# Patient Record
Sex: Female | Born: 1940 | Race: White | Hispanic: No | Smoking: Never smoker
Health system: Southern US, Community
[De-identification: ages and names within clinical notes are randomized; demographics above are authoritative.]

## PROBLEM LIST (undated history)

## (undated) DIAGNOSIS — M1990 Unspecified osteoarthritis, unspecified site: Secondary | ICD-10-CM

## (undated) DIAGNOSIS — Z8709 Personal history of other diseases of the respiratory system: Secondary | ICD-10-CM

## (undated) DIAGNOSIS — I509 Heart failure, unspecified: Secondary | ICD-10-CM

## (undated) DIAGNOSIS — K219 Gastro-esophageal reflux disease without esophagitis: Secondary | ICD-10-CM

## (undated) DIAGNOSIS — Z860100 Personal history of colon polyps, unspecified: Secondary | ICD-10-CM

## (undated) DIAGNOSIS — F419 Anxiety disorder, unspecified: Secondary | ICD-10-CM

## (undated) DIAGNOSIS — G5600 Carpal tunnel syndrome, unspecified upper limb: Secondary | ICD-10-CM

## (undated) DIAGNOSIS — R600 Localized edema: Secondary | ICD-10-CM

## (undated) DIAGNOSIS — M2550 Pain in unspecified joint: Secondary | ICD-10-CM

## (undated) DIAGNOSIS — IMO0001 Reserved for inherently not codable concepts without codable children: Secondary | ICD-10-CM

## (undated) DIAGNOSIS — J45909 Unspecified asthma, uncomplicated: Secondary | ICD-10-CM

## (undated) DIAGNOSIS — R609 Edema, unspecified: Secondary | ICD-10-CM

## (undated) DIAGNOSIS — E041 Nontoxic single thyroid nodule: Secondary | ICD-10-CM

## (undated) DIAGNOSIS — I272 Pulmonary hypertension, unspecified: Secondary | ICD-10-CM

## (undated) DIAGNOSIS — J679 Hypersensitivity pneumonitis due to unspecified organic dust: Secondary | ICD-10-CM

## (undated) HISTORY — PX: EYE SURGERY: SHX253

## (undated) HISTORY — DX: Unspecified asthma, uncomplicated: J45.909

## (undated) HISTORY — PX: ESOPHAGOGASTRODUODENOSCOPY: SHX1529

## (undated) HISTORY — PX: UMBILICAL HERNIA REPAIR: SHX196

## (undated) HISTORY — PX: OTHER SURGICAL HISTORY: SHX169

## (undated) HISTORY — PX: COLONOSCOPY: SHX174

## (undated) HISTORY — PX: KNEE ARTHROSCOPY W/ MENISCAL REPAIR: SHX1877

## (undated) HISTORY — DX: Nontoxic single thyroid nodule: E04.1

## (undated) HISTORY — DX: Carpal tunnel syndrome, unspecified upper limb: G56.00

## (undated) HISTORY — PX: BREAST CYST EXCISION: SHX579

## (undated) HISTORY — DX: Gastro-esophageal reflux disease without esophagitis: K21.9

## (undated) HISTORY — DX: Pain in unspecified joint: M25.50

---

## 1997-11-06 ENCOUNTER — Other Ambulatory Visit: Admission: RE | Admit: 1997-11-06 | Discharge: 1997-11-06 | Payer: Self-pay | Admitting: Obstetrics and Gynecology

## 1998-11-30 ENCOUNTER — Ambulatory Visit (HOSPITAL_BASED_OUTPATIENT_CLINIC_OR_DEPARTMENT_OTHER): Admission: RE | Admit: 1998-11-30 | Discharge: 1998-11-30 | Payer: Self-pay | Admitting: General Surgery

## 1999-08-23 ENCOUNTER — Emergency Department (HOSPITAL_COMMUNITY): Admission: EM | Admit: 1999-08-23 | Discharge: 1999-08-24 | Payer: Self-pay | Admitting: Emergency Medicine

## 1999-10-15 ENCOUNTER — Ambulatory Visit (HOSPITAL_COMMUNITY): Admission: RE | Admit: 1999-10-15 | Discharge: 1999-10-15 | Payer: Self-pay | Admitting: *Deleted

## 2004-04-21 ENCOUNTER — Ambulatory Visit: Payer: Self-pay | Admitting: *Deleted

## 2004-05-20 ENCOUNTER — Ambulatory Visit: Payer: Self-pay | Admitting: *Deleted

## 2007-02-06 ENCOUNTER — Ambulatory Visit: Payer: Self-pay | Admitting: Internal Medicine

## 2007-02-06 LAB — CONVERTED CEMR LAB
Basophils Absolute: 0.1 10*3/uL (ref 0.0–0.1)
Basophils Relative: 0.8 % (ref 0.0–1.0)
Eosinophils Absolute: 0.4 10*3/uL (ref 0.0–0.6)
Eosinophils Relative: 5.1 % — ABNORMAL HIGH (ref 0.0–5.0)
HCT: 39.8 % (ref 36.0–46.0)
Hemoglobin: 13.7 g/dL (ref 12.0–15.0)
Lymphocytes Relative: 35.8 % (ref 12.0–46.0)
MCHC: 34.6 g/dL (ref 30.0–36.0)
MCV: 88.9 fL (ref 78.0–100.0)
Monocytes Absolute: 0.8 10*3/uL — ABNORMAL HIGH (ref 0.2–0.7)
Monocytes Relative: 10.8 % (ref 3.0–11.0)
Neutro Abs: 3.5 10*3/uL (ref 1.4–7.7)
Neutrophils Relative %: 47.5 % (ref 43.0–77.0)
Platelets: 296 10*3/uL (ref 150–400)
Pro B Natriuretic peptide (BNP): 22 pg/mL (ref 0.0–100.0)
RBC: 4.47 M/uL (ref 3.87–5.11)
RDW: 13.1 % (ref 11.5–14.6)
Sed Rate: 14 mm/hr (ref 0–25)
WBC: 7.5 10*3/uL (ref 4.5–10.5)

## 2007-03-19 ENCOUNTER — Ambulatory Visit: Payer: Self-pay | Admitting: Internal Medicine

## 2007-03-19 LAB — CONVERTED CEMR LAB
BUN: 9 mg/dL (ref 6–23)
CO2: 32 meq/L (ref 19–32)
Calcium: 9.1 mg/dL (ref 8.4–10.5)
Chloride: 106 meq/L (ref 96–112)
Creatinine, Ser: 0.7 mg/dL (ref 0.4–1.2)
GFR calc Af Amer: 107 mL/min
GFR calc non Af Amer: 89 mL/min
Glucose, Bld: 94 mg/dL (ref 70–99)
Potassium: 4.1 meq/L (ref 3.5–5.1)
Sodium: 145 meq/L (ref 135–145)

## 2007-03-21 ENCOUNTER — Ambulatory Visit: Payer: Self-pay | Admitting: Cardiovascular Disease

## 2007-04-03 DIAGNOSIS — K219 Gastro-esophageal reflux disease without esophagitis: Secondary | ICD-10-CM | POA: Insufficient documentation

## 2007-04-03 DIAGNOSIS — E785 Hyperlipidemia, unspecified: Secondary | ICD-10-CM | POA: Insufficient documentation

## 2007-04-05 ENCOUNTER — Ambulatory Visit (HOSPITAL_COMMUNITY): Admission: RE | Admit: 2007-04-05 | Discharge: 2007-04-05 | Payer: Self-pay | Admitting: Internal Medicine

## 2007-04-19 ENCOUNTER — Ambulatory Visit: Payer: Self-pay | Admitting: Internal Medicine

## 2007-04-19 LAB — CONVERTED CEMR LAB: TSH: 1.25 microintl units/mL (ref 0.35–5.50)

## 2007-05-01 ENCOUNTER — Telehealth (INDEPENDENT_AMBULATORY_CARE_PROVIDER_SITE_OTHER): Payer: Self-pay | Admitting: *Deleted

## 2007-05-08 ENCOUNTER — Encounter: Admission: RE | Admit: 2007-05-08 | Discharge: 2007-05-08 | Payer: Self-pay | Admitting: Internal Medicine

## 2007-05-11 ENCOUNTER — Encounter: Admission: RE | Admit: 2007-05-11 | Discharge: 2007-05-11 | Payer: Self-pay | Admitting: Internal Medicine

## 2007-05-11 ENCOUNTER — Other Ambulatory Visit: Admission: RE | Admit: 2007-05-11 | Discharge: 2007-05-11 | Payer: Self-pay | Admitting: Interventional Radiology

## 2007-05-11 ENCOUNTER — Encounter (INDEPENDENT_AMBULATORY_CARE_PROVIDER_SITE_OTHER): Payer: Self-pay | Admitting: Interventional Radiology

## 2007-05-18 ENCOUNTER — Telehealth (INDEPENDENT_AMBULATORY_CARE_PROVIDER_SITE_OTHER): Payer: Self-pay | Admitting: *Deleted

## 2007-07-09 ENCOUNTER — Ambulatory Visit: Payer: Self-pay | Admitting: Internal Medicine

## 2007-07-09 ENCOUNTER — Encounter: Payer: Self-pay | Admitting: Internal Medicine

## 2007-07-09 DIAGNOSIS — M545 Low back pain, unspecified: Secondary | ICD-10-CM | POA: Insufficient documentation

## 2007-07-09 DIAGNOSIS — R0602 Shortness of breath: Secondary | ICD-10-CM | POA: Insufficient documentation

## 2007-07-09 DIAGNOSIS — J8410 Pulmonary fibrosis, unspecified: Secondary | ICD-10-CM | POA: Insufficient documentation

## 2007-07-09 LAB — PULMONARY FUNCTION TEST

## 2008-03-04 ENCOUNTER — Encounter: Admission: RE | Admit: 2008-03-04 | Discharge: 2008-03-27 | Payer: Self-pay | Admitting: Family Medicine

## 2008-03-31 ENCOUNTER — Encounter: Admission: RE | Admit: 2008-03-31 | Discharge: 2008-06-02 | Payer: Self-pay | Admitting: Family Medicine

## 2010-07-04 LAB — CONVERTED CEMR LAB
Bilirubin Urine: NEGATIVE
Hemoglobin, Urine: NEGATIVE
Ketones, ur: NEGATIVE mg/dL
Leukocytes, UA: NEGATIVE
Nitrite: NEGATIVE
Specific Gravity, Urine: <=1.005 (ref 1.000–1.03)
Total Protein, Urine: NEGATIVE mg/dL
Urine Glucose: NEGATIVE mg/dL
Urobilinogen, UA: 0.2 (ref 0.0–1.0)
pH: 6.5 (ref 5.0–8.0)

## 2010-07-08 NOTE — Assessment & Plan Note (Signed)
Summary: f/u/tfw   Visit Type:  Extended fu PCP:  Gus Height  Chief Complaint:  Pt here for rov with pft's today.  Overall breathing is well.[name]  History of Present Illness: 70 year old white female never smoker with possible pulmonary fibrosis associated symptomatically with "spells of dyspnea" that have happened for several years and this time after it occurred, did not return completely back to baseline when I saw her on February 06, 2007, and recommended an empiric trial of PPI therapy. I thought a unifying diagnosis might be intermittent reflux causing exacerbations of both airway upper and lower airways symptoms and strongly recommend she maintain PPI therapy as well as diet.  She comes in today chewing gum and admitting that she has not been consistent about using her PPI as recommended.however, compared to how she was doing prior to her visit in September she states she doing quite a bit better.  She denies any limiting dyspnea but did note that she felt just a little bit better after receiving albuterol in the PFT lab today, although she was nonspecific and terms of what way she felt better.  She denies any limiting dyspnea with the activities that she does daily, which does not include intense exertion or aerobics.Pt denies any significant sore throat, nasal congestion or excess secretions, fever, chills, sweats, unintended wt loss, pleuritic or exertional cp, orthopnea pnd or leg swelling.   Pt also denies any obvious fluctuation in symptoms with weather or environmental change or other alleviating or aggravating factors.     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       Family History:    tuberculosis in her father with multiple subsequent skin test by the patient negative    Alzheimer's and her mother chronic lung disease and several siblings who were smokers  Social History:    Patient never smoked.    Risk Factors:  Tobacco use:  never   Review of Systems  The patient denies anorexia, fever, weight loss, vision loss, decreased hearing, hoarseness, chest pain, syncope, dyspnea on exhertion, peripheral edema, prolonged cough, hemoptysis, abdominal pain, melena, hematochezia, severe indigestion/heartburn, hematuria, incontinence, muscle weakness, suspicious skin lesions, transient blindness, difficulty walking, depression, unusual weight change, abnormal bleeding, and enlarged lymph nodes.     Vital Signs:  Patient Profile:   70 Years Old Female Height:     67 inches Weight:      246 pounds O2 Sat:      97 % O2 treatment:    Room Air Temp:     98.4 degrees F oral Pulse rate:   80 / minute BP sitting:   142 / 80  (left arm)  Vitals Entered By: Tilden Dome (July 09, 2007 11:20 AM)                 Physical Exam  ambulatory  obese white female who cleared her throat frequently during the interview and exam and she demonstrated classic voice fatigue during the interview while chewing mint based gum. Afeb with normal vital signs HEENT: nl dentition, turbinates, and orophanx. Nl external ear canals without cough reflex Neck without JVD/Nodes/TM Lungs clear to A and P bilaterally without cough on insp or exp maneuvers RRR no s3 or murmur or increase in P2 Abd soft and benign with nl excursion in the supine position. 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  The fact she has a variable component of reversible airflow traction combined with definite response while on PPI therapy daily, combined with non-adherence to diet, all spells out a story that is very suggestive of intermittent chronic reflux, a very common condition and cause both upper and lower airway dysfunction as well as pulmonary fibrosis.  To prove this hypothesis correct I've asked her to be perfectly consistent about using Nexium every day 30 to 60 minutes before breakfast and observing carefully the diet that are reviewed with her.  This includes avoidance of all medications and mental products. I would like her to do this for a full 3 months before returning here.  She continues to have intermittent spells while on this therapy, I would not hesitate to start her on either Qvar or Symbicort, based on her positive reversible airflow traction demonstrated then affected these two inhalers are the least irritating to the upper airway. Orders: Est. Patient Level IV (19012)   Problem # 2:  LOW BACK PAIN SYNDROME (ICD-724.2) urinalysis was negative.  The patient mentioned this complaint as she was leaving and states that she feels she pulled a muscle left low back but wanted to be sure she did not have a urinary tract infection or stone.  I will pass this information along to Dr. Harrington Challenger Orders: TLB-Udip ONLY (81003-UDIP)   Other Orders: Est. Patient Level IV (22411)   Patient Instructions: 1)  Please schedule a follow-up appointment in 3 months with a chest xray to follow up lung scarring    ]  Appended Document: f/u/tfw Copy will be faxed to Dr. Harrington Challenger

## 2010-07-08 NOTE — Progress Notes (Signed)
Summary: QUESTIONS BEFORE UPCOMING BIOPSY AND LAB RESULTS  Phone Note Call from Patient   Caller: Patient Call For: WERT Summary of Call: PT IS HAVING BIOPSY AT [place] (NODULE ON THYROID - DR. WERT SET THIS UP FOR HER). SHE HAS QUESTIONS REGARDING THIS PROCEDURE AND ALSO WANTS TO KNOW HER RECENT LAB RESULTS. PH# [phone]. PATIENT'S CHART HAS BEEN REQUESTED Initial call taken by: Cooper Render,  May 01, 2007 11:10 AM  Follow-up for Phone Call        Spoke with pt wanted to know address and location of scheduled appt for FNA of her thyroid nodule. Advised scheduled at [facility].  Pt wanting to know who would be doing procedure advised either radiologist or PA gave pt # to [place] and advised her to call and could find out for sure.  Advised FastTSH done on 04/19/07 WNL. Follow-up by: Freddrick March,  May 01, 2007 3:46 PM

## 2010-07-08 NOTE — Miscellaneous (Signed)
Summary: Orders Update-PFTS  Clinical Lists Changes  Problems: Added new problem of DYSPNEA (ICD-786.05) Orders: Added new Service order of Carbon Monoxide diffusing w/capacity [phone]) - Signed Added new Service order of Lung Volumes (47654) - Signed Added new Service order of Spirometry (Pre & Post) [phone]) - Signed

## 2010-07-08 NOTE — Progress Notes (Signed)
Summary: biopsy results - pt wants call back  Phone Note Call from Patient Call back at Home Phone [phone]   Caller: Patient Call For: wert Reason for Call: Talk to Nurse, Lab or Test Results Summary of Call: pt looking for biopsy results.  Would like a call back today either way chart in wert area.  .................................................................Marland KitchenMarland KitchenZigmund Gottron  May 18, 2007 9:35 AM  Follow-up for Phone Call        Dr. Melvyn Novas, please call pt with bronch results.  She is anxious.  She is aware that you are out of the office until 03-21-07. Follow-up by: Tilden Dome,  May 18, 2007 11:57 AM  Additional Follow-up for Phone Call Additional follow up Details #1::        thyroid bx benign.  Se  Send copy of path report to Dr Loletha Grayer Melinda Crutch and set pt up for fu ov @ 3 mo with cxr and pfts dx 515 Additional Follow-up by: Tanda Rockers MD,  May 18, 2007 2:24 PM    Additional Follow-up for Phone Call Additional follow up Details #2::    Pt scheduled for pft and ov 07/09/07. Pt aware. Path report sent. Follow-up by: June Leap,  May 18, 2007 3:37 PM

## 2010-10-19 NOTE — Assessment & Plan Note (Signed)
[facility]                             PULMONARY OFFICE NOTE   [name], [name]                    MRN:          [id number]  DATE:02/06/2007                            DOB:          11-10-1940    REASON FOR CONSULTATION:  Dyspnea.   HISTORY:  A 70 year old white female with paroxysms of dyspnea dating  back several years, typically occurring during spells several times  per year, associated with chest tightness and dry cough.  She said she  had a typical episode a month ago and is about 75% improved but not  completely.  She continues to have somewhat of a dry daytime cough and  dyspnea with anything more than slow ADLs.  She was tried on inhalers,  which didn't work, and there is concern because her father had  tuberculosis that she is developing the same problems that he had.   The patient denies any fevers, chills, sweats, purulent sputum,  orthopnea, PND, overt sinus or reflux symptoms.  Past medical history is  significant, however, for reflux, status post EGD with stretching by Dr.  Michail Sermon within the last month, for which she was given Nexium but  didn't need to keep taking it.  She also has had knee surgery.   ALLERGIES:  SULFA.   MEDICATIONS:  None regularly.   SOCIAL HISTORY:  She has never smoked except as a teenager.   FAMILY HISTORY:  Significant for the fact that her father had  tuberculosis and several of his sisters were chronically ill with  respiratory disease, but they were smokers.  Other than her father, no  one in her direct family has had respiratory diseases.  She was skin-  tested then and every time checks negative and has never been treated  for tuberculosis.   REVIEW OF SYSTEMS:  Taken in detail on the worksheet and negative except  as outlined above.   PHYSICAL EXAMINATION:  This is a slightly anxious but pleasant  ambulatory white female in no acute distress.  She is afebrile with normal vital signs.  HEENT:  Oropharynx clear.  Dentition intact.  NECK:  Supple without cervical adenopathy or tenderness.  Trachea is  midline.  No thyromegaly.  Lung fields reveal slight coarsening of expiratory breath sounds but no  true wheeze.  There is no definite crackle on inspiration.  There is a regular rhythm without murmur, gallop, or rub or increase in  P2.  ABDOMEN:  Soft, benign, without any palpable organomegaly or mass.  EXTREMITIES:  Warm without calf tenderness, cyanosis, clubbing or edema.   Chest x-ray is reviewed from August 23 and she has a mild increase in  markings versus February 2007 chest x-ray, which is normal.   IMPRESSION:  1. This patient appears to have very mild fibrotic-looking changes on      chest x-ray but it does not really correlate with her history of      intermittent spells that occur with spells except for the fact      she is not 100% back to her baseline presently.  One unifying  diagnosis might be gastroesophageal reflux disease because she also      has enough esophagitis to warrant Nexium, albeit not long-term, and      because reflux and fibrosis are frequently related statistically if      not by cause and effect.   I did recommend a sedimentation rate, BNP and CBC with differential  today for baseline purposes and also a set of PFTs with a chest x-ray  here at 6 weeks.  In the meantime I would recommend she take Nexium not  p.r.n. but daily 30 minutes before breakfast and observe a GERD diet  closely.   The differential diagnosis, unfortunately, does include UIP.  Patients  with UIP frequently exacerbate several times per year during viral  syndromes, which is what the patient previously attributed her previous  flare-ups to, but this was the first time she did not return completely  to baseline symptomatically and therefore I think it is unlikely this is  UIP or any other form of progressive pulmonary fibrosis.     [name]. Melvyn Novas, MD, [facility]   Electronically Signed    MBW/MedQ  DD: 02/06/2007  DT: 02/07/2007  Job #: [id number]   cc:   C. Melinda Crutch, M.D.

## 2010-10-19 NOTE — Assessment & Plan Note (Signed)
[facility]                             PULMONARY OFFICE NOTE   [name], [name]                    MRN:          [id number]  DATE:03/19/2007                            DOB:          [birth year]    HISTORY:  This 70 year old white female with possible pulmonary fibrosis  associated symptomatically with spells of dyspnea that have happened  for several years and this time after it occurred, did not return  completely back to baseline when I saw her on February 06, 2007, and  recommended an empiric trial of PPI therapy.  She states she has been  consistent about doing this and also following diet, but only a little  bit better.  In addition, today she tells me she has been having aches  and stiffness in her joints for one year, but this is asymmetric,  sometimes in her shoulders, sometimes in her wrists and almost always in  her knees.  She denies any classic gel phenomenon or symmetric  stiffness in the mornings, fevers, chills, sweats, sputum production,  orthopnea, PND, leg swelling or change in bowel or bladder habits.   PHYSICAL EXAMINATION:  GENERAL:  She is a somewhat anxious, pleasant,  ambulatory obese white female, in no acute distress weighing 247 pounds  now, versus 244 pounds on February 06, 2007, identifying obesity as a  potential risk factor for reflux.  VITAL SIGNS:  Normal.  HEENT:  Unremarkable.  Pharynx clear.  LUNGS:  Fields reveal a cough on deep inspiratory maneuvers but no  definite crackles.  HEART:  A regular rhythm without murmur, gallop or rub.  ABDOMEN:  Soft, benign.  EXTREMITIES:  Warm without calf tenderness, cyanosis, clubbing or edema.   LABORATORY DATA:  From February 06, 2007, indicates a sedimentation rate  of 14.  Beta natriuretic peptide 22.  PFTs today are normal except a  reduction in diffusion capacity at 60%, which corrects to 120.  The most  dramatic finding is a reduction in expiratory reserve  volume, consistent  with the effects of obesity.   IMPRESSION/PLAN:  1. Obesity clearly is the most identifiable problem that both      contributes to cough and dyspnea, but of course does not occur in      spells.  What I am concerned about is the possibility that she      has underlying pulmonary fibrosis that indeed can occur with      spells of dyspnea that do not return to baseline and are      reflected in a reduced diffusion capacity, and recent chest x-ray      suggesting increased interstitial markings.  I therefore recommend      proceeding with a CT scan of the chest and sinuses, (looking for      other possible causes of cough, other than interstitial lung      disease), as soon as we can schedule it.  In the meantime, spend      extra time explaining to her the differential diagnosis and trying      to sort out whether or not collagen  vascular disease is present      (based on the sed rate being so low and the asymmetry of her      complaints without symmetrical typical joint stiffness, I think      collagen vascular disease is unlikely, but has not been excluded      here).  2. She does apparently have documented reflux.  She previously was      told only to take the PPI for one month.  Based on the fact that      she has a chronic lung problem that may be either caused by or      exacerbated by reflux, I am going to recommend that she maintain      the      PPI indefinitely until we have a firm baseline of serial PFTs and      we have excluded the possibility of significant fibrosing illness      here.     [name]. Joy Novas, MD, [facility]  Electronically Signed    MBW/MedQ  DD: 03/19/2007  DT: 03/20/2007  Job #: [id number]   cc:   [name], M.D.

## 2010-10-19 NOTE — Assessment & Plan Note (Signed)
[facility]                             PULMONARY OFFICE NOTE   MASIE, BERMINGHAM                    MRN:          [id number]  DATE:04/19/2007                            DOB:          [birth year]    HISTORY OF PRESENT ILLNESS:  This is a 70 year old pleasant female  patient of Dr. Gustavus Bryant with a known history of possible pulmonary  fibrosis.  Patient presents for an acute office visit.  Patient  complains of a five day history of productive cough with thick yellow-  green sputum, nasal congestion, sinus pain and pressure.  Patient denies  any hemoptysis, orthopnea, PND, or leg swelling.   PAST MEDICAL HISTORY:  Reviewed.   CURRENT MEDICATIONS:  Reviewed.   PHYSICAL EXAMINATION:  Patient is a pleasant female patient in no acute  distress.  She is afebrile with stable vital signs.  HEENT:  Nasal mucosa is erythematous.  Maxillary sinus tenderness to  percussion.  TM's are normal.  Posterior pharynx is clear.  NECK:  Supple without cervical adenopathy.  No JVD.  LUNGS:  The lung sounds have diffuse scattered rhonchi.  CARDIAC:  Regular rate.  ABDOMEN:  Soft and obese.  EXTREMITIES:  Warm without any edema.   IMPRESSION/PLAN:  Acute rhinosinusitis and bronchitis.  Patient is to  begin Omnicef x10 days.  Mucinex DM twice daily.  Nasal hygiene regimen  with saline Afrin nasal spray x5 days.  An instructional sheet given.  Patient is to return back with Dr. Melvyn Novas as scheduled or sooner as  needed.      Rexene Edison, NP  Electronically Signed      Christena Deem. Melvyn Novas, MD, [facility]  Electronically Signed   TP/MedQ  DD: 04/19/2007  DT: 04/20/2007  Job #: [id number]

## 2010-12-16 ENCOUNTER — Encounter: Payer: Self-pay | Admitting: Cardiology

## 2010-12-17 ENCOUNTER — Ambulatory Visit (INDEPENDENT_AMBULATORY_CARE_PROVIDER_SITE_OTHER): Payer: Medicare Other | Admitting: Cardiology

## 2010-12-17 ENCOUNTER — Encounter: Payer: Self-pay | Admitting: Cardiology

## 2010-12-17 DIAGNOSIS — E669 Obesity, unspecified: Secondary | ICD-10-CM

## 2010-12-17 DIAGNOSIS — R0602 Shortness of breath: Secondary | ICD-10-CM

## 2010-12-17 DIAGNOSIS — I10 Essential (primary) hypertension: Secondary | ICD-10-CM | POA: Insufficient documentation

## 2010-12-17 NOTE — Progress Notes (Signed)
HPI The patient presents for evaluation of dyspnea. She is followed by her primary provider and was noted to have increasing shortness of breath with some chest congestion recently. She didn't respond initially completely to a course of antibiotics but did feel somewhat better after steroids. She has had a second course of antibiotics and thinks she is improved though she still has some upper chest congestion. She will get dyspneic with some activities but she is dominantly limited by bilateral knee pain. She does do household chores but can't exercise because of knee pain. She is not describing chest pressure, neck or arm discomfort. She is not describing palpitations, presyncope or syncope. She has no PND or orthopnea. I did review previous records and in 2005 she had a negative stress perfusion study with [place]. Recent EKG was unremarkable.  Allergies  Allergen Reactions  . Adhesive (Tape)   . Sulfonamide Derivatives     Current Outpatient Prescriptions  Medication Sig Dispense Refill  . ALBUTEROL SULFATE IN Inhale into the lungs.        Marland Kitchen esomeprazole (NEXIUM) 40 MG capsule Take 40 mg by mouth daily before breakfast.        . furosemide (LASIX) 20 MG tablet Take 20 mg by mouth as needed.        . nitroGLYCERIN (NITROSTAT) 0.4 MG SL tablet Place 0.4 mg under the tongue every 5 (five) minutes as needed.        . promethazine-codeine (PHENERGAN WITH CODEINE) 6.25-10 MG/5ML syrup Take 5 mLs by mouth every 4 (four) hours as needed.          Past Medical History  Diagnosis Date  . GERD (gastroesophageal reflux disease)   . Multiple allergies   . Chest pain   . Edema     legs  . Depressive disorder   . Carpal tunnel syndrome   . Allergic rhinitis   . Diaphragmatic hernia without mention of obstruction or gangrene   . Joint pain   . Thyroid nodule   . Restrictive lung disease     Past Surgical History  Procedure Date  . Umbilical hernia repair   . Breast cyst excision   . Knee  arthroscopy w/ meniscal repair     left    Family History  Problem Relation Age of Onset  . Tuberculosis Father     History   Social History  . Marital Status: Divorced    Spouse Name: N/A    Number of Children: 2  . Years of Education: N/A   Occupational History  . Retired    Social History Main Topics  . Smoking status: Never Smoker   . Smokeless tobacco: Not on file  . Alcohol Use: Not on file  . Drug Use: Not on file  . Sexually Active: Not on file   Other Topics Concern  . Not on file   Social History Narrative  . No narrative on file    ROS:  Positive for reflux, allergies.  Otherwise as stated in the HPI and negative for all other systems.  PHYSICAL EXAM BP 158/86  Pulse 84  Ht _0  (1.702 m)  Wt 238 lb 6.4 oz (108.138 kg)  BMI 37.34 kg/m2 GENERAL:  Well appearing HEENT:  Pupils equal round and reactive, fundi not visualized, oral mucosa unremarkable NECK:  No jugular venous distention, waveform within normal limits, carotid upstroke brisk and symmetric, no bruits, no thyromegaly LYMPHATICS:  No cervical, inguinal adenopathy LUNGS:  Clear to auscultation bilaterally BACK:  No CVA tenderness CHEST:  Unremarkable HEART:  PMI not displaced or sustained,S1 and S2 within normal limits, no S3, no S4, no clicks, no rubs, no murmurs ABD:  Flat, positive bowel sounds normal in frequency in pitch, no bruits, no rebound, no guarding, no midline pulsatile mass, no hepatomegaly, no splenomegaly, obese EXT:  2 plus pulses throughout, no edema, no cyanosis no clubbing SKIN:  No rashes no nodules NEURO:  Cranial nerves II through XII grossly intact, motor grossly intact throughout PSYCH:  Cognitively intact, oriented to person place and time  EKG:  Sinus rhythm,  rate 84, axis within normal limits, intervals within normal limits, no acute ST-T wave changes.   ASSESSMENT AND PLAN

## 2010-12-17 NOTE — Assessment & Plan Note (Signed)
The patient understands the need to lose weight with diet and exercise. We have discussed specific strategies for this.

## 2010-12-17 NOTE — Assessment & Plan Note (Signed)
The blood pressure is at target. No change in medications is indicated. We will continue with therapeutic lifestyle changes (TLC).  

## 2010-12-17 NOTE — Patient Instructions (Signed)
Your physician has requested that you have an exercise tolerance test in 6 weeks. For further information please visit [email or web address]. Please also follow instruction sheet, as given.  Please continue all medications as listed.

## 2010-12-17 NOTE — Assessment & Plan Note (Signed)
I don't think there is a high pretest probability of this representing an anginal equivalent. It is probably related to deconditioning and some chronic pulmonary fibrosis that has been noted in the past. I do think treadmill testing is reasonable.  I will bring the patient back for a POET (Plain Old Exercise Test). This will allow me to screen for obstructive coronary disease, risk stratify and very importantly provide a prescription for exercise.

## 2011-02-04 ENCOUNTER — Ambulatory Visit (INDEPENDENT_AMBULATORY_CARE_PROVIDER_SITE_OTHER): Payer: Medicare Other | Admitting: Cardiology

## 2011-02-04 ENCOUNTER — Encounter: Payer: BLUE CROSS/BLUE SHIELD | Admitting: Cardiology

## 2011-02-04 DIAGNOSIS — R0602 Shortness of breath: Secondary | ICD-10-CM

## 2011-02-04 DIAGNOSIS — I10 Essential (primary) hypertension: Secondary | ICD-10-CM

## 2011-02-04 NOTE — Progress Notes (Signed)
Exercise Treadmill Test  Pre-Exercise Testing Evaluation Rhythm: normal sinus  Rate: 76   PR:  .15 QRS:  .09  QT:  .39 QTc: .44     Test  Exercise Tolerance Test Ordering MD: Marijo File, MD  Interpreting MD:  Marijo File, MD  Unique Test No: 1  Treadmill:  1  Indication for ETT: exertional dyspnea  Contraindication to ETT: No   Stress Modality: exercise - treadmill  Cardiac Imaging Performed: non   Protocol: modified Bruce - maximal  Max BP:  163/55  Max MPHR (bpm):  149 85% MPR (bpm):  122  MPHR obtained (bpm):  150 % MPHR obtained:  101  Reached 85% MPHR (min:sec):  1:15 Total Exercise Time (min-sec):  3:00  Workload in METS:  4.4 Borg Scale: 17  Reason ETT Terminated:  desired heart rate attained    ST Segment Analysis At Rest: normal ST segments - no evidence of significant ST depression With Exercise: no evidence of significant ST depression  Other Information Arrhythmia:  No Angina during ETT:  absent (0) Quality of ETT:  diagnostic  ETT Interpretation:  normal - no evidence of ischemia by ST analysis  Comments: The patient had an very poor exercise tolerance.  There was no chest pain.  There was an excessive level of dyspnea.  There were no arrhythmias, a normal heart rate response and normal BP response.  There were no ischemic ST T wave changes.  Recommendations: Negative adequate ETT.  No further testing is indicated.  Based on the above I gave the patient a prescription for exercise.  I suspect her limitations are pulmonary.  I might suggest follow up with a pulmonologist.

## 2011-07-28 DIAGNOSIS — J45909 Unspecified asthma, uncomplicated: Secondary | ICD-10-CM | POA: Diagnosis not present

## 2011-07-28 DIAGNOSIS — R05 Cough: Secondary | ICD-10-CM | POA: Diagnosis not present

## 2011-07-28 DIAGNOSIS — F430 Acute stress reaction: Secondary | ICD-10-CM | POA: Diagnosis not present

## 2011-07-28 DIAGNOSIS — R059 Cough, unspecified: Secondary | ICD-10-CM | POA: Diagnosis not present

## 2011-08-19 DIAGNOSIS — R05 Cough: Secondary | ICD-10-CM | POA: Diagnosis not present

## 2011-08-19 DIAGNOSIS — R059 Cough, unspecified: Secondary | ICD-10-CM | POA: Diagnosis not present

## 2011-08-19 DIAGNOSIS — Z111 Encounter for screening for respiratory tuberculosis: Secondary | ICD-10-CM | POA: Diagnosis not present

## 2011-09-13 DIAGNOSIS — J329 Chronic sinusitis, unspecified: Secondary | ICD-10-CM | POA: Diagnosis not present

## 2011-09-13 DIAGNOSIS — R059 Cough, unspecified: Secondary | ICD-10-CM | POA: Diagnosis not present

## 2011-09-13 DIAGNOSIS — R05 Cough: Secondary | ICD-10-CM | POA: Diagnosis not present

## 2011-11-14 DIAGNOSIS — J320 Chronic maxillary sinusitis: Secondary | ICD-10-CM | POA: Diagnosis not present

## 2011-11-14 DIAGNOSIS — R059 Cough, unspecified: Secondary | ICD-10-CM | POA: Diagnosis not present

## 2011-11-14 DIAGNOSIS — R05 Cough: Secondary | ICD-10-CM | POA: Diagnosis not present

## 2012-02-09 DIAGNOSIS — R05 Cough: Secondary | ICD-10-CM | POA: Diagnosis not present

## 2012-02-09 DIAGNOSIS — R059 Cough, unspecified: Secondary | ICD-10-CM | POA: Diagnosis not present

## 2012-02-28 DIAGNOSIS — R05 Cough: Secondary | ICD-10-CM | POA: Diagnosis not present

## 2012-02-28 DIAGNOSIS — E041 Nontoxic single thyroid nodule: Secondary | ICD-10-CM | POA: Diagnosis not present

## 2012-02-28 DIAGNOSIS — Z23 Encounter for immunization: Secondary | ICD-10-CM | POA: Diagnosis not present

## 2012-02-28 DIAGNOSIS — D510 Vitamin B12 deficiency anemia due to intrinsic factor deficiency: Secondary | ICD-10-CM | POA: Diagnosis not present

## 2012-02-28 DIAGNOSIS — Z13220 Encounter for screening for lipoid disorders: Secondary | ICD-10-CM | POA: Diagnosis not present

## 2012-02-28 DIAGNOSIS — R059 Cough, unspecified: Secondary | ICD-10-CM | POA: Diagnosis not present

## 2012-02-28 DIAGNOSIS — R5383 Other fatigue: Secondary | ICD-10-CM | POA: Diagnosis not present

## 2012-02-28 DIAGNOSIS — R5381 Other malaise: Secondary | ICD-10-CM | POA: Diagnosis not present

## 2012-02-28 DIAGNOSIS — K219 Gastro-esophageal reflux disease without esophagitis: Secondary | ICD-10-CM | POA: Diagnosis not present

## 2012-02-28 DIAGNOSIS — J984 Other disorders of lung: Secondary | ICD-10-CM | POA: Diagnosis not present

## 2012-02-28 DIAGNOSIS — Z136 Encounter for screening for cardiovascular disorders: Secondary | ICD-10-CM | POA: Diagnosis not present

## 2012-02-29 ENCOUNTER — Other Ambulatory Visit: Payer: Self-pay | Admitting: Family Medicine

## 2012-02-29 DIAGNOSIS — E041 Nontoxic single thyroid nodule: Secondary | ICD-10-CM

## 2012-03-20 ENCOUNTER — Ambulatory Visit
Admission: RE | Admit: 2012-03-20 | Discharge: 2012-03-20 | Disposition: A | Discharge status: Home / Self Care | Payer: Medicare Other | Source: Ambulatory Visit | Attending: Family Medicine | Admitting: Family Medicine

## 2012-03-20 DIAGNOSIS — E041 Nontoxic single thyroid nodule: Secondary | ICD-10-CM

## 2012-04-26 DIAGNOSIS — M999 Biomechanical lesion, unspecified: Secondary | ICD-10-CM | POA: Diagnosis not present

## 2012-04-26 DIAGNOSIS — S339XXA Sprain of unspecified parts of lumbar spine and pelvis, initial encounter: Secondary | ICD-10-CM | POA: Diagnosis not present

## 2012-04-30 DIAGNOSIS — M999 Biomechanical lesion, unspecified: Secondary | ICD-10-CM | POA: Diagnosis not present

## 2012-04-30 DIAGNOSIS — S339XXA Sprain of unspecified parts of lumbar spine and pelvis, initial encounter: Secondary | ICD-10-CM | POA: Diagnosis not present

## 2012-05-02 DIAGNOSIS — S339XXA Sprain of unspecified parts of lumbar spine and pelvis, initial encounter: Secondary | ICD-10-CM | POA: Diagnosis not present

## 2012-05-02 DIAGNOSIS — M999 Biomechanical lesion, unspecified: Secondary | ICD-10-CM | POA: Diagnosis not present

## 2012-05-11 DIAGNOSIS — E559 Vitamin D deficiency, unspecified: Secondary | ICD-10-CM | POA: Diagnosis not present

## 2012-05-11 DIAGNOSIS — R059 Cough, unspecified: Secondary | ICD-10-CM | POA: Diagnosis not present

## 2012-05-11 DIAGNOSIS — R05 Cough: Secondary | ICD-10-CM | POA: Diagnosis not present

## 2012-05-11 DIAGNOSIS — J45909 Unspecified asthma, uncomplicated: Secondary | ICD-10-CM | POA: Diagnosis not present

## 2012-05-21 DIAGNOSIS — R05 Cough: Secondary | ICD-10-CM | POA: Diagnosis not present

## 2012-05-21 DIAGNOSIS — M542 Cervicalgia: Secondary | ICD-10-CM | POA: Diagnosis not present

## 2012-05-21 DIAGNOSIS — R059 Cough, unspecified: Secondary | ICD-10-CM | POA: Diagnosis not present

## 2012-05-21 DIAGNOSIS — J984 Other disorders of lung: Secondary | ICD-10-CM | POA: Diagnosis not present

## 2012-07-12 DIAGNOSIS — M25569 Pain in unspecified knee: Secondary | ICD-10-CM | POA: Diagnosis not present

## 2012-07-12 DIAGNOSIS — J309 Allergic rhinitis, unspecified: Secondary | ICD-10-CM | POA: Diagnosis not present

## 2012-08-02 DIAGNOSIS — J329 Chronic sinusitis, unspecified: Secondary | ICD-10-CM | POA: Diagnosis not present

## 2012-08-02 DIAGNOSIS — J309 Allergic rhinitis, unspecified: Secondary | ICD-10-CM | POA: Diagnosis not present

## 2012-08-02 DIAGNOSIS — R51 Headache: Secondary | ICD-10-CM | POA: Diagnosis not present

## 2012-09-07 DIAGNOSIS — L538 Other specified erythematous conditions: Secondary | ICD-10-CM | POA: Diagnosis not present

## 2012-09-07 DIAGNOSIS — L84 Corns and callosities: Secondary | ICD-10-CM | POA: Diagnosis not present

## 2012-09-07 DIAGNOSIS — L820 Inflamed seborrheic keratosis: Secondary | ICD-10-CM | POA: Diagnosis not present

## 2012-09-07 DIAGNOSIS — D2330 Other benign neoplasm of skin of unspecified part of face: Secondary | ICD-10-CM | POA: Diagnosis not present

## 2012-09-07 DIAGNOSIS — L570 Actinic keratosis: Secondary | ICD-10-CM | POA: Diagnosis not present

## 2012-09-07 DIAGNOSIS — L819 Disorder of pigmentation, unspecified: Secondary | ICD-10-CM | POA: Diagnosis not present

## 2012-09-07 DIAGNOSIS — L821 Other seborrheic keratosis: Secondary | ICD-10-CM | POA: Diagnosis not present

## 2012-10-04 DIAGNOSIS — R609 Edema, unspecified: Secondary | ICD-10-CM | POA: Diagnosis not present

## 2012-10-04 DIAGNOSIS — R040 Epistaxis: Secondary | ICD-10-CM | POA: Diagnosis not present

## 2012-11-02 DIAGNOSIS — M1710 Unilateral primary osteoarthritis, unspecified knee: Secondary | ICD-10-CM | POA: Diagnosis not present

## 2012-11-02 DIAGNOSIS — IMO0002 Reserved for concepts with insufficient information to code with codable children: Secondary | ICD-10-CM | POA: Diagnosis not present

## 2012-11-05 DIAGNOSIS — I10 Essential (primary) hypertension: Secondary | ICD-10-CM | POA: Diagnosis not present

## 2012-11-05 DIAGNOSIS — J069 Acute upper respiratory infection, unspecified: Secondary | ICD-10-CM | POA: Diagnosis not present

## 2012-11-13 DIAGNOSIS — R350 Frequency of micturition: Secondary | ICD-10-CM | POA: Diagnosis not present

## 2012-12-06 DIAGNOSIS — L820 Inflamed seborrheic keratosis: Secondary | ICD-10-CM | POA: Diagnosis not present

## 2012-12-06 DIAGNOSIS — I789 Disease of capillaries, unspecified: Secondary | ICD-10-CM | POA: Diagnosis not present

## 2012-12-06 DIAGNOSIS — L819 Disorder of pigmentation, unspecified: Secondary | ICD-10-CM | POA: Diagnosis not present

## 2012-12-06 DIAGNOSIS — L821 Other seborrheic keratosis: Secondary | ICD-10-CM | POA: Diagnosis not present

## 2012-12-06 DIAGNOSIS — L570 Actinic keratosis: Secondary | ICD-10-CM | POA: Diagnosis not present

## 2013-01-02 DIAGNOSIS — R040 Epistaxis: Secondary | ICD-10-CM | POA: Diagnosis not present

## 2013-01-03 DIAGNOSIS — IMO0002 Reserved for concepts with insufficient information to code with codable children: Secondary | ICD-10-CM | POA: Diagnosis not present

## 2013-01-03 DIAGNOSIS — M1710 Unilateral primary osteoarthritis, unspecified knee: Secondary | ICD-10-CM | POA: Diagnosis not present

## 2013-01-08 DIAGNOSIS — R040 Epistaxis: Secondary | ICD-10-CM | POA: Diagnosis not present

## 2013-01-14 DIAGNOSIS — M1710 Unilateral primary osteoarthritis, unspecified knee: Secondary | ICD-10-CM | POA: Diagnosis not present

## 2013-01-14 DIAGNOSIS — IMO0002 Reserved for concepts with insufficient information to code with codable children: Secondary | ICD-10-CM | POA: Diagnosis not present

## 2013-01-21 DIAGNOSIS — E7800 Pure hypercholesterolemia, unspecified: Secondary | ICD-10-CM | POA: Diagnosis not present

## 2013-01-21 DIAGNOSIS — F430 Acute stress reaction: Secondary | ICD-10-CM | POA: Diagnosis not present

## 2013-01-21 DIAGNOSIS — M25569 Pain in unspecified knee: Secondary | ICD-10-CM | POA: Diagnosis not present

## 2013-01-21 DIAGNOSIS — IMO0002 Reserved for concepts with insufficient information to code with codable children: Secondary | ICD-10-CM | POA: Diagnosis not present

## 2013-01-21 DIAGNOSIS — M1710 Unilateral primary osteoarthritis, unspecified knee: Secondary | ICD-10-CM | POA: Diagnosis not present

## 2013-01-30 DIAGNOSIS — IMO0002 Reserved for concepts with insufficient information to code with codable children: Secondary | ICD-10-CM | POA: Diagnosis not present

## 2013-01-30 DIAGNOSIS — M1710 Unilateral primary osteoarthritis, unspecified knee: Secondary | ICD-10-CM | POA: Diagnosis not present

## 2013-02-06 DIAGNOSIS — IMO0002 Reserved for concepts with insufficient information to code with codable children: Secondary | ICD-10-CM | POA: Diagnosis not present

## 2013-02-06 DIAGNOSIS — M1710 Unilateral primary osteoarthritis, unspecified knee: Secondary | ICD-10-CM | POA: Diagnosis not present

## 2013-02-11 DIAGNOSIS — IMO0002 Reserved for concepts with insufficient information to code with codable children: Secondary | ICD-10-CM | POA: Diagnosis not present

## 2013-02-11 DIAGNOSIS — M1710 Unilateral primary osteoarthritis, unspecified knee: Secondary | ICD-10-CM | POA: Diagnosis not present

## 2013-04-24 DIAGNOSIS — E559 Vitamin D deficiency, unspecified: Secondary | ICD-10-CM | POA: Diagnosis not present

## 2013-04-24 DIAGNOSIS — Z23 Encounter for immunization: Secondary | ICD-10-CM | POA: Diagnosis not present

## 2013-04-24 DIAGNOSIS — J329 Chronic sinusitis, unspecified: Secondary | ICD-10-CM | POA: Diagnosis not present

## 2013-04-24 DIAGNOSIS — Z131 Encounter for screening for diabetes mellitus: Secondary | ICD-10-CM | POA: Diagnosis not present

## 2013-04-24 DIAGNOSIS — E041 Nontoxic single thyroid nodule: Secondary | ICD-10-CM | POA: Diagnosis not present

## 2013-04-24 DIAGNOSIS — E7800 Pure hypercholesterolemia, unspecified: Secondary | ICD-10-CM | POA: Diagnosis not present

## 2013-05-14 IMAGING — US US SOFT TISSUE HEAD/NECK
1 series · 14 of 25 positions shown · non-contrast
Comparison: 04/05/2007

CLINICAL DATA: Follow-up thyroid nodule.

THYROID ULTRASOUND
TECHNIQUE: Ultrasound examination of the thyroid gland and adjacent
soft tissues was performed.

[Series 1: us soft tissue head/neck · 0.07mm/px · 14 of 53 slices shown]
[im 1/53]
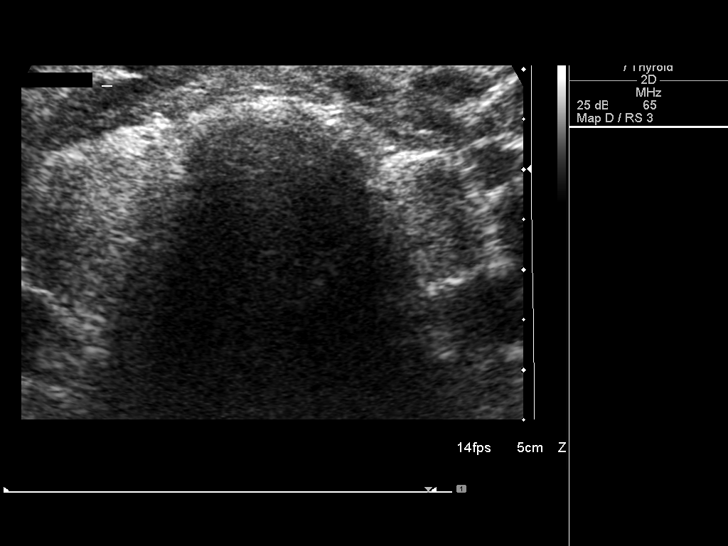
[im 5/53]
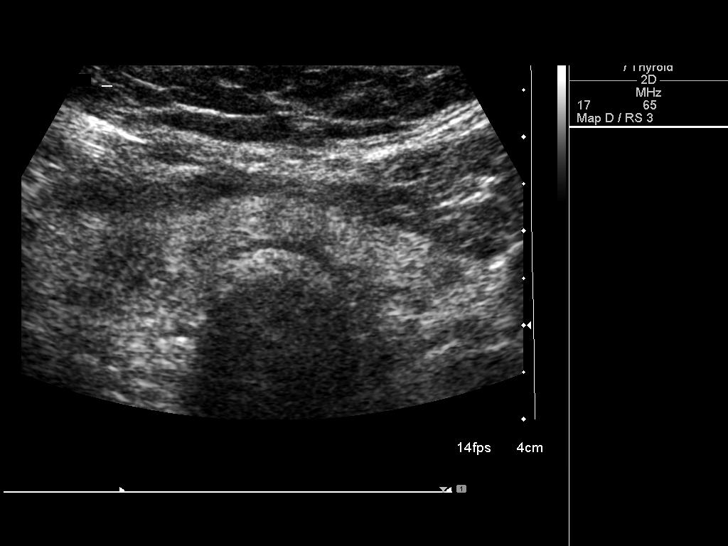
[im 9/53]
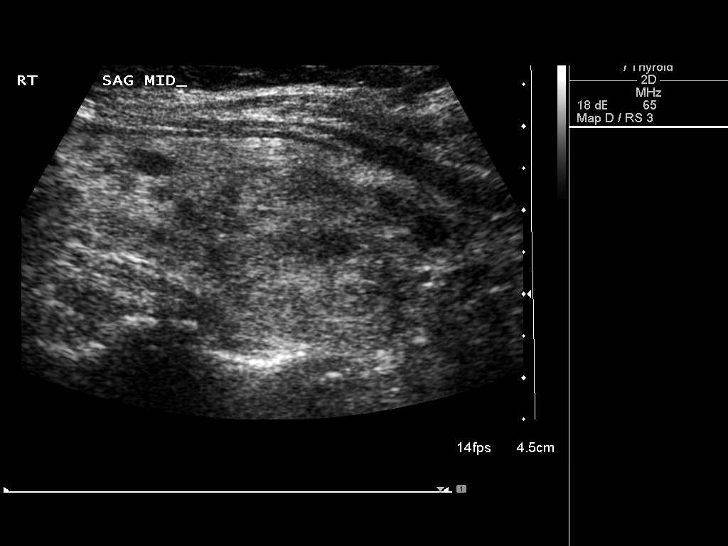
[im 14/53]
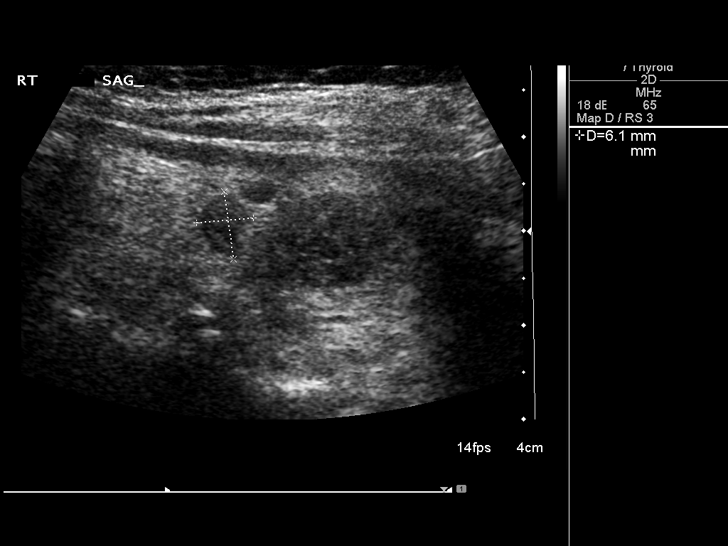
[im 18/53]
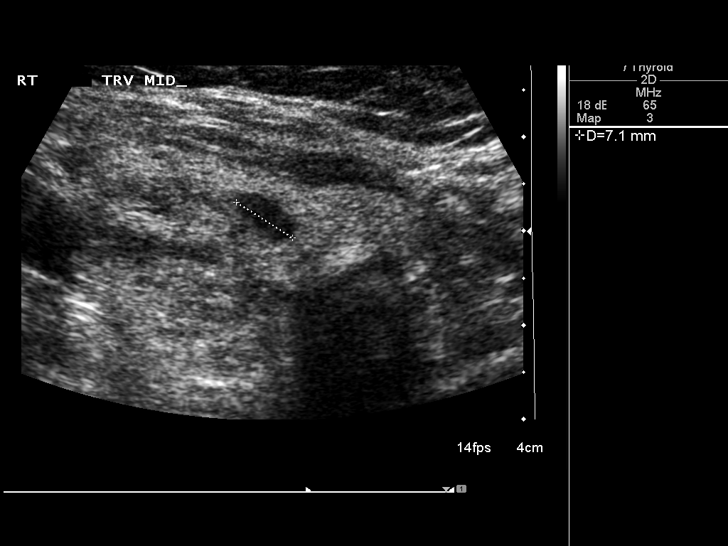
[im 20/53]
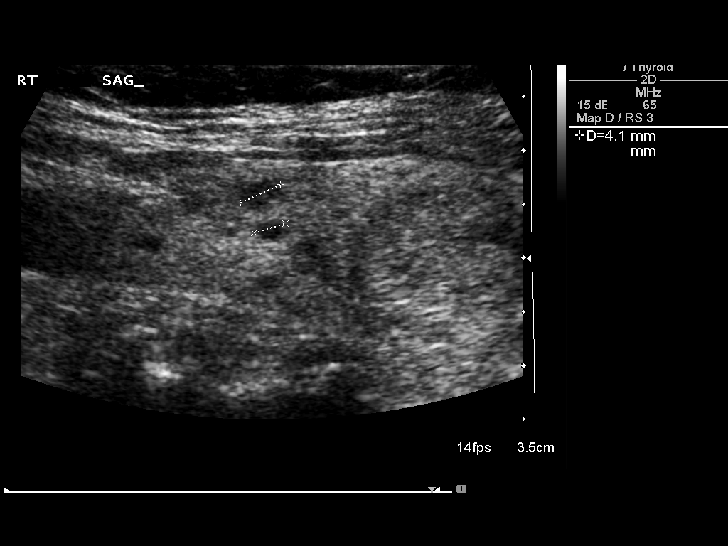
[im 24/53]
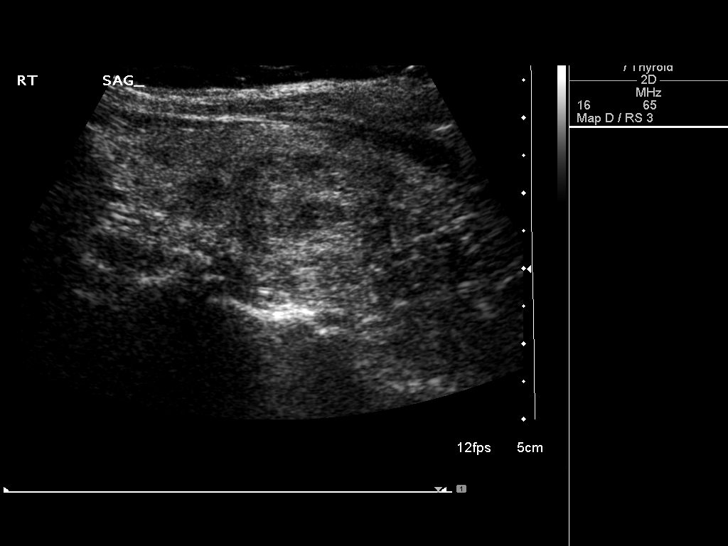
[im 29/53]
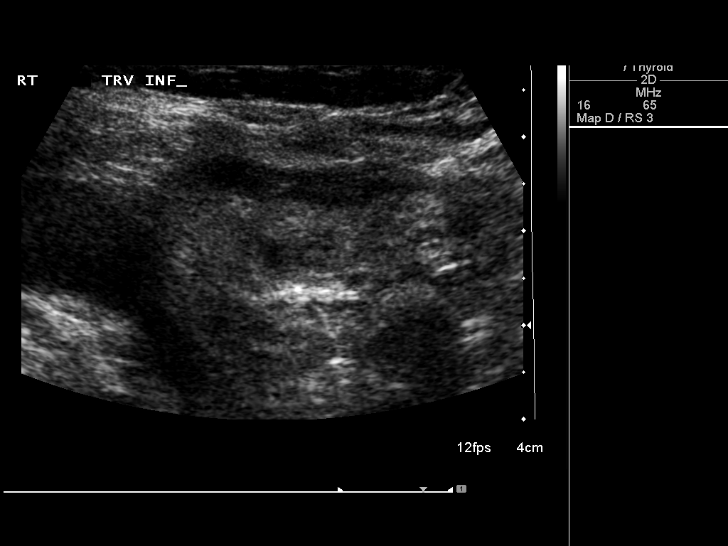
[im 33/53]
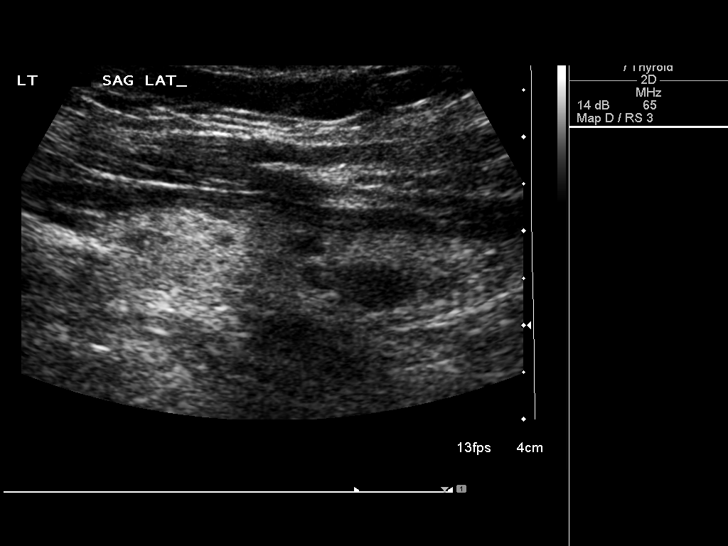
[im 35/53]
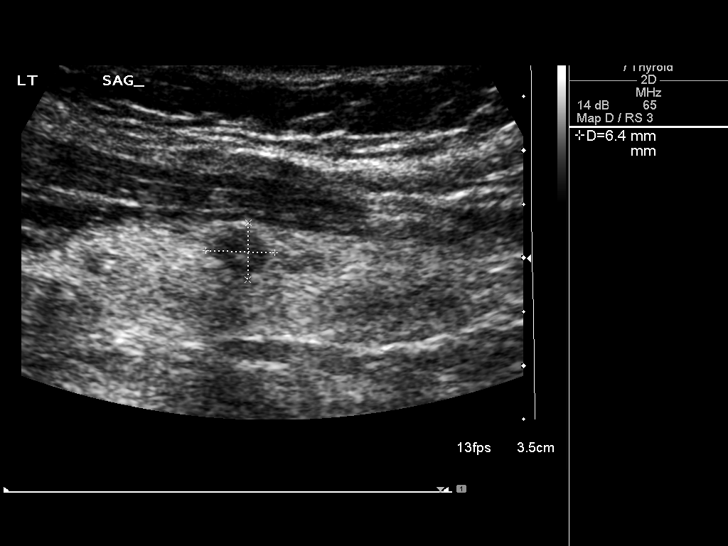
[im 40/53]
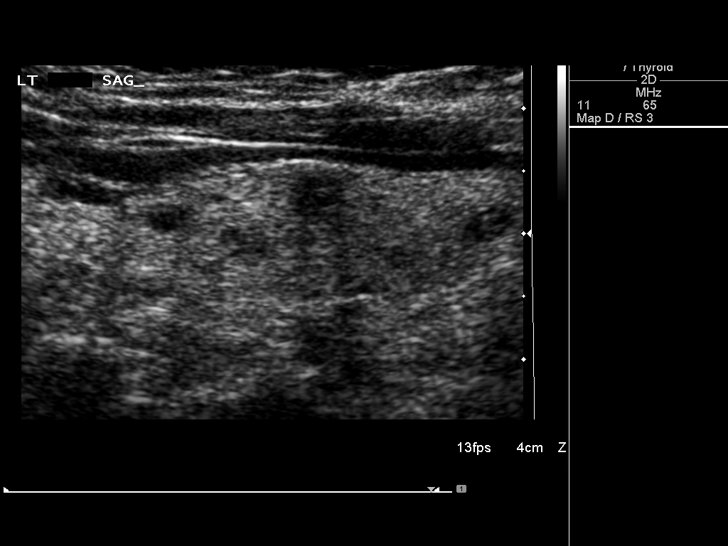
[im 44/53]
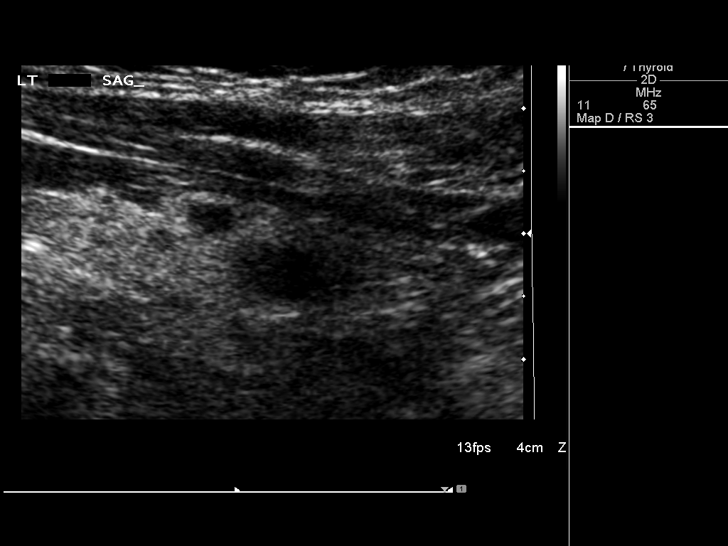
[im 48/53]
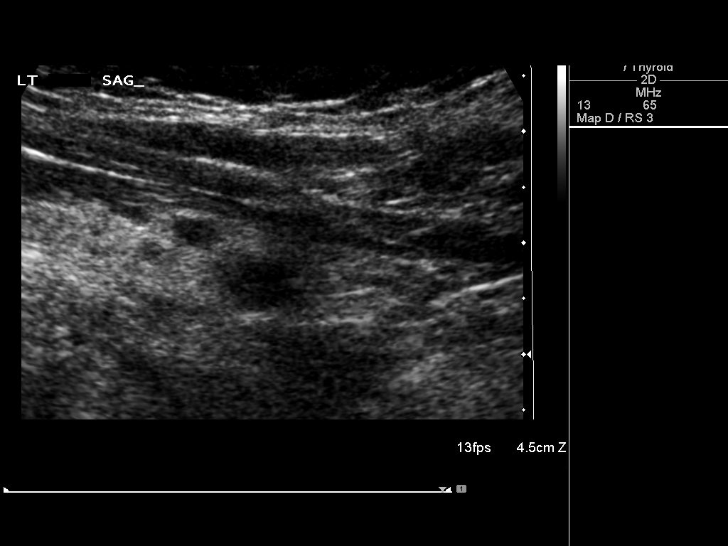
[im 53/53]
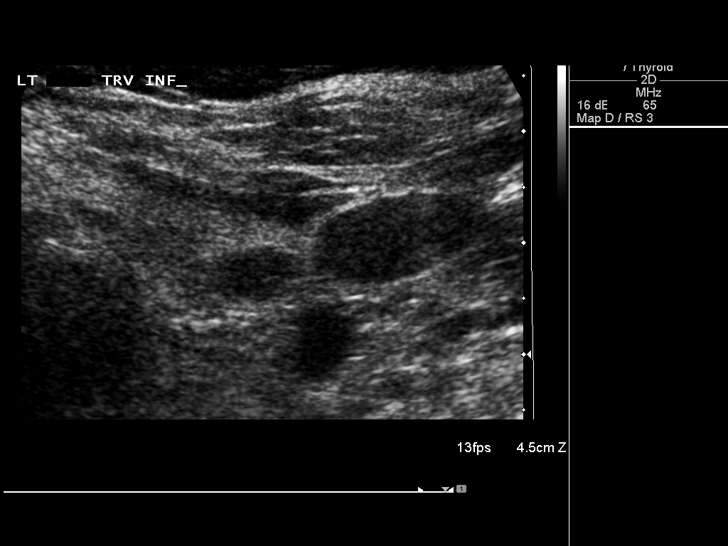

[14 of 25 positions shown; findings below may reference images not displayed]

FINDINGS: Right thyroid lobe:  5.3 x 2.4 x 2.5 cm.
Left thyroid lobe:  4.9 x 1.2 x 1.8 cm.
Isthmus:  4 mm

Focal nodules:  Several scattered thyroid nodules.  The largest is
in the right mid pole measuring up to 2.5 cm, stable since prior
study.  Multiple subcentimeteric thyroid nodules scattered
throughout the thyroid gland.

Lymphadenopathy:  None visualized.
IMPRESSION: Stable right thyroid nodule measuring up to 2.5 cm in greatest
diameter.

## 2013-05-16 DIAGNOSIS — R05 Cough: Secondary | ICD-10-CM | POA: Diagnosis not present

## 2013-05-16 DIAGNOSIS — R059 Cough, unspecified: Secondary | ICD-10-CM | POA: Diagnosis not present

## 2013-06-13 DIAGNOSIS — L821 Other seborrheic keratosis: Secondary | ICD-10-CM | POA: Diagnosis not present

## 2013-07-19 DIAGNOSIS — J3489 Other specified disorders of nose and nasal sinuses: Secondary | ICD-10-CM | POA: Diagnosis not present

## 2013-07-19 DIAGNOSIS — IMO0002 Reserved for concepts with insufficient information to code with codable children: Secondary | ICD-10-CM | POA: Diagnosis not present

## 2013-07-19 DIAGNOSIS — M1710 Unilateral primary osteoarthritis, unspecified knee: Secondary | ICD-10-CM | POA: Diagnosis not present

## 2013-07-26 DIAGNOSIS — IMO0002 Reserved for concepts with insufficient information to code with codable children: Secondary | ICD-10-CM | POA: Diagnosis not present

## 2013-07-26 DIAGNOSIS — M1710 Unilateral primary osteoarthritis, unspecified knee: Secondary | ICD-10-CM | POA: Diagnosis not present

## 2013-08-05 ENCOUNTER — Encounter (HOSPITAL_COMMUNITY): Payer: Self-pay | Admitting: Pharmacy Technician

## 2013-08-07 ENCOUNTER — Other Ambulatory Visit (HOSPITAL_COMMUNITY): Payer: Self-pay | Admitting: Orthopedic Surgery

## 2013-08-07 ENCOUNTER — Encounter (HOSPITAL_COMMUNITY): Payer: Self-pay

## 2013-08-07 ENCOUNTER — Encounter (HOSPITAL_COMMUNITY)
Admission: RE | Admit: 2013-08-07 | Discharge: 2013-08-07 | Disposition: A | Discharge status: Home / Self Care | Payer: Medicare Other | Source: Ambulatory Visit | Attending: Orthopedic Surgery | Admitting: Orthopedic Surgery

## 2013-08-07 ENCOUNTER — Other Ambulatory Visit (HOSPITAL_COMMUNITY): Payer: Medicare Other

## 2013-08-07 ENCOUNTER — Ambulatory Visit (HOSPITAL_COMMUNITY)
Admission: RE | Admit: 2013-08-07 | Discharge: 2013-08-07 | Disposition: A | Discharge status: Home / Self Care | Payer: Medicare Other | Source: Ambulatory Visit | Attending: Orthopedic Surgery | Admitting: Orthopedic Surgery

## 2013-08-07 DIAGNOSIS — Z01812 Encounter for preprocedural laboratory examination: Secondary | ICD-10-CM | POA: Diagnosis not present

## 2013-08-07 DIAGNOSIS — Z01810 Encounter for preprocedural cardiovascular examination: Secondary | ICD-10-CM | POA: Insufficient documentation

## 2013-08-07 DIAGNOSIS — Z01818 Encounter for other preprocedural examination: Secondary | ICD-10-CM | POA: Insufficient documentation

## 2013-08-07 HISTORY — DX: Unspecified osteoarthritis, unspecified site: M19.90

## 2013-08-07 HISTORY — DX: Anxiety disorder, unspecified: F41.9

## 2013-08-07 LAB — URINALYSIS, ROUTINE W REFLEX MICROSCOPIC
Bilirubin Urine: NEGATIVE
Glucose, UA: NEGATIVE mg/dL
Hgb urine dipstick: NEGATIVE
Ketones, ur: NEGATIVE mg/dL
Nitrite: NEGATIVE
Protein, ur: NEGATIVE mg/dL
Specific Gravity, Urine: 1.012 (ref 1.005–1.030)
Urobilinogen, UA: 0.2 mg/dL (ref 0.0–1.0)
pH: 7.5 (ref 5.0–8.0)

## 2013-08-07 LAB — URINE MICROSCOPIC-ADD ON

## 2013-08-07 LAB — CBC
HCT: 42.6 % (ref 36.0–46.0)
Hemoglobin: 14.2 g/dL (ref 12.0–15.0)
MCH: 29.3 pg (ref 26.0–34.0)
MCHC: 33.3 g/dL (ref 30.0–36.0)
MCV: 87.8 fL (ref 78.0–100.0)
Platelets: 300 10*3/uL (ref 150–400)
RBC: 4.85 MIL/uL (ref 3.87–5.11)
RDW: 14.6 % (ref 11.5–15.5)
WBC: 10 10*3/uL (ref 4.0–10.5)

## 2013-08-07 LAB — BASIC METABOLIC PANEL
BUN: 11 mg/dL (ref 6–23)
CO2: 28 mEq/L (ref 19–32)
Calcium: 9.7 mg/dL (ref 8.4–10.5)
Chloride: 100 mEq/L (ref 96–112)
Creatinine, Ser: 0.58 mg/dL (ref 0.50–1.10)
GFR calc Af Amer: >90 mL/min (ref >90)
GFR calc non Af Amer: 89 mL/min — ABNORMAL LOW (ref >90)
Glucose, Bld: 94 mg/dL (ref 70–99)
Potassium: 4.4 mEq/L (ref 3.7–5.3)
Sodium: 139 mEq/L (ref 137–147)

## 2013-08-07 LAB — SURGICAL PCR SCREEN
MRSA, PCR: NEGATIVE
Staphylococcus aureus: NEGATIVE

## 2013-08-07 LAB — APTT: aPTT: 28 seconds (ref 24–37)

## 2013-08-07 LAB — PROTIME-INR
INR: 0.94 (ref 0.00–1.49)
Prothrombin Time: 12.4 seconds (ref 11.6–15.2)

## 2013-08-07 NOTE — Progress Notes (Signed)
Abnormal CXR faxed to Dr. Alvan Dame

## 2013-08-07 NOTE — H&P (Signed)
TOTAL KNEE ADMISSION H&P  Patient is being admitted for left total knee arthroplasty.  Subjective:  Chief Complaint:      Left knee OA / pain.  HPI: [name], 73 y.o. female, has a history of pain and functional disability in the left knee due to arthritis and has failed non-surgical conservative treatments for greater than 12 weeks to includecorticosteriod injections, viscosupplementation injections and activity modification.  Onset of symptoms was gradual, starting >10 years ago with gradually worsening course since that time. The patient noted prior procedures on the knee to include  arthroscopy on the left knee(s).  Patient currently rates pain in the left knee(s) at 10 out of 10 with activity. Patient has night pain, worsening of pain with activity and weight bearing, pain that interferes with activities of daily living, pain with passive range of motion, crepitus and joint swelling.  Patient has evidence of periarticular osteophytes and joint space narrowing by imaging studies. There is no active infection.  Risks, benefits and expectations were discussed with the patient.  Risks including but not limited to the risk of anesthesia, blood clots, nerve damage, blood vessel damage, failure of the prosthesis, infection and up to and including death.  Patient understand the risks, benefits and expectations and wishes to proceed with surgery.   D/C Plans:   Home with HHPT  Post-op Meds:     No Rx given   Tranexamic Acid:   To be given  Decadron:    Not to be given - DM  FYI:    ASA post-op  Norco post-op   Patient Active Problem List   Diagnosis Date Noted  . Obesity 12/17/2010  . HTN (hypertension) 12/17/2010  . PULMONARY FIBROSIS ILD POST INFLAMMATORY CHRONIC 07/09/2007  . LOW BACK PAIN SYNDROME 07/09/2007  . DYSPNEA 07/09/2007  . HYPERLIPIDEMIA 04/03/2007  . GERD 04/03/2007   Past Medical History  Diagnosis Date  . GERD (gastroesophageal reflux disease)   . Chest pain    . Edema     legs  . Allergic rhinitis   . Joint pain   . Thyroid nodule   . Restrictive lung disease   . Numbness     FINGERS RT HAND  . Arthritis   . Anxiety     Past Surgical History  Procedure Laterality Date  . Umbilical hernia repair    . Breast cyst excision    . Knee arthroscopy w/ meniscal repair      left  . Carpell tunnell surg      No prescriptions prior to admission   Allergies  Allergen Reactions  . Adhesive [Tape]     Rash  . Sulfonamide Derivatives     Unknown    History  Substance Use Topics  . Smoking status: Never Smoker   . Smokeless tobacco: Not on file  . Alcohol Use: No    Family History  Problem Relation Age of Onset  . Tuberculosis Father      Review of Systems  Constitutional: Negative.   HENT: Negative.   Eyes: Negative.   Respiratory: Positive for cough.   Cardiovascular: Negative.   Gastrointestinal: Positive for heartburn.  Genitourinary: Negative.   Musculoskeletal: Positive for back pain and joint pain.  Skin: Negative.   Neurological: Negative.   Endo/Heme/Allergies: Positive for environmental allergies.  Psychiatric/Behavioral: Negative.     Objective:  Physical Exam  Constitutional: She is oriented to person, place, and time. She appears well-developed and well-nourished.  HENT:  Head: Normocephalic and atraumatic.  Mouth/Throat: Oropharynx is clear and moist.  Eyes: Pupils are equal, round, and reactive to light.  Neck: Neck supple. No JVD present. No tracheal deviation present. No thyromegaly present.  Cardiovascular: Normal rate, regular rhythm, normal heart sounds and intact distal pulses.   Respiratory: Effort normal and breath sounds normal. No stridor. No respiratory distress. She has no wheezes.  GI: Soft. There is no tenderness. There is no guarding.  Musculoskeletal:       Left knee: She exhibits decreased range of motion, swelling and bony tenderness. She exhibits no ecchymosis, no deformity, no  laceration and no erythema. Tenderness found.  Lymphadenopathy:    She has no cervical adenopathy.  Neurological: She is alert and oriented to person, place, and time.  Skin: Skin is warm and dry.  Psychiatric: She has a normal mood and affect.     Labs:  Estimated body mass index is 37.33 kg/(m^2) as calculated from the following:   Height as of 12/17/10: _0  (1.702 m).   Weight as of 12/17/10: 108.138 kg (238 lb 6.4 oz).   Imaging Review Plain radiographs demonstrate severe degenerative joint disease of the left knee(s). The overall alignment is neutral. The bone quality appears to be good for age and reported activity level.  Assessment/Plan:  End stage arthritis, left knee   The patient history, physical examination, clinical judgment of the provider and imaging studies are consistent with end stage degenerative joint disease of the left knee(s) and total knee arthroplasty is deemed medically necessary. The treatment options including medical management, injection therapy arthroscopy and arthroplasty were discussed at length. The risks and benefits of total knee arthroplasty were presented and reviewed. The risks due to aseptic loosening, infection, stiffness, patella tracking problems, thromboembolic complications and other imponderables were discussed. The patient acknowledged the explanation, agreed to proceed with the plan and consent was signed. Patient is being admitted for inpatient treatment for surgery, pain control, PT, OT, prophylactic antibiotics, VTE prophylaxis, progressive ambulation and ADL's and discharge planning. The patient is planning to be discharged home with home health services.     [place] Pugh Jarren Para   PAC  08/07/2013, 4:24 PM

## 2013-08-07 NOTE — Patient Instructions (Addendum)
[name]  08/07/2013                           YOUR PROCEDURE IS SCHEDULED ON: 08/13/13               PLEASE REPORT TO SHORT STAY CENTER AT : 10:30 AM               CALL THIS NUMBER IF ANY PROBLEMS THE DAY OF SURGERY :               [phone]                                REMEMBER:   Do not eat food or drink liquids AFTER MIDNIGHT   May have clear liquids UNTIL 6 HOURS BEFORE SURGERY (7:30 AM)               Take these medicines the morning of surgery with A SIP OF WATER: NONE   Do not wear jewelry, make-up   Do not wear lotions, powders, or perfumes.   Do not shave legs or underarms 12 hrs. before surgery (men may shave face)  Do not bring valuables to the hospital.  Contacts, dentures or bridgework may not be worn into surgery.  Leave suitcase in the car. After surgery it may be brought to your room.  For patients admitted to the hospital more than one night, checkout time is            11:00 AM                                                       The day of discharge.   Patients discharged the day of surgery will not be allowed to drive home.            If going home same day of surgery, must have someone stay with you              FIRST 24 hrs at home and arrange for some one to drive you              home from hospital.    Special Instructions             Please read over the following fact sheets that you were given:               1. [place]               2. INCENTIVE SPIROMETER               3. MRSA INFORMATION SHEET                                                X_____________________________________________________________________        Failure to follow these instructions may result in cancellation of your surgery

## 2013-08-13 ENCOUNTER — Encounter (HOSPITAL_COMMUNITY): Payer: Medicare Other | Admitting: Certified Registered"

## 2013-08-13 ENCOUNTER — Inpatient Hospital Stay (HOSPITAL_COMMUNITY): Payer: Medicare Other | Admitting: Certified Registered"

## 2013-08-13 ENCOUNTER — Encounter (HOSPITAL_COMMUNITY)
Admission: RE | Disposition: A | Discharge status: Home Health | Payer: Self-pay | Source: Ambulatory Visit | Attending: Orthopedic Surgery

## 2013-08-13 ENCOUNTER — Inpatient Hospital Stay (HOSPITAL_COMMUNITY)
Admission: RE | Admit: 2013-08-13 | Discharge: 2013-08-14 | DRG: 470 | Disposition: A | Discharge status: Home Health | Payer: Medicare Other | Source: Ambulatory Visit | Attending: Orthopedic Surgery | Admitting: Orthopedic Surgery

## 2013-08-13 ENCOUNTER — Encounter (HOSPITAL_COMMUNITY): Payer: Self-pay | Admitting: *Deleted

## 2013-08-13 DIAGNOSIS — E785 Hyperlipidemia, unspecified: Secondary | ICD-10-CM | POA: Diagnosis present

## 2013-08-13 DIAGNOSIS — Z6836 Body mass index (BMI) 36.0-36.9, adult: Secondary | ICD-10-CM | POA: Diagnosis not present

## 2013-08-13 DIAGNOSIS — IMO0002 Reserved for concepts with insufficient information to code with codable children: Secondary | ICD-10-CM | POA: Diagnosis not present

## 2013-08-13 DIAGNOSIS — K219 Gastro-esophageal reflux disease without esophagitis: Secondary | ICD-10-CM | POA: Diagnosis present

## 2013-08-13 DIAGNOSIS — I10 Essential (primary) hypertension: Secondary | ICD-10-CM | POA: Diagnosis present

## 2013-08-13 DIAGNOSIS — E669 Obesity, unspecified: Secondary | ICD-10-CM | POA: Diagnosis present

## 2013-08-13 DIAGNOSIS — M1710 Unilateral primary osteoarthritis, unspecified knee: Principal | ICD-10-CM | POA: Diagnosis present

## 2013-08-13 DIAGNOSIS — F411 Generalized anxiety disorder: Secondary | ICD-10-CM | POA: Diagnosis present

## 2013-08-13 DIAGNOSIS — J8410 Pulmonary fibrosis, unspecified: Secondary | ICD-10-CM | POA: Diagnosis not present

## 2013-08-13 DIAGNOSIS — Z96659 Presence of unspecified artificial knee joint: Secondary | ICD-10-CM

## 2013-08-13 HISTORY — PX: TOTAL KNEE ARTHROPLASTY: SHX125

## 2013-08-13 LAB — TYPE AND SCREEN
ABO/RH(D): A POS
Antibody Screen: NEGATIVE

## 2013-08-13 LAB — ABO/RH: ABO/RH(D): A POS

## 2013-08-13 SURGERY — ARTHROPLASTY, KNEE, TOTAL
Anesthesia: Spinal | Site: Knee | Laterality: Left

## 2013-08-13 MED ORDER — BUPIVACAINE-EPINEPHRINE (PF) 0.25% -1:200000 IJ SOLN
INTRAMUSCULAR | Status: DC | PRN
  Administered 2013-08-13: 25 mL

## 2013-08-13 MED ORDER — HYDROCODONE-ACETAMINOPHEN 7.5-325 MG PO TABS
1.0000 | ORAL_TABLET | ORAL | Status: DC
  Administered 2013-08-13: 1 via ORAL
  Administered 2013-08-13: 2 via ORAL
  Administered 2013-08-14 (×2): 1 via ORAL
  Filled 2013-08-13 (×3): qty 1
  Filled 2013-08-13: qty 2

## 2013-08-13 MED ORDER — BUPIVACAINE-EPINEPHRINE PF 0.25-1:200000 % IJ SOLN
INTRAMUSCULAR | Status: AC
  Filled 2013-08-13: qty 30

## 2013-08-13 MED ORDER — ALBUTEROL SULFATE (2.5 MG/3ML) 0.083% IN NEBU: 2.5000 mg | INHALATION_SOLUTION | Freq: Four times a day (QID) | RESPIRATORY_TRACT | Status: DC | PRN

## 2013-08-13 MED ORDER — KETOROLAC TROMETHAMINE 30 MG/ML IJ SOLN
INTRAMUSCULAR | Status: DC | PRN
  Administered 2013-08-13: 30 mg

## 2013-08-13 MED ORDER — KETOROLAC TROMETHAMINE 30 MG/ML IJ SOLN
INTRAMUSCULAR | Status: AC
  Filled 2013-08-13: qty 1

## 2013-08-13 MED ORDER — SODIUM CHLORIDE 0.9 % IJ SOLN
INTRAMUSCULAR | Status: AC
  Filled 2013-08-13: qty 20

## 2013-08-13 MED ORDER — SODIUM CHLORIDE 0.9 % IR SOLN
Status: DC | PRN
  Administered 2013-08-13: 1000 mL

## 2013-08-13 MED ORDER — BISACODYL 10 MG RE SUPP
10.0000 mg | Freq: Every day | RECTAL | Status: DC | PRN
Start: 2013-08-13 — End: 2013-08-15

## 2013-08-13 MED ORDER — BUPIVACAINE LIPOSOME 1.3 % IJ SUSP
20.0000 mL | Freq: Once | INTRAMUSCULAR | Status: AC
  Administered 2013-08-13: 20 mL
  Filled 2013-08-13: qty 20

## 2013-08-13 MED ORDER — CHLORHEXIDINE GLUCONATE 4 % EX LIQD: 60.0000 mL | Freq: Once | CUTANEOUS | Status: DC

## 2013-08-13 MED ORDER — HYDROMORPHONE HCL PF 1 MG/ML IJ SOLN
0.5000 mg | INTRAMUSCULAR | Status: DC | PRN
  Administered 2013-08-14: 0.5 mg via INTRAVENOUS
  Filled 2013-08-13: qty 1

## 2013-08-13 MED ORDER — ONDANSETRON HCL 4 MG/2ML IJ SOLN: 4.0000 mg | Freq: Four times a day (QID) | INTRAMUSCULAR | Status: DC | PRN

## 2013-08-13 MED ORDER — PHENOL 1.4 % MT LIQD
1.0000 | OROMUCOSAL | Status: DC | PRN
  Filled 2013-08-13: qty 177

## 2013-08-13 MED ORDER — DEXAMETHASONE SODIUM PHOSPHATE 10 MG/ML IJ SOLN
10.0000 mg | Freq: Once | INTRAMUSCULAR | Status: DC
  Filled 2013-08-13: qty 1

## 2013-08-13 MED ORDER — PROMETHAZINE HCL 25 MG/ML IJ SOLN: 6.2500 mg | INTRAMUSCULAR | Status: DC | PRN

## 2013-08-13 MED ORDER — CEFAZOLIN SODIUM-DEXTROSE 2-3 GM-% IV SOLR
INTRAVENOUS | Status: AC
  Filled 2013-08-13: qty 50

## 2013-08-13 MED ORDER — PROPOFOL 10 MG/ML IV BOLUS
INTRAVENOUS | Status: AC
  Filled 2013-08-13: qty 20

## 2013-08-13 MED ORDER — SODIUM CHLORIDE 0.9 % IV SOLN
INTRAVENOUS | Status: DC
  Administered 2013-08-13 – 2013-08-14 (×2): via INTRAVENOUS
  Filled 2013-08-13 (×6): qty 1000

## 2013-08-13 MED ORDER — FERROUS SULFATE 325 (65 FE) MG PO TABS
325.0000 mg | ORAL_TABLET | Freq: Three times a day (TID) | ORAL | Status: DC
Start: 2013-08-13 — End: 2013-08-15
  Administered 2013-08-13 – 2013-08-14 (×2): 325 mg via ORAL
  Filled 2013-08-13 (×5): qty 1

## 2013-08-13 MED ORDER — MENTHOL 3 MG MT LOZG
1.0000 | LOZENGE | OROMUCOSAL | Status: DC | PRN
  Filled 2013-08-13: qty 9

## 2013-08-13 MED ORDER — DOCUSATE SODIUM 100 MG PO CAPS
100.0000 mg | ORAL_CAPSULE | Freq: Two times a day (BID) | ORAL | Status: DC
  Administered 2013-08-13 – 2013-08-14 (×3): 100 mg via ORAL

## 2013-08-13 MED ORDER — METOCLOPRAMIDE HCL 10 MG PO TABS
5.0000 mg | ORAL_TABLET | Freq: Three times a day (TID) | ORAL | Status: DC | PRN
  Administered 2013-08-13: 10 mg via ORAL
  Filled 2013-08-13: qty 1

## 2013-08-13 MED ORDER — CEFAZOLIN SODIUM-DEXTROSE 2-3 GM-% IV SOLR
2.0000 g | Freq: Four times a day (QID) | INTRAVENOUS | Status: AC
  Administered 2013-08-13 – 2013-08-14 (×2): 2 g via INTRAVENOUS
  Filled 2013-08-13 (×2): qty 50

## 2013-08-13 MED ORDER — PROPOFOL 10 MG/ML IV BOLUS
INTRAVENOUS | Status: DC | PRN
  Administered 2013-08-13: 10 mg via INTRAVENOUS

## 2013-08-13 MED ORDER — ASPIRIN EC 325 MG PO TBEC
325.0000 mg | DELAYED_RELEASE_TABLET | Freq: Two times a day (BID) | ORAL | Status: DC
Start: 2013-08-14 — End: 2013-08-15
  Administered 2013-08-14 (×2): 325 mg via ORAL
  Filled 2013-08-13 (×3): qty 1

## 2013-08-13 MED ORDER — SODIUM CHLORIDE 0.9 % IJ SOLN
INTRAMUSCULAR | Status: DC | PRN
  Administered 2013-08-13: 14 mL

## 2013-08-13 MED ORDER — MIDAZOLAM HCL 5 MG/5ML IJ SOLN
INTRAMUSCULAR | Status: DC | PRN
  Administered 2013-08-13: 2 mg via INTRAVENOUS

## 2013-08-13 MED ORDER — BUPIVACAINE IN DEXTROSE 0.75-8.25 % IT SOLN
INTRATHECAL | Status: DC | PRN
  Administered 2013-08-13: 2 mL via INTRATHECAL

## 2013-08-13 MED ORDER — MIDAZOLAM HCL 2 MG/2ML IJ SOLN
INTRAMUSCULAR | Status: AC
  Filled 2013-08-13: qty 2

## 2013-08-13 MED ORDER — PROPOFOL INFUSION 10 MG/ML OPTIME
INTRAVENOUS | Status: DC | PRN
  Administered 2013-08-13: 100 ug/kg/min via INTRAVENOUS

## 2013-08-13 MED ORDER — 0.9 % SODIUM CHLORIDE (POUR BTL) OPTIME
TOPICAL | Status: DC | PRN
  Administered 2013-08-13: 1000 mL

## 2013-08-13 MED ORDER — METHOCARBAMOL 100 MG/ML IJ SOLN
500.0000 mg | Freq: Four times a day (QID) | INTRAVENOUS | Status: DC | PRN
  Administered 2013-08-13: 500 mg via INTRAVENOUS
  Filled 2013-08-13 (×2): qty 5

## 2013-08-13 MED ORDER — LORAZEPAM 0.5 MG PO TABS
0.2500 mg | ORAL_TABLET | Freq: Three times a day (TID) | ORAL | Status: DC | PRN
  Administered 2013-08-13: 0.25 mg via ORAL
  Filled 2013-08-13: qty 1

## 2013-08-13 MED ORDER — DIPHENHYDRAMINE HCL 25 MG PO CAPS
25.0000 mg | ORAL_CAPSULE | Freq: Four times a day (QID) | ORAL | Status: DC | PRN
Start: 2013-08-13 — End: 2013-08-15

## 2013-08-13 MED ORDER — POLYETHYLENE GLYCOL 3350 17 G PO PACK
17.0000 g | PACK | Freq: Two times a day (BID) | ORAL | Status: DC
  Administered 2013-08-13 – 2013-08-14 (×3): 17 g via ORAL

## 2013-08-13 MED ORDER — LACTATED RINGERS IV SOLN
Freq: Once | INTRAVENOUS | Status: AC
  Administered 2013-08-13: 1000 mL via INTRAVENOUS

## 2013-08-13 MED ORDER — DEXAMETHASONE SODIUM PHOSPHATE 10 MG/ML IJ SOLN
INTRAMUSCULAR | Status: DC | PRN
  Administered 2013-08-13: 10 mg via INTRAVENOUS

## 2013-08-13 MED ORDER — METHOCARBAMOL 500 MG PO TABS
500.0000 mg | ORAL_TABLET | Freq: Four times a day (QID) | ORAL | Status: DC | PRN
  Administered 2013-08-14 (×3): 500 mg via ORAL
  Filled 2013-08-13 (×3): qty 1

## 2013-08-13 MED ORDER — FLEET ENEMA 7-19 GM/118ML RE ENEM: 1.0000 | ENEMA | Freq: Once | RECTAL | Status: AC | PRN

## 2013-08-13 MED ORDER — HYDROMORPHONE HCL PF 1 MG/ML IJ SOLN: 0.2500 mg | INTRAMUSCULAR | Status: DC | PRN

## 2013-08-13 MED ORDER — LACTATED RINGERS IV SOLN
INTRAVENOUS | Status: DC | PRN
  Administered 2013-08-13: 13:00:00 via INTRAVENOUS

## 2013-08-13 MED ORDER — FENTANYL CITRATE 0.05 MG/ML IJ SOLN
INTRAMUSCULAR | Status: AC
  Filled 2013-08-13: qty 2

## 2013-08-13 MED ORDER — ONDANSETRON HCL 4 MG/2ML IJ SOLN
INTRAMUSCULAR | Status: DC | PRN
  Administered 2013-08-13: 4 mg via INTRAVENOUS

## 2013-08-13 MED ORDER — CEFAZOLIN SODIUM-DEXTROSE 2-3 GM-% IV SOLR
2.0000 g | INTRAVENOUS | Status: AC
  Administered 2013-08-13: 2 g via INTRAVENOUS

## 2013-08-13 MED ORDER — ONDANSETRON HCL 4 MG PO TABS: 4.0000 mg | ORAL_TABLET | Freq: Four times a day (QID) | ORAL | Status: DC | PRN

## 2013-08-13 MED ORDER — ALUM & MAG HYDROXIDE-SIMETH 200-200-20 MG/5ML PO SUSP
30.0000 mL | ORAL | Status: DC | PRN
  Administered 2013-08-13 – 2013-08-14 (×2): 30 mL via ORAL
  Filled 2013-08-13 (×2): qty 30

## 2013-08-13 MED ORDER — SODIUM CHLORIDE 0.9 % IV SOLN
1000.0000 mg | Freq: Once | INTRAVENOUS | Status: AC
  Administered 2013-08-13: 1000 mg via INTRAVENOUS
  Filled 2013-08-13: qty 10

## 2013-08-13 MED ORDER — FUROSEMIDE 20 MG PO TABS
20.0000 mg | ORAL_TABLET | ORAL | Status: DC | PRN
  Filled 2013-08-13: qty 1

## 2013-08-13 MED ORDER — NITROGLYCERIN 0.4 MG SL SUBL: 0.4000 mg | SUBLINGUAL_TABLET | SUBLINGUAL | Status: DC | PRN

## 2013-08-13 MED ORDER — METOCLOPRAMIDE HCL 5 MG/ML IJ SOLN: 5.0000 mg | Freq: Three times a day (TID) | INTRAMUSCULAR | Status: DC | PRN

## 2013-08-13 MED ORDER — FENTANYL CITRATE 0.05 MG/ML IJ SOLN
INTRAMUSCULAR | Status: DC | PRN
  Administered 2013-08-13 (×2): 50 ug via INTRAVENOUS

## 2013-08-13 SURGICAL SUPPLY — 60 items
ADH SKN CLS APL DERMABOND .7 (GAUZE/BANDAGES/DRESSINGS) ×1
BAG SPEC THK2 15X12 ZIP CLS (MISCELLANEOUS) ×1
BAG ZIPLOCK 12X15 (MISCELLANEOUS) ×2 IMPLANT
BANDAGE ELASTIC 6 VELCRO ST LF (GAUZE/BANDAGES/DRESSINGS) ×2 IMPLANT
BANDAGE ESMARK 6X9 LF (GAUZE/BANDAGES/DRESSINGS) ×1 IMPLANT
BLADE SAW SGTL 13.0X1.19X90.0M (BLADE) ×2 IMPLANT
BNDG CMPR 9X6 STRL LF SNTH (GAUZE/BANDAGES/DRESSINGS) ×1
BNDG ESMARK 6X9 LF (GAUZE/BANDAGES/DRESSINGS) ×2
BOWL SMART MIX CTS (DISPOSABLE) ×2 IMPLANT
CAPT RP KNEE ×1 IMPLANT
CEMENT HV SMART SET (Cement) ×2 IMPLANT
CUFF TOURN SGL QUICK 34 (TOURNIQUET CUFF) ×2
CUFF TRNQT CYL 34X4X40X1 (TOURNIQUET CUFF) ×1 IMPLANT
DERMABOND ADVANCED (GAUZE/BANDAGES/DRESSINGS) ×1
DERMABOND ADVANCED .7 DNX12 (GAUZE/BANDAGES/DRESSINGS) ×1 IMPLANT
DRAPE EXTREMITY T 121X128X90 (DRAPE) ×2 IMPLANT
DRAPE POUCH INSTRU U-SHP 10X18 (DRAPES) ×2 IMPLANT
DRAPE U-SHAPE 47X51 STRL (DRAPES) ×2 IMPLANT
DRSG AQUACEL AG ADV 3.5X10 (GAUZE/BANDAGES/DRESSINGS) ×2 IMPLANT
DRSG TEGADERM 4X4.75 (GAUZE/BANDAGES/DRESSINGS) IMPLANT
DURAPREP 26ML APPLICATOR (WOUND CARE) ×4 IMPLANT
ELECT REM PT RETURN 9FT ADLT (ELECTROSURGICAL) ×2
ELECTRODE REM PT RTRN 9FT ADLT (ELECTROSURGICAL) ×1 IMPLANT
EVACUATOR 1/8 PVC DRAIN (DRAIN) IMPLANT
FACESHIELD LNG OPTICON STERILE (SAFETY) ×10 IMPLANT
GAUZE SPONGE 2X2 8PLY STRL LF (GAUZE/BANDAGES/DRESSINGS) IMPLANT
GLOVE BIOGEL PI IND STRL 7.5 (GLOVE) ×1 IMPLANT
GLOVE BIOGEL PI IND STRL 8 (GLOVE) ×1 IMPLANT
GLOVE BIOGEL PI INDICATOR 7.5 (GLOVE) ×1
GLOVE BIOGEL PI INDICATOR 8 (GLOVE) ×1
GLOVE ECLIPSE 8.0 STRL XLNG CF (GLOVE) ×2 IMPLANT
GLOVE ORTHO TXT STRL SZ7.5 (GLOVE) ×4 IMPLANT
GOWN BRE IMP PREV XXLGXLNG (GOWN DISPOSABLE) ×1 IMPLANT
GOWN SPEC L3 XXLG W/TWL (GOWN DISPOSABLE) ×2 IMPLANT
GOWN STRL REUS W/ TWL XL LVL3 (GOWN DISPOSABLE) IMPLANT
GOWN STRL REUS W/TWL LRG LVL3 (GOWN DISPOSABLE) ×2 IMPLANT
GOWN STRL REUS W/TWL XL LVL3 (GOWN DISPOSABLE) ×2
HANDPIECE INTERPULSE COAX TIP (DISPOSABLE) ×2
KIT BASIN OR (CUSTOM PROCEDURE TRAY) ×2 IMPLANT
MANIFOLD NEPTUNE II (INSTRUMENTS) ×2 IMPLANT
NDL SAFETY ECLIPSE 18X1.5 (NEEDLE) ×1 IMPLANT
NEEDLE HYPO 18GX1.5 SHARP (NEEDLE) ×2
NS IRRIG 1000ML POUR BTL (IV SOLUTION) ×2 IMPLANT
PACK TOTAL JOINT (CUSTOM PROCEDURE TRAY) ×2 IMPLANT
POSITIONER SURGICAL ARM (MISCELLANEOUS) ×2 IMPLANT
SET HNDPC FAN SPRY TIP SCT (DISPOSABLE) ×1 IMPLANT
SET PAD KNEE POSITIONER (MISCELLANEOUS) ×2 IMPLANT
SPONGE GAUZE 2X2 STER 10/PKG (GAUZE/BANDAGES/DRESSINGS)
SUCTION FRAZIER 12FR DISP (SUCTIONS) ×2 IMPLANT
SUT MNCRL AB 4-0 PS2 18 (SUTURE) ×2 IMPLANT
SUT VIC AB 1 CT1 36 (SUTURE) ×2 IMPLANT
SUT VIC AB 2-0 CT1 27 (SUTURE) ×6
SUT VIC AB 2-0 CT1 TAPERPNT 27 (SUTURE) ×3 IMPLANT
SUT VLOC 180 0 24IN GS25 (SUTURE) ×2 IMPLANT
SYR 50ML LL SCALE MARK (SYRINGE) ×2 IMPLANT
TOWEL OR 17X26 10 PK STRL BLUE (TOWEL DISPOSABLE) ×2 IMPLANT
TOWEL OR NON WOVEN STRL DISP B (DISPOSABLE) ×1 IMPLANT
TRAY FOLEY CATH 14FRSI W/METER (CATHETERS) ×2 IMPLANT
WATER STERILE IRR 1500ML POUR (IV SOLUTION) ×2 IMPLANT
WRAP KNEE MAXI GEL POST OP (GAUZE/BANDAGES/DRESSINGS) ×2 IMPLANT

## 2013-08-13 NOTE — Progress Notes (Addendum)
Pt c/o feeling "Antsy", would like "something to take the edge off". States she had taken over the counter antianxiety agents in the past. Unsure of the name. Will call MD for prn order. States used "meprobamate" in the past.

## 2013-08-13 NOTE — Interval H&P Note (Signed)
History and Physical Interval Note:  08/13/2013 11:51 AM  [name]  has presented today for surgery, with the diagnosis of LEFT KNEE OSTEOARTHRITIS  The various methods of treatment have been discussed with the patient and family. After consideration of risks, benefits and other options for treatment, the patient has consented to  Procedure(s): LEFT TOTAL KNEE ARTHROPLASTY (Left) as a surgical intervention .  The patient's history has been reviewed, patient examined, no change in status, stable for surgery.  I have reviewed the patient's chart and labs.  Questions were answered to the patient's satisfaction.     Mauri Pole

## 2013-08-13 NOTE — Anesthesia Procedure Notes (Signed)
Spinal  Patient location during procedure: OR Start time: 08/13/2013 1:02 PM End time: 08/13/2013 1:08 PM Staffing CRNA/Resident: EARGLE, BETH E Performed by: resident/CRNA  Preanesthetic Checklist Completed: patient identified, site marked, surgical consent, pre-op evaluation, timeout performed, IV checked, risks and benefits discussed and monitors and equipment checked Spinal Block Patient position: sitting Prep: Betadine Patient monitoring: heart rate, continuous pulse ox and blood pressure Approach: midline Location: L3-4 Injection technique: single-shot Needle Needle type: Sprotte  Needle length: 10 cm Assessment Sensory level: T8 Additional Notes Expiration of kit checked, time out, Sitting, sterile prep and drape. Clear CSF pre/post injection. Pt. tol well. Return to supine.   

## 2013-08-13 NOTE — Preoperative (Signed)
Beta Blockers   Reason not to administer Beta Blockers:Not Applicable 

## 2013-08-13 NOTE — Transfer of Care (Signed)
Immediate Anesthesia Transfer of Care Note  Patient: [name]  Procedure(s) Performed: Procedure(s) (LRB): LEFT TOTAL KNEE ARTHROPLASTY (Left)  Patient Location: PACU  Anesthesia Type: Spinal  Level of Consciousness: sedated, patient cooperative and responds to stimulation  Airway & Oxygen Therapy: Patient Spontanous Breathing and Patient connected to face mask oxgen  Post-op Assessment: Report given to PACU RN and Post -op Vital signs reviewed and stable  Post vital signs: Reviewed and stable  Complications: No apparent anesthesia complications

## 2013-08-13 NOTE — Op Note (Signed)
NAME:  [name]                      MEDICAL RECORD NO.:  [id number]                             FACILITY:  [facility]      PHYSICIAN:  Pietro Cassis. Alvan Dame, M.D.  DATE OF BIRTH:  Aug 03, 1940      DATE OF PROCEDURE:  08/13/2013                                     OPERATIVE REPORT         PREOPERATIVE DIAGNOSIS:  Left knee osteoarthritis.      POSTOPERATIVE DIAGNOSIS:  Left knee osteoarthritis.      FINDINGS:  The patient was noted to have complete loss of cartilage and   bone-on-bone arthritis with associated osteophytes in the medial and patellofemoral compartments of   the knee with a significant synovitis and associated effusion.      PROCEDURE:  Left total knee replacement.      COMPONENTS USED:  DePuy rotating platform posterior stabilized knee   system, a size 3 femur, 3 tibia, 12.5 mm insert, and 38 patellar   button.      SURGEON:  Pietro Cassis. Alvan Dame, M.D.      ASSISTANT:  Danae Orleans, PA-C.      ANESTHESIA:  Spinal.      SPECIMENS:  None.      COMPLICATION:  None.      DRAINS:  One Hemovac.  EBL: <100cc      TOURNIQUET TIME:   Total Tourniquet Time Documented: Thigh (Left) - 35 minutes Total: Thigh (Left) - 35 minutes.      The patient was stable to the recovery room.      INDICATION FOR PROCEDURE:  [name] is a 73 y.o. female patient of   mine.  The patient had been seen, evaluated, and treated conservatively in the   office with medication, activity modification, and injections.  The patient had   radiographic changes of bone-on-bone arthritis with endplate sclerosis and osteophytes noted.      The patient failed conservative measures including medication, injections, and activity modification, and at this point was ready for more definitive measures.   Based on the radiographic changes and failed conservative measures, the patient   decided to proceed with total knee replacement.  Risks of infection,   DVT, component failure, need for  revision surgery, postop course, and   expectations were all   discussed and reviewed.  Consent was obtained for benefit of pain   relief.      PROCEDURE IN DETAIL:  The patient was brought to the operative theater.   Once adequate anesthesia, preoperative antibiotics, 2 gm of Ancef administered, the patient was positioned supine with the left thigh tourniquet placed.  The  left lower extremity was prepped and draped in sterile fashion.  A time-   out was performed identifying the patient, planned procedure, and   extremity.      The left lower extremity was placed in the [facility] leg holder.  The leg was   exsanguinated, tourniquet elevated to 250 mmHg.  A midline incision was   made followed by median parapatellar arthrotomy.  Following initial   exposure, attention was first directed to  the patella.  Precut   measurement was noted to be 23 mm.  I resected down to 14 mm and used a   38 patellar button to restore patellar height as well as cover the cut   surface.      The lug holes were drilled and a metal shim was placed to protect the   patella from retractors and saw blades.      At this point, attention was now directed to the femur.  The femoral   canal was opened with a drill, irrigated to try to prevent fat emboli.  An   intramedullary rod was passed at 5 degrees valgus, 10 mm of bone was   resected off the distal femur.  Following this resection, the tibia was   subluxated anteriorly.  Using the extramedullary guide, 10 mm of bone was resected off   the proximal lateral tibia.  We confirmed the gap would be   stable medially and laterally with a 10 mm insert as well as confirmed   the cut was perpendicular in the coronal plane, checking with an alignment rod.      Once this was done, I sized the femur to be a size 3 in the anterior-   posterior dimension, chose a standard component based on medial and   lateral dimension.  The size 3 rotation block was then pinned in    position anterior referenced using the C-clamp to set rotation.  The   anterior, posterior, and  chamfer cuts were made without difficulty nor   notching making certain that I was along the anterior cortex to help   with flexion gap stability.      The final box cut was made off the lateral aspect of distal femur.      At this point, the tibia was sized to be a size 3, the size 3 tray was   then pinned in position through the medial third of the tubercle,   drilled, and keel punched.  Trial reduction was now carried with a 3 femur,  3 tibia, a 12.5 mm PS insert, and the 38 patella botton.  The knee was brought to   extension, full extension with good flexion stability with the patella   tracking through the trochlea without application of pressure.  Given   all these findings, the trial components removed.  Final components were   opened and cement was mixed.  The knee was irrigated with normal saline   solution and pulse lavage.  The synovial lining was   then injected with 20cc of Exparel, 30cc 0.25% Marcaine with epinephrine and 1 cc of Toradol,   total of 61 cc.      The knee was irrigated.  Final implants were then cemented onto clean and   dried cut surfaces of bone with the knee brought to extension with a 12.5 mm trial insert.      Once the cement had fully cured, the excess cement was removed   throughout the knee.  I confirmed I was satisfied with the range of   motion and stability, and the final 12.5 mm PS insert was chosen.  It was   placed into the knee.      The tourniquet had been let down at 35 minutes.  No significant   hemostasis required.  The   extensor mechanism was then reapproximated using #1 Vicryl with the knee   in flexion.  The   remaining wound was closed with  2-0 Vicryl and running 4-0 Monocryl.   The knee was cleaned, dried, dressed sterilely using Dermabond and   Aquacel dressing.  The patient was then   brought to recovery room in stable condition,  tolerating the procedure   well.   Please note that Physician Assistant, Danae Orleans, was present for the entirety of the case, and was utilized for pre-operative positioning, peri-operative retractor management, general facilitation of the procedure.  He was also utilized for primary wound closure at the end of the case.              Pietro Cassis Alvan Dame, M.D.    08/13/2013 2:27 PM

## 2013-08-13 NOTE — Progress Notes (Signed)
Utilization review completed.

## 2013-08-13 NOTE — Anesthesia Postprocedure Evaluation (Signed)
  Anesthesia Post-op Note  Patient: [name]  Procedure(s) Performed: Procedure(s) (LRB): LEFT TOTAL KNEE ARTHROPLASTY (Left)  Patient Location: PACU  Anesthesia Type: Spinal  Level of Consciousness: awake and alert   Airway and Oxygen Therapy: Patient Spontanous Breathing  Post-op Pain: mild  Post-op Assessment: Post-op Vital signs reviewed, Patient's Cardiovascular Status Stable, Respiratory Function Stable, Patent Airway and No signs of Nausea or vomiting  Last Vitals:  Filed Vitals:   08/13/13 1824  BP: 142/72  Pulse: 84  Temp: 36.8 C  Resp: 16    Post-op Vital Signs: stable   Complications: No apparent anesthesia complications

## 2013-08-13 NOTE — Anesthesia Preprocedure Evaluation (Signed)
Anesthesia Evaluation  Patient identified by MRN, date of birth, ID band Patient awake    Reviewed: Allergy & Precautions, H&P , NPO status , Patient's Chart, lab work & pertinent test results  Airway Mallampati: II TM Distance: >3 FB Neck ROM: Full    Dental no notable dental hx.    Pulmonary shortness of breath,  Pulmonary fibrosis. breath sounds clear to auscultation  Pulmonary exam normal       Cardiovascular hypertension, Pt. on medications Rhythm:Regular Rate:Normal     Neuro/Psych Anxiety Chronic back pain. negative neurological ROS     GI/Hepatic Neg liver ROS, GERD-  ,  Endo/Other  negative endocrine ROS  Renal/GU negative Renal ROS  negative genitourinary   Musculoskeletal negative musculoskeletal ROS (+)   Abdominal (+) + obese,   Peds negative pediatric ROS (+)  Hematology negative hematology ROS (+)   Anesthesia Other Findings   Reproductive/Obstetrics negative OB ROS                           Anesthesia Physical Anesthesia Plan  ASA: III  Anesthesia Plan: Spinal   Post-op Pain Management:    Induction: Intravenous  Airway Management Planned:   Additional Equipment:   Intra-op Plan:   Post-operative Plan:   Informed Consent: I have reviewed the patients History and Physical, chart, labs and discussed the procedure including the risks, benefits and alternatives for the proposed anesthesia with the patient or authorized representative who has indicated his/her understanding and acceptance.   Dental advisory given  Plan Discussed with: CRNA  Anesthesia Plan Comments: (Discussed r/b general versus spinal. Recommended spinal in light of symptomatic pulmonary fibrosis. Patient consents to spinal. Discussed risks/benefits of spinal including headache, backache, failure, bleeding, infection, and nerve damage. Patient consents to spinal. Questions answered. Coagulation  studies and platelet count acceptable.)        Anesthesia Quick Evaluation

## 2013-08-14 ENCOUNTER — Encounter (HOSPITAL_COMMUNITY): Payer: Self-pay | Admitting: Orthopedic Surgery

## 2013-08-14 DIAGNOSIS — E669 Obesity, unspecified: Secondary | ICD-10-CM | POA: Diagnosis present

## 2013-08-14 LAB — CBC
HCT: 38.5 % (ref 36.0–46.0)
Hemoglobin: 12.4 g/dL (ref 12.0–15.0)
MCH: 28.8 pg (ref 26.0–34.0)
MCHC: 32.2 g/dL (ref 30.0–36.0)
MCV: 89.3 fL (ref 78.0–100.0)
Platelets: 288 10*3/uL (ref 150–400)
RBC: 4.31 MIL/uL (ref 3.87–5.11)
RDW: 14.5 % (ref 11.5–15.5)
WBC: 16.5 10*3/uL — ABNORMAL HIGH (ref 4.0–10.5)

## 2013-08-14 LAB — BASIC METABOLIC PANEL
BUN: 8 mg/dL (ref 6–23)
CO2: 24 mEq/L (ref 19–32)
Calcium: 8.6 mg/dL (ref 8.4–10.5)
Chloride: 102 mEq/L (ref 96–112)
Creatinine, Ser: 0.53 mg/dL (ref 0.50–1.10)
GFR calc Af Amer: >90 mL/min (ref >90)
GFR calc non Af Amer: >90 mL/min (ref >90)
Glucose, Bld: 133 mg/dL — ABNORMAL HIGH (ref 70–99)
Potassium: 4.3 mEq/L (ref 3.7–5.3)
Sodium: 138 mEq/L (ref 137–147)

## 2013-08-14 MED ORDER — ASPIRIN 325 MG PO TBEC: 325.0000 mg | DELAYED_RELEASE_TABLET | Freq: Two times a day (BID) | ORAL | Status: AC

## 2013-08-14 MED ORDER — HYDROCODONE-ACETAMINOPHEN 5-325 MG PO TABS: 1.0000 | ORAL_TABLET | ORAL | Status: DC | PRN

## 2013-08-14 MED ORDER — HYDROCODONE-ACETAMINOPHEN 5-325 MG PO TABS
1.0000 | ORAL_TABLET | ORAL | Status: DC | PRN
  Administered 2013-08-14 (×2): 1 via ORAL
  Administered 2013-08-14: 2 via ORAL
  Administered 2013-08-14: 1 via ORAL
  Filled 2013-08-14: qty 1
  Filled 2013-08-14: qty 2
  Filled 2013-08-14 (×2): qty 1

## 2013-08-14 MED ORDER — TIZANIDINE HCL 4 MG PO CAPS: 4.0000 mg | ORAL_CAPSULE | Freq: Three times a day (TID) | ORAL | Status: DC | PRN

## 2013-08-14 MED ORDER — FERROUS SULFATE 325 (65 FE) MG PO TABS: 325.0000 mg | ORAL_TABLET | Freq: Three times a day (TID) | ORAL | Status: DC

## 2013-08-14 MED ORDER — POLYETHYLENE GLYCOL 3350 17 G PO PACK: 17.0000 g | PACK | Freq: Two times a day (BID) | ORAL | Status: DC

## 2013-08-14 MED ORDER — DSS 100 MG PO CAPS: 100.0000 mg | ORAL_CAPSULE | Freq: Two times a day (BID) | ORAL | Status: DC

## 2013-08-14 NOTE — Evaluation (Signed)
Occupational Therapy Evaluation Patient Details Name: [name] MRN: [id number] DOB: Jul 30, 1940 Today's Date: 08/14/2013 Time: 3474-2595 OT Time Calculation (min): 19 min  OT Assessment / Plan / Recommendation History of present illness Pt is s/p L TKA   Clinical Impression   Pt transferred to 3in1 with min assist and mod cues for safety with RW use and hand placement/LE management. Granddaughter will be assisting at d/c and was present for session. Will benefit from continued OT services to maximize ADL independence for d/c home.    OT Assessment  Patient needs continued OT Services    Follow Up Recommendations  No OT follow up;Supervision/Assistance - 24 hour    Barriers to Discharge      Equipment Recommendations  3 in 1 bedside comode    Recommendations for Other Services    Frequency  Min 2X/week    Precautions / Restrictions Precautions Precautions: Knee Restrictions Weight Bearing Restrictions: No Other Position/Activity Restrictions: WBAT   Pertinent Vitals/Pain 2/10 L knee; reposition, ice    ADL  Eating/Feeding: Simulated;Independent Where Assessed - Eating/Feeding: Chair Grooming: Performed;Wash/dry hands;Minimal assistance Where Assessed - Grooming: Unsupported standing Upper Body Bathing: Simulated;Chest;Right arm;Left arm;Abdomen;Set up Where Assessed - Upper Body Bathing: Unsupported sitting Lower Body Bathing: Simulated;Moderate assistance Where Assessed - Lower Body Bathing: Supported sit to stand Upper Body Dressing: Simulated;Set up Where Assessed - Upper Body Dressing: Unsupported sitting Lower Body Dressing: Simulated;Moderate assistance Where Assessed - Lower Body Dressing: Supported sit to Lobbyist: Performed;Minimal Manufacturing systems engineer: Raised toilet seat with arms (or 3-in-1 over toilet) Toileting - Clothing Manipulation and Hygiene: Performed;Minimal assistance Where Assessed - Microbiologist and Hygiene: Sit to stand from 3-in-1 or toilet Equipment Used: Rolling walker ADL Comments: Pt moves a little quickly at times and needs cues for safety with walker use. Her granddaughter will be staying with her at d/c and will be helping with ADL. She declines need for all AE (has a Secondary school teacher) as she states her granddaughter can help. Educated pt on 3in1 as a shower seat.     OT Diagnosis: Generalized weakness  OT Problem List: Decreased strength;Decreased knowledge of use of DME or AE OT Treatment Interventions: Self-care/ADL training;Patient/family education;Therapeutic activities;DME and/or AE instruction   OT Goals(Current goals can be found in the care plan section) Acute Rehab OT Goals Patient Stated Goal: wants to regain independence. OT Goal Formulation: With patient Time For Goal Achievement: 08/21/13 Potential to Achieve Goals: Good  Visit Information  Last OT Received On: 08/14/13 Assistance Needed: +1 History of Present Illness: Pt is s/p L TKA       Prior [place] expects to be discharged to:: Private residence Living Arrangements: Alone Available Help at Discharge: Family (granddaugther will be helping for a few weeks) Type of Home: [place]: None Prior Function Level of Independence: Independent Communication Communication: No difficulties         Vision/Perception     Cognition  Cognition Arousal/Alertness: Awake/alert Behavior During Therapy: WFL for tasks assessed/performed Overall Cognitive Status: Within Functional Limits for tasks assessed    Extremity/Trunk Assessment Upper Extremity Assessment Upper Extremity Assessment: Overall WFL for tasks assessed     Mobility Transfers Overall transfer level: Needs assistance Equipment used: Rolling walker (2 wheeled) Transfers: Sit to/from Stand Sit to Stand: [place] transfer comment: verbal cues for hand placement and LE  management.     Exercise     Balance  End of Session OT - End of Session Equipment Utilized During Treatment: Rolling walker Activity Tolerance: Patient tolerated treatment well Patient left: in chair;with call bell/phone within reach;with family/visitor present  [place], [place] [phone] 08/14/2013, 10:49 AM

## 2013-08-14 NOTE — Progress Notes (Signed)
Physical Therapy Treatment Patient Details Name: [name] MRN: [id number] DOB: Feb 16, 1941 Today's Date: 08/14/2013 Time: 1510-1536 PT Time Calculation (min): 26 min  PT Assessment / Plan / Recommendation  History of Present Illness Pt is s/p L TKA   PT Comments   Pt granddaughter present and participating including assisting pt in bathroom and with stairs.  Follow Up Recommendations  Home health PT     Does the patient have the potential to tolerate intense rehabilitation     Barriers to Discharge        Equipment Recommendations  Rolling walker with 5" wheels    Recommendations for Other Services OT consult  Frequency 7X/week   Progress towards PT Goals Progress towards PT goals: Progressing toward goals  Plan Current plan remains appropriate    Precautions / Restrictions Precautions Precautions: Knee Restrictions Weight Bearing Restrictions: No Other Position/Activity Restrictions: WBAT   Pertinent Vitals/Pain 4/10; premed, ice packs provided    Mobility  Bed Mobility Overal bed mobility: Needs Assistance Bed Mobility: Supine to Sit;Sit to Supine Supine to sit: Supervision Sit to supine: Min guard General bed mobility comments: cues for sequence and use or R LE to self assist Transfers Overall transfer level: Needs assistance Equipment used: Rolling walker (2 wheeled) Transfers: Sit to/from Stand Sit to Stand: Supervision General transfer comment: verbal cues for hand placement and LE management. Ambulation/Gait Ambulation/Gait assistance: Min guard;Supervision Ambulation Distance (Feet): 100 Feet (and 25) Assistive device: Rolling walker (2 wheeled) Gait Pattern/deviations: Step-to pattern;Decreased step length - right;Decreased step length - left;Shuffle;Trunk flexed Gait velocity: decr General Gait Details: cues for sequence, posture, position from RW Stairs: Yes Stairs assistance: Min assist Stair Management: No rails;Step to  pattern;Backwards;With walker Number of Stairs: 1 (three times - third time with grandaughter) General stair comments: cues for sequence and foot/RW placement    Exercises     PT Diagnosis:    PT Problem List:   PT Treatment Interventions:     PT Goals (current goals can now be found in the care plan section) Acute Rehab PT Goals Patient Stated Goal: wants to regain independence. PT Goal Formulation: With patient Time For Goal Achievement: 08/20/13 Potential to Achieve Goals: Good  Visit Information  Last PT Received On: 08/14/13 Assistance Needed: +1 History of Present Illness: Pt is s/p L TKA    Subjective Data  Subjective: It hurts more now than it has but I had my pain medicine Patient Stated Goal: wants to regain independence.   Cognition  Cognition Arousal/Alertness: Awake/alert Behavior During Therapy: WFL for tasks assessed/performed Overall Cognitive Status: Within Functional Limits for tasks assessed    Balance     End of Session PT - End of Session Equipment Utilized During Treatment: Gait belt Activity Tolerance: Patient tolerated treatment well Patient left: in bed;with call bell/phone within reach;with family/visitor present Nurse Communication: Mobility status   GP     [name] 08/14/2013, 4:54 PM

## 2013-08-14 NOTE — Progress Notes (Signed)
   Subjective: 1 Day Post-Op Procedure(s) (LRB): LEFT TOTAL KNEE ARTHROPLASTY (Left)   Patient reports pain as mild, pain controlled. No events throughout the night. Ready to be discharged home if she does well with PT and pain controlled.   Objective:   VITALS:   Filed Vitals:   08/14/13 0517  BP: 125/69  Pulse: 79  Temp: 98 F (36.7 C)  Resp: 18    Neurovascular intact Dorsiflexion/Plantar flexion intact Incision: dressing C/D/I No cellulitis present Compartment soft  LABS  Recent Labs  08/14/13 0440  HGB 12.4  HCT 38.5  WBC 16.5*  PLT 288     Recent Labs  08/14/13 0440  NA 138  K 4.3  BUN 8  CREATININE 0.53  GLUCOSE 133*     Assessment/Plan: 1 Day Post-Op Procedure(s) (LRB): LEFT TOTAL KNEE ARTHROPLASTY (Left) Foley cath d/c'ed Advance diet Up with therapy D/C IV fluids Discharge home with home health [facility]. Follow up with OLIN,Tacey Dimaggio D in 2 weeks.  Contact information:  [facility] [address], [place] [phone]    Obese (BMI 30-39.9) Estimated body mass index is 36.48 kg/(m^2) as calculated from the following:   Height as of this encounter: _0  (1.702 m).   Weight as of this encounter: 105.688 kg (233 lb). Patient also counseled that weight may inhibit the healing process Patient counseled that losing weight will help with future health issues      West Pugh. Pearly Bartosik   PAC  08/14/2013, 9:02 AM

## 2013-08-14 NOTE — Progress Notes (Signed)
[facility]  [facility] is providing the following services: RW and Commode  If patient discharges after hours, please call [phone].   Linward Headland 08/14/2013, 10:25 AM

## 2013-08-14 NOTE — Evaluation (Signed)
Physical Therapy Evaluation Patient Details Name: [name] MRN: [id number] DOB: [birth year] Today's Date: 08/14/2013 Time: 6301-6010 PT Time Calculation (min): 35 min  PT Assessment / Plan / Recommendation History of Present Illness  Pt is s/p L TKA  Clinical Impression  Pt s/p L TKR presents with decreased L LE strength/ROM and post op pain limiting functional mobility.  Pt should progress well to d/c home with family assist and HHPT follow up    PT Assessment  Patient needs continued PT services    Follow Up Recommendations  Home health PT    Does the patient have the potential to tolerate intense rehabilitation      Barriers to Discharge        Equipment Recommendations  Rolling walker with 5" wheels    Recommendations for Other Services OT consult   Frequency 7X/week    Precautions / Restrictions Precautions Precautions: Knee Restrictions Weight Bearing Restrictions: No Other Position/Activity Restrictions: WBAT   Pertinent Vitals/Pain 4/10; premed, cold packs provided      Mobility  Bed Mobility Overal bed mobility: Needs Assistance Bed Mobility: Supine to Sit Supine to sit: Min assist General bed mobility comments: cues for sequence and use or R LE to self assist Transfers Overall transfer level: Needs assistance Equipment used: Rolling walker (2 wheeled) Transfers: Sit to/from Stand Sit to Stand: Min assist General transfer comment: verbal cues for hand placement and LE management. Ambulation/Gait Ambulation/Gait assistance: Min assist Ambulation Distance (Feet): 70 Feet Assistive device: Rolling walker (2 wheeled) Gait Pattern/deviations: Step-to pattern;Shuffle;Decreased step length - right;Decreased step length - left;Antalgic;Trunk flexed General Gait Details: cues for sequence, posture, position from RW    Exercises Total Joint Exercises Ankle Circles/Pumps: AROM;Both;10 reps;Supine Quad Sets: AROM;Both;10 reps;Supine Heel Slides:  AAROM;Left;10 reps;Supine Hip ABduction/ADduction: AAROM;Left;10 reps;Supine   PT Diagnosis: Difficulty walking  PT Problem List: Decreased strength;Decreased range of motion;Decreased activity tolerance;Decreased mobility;Decreased knowledge of use of DME;Obesity;Pain PT Treatment Interventions: DME instruction;Gait training;Stair training;Functional mobility training;Therapeutic activities;Therapeutic exercise;Patient/family education     PT Goals(Current goals can be found in the care plan section) Acute Rehab PT Goals Patient Stated Goal: wants to regain independence. PT Goal Formulation: With patient Time For Goal Achievement: 08/20/13 Potential to Achieve Goals: Good  Visit Information  Last PT Received On: 08/14/13 Assistance Needed: +1 History of Present Illness: Pt is s/p L TKA       Prior Functioning  Home Living Family/patient expects to be discharged to:: Private residence Living Arrangements: Alone Available Help at Discharge: Family (granddaugther will be helping for a few weeks) Type of Home: House Home Access: Stairs to enter [employer] of Steps: 1 Entrance Stairs-Rails: None Home Layout: One level Home Equipment: None Additional Comments: Grandaughter will be assisting Prior Function Level of Independence: Independent Communication Communication: No difficulties Dominant Hand: Right    Cognition  Cognition Arousal/Alertness: Awake/alert Behavior During Therapy: WFL for tasks assessed/performed Overall Cognitive Status: Within Functional Limits for tasks assessed    Extremity/Trunk Assessment Upper Extremity Assessment Upper Extremity Assessment: Overall WFL for tasks assessed Lower Extremity Assessment Lower Extremity Assessment: LLE deficits/detail LLE Deficits / Details: 3/5 quads with AAROM at knee -10 - 60   Balance    End of Session PT - End of Session Equipment Utilized During Treatment: Gait belt Activity Tolerance: Patient  tolerated treatment well Patient left: in chair;with call bell/phone within reach;with family/visitor present Nurse Communication: Mobility status  GP     Kiron Osmun 08/14/2013, 12:48 PM

## 2013-08-14 NOTE — Progress Notes (Signed)
   CARE MANAGEMENT NOTE 08/14/2013  Patient:  [name], [name]   Account Number:  [id number]  Date Initiated:  08/14/2013  Documentation initiated by:  [facility]  Subjective/Objective Assessment:   LEFT TOTAL KNEE ARTHROPLASTY     Action/Plan:   [place]   Anticipated DC Date:  08/15/2013   Anticipated DC Plan:  [place]  CM consult      [facility] Choice  HOME HEALTH   Choice offered to / List presented to:  C-1 Patient   DME arranged  [place]  3-N-1      DME agency  [place] arranged  HH-2 PT      [place]   Status of service:  Completed, signed off Medicare Important Message given?   (If response is "NO", the following Medicare IM given date fields will be blank) Date Medicare IM given:   Date Additional Medicare IM given:    Discharge Disposition:  [place]  Per UR Regulation:    If discussed at Long Length of Stay Meetings, dates discussed:    Comments:  08/14/2013 1120 NCM spoke to pt and offered choice for [facility]. Pt agreeable to [facility] for [facility]. Pt is requesting RW and 3n1 for home. Pt will have son, grand-daughter and daughter at home to help. Jonnie Finner RN CCM Case Mgmt phone [phone]

## 2013-08-14 NOTE — Progress Notes (Signed)
Discharge teaching and handouts given to patient and patient's grand-daughter.  Patient discharged to home.

## 2013-08-15 DIAGNOSIS — Z471 Aftercare following joint replacement surgery: Secondary | ICD-10-CM | POA: Diagnosis not present

## 2013-08-15 DIAGNOSIS — E669 Obesity, unspecified: Secondary | ICD-10-CM | POA: Diagnosis not present

## 2013-08-15 DIAGNOSIS — IMO0001 Reserved for inherently not codable concepts without codable children: Secondary | ICD-10-CM | POA: Diagnosis not present

## 2013-08-15 DIAGNOSIS — Z96659 Presence of unspecified artificial knee joint: Secondary | ICD-10-CM | POA: Diagnosis not present

## 2013-08-15 NOTE — Discharge Summary (Signed)
Physician Discharge Summary  Patient ID: [name] MRN: [id number] DOB/AGE: [birth year] 73 y.o.  Admit date: 08/13/2013 Discharge date: 08/14/2013   Procedures:  Procedure(s) (LRB): LEFT TOTAL KNEE ARTHROPLASTY (Left)  Attending Physician:  Dr. Paralee Cancel   Admission Diagnoses:   Left knee OA / pain  Discharge Diagnoses:  Principal Problem:   S/P left TKA Active Problems:   Obese  Past Medical History  Diagnosis Date  . GERD (gastroesophageal reflux disease)   . Chest pain   . Edema     legs  . Allergic rhinitis   . Joint pain   . Thyroid nodule   . Restrictive lung disease   . Numbness     FINGERS RT HAND  . Arthritis   . Anxiety     HPI: [name], 73 y.o. female, has a history of pain and functional disability in the left knee due to arthritis and has failed non-surgical conservative treatments for greater than 12 weeks to includecorticosteriod injections, viscosupplementation injections and activity modification. Onset of symptoms was gradual, starting >10 years ago with gradually worsening course since that time. The patient noted prior procedures on the knee to include arthroscopy on the left knee(s). Patient currently rates pain in the left knee(s) at 10 out of 10 with activity. Patient has night pain, worsening of pain with activity and weight bearing, pain that interferes with activities of daily living, pain with passive range of motion, crepitus and joint swelling. Patient has evidence of periarticular osteophytes and joint space narrowing by imaging studies. There is no active infection. Risks, benefits and expectations were discussed with the patient. Risks including but not limited to the risk of anesthesia, blood clots, nerve damage, blood vessel damage, failure of the prosthesis, infection and up to and including death. Patient understand the risks, benefits and expectations and wishes to proceed with surgery.  PCP:  Melinda Crutch, MD    Discharged Condition: good  Hospital Course:  Patient underwent the above stated procedure on 08/13/2013. Patient tolerated the procedure well and brought to the recovery room in good condition and subsequently to the floor.  POD #1 BP: 125/69 ; Pulse: 79 ; Temp: 98 F (36.7 C) ; Resp: 18  Patient reports pain as mild, pain controlled. No events throughout the night. Ready to be discharged home. Neurovascular intact, dorsiflexion/plantar flexion intact, incision: dressing C/D/I, no cellulitis present and compartment soft.   LABS  Basename    HGB  12.4  HCT  38.5    Discharge Exam: General appearance: alert, cooperative and no distress Extremities: Homans sign is negative, no sign of DVT, no edema, redness or tenderness in the calves or thighs and no ulcers, gangrene or trophic changes  Disposition:     Home-Health Care Svc with follow up in 2 weeks   Follow-up Information   Follow up with Joy Pole, MD. Schedule an appointment as soon as possible for a visit in 2 weeks.   Specialty:  Orthopedic Surgery   Contact information:   [address] [place] [address] [phone]       Follow up with [facility]. [facility])    Contact information:   [address] [place] [address] [phone]       Discharge Orders   Future Orders Complete By Expires   Call MD / Call 911  As directed    Comments:     If you experience chest pain or shortness  of breath, CALL 911 and be transported to the hospital emergency room.  If you develope a fever above 101 F, pus (white drainage) or increased drainage or redness at the wound, or calf pain, call your surgeon's office.   Change dressing  As directed    Comments:     Maintain surgical dressing for 10-14 days, or until follow up in the clinic.   Constipation Prevention  As directed    Comments:     Drink plenty of fluids.  Prune juice may be helpful.  You may use a  stool softener, such as Colace (over the counter) 100 mg twice a day.  Use MiraLax (over the counter) for constipation as needed.   Diet - low sodium heart healthy  As directed    Discharge instructions  As directed    Comments:     Maintain surgical dressing for 10-14 days, or until follow up in the clinic. Follow up in 2 weeks at [name]. Call with any questions or concerns.   Increase activity slowly as tolerated  As directed    TED hose  As directed    Comments:     Use stockings (TED hose) for 2 weeks on both leg(s).  You may remove them at night for sleeping.   Weight bearing as tolerated  As directed    Questions:     Laterality:     Extremity:          Medication List    STOP taking these medications       acetaminophen 500 MG tablet  Commonly known as:  TYLENOL     ALKA-SELTZER PLS ALLERGY & CGH PO     ibuprofen 200 MG tablet  Commonly known as:  ADVIL,MOTRIN      TAKE these medications       albuterol 108 (90 BASE) MCG/ACT inhaler  Commonly known as:  PROVENTIL HFA;VENTOLIN HFA  Inhale 1 puff into the lungs every 6 (six) hours as needed for wheezing or shortness of breath.     aspirin 325 MG EC tablet  Take 1 tablet (325 mg total) by mouth 2 (two) times daily.     DSS 100 MG Caps  Take 100 mg by mouth 2 (two) times daily.     ferrous sulfate 325 (65 FE) MG tablet  Take 1 tablet (325 mg total) by mouth 3 (three) times daily after meals.     furosemide 20 MG tablet  Commonly known as:  LASIX  Take 20 mg by mouth as needed for fluid.     HYDROcodone-acetaminophen 5-325 MG per tablet  Commonly known as:  NORCO/VICODIN  Take 1-2 tablets by mouth every 4 (four) hours as needed for moderate pain.     nitroGLYCERIN 0.4 MG SL tablet  Commonly known as:  NITROSTAT  Place 0.4 mg under the tongue every 5 (five) minutes as needed for chest pain.     polyethylene glycol packet  Commonly known as:  MIRALAX / GLYCOLAX  Take 17 g by mouth 2 (two)  times daily.     tiZANidine 4 MG capsule  Commonly known as:  ZANAFLEX  Take 1 capsule (4 mg total) by mouth 3 (three) times daily as needed for muscle spasms.         Signed: West Pugh. Olis Viverette   PAC  08/15/2013, 8:45 AM

## 2013-08-16 DIAGNOSIS — IMO0001 Reserved for inherently not codable concepts without codable children: Secondary | ICD-10-CM | POA: Diagnosis not present

## 2013-08-16 DIAGNOSIS — Z96659 Presence of unspecified artificial knee joint: Secondary | ICD-10-CM | POA: Diagnosis not present

## 2013-08-16 DIAGNOSIS — Z471 Aftercare following joint replacement surgery: Secondary | ICD-10-CM | POA: Diagnosis not present

## 2013-08-16 DIAGNOSIS — E669 Obesity, unspecified: Secondary | ICD-10-CM | POA: Diagnosis not present

## 2013-08-19 DIAGNOSIS — Z471 Aftercare following joint replacement surgery: Secondary | ICD-10-CM | POA: Diagnosis not present

## 2013-08-19 DIAGNOSIS — Z96659 Presence of unspecified artificial knee joint: Secondary | ICD-10-CM | POA: Diagnosis not present

## 2013-08-19 DIAGNOSIS — E669 Obesity, unspecified: Secondary | ICD-10-CM | POA: Diagnosis not present

## 2013-08-19 DIAGNOSIS — IMO0001 Reserved for inherently not codable concepts without codable children: Secondary | ICD-10-CM | POA: Diagnosis not present

## 2013-08-21 DIAGNOSIS — Z471 Aftercare following joint replacement surgery: Secondary | ICD-10-CM | POA: Diagnosis not present

## 2013-08-21 DIAGNOSIS — E669 Obesity, unspecified: Secondary | ICD-10-CM | POA: Diagnosis not present

## 2013-08-21 DIAGNOSIS — IMO0001 Reserved for inherently not codable concepts without codable children: Secondary | ICD-10-CM | POA: Diagnosis not present

## 2013-08-21 DIAGNOSIS — Z96659 Presence of unspecified artificial knee joint: Secondary | ICD-10-CM | POA: Diagnosis not present

## 2013-08-23 DIAGNOSIS — E669 Obesity, unspecified: Secondary | ICD-10-CM | POA: Diagnosis not present

## 2013-08-23 DIAGNOSIS — Z96659 Presence of unspecified artificial knee joint: Secondary | ICD-10-CM | POA: Diagnosis not present

## 2013-08-23 DIAGNOSIS — Z471 Aftercare following joint replacement surgery: Secondary | ICD-10-CM | POA: Diagnosis not present

## 2013-08-23 DIAGNOSIS — IMO0001 Reserved for inherently not codable concepts without codable children: Secondary | ICD-10-CM | POA: Diagnosis not present

## 2013-08-26 DIAGNOSIS — IMO0001 Reserved for inherently not codable concepts without codable children: Secondary | ICD-10-CM | POA: Diagnosis not present

## 2013-08-26 DIAGNOSIS — E669 Obesity, unspecified: Secondary | ICD-10-CM | POA: Diagnosis not present

## 2013-08-26 DIAGNOSIS — Z471 Aftercare following joint replacement surgery: Secondary | ICD-10-CM | POA: Diagnosis not present

## 2013-08-26 DIAGNOSIS — Z96659 Presence of unspecified artificial knee joint: Secondary | ICD-10-CM | POA: Diagnosis not present

## 2013-08-28 DIAGNOSIS — Z96659 Presence of unspecified artificial knee joint: Secondary | ICD-10-CM | POA: Diagnosis not present

## 2013-08-28 DIAGNOSIS — E669 Obesity, unspecified: Secondary | ICD-10-CM | POA: Diagnosis not present

## 2013-08-28 DIAGNOSIS — Z471 Aftercare following joint replacement surgery: Secondary | ICD-10-CM | POA: Diagnosis not present

## 2013-08-28 DIAGNOSIS — IMO0001 Reserved for inherently not codable concepts without codable children: Secondary | ICD-10-CM | POA: Diagnosis not present

## 2013-08-29 DIAGNOSIS — L03119 Cellulitis of unspecified part of limb: Secondary | ICD-10-CM | POA: Diagnosis not present

## 2013-08-29 DIAGNOSIS — L02419 Cutaneous abscess of limb, unspecified: Secondary | ICD-10-CM | POA: Diagnosis not present

## 2013-08-29 DIAGNOSIS — M25569 Pain in unspecified knee: Secondary | ICD-10-CM | POA: Diagnosis not present

## 2013-08-30 DIAGNOSIS — Z96659 Presence of unspecified artificial knee joint: Secondary | ICD-10-CM | POA: Diagnosis not present

## 2013-08-30 DIAGNOSIS — IMO0001 Reserved for inherently not codable concepts without codable children: Secondary | ICD-10-CM | POA: Diagnosis not present

## 2013-08-30 DIAGNOSIS — E669 Obesity, unspecified: Secondary | ICD-10-CM | POA: Diagnosis not present

## 2013-08-30 DIAGNOSIS — Z471 Aftercare following joint replacement surgery: Secondary | ICD-10-CM | POA: Diagnosis not present

## 2013-09-03 ENCOUNTER — Ambulatory Visit: Payer: Medicare Other | Attending: Physician Assistant

## 2013-09-03 DIAGNOSIS — M6281 Muscle weakness (generalized): Secondary | ICD-10-CM | POA: Insufficient documentation

## 2013-09-03 DIAGNOSIS — M25669 Stiffness of unspecified knee, not elsewhere classified: Secondary | ICD-10-CM | POA: Insufficient documentation

## 2013-09-03 DIAGNOSIS — R262 Difficulty in walking, not elsewhere classified: Secondary | ICD-10-CM | POA: Diagnosis not present

## 2013-09-03 DIAGNOSIS — R17 Unspecified jaundice: Secondary | ICD-10-CM | POA: Insufficient documentation

## 2013-09-03 DIAGNOSIS — M25569 Pain in unspecified knee: Secondary | ICD-10-CM | POA: Diagnosis not present

## 2013-09-03 DIAGNOSIS — IMO0001 Reserved for inherently not codable concepts without codable children: Secondary | ICD-10-CM | POA: Insufficient documentation

## 2013-09-04 ENCOUNTER — Ambulatory Visit: Payer: Medicare Other | Attending: Physician Assistant | Admitting: Physical Therapy

## 2013-09-04 DIAGNOSIS — R262 Difficulty in walking, not elsewhere classified: Secondary | ICD-10-CM | POA: Insufficient documentation

## 2013-09-04 DIAGNOSIS — M6281 Muscle weakness (generalized): Secondary | ICD-10-CM | POA: Insufficient documentation

## 2013-09-04 DIAGNOSIS — M25669 Stiffness of unspecified knee, not elsewhere classified: Secondary | ICD-10-CM | POA: Insufficient documentation

## 2013-09-04 DIAGNOSIS — R17 Unspecified jaundice: Secondary | ICD-10-CM | POA: Insufficient documentation

## 2013-09-04 DIAGNOSIS — M25569 Pain in unspecified knee: Secondary | ICD-10-CM | POA: Insufficient documentation

## 2013-09-04 DIAGNOSIS — IMO0001 Reserved for inherently not codable concepts without codable children: Secondary | ICD-10-CM | POA: Insufficient documentation

## 2013-09-09 ENCOUNTER — Ambulatory Visit: Payer: Medicare Other | Admitting: Physical Therapy

## 2013-09-09 DIAGNOSIS — R17 Unspecified jaundice: Secondary | ICD-10-CM | POA: Diagnosis not present

## 2013-09-09 DIAGNOSIS — M6281 Muscle weakness (generalized): Secondary | ICD-10-CM | POA: Diagnosis not present

## 2013-09-09 DIAGNOSIS — R262 Difficulty in walking, not elsewhere classified: Secondary | ICD-10-CM | POA: Diagnosis not present

## 2013-09-09 DIAGNOSIS — M25569 Pain in unspecified knee: Secondary | ICD-10-CM | POA: Diagnosis not present

## 2013-09-09 DIAGNOSIS — M25669 Stiffness of unspecified knee, not elsewhere classified: Secondary | ICD-10-CM | POA: Diagnosis not present

## 2013-09-09 DIAGNOSIS — IMO0001 Reserved for inherently not codable concepts without codable children: Secondary | ICD-10-CM | POA: Diagnosis not present

## 2013-09-11 ENCOUNTER — Ambulatory Visit: Payer: Medicare Other | Admitting: Physical Therapy

## 2013-09-13 ENCOUNTER — Ambulatory Visit: Payer: Medicare Other | Admitting: Physical Therapy

## 2013-09-16 ENCOUNTER — Ambulatory Visit: Payer: Medicare Other | Admitting: Physical Therapy

## 2013-09-18 ENCOUNTER — Ambulatory Visit: Payer: Medicare Other | Admitting: Physical Therapy

## 2013-09-20 ENCOUNTER — Ambulatory Visit: Payer: Medicare Other | Admitting: Physical Therapy

## 2013-09-23 ENCOUNTER — Ambulatory Visit: Payer: Medicare Other | Admitting: Physical Therapy

## 2013-09-25 ENCOUNTER — Ambulatory Visit: Payer: Medicare Other

## 2013-09-27 ENCOUNTER — Ambulatory Visit: Payer: Medicare Other | Admitting: Physical Therapy

## 2013-09-30 ENCOUNTER — Ambulatory Visit: Payer: Medicare Other | Admitting: Physical Therapy

## 2013-10-02 ENCOUNTER — Ambulatory Visit: Payer: Medicare Other | Admitting: Physical Therapy

## 2013-10-02 DIAGNOSIS — Z96659 Presence of unspecified artificial knee joint: Secondary | ICD-10-CM | POA: Diagnosis not present

## 2013-10-04 ENCOUNTER — Ambulatory Visit: Payer: Medicare Other | Attending: Physician Assistant | Admitting: Physical Therapy

## 2013-10-04 DIAGNOSIS — IMO0001 Reserved for inherently not codable concepts without codable children: Secondary | ICD-10-CM | POA: Diagnosis not present

## 2013-10-04 DIAGNOSIS — M25569 Pain in unspecified knee: Secondary | ICD-10-CM | POA: Diagnosis not present

## 2013-10-04 DIAGNOSIS — R262 Difficulty in walking, not elsewhere classified: Secondary | ICD-10-CM | POA: Insufficient documentation

## 2013-10-04 DIAGNOSIS — M25669 Stiffness of unspecified knee, not elsewhere classified: Secondary | ICD-10-CM | POA: Diagnosis not present

## 2013-10-04 DIAGNOSIS — M6281 Muscle weakness (generalized): Secondary | ICD-10-CM | POA: Diagnosis not present

## 2013-10-04 DIAGNOSIS — R17 Unspecified jaundice: Secondary | ICD-10-CM | POA: Insufficient documentation

## 2013-10-07 ENCOUNTER — Ambulatory Visit: Payer: Medicare Other | Admitting: Physical Therapy

## 2013-10-09 ENCOUNTER — Ambulatory Visit: Payer: Medicare Other | Admitting: Physical Therapy

## 2013-10-11 DIAGNOSIS — M1710 Unilateral primary osteoarthritis, unspecified knee: Secondary | ICD-10-CM | POA: Diagnosis not present

## 2013-10-11 DIAGNOSIS — Z96659 Presence of unspecified artificial knee joint: Secondary | ICD-10-CM | POA: Diagnosis not present

## 2013-10-11 DIAGNOSIS — M25569 Pain in unspecified knee: Secondary | ICD-10-CM | POA: Diagnosis not present

## 2013-10-15 ENCOUNTER — Ambulatory Visit: Payer: Medicare Other | Admitting: Physical Therapy

## 2013-10-16 ENCOUNTER — Ambulatory Visit: Payer: Medicare Other | Admitting: Physical Therapy

## 2013-10-17 DIAGNOSIS — Z4789 Encounter for other orthopedic aftercare: Secondary | ICD-10-CM | POA: Diagnosis not present

## 2013-10-17 DIAGNOSIS — M1710 Unilateral primary osteoarthritis, unspecified knee: Secondary | ICD-10-CM | POA: Diagnosis not present

## 2013-10-18 ENCOUNTER — Encounter: Payer: Medicare Other | Admitting: Physical Therapy

## 2013-10-21 ENCOUNTER — Ambulatory Visit: Payer: Medicare Other | Admitting: Physical Therapy

## 2013-10-23 ENCOUNTER — Ambulatory Visit: Payer: Medicare Other

## 2013-10-24 DIAGNOSIS — M25569 Pain in unspecified knee: Secondary | ICD-10-CM | POA: Diagnosis not present

## 2013-10-24 DIAGNOSIS — M1710 Unilateral primary osteoarthritis, unspecified knee: Secondary | ICD-10-CM | POA: Diagnosis not present

## 2013-10-25 ENCOUNTER — Encounter: Payer: Medicare Other | Admitting: Physical Therapy

## 2013-11-04 ENCOUNTER — Ambulatory Visit: Payer: Medicare Other | Attending: Physician Assistant | Admitting: Physical Therapy

## 2013-11-04 DIAGNOSIS — M6281 Muscle weakness (generalized): Secondary | ICD-10-CM | POA: Diagnosis not present

## 2013-11-04 DIAGNOSIS — IMO0001 Reserved for inherently not codable concepts without codable children: Secondary | ICD-10-CM | POA: Insufficient documentation

## 2013-11-04 DIAGNOSIS — R262 Difficulty in walking, not elsewhere classified: Secondary | ICD-10-CM | POA: Diagnosis not present

## 2013-11-04 DIAGNOSIS — M25669 Stiffness of unspecified knee, not elsewhere classified: Secondary | ICD-10-CM | POA: Insufficient documentation

## 2013-11-04 DIAGNOSIS — M25569 Pain in unspecified knee: Secondary | ICD-10-CM | POA: Insufficient documentation

## 2013-11-04 DIAGNOSIS — R17 Unspecified jaundice: Secondary | ICD-10-CM | POA: Diagnosis not present

## 2013-11-06 ENCOUNTER — Ambulatory Visit: Payer: Medicare Other

## 2013-11-11 ENCOUNTER — Ambulatory Visit: Payer: Medicare Other | Admitting: Physical Therapy

## 2013-11-13 ENCOUNTER — Ambulatory Visit: Payer: Medicare Other | Admitting: Physical Therapy

## 2013-11-18 ENCOUNTER — Ambulatory Visit: Payer: Medicare Other | Admitting: Physical Therapy

## 2013-11-20 ENCOUNTER — Ambulatory Visit: Payer: Medicare Other

## 2013-11-25 ENCOUNTER — Ambulatory Visit: Payer: Medicare Other

## 2013-11-27 ENCOUNTER — Ambulatory Visit: Payer: Medicare Other

## 2013-12-02 ENCOUNTER — Ambulatory Visit: Payer: Medicare Other | Admitting: Physical Therapy

## 2013-12-04 ENCOUNTER — Ambulatory Visit: Payer: Medicare Other | Attending: Physician Assistant

## 2013-12-04 DIAGNOSIS — IMO0001 Reserved for inherently not codable concepts without codable children: Secondary | ICD-10-CM | POA: Diagnosis not present

## 2013-12-04 DIAGNOSIS — M25569 Pain in unspecified knee: Secondary | ICD-10-CM | POA: Insufficient documentation

## 2013-12-04 DIAGNOSIS — M25669 Stiffness of unspecified knee, not elsewhere classified: Secondary | ICD-10-CM | POA: Diagnosis not present

## 2013-12-04 DIAGNOSIS — R17 Unspecified jaundice: Secondary | ICD-10-CM | POA: Insufficient documentation

## 2013-12-04 DIAGNOSIS — R262 Difficulty in walking, not elsewhere classified: Secondary | ICD-10-CM | POA: Insufficient documentation

## 2013-12-04 DIAGNOSIS — Z96659 Presence of unspecified artificial knee joint: Secondary | ICD-10-CM | POA: Diagnosis not present

## 2013-12-04 DIAGNOSIS — M6281 Muscle weakness (generalized): Secondary | ICD-10-CM | POA: Insufficient documentation

## 2014-01-08 DIAGNOSIS — L819 Disorder of pigmentation, unspecified: Secondary | ICD-10-CM | POA: Diagnosis not present

## 2014-01-08 DIAGNOSIS — D1801 Hemangioma of skin and subcutaneous tissue: Secondary | ICD-10-CM | POA: Diagnosis not present

## 2014-01-08 DIAGNOSIS — L820 Inflamed seborrheic keratosis: Secondary | ICD-10-CM | POA: Diagnosis not present

## 2014-01-08 DIAGNOSIS — D485 Neoplasm of uncertain behavior of skin: Secondary | ICD-10-CM | POA: Diagnosis not present

## 2014-01-08 DIAGNOSIS — L821 Other seborrheic keratosis: Secondary | ICD-10-CM | POA: Diagnosis not present

## 2014-01-13 DIAGNOSIS — J069 Acute upper respiratory infection, unspecified: Secondary | ICD-10-CM | POA: Diagnosis not present

## 2014-02-01 DIAGNOSIS — R05 Cough: Secondary | ICD-10-CM | POA: Diagnosis not present

## 2014-02-01 DIAGNOSIS — R059 Cough, unspecified: Secondary | ICD-10-CM | POA: Diagnosis not present

## 2014-04-14 DIAGNOSIS — E041 Nontoxic single thyroid nodule: Secondary | ICD-10-CM | POA: Diagnosis not present

## 2014-04-14 DIAGNOSIS — K635 Polyp of colon: Secondary | ICD-10-CM | POA: Diagnosis not present

## 2014-04-14 DIAGNOSIS — J329 Chronic sinusitis, unspecified: Secondary | ICD-10-CM | POA: Diagnosis not present

## 2014-04-14 DIAGNOSIS — E559 Vitamin D deficiency, unspecified: Secondary | ICD-10-CM | POA: Diagnosis not present

## 2014-04-14 DIAGNOSIS — J309 Allergic rhinitis, unspecified: Secondary | ICD-10-CM | POA: Diagnosis not present

## 2014-04-14 DIAGNOSIS — Z23 Encounter for immunization: Secondary | ICD-10-CM | POA: Diagnosis not present

## 2014-04-14 DIAGNOSIS — R21 Rash and other nonspecific skin eruption: Secondary | ICD-10-CM | POA: Diagnosis not present

## 2014-04-14 DIAGNOSIS — E780 Pure hypercholesterolemia: Secondary | ICD-10-CM | POA: Diagnosis not present

## 2014-04-14 DIAGNOSIS — Z131 Encounter for screening for diabetes mellitus: Secondary | ICD-10-CM | POA: Diagnosis not present

## 2014-04-16 ENCOUNTER — Telehealth: Payer: Self-pay | Admitting: Internal Medicine

## 2014-05-07 ENCOUNTER — Encounter: Payer: Self-pay | Admitting: Internal Medicine

## 2014-05-07 DIAGNOSIS — J45909 Unspecified asthma, uncomplicated: Secondary | ICD-10-CM | POA: Diagnosis not present

## 2014-05-16 DIAGNOSIS — J45909 Unspecified asthma, uncomplicated: Secondary | ICD-10-CM | POA: Diagnosis not present

## 2014-05-16 DIAGNOSIS — R062 Wheezing: Secondary | ICD-10-CM | POA: Diagnosis not present

## 2014-05-24 DIAGNOSIS — R05 Cough: Secondary | ICD-10-CM | POA: Diagnosis not present

## 2014-06-16 NOTE — Telephone Encounter (Signed)
Pt scheduled then cancelled

## 2014-06-17 ENCOUNTER — Encounter: Payer: Self-pay | Admitting: Internal Medicine

## 2014-06-17 ENCOUNTER — Encounter: Payer: Self-pay | Admitting: Pulmonary Disease

## 2014-06-17 ENCOUNTER — Ambulatory Visit (INDEPENDENT_AMBULATORY_CARE_PROVIDER_SITE_OTHER): Payer: Medicare Other | Admitting: Pulmonary Disease

## 2014-06-17 VITALS — BP 126/72 | HR 87 | Temp 97.0°F | Ht 67.0 in | Wt 236.0 lb

## 2014-06-17 DIAGNOSIS — R9389 Abnormal findings on diagnostic imaging of other specified body structures: Secondary | ICD-10-CM

## 2014-06-17 DIAGNOSIS — J849 Interstitial pulmonary disease, unspecified: Secondary | ICD-10-CM | POA: Insufficient documentation

## 2014-06-17 DIAGNOSIS — J0191 Acute recurrent sinusitis, unspecified: Secondary | ICD-10-CM | POA: Diagnosis not present

## 2014-06-17 DIAGNOSIS — R938 Abnormal findings on diagnostic imaging of other specified body structures: Secondary | ICD-10-CM | POA: Diagnosis not present

## 2014-06-17 NOTE — Progress Notes (Signed)
Subjective:    Patient ID: [name], female    DOB: 18-Oct-1940, 74 y.o.   MRN: [id number]  HPI The patient is a 74 year old female who I've been asked to see for recurrent cough and abnormal chest x-ray. The patient has been seen in our office in the distant past, and was found to have interstitial lung disease on chest x-ray. She underwent a CT chest in 2008 that is not available for my review, but the report states she had mild bronchiectasis, and underlying interstitial changes worrisome for nonspecific interstitial pneumonitis. She apparently had PFTs at that time, and the results are currently not available. She tells me that she did not have pulmonary follow-up since that time, but has had issues with recurrent sinus and upper airway infections since March of last year. Her symptoms always start with sinus pressure and purulence, and then moves into her throat and upper chest. Most recently, she has been treated with multiple rounds of antibiotics, including Augmentin/Levaquin/Cipro/Omnicef.  The patient states that she will typically improved, but then have a recurrence of her symptoms shortly thereafter. Her most recent episode occurred in December of this year with her typical sinus symptoms, and was slow to respond. She currently has postnasal drip, but her cough has resolved. The patient has seen otolaryngology in the past, but has never been told that she has chronic sinusitis. She has had a follow-up chest x-ray in December of last year which shows a progressive interstitial process compared to 2012. The patient has chronic dyspnea on exertion, but does not feel that it interferes with her quality of life, nor has it been overly progressive. She is a total nonsmoker.   Review of Systems  Constitutional: Negative for fever and unexpected weight change.  HENT: Positive for congestion and postnasal drip. Negative for dental problem, ear pain, nosebleeds, rhinorrhea, sinus pressure,  sneezing, sore throat and trouble swallowing.   Eyes: Negative for redness and itching.  Respiratory: Positive for cough and shortness of breath. Negative for chest tightness and wheezing.   Cardiovascular: Negative for palpitations and leg swelling.  Gastrointestinal: Negative for nausea and vomiting.  Genitourinary: Negative for dysuria.  Musculoskeletal: Positive for arthralgias. Negative for joint swelling.  Skin: Negative for rash.  Neurological: Negative for headaches.  Hematological: Does not bruise/bleed easily.  Psychiatric/Behavioral: Negative for dysphoric mood. The patient is not nervous/anxious.        Objective:   Physical Exam Constitutional:  Overweight female, no acute distress  HENT:  Nares patent without discharge  Oropharynx without exudate, palate and uvula are normal  Eyes:  Perrla, eomi, no scleral icterus  Neck:  No JVD, no TMG  Cardiovascular:  Normal rate, regular rhythm, no rubs or gallops.  No murmurs        Intact distal pulses  Pulmonary :  Normal breath sounds, no stridor or respiratory distress   No rhonchi, or wheezing.  Mild bibasilar crackles noted  Abdominal:  Soft, nondistended, bowel sounds present.  No tenderness noted.   Musculoskeletal:  Mild lower extremity edema noted.  Lymph Nodes:  No cervical lymphadenopathy noted  Skin:  No cyanosis noted  Neurologic:  Alert, appropriate, moves all 4 extremities without obvious deficit.         Assessment & Plan:

## 2014-06-17 NOTE — Assessment & Plan Note (Signed)
The patient has had multiple of what sounds like acute sinusitis, and is treated with antibiotics with only a partial response. Her history is most suggestive of chronic sinusitis, and she has seen otolaryngology in the past. I would like to schedule for a limited CT of the sinuses, and if she has a lot of chronic changes and fluid levels, she will need further ENT input. She would probably also need a prolonged course of antibiotics over 3 weeks.

## 2014-06-17 NOTE — Assessment & Plan Note (Signed)
The patient clearly has significant interstitial changes on her chest x-ray, and this has progressed since 2012. A CT chest in 2008 that is unavailable for my review, but the report describes mild bronchiectasis and questionable nonspecific interstitial pneumonitis. The patient has chronic dyspnea on exertion, but doesn't feel that it interferes with her quality of life. At this point, I am hoping that her chest x-ray looks worse than her CT, but she will need a high-resolution scan to evaluate for interstitial lung disease. With her recurrent sinusitis and cough, perhaps this is all related to progressive bronchiectasis.

## 2014-06-17 NOTE — Patient Instructions (Signed)
Will schedule for scan of your chest and sinuses.  Will arrange for followup once the results are available.

## 2014-06-18 ENCOUNTER — Other Ambulatory Visit: Payer: Medicare Other

## 2014-06-24 ENCOUNTER — Ambulatory Visit (INDEPENDENT_AMBULATORY_CARE_PROVIDER_SITE_OTHER)
Admission: RE | Admit: 2014-06-24 | Discharge: 2014-06-24 | Disposition: A | Discharge status: Home / Self Care | Payer: Medicare Other | Source: Ambulatory Visit | Attending: Pulmonary Disease | Admitting: Pulmonary Disease

## 2014-06-24 DIAGNOSIS — J984 Other disorders of lung: Secondary | ICD-10-CM | POA: Diagnosis not present

## 2014-06-24 DIAGNOSIS — R938 Abnormal findings on diagnostic imaging of other specified body structures: Secondary | ICD-10-CM | POA: Diagnosis not present

## 2014-06-24 DIAGNOSIS — R9389 Abnormal findings on diagnostic imaging of other specified body structures: Secondary | ICD-10-CM

## 2014-06-24 DIAGNOSIS — J0191 Acute recurrent sinusitis, unspecified: Secondary | ICD-10-CM

## 2014-06-24 DIAGNOSIS — J3489 Other specified disorders of nose and nasal sinuses: Secondary | ICD-10-CM | POA: Diagnosis not present

## 2014-06-30 ENCOUNTER — Encounter: Payer: Self-pay | Admitting: Pulmonary Disease

## 2014-06-30 ENCOUNTER — Ambulatory Visit (INDEPENDENT_AMBULATORY_CARE_PROVIDER_SITE_OTHER): Payer: Medicare Other | Admitting: Pulmonary Disease

## 2014-06-30 VITALS — BP 134/76 | HR 96 | Temp 97.8°F | Ht 67.5 in | Wt 235.4 lb

## 2014-06-30 DIAGNOSIS — J479 Bronchiectasis, uncomplicated: Secondary | ICD-10-CM | POA: Diagnosis not present

## 2014-06-30 DIAGNOSIS — J849 Interstitial pulmonary disease, unspecified: Secondary | ICD-10-CM

## 2014-06-30 DIAGNOSIS — J471 Bronchiectasis with (acute) exacerbation: Secondary | ICD-10-CM | POA: Insufficient documentation

## 2014-06-30 NOTE — Patient Instructions (Signed)
Will give you a sputum cup to collect a specimen when you are getting sick, not now.  Keep in fridge until you can get to [place], and try to bring within 2 hrs.  Early am specimen usually the best. Will schedule for breathing studies to compare to prior, then we can talk about whether to do a lung biopsy or not. A lot of your current cough is related to your upper airway, not your lungs.  Try hard candy, avoid throat clearing.

## 2014-06-30 NOTE — Progress Notes (Signed)
   Subjective:    Patient ID: [name], female    DOB: September 19, 1940, 74 y.o.   MRN: [id number]  HPI The patient comes in today for follow-up of her recent CT chest and sinuses, as part of a workup for her dyspnea and cough with mucus. 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  I have reviewed the studies with her in detail, and also her son, and answered all of their questions.   Review of Systems  Constitutional: Negative for fever and unexpected weight change.  HENT: Positive for congestion and postnasal drip. Negative for dental problem, ear pain, nosebleeds, rhinorrhea, sinus pressure, sneezing, sore throat and trouble swallowing.   Eyes: Negative for redness and itching.  Respiratory: Positive for cough, chest tightness, shortness of breath and wheezing.   Cardiovascular: Negative for palpitations and leg swelling.  Gastrointestinal: Negative for nausea and vomiting.  Genitourinary: Negative for dysuria.  Musculoskeletal: Negative for joint swelling.  Skin: Negative for rash.  Neurological: Negative for headaches.  Hematological: Does not bruise/bleed easily.  Psychiatric/Behavioral: Negative for dysphoric mood. The patient is not nervous/anxious.        Objective:   Physical Exam Obese female in no acute distress Nose without purulence or discharge noted Neck without lymphadenopathy or thyromegaly Lower extremities with mild edema, no cyanosis Alert and oriented, moves all 4 extremities.       Assessment & Plan:

## 2014-06-30 NOTE — Assessment & Plan Note (Signed)
The patient clearly has bronchiectasis on her most recent high-resolution CT, and more than likely this is responsible for her airway symptoms that she describes. Although, I think a lot of her current cough and mucus is coming from her upper airway with postnasal drip and the upper airway cough syndrome. I would like to culture her mucus at the time of her next flare, and be able to identify her colonizing flora. This will make antibiotic selection going forward much easier.

## 2014-06-30 NOTE — Assessment & Plan Note (Signed)
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  The good news here is that it does not appear significantly different from 2008, though it is a little worse. There is nothing by history suggestive of the cause of her interstitial process, although she did work in [employer] for a period of time. I would like to repeat her pulmonary function studies to see if there has been a big decline. If there has, then I would be more inclined to do a thoracoscopic biopsy.  If these are very stable from 2009, along with her chest CT showing only minimal change over the years, it may be more prudent to follow this clinically and with serial PFTs/x-rays.

## 2014-07-04 ENCOUNTER — Ambulatory Visit (HOSPITAL_COMMUNITY)
Admission: RE | Admit: 2014-07-04 | Discharge: 2014-07-04 | Disposition: A | Discharge status: Home / Self Care | Payer: Medicare Other | Source: Ambulatory Visit | Attending: Pulmonary Disease | Admitting: Pulmonary Disease

## 2014-07-04 DIAGNOSIS — J849 Interstitial pulmonary disease, unspecified: Secondary | ICD-10-CM | POA: Diagnosis present

## 2014-07-04 DIAGNOSIS — R05 Cough: Secondary | ICD-10-CM | POA: Insufficient documentation

## 2014-07-04 LAB — PULMONARY FUNCTION TEST
DL/VA % pred: 79 %
DL/VA: 4.07 ml/min/mmHg/L
DLCO unc % pred: 48 %
DLCO unc: 13.7 ml/min/mmHg
FEF 25-75 Post: 2.85 L/sec
FEF 25-75 Pre: 2.46 L/sec
FEF2575-%Change-Post: 16 %
FEF2575-%Pred-Post: 154 %
FEF2575-%Pred-Pre: 132 %
FEV1-%Change-Post: 5 %
FEV1-%Pred-Post: 83 %
FEV1-%Pred-Pre: 78 %
FEV1-Post: 2 L
FEV1-Pre: 1.9 L
FEV1FVC-%Change-Post: -1 %
FEV1FVC-%Pred-Pre: 119 %
FEV6-%Change-Post: 5 %
FEV6-%Pred-Post: 73 %
FEV6-%Pred-Pre: 69 %
FEV6-Post: 2.23 L
FEV6-Pre: 2.13 L
FEV6FVC-%Pred-Post: 105 %
FEV6FVC-%Pred-Pre: 105 %
FVC-%Change-Post: 6 %
FVC-%Pred-Post: 70 %
FVC-%Pred-Pre: 66 %
FVC-Post: 2.26 L
FVC-Pre: 2.13 L
Post FEV1/FVC ratio: 88 %
Post FEV6/FVC ratio: 100 %
Pre FEV1/FVC ratio: 89 %
Pre FEV6/FVC Ratio: 100 %
RV % pred: 58 %
RV: 1.43 L
TLC % pred: 72 %
TLC: 4 L

## 2014-07-04 MED ORDER — ALBUTEROL SULFATE (2.5 MG/3ML) 0.083% IN NEBU
2.5000 mg | INHALATION_SOLUTION | Freq: Once | RESPIRATORY_TRACT | Status: AC
  Administered 2014-07-04: 2.5 mg via RESPIRATORY_TRACT

## 2014-07-07 ENCOUNTER — Other Ambulatory Visit: Payer: Self-pay | Admitting: Pulmonary Disease

## 2014-07-07 DIAGNOSIS — J849 Interstitial pulmonary disease, unspecified: Secondary | ICD-10-CM

## 2014-07-09 ENCOUNTER — Other Ambulatory Visit (INDEPENDENT_AMBULATORY_CARE_PROVIDER_SITE_OTHER): Payer: Medicare Other

## 2014-07-09 DIAGNOSIS — J849 Interstitial pulmonary disease, unspecified: Secondary | ICD-10-CM

## 2014-07-09 LAB — SEDIMENTATION RATE: Sed Rate: 17 mm/hr (ref 0–22)

## 2014-07-10 LAB — ANGIOTENSIN CONVERTING ENZYME: Angiotensin-Converting Enzyme: 29 U/L (ref 8–52)

## 2014-07-10 LAB — ANA: Anti Nuclear Antibody(ANA): NEGATIVE

## 2014-07-10 LAB — ANTI-DNA ANTIBODY, DOUBLE-STRANDED: ds DNA Ab: <1 IU/mL

## 2014-07-10 LAB — CYCLIC CITRUL PEPTIDE ANTIBODY, IGG: Cyclic Citrullin Peptide Ab: <2 U/mL (ref 0.0–5.0)

## 2014-07-10 LAB — ANTI-SCLERODERMA ANTIBODY: Scleroderma (Scl-70) (ENA) Antibody, IgG: <1

## 2014-07-10 LAB — SJOGRENS SYNDROME-A EXTRACTABLE NUCLEAR ANTIBODY: SSA (Ro) (ENA) Antibody, IgG: <1

## 2014-07-10 LAB — SJOGRENS SYNDROME-B EXTRACTABLE NUCLEAR ANTIBODY: SSB (La) (ENA) Antibody, IgG: <1

## 2014-07-11 ENCOUNTER — Other Ambulatory Visit: Payer: Medicare Other

## 2014-07-11 DIAGNOSIS — J849 Interstitial pulmonary disease, unspecified: Secondary | ICD-10-CM | POA: Diagnosis not present

## 2014-07-11 DIAGNOSIS — J479 Bronchiectasis, uncomplicated: Secondary | ICD-10-CM | POA: Diagnosis not present

## 2014-07-11 LAB — ALDOLASE: Aldolase: 4.6 U/L (ref <=8.1)

## 2014-07-14 ENCOUNTER — Telehealth: Payer: Self-pay | Admitting: Pulmonary Disease

## 2014-07-14 LAB — RESPIRATORY CULTURE OR RESPIRATORY AND SPUTUM CULTURE
Culture: NORMAL
Gram Stain: NONE SEEN
Gram Stain: NONE SEEN
Organism ID, Bacteria: NORMAL

## 2014-07-14 NOTE — Progress Notes (Signed)
Quick Note:  Spoke with pt regarding results. She verbalized understanding and will inform [facility] when she wants to consider a lung bx. Pt voiced no further questions or concerns at this time. ______

## 2014-07-14 NOTE — Telephone Encounter (Signed)
Notes Recorded by Kathee Delton, MD on 07/11/2014 at 6:07 PM Please let pt know that her autoimmune bloodwork is all negative. Let us know when she wants to consider a lung biopsy to help [place] with diagnosis and treatment.  ----------  Spoke with pt.  Discussed lab results per Dr. Gwenette Greet.  She verbalized understanding and states she will let us know when she wants to consider lung bx.  She verbalized understanding of results and voiced no further questions or concerns at this time.

## 2014-07-15 ENCOUNTER — Telehealth: Payer: Self-pay | Admitting: Pulmonary Disease

## 2014-07-15 ENCOUNTER — Encounter: Payer: Medicare Other | Admitting: Internal Medicine

## 2014-07-15 NOTE — Progress Notes (Signed)
Quick Note:  Pt aware of results. ______

## 2014-07-15 NOTE — Telephone Encounter (Signed)
Spoke with pt, states she was returning a call from this morning.  Relayed sputum results.  Nothing further needed.

## 2014-08-08 DIAGNOSIS — M65341 Trigger finger, right ring finger: Secondary | ICD-10-CM | POA: Diagnosis not present

## 2014-08-08 DIAGNOSIS — M65331 Trigger finger, right middle finger: Secondary | ICD-10-CM | POA: Diagnosis not present

## 2014-08-23 LAB — AFB CULTURE WITH SMEAR (NOT AT ARMC): Acid Fast Smear: NONE SEEN

## 2014-09-08 DIAGNOSIS — M65341 Trigger finger, right ring finger: Secondary | ICD-10-CM | POA: Diagnosis not present

## 2014-09-08 DIAGNOSIS — M65331 Trigger finger, right middle finger: Secondary | ICD-10-CM | POA: Diagnosis not present

## 2014-09-30 ENCOUNTER — Encounter (INDEPENDENT_AMBULATORY_CARE_PROVIDER_SITE_OTHER): Payer: Self-pay

## 2014-09-30 ENCOUNTER — Ambulatory Visit (INDEPENDENT_AMBULATORY_CARE_PROVIDER_SITE_OTHER): Payer: Medicare Other | Admitting: Pulmonary Disease

## 2014-09-30 ENCOUNTER — Encounter: Payer: Self-pay | Admitting: Pulmonary Disease

## 2014-09-30 VITALS — BP 140/82 | HR 83 | Temp 98.4°F | Ht 67.5 in | Wt 234.6 lb

## 2014-09-30 DIAGNOSIS — J849 Interstitial pulmonary disease, unspecified: Secondary | ICD-10-CM

## 2014-09-30 NOTE — Progress Notes (Signed)
   Subjective:    Patient ID: [name], female    DOB: 07-May-1941, 74 y.o.   MRN: [id number]  HPI The patient comes in today for follow-up of her interstitial lung disease. 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 She has had pulmonary function studies that show restriction and a severe decrease in her diffusion capacity, both of which are worse from 2009 as well. Her autoimmune panel was unremarkable. I have asked her to consider a thoracoscopic lung biopsy, and she is here today to discuss further.   Review of Systems  Constitutional: Negative for fever and unexpected weight change.  HENT: Positive for congestion and postnasal drip. Negative for dental problem, ear pain, nosebleeds, rhinorrhea, sinus pressure, sneezing, sore throat and trouble swallowing.   Eyes: Negative for redness and itching.  Respiratory: Positive for cough and shortness of breath. Negative for chest tightness and wheezing.   Cardiovascular: Negative for palpitations and leg swelling.  Gastrointestinal: Negative for nausea and vomiting.  Genitourinary: Negative for dysuria.  Musculoskeletal: Negative for joint swelling.  Skin: Negative for rash.  Neurological: Negative for headaches.  Hematological: Does not bruise/bleed easily.  Psychiatric/Behavioral: Negative for dysphoric mood. The patient is not nervous/anxious.        Objective:   Physical Exam Obese female in no acute distress Nose without purulence or discharge noted Neck without lymphadenopathy or thyromegaly Lower extremities with mild edema, no cyanosis Alert and oriented, moves all 4 extremities.       Assessment & Plan:

## 2014-09-30 NOTE — Assessment & Plan Note (Addendum)
The patient has interstitial lung disease that has mildly worsened from 2009 by CT chest and PFTs. 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  Of note, her autoimmune panel was unremarkable. I have reviewed all of this with her again, and have recommended that she have a thoracoscopic biopsy for diagnosis. She is not sure she wants to do this, and wants to think about it over time. We can certainly monitor her closely with x-rays and PFTs, but I have recommended having the biopsy.  Time spent with pt and her daughter discussing all of the above was 79mn.

## 2014-09-30 NOTE — Patient Instructions (Signed)
Would recommend proceeding with a long biopsy to establish a definitive diagnosis.  Please think about this, and let us know If the decision is made to not proceed with biopsy, would like to see you back in 17mo to check on things.  Can do followup xrays and breathing studies yearly to follow this. Try zyrtec 172meach night at bedtime to see if helps with cough

## 2014-10-01 DIAGNOSIS — Z471 Aftercare following joint replacement surgery: Secondary | ICD-10-CM | POA: Diagnosis not present

## 2014-10-01 DIAGNOSIS — Z96652 Presence of left artificial knee joint: Secondary | ICD-10-CM | POA: Diagnosis not present

## 2014-10-13 ENCOUNTER — Telehealth: Payer: Self-pay | Admitting: Pulmonary Disease

## 2014-10-13 ENCOUNTER — Other Ambulatory Visit: Payer: Self-pay | Admitting: Pulmonary Disease

## 2014-10-13 DIAGNOSIS — J849 Interstitial pulmonary disease, unspecified: Secondary | ICD-10-CM

## 2014-10-13 NOTE — Telephone Encounter (Signed)
Per 09/30/14 OV: Patient Instructions       Would recommend proceeding with a long biopsy to establish a definitive diagnosis.  Please think about this, and let us know If the decision is made to not proceed with biopsy, would like to see you back in 68mo to check on things.  Can do followup xrays and breathing studies yearly to follow this. Try zyrtec 137meach night at bedtime to see if helps with cough  --  Called spoke with pt. She reports she is wanting to proceed with the long biopsy and also wants to know who KCDennehotsoanted her to f/u with once he leaves. Please advise thanks

## 2014-10-13 NOTE — Telephone Encounter (Signed)
I will send an order to refer her to the surgeon for the lung biopsy.  Hopefully this will be done before I leave. I would then set her up with Dr. Lake Bells after I leave.  I think this would be a good fit for her.

## 2014-10-13 NOTE — Telephone Encounter (Signed)
Spoke with pt, she is aware that [facility] is referring to surgeon for lung biopsy and of her placement with Dr. Lake Bells.  Nothing further needed at this time.

## 2014-10-20 ENCOUNTER — Institutional Professional Consult (permissible substitution) (INDEPENDENT_AMBULATORY_CARE_PROVIDER_SITE_OTHER): Payer: Medicare Other | Admitting: Thoracic Surgery (Cardiothoracic Vascular Surgery)

## 2014-10-20 ENCOUNTER — Encounter: Payer: Self-pay | Admitting: Thoracic Surgery (Cardiothoracic Vascular Surgery)

## 2014-10-20 VITALS — BP 178/85 | HR 92 | Resp 18 | Ht 67.0 in | Wt 233.0 lb

## 2014-10-20 DIAGNOSIS — J849 Interstitial pulmonary disease, unspecified: Secondary | ICD-10-CM | POA: Diagnosis not present

## 2014-10-20 NOTE — Progress Notes (Signed)
PCP is  Melinda Crutch, MD Referring Provider is Clance, Armando Reichert, MD  Chief Complaint  Patient presents with  . Shortness of Breath    ILD...eval for lung bx...PFT'S 07/04/14    HPI: 74 year old woman is with a chief complaint of shortness of breath and wheezing.  [name] is a 74 year old woman who has a history of interstitial lung disease dating back to 2008. She is followed by Dr. Danton Sewer. She had a high resolution CT back in 2008 that suggested fibrosing NSIP. Pulmonary function testing showed restrictive disease and impairment of her diffusion capacity.  She recently has been having more shortness of breath with exertion. She says she can easily get up one flight of stairs, but her children noted her being short of breath after climbing 2 flights recently. She has a frequent productive cough. Sputum is usually yellow or clear. She has been having more frequent wheezing recently. She does not have any shortness of breath at rest.  She recently saw Dr. Gwenette Greet in follow-up. Pulmonary function testing showed progression of her restrictive disease and worsening diffusion capacity. A repeat high-resolution CT showed progressive changes again suggestive of fibrosing NSIP.  He recommended that she have a lung biopsy. She is reluctant to undergo a surgical procedure due to issues around the time of her knee replacement.   Past Medical History  Diagnosis Date  . GERD (gastroesophageal reflux disease)   . Chest pain   . Edema     legs  . Allergic rhinitis   . Joint pain   . Thyroid nodule   . Restrictive lung disease   . Numbness     FINGERS RT HAND  . Arthritis   . Anxiety   . Asthma     Past Surgical History  Procedure Laterality Date  . Umbilical hernia repair    . Breast cyst excision    . Knee arthroscopy w/ meniscal repair      left  . Carpell tunnell surg    . 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 Tuberculosis Father   . Tuberculosis Paternal Uncle     Social History History  Substance Use Topics  . Smoking status: Never Smoker   . Smokeless tobacco: Not on file  . Alcohol Use: No    Current Outpatient Prescriptions  Medication Sig Dispense Refill  . albuterol (PROVENTIL HFA;VENTOLIN HFA) 108 (90 BASE) MCG/ACT inhaler Inhale 1 puff into the lungs every 6 (six) hours as needed for wheezing or shortness of breath.    Marland Kitchen aspirin 325 MG tablet Take 325 mg by mouth as needed.    . furosemide (LASIX) 20 MG tablet Take 20 mg by mouth as needed for fluid.     Marland Kitchen ibuprofen (ADVIL,MOTRIN) 200 MG tablet Take 200 mg by mouth every 6 (six) hours as needed.    . Loratadine (CLARITIN PO) Take 10 mg by mouth as needed.      No current facility-administered medications for this visit.    Allergies  Allergen Reactions  . Adhesive [Tape]     Rash  . Sulfonamide Derivatives     Unknown    Review of Systems  Constitutional: Negative.  Negative for fever, chills, activity change and unexpected weight change.  HENT:       Dental  implant  Respiratory: Positive for cough (productive), shortness of breath (> 1 flight of stairs) and wheezing.   Cardiovascular: Positive for leg swelling. Negative for chest pain.  Endocrine: Negative.   Musculoskeletal: Positive for joint swelling and arthralgias.       History of TKR  Allergic/Immunologic: Positive for environmental allergies.  Neurological: Positive for numbness (in hands).  Hematological: Negative for adenopathy. Does not bruise/bleed easily.  All other systems reviewed and are negative.   BP 178/85 mmHg  Pulse 92  Resp 18  Ht _0  (1.702 m)  Wt 233 lb (105.688 kg)  BMI 36.48 kg/m2  SpO2 97% Physical Exam  Constitutional: She is oriented to person, place, and time. She appears well-developed and  well-nourished. No distress.  obese  HENT:  Head: Normocephalic and atraumatic.  Eyes: EOM are normal. Pupils are equal, round, and reactive to light.  Neck: Neck supple. No thyromegaly present.  Cardiovascular: Normal rate, regular rhythm and normal heart sounds.  Exam reveals no gallop and no friction rub.   No murmur heard. Pulmonary/Chest: Effort normal. She has no wheezes.  Coarse BS bilaterally  Abdominal: Soft. There is no tenderness.  Musculoskeletal: Normal range of motion. She exhibits no edema.  Lymphadenopathy:    She has no cervical adenopathy.  Neurological: She is alert and oriented to person, place, and time. No cranial nerve deficit.  Skin: Skin is warm and dry.  Vitals reviewed.    Diagnostic Tests: CT CHEST WITHOUT CONTRAST  TECHNIQUE: Multidetector CT imaging of the chest was performed following the standard protocol without intravenous contrast. High resolution imaging of the lungs, as well as inspiratory and expiratory imaging, was performed.  COMPARISON: Chest CT 03/21/2007.  FINDINGS: Mediastinum/Lymph Nodes: Heart size is normal. There is no significant pericardial fluid, thickening or pericardial calcification. Calcifications of the mitral annulus and aortic valve. Multiple borderline enlarged mediastinal and bilateral hilar lymph nodes are noted, but nonspecific. Small hiatal hernia. No axillary lymphadenopathy.  Lungs/Pleura: High-resolution images demonstrate patchy areas of ground-glass attenuation and reticulation throughout the lungs bilaterally which are peribronchovascular and subpleural in location. There is associated thickening of the peribronchovascular interstitium and a architectural distortion in the areas of greatest involvement, where there is also a cylindrical and varicose bronchiectasis. No definite macrocystic honeycombing, although there does appear to be a small amount of microcystic honeycombing. While there is no  clear cut craniocaudal gradient, findings are most severe in the anterior aspects of the upper lobes of the lungs bilaterally. Overall, the severity of disease is only slightly increased compared to the remote prior examination from 2008. Inspiratory and expiratory imaging demonstrates extensive air trapping, indicative of small airways disease. No confluent consolidative airspace disease. No pleural effusions. No definite suspicious appearing pulmonary nodules or masses.  Musculoskeletal/Soft Tissues: There are no aggressive appearing lytic or blastic lesions noted in the visualized portions of the skeleton.  Upper Abdomen: 4 mm calcified gallstone in the neck of the gallbladder.  IMPRESSION: 1. 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 2. Cholelithiasis. 3. Small hiatal hernia.   Electronically Signed  By: Vinnie Langton M.D.  On: 06/24/2014 16:04   Impression: 74 year old woman with interstitial lung disease. She has had progression of her radiologic findings, and pulmonary function testing shows worsening restrictive lung disease and progressive impairment  of diffusion capacity. Dr. Gwenette Greet feels that she needs a lung biopsy to establish a definitive diagnosis and guide therapy.  I had a long discussion [name] and her son and daughter. I personally reviewed the CT images and concur with the radiologist's findings. I reviewed the images with the patient and her children. We discussed the purpose of lung biopsy. They understand that this is strictly diagnostic and not therapeutic in any way. They understand that there is no guarantee that a definitive diagnosis will be given.  I have discussed the general nature of  the procedure, the need for general anesthesia, and the incisions to be used with [name] and her children. I discussed the expected hospital stay, overall recovery and short and long term outcomes. I reviewed the indications, risks, benefits, and alternatives. They understand the risks include, but are not limited to death, stroke, MI, DVT/PE, bleeding, possible need for transfusion, infections, prolonged air leak, cardiac arrhythmias, and other organ system dysfunction including renal or GI complications.   She accepts the risks and agrees to proceed.  Plan:  Right video-assisted thoracoscopy for lung biopsy on Thursday, June 9  Melrose Nakayama, MD [facility] [phone]

## 2014-10-21 ENCOUNTER — Other Ambulatory Visit: Payer: Self-pay | Admitting: *Deleted

## 2014-10-21 DIAGNOSIS — J849 Interstitial pulmonary disease, unspecified: Secondary | ICD-10-CM

## 2014-11-11 ENCOUNTER — Other Ambulatory Visit (HOSPITAL_COMMUNITY): Payer: Self-pay

## 2014-11-18 ENCOUNTER — Encounter (HOSPITAL_COMMUNITY)
Admission: RE | Admit: 2014-11-18 | Discharge: 2014-11-18 | Disposition: A | Discharge status: Home / Self Care | Payer: Medicare Other | Source: Ambulatory Visit | Attending: Thoracic Surgery (Cardiothoracic Vascular Surgery) | Admitting: Thoracic Surgery (Cardiothoracic Vascular Surgery)

## 2014-11-18 ENCOUNTER — Other Ambulatory Visit: Payer: Self-pay

## 2014-11-18 ENCOUNTER — Encounter (HOSPITAL_COMMUNITY): Payer: Self-pay

## 2014-11-18 ENCOUNTER — Ambulatory Visit (HOSPITAL_COMMUNITY)
Admission: RE | Admit: 2014-11-18 | Discharge: 2014-11-18 | Disposition: A | Discharge status: Home / Self Care | Payer: Medicare Other | Source: Ambulatory Visit | Attending: Thoracic Surgery (Cardiothoracic Vascular Surgery) | Admitting: Thoracic Surgery (Cardiothoracic Vascular Surgery)

## 2014-11-18 VITALS — BP 139/58 | HR 82 | Temp 98.4°F | Resp 20 | Ht 66.0 in | Wt 233.0 lb

## 2014-11-18 DIAGNOSIS — Z79899 Other long term (current) drug therapy: Secondary | ICD-10-CM | POA: Diagnosis not present

## 2014-11-18 DIAGNOSIS — Z96652 Presence of left artificial knee joint: Secondary | ICD-10-CM | POA: Diagnosis not present

## 2014-11-18 DIAGNOSIS — R918 Other nonspecific abnormal finding of lung field: Secondary | ICD-10-CM | POA: Diagnosis not present

## 2014-11-18 DIAGNOSIS — J849 Interstitial pulmonary disease, unspecified: Secondary | ICD-10-CM

## 2014-11-18 DIAGNOSIS — Z01818 Encounter for other preprocedural examination: Secondary | ICD-10-CM | POA: Insufficient documentation

## 2014-11-18 DIAGNOSIS — Z7982 Long term (current) use of aspirin: Secondary | ICD-10-CM | POA: Diagnosis not present

## 2014-11-18 DIAGNOSIS — J8410 Pulmonary fibrosis, unspecified: Secondary | ICD-10-CM | POA: Diagnosis not present

## 2014-11-18 HISTORY — DX: Edema, unspecified: R60.9

## 2014-11-18 HISTORY — DX: Reserved for inherently not codable concepts without codable children: IMO0001

## 2014-11-18 HISTORY — DX: Personal history of other diseases of the respiratory system: Z87.09

## 2014-11-18 HISTORY — DX: Personal history of colon polyps, unspecified: Z86.0100

## 2014-11-18 HISTORY — DX: Localized edema: R60.0

## 2014-11-18 HISTORY — DX: Personal history of colonic polyps: Z86.010

## 2014-11-18 LAB — BLOOD GAS, ARTERIAL
Acid-Base Excess: 2.3 mmol/L — ABNORMAL HIGH (ref 0.0–2.0)
Bicarbonate: 26.3 mEq/L — ABNORMAL HIGH (ref 20.0–24.0)
Drawn by: 206361
FIO2: 0.21 %
O2 Saturation: 96.8 %
Patient temperature: 98.6
TCO2: 27.5 mmol/L (ref 0–100)
pCO2 arterial: 40.7 mmHg (ref 35.0–45.0)
pH, Arterial: 7.426 (ref 7.350–7.450)
pO2, Arterial: 77.7 mmHg — ABNORMAL LOW (ref 80.0–100.0)

## 2014-11-18 LAB — URINALYSIS, ROUTINE W REFLEX MICROSCOPIC
Bilirubin Urine: NEGATIVE
Glucose, UA: NEGATIVE mg/dL
Hgb urine dipstick: NEGATIVE
Ketones, ur: NEGATIVE mg/dL
Nitrite: NEGATIVE
Protein, ur: NEGATIVE mg/dL
Specific Gravity, Urine: 1.008 (ref 1.005–1.030)
Urobilinogen, UA: 0.2 mg/dL (ref 0.0–1.0)
pH: 6.5 (ref 5.0–8.0)

## 2014-11-18 LAB — COMPREHENSIVE METABOLIC PANEL
ALT: 16 U/L (ref 14–54)
AST: 18 U/L (ref 15–41)
Albumin: 3.5 g/dL (ref 3.5–5.0)
Alkaline Phosphatase: 77 U/L (ref 38–126)
Anion gap: 9 (ref 5–15)
BUN: 8 mg/dL (ref 6–20)
CO2: 26 mmol/L (ref 22–32)
Calcium: 9 mg/dL (ref 8.9–10.3)
Chloride: 104 mmol/L (ref 101–111)
Creatinine, Ser: 0.7 mg/dL (ref 0.44–1.00)
GFR calc Af Amer: >60 mL/min (ref >60)
GFR calc non Af Amer: >60 mL/min (ref >60)
Glucose, Bld: 103 mg/dL — ABNORMAL HIGH (ref 65–99)
Potassium: 3.9 mmol/L (ref 3.5–5.1)
Sodium: 139 mmol/L (ref 135–145)
Total Bilirubin: 0.7 mg/dL (ref 0.3–1.2)
Total Protein: 6.9 g/dL (ref 6.5–8.1)

## 2014-11-18 LAB — TYPE AND SCREEN
ABO/RH(D): A POS
Antibody Screen: NEGATIVE

## 2014-11-18 LAB — ABO/RH: ABO/RH(D): A POS

## 2014-11-18 LAB — URINE MICROSCOPIC-ADD ON

## 2014-11-18 LAB — APTT: aPTT: 36 seconds (ref 24–37)

## 2014-11-18 LAB — CBC
HCT: 42.2 % (ref 36.0–46.0)
Hemoglobin: 13.8 g/dL (ref 12.0–15.0)
MCH: 28.8 pg (ref 26.0–34.0)
MCHC: 32.7 g/dL (ref 30.0–36.0)
MCV: 87.9 fL (ref 78.0–100.0)
Platelets: 283 10*3/uL (ref 150–400)
RBC: 4.8 MIL/uL (ref 3.87–5.11)
RDW: 14.8 % (ref 11.5–15.5)
WBC: 11.9 10*3/uL — ABNORMAL HIGH (ref 4.0–10.5)

## 2014-11-18 LAB — SURGICAL PCR SCREEN
MRSA, PCR: NEGATIVE
Staphylococcus aureus: NEGATIVE

## 2014-11-18 LAB — PROTIME-INR
INR: 1.05 (ref 0.00–1.49)
Prothrombin Time: 13.9 seconds (ref 11.6–15.2)

## 2014-11-18 NOTE — Pre-Procedure Instructions (Signed)
Tamerra G Carver  11/18/2014      [id number] [place], [place] - [address] [place] [id number]: [phone]: [phone]   Your procedure is scheduled on Thurs, June 16 @ 8:00 AM  [facility]  Call this number if you have problems the morning of surgery:  [phone]   Remember:  Do not eat food or drink liquids after midnight.  Take these medicines the morning of surgery with A SIP OF WATER:Albuterol<Bring Your Inhaler With You> and Zyrtec(Cetirizine)               Stop taking your Aspirin and Ibuprofen. No Goody's,BC's,Aleve,Fish Oil,or any Herbal Medications.    Do not wear jewelry, make-up or nail polish.  Do not wear lotions, powders, or perfumes.   Do not shave 48 hours prior to surgery.    Do not bring valuables to the hospital.  [facility] or [facility].  Contacts, dentures or bridgework may not be worn into surgery.  Leave your suitcase in the car.  After surgery it may be brought to your room.  For patients admitted to the hospital, discharge time will be determined by your treatment team.  Patients discharged the day of surgery will not be allowed to drive home.    Special instructions:  [facility] - Preparing for Surgery  Before surgery, you can play an important role.  Because skin is not sterile, your skin needs to be as free of germs as possible.  You can reduce the number of germs on you skin by washing with CHG (chlorahexidine gluconate) soap before surgery.  CHG is an antiseptic cleaner which kills germs and bonds with the skin to continue killing germs even after washing.  Please DO NOT use if you have an allergy to CHG or antibacterial soaps.  If your skin becomes reddened/irritated stop using the CHG and inform your nurse when you arrive at Short Stay.  Do not shave (including legs  and underarms) for at least 48 hours prior to the first CHG shower.  You may shave your face.  Please follow these instructions carefully:   1.  Shower with CHG Soap the night before surgery and the                                morning of Surgery.  2.  If you choose to wash your hair, wash your hair first as usual with your       normal shampoo.  3.  After you shampoo, rinse your hair and body thoroughly to remove the                      Shampoo.  4.  Use CHG as you would any other liquid soap.  You can apply chg directly       to the skin and wash gently with scrungie or a clean washcloth.  5.  Apply the CHG Soap to your body ONLY FROM THE NECK DOWN.        Do not use on open wounds or open sores.  Avoid contact with your eyes,       ears, mouth and genitals (private parts).  Wash genitals (private parts)       with your normal soap.  6.  Wash thoroughly, paying special attention to the area where your surgery        will be performed.  7.  Thoroughly rinse your body with warm water from the neck down.  8.  DO NOT shower/wash with your normal soap after using and rinsing off       the CHG Soap.  9.  Pat yourself dry with a clean towel.            10.  Wear clean pajamas.            11.  Place clean sheets on your bed the night of your first shower and do not        sleep with pets.  Day of Surgery  Do not apply any lotions/deoderants the morning of surgery.  Please wear clean clothes to the hospital/surgery center.    Please read over the following fact sheets that you were given. Pain Booklet, Coughing and Deep Breathing, Blood Transfusion Information, MRSA Information and Surgical Site Infection Prevention

## 2014-11-18 NOTE — Progress Notes (Signed)
Saw a cardiologist in 2012 with visit in epic  Denies ever having an echo/stress test/heart cath  Medical Md is Dr.C Gus Height with [facility]   Denies EKG in past month

## 2014-11-19 MED ORDER — DEXTROSE 5 % IV SOLN
1.5000 g | INTRAVENOUS | Status: AC
  Administered 2014-11-20: 1.5 g via INTRAVENOUS
  Filled 2014-11-19: qty 1.5

## 2014-11-20 ENCOUNTER — Encounter (HOSPITAL_COMMUNITY)
Admission: RE | Disposition: A | Discharge status: Home / Self Care | Payer: Self-pay | Source: Ambulatory Visit | Attending: Thoracic Surgery (Cardiothoracic Vascular Surgery)

## 2014-11-20 ENCOUNTER — Inpatient Hospital Stay (HOSPITAL_COMMUNITY): Payer: Medicare Other | Admitting: Certified Registered"

## 2014-11-20 ENCOUNTER — Encounter (HOSPITAL_COMMUNITY): Payer: Self-pay | Admitting: Surgery

## 2014-11-20 ENCOUNTER — Inpatient Hospital Stay (HOSPITAL_COMMUNITY): Payer: Medicare Other

## 2014-11-20 ENCOUNTER — Inpatient Hospital Stay (HOSPITAL_COMMUNITY)
Admission: RE | Admit: 2014-11-20 | Discharge: 2014-11-23 | DRG: 168 | Disposition: A | Discharge status: Home / Self Care | Payer: Medicare Other | Source: Ambulatory Visit | Attending: Thoracic Surgery (Cardiothoracic Vascular Surgery) | Admitting: Thoracic Surgery (Cardiothoracic Vascular Surgery)

## 2014-11-20 DIAGNOSIS — R918 Other nonspecific abnormal finding of lung field: Secondary | ICD-10-CM | POA: Diagnosis not present

## 2014-11-20 DIAGNOSIS — Z96652 Presence of left artificial knee joint: Secondary | ICD-10-CM | POA: Diagnosis not present

## 2014-11-20 DIAGNOSIS — Z79899 Other long term (current) drug therapy: Secondary | ICD-10-CM

## 2014-11-20 DIAGNOSIS — J849 Interstitial pulmonary disease, unspecified: Secondary | ICD-10-CM | POA: Diagnosis not present

## 2014-11-20 DIAGNOSIS — Z01818 Encounter for other preprocedural examination: Secondary | ICD-10-CM

## 2014-11-20 DIAGNOSIS — J8410 Pulmonary fibrosis, unspecified: Secondary | ICD-10-CM | POA: Diagnosis not present

## 2014-11-20 DIAGNOSIS — M1990 Unspecified osteoarthritis, unspecified site: Secondary | ICD-10-CM | POA: Diagnosis not present

## 2014-11-20 DIAGNOSIS — Z01812 Encounter for preprocedural laboratory examination: Secondary | ICD-10-CM

## 2014-11-20 DIAGNOSIS — R079 Chest pain, unspecified: Secondary | ICD-10-CM | POA: Diagnosis not present

## 2014-11-20 DIAGNOSIS — Z01810 Encounter for preprocedural cardiovascular examination: Secondary | ICD-10-CM

## 2014-11-20 DIAGNOSIS — J939 Pneumothorax, unspecified: Secondary | ICD-10-CM | POA: Diagnosis not present

## 2014-11-20 DIAGNOSIS — Z4682 Encounter for fitting and adjustment of non-vascular catheter: Secondary | ICD-10-CM

## 2014-11-20 DIAGNOSIS — J9811 Atelectasis: Secondary | ICD-10-CM | POA: Diagnosis not present

## 2014-11-20 DIAGNOSIS — T859XXA Unspecified complication of internal prosthetic device, implant and graft, initial encounter: Secondary | ICD-10-CM

## 2014-11-20 DIAGNOSIS — Z7982 Long term (current) use of aspirin: Secondary | ICD-10-CM | POA: Diagnosis not present

## 2014-11-20 DIAGNOSIS — K219 Gastro-esophageal reflux disease without esophagitis: Secondary | ICD-10-CM | POA: Diagnosis not present

## 2014-11-20 DIAGNOSIS — IMO0001 Reserved for inherently not codable concepts without codable children: Secondary | ICD-10-CM | POA: Diagnosis present

## 2014-11-20 DIAGNOSIS — J84112 Idiopathic pulmonary fibrosis: Secondary | ICD-10-CM | POA: Diagnosis not present

## 2014-11-20 DIAGNOSIS — R0602 Shortness of breath: Secondary | ICD-10-CM | POA: Diagnosis not present

## 2014-11-20 HISTORY — PX: VIDEO ASSISTED THORACOSCOPY: SHX5073

## 2014-11-20 HISTORY — PX: LUNG BIOPSY: SHX5088

## 2014-11-20 SURGERY — VIDEO ASSISTED THORACOSCOPY
Anesthesia: General | Site: Chest | Laterality: Right

## 2014-11-20 MED ORDER — SENNOSIDES-DOCUSATE SODIUM 8.6-50 MG PO TABS
1.0000 | ORAL_TABLET | Freq: Every day | ORAL | Status: DC
  Administered 2014-11-20 – 2014-11-22 (×3): 1 via ORAL
  Filled 2014-11-20 (×4): qty 1

## 2014-11-20 MED ORDER — BISACODYL 5 MG PO TBEC
10.0000 mg | DELAYED_RELEASE_TABLET | Freq: Every day | ORAL | Status: DC
  Administered 2014-11-20 – 2014-11-23 (×4): 10 mg via ORAL
  Filled 2014-11-20 (×4): qty 2

## 2014-11-20 MED ORDER — NALOXONE HCL 0.4 MG/ML IJ SOLN: 0.4000 mg | INTRAMUSCULAR | Status: DC | PRN

## 2014-11-20 MED ORDER — EPHEDRINE SULFATE 50 MG/ML IJ SOLN
INTRAMUSCULAR | Status: AC
  Filled 2014-11-20: qty 1

## 2014-11-20 MED ORDER — MIDAZOLAM HCL 5 MG/5ML IJ SOLN
INTRAMUSCULAR | Status: DC | PRN
  Administered 2014-11-20 (×2): 1 mg via INTRAVENOUS

## 2014-11-20 MED ORDER — ALPRAZOLAM 0.25 MG PO TABS
0.2500 mg | ORAL_TABLET | Freq: Three times a day (TID) | ORAL | Status: DC | PRN
  Administered 2014-11-20 – 2014-11-22 (×4): 0.25 mg via ORAL
  Filled 2014-11-20 (×4): qty 1

## 2014-11-20 MED ORDER — MIDAZOLAM HCL 2 MG/2ML IJ SOLN
INTRAMUSCULAR | Status: AC
Start: 2014-11-20 — End: 2014-11-20
  Filled 2014-11-20: qty 2

## 2014-11-20 MED ORDER — LORATADINE 10 MG PO TABS
10.0000 mg | ORAL_TABLET | Freq: Every day | ORAL | Status: DC
  Administered 2014-11-21 – 2014-11-23 (×3): 10 mg via ORAL
  Filled 2014-11-20 (×3): qty 1

## 2014-11-20 MED ORDER — PROPOFOL 10 MG/ML IV BOLUS
INTRAVENOUS | Status: AC
  Filled 2014-11-20: qty 20

## 2014-11-20 MED ORDER — ROCURONIUM BROMIDE 100 MG/10ML IV SOLN
INTRAVENOUS | Status: DC | PRN
  Administered 2014-11-20: 40 mg via INTRAVENOUS
  Administered 2014-11-20 (×2): 10 mg via INTRAVENOUS

## 2014-11-20 MED ORDER — ACETAMINOPHEN 160 MG/5ML PO SOLN
1000.0000 mg | Freq: Four times a day (QID) | ORAL | Status: DC
  Filled 2014-11-20 (×17): qty 40

## 2014-11-20 MED ORDER — OXYCODONE HCL 5 MG PO TABS: 5.0000 mg | ORAL_TABLET | Freq: Once | ORAL | Status: DC | PRN

## 2014-11-20 MED ORDER — ALUM & MAG HYDROXIDE-SIMETH 200-200-20 MG/5ML PO SUSP
30.0000 mL | Freq: Four times a day (QID) | ORAL | Status: DC | PRN
  Administered 2014-11-20: 30 mL via ORAL
  Filled 2014-11-20: qty 30

## 2014-11-20 MED ORDER — PROMETHAZINE HCL 25 MG/ML IJ SOLN: 6.2500 mg | INTRAMUSCULAR | Status: DC | PRN

## 2014-11-20 MED ORDER — FENTANYL 10 MCG/ML IV SOLN
INTRAVENOUS | Status: DC
  Administered 2014-11-20: 400 ug via INTRAVENOUS
  Administered 2014-11-20: 78.04 ug via INTRAVENOUS
  Administered 2014-11-20: 10:00:00 via INTRAVENOUS
  Administered 2014-11-21 (×2): 0 ug via INTRAVENOUS
  Filled 2014-11-20 (×4): qty 50

## 2014-11-20 MED ORDER — OXYCODONE HCL 5 MG PO TABS
ORAL_TABLET | ORAL | Status: AC
Start: 2014-11-20 — End: 2014-11-20
  Filled 2014-11-20: qty 2

## 2014-11-20 MED ORDER — PROPOFOL 10 MG/ML IV BOLUS
INTRAVENOUS | Status: DC | PRN
  Administered 2014-11-20: 130 mg via INTRAVENOUS
  Administered 2014-11-20: 50 mg via INTRAVENOUS

## 2014-11-20 MED ORDER — BUPIVACAINE 0.5 % ON-Q PUMP SINGLE CATH 400 ML
400.0000 mL | INJECTION | Status: DC
  Filled 2014-11-20: qty 400

## 2014-11-20 MED ORDER — PANTOPRAZOLE SODIUM 40 MG PO TBEC
40.0000 mg | DELAYED_RELEASE_TABLET | Freq: Every day | ORAL | Status: DC
Start: 2014-11-20 — End: 2014-11-23
  Administered 2014-11-20 – 2014-11-22 (×3): 40 mg via ORAL
  Filled 2014-11-20 (×2): qty 1

## 2014-11-20 MED ORDER — IBUPROFEN 200 MG PO TABS: 400.0000 mg | ORAL_TABLET | Freq: Four times a day (QID) | ORAL | Status: DC | PRN

## 2014-11-20 MED ORDER — LIDOCAINE HCL (CARDIAC) 20 MG/ML IV SOLN
INTRAVENOUS | Status: DC | PRN
  Administered 2014-11-20: 100 mg via INTRAVENOUS

## 2014-11-20 MED ORDER — ARTIFICIAL TEARS OP OINT
TOPICAL_OINTMENT | OPHTHALMIC | Status: DC | PRN
  Administered 2014-11-20: 1 via OPHTHALMIC

## 2014-11-20 MED ORDER — DIPHENHYDRAMINE HCL 12.5 MG/5ML PO ELIX
12.5000 mg | ORAL_SOLUTION | Freq: Four times a day (QID) | ORAL | Status: DC | PRN
  Filled 2014-11-20: qty 5

## 2014-11-20 MED ORDER — ONDANSETRON HCL 4 MG/2ML IJ SOLN: 4.0000 mg | Freq: Four times a day (QID) | INTRAMUSCULAR | Status: DC | PRN

## 2014-11-20 MED ORDER — ONDANSETRON HCL 4 MG/2ML IJ SOLN
INTRAMUSCULAR | Status: AC
  Filled 2014-11-20: qty 2

## 2014-11-20 MED ORDER — OXYCODONE HCL 5 MG/5ML PO SOLN: 5.0000 mg | Freq: Once | ORAL | Status: DC | PRN

## 2014-11-20 MED ORDER — HYDROMORPHONE HCL 1 MG/ML IJ SOLN
INTRAMUSCULAR | Status: AC
  Filled 2014-11-20: qty 2

## 2014-11-20 MED ORDER — GLYCOPYRROLATE 0.2 MG/ML IJ SOLN
INTRAMUSCULAR | Status: DC | PRN
  Administered 2014-11-20: .8 mg via INTRAVENOUS

## 2014-11-20 MED ORDER — FENTANYL CITRATE (PF) 250 MCG/5ML IJ SOLN
INTRAMUSCULAR | Status: AC
  Filled 2014-11-20: qty 5

## 2014-11-20 MED ORDER — DEXTROSE 5 % IV SOLN
1.5000 g | Freq: Two times a day (BID) | INTRAVENOUS | Status: AC
  Administered 2014-11-20 – 2014-11-21 (×2): 1.5 g via INTRAVENOUS
  Filled 2014-11-20 (×2): qty 1.5

## 2014-11-20 MED ORDER — ARTIFICIAL TEARS OP OINT
TOPICAL_OINTMENT | OPHTHALMIC | Status: AC
  Filled 2014-11-20: qty 3.5

## 2014-11-20 MED ORDER — ALBUTEROL SULFATE (2.5 MG/3ML) 0.083% IN NEBU: 3.0000 mL | INHALATION_SOLUTION | Freq: Four times a day (QID) | RESPIRATORY_TRACT | Status: DC | PRN

## 2014-11-20 MED ORDER — ACETAMINOPHEN 500 MG PO TABS
1000.0000 mg | ORAL_TABLET | Freq: Four times a day (QID) | ORAL | Status: DC
  Administered 2014-11-20 – 2014-11-23 (×11): 1000 mg via ORAL
  Filled 2014-11-20 (×15): qty 2

## 2014-11-20 MED ORDER — DIPHENHYDRAMINE HCL 50 MG/ML IJ SOLN: 12.5000 mg | Freq: Four times a day (QID) | INTRAMUSCULAR | Status: DC | PRN

## 2014-11-20 MED ORDER — HYDROMORPHONE HCL 1 MG/ML IJ SOLN
0.2500 mg | INTRAMUSCULAR | Status: DC | PRN
  Administered 2014-11-20: 1 mg via INTRAVENOUS
  Administered 2014-11-20 (×2): 0.5 mg via INTRAVENOUS

## 2014-11-20 MED ORDER — FENTANYL CITRATE (PF) 100 MCG/2ML IJ SOLN
INTRAMUSCULAR | Status: DC | PRN
  Administered 2014-11-20 (×3): 50 ug via INTRAVENOUS

## 2014-11-20 MED ORDER — TRAMADOL HCL 50 MG PO TABS
50.0000 mg | ORAL_TABLET | Freq: Four times a day (QID) | ORAL | Status: DC | PRN
  Administered 2014-11-20 – 2014-11-21 (×2): 100 mg via ORAL
  Filled 2014-11-20 (×2): qty 2

## 2014-11-20 MED ORDER — LACTATED RINGERS IV SOLN
INTRAVENOUS | Status: DC | PRN
  Administered 2014-11-20 (×2): via INTRAVENOUS

## 2014-11-20 MED ORDER — DEXTROSE-NACL 5-0.9 % IV SOLN
INTRAVENOUS | Status: DC
  Administered 2014-11-21: 02:00:00 via INTRAVENOUS

## 2014-11-20 MED ORDER — POTASSIUM CHLORIDE 10 MEQ/50ML IV SOLN: 10.0000 meq | Freq: Every day | INTRAVENOUS | Status: DC | PRN

## 2014-11-20 MED ORDER — NEOSTIGMINE METHYLSULFATE 10 MG/10ML IV SOLN
INTRAVENOUS | Status: DC | PRN
  Administered 2014-11-20: 5 mg via INTRAVENOUS

## 2014-11-20 MED ORDER — SODIUM CHLORIDE 0.9 % IJ SOLN: 9.0000 mL | INTRAMUSCULAR | Status: DC | PRN

## 2014-11-20 MED ORDER — ONDANSETRON HCL 4 MG/2ML IJ SOLN
INTRAMUSCULAR | Status: DC | PRN
  Administered 2014-11-20: 4 mg via INTRAVENOUS

## 2014-11-20 MED ORDER — LIDOCAINE HCL (CARDIAC) 20 MG/ML IV SOLN
INTRAVENOUS | Status: AC
  Filled 2014-11-20: qty 5

## 2014-11-20 MED ORDER — SUCCINYLCHOLINE CHLORIDE 20 MG/ML IJ SOLN
INTRAMUSCULAR | Status: AC
  Filled 2014-11-20: qty 1

## 2014-11-20 MED ORDER — 0.9 % SODIUM CHLORIDE (POUR BTL) OPTIME
TOPICAL | Status: DC | PRN
  Administered 2014-11-20: 1000 mL

## 2014-11-20 MED ORDER — SODIUM CHLORIDE 0.9 % IJ SOLN
INTRAMUSCULAR | Status: AC
  Filled 2014-11-20: qty 10

## 2014-11-20 MED ORDER — ROCURONIUM BROMIDE 50 MG/5ML IV SOLN
INTRAVENOUS | Status: AC
  Filled 2014-11-20: qty 1

## 2014-11-20 MED ORDER — OXYCODONE HCL 5 MG PO TABS
5.0000 mg | ORAL_TABLET | ORAL | Status: DC | PRN
  Administered 2014-11-20: 10 mg via ORAL

## 2014-11-20 SURGICAL SUPPLY — 83 items
ADH SKN CLS APL DERMABOND .7 (GAUZE/BANDAGES/DRESSINGS)
ADH SKN CLS LQ APL DERMABOND (GAUZE/BANDAGES/DRESSINGS) ×2
APPLIER CLIP ROT 10 11.4 M/L (STAPLE)
APR CLP MED LRG 11.4X10 (STAPLE)
BAG SPEC RTRVL LRG 6X4 10 (ENDOMECHANICALS)
CANISTER SUCTION 2500CC (MISCELLANEOUS) ×3 IMPLANT
CATH KIT ON Q 5IN SLV (PAIN MANAGEMENT) ×1 IMPLANT
CATH THORACIC 28FR (CATHETERS) IMPLANT
CATH THORACIC 28FR RT ANG (CATHETERS) IMPLANT
CATH THORACIC 36FR (CATHETERS) IMPLANT
CATH THORACIC 36FR RT ANG (CATHETERS) IMPLANT
CLIP APPLIE ROT 10 11.4 M/L (STAPLE) IMPLANT
CLIP TI MEDIUM 6 (CLIP) IMPLANT
CONN Y 3/8X3/8X3/8  BEN (MISCELLANEOUS) ×1
CONN Y 3/8X3/8X3/8 BEN (MISCELLANEOUS) ×2 IMPLANT
CONT SPEC 4OZ CLIKSEAL STRL BL (MISCELLANEOUS) ×6 IMPLANT
COVER SURGICAL LIGHT HANDLE (MISCELLANEOUS) ×3 IMPLANT
CUTTER ECHEON FLEX ENDO 45 340 (ENDOMECHANICALS) ×1 IMPLANT
DERMABOND ADHESIVE PROPEN (GAUZE/BANDAGES/DRESSINGS) ×1
DERMABOND ADVANCED (GAUZE/BANDAGES/DRESSINGS)
DERMABOND ADVANCED .7 DNX12 (GAUZE/BANDAGES/DRESSINGS) IMPLANT
DERMABOND ADVANCED .7 DNX6 (GAUZE/BANDAGES/DRESSINGS) IMPLANT
DRAIN CHANNEL 28F RND 3/8 FF (WOUND CARE) IMPLANT
DRAIN CHANNEL 32F RND 10.7 FF (WOUND CARE) IMPLANT
DRAPE LAPAROSCOPIC ABDOMINAL (DRAPES) ×3 IMPLANT
DRAPE WARM FLUID 44X44 (DRAPE) ×3 IMPLANT
ELECT REM PT RETURN 9FT ADLT (ELECTROSURGICAL) ×3
ELECTRODE REM PT RTRN 9FT ADLT (ELECTROSURGICAL) ×2 IMPLANT
GAUZE SPONGE 4X4 12PLY STRL (GAUZE/BANDAGES/DRESSINGS) ×3 IMPLANT
GLOVE BIOGEL PI IND STRL 6.5 (GLOVE) IMPLANT
GLOVE BIOGEL PI INDICATOR 6.5 (GLOVE) ×1
GLOVE ECLIPSE 6.5 STRL STRAW (GLOVE) ×1 IMPLANT
GLOVE SURG SIGNA 7.5 PF LTX (GLOVE) ×6 IMPLANT
GOWN STRL REUS W/ TWL LRG LVL3 (GOWN DISPOSABLE) ×4 IMPLANT
GOWN STRL REUS W/ TWL XL LVL3 (GOWN DISPOSABLE) ×2 IMPLANT
GOWN STRL REUS W/TWL LRG LVL3 (GOWN DISPOSABLE) ×6
GOWN STRL REUS W/TWL XL LVL3 (GOWN DISPOSABLE) ×3
HEMOSTAT SURGICEL 2X14 (HEMOSTASIS) IMPLANT
KIT BASIN OR (CUSTOM PROCEDURE TRAY) ×3 IMPLANT
KIT ROOM TURNOVER OR (KITS) ×3 IMPLANT
KIT SUCTION CATH 14FR (SUCTIONS) ×3 IMPLANT
NS IRRIG 1000ML POUR BTL (IV SOLUTION) ×6 IMPLANT
PACK CHEST (CUSTOM PROCEDURE TRAY) ×3 IMPLANT
PAD ARMBOARD 7.5X6 YLW CONV (MISCELLANEOUS) ×6 IMPLANT
POUCH ENDO CATCH II 15MM (MISCELLANEOUS) IMPLANT
POUCH SPECIMEN RETRIEVAL 10MM (ENDOMECHANICALS) IMPLANT
RELOAD GOLD ECHELON 45 (STAPLE) ×8 IMPLANT
SEALANT PROGEL (MISCELLANEOUS) IMPLANT
SEALANT SURG COSEAL 4ML (VASCULAR PRODUCTS) IMPLANT
SEALANT SURG COSEAL 8ML (VASCULAR PRODUCTS) IMPLANT
SOLUTION ANTI FOG 6CC (MISCELLANEOUS) ×3 IMPLANT
SPECIMEN JAR MEDIUM (MISCELLANEOUS) ×3 IMPLANT
SPONGE GAUZE 4X4 12PLY STER LF (GAUZE/BANDAGES/DRESSINGS) ×1 IMPLANT
SPONGE INTESTINAL PEANUT (DISPOSABLE) IMPLANT
SUT PROLENE 4 0 RB 1 (SUTURE)
SUT PROLENE 4-0 RB1 .5 CRCL 36 (SUTURE) IMPLANT
SUT SILK  1 MH (SUTURE) ×2
SUT SILK 1 MH (SUTURE) ×4 IMPLANT
SUT SILK 1 TIES 10X30 (SUTURE) ×1 IMPLANT
SUT SILK 2 0 SH (SUTURE) ×1 IMPLANT
SUT SILK 2 0SH CR/8 30 (SUTURE) IMPLANT
SUT SILK 3 0 SH 30 (SUTURE) ×1 IMPLANT
SUT SILK 3 0SH CR/8 30 (SUTURE) IMPLANT
SUT VIC AB 1 CTX 36 (SUTURE) ×3
SUT VIC AB 1 CTX36XBRD ANBCTR (SUTURE) IMPLANT
SUT VIC AB 2-0 CTX 36 (SUTURE) ×1 IMPLANT
SUT VIC AB 2-0 UR6 27 (SUTURE) IMPLANT
SUT VIC AB 3-0 MH 27 (SUTURE) IMPLANT
SUT VIC AB 3-0 X1 27 (SUTURE) ×3 IMPLANT
SUT VICRYL 2 TP 1 (SUTURE) IMPLANT
SWAB COLLECTION DEVICE MRSA (MISCELLANEOUS) IMPLANT
SYSTEM SAHARA CHEST DRAIN ATS (WOUND CARE) ×3 IMPLANT
TAPE CLOTH 4X10 WHT NS (GAUZE/BANDAGES/DRESSINGS) ×3 IMPLANT
TAPE CLOTH SURG 4X10 WHT LF (GAUZE/BANDAGES/DRESSINGS) ×1 IMPLANT
TIP APPLICATOR SPRAY EXTEND 16 (VASCULAR PRODUCTS) IMPLANT
TOWEL OR 17X24 6PK STRL BLUE (TOWEL DISPOSABLE) ×3 IMPLANT
TOWEL OR 17X26 10 PK STRL BLUE (TOWEL DISPOSABLE) ×6 IMPLANT
TRAP SPECIMEN MUCOUS 40CC (MISCELLANEOUS) IMPLANT
TRAY FOLEY CATH 16FRSI W/METER (SET/KITS/TRAYS/PACK) ×3 IMPLANT
TROCAR XCEL NON-BLD 5MMX100MML (ENDOMECHANICALS) IMPLANT
TUBE ANAEROBIC SPECIMEN COL (MISCELLANEOUS) IMPLANT
TUNNELER SHEATH ON-Q 11GX8 DSP (PAIN MANAGEMENT) IMPLANT
WATER STERILE IRR 1000ML POUR (IV SOLUTION) ×6 IMPLANT

## 2014-11-20 NOTE — Progress Notes (Signed)
[address]       [place] [address]             [phone]      No shortness of breath  BP 143/60 mmHg  Pulse 88  Temp(Src) 98 F (36.7 C) (Oral)  Resp 21  Ht 5' 7" (1.702 m)  Wt 230 lb 6.1 oz (104.5 kg)  BMI 36.07 kg/m2  SpO2 98%  Follow up CXR shows decreased pneumo, increased subcutaneous air  Increase in subcutaneous air likely due to side hole of CT being in subcutaneous position. Will repeat a CXR in 2 hours just to be safe  Remo Lipps C. Roxan Hockey, MD [facility] [phone]

## 2014-11-20 NOTE — Transfer of Care (Signed)
Immediate Anesthesia Transfer of Care Note  Patient: [name]  Procedure(s) Performed: Procedure(s): RIGHT  VIDEO ASSISTED THORACOSCOPY WITH WEDGE LUNG BIOPSIES RIGHT UPPER,MIDDLE AND LOWER LOBES ,PLACEMENT OF ON Q PAIN PUMP (Right) LUNG BIOPSY (Right)  Patient Location: PACU  Anesthesia Type:General  Level of Consciousness: lethargic and responds to stimulation  Airway & Oxygen Therapy: Patient Spontanous Breathing and Patient connected to nasal cannula oxygen  Post-op Assessment: Report given to RN  Post vital signs: Reviewed and stable  Last Vitals:  Filed Vitals:   11/20/14 0638  BP: 170/57  Pulse: 88  Temp: 36.7 C  Resp: 20    Complications: No apparent anesthesia complications

## 2014-11-20 NOTE — Anesthesia Postprocedure Evaluation (Signed)
  Anesthesia Post-op Note  Patient: [name]  Procedure(s) Performed: Procedure(s): RIGHT  VIDEO ASSISTED THORACOSCOPY WITH WEDGE LUNG BIOPSIES RIGHT UPPER,MIDDLE AND LOWER LOBES ,PLACEMENT OF ON Q PAIN PUMP (Right) LUNG BIOPSY (Right)  Patient Location: PACU  Anesthesia Type:General  Level of Consciousness: awake and alert   Airway and Oxygen Therapy: Patient Spontanous Breathing  Post-op Pain: mild  Post-op Assessment: Post-op Vital signs reviewed              Post-op Vital Signs: Reviewed  Last Vitals:  Filed Vitals:   11/20/14 1135  BP: 135/51  Pulse: 62  Temp:   Resp: 15    Complications: No apparent anesthesia complications

## 2014-11-20 NOTE — H&P (Signed)
[address]       [place] [address]             [phone]        Expand All Collapse All   PCP is  Melinda Crutch, MD Referring Provider is Clance, Armando Reichert, MD  Chief Complaint  Patient presents with  . Shortness of Breath    ILD...eval for lung bx...PFT'S 07/04/14    HPI: 74 year old woman is with a chief complaint of shortness of breath and wheezing.  [name] is a 74 year old woman who has a history of interstitial lung disease dating back to 2008. She is followed by Dr. Danton Sewer. She had a high resolution CT back in 2008 that suggested fibrosing NSIP. Pulmonary function testing showed restrictive disease and impairment of her diffusion capacity.  She recently has been having more shortness of breath with exertion. She says she can easily get up one flight of stairs, but her children noted her being short of breath after climbing 2 flights recently. She has a frequent productive cough. Sputum is usually yellow or clear. She has been having more frequent wheezing recently. She does not have any shortness of breath at rest.  She recently saw Dr. Gwenette Greet in follow-up. Pulmonary function testing showed progression of her restrictive disease and worsening diffusion capacity. A repeat high-resolution CT showed progressive changes again suggestive of fibrosing NSIP.  He recommended that she have a lung biopsy. She is reluctant to undergo a surgical procedure due to issues around the time of her knee replacement.   Past Medical History  Diagnosis Date  . GERD (gastroesophageal reflux disease)   . Chest pain   . Edema     legs  . Allergic rhinitis   . Joint pain   . Thyroid nodule   . Restrictive lung disease   . Numbness     FINGERS RT HAND  . Arthritis   . Anxiety   . Asthma     Past Surgical History  Procedure Laterality Date  . Umbilical hernia repair    . Breast cyst  excision    . Knee arthroscopy w/ meniscal repair      left  . Carpell tunnell surg    . Total knee arthroplasty Left 08/13/2013    Procedure: LEFT TOTAL KNEE ARTHROPLASTY; Surgeon: Mauri Pole, MD; Location: [facility]; Service: Orthopedics; Laterality: Left;    Family History  Problem Relation Age of Onset  . Tuberculosis Father   . Tuberculosis Paternal Uncle     Social History History  Substance Use Topics  . Smoking status: Never Smoker   . Smokeless tobacco: Not on file  . Alcohol Use: No    Current Outpatient Prescriptions  Medication Sig Dispense Refill  . albuterol (PROVENTIL HFA;VENTOLIN HFA) 108 (90 BASE) MCG/ACT inhaler Inhale 1 puff into the lungs every 6 (six) hours as needed for wheezing or shortness of breath.    Marland Kitchen aspirin 325 MG tablet Take 325 mg by mouth as needed.    . furosemide (LASIX) 20 MG tablet Take 20 mg by mouth as needed for fluid.     Marland Kitchen ibuprofen (ADVIL,MOTRIN) 200 MG tablet Take 200 mg by mouth every 6 (six) hours as needed.    . Loratadine (CLARITIN PO) Take 10 mg by mouth as needed.      No current facility-administered medications for this visit.    Allergies  Allergen Reactions  . Adhesive [Tape]  Rash  . Sulfonamide Derivatives     Unknown    Review of Systems  Constitutional: Negative. Negative for fever, chills, activity change and unexpected weight change.  HENT:   Dental implant  Respiratory: Positive for cough (productive), shortness of breath (> 1 flight of stairs) and wheezing.  Cardiovascular: Positive for leg swelling. Negative for chest pain.  Endocrine: Negative.  Musculoskeletal: Positive for joint swelling and arthralgias.   History of TKR  Allergic/Immunologic: Positive for environmental allergies.  Neurological: Positive for numbness (in hands).  Hematological: Negative for adenopathy. Does not  bruise/bleed easily.  All other systems reviewed and are negative.   BP 178/85 mmHg  Pulse 92  Resp 18  Ht _0  (1.702 m)  Wt 233 lb (105.688 kg)  BMI 36.48 kg/m2  SpO2 97% Physical Exam  Constitutional: She is oriented to person, place, and time. She appears well-developed and well-nourished. No distress.  obese  HENT:  Head: Normocephalic and atraumatic.  Eyes: EOM are normal. Pupils are equal, round, and reactive to light.  Neck: Neck supple. No thyromegaly present.  Cardiovascular: Normal rate, regular rhythm and normal heart sounds. Exam reveals no gallop and no friction rub.  No murmur heard. Pulmonary/Chest: Effort normal. She has no wheezes.  Coarse BS bilaterally  Abdominal: Soft. There is no tenderness.  Musculoskeletal: Normal range of motion. She exhibits no edema.  Lymphadenopathy:   She has no cervical adenopathy.  Neurological: She is alert and oriented to person, place, and time. No cranial nerve deficit.  Skin: Skin is warm and dry.  Vitals reviewed.    Diagnostic Tests: CT CHEST WITHOUT CONTRAST  TECHNIQUE: Multidetector CT imaging of the chest was performed following the standard protocol without intravenous contrast. High resolution imaging of the lungs, as well as inspiratory and expiratory imaging, was performed.  COMPARISON: Chest CT 03/21/2007.  FINDINGS: Mediastinum/Lymph Nodes: Heart size is normal. There is no significant pericardial fluid, thickening or pericardial calcification. Calcifications of the mitral annulus and aortic valve. Multiple borderline enlarged mediastinal and bilateral hilar lymph nodes are noted, but nonspecific. Small hiatal hernia. No axillary lymphadenopathy.  Lungs/Pleura: High-resolution images demonstrate patchy areas of ground-glass attenuation and reticulation throughout the lungs bilaterally which are peribronchovascular and subpleural in location. There is associated thickening of the  peribronchovascular interstitium and a architectural distortion in the areas of greatest involvement, where there is also a cylindrical and varicose bronchiectasis. No definite macrocystic honeycombing, although there does appear to be a small amount of microcystic honeycombing. While there is no clear cut craniocaudal gradient, findings are most severe in the anterior aspects of the upper lobes of the lungs bilaterally. Overall, the severity of disease is only slightly increased compared to the remote prior examination from 2008. Inspiratory and expiratory imaging demonstrates extensive air trapping, indicative of small airways disease. No confluent consolidative airspace disease. No pleural effusions. No definite suspicious appearing pulmonary nodules or masses.  Musculoskeletal/Soft Tissues: There are no aggressive appearing lytic or blastic lesions noted in the visualized portions of the skeleton.  Upper Abdomen: 4 mm calcified gallstone in the neck of the gallbladder.  IMPRESSION: 1. 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 2. Cholelithiasis. 3. Small hiatal hernia.   Electronically Signed  By: Vinnie Langton M.D.  On: 06/24/2014 16:04   Impression: 74 year old  woman with interstitial lung disease. She has had progression of her radiologic findings, and pulmonary function testing shows worsening restrictive lung disease and progressive impairment of diffusion capacity. Dr. Gwenette Greet feels that she needs a lung biopsy to establish a definitive diagnosis and guide therapy.  I had a long discussion [name] and her son and daughter. I personally reviewed the CT images and concur with the radiologist's findings. I  reviewed the images with the patient and her children. We discussed the purpose of lung biopsy. They understand that this is strictly diagnostic and not therapeutic in any way. They understand that there is no guarantee that a definitive diagnosis will be given.  I have discussed the general nature of the procedure, the need for general anesthesia, and the incisions to be used with [name] and her children. I discussed the expected hospital stay, overall recovery and short and long term outcomes. I reviewed the indications, risks, benefits, and alternatives. They understand the risks include, but are not limited to death, stroke, MI, DVT/PE, bleeding, possible need for transfusion, infections, prolonged air leak, cardiac arrhythmias, and other organ system dysfunction including renal or GI complications.   She accepts the risks and agrees to proceed.  Plan:  Right video-assisted thoracoscopy for lung biopsy on Thursday, June 9  Melrose Nakayama, MD [facility] [phone]      No interval change  Revonda Standard. Roxan Hockey, MD [facility] [phone]

## 2014-11-20 NOTE — Interval H&P Note (Signed)
History and Physical Interval Note:  11/20/2014 7:53 AM  [name]  has presented today for surgery, with the diagnosis of  Interstitial Lung Disease  The various methods of treatment have been discussed with the patient and family. After consideration of risks, benefits and other options for treatment, the patient has consented to  Procedure(s): VIDEO ASSISTED THORACOSCOPY (Right) LUNG BIOPSY (Right) as a surgical intervention .  The patient's history has been reviewed, patient examined, no change in status, stable for surgery.  I have reviewed the patient's chart and labs.  Questions were answered to the patient's satisfaction.     Melrose Nakayama

## 2014-11-20 NOTE — Progress Notes (Signed)
Called to pt room to reposition pt, pt is c/o pain at chest tube site. Assessment of tube showed suture had broken and tube appeared to be partially removed from chest. New vaseline gauze placed and site redressed. Pt vital sign taken were within normal limits. No SOB or chest tightness endorsed by patient. Dr. Roxan Hockey called and per MD chest tube was removed. New order received to obtain STAT chest x-ray. Radiology called. Will continue to monitor.

## 2014-11-20 NOTE — Progress Notes (Signed)
CTSP for CT pulling out  She c/o CP on the right side. RN noted suture broken and tube had pulled partially out chest  Tube is at 2 cm at skin level skin. Air sucking around tube through skin incision. Tube is too far out to safely readvance  She did not have an air leak earlier- Will dc CT and check CXR  Remo Lipps C. Roxan Hockey, MD [facility] [phone]

## 2014-11-20 NOTE — Anesthesia Preprocedure Evaluation (Addendum)
Anesthesia Evaluation  Patient identified by MRN, date of birth, ID band Patient awake    Reviewed: Allergy & Precautions, NPO status , Patient's Chart, lab work & pertinent test results  Airway Mallampati: II  TM Distance: >3 FB Neck ROM: Full    Dental  (+) Teeth Intact, Dental Advisory Given   Pulmonary shortness of breath, asthma ,  Interstitial lung disease. Restrictive lung disease breath sounds clear to auscultation        Cardiovascular hypertension, Pt. on medications Rhythm:Regular Rate:Normal     Neuro/Psych negative neurological ROS     GI/Hepatic Neg liver ROS, GERD-  ,  Endo/Other  Morbid obesity  Renal/GU negative Renal ROS     Musculoskeletal  (+) Arthritis -,   Abdominal   Peds  Hematology negative hematology ROS (+)   Anesthesia Other Findings   Reproductive/Obstetrics                           Anesthesia Physical Anesthesia Plan  ASA: III  Anesthesia Plan: General   Post-op Pain Management:    Induction: Intravenous  Airway Management Planned: Double Lumen EBT  Additional Equipment: Arterial line  Intra-op Plan:   Post-operative Plan: Extubation in OR  Informed Consent: I have reviewed the patients History and Physical, chart, labs and discussed the procedure including the risks, benefits and alternatives for the proposed anesthesia with the patient or authorized representative who has indicated his/her understanding and acceptance.   Dental advisory given  Plan Discussed with: CRNA  Anesthesia Plan Comments:         Anesthesia Quick Evaluation

## 2014-11-20 NOTE — Anesthesia Procedure Notes (Signed)
Procedure Name: Intubation Date/Time: 11/20/2014 8:22 AM Performed by: Sampson Si E Pre-anesthesia Checklist: Patient identified, Emergency Drugs available, Suction available, Patient being monitored and Timeout performed Patient Re-evaluated:Patient Re-evaluated prior to inductionOxygen Delivery Method: Circle system utilized Preoxygenation: Pre-oxygenation with 100% oxygen Intubation Type: IV induction Ventilation: Mask ventilation without difficulty Laryngoscope Size: Mac and 3 Grade View: Grade I Tube type: Oral Endobronchial tube: Left and Double lumen EBT and 37 Fr Number of attempts: 2 Airway Equipment and Method: Stylet and Fiberoptic brochoscope Placement Confirmation: ETT inserted through vocal cords under direct vision,  positive ETCO2 and breath sounds checked- equal and bilateral Secured at: 29 cm Tube secured with: Tape Dental Injury: Teeth and Oropharynx as per pre-operative assessment

## 2014-11-20 NOTE — Brief Op Note (Addendum)
11/20/2014      [address]       [place] [address]             [phone]     11/20/2014  9:41 AM  PATIENT:  [name]  74 y.o. female  PRE-OPERATIVE DIAGNOSIS:   Interstitial Lung Disease  POST-OPERATIVE DIAGNOSIS:   Interstitial Lung Disease  PROCEDURE:  Procedure(s): RIGHT  VIDEO ASSISTED THORACOSCOPY LUNG BIOPSIES of RIGHT UPPER, MIDDLE AND LOWER LOBES PLACEMENT OF ON-Q LOCAL ANESTHETIC CATHETER   SURGEON:  Surgeon(s): Melrose Nakayama, MD  PHYSICIAN ASSISTANT: WAYNE GOLD PA-C  ANESTHESIA:   general  SPECIMEN:  Source of Specimen:  RIGHT LUNG  DISPOSITION OF SPECIMEN:  Pathology  DRAINS: 1 Chest Tube(s) in the RIGHT HEMITHORAX   PATIENT CONDITION:  PACU - hemodynamically stable.  PRE-OPERATIVE WEIGHT: 171NO  COMPLICATIONS: NO KNOWN  EBL: MINIMAL

## 2014-11-21 ENCOUNTER — Inpatient Hospital Stay (HOSPITAL_COMMUNITY): Payer: Medicare Other

## 2014-11-21 ENCOUNTER — Encounter (HOSPITAL_COMMUNITY): Payer: Self-pay | Admitting: Thoracic Surgery (Cardiothoracic Vascular Surgery)

## 2014-11-21 LAB — BLOOD GAS, ARTERIAL
Acid-Base Excess: 1.9 mmol/L (ref 0.0–2.0)
Bicarbonate: 26.8 mEq/L — ABNORMAL HIGH (ref 20.0–24.0)
Drawn by: 398981
O2 Content: 3 L/min
O2 Saturation: 96.2 %
Patient temperature: 98.6
TCO2: 28.4 mmol/L (ref 0–100)
pCO2 arterial: 49.1 mmHg — ABNORMAL HIGH (ref 35.0–45.0)
pH, Arterial: 7.356 (ref 7.350–7.450)
pO2, Arterial: 80.7 mmHg (ref 80.0–100.0)

## 2014-11-21 LAB — BASIC METABOLIC PANEL
Anion gap: 6 (ref 5–15)
BUN: <5 mg/dL — ABNORMAL LOW (ref 6–20)
CO2: 27 mmol/L (ref 22–32)
Calcium: 8.1 mg/dL — ABNORMAL LOW (ref 8.9–10.3)
Chloride: 104 mmol/L (ref 101–111)
Creatinine, Ser: 0.52 mg/dL (ref 0.44–1.00)
GFR calc Af Amer: >60 mL/min (ref >60)
GFR calc non Af Amer: >60 mL/min (ref >60)
Glucose, Bld: 117 mg/dL — ABNORMAL HIGH (ref 65–99)
Potassium: 3.9 mmol/L (ref 3.5–5.1)
Sodium: 137 mmol/L (ref 135–145)

## 2014-11-21 LAB — CBC
HCT: 39.4 % (ref 36.0–46.0)
Hemoglobin: 12.5 g/dL (ref 12.0–15.0)
MCH: 29.1 pg (ref 26.0–34.0)
MCHC: 31.7 g/dL (ref 30.0–36.0)
MCV: 91.6 fL (ref 78.0–100.0)
Platelets: 270 10*3/uL (ref 150–400)
RBC: 4.3 MIL/uL (ref 3.87–5.11)
RDW: 14.8 % (ref 11.5–15.5)
WBC: 12.2 10*3/uL — ABNORMAL HIGH (ref 4.0–10.5)

## 2014-11-21 MED ORDER — ENOXAPARIN SODIUM 40 MG/0.4ML ~~LOC~~ SOLN
40.0000 mg | SUBCUTANEOUS | Status: DC
  Administered 2014-11-21 – 2014-11-23 (×3): 40 mg via SUBCUTANEOUS
  Filled 2014-11-21 (×4): qty 0.4

## 2014-11-21 MED ORDER — FUROSEMIDE 20 MG PO TABS
20.0000 mg | ORAL_TABLET | Freq: Every day | ORAL | Status: DC
  Administered 2014-11-21 – 2014-11-23 (×3): 20 mg via ORAL
  Filled 2014-11-21 (×3): qty 1

## 2014-11-21 NOTE — Progress Notes (Signed)
Witnessed waste of 51m Fentanyl PCA with WMerlene Laughter RN

## 2014-11-21 NOTE — Care Management Note (Addendum)
Case Management Note  Patient Details  Name: SHIZA THELEN MRN: [id number] Date of Birth: 07-24-1940  Subjective/Objective:                 Pt  from home  s/p VAT's.   Action/Plan: Return to home when medically stable. CM to f/u with d/c needs.  Expected Discharge Date:                  Expected Discharge Plan:  Home/Self Care  In-House Referral:     Discharge planning Services  CM Consult  Post Acute Care Choice:    Choice offered to:     DME Arranged:    DME Agency:     HH Arranged:    HH Agency:     Status of Service:  In process, will continue to follow  Medicare Important Message Given:    Date Medicare IM Given:    Medicare IM give by:    Date Additional Medicare IM Given:    Additional Medicare Important Message give by:     If discussed at [place] of Stay Meetings, dates discussed:    Additional Comments:  Eugene Gavia (daughter) [phone] Sharin Mons, RN 11/21/2014, 11:52 AM

## 2014-11-21 NOTE — Progress Notes (Signed)
RT was called to patient room to assess A-line. Patients A-line would not draw back. RT attempted to trouble shoot but was unsuccessful. No blood flow through A-line. A-line was pulled and ABG was retrieved from opposite arm.

## 2014-11-21 NOTE — Discharge Instructions (Signed)
Video-Assisted Thoracic Surgery Care After Refer to this sheet in the next few weeks. These instructions provide you with information on caring for yourself after your procedure. Your caregiver may also give you more specific instructions. Your procedure has been planned according to current medical practices, but problems sometimes occur. Call your caregiver if you have any problems or questions after your procedure. HOME CARE INSTRUCTIONS   Only take over-the-counter or prescription medications as directed.  Only take pain medications (narcotics) as directed.  Do not drive until your caregiver approves. Driving while taking narcotics or soon after surgery can be dangerous, so discuss the specific timing with your caregiver.  Avoid activities that use your chest muscles, such as lifting heavy objects, for at least 3-4 weeks.   Take deep breaths to expand the lungs and to protect against pneumonia.  Do breathing exercises as directed by your caregiver. If you were given an incentive spirometer to help with breathing, use it as directed.  You may resume a normal diet and activities when you feel you are able to or as directed.  Do not take a bath until your caregiver says it is OK. Use the shower instead.   Keep the bandage (dressing) covering the area where the chest tube was inserted (incision site) dry for 48 hours. After 48 hours, remove the dressing unless there is new drainage.  Remove dressings as directed by your caregiver.  Change dressings if necessary or as directed.  Keep all follow-up appointments. It is important for you to see your caregiver after surgery to discuss appropriate follow-up care and surveillance, if it is necessary. SEEK MEDICAL CARE:  You feel excessive or increasing pain at an incision site.  You notice bleeding, skin irritation, drainage, swelling, or redness at an incision site.  There is a bad smell coming from an incision or dressing.  It feels  like your heart is fluttering or beating rapidly.  Your pain medication does not relieve your pain. SEEK IMMEDIATE MEDICAL CARE IF:   You have a fever.   You have chest pain.  You have a rash.  You have shortness of breath.  You have trouble breathing.   You feel weak, lightheaded, dizzy, or faint.  MAKE SURE YOU:   Understand these instructions.   Will watch your condition.   Will get help right away if you are not doing well or get worse. Document Released: 09/17/2012 Document Reviewed: 09/17/2012 [facility] Patient Information 2015 [place]. This information is not intended to replace advice given to you by your health care provider. Make sure you discuss any questions you have with your health care provider.

## 2014-11-21 NOTE — Discharge Summary (Signed)
Physician Discharge Summary  Patient ID: [name] MRN: [id number] DOB/AGE: 07-Mar-1941 74 y.o.  Admit date: 11/20/2014 Discharge date: 11/23/2014  Admission Diagnoses:  Patient Active Problem List   Diagnosis Date Noted  . 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 Bronchiectasis without acute exacerbation 06/30/2014  . Recurrent acute sinusitis 06/17/2014  . Interstitial lung disease 06/17/2014  . Obese 08/14/2013  . 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 Obesity 12/17/2010  . HTN (hypertension) 12/17/2010  . LOW BACK PAIN SYNDROME 07/09/2007  . DYSPNEA 07/09/2007  . HYPERLIPIDEMIA 04/03/2007  . GERD 04/03/2007   Discharge Diagnoses:   Patient Active Problem List   Diagnosis Date Noted  . 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 Bronchiectasis without acute exacerbation 06/30/2014  . Recurrent acute sinusitis 06/17/2014  . Interstitial lung disease 06/17/2014  . Obese 08/14/2013  . 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 Obesity 12/17/2010  . HTN (hypertension) 12/17/2010  . LOW BACK PAIN SYNDROME 07/09/2007  . DYSPNEA 07/09/2007  . HYPERLIPIDEMIA 04/03/2007  . GERD 04/03/2007   Discharged Condition: good  History of Present Illness:  [name] is a 74 year old woman who has a history of interstitial lung disease dating back to 2008. She is followed by [name]. She had a high resolution CT back in 2008 that suggested fibrosing NSIP. Pulmonary function testing showed restrictive disease and impairment of her diffusion capacity.  She recently has been having more shortness of breath with exertion. She says she can easily get up one flight of stairs, but her children noted her being short of breath after climbing 2 flights recently. She has a frequent productive cough. Sputum is usually yellow or clear. She has been having more frequent wheezing recently. She does not have any shortness of breath at rest.   She recently saw [name] in follow-up. Pulmonary function testing showed  progression of her restrictive disease and worsening diffusion capacity. A repeat high-resolution CT showed progressive changes again suggestive of fibrosing NSIP.  It was felt she should have a lung biopsy performed and she was referred to [facility] for surgical evaluation.  She was evaluated by [name] who after reviewing her imaging studies was in agreement to perform a lung biopsy.  The procedure was explained to the patient and her children.  They understood this was being done for diagnostic purposes, but would not provide relief.  She was agreeable to proceed.  Hospital Course:   [name] presented to [facility] on [facility]20/2014.  She was taken to the operating room and underwent Right VATS with lung biopsy from Upper Middle and Lower lobes of the right lung.  She tolerated the procedure, was extubated and taken to the PACU in stable condition.  The patients arterial line was removed after being non-functional.  She pulled her chest tube out overnight.  Follow up CXR this morning, showed a tiny residual pneumothorax and atelectasis. Follow up CXR showed no pneumothorax.She has continued to progress.  She is ambulating without much difficulty.  She did desat with ambulation on 06/18;however, the morning of 06/19 her oxygen saturation with ambulation was 90-91%. . She is tolerating a regular diet.  She is felt medically stable for discharge on 11/23/2014.    Significant Diagnostic Studies:  CLINICAL DATA: Pneumothorax  EXAM: CHEST 2 VIEW  COMPARISON: 11/22/2014  FINDINGS: Bilateral fibrotic lung disease is stable. Mild cardiomegaly. No pneumothorax. No pleural effusion. Osteopenia without compression deformity. Emphysema over the right chest wall is stable.  IMPRESSION: Chronic lung disease. No evidence  of pneumothorax.   Electronically Signed  By: [name] M.D.  On: 11/23/2014 09:16  Treatments: surgery:   Right video-assisted thoracoscopy; lung biopsy from  upper, middle, and lower lobes; ON-Q local anesthetic catheter placement by [name] on 11/21/2014.  Pathology: Final results are pending.  Disposition: 06-Home-Health Care Svc   Discharge Medications:    Medication List    TAKE these medications        albuterol 108 (90 BASE) MCG/ACT inhaler  Commonly known as:  PROVENTIL HFA;VENTOLIN HFA  Inhale 2 puffs into the lungs every 6 (six) hours as needed for wheezing or shortness of breath.     aspirin 325 MG tablet  Take 650 mg by mouth every 6 (six) hours as needed for mild pain or moderate pain.     cetirizine 10 MG tablet  Commonly known as:  ZYRTEC  Take 10 mg by mouth at bedtime.     furosemide 20 MG tablet  Commonly known as:  LASIX  Take 20 mg by mouth as needed for fluid.     ibuprofen 200 MG tablet  Commonly known as:  ADVIL,MOTRIN  Take 400 mg by mouth every 6 (six) hours as needed for mild pain or moderate pain.     traMADol 50 MG tablet  Commonly known as:  ULTRAM  Take 1 tablet (50 mg total) by mouth every 6 (six) hours as needed (mild pain).       Follow-up Information    Follow up with Melrose Nakayama, MD On 12/09/2014.   Specialty:  Cardiothoracic Surgery   Why:  Appointment is at 11:30   Contact information:   [address] [place] [address] [phone]       Follow up with [facility] On 12/09/2014.   Why:  Please get CXR at 10:45   Contact information:   [facility]       Signed: Nani Skillern 11/23/2014, 10:07 AM

## 2014-11-21 NOTE — Progress Notes (Signed)
Pt foley removed at 1030 per protocol, pt tolerated procedure well. Pt has spontaneously voided since removal. Will continue to monitor.

## 2014-11-21 NOTE — Progress Notes (Signed)
1 Day Post-Op Procedure(s) (LRB): RIGHT  VIDEO ASSISTED THORACOSCOPY WITH WEDGE LUNG BIOPSIES RIGHT UPPER,MIDDLE AND LOWER LOBES ,PLACEMENT OF ON Q PAIN PUMP (Right) LUNG BIOPSY (Right) Subjective: Didn't sleep well Not using PCA Denies nausea  Objective: Vital signs in last 24 hours: Temp:  [97.5 F (36.4 C)-98.3 F (36.8 C)] 98 F (36.7 C) (06/17 0430) Pulse Rate:  [62-88] 88 (06/16 1500) Cardiac Rhythm:  [-] Normal sinus rhythm (06/17 0521) Resp:  [11-23] 23 (06/17 0521) BP: (123-161)/(41-99) 139/47 mmHg (06/17 0521) SpO2:  [92 %-98 %] 98 % (06/17 0521) Arterial Line BP: (73-194)/(55-127) 73/58 mmHg (06/17 0430) Weight:  [230 lb 6.1 oz (104.5 kg)] 230 lb 6.1 oz (104.5 kg) (06/16 1248)  Hemodynamic parameters for last 24 hours:    Intake/Output from previous day: 06/16 0701 - 06/17 0700 In: 2677.5 [P.O.:720; I.V.:1907.5; IV Piggyback:50] Out: 860 [Urine:750; Chest Tube:110] Intake/Output this shift:    General appearance: alert and no distress Neurologic: intact Heart: regular rate and rhythm Lungs: diminished breath sounds bibasilar Abdomen: normal findings: soft, non-tender  Lab Results:  Recent Labs  11/18/14 1257 11/21/14 0528  WBC 11.9* 12.2*  HGB 13.8 12.5  HCT 42.2 39.4  PLT 283 270   BMET:  Recent Labs  11/18/14 1257 11/21/14 0528  NA 139 137  K 3.9 3.9  CL 104 104  CO2 26 27  GLUCOSE 103* 117*  BUN 8 <5*  CREATININE 0.70 0.52  CALCIUM 9.0 8.1*    PT/INR:  Recent Labs  11/18/14 1257  LABPROT 13.9  INR 1.05   ABG    Component Value Date/Time   PHART 7.356 11/21/2014 0517   HCO3 26.8* 11/21/2014 0517   TCO2 28.4 11/21/2014 0517   O2SAT 96.2 11/21/2014 0517   CBG (last 3)  No results for input(s): GLUCAP in the last 72 hours.  Assessment/Plan: S/P Procedure(s) (LRB): RIGHT  VIDEO ASSISTED THORACOSCOPY WITH WEDGE LUNG BIOPSIES RIGHT UPPER,MIDDLE AND LOWER LOBES ,PLACEMENT OF ON Q PAIN PUMP (Right) LUNG BIOPSY (Right) POD # 1    Looks good this AM CV- stable  RESP_ ILD- s/p biopsy, path pending  Chest tube pulled out last night- has tiny pneumo that is stable  Some atelectasis on CXR- add flutter  RENAL- lytes, creatinine OK  ENDO- CBG Ok  PAIN CONTROL- not using PCA- dc   SCD + enoxaparin for DVT prophylaxis   LOS: 1 day    [name] 11/21/2014

## 2014-11-21 NOTE — Op Note (Signed)
NAMEKIESHA, ENSEY NO.:  [id number]  MEDICAL RECORD NO.:  [id number]  LOCATION:  3S03C                        FACILITY:  [facility]  PHYSICIAN:  Revonda Standard. Roxan Hockey, M.D.DATE OF BIRTH:  Aug 04, 1940  DATE OF PROCEDURE:  11/20/2014 DATE OF DISCHARGE:                              OPERATIVE REPORT   PREOPERATIVE DIAGNOSIS:  Interstitial lung disease.  POSTOPERATIVE DIAGNOSIS:  Interstitial lung disease.  PROCEDURE:  Right video-assisted thoracoscopy; lung biopsy from upper, middle, and lower lobes; ON-Q local anesthetic catheter placement.  SURGEON:  Revonda Standard. Roxan Hockey, M.D.  ASSISTANT:  John Giovanni, P.A.-C.  ANESTHESIA:  General.  FINDINGS:  Frozen section of right lower lobe biopsy revealed interstitial lung disease.  Final diagnosis pending.  CLINICAL NOTE:  Ms. Fodor is a 74 year old woman with chronic dyspnea who has interstitial lung disease.  She was referred for lung biopsy to guide therapy.  The patient was advised to undergo lung biopsy.  She understood this was a diagnostic and not a therapeutic procedure.  The indications, risks, benefits, and alternatives were discussed in detail with the patient.  She understood and accepted the risks and agreed to proceed.  OPERATIVE NOTE:  Ms. Mcginty was brought to the preoperative holding area on November 20, 2014. Anesthesia placed an arterial blood pressure monitoring line and a central line.  She was taken to the operating room, anesthetized, and intubated.  Intravenous antibiotics were administered.  A Foley catheter was placed.  Sequential compressive devices were placed on the calves for DVT prophylaxis.  She was placed in a left lateral decubitus position, and the right chest was prepped and draped in the usual sterile fashion.  Single lung ventilation of the left lung was initiated and was tolerated well throughout the procedure.  An incision made in the seventh intercostal space in the  anterior axillary line.  A 5-mm port was inserted and the thoracoscope was advanced into the chest.  There was good isolation of the right lung. The right lung had a relatively pale and had a slightly nodular appearance. There was no pleural effusion and no mass lesions were seen.  A small utility incision was made in the fifth interspace in the anterior axillary line.  No rib spreading was performed during the procedure. The inferior margin of the lower lobe was grasped, the lung appeared abnormal in this area.  A biopsy was performed with sequential firings of an endoscopic stapler.  The specimen was removed from the chest and sent for frozen section.  While awaiting those results, biopsies were taken from the inferior aspect of the right middle lobe and the inferior aspect of the right upper lobe.  Again, all biopsies were performed with sequential firings of the endoscopic stapler.  A small portion of the specimen from the middle lobe and upper lobe was sent for AFB and fungal cultures.  The remainder was sent for permanent pathology.  The staple lines were inspected.  There was good hemostasis at all the lines.  An ON-Q local anesthetic catheter was tunneled into a subpleural location and primed with 5 mL of 0.5% Marcaine.  It was secured at the skin with a 3-0 silk suture.  A 28-French chest tube  was placed through the original port incision and directed apically.  The thoracoscope was placed through the incision, and the right lung was reinflated.  There was good expansion of all 3 lobes and good position of the chest tube. The thoracoscope was removed.  The incision was closed in standard fashion.  Frozen section revealed interstitial lung disease.  Final diagnosis, will await permanent pathology.  The patient was placed back in a supine position.  The chest tube was placed to suction.  She was extubated in the operating room and taken to the postanesthetic care unit in  good condition.  All sponge, needle, and instrument counts were at the end of the procedure.     Revonda Standard Roxan Hockey, M.D.     SCH/MEDQ  D:  11/20/2014  T:  11/21/2014  Job:  [id number]

## 2014-11-22 ENCOUNTER — Inpatient Hospital Stay (HOSPITAL_COMMUNITY): Payer: Medicare Other

## 2014-11-22 LAB — COMPREHENSIVE METABOLIC PANEL
ALT: 15 U/L (ref 14–54)
AST: 17 U/L (ref 15–41)
Albumin: 2.5 g/dL — ABNORMAL LOW (ref 3.5–5.0)
Alkaline Phosphatase: 59 U/L (ref 38–126)
Anion gap: 6 (ref 5–15)
BUN: 6 mg/dL (ref 6–20)
CO2: 29 mmol/L (ref 22–32)
Calcium: 7.9 mg/dL — ABNORMAL LOW (ref 8.9–10.3)
Chloride: 102 mmol/L (ref 101–111)
Creatinine, Ser: 0.58 mg/dL (ref 0.44–1.00)
GFR calc Af Amer: >60 mL/min (ref >60)
GFR calc non Af Amer: >60 mL/min (ref >60)
Glucose, Bld: 99 mg/dL (ref 65–99)
Potassium: 3.6 mmol/L (ref 3.5–5.1)
Sodium: 137 mmol/L (ref 135–145)
Total Bilirubin: 0.5 mg/dL (ref 0.3–1.2)
Total Protein: 5.5 g/dL — ABNORMAL LOW (ref 6.5–8.1)

## 2014-11-22 LAB — CBC
HCT: 37.1 % (ref 36.0–46.0)
Hemoglobin: 11.8 g/dL — ABNORMAL LOW (ref 12.0–15.0)
MCH: 29 pg (ref 26.0–34.0)
MCHC: 31.8 g/dL (ref 30.0–36.0)
MCV: 91.2 fL (ref 78.0–100.0)
Platelets: 268 10*3/uL (ref 150–400)
RBC: 4.07 MIL/uL (ref 3.87–5.11)
RDW: 14.6 % (ref 11.5–15.5)
WBC: 11.9 10*3/uL — ABNORMAL HIGH (ref 4.0–10.5)

## 2014-11-22 MED ORDER — ALBUTEROL SULFATE (2.5 MG/3ML) 0.083% IN NEBU: 3.0000 mL | INHALATION_SOLUTION | RESPIRATORY_TRACT | Status: DC | PRN

## 2014-11-22 MED ORDER — POTASSIUM CHLORIDE CRYS ER 20 MEQ PO TBCR
40.0000 meq | EXTENDED_RELEASE_TABLET | Freq: Once | ORAL | Status: AC
  Administered 2014-11-22: 40 meq via ORAL
  Filled 2014-11-22: qty 2

## 2014-11-22 MED ORDER — ALBUTEROL SULFATE (2.5 MG/3ML) 0.083% IN NEBU: 3.0000 mL | INHALATION_SOLUTION | RESPIRATORY_TRACT | Status: DC

## 2014-11-22 NOTE — Progress Notes (Addendum)
[address]       [place] [address]             [phone]       2 Days Post-Op Procedure(s) (LRB): RIGHT  VIDEO ASSISTED THORACOSCOPY WITH WEDGE LUNG BIOPSIES RIGHT UPPER,MIDDLE AND LOWER LOBES ,PLACEMENT OF ON Q PAIN PUMP (Right) LUNG BIOPSY (Right)  Subjective: Patient's only complaint is swelling and some pain in left hand  Objective: Vital signs in last 24 hours: Temp:  [98 F (36.7 C)-98.7 F (37.1 C)] 98.3 F (36.8 C) (06/18 0715) Pulse Rate:  [73-81] 81 (06/18 0715) Cardiac Rhythm:  [-] Normal sinus rhythm (06/18 0747) Resp:  [12-19] 19 (06/18 0715) BP: (121-152)/(41-52) 130/52 mmHg (06/18 0715) SpO2:  [90 %-97 %] 90 % (06/18 0715)      Intake/Output from previous day: 06/17 0701 - 06/18 0700 In: 360 [P.O.:360] Out: -    Physical Exam:  Cardiovascular: RRR Pulmonary: Diminished at bases; no rales, wheezes, or rhonchi. Serous drainage from chest tube site. Abdomen: Soft, non tender, bowel sounds present. Extremities: Left hand is swollen. Pulses are intact and hand/fingers warm. Motor/sensory is intact. No cellulitis. Appears to have had a line on this side. Wounds: Clean and dry.  No erythema or signs of infection.   Lab Results: CBC: Recent Labs  11/21/14 0528 11/22/14 0232  WBC 12.2* 11.9*  HGB 12.5 11.8*  HCT 39.4 37.1  PLT 270 268   BMET:  Recent Labs  11/21/14 0528 11/22/14 0232  NA 137 137  K 3.9 3.6  CL 104 102  CO2 27 29  GLUCOSE 117* 99  BUN <5* 6  CREATININE 0.52 0.58  CALCIUM 8.1* 7.9*    PT/INR: No results for input(s): LABPROT, INR in the last 72 hours. ABG:  INR: Will add last result for INR, ABG once components are confirmed Will add last 4 CBG results once components are confirmed  Assessment/Plan:  1. CV - SR in the 80's.  2.  Pulmonary - On room air but nurse reports does desat with ambulation. May need home oxygen. CXR this am appears stable. Will re check CXR in am. Final pathology  pending (likely ILD). Encourage incentive spirometer 3. Supplement potassium 4. Remove On Q 5. Discharge in am  ZIMMERMAN,DONIELLE MPA-C 11/22/2014,9:23 AM  repeat CXR in am patient examined and medical record reviewed,agree with above note. Tharon Aquas Trigt III 11/22/2014

## 2014-11-23 ENCOUNTER — Inpatient Hospital Stay (HOSPITAL_COMMUNITY): Payer: Medicare Other

## 2014-11-23 MED ORDER — TRAMADOL HCL 50 MG PO TABS: 50.0000 mg | ORAL_TABLET | Freq: Four times a day (QID) | ORAL | Status: DC | PRN

## 2014-11-23 NOTE — Progress Notes (Signed)
Patient ambulated around unit on RA. Maintained sats at or above 90% throughout the walk. No c/o SOB, BP, weakness or dizziness reported.

## 2014-11-23 NOTE — Progress Notes (Signed)
Patient discharge instructions reviewed with [name] and [name] who is at bedisde. All questions answered. Son was shown how to change dressings on patients chest tube incision site, son understood all instructions and had no further questions regarding dressing changes. VSS, pt in no acute distress. Patient discharged via wheelchair.

## 2014-11-23 NOTE — Progress Notes (Addendum)
[address]       [place] [address]             [phone]       3 Days Post-Op Procedure(s) (LRB): RIGHT  VIDEO ASSISTED THORACOSCOPY WITH WEDGE LUNG BIOPSIES RIGHT UPPER,MIDDLE AND LOWER LOBES ,PLACEMENT OF ON Q PAIN PUMP (Right) LUNG BIOPSY (Right)  Subjective: Patient with a little soreness at chest tube site.  Objective: Vital signs in last 24 hours: Temp:  [98.2 F (36.8 C)-98.5 F (36.9 C)] 98.5 F (36.9 C) (06/19 0740) Pulse Rate:  [75-86] 76 (06/19 0740) Cardiac Rhythm:  [-] Normal sinus rhythm (06/19 0740) Resp:  [14-19] 19 (06/19 0740) BP: (110-152)/(44-62) 130/49 mmHg (06/19 0740) SpO2:  [94 %-99 %] 95 % (06/19 0740)      Intake/Output from previous day: 06/18 0701 - 06/19 0700 In: 1560 [P.O.:1560] Out: 200 [Urine:200]   Physical Exam:  Cardiovascular: RRR Pulmonary: Slightly diminished at bases; no rales, wheezes, or rhonchi.  Abdomen: Soft, non tender, bowel sounds present. Extremities: Left hand is swollen. Pulses are intact and hand/fingers warm. Motor/sensory is intact. No cellulitis. Appears to have had a line on this side. Wounds: Clean and dry.  No erythema or signs of infection.   Lab Results: CBC:  Recent Labs  11/21/14 0528 11/22/14 0232  WBC 12.2* 11.9*  HGB 12.5 11.8*  HCT 39.4 37.1  PLT 270 268   BMET:   Recent Labs  11/21/14 0528 11/22/14 0232  NA 137 137  K 3.9 3.6  CL 104 102  CO2 27 29  GLUCOSE 117* 99  BUN <5* 6  CREATININE 0.52 0.58  CALCIUM 8.1* 7.9*    PT/INR: No results for input(s): LABPROT, INR in the last 72 hours. ABG:  INR: Will add last result for INR, ABG once components are confirmed Will add last 4 CBG results once components are confirmed  Assessment/Plan:  1. CV - SR in the 80's.  2.  Pulmonary - On room air and oxygen saturation 90-91% with ambulation today. CXR shows no pneumothorax, chronic lung disease. Encourage incentive spirometer 3. Patient does not want  to take "much" for pain. Will give Ultram at discharge 4. Discharge   Witt Plitt MPA-C 11/23/2014,9:53 AM

## 2014-11-24 LAB — TISSUE CULTURE
Culture: NO GROWTH
Culture: NO GROWTH

## 2014-11-26 ENCOUNTER — Encounter (HOSPITAL_COMMUNITY): Payer: Self-pay

## 2014-12-03 ENCOUNTER — Other Ambulatory Visit: Payer: Self-pay | Admitting: Thoracic Surgery (Cardiothoracic Vascular Surgery)

## 2014-12-03 DIAGNOSIS — IMO0001 Reserved for inherently not codable concepts without codable children: Secondary | ICD-10-CM

## 2014-12-09 ENCOUNTER — Ambulatory Visit
Admission: RE | Admit: 2014-12-09 | Discharge: 2014-12-09 | Disposition: A | Discharge status: Home / Self Care | Payer: Medicare Other | Source: Ambulatory Visit | Attending: Thoracic Surgery (Cardiothoracic Vascular Surgery) | Admitting: Thoracic Surgery (Cardiothoracic Vascular Surgery)

## 2014-12-09 ENCOUNTER — Encounter: Payer: Self-pay | Admitting: Thoracic Surgery (Cardiothoracic Vascular Surgery)

## 2014-12-09 ENCOUNTER — Ambulatory Visit (INDEPENDENT_AMBULATORY_CARE_PROVIDER_SITE_OTHER): Payer: Self-pay | Admitting: Thoracic Surgery (Cardiothoracic Vascular Surgery)

## 2014-12-09 VITALS — BP 172/77 | HR 82 | Resp 18 | Ht 67.0 in | Wt 230.0 lb

## 2014-12-09 DIAGNOSIS — IMO0001 Reserved for inherently not codable concepts without codable children: Secondary | ICD-10-CM

## 2014-12-09 DIAGNOSIS — Z9889 Other specified postprocedural states: Secondary | ICD-10-CM

## 2014-12-09 DIAGNOSIS — J90 Pleural effusion, not elsewhere classified: Secondary | ICD-10-CM | POA: Diagnosis not present

## 2014-12-09 DIAGNOSIS — J849 Interstitial pulmonary disease, unspecified: Secondary | ICD-10-CM

## 2014-12-09 NOTE — Progress Notes (Signed)
[address]       [place] [address]             [phone]       HPI:  Mrs. [name] returns today for a scheduled follow-up visit.  She is a 74 year old woman with interstitial lung disease who had a lung biopsy on 11/21/2014. Her postoperative course was uncomplicated..  She has minimal discomfort from her incision. She is taking non-steroidal for her arthritis, but nothing specifically for chest discomfort. She has not seen change in her breathing postoperatively.  Past Medical History  Diagnosis Date  . GERD (gastroesophageal reflux disease)   . Allergic rhinitis     takes Claritin nightly  . Arthritis   . Asthma   . Carpal tunnel syndrome   . Thyroid nodule   . Shortness of breath dyspnea     with exertion;Albuterol inhaler as needed  . History of bronchitis 3-72yr ago  . Joint pain   . Peripheral edema     takes Lasix daily  . History of colon polyps     benign  . Anxiety     but not on any meds      Current Outpatient Prescriptions  Medication Sig Dispense Refill  . albuterol (PROVENTIL HFA;VENTOLIN HFA) 108 (90 BASE) MCG/ACT inhaler Inhale 2 puffs into the lungs every 6 (six) hours as needed for wheezing or shortness of breath.     .Marland Kitchenaspirin 325 MG tablet Take 650 mg by mouth every 6 (six) hours as needed for mild pain or moderate pain.     . cetirizine (ZYRTEC) 10 MG tablet Take 10 mg by mouth at bedtime.    . furosemide (LASIX) 20 MG tablet Take 20 mg by mouth as needed for fluid.     .Marland Kitchenibuprofen (ADVIL,MOTRIN) 200 MG tablet Take 400 mg by mouth every 6 (six) hours as needed for mild pain or moderate pain.     . traMADol (ULTRAM) 50 MG tablet Take 1 tablet (50 mg total) by mouth every 6 (six) hours as needed (mild pain). (Patient not taking: Reported on 12/09/2014) 30 tablet 0   No current facility-administered medications for this visit.    Physical Exam BP 172/77 mmHg  Pulse 82  Resp 18  Ht _0  (1.702 m)  Wt 230 lb  (104.327 kg)  BMI 36.01 kg/m2  [id number] 74year old obese woman in no acute distress Alert and oriented 3 with no focal deficits Lungs with crackles bilaterally, no wheezing Incisions well healed  Diagnostic Tests: Chest x-ray reviewed there is no effusions or infiltrates  Impression: 74year old woman with interstitial lung disease. She had a thoracoscopic lung biopsy on June 17. She did well postoperatively and is recovering without any issues.  Pathology showed usual interstitial pneumonia.  She has a follow-up scheduled with pulmonary (Dr. MLake Bells next week  From a surgical standpoint her activities are unrestricted.  Plan: Follow-up with Dr. MLake Bells  I'll be happy to see her back can be of any further assistance with her care.  SMelrose Nakayama MD [facility] ([phone]

## 2014-12-16 ENCOUNTER — Encounter: Payer: Self-pay | Admitting: Adult Health

## 2014-12-16 ENCOUNTER — Ambulatory Visit (INDEPENDENT_AMBULATORY_CARE_PROVIDER_SITE_OTHER): Payer: Medicare Other | Admitting: Adult Health

## 2014-12-16 VITALS — BP 148/68 | HR 90 | Temp 98.1°F | Ht 66.0 in | Wt 232.0 lb

## 2014-12-16 DIAGNOSIS — R0602 Shortness of breath: Secondary | ICD-10-CM

## 2014-12-16 DIAGNOSIS — J849 Interstitial pulmonary disease, unspecified: Secondary | ICD-10-CM | POA: Diagnosis not present

## 2014-12-16 DIAGNOSIS — J209 Acute bronchitis, unspecified: Secondary | ICD-10-CM

## 2014-12-16 MED ORDER — AMOXICILLIN-POT CLAVULANATE 875-125 MG PO TABS: 1.0000 | ORAL_TABLET | Freq: Two times a day (BID) | ORAL | Status: AC

## 2014-12-16 MED ORDER — PREDNISONE 10 MG PO TABS: ORAL_TABLET | ORAL | Status: DC

## 2014-12-16 NOTE — Patient Instructions (Signed)
Augmentin 821m Twice daily  For 7 days , take with food.  Prednisone taper over next week.  Mucinex Twice daily  As needed  Cough/congestion  Delsym 2 tsp Twice daily  As needed  Cough  Sips of water to soothe throat.  Claritin daily As needed   Refer to pulmonary rehab.  follow up Dr. MLake Bellsin 3-4 weeks and As needed

## 2014-12-16 NOTE — Assessment & Plan Note (Signed)
Augmentin 863m Twice daily  For 7 days , take with food.  Prednisone taper over next week.  Mucinex Twice daily  As needed  Cough/congestion  Delsym 2 tsp Twice daily  As needed  Cough  Sips of water to soothe throat.  Claritin daily As needed     follow up Dr. MLake Bellsin 3-4 weeks and As needed

## 2014-12-16 NOTE — Progress Notes (Signed)
Subjective:    Patient ID: [name], female    DOB: 04-28-1941, 74 y.o.   MRN: [id number]  HPI 74 year old female never smoker with interstitial lung disease  TEST  CT chest 2008:  Mild bronchiectasis, ?NSIP: CXR 05/2014:  Progressive IS changes compared to 2012. 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  HRCT 2016:  Changes c/w fibrotic NSIP?  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 She was seen in the office with increased shortness of breath. Repeat CT chest and pulmonary function test showed progressive interstitial lung disease and worsening restriction and DLCO. Patient was referred to thoracic surgeon. She underwent a right VATS. Pathology was sent to the [facility] with Dr. Eilene Ghazi.>it showed usual interstitial pneumonia (UIP) There were also scattered nonnecrotizing granulomata in the pleural with unknown significance. Patient presents today along with her family to discuss these results. We went over the pathology findings.. Main complaints at been that she get short of breath with activity. And she has a daily cough. Prior discharge. She did not desaturate on room air. He has never been on oxygen. She is a never smoker. Previous autoimmune panel was negative. We discussed pulmonary rehabilitation. Pneumovax and Prevnar 13 are up-to-date. We did discuss potential treatments that can halt progression. She is not keen on medications but will discuss on return with Dr. Lake Bells at next visit.  Would like to start pulm rehab.    Since surgery, patient  is slowly improving. She has not returned back to baseline. Does feel that over the last couple weeks that her cough is worse and has become more productive with thick yellow mucus. She also has quite a bit of sinus drainage. She denies any hemoptysis, fever, chest pain, orthopnea, PND or leg swelling.   Review of Systems Constitutional:   No  weight loss, night sweats,  Fevers, chills,  +fatigue, or  lassitude.  HEENT:   No headaches,  Difficulty swallowing,  Tooth/dental problems, or  Sore throat,                No sneezing, itching, ear ache,  +nasal congestion, post nasal drip,   CV:  No chest pain,  Orthopnea, PND, swelling in lower extremities, anasarca, dizziness, palpitations, syncope.   GI  No heartburn, indigestion, abdominal pain, nausea, vomiting, diarrhea, change in bowel habits, loss of appetite, bloody stools.   Resp:   No chest wall deformity  Skin: no rash or lesions.  GU: no dysuria, change in color of urine, no urgency or frequency.  No flank pain, no hematuria   MS:  No joint pain or swelling.  No decreased range of motion.  No back pain.  Psych:  No change in mood or affect. No depression or anxiety.  No memory loss.         Objective:   Physical Exam  GEN: A/Ox3; pleasant , NAD, elderly , obese   HEENT:  [place]/AT,  EACs-clear, TMs-wnl, NOSE-clear, THROAT-clear, no lesions, no postnasal drip or exudate noted.   NECK:  Supple w/ fair ROM; no JVD; normal carotid impulses  w/o bruits; no thyromegaly or nodules palpated; no lymphadenopathy.  RESP  Bibasilar crackles no accessory muscle use, no dullness to percussion  CARD:  RRR, no m/r/g  , no peripheral edema, pulses intact, no cyanosis or clubbing.  GI:   Soft & nt; nml bowel sounds; no organomegaly or masses detected.  Musco: Warm bil, no deformities or joint swelling noted.   Neuro: alert, no focal deficits noted.    Skin: Warm, no lesions or rashes         Assessment & Plan:

## 2014-12-16 NOTE — Assessment & Plan Note (Signed)
She had a thoracoscopic lung biopsy on November 21, 2014 Pathology showed usual interstitial pneumonia. Autoimmune panel negative Recent CT chest. And PFT showed progressive changes./Decline. Pathology results were reviewed with patient and family members. Pneumovax and Prevnar up-to-date. Patient has no oxygen desaturations with activity-continue to monitor for hypoxemia, on return office visits. Referred to pulmonary rehabilitation. Repeat PFT in January 2017. Patient is return in 3-4 weeks with Dr. Lake Bells . Discussed with patient current treatment options , ie Esbriet , OFEV to help slow progression .  She will discuss on return regarding tx options.  Patient with mild flare +/- acute bronchitis> treat with anabiotic and sterilely taper.  Plan Augmentin 813m Twice daily  For 7 days , take with food.  Prednisone taper over next week.  Mucinex Twice daily  As needed  Cough/congestion  Delsym 2 tsp Twice daily  As needed  Cough  Sips of water to soothe throat.  Claritin daily As needed   Refer to pulmonary rehab.  follow up Dr. MLake Bellsin 3-4 weeks and As needed

## 2014-12-18 LAB — FUNGUS CULTURE W SMEAR
Fungal Smear: NONE SEEN
Fungal Smear: NONE SEEN

## 2014-12-23 NOTE — Progress Notes (Signed)
I agree with this plan of care Chart reviewed, path reviewed, UIP Will plan to see soon and consider antifibrotic vs immunosuppressive therapy

## 2014-12-30 ENCOUNTER — Telehealth: Payer: Self-pay | Admitting: Pulmonary Disease

## 2014-12-30 NOTE — Telephone Encounter (Signed)
Spoke with Molly at [employer], needs BQ to sign a rehab order behind TP.  This is being faxed to our front fax #. Will forward to myself to look out for.

## 2014-12-30 NOTE — Telephone Encounter (Signed)
Hayden Rasmussen, [phone]

## 2014-12-30 NOTE — Telephone Encounter (Signed)
Called molly and LMTCB X1

## 2015-01-02 LAB — AFB CULTURE WITH SMEAR (NOT AT ARMC)
Acid Fast Smear: NONE SEEN
Acid Fast Smear: NONE SEEN

## 2015-01-02 NOTE — Telephone Encounter (Signed)
Order has been received and given to BQ to sign.

## 2015-01-07 NOTE — Telephone Encounter (Signed)
[name] - is this order done? Please advise.

## 2015-01-07 NOTE — Telephone Encounter (Signed)
I have not yet received this order back from BQ yet.  Will hold in my box and document when it's received and faxed back.

## 2015-01-09 NOTE — Telephone Encounter (Signed)
I do not yet have these orders back from BQ.  Will await form.

## 2015-01-09 NOTE — Telephone Encounter (Signed)
Have you received this order back from BQ? Thanks!

## 2015-01-14 NOTE — Telephone Encounter (Signed)
This has been received back from BQ and faxed back to pulm rehab.  Nothing further needed.

## 2015-01-20 ENCOUNTER — Other Ambulatory Visit (INDEPENDENT_AMBULATORY_CARE_PROVIDER_SITE_OTHER): Payer: Medicare Other

## 2015-01-20 ENCOUNTER — Encounter: Payer: Self-pay | Admitting: Pulmonary Disease

## 2015-01-20 ENCOUNTER — Ambulatory Visit (INDEPENDENT_AMBULATORY_CARE_PROVIDER_SITE_OTHER): Payer: Medicare Other | Admitting: Pulmonary Disease

## 2015-01-20 VITALS — BP 160/82 | HR 79 | Ht 66.0 in | Wt 231.0 lb

## 2015-01-20 DIAGNOSIS — J849 Interstitial pulmonary disease, unspecified: Secondary | ICD-10-CM

## 2015-01-20 DIAGNOSIS — J84112 Idiopathic pulmonary fibrosis: Secondary | ICD-10-CM

## 2015-01-20 LAB — HEPATIC FUNCTION PANEL
ALT: 14 U/L (ref 0–35)
AST: 17 U/L (ref 0–37)
Albumin: 3.8 g/dL (ref 3.5–5.2)
Alkaline Phosphatase: 68 U/L (ref 39–117)
Bilirubin, Direct: 0.1 mg/dL (ref 0.0–0.3)
Total Bilirubin: 0.4 mg/dL (ref 0.2–1.2)
Total Protein: 6.9 g/dL (ref 6.0–8.3)

## 2015-01-20 NOTE — Patient Instructions (Signed)
We will apply for financial assistance for Ofev We will arrange pulmonary rehabilitation We will refer you to [facility] for a second opinion about her pulmonary fibrosis We will see you back in 6-8 weeks or sooner if needed

## 2015-01-20 NOTE — Progress Notes (Signed)
Subjective:    Patient ID: [name], female    DOB: [birth year], 74 y.o.   MRN: [id number]  Synopsis: Former patient of Dr. 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  HPI Chief Complaint  Patient presents with  . Follow-up    former [place] pt for ILD, bronchiectasis.  Pt c/o some sob with exertion, upset about her recent pulmonary fibrosis dx.     [name] has been tired since surgery, depressed since her diagnosis of surgery.  She was diagnosed with fibrosis about 8 years ago. She still has some dyspnea from time to time but manages to stay active.  She doesn't have much cough, not producing mucus.  + family history of lung problems> father and grandfather. Some of them had TB.  She has no known connective tissue disease.  Grandmother had a connective tissue disease, maybe. She's not sure.  She (the patient) has never been diganosis with this.  She worked in a company where she performed [place] for ten years from age [age].  She worked with the [employer] material right in front of her face without a mask.   Father had TB and lived in a sanitarium for many years.   Past Medical History  Diagnosis Date  . GERD (gastroesophageal reflux disease)   . Allergic rhinitis     takes Claritin nightly  . Arthritis   . Asthma   . Carpal tunnel syndrome   . Thyroid nodule   . Shortness of breath dyspnea     with exertion;Albuterol inhaler as needed  . History of bronchitis 3-57yr ago  . Joint pain   . Peripheral edema     takes Lasix daily  . History of colon polyps     benign  . Anxiety     but not on any meds      Review of Systems     Objective:   Physical Exam Filed Vitals:   01/20/15 1155  BP: 160/82  Pulse: 79  Height: _0  (1.676 m)  Weight: 231 lb (104.781 kg)  SpO2: 97%  RA  Gen: well appearing, no acute distress HENT: NCAT, OP clear, neck supple without masses Eyes: PERRL, EOMi Lymph: no cervical  lymphadenopathy PULM: Crackles left base and RUL CV: RRR, no mgr, no JVD GI: BS+, soft, nontender, no hsm Derm: no rash or skin breakdown, trace ankle edema MSK: normal bulk and tone Neuro: A&Ox4, CN II-XII intact, strength 5/5 in all 4 extremities Psyche: normal mood and affect         Assessment & Plan:  Interstitial lung disease This is a complicated case. She has a past medical history significant for heavy metal exposure when she was a child and her pulmonary fibrosis has been slow to progress on imaging. The CT scan in 2016 showed some progression compared to data 2008, but interestingly the CT was not consistent with UIP. 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 She does not have an underlying diagnosis of a connective tissue disease and a serology panel earlier this year was negative.  Moving forward, I think what makes the most sense is to treat this as if it is idiopathic pulmonary fibrosis because of her demographic and the biopsy report. However, the fact that his progress slowly over the last 8 years and she has some underlying granulomas makes me question whether or not this is truly IPF.  UIP can be an endpoint of multiple disease processes and I wonder whether or not she has some sort of atypical sarcoidosis or if even her heavy metal exposure early on in life has something to do with her pulmonary fibrosis now.  Plan: Start Ofev twice a day with liver function monitoring Start financial application for Ofev, risks and benefits were discussed including the fact that Ofev does not improve symptoms Refer to [facility] for a second opinion considering the granulomas seen on biopsy Plan on repeating pulmonary function testing in about 3-6 months 6 minute walk with pulmonary rehabilitation referral  Greater than 30 minutes were spent in direct consultation with the patient and her son today in clinic    Current  outpatient prescriptions:  .  albuterol (PROVENTIL HFA;VENTOLIN HFA) 108 (90 BASE) MCG/ACT inhaler, Inhale 2 puffs into the lungs every 6 (six) hours as needed for wheezing or shortness of breath. , Disp: , Rfl:  .  aspirin 325 MG tablet, Take 650 mg by mouth every 6 (six) hours as needed for mild pain or moderate pain. , Disp: , Rfl:  .  furosemide (LASIX) 20 MG tablet, Take 20 mg by mouth as needed for fluid. , Disp: , Rfl:  .  ibuprofen (ADVIL,MOTRIN) 200 MG tablet, Take 400 mg by mouth every 6 (six) hours as needed for mild pain or moderate pain. , Disp: , Rfl:  .  loratadine (CLARITIN) 5 MG chewable tablet, Chew 5 mg by mouth daily as needed for allergies., Disp: , Rfl:

## 2015-01-20 NOTE — Assessment & Plan Note (Signed)
This is a complicated case. She has a past medical history significant for heavy metal exposure when she was a child and her pulmonary fibrosis has been slow to progress on imaging. The CT scan in 2016 showed some progression compared to data 2008, but interestingly the CT was not consistent with UIP. 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 She does not have an underlying diagnosis of a connective tissue disease and a serology panel earlier this year was negative.  Moving forward, I think what makes the most sense is to treat this as if it is idiopathic pulmonary fibrosis because of her demographic and the biopsy report. However, the fact that his progress slowly over the last 8 years and she has some underlying granulomas makes me question whether or not this is truly IPF. UIP can be an endpoint of multiple disease processes and I wonder whether or not she has some sort of atypical sarcoidosis or if even her heavy metal exposure early on in life has something to do with her pulmonary fibrosis now.  Plan: Start Ofev twice a day with liver function monitoring Start financial application for Ofev, risks and benefits were discussed including the fact that Ofev does not improve symptoms Refer to [facility] for a second opinion considering the granulomas seen on biopsy Plan on repeating pulmonary function testing in about 3-6 months 6 minute walk with pulmonary rehabilitation referral  Greater than 30 minutes were spent in direct consultation with the patient and her son today in clinic

## 2015-01-22 ENCOUNTER — Telehealth (HOSPITAL_COMMUNITY): Payer: Self-pay

## 2015-01-22 NOTE — Telephone Encounter (Signed)
I have called and left a message with Yobana to inquire about participation in Pulmonary Rehab per Dr. Anastasia Pall referral. Will send letter in mail and follow up.

## 2015-01-26 ENCOUNTER — Telehealth: Payer: Self-pay | Admitting: *Deleted

## 2015-01-26 ENCOUNTER — Telehealth: Payer: Self-pay | Admitting: Pulmonary Disease

## 2015-01-26 NOTE — Telephone Encounter (Signed)
Received PA for Ofev 158m capsules. PA initiated thru Cover my meds. Key: [id number]. Will await response.

## 2015-01-26 NOTE — Telephone Encounter (Signed)
Per other PN 01/26/15 phone note: Renelda Mom, LPN at 1/77/1165 7:90 PM     Status: Signed       Expand All Collapse All   Received PA for Ofev 137m capsules. PA initiated thru Cover my meds. Key: [id number]. Will await response.      --  [place] spoke with [place] She reports the OFEV was denied d/t pt not meeting PA plan requirements. Per SIvin Bootyit stated that provider has a " known explanation for pt dx of ILD". Per sharon now an appeal will have to be done. This includes a letter stating why pt needs OV and how this will be effective for pt along with pt DX on it. This can be faxed to [phone] Please advise Dr. MLake Bellsthanks

## 2015-01-27 NOTE — Telephone Encounter (Signed)
I'm happy to complete the paperwork if needed

## 2015-01-27 NOTE — Telephone Encounter (Signed)
See other telephone note dated 01/26/15.  Duplicate messages.

## 2015-01-27 NOTE — Telephone Encounter (Signed)
Needs letter from Dr. Lake Bells stating why patient needs to be on OFEV and letter must have diagnosis codes on it.

## 2015-01-27 NOTE — Telephone Encounter (Signed)
Per [email or web address] Ofev 116m was denied. States they will send papers for an appeal. [facility] ACaryl Pinawill you see [facility]

## 2015-01-28 NOTE — Telephone Encounter (Signed)
To [name] for follow up on letter

## 2015-01-28 NOTE — Telephone Encounter (Signed)
[place], [phone] They got a denial for Ofev and wants to check to see if BQ plans on to appeal this or not.

## 2015-01-29 NOTE — Telephone Encounter (Signed)
lmtcb X1 for [name] with [facility] to make aware that a denial is being done.

## 2015-01-30 NOTE — Telephone Encounter (Signed)
Called and spoke to [name]. Informed her the denial is being appealed. [name] verbalized understanding. Will forward to [place] to follow.

## 2015-02-02 ENCOUNTER — Inpatient Hospital Stay (HOSPITAL_COMMUNITY): Admission: RE | Admit: 2015-02-02 | Payer: Self-pay | Source: Ambulatory Visit

## 2015-02-02 ENCOUNTER — Telehealth: Payer: Self-pay | Admitting: Pulmonary Disease

## 2015-02-02 MED ORDER — NINTEDANIB ESYLATE 150 MG PO CAPS: 150.0000 mg | ORAL_CAPSULE | Freq: Two times a day (BID) | ORAL | Status: DC

## 2015-02-02 NOTE — Telephone Encounter (Signed)
Called spke with Shanon Brow from [facility]. They needed an RX on file for the [place]. VO given. Nothing further needed

## 2015-02-02 NOTE — Telephone Encounter (Signed)
Letter has been sent to [facility].  Nothing further needed.

## 2015-02-03 ENCOUNTER — Telehealth: Payer: Self-pay | Admitting: Pulmonary Disease

## 2015-02-03 ENCOUNTER — Telehealth (HOSPITAL_COMMUNITY): Payer: Self-pay | Admitting: *Deleted

## 2015-02-03 DIAGNOSIS — R58 Hemorrhage, not elsewhere classified: Secondary | ICD-10-CM | POA: Diagnosis not present

## 2015-02-03 DIAGNOSIS — J8410 Pulmonary fibrosis, unspecified: Secondary | ICD-10-CM | POA: Diagnosis not present

## 2015-02-03 DIAGNOSIS — R609 Edema, unspecified: Secondary | ICD-10-CM | POA: Diagnosis not present

## 2015-02-03 NOTE — Telephone Encounter (Signed)
Spoke with Butch Penny at [employer], advised that I faxed an appeal letter yesterday evening (see 8/22 phone note).  Butch Penny advised to let her know if this appeal was denied and she can appeal again.  Nothing further needed at this time.

## 2015-02-05 ENCOUNTER — Telehealth: Payer: Self-pay | Admitting: Pulmonary Disease

## 2015-02-05 NOTE — Telephone Encounter (Signed)
lmtcb X1 for [name]

## 2015-02-05 NOTE — Telephone Encounter (Signed)
Spoke with Bonnita Nasuti at [employer], requesting info to appeal initial PA denial for Ofev.  I advised that I sent an appeal letter with wedge lung bx results and hrct results on 02/02/15 to [place].  (see previous phone notes).  Helen aware.  Nothing further needed at this time.

## 2015-02-06 ENCOUNTER — Telehealth: Payer: Self-pay | Admitting: Pulmonary Disease

## 2015-02-06 DIAGNOSIS — M65331 Trigger finger, right middle finger: Secondary | ICD-10-CM | POA: Diagnosis not present

## 2015-02-06 DIAGNOSIS — M65341 Trigger finger, right ring finger: Secondary | ICD-10-CM | POA: Diagnosis not present

## 2015-02-06 NOTE — Telephone Encounter (Signed)
Spoke with [name] at [facility], answered the one additional question needed for pt's Ofev appeal.  All info has been received and we will be notified of an outcome as it is available.  Nothing further needed at this time.

## 2015-02-06 NOTE — Telephone Encounter (Signed)
[name] returned call. This is the final day. They have to know something before 10am [phone]

## 2015-02-06 NOTE — Telephone Encounter (Signed)
OFEV has been approved from 01/26/15 - 01/26/16  FYI to [employer]

## 2015-02-10 DIAGNOSIS — R0602 Shortness of breath: Secondary | ICD-10-CM | POA: Diagnosis not present

## 2015-02-10 DIAGNOSIS — J679 Hypersensitivity pneumonitis due to unspecified organic dust: Secondary | ICD-10-CM | POA: Diagnosis not present

## 2015-02-13 DIAGNOSIS — Z96652 Presence of left artificial knee joint: Secondary | ICD-10-CM | POA: Diagnosis not present

## 2015-02-13 DIAGNOSIS — Z471 Aftercare following joint replacement surgery: Secondary | ICD-10-CM | POA: Diagnosis not present

## 2015-02-16 ENCOUNTER — Encounter (HOSPITAL_COMMUNITY)
Admission: RE | Admit: 2015-02-16 | Discharge: 2015-02-16 | Disposition: A | Discharge status: Home / Self Care | Payer: Medicare Other | Source: Ambulatory Visit | Attending: Pulmonary Disease | Admitting: Pulmonary Disease

## 2015-02-16 ENCOUNTER — Encounter (HOSPITAL_COMMUNITY): Payer: Self-pay

## 2015-02-16 VITALS — BP 142/58 | HR 98

## 2015-02-16 DIAGNOSIS — J8410 Pulmonary fibrosis, unspecified: Secondary | ICD-10-CM

## 2015-02-16 DIAGNOSIS — J84112 Idiopathic pulmonary fibrosis: Secondary | ICD-10-CM | POA: Insufficient documentation

## 2015-02-16 NOTE — Progress Notes (Signed)
[name] 74 y.o. female Pulmonary Rehab Orientation Note Patient arrived today in Cardiac and Pulmonary Rehab for orientation to Pulmonary Rehab. She was transported from [employer] via wheel chair. She does not carry portable oxygen. Per pt, she never uses oxygen.  Color good, skin warm and dry. Patient is oriented to time and place. Patient's medical history and medications reviewed. Heart rate is normal, breath sounds clear to auscultation, no wheezes, rales, or rhonchi with crackles in the bases.  Grip strength equal, strong. Distal pulses 3+ bilateral pedal pulses present without peripheral edema . Patient reports she does take medications as prescribed. Patient states she follows a Regular diet.  The patient reports no specific efforts to gain or lose weight.. Patient's weight will be monitored closely. Demonstration and practice of PLB using pulse oximeter. Patient able to return demonstration satisfactorily. Safety and hand hygiene in the exercise area reviewed with patient. Patient voices understanding of the information reviewed. Department expectations discussed with patient and achievable goals were set. The patient shows enthusiasm about attending the program and we look forward to working with this nice lady. The patient is scheduled for a 6 min walk test on Thursday, February 19, 2015 @ 3:30pm and to begin exercise on Tuesday, February 24, 2015 in the 1:30pm class.  She is very active and does not have severe shortness of breath, but does have a new diagnosis of pulmonary fibrosis which she needs education to understand the disease process.  Her pulmonologist has prescribed Ofev for her to begin taking, but she wants to discuss this with Dr. Lake Bells before deciding whether she is at a stage in her disease that she should begin taking it since it is very expensive.  We discussed gym safety measures and department guidelines and rules.   [id number]

## 2015-02-18 DIAGNOSIS — J849 Interstitial pulmonary disease, unspecified: Secondary | ICD-10-CM | POA: Diagnosis not present

## 2015-02-19 ENCOUNTER — Encounter (HOSPITAL_COMMUNITY)
Admission: RE | Admit: 2015-02-19 | Discharge: 2015-02-19 | Disposition: A | Discharge status: Home / Self Care | Payer: Medicare Other | Source: Ambulatory Visit | Attending: Pulmonary Disease | Admitting: Pulmonary Disease

## 2015-02-19 DIAGNOSIS — J84112 Idiopathic pulmonary fibrosis: Secondary | ICD-10-CM | POA: Diagnosis not present

## 2015-02-19 NOTE — Progress Notes (Signed)
Monae completed a Six-Minute Walk Test on 02/19/15 . Shantal walked 1218 feet with 0 breaks.  The patient's lowest oxygen saturation was 90 %, highest heart rate was 118 bpm , and highest blood pressure was 180/80. The patient was on room air. Patient stated that nothing hindered their walk test.

## 2015-02-20 ENCOUNTER — Ambulatory Visit (HOSPITAL_COMMUNITY): Payer: Self-pay

## 2015-02-23 ENCOUNTER — Telehealth: Payer: Self-pay | Admitting: Pulmonary Disease

## 2015-02-23 NOTE — Telephone Encounter (Signed)
Ms. Barfuss was going to think about her options (Ofev vs Esbriet) and then call me back. Haven't heard from her yet

## 2015-02-23 NOTE — Telephone Encounter (Signed)
[name] from [employer] spoke with patient today regarding OFEV. [name] says that she has not been in to see Dr. Lake Bells to discuss the [place] yet and wants to know if the "decision has been made for patient to take OFEV?"

## 2015-02-24 ENCOUNTER — Encounter (HOSPITAL_COMMUNITY)
Admission: RE | Admit: 2015-02-24 | Discharge: 2015-02-24 | Disposition: A | Discharge status: Home / Self Care | Payer: Medicare Other | Source: Ambulatory Visit | Attending: Pulmonary Disease | Admitting: Pulmonary Disease

## 2015-02-24 DIAGNOSIS — J84112 Idiopathic pulmonary fibrosis: Secondary | ICD-10-CM | POA: Diagnosis not present

## 2015-02-24 NOTE — Progress Notes (Signed)
Today, [name] exercised at [employer]. [facility]. Service time was from 1330to 1500.  The patient exercised by performing aerobic, strengthening, and stretching exercises. Oxygen saturation, heart rate, blood pressure, rate of perceived exertion, and shortness of breath were all monitored before, during, and after exercise. [name] presented with no problems at today's exercise session.  The patient did not have an increase in workload intensity during today's exercise session.  Pre-exercise vitals: . Weight kg: 105.4 . Liters of O2: RA . SpO2: 94 . HR: 97 . BP: 146/68 . CBG: NA  Exercise vitals: . Highest heartrate:  120 . Lowest oxygen saturation: 90 . Highest blood pressure: 160/56 . Liters of 02: RA  Post-exercise vitals: . SpO2: 95 . HR: 82 . BP: 128/60 . Liters of O2: RA . CBG: NA Dr. Brand Males, Medical Director Dr. Carles Collet is immediately available during today's Pulmonary Rehab session for [name] on 02/24/2015  at 1330 class time.

## 2015-02-24 NOTE — Telephone Encounter (Signed)
Called made [name] from [facility] aware. She for now will but this as a no start until we hear back.

## 2015-02-26 ENCOUNTER — Encounter (HOSPITAL_COMMUNITY)
Admission: RE | Admit: 2015-02-26 | Discharge: 2015-02-26 | Disposition: A | Discharge status: Home / Self Care | Payer: Medicare Other | Source: Ambulatory Visit | Attending: Pulmonary Disease | Admitting: Pulmonary Disease

## 2015-02-26 DIAGNOSIS — J84112 Idiopathic pulmonary fibrosis: Secondary | ICD-10-CM | POA: Diagnosis not present

## 2015-02-26 NOTE — Progress Notes (Signed)
Today, [name] exercised at [employer]. [facility]. Service time was from 1330 to 1520.  The patient exercised by performing aerobic, strengthening, and stretching exercises. Oxygen saturation, heart rate, blood pressure, rate of perceived exertion, and shortness of breath were all monitored before, during, and after exercise. [name] presented with no problems at today's exercise session.  Patient attended the Signs and Symptoms class today.  The patient did  have an increase in workload intensity during today's exercise session.  Pre-exercise vitals: . Weight kg: 105.9 . Liters of O2: ra . SpO2: 95 . HR: 81 . BP: 120/64 . CBG: na  Exercise vitals: . Highest heartrate:  118 . Lowest oxygen saturation: 89 . Highest blood pressure:  . Liters of 02: ra  Post-exercise vitals: . SpO2: 93 . HR: 91 . BP: 104/64 . Liters of O2: ra . CBG: na Dr. Brand Males, Medical Director Dr. Ree Kida is immediately available during today's Pulmonary Rehab session for [name] on 02/26/2015  at 1330 class time.  Marland Kitchen

## 2015-03-02 DIAGNOSIS — Z23 Encounter for immunization: Secondary | ICD-10-CM | POA: Diagnosis not present

## 2015-03-03 ENCOUNTER — Encounter (HOSPITAL_COMMUNITY)
Admission: RE | Admit: 2015-03-03 | Discharge: 2015-03-03 | Disposition: A | Discharge status: Home / Self Care | Payer: Medicare Other | Source: Ambulatory Visit | Attending: Pulmonary Disease | Admitting: Pulmonary Disease

## 2015-03-03 DIAGNOSIS — J84112 Idiopathic pulmonary fibrosis: Secondary | ICD-10-CM | POA: Diagnosis not present

## 2015-03-03 NOTE — Progress Notes (Signed)
Today, [name] exercised at [employer]. [facility]. Service time was from 1330 to 1500.  The patient exercised by performing aerobic, strengthening, and stretching exercises. Oxygen saturation, heart rate, blood pressure, rate of perceived exertion, and shortness of breath were all monitored before, during, and after exercise. [name] presented with no problems at today's exercise session.  The patient did not have an increase in workload intensity during today's exercise session.  Pre-exercise vitals: . Weight kg: 106.1 . Liters of O2: ra . SpO2: 96 . HR: 97 . BP: 138/70 . CBG: na  Exercise vitals: . Highest heartrate:  120 decreased to 92 with rest . Lowest oxygen saturation: 88 . Highest blood pressure: 170/80 . Liters of 02: ra  Post-exercise vitals: . SpO2: 94 . HR: 85 . BP: 130/60 . Liters of O2: ra . CBG: na  Dr. Brand Males, Medical Director Dr. Ree Kida is immediately available during today's Pulmonary Rehab session for [name] on 03/03/2015 at 1330 class time.

## 2015-03-04 ENCOUNTER — Ambulatory Visit (INDEPENDENT_AMBULATORY_CARE_PROVIDER_SITE_OTHER): Payer: Medicare Other | Admitting: Pulmonary Disease

## 2015-03-04 ENCOUNTER — Encounter (INDEPENDENT_AMBULATORY_CARE_PROVIDER_SITE_OTHER): Payer: Self-pay

## 2015-03-04 ENCOUNTER — Encounter: Payer: Self-pay | Admitting: Pulmonary Disease

## 2015-03-04 VITALS — BP 126/74 | HR 93 | Ht 67.0 in | Wt 232.0 lb

## 2015-03-04 DIAGNOSIS — J849 Interstitial pulmonary disease, unspecified: Secondary | ICD-10-CM | POA: Diagnosis not present

## 2015-03-04 MED ORDER — FLUTTER DEVI: Status: DC

## 2015-03-04 NOTE — Assessment & Plan Note (Signed)
She saw my colleague Dr. Dorothyann Peng at the [facility] interstitial lung disease clinic earlier this month and after his assessment of her biopsy, her imaging, her lung function testing, and her history he agrees that this course does not fit with atypical diagnosis of idiopathic pulmonary fibrosis. He feels that she has chronic hypersensitivity pneumonitis. This sounds reasonable considering the granulomas seen on the biopsy and the atypical appearance of her CT chest. Was unclear as what may have been causing this. I'm going to order a hypersensitivity pneumonitis panel today, though in the setting of chronic hypersensitivity pneumonitis we minute be able to identify a pathogen.  I agree with Dr. Dorothyann Peng that there is not an indication for anti-fibrotic therapy with a diagnosis of chronic hypersensitivity pneumonitis. That said, I would prefer to monitor her carefully to ensure that she does not develop worsening fibrotic changes. If we see a decline in her pulmonary function testing that we may want to repeat a CT chest to see if she develops classic UIP type imaging findings which may indicate concomitant idiopathic pulmonary fibrosis.  Plan: Repeat pulmonary function testing in March 2017 Continue pulmonary rehabilitation Hold off on antibiotics fibrotic therapy for now Check hypersensitivity pneumonitis panel today.

## 2015-03-04 NOTE — Progress Notes (Signed)
Subjective:    Patient ID: [name], female    DOB: [birth year], 74 y.o.   MRN: [id number]  Synopsis: Former patient of Dr. 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 As summarized by Dr. Gwenette Smith: CT chest 2008:  Mild bronchiectasis, ?NSIP: CXR 05/2014:  Progressive IS changes compared to 2012. 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  HRCT 2016:  Changes c/w fibrotic NSIP?  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 Dorothyann Smith at [facility] in August 2016, felt to have chronic hypersensitivity pneumonitis 02/2015 6 Min Walk: 1218 feet. The patient's lowest oxygen saturation was 90 %, highest heart rate was 118 bpm , and highest blood pressure was 180/80  HPI Chief Complaint  Patient presents with  . Follow-up    Pt states that breathing is unchanged since last visit. Pt has not started Ofev due to concerns over side effects    She saw [facility] and decided not to take the Ofev.  Dr. Dorothyann Smith feels that she has chronic hypersensitivity pneumonitis. She has been doing OK, participating in pulmonary rehab. She has been coughing up yellow mucus lately, but this has been a chronic thing for her.  It's not worse lately. She says she has been dealing with this for years. She moved into a new house about 10 years ago, none since. She denies acid reflux. She says she does have some postnasal drip.   Past Medical History  Diagnosis Date  . GERD (gastroesophageal reflux disease)   . Allergic rhinitis     takes Claritin nightly  . Arthritis   .  Asthma   . Carpal tunnel syndrome   . Thyroid nodule   . Shortness of breath dyspnea     with exertion;Albuterol inhaler as needed  . History of bronchitis 3-46yr ago  . Joint pain   . Peripheral edema     takes Lasix daily  . History of colon polyps     benign  . Anxiety     but not on any meds      Review of Systems  Constitutional: Positive for fatigue. Negative for fever and chills.  HENT: Negative for postnasal drip, rhinorrhea and sinus pressure.   Respiratory: Positive for cough. Negative for shortness of breath and wheezing.   Cardiovascular: Negative for chest pain, palpitations and leg swelling.       Objective:   Physical Exam Filed Vitals:   03/04/15 1435  BP: 126/74  Pulse: 93  Height: _0  (1.702 m)  Weight: 232 lb (105.235 kg)  SpO2: 95%  RA  Gen: well appearing HENT: OP clear, TM's clear, neck supple PULM: CTA B, normal percussion CV: RRR, no mgr, trace edema GI: BS+, soft, nontender Derm: no cyanosis or rash Psyche: normal mood and affect  [facility] [place] pulmonary function testing were reviewed, see problem list overview.  August 2016 liver function testing normal      Assessment & Plan:  Interstitial lung disease She [facility] my [facility] and after his assessment of her biopsy, her imaging, her lung function testing, and her history he agrees that this course does not fit with atypical diagnosis of idiopathic pulmonary fibrosis. He feels that she has chronic hypersensitivity pneumonitis. This sounds reasonable considering the granulomas seen on the biopsy and the atypical appearance of her CT chest. Was unclear as what may have been causing this. I'm going to order a hypersensitivity pneumonitis panel today, though in the setting of chronic hypersensitivity pneumonitis we minute be able to identify a pathogen.  I agree with Dr.  Dorothyann Smith that there is not an indication for anti-fibrotic therapy with a diagnosis of chronic hypersensitivity pneumonitis. That said, I would prefer to monitor her carefully to ensure that she does not develop worsening fibrotic changes. If we see a decline in her pulmonary function testing that we may want to repeat a CT chest to see if she develops classic UIP type imaging findings which may indicate concomitant idiopathic pulmonary fibrosis.  Plan: Repeat pulmonary function testing in March 2017 Continue pulmonary rehabilitation Hold off on antibiotics fibrotic therapy for now Check hypersensitivity pneumonitis panel today.   > 25 minutes spent  Current outpatient prescriptions:  .  albuterol (PROVENTIL HFA;VENTOLIN HFA) 108 (90 BASE) MCG/ACT inhaler, Inhale 2 puffs into the lungs every 6 (six) hours as needed for wheezing or shortness of breath. , Disp: , Rfl:  .  aspirin 325 MG tablet, Take 650 mg by mouth every 6 (six) hours as needed for mild pain or moderate pain. , Disp: , Rfl:  .  furosemide (LASIX) 20 MG tablet, Take 20 mg by mouth as needed for fluid. , Disp: , Rfl:  .  ibuprofen (ADVIL,MOTRIN) 200 MG tablet, Take 400 mg by mouth every 6 (six) hours as needed for mild pain or moderate pain. , Disp: , Rfl:  .  loratadine (CLARITIN) 5 MG chewable tablet, Chew 5 mg by mouth daily as needed for allergies., Disp: , Rfl:  .  Nintedanib (OFEV) 150 MG CAPS, Take 150 mg by mouth 2 (two) times daily. (Patient not taking: Reported on 02/16/2015), Disp: 60 capsule, Rfl: 6

## 2015-03-04 NOTE — Patient Instructions (Signed)
When you become ill with a cough and increased mucus production, please call us so we can prescribe and antibiotics Use the flutter valve 4-5 breaths, 4-5 times a day, particularly when you have an increased cough When you have increased cough and mucus production I recommend that you use Mucinex (guaifenesin) 1200 mg extended release twice a day We will see you back in 3 months or sooner if needed

## 2015-03-04 NOTE — Addendum Note (Signed)
Addended by: Levander Campion on: 03/04/2015 03:21 PM   Modules accepted: Orders

## 2015-03-05 ENCOUNTER — Encounter (HOSPITAL_COMMUNITY)
Admission: RE | Admit: 2015-03-05 | Discharge: 2015-03-05 | Disposition: A | Discharge status: Home / Self Care | Payer: Medicare Other | Source: Ambulatory Visit | Attending: Pulmonary Disease | Admitting: Pulmonary Disease

## 2015-03-05 DIAGNOSIS — J84112 Idiopathic pulmonary fibrosis: Secondary | ICD-10-CM | POA: Diagnosis not present

## 2015-03-05 NOTE — Progress Notes (Signed)
Today, Feige exercised at [facility]. Service time was from 1330 to 1530.  The patient exercised by performing aerobic, strengthening, and stretching exercises. Oxygen saturation, heart rate, blood pressure, rate of perceived exertion, and shortness of breath were all monitored before, during, and after exercise. Iolanda presented with no problems at today's exercise session. Patient attended the Anatomy and Physiology class today.    The patient did  have an increase in workload intensity during today's exercise session.  Pre-exercise vitals: . Weight kg: 106.5 . Liters of O2: ra . SpO2: 94 . HR: 97 . BP: 128/60 . CBG: na  Exercise vitals: . Highest heartrate:  109 . Lowest oxygen saturation: 89 . Highest blood pressure: 140/62 . Liters of 02: ra  Post-exercise vitals: . SpO2: 92 . HR: 87 . BP: 120/78 . Liters of O2: ra . CBG: na Dr. Brand Males, Medical Director Dr. Carles Collet is immediately available during today's Pulmonary Rehab session for Lorell G Claros on 03/05/2015  at 1330 class time.  Marland Kitchen

## 2015-03-10 ENCOUNTER — Encounter (HOSPITAL_COMMUNITY)
Admission: RE | Admit: 2015-03-10 | Discharge: 2015-03-10 | Disposition: A | Discharge status: Home / Self Care | Payer: Medicare Other | Source: Ambulatory Visit | Attending: Pulmonary Disease | Admitting: Pulmonary Disease

## 2015-03-10 DIAGNOSIS — J84112 Idiopathic pulmonary fibrosis: Secondary | ICD-10-CM | POA: Insufficient documentation

## 2015-03-10 NOTE — Progress Notes (Signed)
Today, [name] exercised at [facility]. Service time was from 1:30pm to 3:10pm.  The patient exercised by performing aerobic, strengthening, and stretching exercises. Oxygen saturation, heart rate, blood pressure, rate of perceived exertion, and shortness of breath were all monitored before, during, and after exercise. [name] presented with no problems at today's exercise session.  The patient did not have an increase in workload intensity during today's exercise session.  Pre-exercise vitals: . Weight kg: 106.2 . Liters of O2: ra . SpO2: 95 . HR: 96 . BP: 144/66 . CBG: na  Exercise vitals: . Highest heartrate:  114 . Lowest oxygen saturation: 88 . Highest blood pressure: 114 . Liters of 02: ra  Post-exercise vitals: . SpO2: 94 . HR: 91 . BP: 104/60 . Liters of O2: ra . CBG: na  Dr. Brand Males, Medical Director Dr. Allyson Sabal is immediately available during today's Pulmonary Rehab session for [name] on 03/10/15 at 1:30pm class time

## 2015-03-12 ENCOUNTER — Encounter (HOSPITAL_COMMUNITY)
Admission: RE | Admit: 2015-03-12 | Discharge: 2015-03-12 | Disposition: A | Discharge status: Home / Self Care | Payer: Medicare Other | Source: Ambulatory Visit | Attending: Pulmonary Disease | Admitting: Pulmonary Disease

## 2015-03-12 DIAGNOSIS — J84112 Idiopathic pulmonary fibrosis: Secondary | ICD-10-CM | POA: Diagnosis not present

## 2015-03-12 NOTE — Progress Notes (Signed)
Today, [name] exercised at [facility]. Service time was from 1:30pm to 3:30pm.  The patient exercised by performing aerobic, strengthening, and stretching exercises. Oxygen saturation, heart rate, blood pressure, rate of perceived exertion, and shortness of breath were all monitored before, during, and after exercise. [name] presented with no problems at today's exercise session. The patient attended education today with Derek M[facility] on Nutrition for the Pulmonary Patient.  The patient did not have an increase in workload intensity during today's exercise session.  Pre-exercise vitals: . Weight kg: 107.2 . Liters of O2: ra . SpO2: 95 . HR: 85 . BP: 132/64 . CBG: na  Exercise vitals: . Highest heartrate:  110 . Lowest oxygen saturation: 89 . Highest blood pressure: 140/60 . Liters of 02: ra  Post-exercise vitals: . SpO2: 94 . HR: 82 . BP: 130/64 . Liters of O2: ra . CBG: na  Dr. Brand Males, Medical Director Dr. Allyson Sabal is immediately available during today's Pulmonary Rehab session for Heylee G Twombly on 03/12/15 at 1:30pm class time

## 2015-03-17 ENCOUNTER — Encounter (HOSPITAL_COMMUNITY)
Admission: RE | Admit: 2015-03-17 | Discharge: 2015-03-17 | Disposition: A | Discharge status: Home / Self Care | Payer: Medicare Other | Source: Ambulatory Visit | Attending: Pulmonary Disease | Admitting: Pulmonary Disease

## 2015-03-17 DIAGNOSIS — J84112 Idiopathic pulmonary fibrosis: Secondary | ICD-10-CM | POA: Diagnosis not present

## 2015-03-17 NOTE — Progress Notes (Signed)
Today, [name] exercised at [facility]. Service time was from 1330 to 1500.  The patient exercised by performing aerobic, strengthening, and stretching exercises. Oxygen saturation, heart rate, blood pressure, rate of perceived exertion, and shortness of breath were all monitored before, during, and after exercise. [name] presented with no problems at today's exercise session.  The patient did not have an increase in workload intensity during today's exercise session.  Pre-exercise vitals: . Weight kg: 106.5 . Liters of O2: ra . SpO2: 95 . HR: 103 . BP: 134/76 . CBG: na  Exercise vitals: . Highest heartrate:  113 . Lowest oxygen saturation: 91 . Highest blood pressure: 128/62 . Liters of 02: ra  Post-exercise vitals: . SpO2: 94 . HR: 90 . BP: 118/64 . Liters of O2: ra . CBG: na  Dr. Brand Males, Medical Director Dr. Marily Memos is immediately available during today's Pulmonary Rehab session for [name] on 03/17/2015 at 1330 class time.

## 2015-03-19 ENCOUNTER — Encounter (HOSPITAL_COMMUNITY): Payer: Medicare Other

## 2015-03-19 DIAGNOSIS — L814 Other melanin hyperpigmentation: Secondary | ICD-10-CM | POA: Diagnosis not present

## 2015-03-19 DIAGNOSIS — D1801 Hemangioma of skin and subcutaneous tissue: Secondary | ICD-10-CM | POA: Diagnosis not present

## 2015-03-19 DIAGNOSIS — L821 Other seborrheic keratosis: Secondary | ICD-10-CM | POA: Diagnosis not present

## 2015-03-19 DIAGNOSIS — D225 Melanocytic nevi of trunk: Secondary | ICD-10-CM | POA: Diagnosis not present

## 2015-03-24 ENCOUNTER — Encounter (HOSPITAL_COMMUNITY)
Admission: RE | Admit: 2015-03-24 | Discharge: 2015-03-24 | Disposition: A | Discharge status: Home / Self Care | Payer: Medicare Other | Source: Ambulatory Visit | Attending: Pulmonary Disease | Admitting: Pulmonary Disease

## 2015-03-24 DIAGNOSIS — J84112 Idiopathic pulmonary fibrosis: Secondary | ICD-10-CM | POA: Diagnosis not present

## 2015-03-24 NOTE — Progress Notes (Signed)
Today, [name] exercised at [facility]. Service time was from 1:30pm to 3:10pm.  The patient exercised by performing aerobic, strengthening, and stretching exercises. Oxygen saturation, heart rate, blood pressure, rate of perceived exertion, and shortness of breath were all monitored before, during, and after exercise. [name] presented with no problems at today's exercise session.  The patient did not have an increase in workload intensity during today's exercise session.  Pre-exercise vitals: . Weight kg: 105.7 . Liters of O2: ra . SpO2: 97 . HR: 95 . BP: 110/60 . CBG: na  Exercise vitals: . Highest heartrate:  119 . Lowest oxygen saturation: 91 . Highest blood pressure: 144/70 . Liters of 02: ra  Post-exercise vitals: . SpO2: 96 . HR: 85 . BP: 132/70 . Liters of O2: ra . CBG: na  Dr. Brand Males, Medical Director Dr. Tamala Julian is immediately available during today's Pulmonary Rehab session for Marlaina G Bidwell on 03/24/15 at 1:30pm class time

## 2015-03-26 ENCOUNTER — Encounter (HOSPITAL_COMMUNITY)
Admission: RE | Admit: 2015-03-26 | Discharge: 2015-03-26 | Disposition: A | Discharge status: Home / Self Care | Payer: Medicare Other | Source: Ambulatory Visit | Attending: Pulmonary Disease | Admitting: Pulmonary Disease

## 2015-03-26 DIAGNOSIS — J84112 Idiopathic pulmonary fibrosis: Secondary | ICD-10-CM | POA: Diagnosis not present

## 2015-03-26 NOTE — Progress Notes (Signed)
Today, Jazmene exercised at [facility]. Service time was from 1330 to 1530.  The patient exercised by performing aerobic, strengthening, and stretching exercises. Oxygen saturation, heart rate, blood pressure, rate of perceived exertion, and shortness of breath were all monitored before, during, and after exercise. [name] presented with no problems at today's exercise session.She attended Advanced Directive class today.  The patient did not have an increase in workload intensity during today's exercise session.  Pre-exercise vitals: . Weight kg: 106.1 . Liters of O2: RA . SpO2: 94 . HR: 91 . BP: 144/64 . CBG: NA  Exercise vitals: . Highest heartrate:  119 . Lowest oxygen saturation: 89 . Highest blood pressure: 130/60 . Liters of 02: RA  Post-exercise vitals: . SpO2: 94 . HR: 94 . BP: 138/56 . Liters of O2: RA . CBG: NA Dr. Brand Males, Medical Director Dr. Ree Kida is immediately available during today's Pulmonary Rehab session for [name] on 03/26/2015  at 1330 class time.  Marland Kitchen

## 2015-03-31 ENCOUNTER — Encounter (HOSPITAL_COMMUNITY)
Admission: RE | Admit: 2015-03-31 | Discharge: 2015-03-31 | Disposition: A | Discharge status: Home / Self Care | Payer: Medicare Other | Source: Ambulatory Visit | Attending: Pulmonary Disease | Admitting: Pulmonary Disease

## 2015-03-31 DIAGNOSIS — J84112 Idiopathic pulmonary fibrosis: Secondary | ICD-10-CM | POA: Diagnosis not present

## 2015-03-31 NOTE — Progress Notes (Signed)
Today, [name] exercised at [facility]. Service time was from 1330 to 1455.  The patient exercised by performing aerobic, strengthening, and stretching exercises. Oxygen saturation, heart rate, blood pressure, rate of perceived exertion, and shortness of breath were all monitored before, during, and after exercise. [name] presented with no problems at today's exercise session.  The patient did not have an increase in workload intensity during today's exercise session.  Pre-exercise vitals: . Weight kg: 106.6 . Liters of O2: ra . SpO2: 98 . HR: 97 . BP: 140/78 . CBG: na  Exercise vitals: . Highest heartrate:  118 . Lowest oxygen saturation: 86 increased to 94 with plb . Highest blood pressure: 140/76 . Liters of 02: ra  Post-exercise vitals: . SpO2: 95 . HR: 89 . BP: 136/70 . Liters of O2: ra . CBG: na  Dr. Brand Males, Medical Director Dr. Wynelle Cleveland is immediately available during today's Pulmonary Rehab session for [name] on 03/31/2015 at 1330 class time.

## 2015-03-31 NOTE — Progress Notes (Signed)
I have reviewed a Home Exercise Prescription with [name] . [name] is not currently exercising at home.  The patient was advised to walk 2-3 days a week for 30 minutes.  [name] and I discussed how to progress their exercise prescription.  The patient stated that their goals were to live a long healthy life.  The patient stated that they understand the exercise prescription.  We reviewed exercise guidelines, target heart rate during exercise, oxygen use, weather, home pulse oximeter, endpoints for exercise, and goals.  Patient is encouraged to come to me with any questions. I will continue to follow up with the patient to assist them with progression and safety.

## 2015-04-02 ENCOUNTER — Encounter (HOSPITAL_COMMUNITY)
Admission: RE | Admit: 2015-04-02 | Discharge: 2015-04-02 | Disposition: A | Discharge status: Home / Self Care | Payer: Medicare Other | Source: Ambulatory Visit | Attending: Pulmonary Disease | Admitting: Pulmonary Disease

## 2015-04-02 DIAGNOSIS — J84112 Idiopathic pulmonary fibrosis: Secondary | ICD-10-CM | POA: Diagnosis not present

## 2015-04-02 NOTE — Progress Notes (Signed)
Today, [name] exercised at [facility]. Service time was from 1330 to 1520.  The patient exercised by performing aerobic, strengthening, and stretching exercises. Oxygen saturation, heart rate, blood pressure, rate of perceived exertion, and shortness of breath were all monitored before, during, and after exercise. [name] presented with no problems at today's exercise session. She attended diaphragmatic and purse lip breathing class today.  The patient did not have an increase in workload intensity during today's exercise session.  Pre-exercise vitals: . Weight kg: 106.9 . Liters of O2: ra . SpO2: 96 . HR: 83 . BP: 142/60 . CBG:   Exercise vitals: . Highest heartrate:  118 . Lowest oxygen saturation: 87 . Highest blood pressure: 164/66 . Liters of 02: ra  Post-exercise vitals: . SpO2: 95 . HR: 86 . BP: 120/60 . Liters of O2: ra . CBG: NA Dr. Brand Males, Medical Director Dr. Tamala Julian is immediately available during today's Pulmonary Rehab session for [name] on 04/02/2015  at 1330 class time  .

## 2015-04-07 ENCOUNTER — Encounter (HOSPITAL_COMMUNITY)
Admission: RE | Admit: 2015-04-07 | Discharge: 2015-04-07 | Disposition: A | Discharge status: Home / Self Care | Payer: Medicare Other | Source: Ambulatory Visit | Attending: Pulmonary Disease | Admitting: Pulmonary Disease

## 2015-04-07 DIAGNOSIS — J84112 Idiopathic pulmonary fibrosis: Secondary | ICD-10-CM | POA: Diagnosis not present

## 2015-04-07 NOTE — Progress Notes (Signed)
Today, Shontell exercised at [facility]. Service time was from 1330 to 1450.  The patient exercised by performing aerobic, strengthening, and stretching exercises. Oxygen saturation, heart rate, blood pressure, rate of perceived exertion, and shortness of breath were all monitored before, during, and after exercise. Marquesa presented with no problems at today's exercise session.  The patient did not have an increase in workload intensity during today's exercise session.  Pre-exercise vitals: . Weight kg: 107.6 . Liters of O2: RA . SpO2: 95 . HR: 94 . BP: 130/70 . CBG: NA  Exercise vitals: . Highest heartrate:  115 . Lowest oxygen saturation: 89 . Highest blood pressure: 118/70 . Liters of 02: RA  Post-exercise vitals: . SpO2: 94 . HR: 91 . BP: 112/60 . Liters of O2: RA . CBG: NA Dr. Brand Males, Medical Director Dr. Marily Memos is immediately available during today's Pulmonary Rehab session for Geonna G Claud on 04/07/2015  at 1330 class time  .

## 2015-04-09 ENCOUNTER — Encounter (HOSPITAL_COMMUNITY)
Admission: RE | Admit: 2015-04-09 | Discharge: 2015-04-09 | Disposition: A | Discharge status: Home / Self Care | Payer: Medicare Other | Source: Ambulatory Visit | Attending: Pulmonary Disease | Admitting: Pulmonary Disease

## 2015-04-09 DIAGNOSIS — J84112 Idiopathic pulmonary fibrosis: Secondary | ICD-10-CM | POA: Diagnosis not present

## 2015-04-09 NOTE — Progress Notes (Signed)
Today, [name] exercised at [facility]. Service time was from 1330 to 13530 The patient exercised by performing aerobic, strengthening, and stretching exercises. Oxygen saturation, heart rate, blood pressure, rate of perceived exertion, and shortness of breath were all monitored before, during, and after exercise. [name] presented with no problems at today's exercise session.Patient attended the Holiday Eating class today.   The patient did not have an increase in workload intensity during today's exercise session.  Pre-exercise vitals: . Weight kg: 106.5 . Liters of O2: ra . SpO2: 94 . HR: 82 . BP: 122/54 . CBG: na  Exercise vitals: . Highest heartrate:  106 . Lowest oxygen saturation: 94 . Highest blood pressure: 142/68 . Liters of 02: ra  Post-exercise vitals: . SpO2: 96 . HR: 87 . BP: 110/56 . Liters of O2: ra . CBG: na Dr. Brand Males, Medical Director Dr. Marily Memos is immediately available during today's Pulmonary Rehab session for [name] on 04/09/2015  at 1330 class time  .

## 2015-04-14 ENCOUNTER — Encounter (HOSPITAL_COMMUNITY): Payer: Medicare Other

## 2015-04-16 ENCOUNTER — Encounter (HOSPITAL_COMMUNITY): Payer: Medicare Other

## 2015-04-21 ENCOUNTER — Encounter (HOSPITAL_COMMUNITY)
Admission: RE | Admit: 2015-04-21 | Discharge: 2015-04-21 | Disposition: A | Discharge status: Home / Self Care | Payer: Medicare Other | Source: Ambulatory Visit | Attending: Pulmonary Disease | Admitting: Pulmonary Disease

## 2015-04-21 DIAGNOSIS — J84112 Idiopathic pulmonary fibrosis: Secondary | ICD-10-CM | POA: Diagnosis not present

## 2015-04-21 DIAGNOSIS — Z0000 Encounter for general adult medical examination without abnormal findings: Secondary | ICD-10-CM | POA: Diagnosis not present

## 2015-04-21 DIAGNOSIS — Z131 Encounter for screening for diabetes mellitus: Secondary | ICD-10-CM | POA: Diagnosis not present

## 2015-04-21 DIAGNOSIS — E559 Vitamin D deficiency, unspecified: Secondary | ICD-10-CM | POA: Diagnosis not present

## 2015-04-21 DIAGNOSIS — J8410 Pulmonary fibrosis, unspecified: Secondary | ICD-10-CM | POA: Diagnosis not present

## 2015-04-21 DIAGNOSIS — M2550 Pain in unspecified joint: Secondary | ICD-10-CM | POA: Diagnosis not present

## 2015-04-21 DIAGNOSIS — E7800 Pure hypercholesterolemia, unspecified: Secondary | ICD-10-CM | POA: Diagnosis not present

## 2015-04-21 NOTE — Progress Notes (Signed)
Today, [name] exercised at [facility]. Service time was from 1330 to 1500.  The patient exercised by performing aerobic, strengthening, and stretching exercises. Oxygen saturation, heart rate, blood pressure, rate of perceived exertion, and shortness of breath were all monitored before, during, and after exercise. [name] presented with no problems at today's exercise session.  The patient did not have an increase in workload intensity during today's exercise session.  Pre-exercise vitals: . Weight kg: 106.8 . Liters of O2: ra . SpO2: 94 . HR: 100 . BP: 176/60 . CBG: na  Exercise vitals: . Highest heartrate:  118 . Lowest oxygen saturation: 92 . Highest blood pressure: 140/80 . Liters of 02: ra  Post-exercise vitals: . SpO2: 93 . HR: 95 . BP: 138/66 . Liters of O2: ra . CBG: na Dr. Brand Males, Medical Director Dr. Marily Memos is immediately available during today's Pulmonary Rehab session for [name] on 04/21/2015  at 1330 class time  .

## 2015-04-23 ENCOUNTER — Encounter (HOSPITAL_COMMUNITY)
Admission: RE | Admit: 2015-04-23 | Discharge: 2015-04-23 | Disposition: A | Discharge status: Home / Self Care | Payer: Medicare Other | Source: Ambulatory Visit | Attending: Pulmonary Disease | Admitting: Pulmonary Disease

## 2015-04-23 DIAGNOSIS — J84112 Idiopathic pulmonary fibrosis: Secondary | ICD-10-CM | POA: Diagnosis not present

## 2015-04-23 NOTE — Progress Notes (Signed)
[name] 74 y.o. female Nutrition Note Spoke with pt. Pt is obese. Pt reports she wants to lose wt but is not actively trying to lose wt "other than exercising at rehab." Pt eats 3-4 small meals a day. There are some ways the pt can make her eating habits healthier.  Pt's Rate Your Plate results reviewed with pt. Pt does not avoid salty food; uses canned/ convenience food and eats out frequently. Pt adds salt to food. The role of sodium in lung disease reviewed with pt. Pt expressed understanding of the information reviewed.  No results found for: HGBA1C  Nutrition Diagnosis ? Excessive sodium intake related to over consumption of processed food as evidenced by frequent consumption of convenience food/ canned vegetables and eating out frequently. ? Food-and nutrition-related knowledge deficit related to lack of exposure to information as related to diagnosis of pulmonary disease ? Obesity related to excessive energy intake as evidenced by a BMI of 36.4 ?  Nutrition Rx/Est. Daily Nutrition Needs for: ? wt loss 1250-1750 Kcal  85-105 gm protein   1500 mg or less sodium Nutrition Intervention ? Pt's individual nutrition plan and goals reviewed with pt. ? Benefits of adopting healthy eating habits discussed when pt's Rate Your Plate reviewed. ? Pt to attend the Nutrition and Lung Disease class ? Pt given handout for Nutrition II class: Label Reading, Healthier Ways to Eat Out, and Recipe Modification ? Continual client-centered nutrition education by RD, as part of interdisciplinary care. Goal(s) 1. Pt to identify and limit food sources of sodium. 2. Pt to decrease empty calories consumed (e.g. Regular soda, candy) Monitor and Evaluate progress toward nutrition goal with team.   Derek Mound, M.Ed, RD, LDN, CDE 04/23/2015 3:28 PM

## 2015-04-23 NOTE — Progress Notes (Signed)
Today, [name] exercised at [facility]. Service time was from 1:30pm to 3:45pm.  The patient exercised by performing aerobic, strengthening, and stretching exercises. Oxygen saturation, heart rate, blood pressure, rate of perceived exertion, and shortness of breath were all monitored before, during, and after exercise. [name] presented with no problems at today's exercise session. The patient attended education today with [facility] [employer] on Exercise for the Pulmonary Patient.   The patient did not have an increase in workload intensity during today's exercise session.  Pre-exercise vitals: . Weight kg: 106.9 . Liters of O2: ra . SpO2: 94 . HR: 82 . BP: 146/64 . CBG: na  Exercise vitals: . Highest heartrate:  89 . Lowest oxygen saturation: 92 . Highest blood pressure: 160/72 . Liters of 02: ra  Post-exercise vitals: . SpO2: 96 . HR: 81 . BP: 134/66 . Liters of O2: ra . CBG: na  Dr. Brand Males, Medical Director Dr. Cruzita Lederer is immediately available during today's Pulmonary Rehab session for [name] on 04/23/15 at 1:30pm class time.

## 2015-04-28 ENCOUNTER — Encounter (HOSPITAL_COMMUNITY)
Admission: RE | Admit: 2015-04-28 | Discharge: 2015-04-28 | Disposition: A | Discharge status: Home / Self Care | Payer: Medicare Other | Source: Ambulatory Visit | Attending: Pulmonary Disease | Admitting: Pulmonary Disease

## 2015-04-28 DIAGNOSIS — J84112 Idiopathic pulmonary fibrosis: Secondary | ICD-10-CM | POA: Diagnosis not present

## 2015-04-28 NOTE — Progress Notes (Signed)
Today, [name] exercised at [facility]. Service time was from 1330 to 1535.  The patient exercised by performing aerobic, strengthening, and stretching exercises. Oxygen saturation, heart rate, blood pressure, rate of perceived exertion, and shortness of breath were all monitored before, during, and after exercise. [name] presented with no problems at today's exercise session.  She attended oxygen safety class today.  The patient did  have an increase in workload intensity during today's exercise session.  Pre-exercise vitals: . Weight kg: 107.2 . Liters of O2: RA . SpO2: 95 . HR: 83 . BP: 150/58 . CBG: NA  Exercise vitals: . Highest heartrate:  118 . Lowest oxygen saturation: 88 . Highest blood pressure: 174/70 . Liters of 02: RA  Post-exercise vitals: . SpO2: 96 . HR: 81 . BP: 120/58 . Liters of O2: RA . CBG: NA Dr. Brand Males, Medical Director Dr. Tana Coast is immediately available during today's Pulmonary Rehab session for [name] on 04/28/2015  at 1330 class time.  Marland Kitchen

## 2015-04-30 ENCOUNTER — Encounter (HOSPITAL_COMMUNITY): Payer: Medicare Other

## 2015-05-05 ENCOUNTER — Encounter (HOSPITAL_COMMUNITY)
Admission: RE | Admit: 2015-05-05 | Discharge: 2015-05-05 | Disposition: A | Discharge status: Home / Self Care | Payer: Medicare Other | Source: Ambulatory Visit | Attending: Pulmonary Disease | Admitting: Pulmonary Disease

## 2015-05-05 DIAGNOSIS — J84112 Idiopathic pulmonary fibrosis: Secondary | ICD-10-CM | POA: Diagnosis not present

## 2015-05-05 NOTE — Progress Notes (Signed)
Today, [name] exercised at [facility]. Service time was from 1330 to 1455.  The patient exercised by performing aerobic, strengthening, and stretching exercises. Oxygen saturation, heart rate, blood pressure, rate of perceived exertion, and shortness of breath were all monitored before, during, and after exercise. [name] presented with no problems at today's exercise session.  The patient did  have an increase in workload intensity during today's exercise session.  Pre-exercise vitals: . Weight kg: 106.4 . Liters of O2: RA . SpO2: 95 . HR: 84 . BP: 130/66 . CBG: NA  Exercise vitals: . Highest heartrate:  117 . Lowest oxygen saturation: 90 . Highest blood pressure: 156/68 . Liters of 02: RA  Post-exercise vitals: . SpO2: 93 . HR: 88 . BP: 120/56 . Liters of O2: RA . CBG: NA Dr. Brand Males, Medical Director Dr. [name] is immediately available during today's Pulmonary Rehab session for [name] on 05/05/2015  at 1330 class time.  Marland Kitchen

## 2015-05-07 ENCOUNTER — Encounter (HOSPITAL_COMMUNITY): Admission: RE | Admit: 2015-05-07 | Payer: Medicare Other | Source: Ambulatory Visit

## 2015-05-12 ENCOUNTER — Encounter (HOSPITAL_COMMUNITY): Payer: Medicare Other

## 2015-05-14 ENCOUNTER — Encounter (HOSPITAL_COMMUNITY)
Admission: RE | Admit: 2015-05-14 | Discharge: 2015-05-14 | Disposition: A | Discharge status: Home / Self Care | Payer: Medicare Other | Source: Ambulatory Visit | Attending: Pulmonary Disease | Admitting: Pulmonary Disease

## 2015-05-14 DIAGNOSIS — J84112 Idiopathic pulmonary fibrosis: Secondary | ICD-10-CM | POA: Insufficient documentation

## 2015-05-14 NOTE — Progress Notes (Signed)
Today, [name] exercised at [facility]. Service time was from 1330 to 1550.  The patient exercised by performing aerobic, strengthening, and stretching exercises. Oxygen saturation, heart rate, blood pressure, rate of perceived exertion, and shortness of breath were all monitored before, during, and after exercise. [name] presented with no problems at today's exercise session.Patient attended the Anatomy and Physiology class today.  The patient did  have an increase in workload intensity during today's exercise session.  Pre-exercise vitals: . Weight kg: 107.4 . Liters of O2: ra . SpO2: 92 . HR: 81 . BP: 148/66 . CBG: na  Exercise vitals: . Highest heartrate:  111 . Lowest oxygen saturation: 89 . Highest blood pressure: 162/80 . Liters of 02: ra  Post-exercise vitals: . SpO2: 95 . HR: 86 . BP: 148/70 . Liters of O2: ra . CBG: na Dr. Rush Farmer, Medical Director Dr. [name] is immediately available during today's Pulmonary Rehab session for [name] on 05/14/2015  at 1330 class time.  Marland Kitchen

## 2015-05-19 ENCOUNTER — Encounter (HOSPITAL_COMMUNITY)
Admission: RE | Admit: 2015-05-19 | Discharge: 2015-05-19 | Disposition: A | Discharge status: Home / Self Care | Payer: Medicare Other | Source: Ambulatory Visit | Attending: Pulmonary Disease | Admitting: Pulmonary Disease

## 2015-05-19 DIAGNOSIS — J8410 Pulmonary fibrosis, unspecified: Secondary | ICD-10-CM | POA: Diagnosis not present

## 2015-05-19 DIAGNOSIS — M2550 Pain in unspecified joint: Secondary | ICD-10-CM | POA: Diagnosis not present

## 2015-05-19 DIAGNOSIS — Z0000 Encounter for general adult medical examination without abnormal findings: Secondary | ICD-10-CM | POA: Diagnosis not present

## 2015-05-19 DIAGNOSIS — Z131 Encounter for screening for diabetes mellitus: Secondary | ICD-10-CM | POA: Diagnosis not present

## 2015-05-19 DIAGNOSIS — E7800 Pure hypercholesterolemia, unspecified: Secondary | ICD-10-CM | POA: Diagnosis not present

## 2015-05-19 DIAGNOSIS — J84112 Idiopathic pulmonary fibrosis: Secondary | ICD-10-CM | POA: Diagnosis not present

## 2015-05-19 DIAGNOSIS — E559 Vitamin D deficiency, unspecified: Secondary | ICD-10-CM | POA: Diagnosis not present

## 2015-05-19 NOTE — Progress Notes (Signed)
Today, [name] exercised at [facility]. Service time was from 1330 to 1500.  The patient exercised by performing aerobic, strengthening, and stretching exercises. Oxygen saturation, heart rate, blood pressure, rate of perceived exertion, and shortness of breath were all monitored before, during, and after exercise. [name] presented with no problems at today's exercise session.  The patient did not have an increase in workload intensity during today's exercise session.  Pre-exercise vitals: . Weight kg: 105.5 . Liters of O2: ra . SpO2: 96 . HR: 92 . BP: 150/80 . CBG: na  Exercise vitals: . Highest heartrate:  117 . Lowest oxygen saturation: 89 . Highest blood pressure: 140/70 . Liters of 02: ra  Post-exercise vitals: . SpO2: 94 . HR: 92 . BP: 134/50 . Liters of O2: ra . CBG: na Dr. Rush Farmer, Medical Director Dr. [name] is immediately available during today's Pulmonary Rehab session for [name] on 05/19/2015  at 1330 class time.  Marland Kitchen

## 2015-05-20 DIAGNOSIS — M7989 Other specified soft tissue disorders: Secondary | ICD-10-CM | POA: Diagnosis not present

## 2015-05-20 DIAGNOSIS — M25511 Pain in right shoulder: Secondary | ICD-10-CM | POA: Diagnosis not present

## 2015-05-20 DIAGNOSIS — M542 Cervicalgia: Secondary | ICD-10-CM | POA: Diagnosis not present

## 2015-05-20 DIAGNOSIS — R5383 Other fatigue: Secondary | ICD-10-CM | POA: Diagnosis not present

## 2015-05-20 DIAGNOSIS — M25512 Pain in left shoulder: Secondary | ICD-10-CM | POA: Diagnosis not present

## 2015-05-20 DIAGNOSIS — J849 Interstitial pulmonary disease, unspecified: Secondary | ICD-10-CM | POA: Diagnosis not present

## 2015-05-20 DIAGNOSIS — M0579 Rheumatoid arthritis with rheumatoid factor of multiple sites without organ or systems involvement: Secondary | ICD-10-CM | POA: Diagnosis not present

## 2015-05-21 ENCOUNTER — Encounter (HOSPITAL_COMMUNITY)
Admission: RE | Admit: 2015-05-21 | Discharge: 2015-05-21 | Disposition: A | Discharge status: Home / Self Care | Payer: Medicare Other | Source: Ambulatory Visit | Attending: Pulmonary Disease | Admitting: Pulmonary Disease

## 2015-05-21 DIAGNOSIS — J84112 Idiopathic pulmonary fibrosis: Secondary | ICD-10-CM | POA: Diagnosis not present

## 2015-05-21 NOTE — Progress Notes (Addendum)
Today, Tarisa exercised at [facility]. Service time was from 1330 to 1530.  The patient exercised by performing aerobic, strengthening, and stretching exercises. Oxygen saturation, heart rate, blood pressure, rate of perceived exertion, and shortness of breath were all monitored before, during, and after exercise. Alyss presented with no problems at today's exercise session.  The patient did not have an increase in workload intensity during today's exercise session.  Pre-exercise vitals: . Weight kg: 105.7 . Liters of O2: RA . SpO2: 95 . HR: 95 . BP: 140/64 . CBG: NA  Exercise vitals: . Highest heartrate:  97 . Lowest oxygen saturation: 93 . Highest blood pressure: 152/70 . Liters of 02: RA  Post-exercise vitals: . SpO2: 92 . HR: 87 . BP: 130/70 . Liters of O2: RA . CBG: NA Dr. Rush Farmer, Medical Director Dr. [name] is immediately available during today's Pulmonary Rehab session for Liliann G Navia on 1330 at 1530 class time.  Marland Kitchen

## 2015-05-26 ENCOUNTER — Encounter (HOSPITAL_COMMUNITY)
Admission: RE | Admit: 2015-05-26 | Discharge: 2015-05-26 | Disposition: A | Discharge status: Home / Self Care | Payer: Medicare Other | Source: Ambulatory Visit | Attending: Pulmonary Disease | Admitting: Pulmonary Disease

## 2015-05-26 DIAGNOSIS — J84112 Idiopathic pulmonary fibrosis: Secondary | ICD-10-CM | POA: Diagnosis not present

## 2015-05-26 NOTE — Progress Notes (Signed)
Today, [name] exercised at [facility]. Service time was from 1330 to 1440.  The patient exercised by performing aerobic, strengthening, and stretching exercises. Oxygen saturation, heart rate, blood pressure, rate of perceived exertion, and shortness of breath were all monitored before, during, and after exercise. [name] presented with no problems at today's exercise session.  The patient did not have an increase in workload intensity during today's exercise session.  Pre-exercise vitals: . Weight kg: 105.2 . Liters of O2: ra . SpO2: 95 . HR: 83 . BP: 140/68 . CBG: na  Exercise vitals: . Highest heartrate:  112 . Lowest oxygen saturation: 90 . Highest blood pressure: 122/72 . Liters of 02: ra  Post-exercise vitals: . SpO2: 98 . HR: 64 . BP: 148/56 . Liters of O2: ra . CBG: na Dr. Rush Farmer, Medical Director Dr. Marily Memos is immediately available during today's Pulmonary Rehab session for [name] on 05/26/2015  at 1330 class time  .

## 2015-05-28 ENCOUNTER — Encounter (HOSPITAL_COMMUNITY)
Admission: RE | Admit: 2015-05-28 | Discharge: 2015-05-28 | Disposition: A | Discharge status: Home / Self Care | Payer: Medicare Other | Source: Ambulatory Visit | Attending: Pulmonary Disease | Admitting: Pulmonary Disease

## 2015-05-28 DIAGNOSIS — J84112 Idiopathic pulmonary fibrosis: Secondary | ICD-10-CM | POA: Diagnosis not present

## 2015-05-28 NOTE — Progress Notes (Signed)
Today, [name] exercised at [facility]. Service time was from 1:30pm to 3:30pm.  The patient exercised by performing aerobic, strengthening, and stretching exercises. Oxygen saturation, heart rate, blood pressure, rate of perceived exertion, and shortness of breath were all monitored before, during, and after exercise. [name] presented with no problems at today's exercise session. The patient attended education today with [facility] [name] on Pursed Lip and Diaphragmatic Breathing.  The patient did not have an increase in workload intensity during today's exercise session.  Pre-exercise vitals: . Weight kg: 106.2 . Liters of O2: ra . SpO2: 95 . HR: 94 . BP: 126/70 . CBG: na  Exercise vitals: . Highest heartrate:  119 . Lowest oxygen saturation: 91 . Highest blood pressure: 138/70 . Liters of 02: ra  Post-exercise vitals: . SpO2: 96 . HR: 90 . BP: 118/60 . Liters of O2: ra . CBG: na  Dr. Rush Smith, Medical Director Dr. Clementeen Smith is immediately available during today's Pulmonary Rehab session for [name] on 05/28/15 at 1:30pm class time.

## 2015-06-02 ENCOUNTER — Encounter (HOSPITAL_COMMUNITY): Payer: Medicare Other

## 2015-06-04 ENCOUNTER — Encounter (HOSPITAL_COMMUNITY)
Admission: RE | Admit: 2015-06-04 | Discharge: 2015-06-04 | Disposition: A | Discharge status: Home / Self Care | Payer: Medicare Other | Source: Ambulatory Visit | Attending: Pulmonary Disease | Admitting: Pulmonary Disease

## 2015-06-04 DIAGNOSIS — J84112 Idiopathic pulmonary fibrosis: Secondary | ICD-10-CM | POA: Diagnosis not present

## 2015-06-04 NOTE — Progress Notes (Signed)
[name] completed a [place]Min[place] Walk Test on 06/04/15 . [name] walked 1265 feet with 0 breaks.  The patient's lowest oxygen saturation was 87 %, highest heart rate was 125 bpm , and highest blood pressure was 174/62. The patient was on room air. Patient stated that nothing hindered their walk test but that today was "not a good breathing day."

## 2015-06-05 ENCOUNTER — Ambulatory Visit (INDEPENDENT_AMBULATORY_CARE_PROVIDER_SITE_OTHER): Payer: Medicare Other | Admitting: Pulmonary Disease

## 2015-06-05 ENCOUNTER — Encounter: Payer: Self-pay | Admitting: Pulmonary Disease

## 2015-06-05 ENCOUNTER — Other Ambulatory Visit (INDEPENDENT_AMBULATORY_CARE_PROVIDER_SITE_OTHER): Payer: Medicare Other

## 2015-06-05 DIAGNOSIS — J849 Interstitial pulmonary disease, unspecified: Secondary | ICD-10-CM | POA: Diagnosis not present

## 2015-06-05 DIAGNOSIS — J84112 Idiopathic pulmonary fibrosis: Secondary | ICD-10-CM | POA: Diagnosis not present

## 2015-06-05 LAB — HEPATIC FUNCTION PANEL
ALT: 14 U/L (ref 0–35)
AST: 17 U/L (ref 0–37)
Albumin: 3.8 g/dL (ref 3.5–5.2)
Alkaline Phosphatase: 76 U/L (ref 39–117)
Bilirubin, Direct: 0.1 mg/dL (ref 0.0–0.3)
Total Bilirubin: 0.3 mg/dL (ref 0.2–1.2)
Total Protein: 7.2 g/dL (ref 6.0–8.3)

## 2015-06-05 NOTE — Assessment & Plan Note (Signed)
This has been a stable interval for her.  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  Unfortunately the hypersensitivity pneumonitis panel I ordered last month has not resulted. She has seen rheumatology as well and the lab work up is pending.    I hope that we are dealing with chronic HP as I suppose that would be a better diagnosis than IPF.  We are hoping that this won't progress as much.    Plan: Repeat PFT in 08/2015 Order Hypersensitivity pneumonitis panel Request records from Dr. Trudie Reed for her recent rheumatology evaluation Stay in pulmonary rehab

## 2015-06-05 NOTE — Patient Instructions (Signed)
Use Neil Med rinses with distilled water at least twice per day using the instructions on the package. 1/2 hour after using the [facility] Med rinse, use Nasacort two puffs in each nostril once per day.  Remember that the Nasacort can take 1-2 weeks to work after regular use. Use generic zyrtec (cetirizine) every day.  If this doesn't help, then stop taking it and use chlorpheniramine-phenylephrine combination tablets.  I will repeat lung function testing in March 2017 and see you after that Keep going to pulmonary rehabilitation I'm going to request records from Dr. Trudie Reed

## 2015-06-05 NOTE — Progress Notes (Signed)
Subjective:    Patient ID: [name], female    DOB: 1941/02/25, 74 y.o.   MRN: [id number]  Synopsis: Former patient of Dr. 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 As summarized by Dr. Gwenette Greet: CT chest 2008:  Mild bronchiectasis, ?NSIP: CXR 05/2014:  Progressive IS changes compared to 2012. 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  HRCT 2016:  Changes c/w fibrotic NSIP?  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 The patient's lowest oxygen saturation was 90 %, highest heart rate was 118 bpm , and highest blood pressure was 180/80  HPI Chief Complaint  Patient presents with  . Follow-up    6 minute walk done yesterday at [facility]; pt woke up last night with chest/head congestion.  coughing out light yellow mucus.      [name] has not made any formal investigation into why she may have hypersensitivity pneumonitis. She says that she really "just wants to get better", but she is not making any headway. She went to her PCP and was given an antibiotic for a cough she had a few weeks back. She traveled to [place] to visit her sister recently.   She suggested that she see Gavin Pound for some pain all over (chest, shoulders). She was given a steroid shot and her shoulder and chest tightness went away immediately and she felt great.  She felt that her breathing was better, but she was feeling somewhat euphoric at that  time. She had a large     Past Medical History  Diagnosis Date  . GERD (gastroesophageal reflux disease)   . Allergic rhinitis     takes Claritin nightly  . Arthritis   . Asthma   . Carpal tunnel syndrome   . Thyroid nodule   . Shortness of breath dyspnea     with exertion;Albuterol inhaler as needed  . History of bronchitis 3-70yr ago  . Joint pain   . Peripheral edema     takes Lasix daily  . History of colon polyps     benign  . Anxiety     but not on any meds      Review of Systems  Constitutional: Negative for fever, chills and fatigue.  HENT: Negative for postnasal drip, rhinorrhea and sinus pressure.   Respiratory: Negative for cough, shortness of breath and wheezing.   Cardiovascular: Negative for chest pain, palpitations and leg swelling.       Objective:   Physical Exam Filed Vitals:   06/05/15 1454  BP: 132/82  Pulse: 79  Height: _0  (1.702 m)  Weight: 231 lb 3.2 oz (104.872 kg)  SpO2: 94%  RA  Gen: well appearing HENT: OP clear, TM's clear, neck supple PULM: Crackles bases bilaterally, normal percussion CV: RRR, no mgr, trace edema GI: BS+, soft, nontender Derm: no cyanosis or rash Psyche: normal mood and affect        Assessment & Plan:  Interstitial lung disease  This has been a stable interval for her.  She is felt to have chronic hypersensitivity pneumonitis by the physicians at [facility].  Unfortunately the hypersensitivity pneumonitis panel I ordered last month has not resulted. She has seen rheumatology as well and the lab work up is pending.    I hope that we are dealing with chronic HP as I suppose that would be a better diagnosis than IPF.  We are hoping that this won't progress as much.    Plan: Repeat PFT in 08/2015 Order Hypersensitivity pneumonitis panel Request records from Dr. Trudie Reed for her recent rheumatology evaluation Stay in pulmonary rehab     Current outpatient prescriptions:  .  albuterol (PROVENTIL HFA;VENTOLIN  HFA) 108 (90 BASE) MCG/ACT inhaler, Inhale 2 puffs into the lungs every 6 (six) hours as needed for wheezing or shortness of breath. , Disp: , Rfl:  .  aspirin 325 MG tablet, Take 650 mg by mouth every 6 (six) hours as needed for mild pain or moderate pain. , Disp: , Rfl:  .  furosemide (LASIX) 20 MG tablet, Take 20 mg by mouth as needed for fluid. , Disp: , Rfl:  .  ibuprofen (ADVIL,MOTRIN) 200 MG tablet, Take 400 mg by mouth every 6 (six) hours as needed for mild pain or moderate pain. , Disp: , Rfl:  .  loratadine (CLARITIN) 5 MG chewable tablet, Chew 5 mg by mouth daily as needed for allergies., Disp: , Rfl:  .  Respiratory Therapy Supplies (FLUTTER) DEVI, Use as directed, Disp: 1 each, Rfl: 0

## 2015-06-09 NOTE — Progress Notes (Signed)
Pulmonary Rehab Discharge Note [name] has graduated from pulmonary rehab, she completed 22 exercise sessions.  She made a small increase in her walking distance by 3.7% at discharge, but she did improve her pulmonary education knowledge by 18%.  She did meet her program goals which were to start an exercise program and to gain knowledge about her pulmonary illness.  She will continue exercising at her apartment complex gym after graduating.

## 2015-06-10 DIAGNOSIS — J849 Interstitial pulmonary disease, unspecified: Secondary | ICD-10-CM | POA: Diagnosis not present

## 2015-06-10 DIAGNOSIS — M25511 Pain in right shoulder: Secondary | ICD-10-CM | POA: Diagnosis not present

## 2015-06-10 DIAGNOSIS — M7989 Other specified soft tissue disorders: Secondary | ICD-10-CM | POA: Diagnosis not present

## 2015-06-10 DIAGNOSIS — M0579 Rheumatoid arthritis with rheumatoid factor of multiple sites without organ or systems involvement: Secondary | ICD-10-CM | POA: Diagnosis not present

## 2015-06-10 DIAGNOSIS — M25512 Pain in left shoulder: Secondary | ICD-10-CM | POA: Diagnosis not present

## 2015-06-12 ENCOUNTER — Encounter: Payer: Self-pay | Admitting: Pulmonary Disease

## 2015-06-12 DIAGNOSIS — M069 Rheumatoid arthritis, unspecified: Secondary | ICD-10-CM | POA: Insufficient documentation

## 2015-08-29 DIAGNOSIS — J209 Acute bronchitis, unspecified: Secondary | ICD-10-CM | POA: Diagnosis not present

## 2015-09-02 ENCOUNTER — Other Ambulatory Visit: Payer: Self-pay | Admitting: Pulmonary Disease

## 2015-09-02 DIAGNOSIS — R0600 Dyspnea, unspecified: Secondary | ICD-10-CM

## 2015-09-03 ENCOUNTER — Ambulatory Visit (INDEPENDENT_AMBULATORY_CARE_PROVIDER_SITE_OTHER): Payer: Medicare Other | Admitting: Pulmonary Disease

## 2015-09-03 ENCOUNTER — Ambulatory Visit (INDEPENDENT_AMBULATORY_CARE_PROVIDER_SITE_OTHER)
Admission: RE | Admit: 2015-09-03 | Discharge: 2015-09-03 | Disposition: A | Discharge status: Home / Self Care | Payer: Medicare Other | Source: Ambulatory Visit | Attending: Pulmonary Disease | Admitting: Pulmonary Disease

## 2015-09-03 ENCOUNTER — Other Ambulatory Visit (INDEPENDENT_AMBULATORY_CARE_PROVIDER_SITE_OTHER): Payer: Self-pay

## 2015-09-03 ENCOUNTER — Encounter: Payer: Self-pay | Admitting: Pulmonary Disease

## 2015-09-03 VITALS — BP 128/70 | HR 83 | Ht 67.0 in | Wt 228.0 lb

## 2015-09-03 DIAGNOSIS — J679 Hypersensitivity pneumonitis due to unspecified organic dust: Secondary | ICD-10-CM

## 2015-09-03 DIAGNOSIS — J479 Bronchiectasis, uncomplicated: Secondary | ICD-10-CM

## 2015-09-03 DIAGNOSIS — R0600 Dyspnea, unspecified: Secondary | ICD-10-CM | POA: Diagnosis not present

## 2015-09-03 DIAGNOSIS — R509 Fever, unspecified: Secondary | ICD-10-CM | POA: Diagnosis not present

## 2015-09-03 DIAGNOSIS — M05739 Rheumatoid arthritis with rheumatoid factor of unspecified wrist without organ or systems involvement: Secondary | ICD-10-CM | POA: Diagnosis not present

## 2015-09-03 DIAGNOSIS — J849 Interstitial pulmonary disease, unspecified: Secondary | ICD-10-CM | POA: Diagnosis not present

## 2015-09-03 DIAGNOSIS — R05 Cough: Secondary | ICD-10-CM | POA: Diagnosis not present

## 2015-09-03 DIAGNOSIS — J84112 Idiopathic pulmonary fibrosis: Secondary | ICD-10-CM

## 2015-09-03 LAB — PULMONARY FUNCTION TEST
DL/VA % pred: 78 %
DL/VA: 4.03 ml/min/mmHg/L
DLCO cor % pred: 49 %
DLCO cor: 14.09 ml/min/mmHg
DLCO unc % pred: 51 %
DLCO unc: 14.7 ml/min/mmHg
FEF 25-75 Post: 1.99 L/sec
FEF 25-75 Pre: 1.81 L/sec
FEF2575-%Change-Post: 10 %
FEF2575-%Pred-Post: 110 %
FEF2575-%Pred-Pre: 100 %
FEV1-%Change-Post: 6 %
FEV1-%Pred-Post: 75 %
FEV1-%Pred-Pre: 70 %
FEV1-Post: 1.78 L
FEV1-Pre: 1.68 L
FEV1FVC-%Change-Post: 1 %
FEV1FVC-%Pred-Pre: 115 %
FEV6-%Change-Post: 4 %
FEV6-%Pred-Post: 66 %
FEV6-%Pred-Pre: 64 %
FEV6-Post: 2.01 L
FEV6-Pre: 1.93 L
FEV6FVC-%Change-Post: 0 %
FEV6FVC-%Pred-Post: 104 %
FEV6FVC-%Pred-Pre: 105 %
FVC-%Change-Post: 4 %
FVC-%Pred-Post: 64 %
FVC-%Pred-Pre: 61 %
FVC-Post: 2.03 L
FVC-Pre: 1.93 L
Post FEV1/FVC ratio: 88 %
Post FEV6/FVC ratio: 100 %
Pre FEV1/FVC ratio: 87 %
Pre FEV6/FVC Ratio: 100 %

## 2015-09-03 LAB — HEPATIC FUNCTION PANEL
ALT: 15 U/L (ref 0–35)
AST: 15 U/L (ref 0–37)
Albumin: 3.9 g/dL (ref 3.5–5.2)
Alkaline Phosphatase: 79 U/L (ref 39–117)
Bilirubin, Direct: 0.1 mg/dL (ref 0.0–0.3)
Total Bilirubin: 0.4 mg/dL (ref 0.2–1.2)
Total Protein: 7.4 g/dL (ref 6.0–8.3)

## 2015-09-03 NOTE — Progress Notes (Signed)
Subjective:    Patient ID: [name], female    DOB: Apr 11, 1941, 75 y.o.   MRN: [id number]  Synopsis: Former patient of Dr. 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 As summarized by Dr. Gwenette Greet: CT chest 2008:  Mild bronchiectasis, ?NSIP: CXR 05/2014:  Progressive IS changes compared to 2012. 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  HRCT 2016:  Changes c/w fibrotic NSIP?  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 The patient's lowest oxygen saturation was 90 %, highest heart rate was 118 bpm , and highest blood pressure was 180/80 March 2017 pulmonary function testing ratio 88%, FVC 2.03 L (84% predicted), total lung capacity 3.0 L (54% predicted), DLCO 14.7 (51% predicted)  HPI Chief Complaint  Patient presents with  . Follow-up    review pft. Pt c/o sob, fatigue, chest tightness X1 wk.  pt saw UC on Saturday, was put on Levaquin and promethadine-codeine cough syrup.      [name] went to an urgent care last week. She was having more cough, sick contacts from her children, fever Friday night.  She woke up on Saturday with more cough, fever and rattling in her chest.    Her breathing had been about the same since the last visit.  She had some cough, mucus production.   Her son is with her today and notes that she was really active lately with her sister who was in town visiting.     Past Medical History    Diagnosis Date  . GERD (gastroesophageal reflux disease)   . Allergic rhinitis     takes Claritin nightly  . Arthritis   . Asthma   . Carpal tunnel syndrome   . Thyroid nodule   . Shortness of breath dyspnea     with exertion;Albuterol inhaler as needed  . History of bronchitis 3-60yr ago  . Joint pain   . Peripheral edema     takes Lasix daily  . History of colon polyps     benign  . Anxiety     but not on any meds      Review of Systems  Constitutional: Negative for fever, chills and fatigue.  HENT: Negative for postnasal drip, rhinorrhea and sinus pressure.   Respiratory: Negative for cough, shortness of breath and wheezing.   Cardiovascular: Negative for chest pain, palpitations and leg swelling.       Objective:   Physical Exam Filed Vitals:   09/03/15 1208  BP: 128/70  Pulse: 83  Height: 5' 7" (1.702 m)  Weight: 228 lb (103.42 kg)  SpO2: 97%  RA  Gen: well appearing HENT: OP clear, TM's clear, neck supple PULM: Crackles bases bilaterally, wheezing upper lobes CV: RRR, no mgr, trace edema GI: BS+, soft, nontender Derm: no cyanosis or rash Psyche: normal mood and affect  chart reviewed, still no hypersensitivity pneumonitis panel resulted  Clinic notes from rheumatology reviewed, elevated CCP and a presumed diagnosis of rheumatoid  arthritis      Assessment & Plan:  Interstitial lung disease ([place]) [name] has interstitial lung disease which is believed to be related to hypersensitivity pneumonitis on the grounds that she used to live in a moldy home that gave her significant respiratory complaints and she had a granuloma on her biopsy in 2016 that also showed usual interstitial pneumonitis. Interestingly, she may have a diagnosis of rheumatoid arthritis. Fortunately, there has been no evidence of disease progression based on her pulmonary function test today.  Plan: Continue expectant management with serial visits for clinical  monitoring Follow-up 6 months Repeat 6 minute walk test in 6 months Pulmonary function testing one year from now   Bronchiectasis without acute exacerbation She had some bronchiectasis seen on her CT chest when imaged previously.  Most recently, she's been suffering from likely a case of viral bronchitis which was treated with Levaquin by an urgent care physician. I think this decision was reasonable considering the underlying bronchiectasis. She is starting to feel better but she continues to have an abnormal lung exam today.  Plan: Voice rest was encouraged Chest x-ray today to ensure there is no evidence of pneumonia Continue antibiotics as prescribed by her urgent care physician  Rheumatoid arthritis ([place]) Continue follow-up with Dr. Trudie Reed     Current outpatient prescriptions:  .  albuterol (PROVENTIL HFA;VENTOLIN HFA) 108 (90 BASE) MCG/ACT inhaler, Inhale 2 puffs into the lungs every 6 (six) hours as needed for wheezing or shortness of breath. , Disp: , Rfl:  .  aspirin 325 MG tablet, Take 650 mg by mouth every 6 (six) hours as needed for mild pain or moderate pain. , Disp: , Rfl:  .  furosemide (LASIX) 20 MG tablet, Take 20 mg by mouth as needed for fluid. , Disp: , Rfl:  .  ibuprofen (ADVIL,MOTRIN) 200 MG tablet, Take 400 mg by mouth every 6 (six) hours as needed for mild pain or moderate pain. , Disp: , Rfl:  .  levofloxacin (LEVAQUIN) 500 MG tablet, Take 500 mg by mouth daily., Disp: , Rfl:  .  loratadine (CLARITIN) 5 MG chewable tablet, Chew 5 mg by mouth daily as needed for allergies., Disp: , Rfl:  .  promethazine-codeine (PHENERGAN WITH CODEINE) 6.25-10 MG/5ML syrup, Take 5 mLs by mouth every 8 (eight) hours as needed for cough., Disp: , Rfl:  .  Respiratory Therapy Supplies (FLUTTER) DEVI, Use as directed, Disp: 1 each, Rfl: 0

## 2015-09-03 NOTE — Assessment & Plan Note (Signed)
Continue follow-up with Dr. Trudie Reed

## 2015-09-03 NOTE — Patient Instructions (Addendum)
I would like for you to look around your house for mold, mildew, or excessive dust  We will call you with the results of the Chest X-ray  Use hard candies, warm beverages and Delsym for the cough  We will see you back in 6 months or sooner if needed

## 2015-09-03 NOTE — Progress Notes (Signed)
PFT done today. 

## 2015-09-03 NOTE — Assessment & Plan Note (Signed)
She had some bronchiectasis seen on her CT chest when imaged previously.  Most recently, she's been suffering from likely a case of viral bronchitis which was treated with Levaquin by an urgent care physician. I think this decision was reasonable considering the underlying bronchiectasis. She is starting to feel better but she continues to have an abnormal lung exam today.  Plan: Voice rest was encouraged Chest x-ray today to ensure there is no evidence of pneumonia Continue antibiotics as prescribed by her urgent care physician

## 2015-09-03 NOTE — Assessment & Plan Note (Signed)
Ms. Orser has interstitial lung disease which is believed to be related to hypersensitivity pneumonitis on the grounds that she used to live in a moldy home that gave her significant respiratory complaints and she had a granuloma on her biopsy in 2016 that also showed usual interstitial pneumonitis. Interestingly, she may have a diagnosis of rheumatoid arthritis. Fortunately, there has been no evidence of disease progression based on her pulmonary function test today.  Plan: Continue expectant management with serial visits for clinical monitoring Follow-up 6 months Repeat 6 minute walk test in 6 months Pulmonary function testing one year from now

## 2015-09-08 LAB — HYPERSENSITIVITY PNUEMONITIS PROFILE

## 2015-09-10 DIAGNOSIS — M150 Primary generalized (osteo)arthritis: Secondary | ICD-10-CM | POA: Diagnosis not present

## 2015-09-10 DIAGNOSIS — M0579 Rheumatoid arthritis with rheumatoid factor of multiple sites without organ or systems involvement: Secondary | ICD-10-CM | POA: Diagnosis not present

## 2015-09-10 DIAGNOSIS — J678 Hypersensitivity pneumonitis due to other organic dusts: Secondary | ICD-10-CM | POA: Diagnosis not present

## 2015-09-10 DIAGNOSIS — J849 Interstitial pulmonary disease, unspecified: Secondary | ICD-10-CM | POA: Diagnosis not present

## 2016-02-02 IMAGING — CR DG CHEST 2V
2 series · 2 of 2 positions shown · non-contrast
Comparison: 11/23/2014.

CLINICAL DATA: Interstitial fibrosis.

EXAM:
CHEST  2 VIEW

[w chest pa]
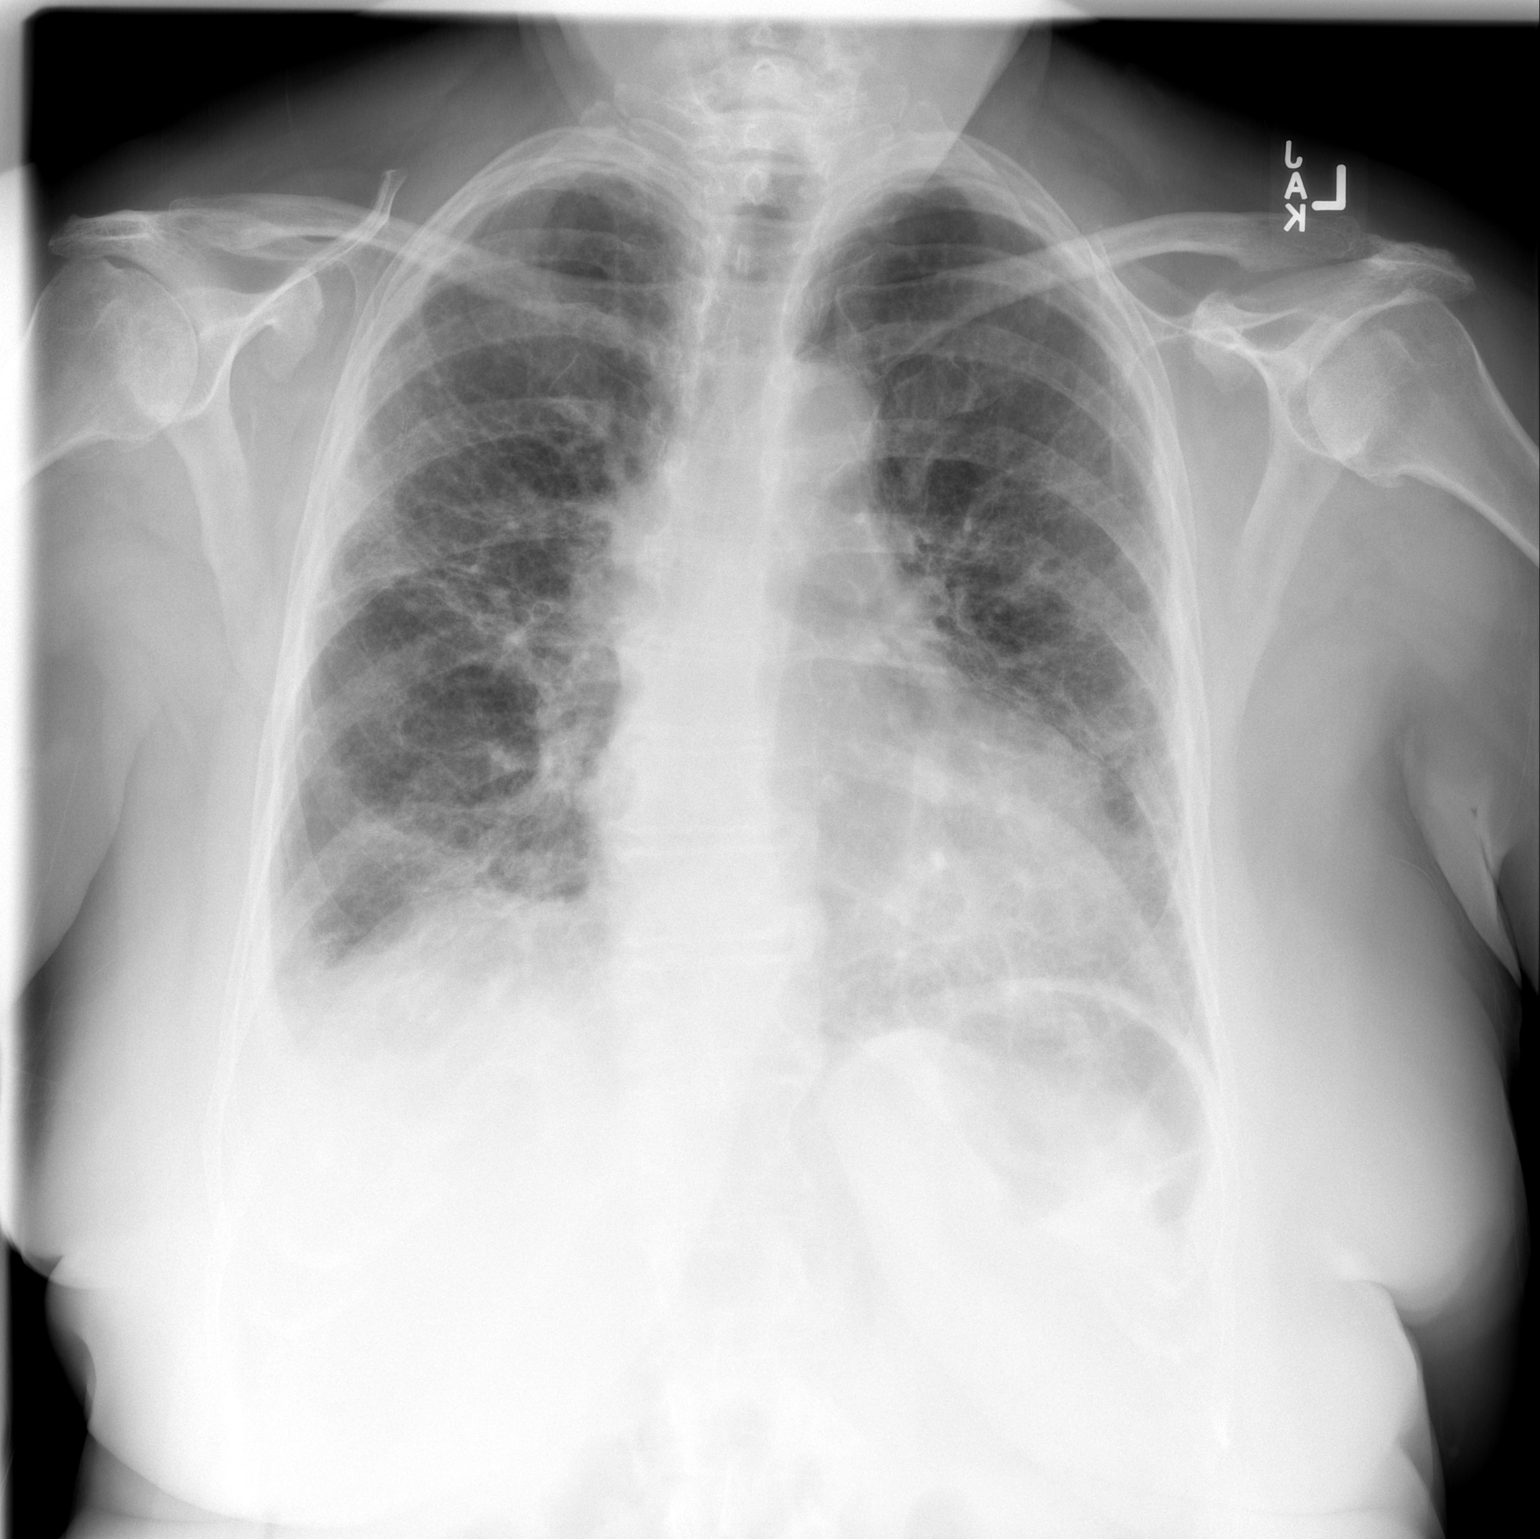

[w chest lat]
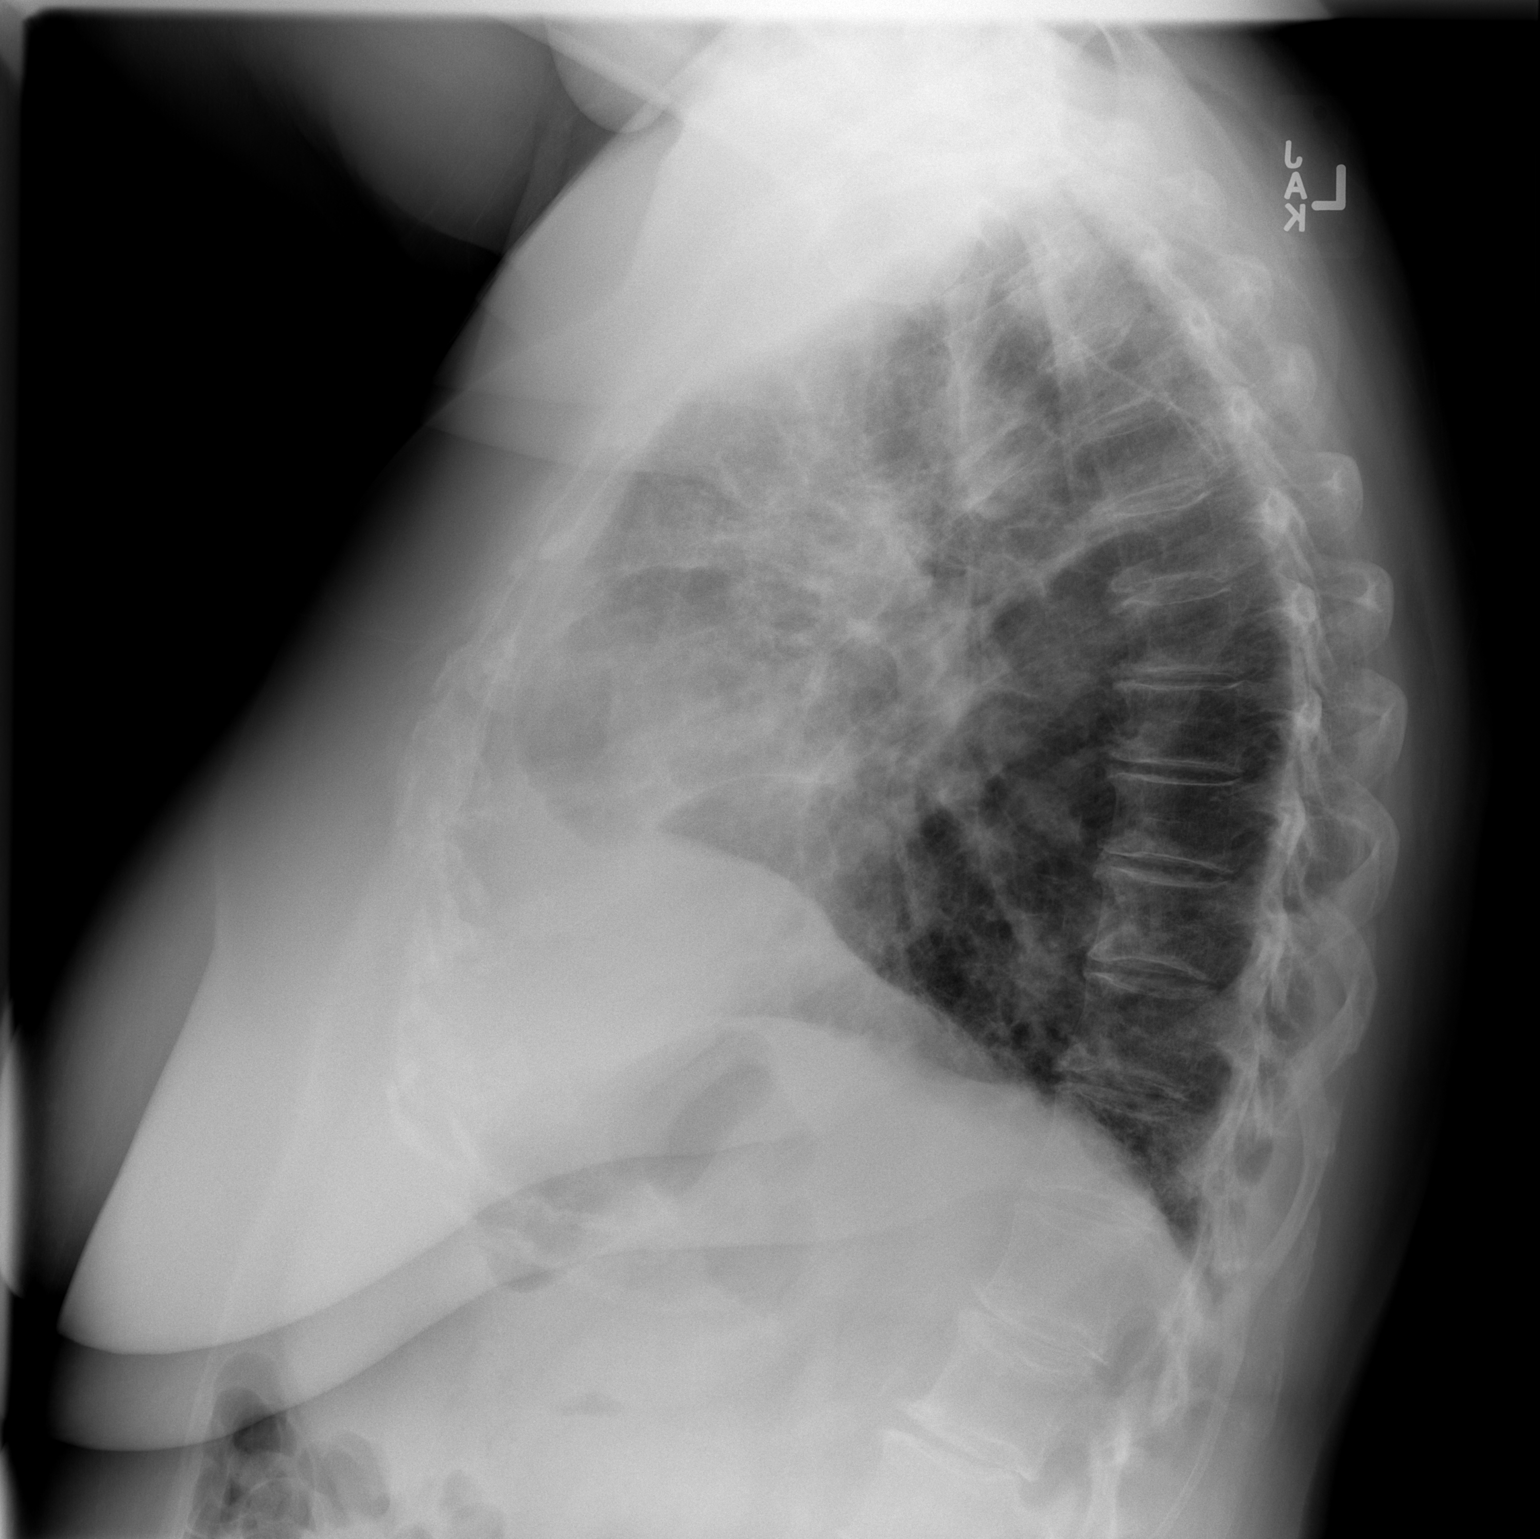

[2 of 2 positions shown; findings below may reference images not displayed]

FINDINGS: Mediastinum and hilar structures normal. Diffuse interstitial
prominence noted consistent with known interstitial fibrosis. No
significant interim change. Small right side pleural effusion. No
pneumothorax. Stable cardiomegaly. No pulmonary venous congestion.
Right chest wall subcutaneous emphysema has cleared.
IMPRESSION: 1. Interim clearing of right chest wall subcutaneous emphysema.
Small right pleural effusion.
2. Chronic interstitial lung disease.

## 2016-02-17 DIAGNOSIS — J849 Interstitial pulmonary disease, unspecified: Secondary | ICD-10-CM | POA: Diagnosis not present

## 2016-02-17 DIAGNOSIS — M1990 Unspecified osteoarthritis, unspecified site: Secondary | ICD-10-CM | POA: Diagnosis not present

## 2016-02-17 DIAGNOSIS — M2550 Pain in unspecified joint: Secondary | ICD-10-CM | POA: Diagnosis not present

## 2016-02-17 DIAGNOSIS — M150 Primary generalized (osteo)arthritis: Secondary | ICD-10-CM | POA: Diagnosis not present

## 2016-02-24 DIAGNOSIS — J45909 Unspecified asthma, uncomplicated: Secondary | ICD-10-CM | POA: Diagnosis not present

## 2016-02-24 DIAGNOSIS — J8410 Pulmonary fibrosis, unspecified: Secondary | ICD-10-CM | POA: Diagnosis not present

## 2016-02-24 DIAGNOSIS — R05 Cough: Secondary | ICD-10-CM | POA: Diagnosis not present

## 2016-03-01 DIAGNOSIS — H2513 Age-related nuclear cataract, bilateral: Secondary | ICD-10-CM | POA: Diagnosis not present

## 2016-03-01 DIAGNOSIS — H524 Presbyopia: Secondary | ICD-10-CM | POA: Diagnosis not present

## 2016-04-18 DIAGNOSIS — H25013 Cortical age-related cataract, bilateral: Secondary | ICD-10-CM | POA: Diagnosis not present

## 2016-04-18 DIAGNOSIS — H2513 Age-related nuclear cataract, bilateral: Secondary | ICD-10-CM | POA: Diagnosis not present

## 2016-04-18 DIAGNOSIS — H43813 Vitreous degeneration, bilateral: Secondary | ICD-10-CM | POA: Diagnosis not present

## 2016-04-20 ENCOUNTER — Encounter: Payer: Self-pay | Admitting: Pulmonary Disease

## 2016-04-20 ENCOUNTER — Ambulatory Visit (INDEPENDENT_AMBULATORY_CARE_PROVIDER_SITE_OTHER): Payer: Medicare Other | Admitting: Pulmonary Disease

## 2016-04-20 VITALS — BP 144/76 | HR 83 | Ht 67.0 in | Wt 228.0 lb

## 2016-04-20 DIAGNOSIS — J0111 Acute recurrent frontal sinusitis: Secondary | ICD-10-CM

## 2016-04-20 DIAGNOSIS — Z23 Encounter for immunization: Secondary | ICD-10-CM | POA: Diagnosis not present

## 2016-04-20 DIAGNOSIS — J301 Allergic rhinitis due to pollen: Secondary | ICD-10-CM | POA: Diagnosis not present

## 2016-04-20 DIAGNOSIS — J849 Interstitial pulmonary disease, unspecified: Secondary | ICD-10-CM | POA: Diagnosis not present

## 2016-04-20 DIAGNOSIS — J479 Bronchiectasis, uncomplicated: Secondary | ICD-10-CM

## 2016-04-20 NOTE — Assessment & Plan Note (Signed)
Flu shot today

## 2016-04-20 NOTE — Assessment & Plan Note (Signed)
As noted previously she is believed to have chronic hypersensitivity pneumonitis causing usual interstitial pneumonitis. Fortunately she's had very little progression of her disease since 2008.  Plan: As this is been a stable interval we will continue expectant management Pulmonary punch and testing in 6 minute walk next visit in 6 months Flu shot today

## 2016-04-20 NOTE — Patient Instructions (Signed)
For your allergic rhinitis I recommend the following: Go see [facility] [facility] allergy Use Neil Med rinses with distilled water at least twice per day using the instructions on the package. 1/2 hour after using the [facility] Med rinse, use Nasacort two puffs in each nostril once per day.  Remember that the Nasacort can take 1-2 weeks to work after regular use. Use generic zyrtec (cetirizine) every day.  If this doesn't help, then stop taking it and use chlorpheniramine-phenylephrine combination tablets.  We will arrange for lung function test on your next visit in 6 months, call me if you have any respiratory problems between now and then

## 2016-04-20 NOTE — Assessment & Plan Note (Signed)
I have recommended that she go see allergy to have further testing to evaluate this. However, because it's been more of a problem for her in the last few weeks I recommended that she start taking Claritin regularly as well as nasocort. We talked about the basic pharmacokinetics and appropriate use of nasocort today.

## 2016-04-20 NOTE — Progress Notes (Signed)
Subjective:    Patient ID: [name], female    DOB: 1940-07-22, 75 y.o.   MRN: [id number]  Synopsis: Former patient of Dr. 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 As summarized by Dr. Gwenette Greet: CT chest 2008:  Mild bronchiectasis, ?NSIP: CXR 05/2014:  Progressive IS changes compared to 2012. 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  HRCT 2016:  Changes c/w fibrotic NSIP?  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 The patient's lowest oxygen saturation was 90 %, highest heart rate was 118 bpm , and highest blood pressure was 180/80 March 2017 pulmonary function testing ratio 88%, FVC 2.03 L (84% predicted), total lung capacity 3.0 L (54% predicted), DLCO 14.7 (51% predicted)  HPI Chief Complaint  Patient presents with  . Follow-up    pt states she is doing well, at baseline with SOB, cough.      Adaira has not been to allergy clinic yet, thoguh she still notes allergy type symptoms.  She said that she was in a moldly environment and she had significant sinus congestion, runny nose and a scratchy throat.  She takes claritin.   She had some cough last week with the sinus congestion, but not much prior to that.  She has not had much dyspnea.  She has mild dyspnea but it is not progressing.   She has not had a flu shot yet.    Past Medical History:  Diagnosis Date  . Allergic rhinitis    takes Claritin nightly   . Anxiety    but not on any meds  . Arthritis   . Asthma   . Carpal tunnel syndrome   . GERD (gastroesophageal reflux disease)   . History of bronchitis 3-66yr ago  . History of colon polyps    benign  . Joint pain   . Peripheral edema    takes Lasix daily  . Shortness of breath dyspnea    with exertion;Albuterol inhaler as needed  . Thyroid nodule       Review of Systems  Constitutional: Negative for chills, fatigue and fever.  HENT: Negative for postnasal drip, rhinorrhea and sinus pressure.   Respiratory: Negative for cough, shortness of breath and wheezing.   Cardiovascular: Negative for chest pain, palpitations and leg swelling.       Objective:   Physical Exam Vitals:   04/20/16 1139  BP: (!) 144/76  Pulse: 83  SpO2: 95%  Weight: 228 lb (103.4 kg)  Height: _0  (1.702 m)  RA  Gen: well appearing HENT: OP clear, TM's clear, neck supple PULM: Crackles bases bilaterally, wheezing upper lobes CV: RRR, no mgr, trace edema GI: BS+, soft, nontender Derm: no cyanosis or rash Psyche: normal mood and affect  CBC    Component Value Date/Time   WBC 11.9 (H) 11/22/2014 0232   RBC 4.07 11/22/2014 0232   HGB 11.8 (L) 11/22/2014 0232   HCT 37.1 11/22/2014 0232   PLT 268  11/22/2014 0232   MCV 91.2 11/22/2014 0232   MCH 29.0 11/22/2014 0232   MCHC 31.8 11/22/2014 0232   RDW 14.6 11/22/2014 0232   MONOABS 0.8 (H) 02/06/2007 1043   EOSABS 0.4 02/06/2007 1043   BASOSABS 0.1 02/06/2007 1043         Assessment & Plan:  Interstitial lung disease ([place]) As noted previously she is believed to have chronic hypersensitivity pneumonitis causing usual interstitial pneumonitis. Fortunately she's had very little progression of her disease since 2008.  Plan: As this is been a stable interval we will continue expectant management Pulmonary punch and testing in 6 minute walk next visit in 6 months Flu shot today  Bronchiectasis without acute exacerbation Flu shot  today  Recurrent acute sinusitis I have recommended that she go see allergy to have further testing to evaluate this. However, because it's been more of a problem for her in the last few weeks I recommended that she start taking Claritin regularly as well as nasocort. We talked about the basic pharmacokinetics and appropriate use of nasocort today.    Current Outpatient Prescriptions:  .  albuterol (PROVENTIL HFA;VENTOLIN HFA) 108 (90 BASE) MCG/ACT inhaler, Inhale 2 puffs into the lungs every 6 (six) hours as needed for wheezing or shortness of breath. , Disp: , Rfl:  .  aspirin 325 MG tablet, Take 650 mg by mouth every 6 (six) hours as needed for mild pain or moderate pain. , Disp: , Rfl:  .  furosemide (LASIX) 20 MG tablet, Take 20 mg by mouth as needed for fluid. , Disp: , Rfl:  .  ibuprofen (ADVIL,MOTRIN) 200 MG tablet, Take 400 mg by mouth every 6 (six) hours as needed for mild pain or moderate pain. , Disp: , Rfl:  .  loratadine (CLARITIN) 5 MG chewable tablet, Chew 5 mg by mouth daily as needed for allergies., Disp: , Rfl:  .  Respiratory Therapy Supplies (FLUTTER) DEVI, Use as directed, Disp: 1 each, Rfl: 0

## 2016-04-21 ENCOUNTER — Telehealth: Payer: Self-pay | Admitting: Pulmonary Disease

## 2016-04-21 NOTE — Telephone Encounter (Signed)
Pt states she had her flu shot yesterday. Pt developed body aches, voiced hoarse, chills & prod cough with yellow mucus X1d. Pt denies any fever, redness, warmth or swelling around injection area.  CY please advise. Thanks.  Current Outpatient Prescriptions on File Prior to Visit  Medication Sig Dispense Refill  . albuterol (PROVENTIL HFA;VENTOLIN HFA) 108 (90 BASE) MCG/ACT inhaler Inhale 2 puffs into the lungs every 6 (six) hours as needed for wheezing or shortness of breath.     Marland Kitchen aspirin 325 MG tablet Take 650 mg by mouth every 6 (six) hours as needed for mild pain or moderate pain.     . furosemide (LASIX) 20 MG tablet Take 20 mg by mouth as needed for fluid.     Marland Kitchen ibuprofen (ADVIL,MOTRIN) 200 MG tablet Take 400 mg by mouth every 6 (six) hours as needed for mild pain or moderate pain.     Marland Kitchen loratadine (CLARITIN) 5 MG chewable tablet Chew 5 mg by mouth daily as needed for allergies.    Marland Kitchen Respiratory Therapy Supplies (FLUTTER) DEVI Use as directed 1 each 0   No current facility-administered medications on file prior to visit.    Allergies  Allergen Reactions  . Adhesive [Tape]     Rash  . Sulfonamide Derivatives     Unknown

## 2016-04-21 NOTE — Telephone Encounter (Signed)
Called and spoke with pt and she is aware of CY recs.  Nothing further is needed.

## 2016-04-21 NOTE — Telephone Encounter (Signed)
This should pass in a day or two.  Suggest tylenol, extra fluids, otc cough syrup as needed

## 2016-05-03 DIAGNOSIS — J988 Other specified respiratory disorders: Secondary | ICD-10-CM | POA: Diagnosis not present

## 2016-05-06 DIAGNOSIS — J679 Hypersensitivity pneumonitis due to unspecified organic dust: Secondary | ICD-10-CM

## 2016-05-06 HISTORY — DX: Hypersensitivity pneumonitis due to unspecified organic dust: J67.9

## 2016-05-12 DIAGNOSIS — E559 Vitamin D deficiency, unspecified: Secondary | ICD-10-CM | POA: Diagnosis not present

## 2016-05-12 DIAGNOSIS — Z6835 Body mass index (BMI) 35.0-35.9, adult: Secondary | ICD-10-CM | POA: Diagnosis not present

## 2016-05-12 DIAGNOSIS — E7800 Pure hypercholesterolemia, unspecified: Secondary | ICD-10-CM | POA: Diagnosis not present

## 2016-05-12 DIAGNOSIS — Z131 Encounter for screening for diabetes mellitus: Secondary | ICD-10-CM | POA: Diagnosis not present

## 2016-05-12 DIAGNOSIS — E669 Obesity, unspecified: Secondary | ICD-10-CM | POA: Diagnosis not present

## 2016-05-12 DIAGNOSIS — Z0000 Encounter for general adult medical examination without abnormal findings: Secondary | ICD-10-CM | POA: Diagnosis not present

## 2016-05-12 DIAGNOSIS — R233 Spontaneous ecchymoses: Secondary | ICD-10-CM | POA: Diagnosis not present

## 2016-05-21 DIAGNOSIS — G500 Trigeminal neuralgia: Secondary | ICD-10-CM | POA: Diagnosis not present

## 2016-05-21 DIAGNOSIS — J3489 Other specified disorders of nose and nasal sinuses: Secondary | ICD-10-CM | POA: Diagnosis not present

## 2016-05-22 DIAGNOSIS — B029 Zoster without complications: Secondary | ICD-10-CM | POA: Diagnosis not present

## 2016-05-22 DIAGNOSIS — G500 Trigeminal neuralgia: Secondary | ICD-10-CM | POA: Diagnosis not present

## 2016-05-23 DIAGNOSIS — B0230 Zoster ocular disease, unspecified: Secondary | ICD-10-CM | POA: Diagnosis not present

## 2016-06-03 DIAGNOSIS — L853 Xerosis cutis: Secondary | ICD-10-CM | POA: Diagnosis not present

## 2016-06-03 DIAGNOSIS — D1801 Hemangioma of skin and subcutaneous tissue: Secondary | ICD-10-CM | POA: Diagnosis not present

## 2016-06-03 DIAGNOSIS — L821 Other seborrheic keratosis: Secondary | ICD-10-CM | POA: Diagnosis not present

## 2016-06-03 DIAGNOSIS — L814 Other melanin hyperpigmentation: Secondary | ICD-10-CM | POA: Diagnosis not present

## 2016-06-03 DIAGNOSIS — B029 Zoster without complications: Secondary | ICD-10-CM | POA: Diagnosis not present

## 2016-06-03 DIAGNOSIS — L820 Inflamed seborrheic keratosis: Secondary | ICD-10-CM | POA: Diagnosis not present

## 2016-06-09 DIAGNOSIS — J45909 Unspecified asthma, uncomplicated: Secondary | ICD-10-CM | POA: Diagnosis not present

## 2016-06-09 DIAGNOSIS — E7800 Pure hypercholesterolemia, unspecified: Secondary | ICD-10-CM | POA: Diagnosis not present

## 2016-06-09 DIAGNOSIS — E559 Vitamin D deficiency, unspecified: Secondary | ICD-10-CM | POA: Diagnosis not present

## 2016-06-09 DIAGNOSIS — J8410 Pulmonary fibrosis, unspecified: Secondary | ICD-10-CM | POA: Diagnosis not present

## 2016-06-13 ENCOUNTER — Encounter (HOSPITAL_COMMUNITY): Payer: Self-pay

## 2016-06-13 ENCOUNTER — Inpatient Hospital Stay (HOSPITAL_COMMUNITY)
Admission: EM | Admit: 2016-06-13 | Discharge: 2016-06-19 | DRG: 189 | Disposition: A | Discharge status: Home / Self Care | Payer: Medicare Other | Attending: Internal Medicine | Admitting: Internal Medicine

## 2016-06-13 DIAGNOSIS — IMO0001 Reserved for inherently not codable concepts without codable children: Secondary | ICD-10-CM | POA: Diagnosis present

## 2016-06-13 DIAGNOSIS — J8410 Pulmonary fibrosis, unspecified: Secondary | ICD-10-CM | POA: Diagnosis not present

## 2016-06-13 DIAGNOSIS — J209 Acute bronchitis, unspecified: Secondary | ICD-10-CM | POA: Diagnosis present

## 2016-06-13 DIAGNOSIS — J8489 Other specified interstitial pulmonary diseases: Secondary | ICD-10-CM | POA: Diagnosis present

## 2016-06-13 DIAGNOSIS — J679 Hypersensitivity pneumonitis due to unspecified organic dust: Secondary | ICD-10-CM | POA: Diagnosis not present

## 2016-06-13 DIAGNOSIS — J309 Allergic rhinitis, unspecified: Secondary | ICD-10-CM | POA: Diagnosis present

## 2016-06-13 DIAGNOSIS — J9621 Acute and chronic respiratory failure with hypoxia: Secondary | ICD-10-CM

## 2016-06-13 DIAGNOSIS — J849 Interstitial pulmonary disease, unspecified: Secondary | ICD-10-CM | POA: Diagnosis present

## 2016-06-13 DIAGNOSIS — R0609 Other forms of dyspnea: Secondary | ICD-10-CM | POA: Diagnosis not present

## 2016-06-13 DIAGNOSIS — K219 Gastro-esophageal reflux disease without esophagitis: Secondary | ICD-10-CM | POA: Diagnosis present

## 2016-06-13 DIAGNOSIS — R0902 Hypoxemia: Secondary | ICD-10-CM | POA: Diagnosis not present

## 2016-06-13 DIAGNOSIS — Z96652 Presence of left artificial knee joint: Secondary | ICD-10-CM | POA: Diagnosis present

## 2016-06-13 DIAGNOSIS — E785 Hyperlipidemia, unspecified: Secondary | ICD-10-CM | POA: Diagnosis present

## 2016-06-13 DIAGNOSIS — R000 Tachycardia, unspecified: Secondary | ICD-10-CM | POA: Diagnosis not present

## 2016-06-13 DIAGNOSIS — Z86010 Personal history of colonic polyps: Secondary | ICD-10-CM

## 2016-06-13 DIAGNOSIS — I272 Pulmonary hypertension, unspecified: Secondary | ICD-10-CM | POA: Diagnosis not present

## 2016-06-13 DIAGNOSIS — Z7982 Long term (current) use of aspirin: Secondary | ICD-10-CM

## 2016-06-13 DIAGNOSIS — M069 Rheumatoid arthritis, unspecified: Secondary | ICD-10-CM | POA: Diagnosis present

## 2016-06-13 DIAGNOSIS — Z91048 Other nonmedicinal substance allergy status: Secondary | ICD-10-CM

## 2016-06-13 DIAGNOSIS — Z79899 Other long term (current) drug therapy: Secondary | ICD-10-CM

## 2016-06-13 DIAGNOSIS — I10 Essential (primary) hypertension: Secondary | ICD-10-CM | POA: Diagnosis present

## 2016-06-13 DIAGNOSIS — Z882 Allergy status to sulfonamides status: Secondary | ICD-10-CM

## 2016-06-13 HISTORY — DX: Acute and chronic respiratory failure with hypoxia: J96.21

## 2016-06-13 LAB — CBC
HCT: 42.5 % (ref 36.0–46.0)
Hemoglobin: 14.2 g/dL (ref 12.0–15.0)
MCH: 29.8 pg (ref 26.0–34.0)
MCHC: 33.4 g/dL (ref 30.0–36.0)
MCV: 89.3 fL (ref 78.0–100.0)
Platelets: 437 10*3/uL — ABNORMAL HIGH (ref 150–400)
RBC: 4.76 MIL/uL (ref 3.87–5.11)
RDW: 15 % (ref 11.5–15.5)
WBC: 10.8 10*3/uL — ABNORMAL HIGH (ref 4.0–10.5)

## 2016-06-13 LAB — BASIC METABOLIC PANEL
Anion gap: 10 (ref 5–15)
BUN: 7 mg/dL (ref 6–20)
CO2: 26 mmol/L (ref 22–32)
Calcium: 9.3 mg/dL (ref 8.9–10.3)
Chloride: 104 mmol/L (ref 101–111)
Creatinine, Ser: 0.6 mg/dL (ref 0.44–1.00)
GFR calc Af Amer: >60 mL/min (ref >60)
GFR calc non Af Amer: >60 mL/min (ref >60)
Glucose, Bld: 104 mg/dL — ABNORMAL HIGH (ref 65–99)
Potassium: 3.7 mmol/L (ref 3.5–5.1)
Sodium: 140 mmol/L (ref 135–145)

## 2016-06-13 LAB — I-STAT TROPONIN, ED: Troponin i, poc: 0.01 ng/mL (ref 0.00–0.08)

## 2016-06-13 MED ORDER — ZOLPIDEM TARTRATE 5 MG PO TABS
5.0000 mg | ORAL_TABLET | Freq: Every evening | ORAL | Status: DC | PRN
  Administered 2016-06-14 – 2016-06-18 (×6): 5 mg via ORAL
  Filled 2016-06-13 (×6): qty 1

## 2016-06-13 MED ORDER — AZITHROMYCIN 250 MG PO TABS: 500.0000 mg | ORAL_TABLET | Freq: Once | ORAL | Status: DC

## 2016-06-13 MED ORDER — DEXTROSE 5 % IV SOLN
1.0000 g | Freq: Once | INTRAVENOUS | Status: AC
  Administered 2016-06-14: 1 g via INTRAVENOUS
  Filled 2016-06-13: qty 10

## 2016-06-13 MED ORDER — ENOXAPARIN SODIUM 40 MG/0.4ML ~~LOC~~ SOLN
40.0000 mg | SUBCUTANEOUS | Status: DC
  Administered 2016-06-14 – 2016-06-19 (×6): 40 mg via SUBCUTANEOUS
  Filled 2016-06-13 (×7): qty 0.4

## 2016-06-13 MED ORDER — LEVALBUTEROL HCL 1.25 MG/0.5ML IN NEBU
1.2500 mg | INHALATION_SOLUTION | Freq: Four times a day (QID) | RESPIRATORY_TRACT | Status: DC
  Administered 2016-06-14: 1.25 mg via RESPIRATORY_TRACT
  Filled 2016-06-13 (×2): qty 0.5

## 2016-06-13 MED ORDER — IPRATROPIUM BROMIDE 0.02 % IN SOLN
0.5000 mg | Freq: Once | RESPIRATORY_TRACT | Status: AC
  Administered 2016-06-13: 0.5 mg via RESPIRATORY_TRACT
  Filled 2016-06-13: qty 2.5

## 2016-06-13 MED ORDER — METHYLPREDNISOLONE SODIUM SUCC 125 MG IJ SOLR
60.0000 mg | Freq: Three times a day (TID) | INTRAMUSCULAR | Status: DC
  Administered 2016-06-14 – 2016-06-16 (×7): 60 mg via INTRAVENOUS
  Filled 2016-06-13 (×7): qty 2

## 2016-06-13 MED ORDER — ASPIRIN 325 MG PO TABS: 650.0000 mg | ORAL_TABLET | Freq: Four times a day (QID) | ORAL | Status: DC | PRN

## 2016-06-13 MED ORDER — IPRATROPIUM BROMIDE 0.02 % IN SOLN
0.5000 mg | RESPIRATORY_TRACT | Status: DC | PRN
  Administered 2016-06-18: 0.5 mg via RESPIRATORY_TRACT
  Filled 2016-06-13: qty 2.5

## 2016-06-13 MED ORDER — ALBUTEROL SULFATE (2.5 MG/3ML) 0.083% IN NEBU
5.0000 mg | INHALATION_SOLUTION | RESPIRATORY_TRACT | Status: DC | PRN
Start: 2016-06-13 — End: 2016-06-13
  Administered 2016-06-13: 5 mg via RESPIRATORY_TRACT
  Filled 2016-06-13 (×2): qty 6

## 2016-06-13 MED ORDER — METHYLPREDNISOLONE SODIUM SUCC 125 MG IJ SOLR: 60.0000 mg | Freq: Three times a day (TID) | INTRAMUSCULAR | Status: DC

## 2016-06-13 MED ORDER — ACETAMINOPHEN 325 MG PO TABS: 650.0000 mg | ORAL_TABLET | Freq: Four times a day (QID) | ORAL | Status: DC | PRN

## 2016-06-13 MED ORDER — SODIUM CHLORIDE 0.9 % IV SOLN
INTRAVENOUS | Status: DC
  Administered 2016-06-13 – 2016-06-15 (×2): via INTRAVENOUS

## 2016-06-13 MED ORDER — DEXTROSE 5 % IV SOLN
1.0000 g | INTRAVENOUS | Status: DC
  Administered 2016-06-14 – 2016-06-18 (×5): 1 g via INTRAVENOUS
  Filled 2016-06-13 (×5): qty 10

## 2016-06-13 MED ORDER — LORATADINE 10 MG PO TABS: 5.0000 mg | ORAL_TABLET | Freq: Every day | ORAL | Status: DC | PRN

## 2016-06-13 MED ORDER — METHYLPREDNISOLONE SODIUM SUCC 125 MG IJ SOLR
125.0000 mg | Freq: Once | INTRAMUSCULAR | Status: AC
  Administered 2016-06-13: 125 mg via INTRAVENOUS
  Filled 2016-06-13: qty 2

## 2016-06-13 MED ORDER — DM-GUAIFENESIN ER 30-600 MG PO TB12
1.0000 | ORAL_TABLET | Freq: Two times a day (BID) | ORAL | Status: DC
  Administered 2016-06-14 – 2016-06-19 (×9): 1 via ORAL
  Filled 2016-06-13 (×11): qty 1

## 2016-06-13 MED ORDER — IBUPROFEN 200 MG PO TABS: 400.0000 mg | ORAL_TABLET | Freq: Four times a day (QID) | ORAL | Status: DC | PRN

## 2016-06-13 MED ORDER — ONDANSETRON HCL 4 MG/2ML IJ SOLN: 4.0000 mg | Freq: Three times a day (TID) | INTRAMUSCULAR | Status: DC | PRN

## 2016-06-13 MED ORDER — HYDRALAZINE HCL 20 MG/ML IJ SOLN: 5.0000 mg | INTRAMUSCULAR | Status: DC | PRN

## 2016-06-13 MED ORDER — DOXYCYCLINE HYCLATE 100 MG PO TABS
100.0000 mg | ORAL_TABLET | Freq: Two times a day (BID) | ORAL | Status: DC
  Administered 2016-06-14 – 2016-06-19 (×12): 100 mg via ORAL
  Filled 2016-06-13 (×12): qty 1

## 2016-06-13 NOTE — ED Notes (Signed)
Pt reports she had chest xray done today at [facility]. She has disc with her.

## 2016-06-13 NOTE — ED Provider Notes (Signed)
[facility] DEPT Provider Note   CSN: [id number] Arrival date & time: 06/13/16  1327     History   Chief Complaint Chief Complaint  Patient presents with  . Shortness of Breath    HPI [name] is a 76 y.o. female.  HPI Patient presents to emergency room with complaints of cough and shortness of breath. She has a history of pulmonary fibrosis and interstitial pneumonitis. Patient states she started having trouble with cough and congestion within the last week. She was seen on January 4 at her primary doctor's and started on azithromycin. Patient has been trying her nebulizer treatments and it seems to help temporarily but her breathing has been progressively getting worse. She gets more short of breath with exertion. Patient was also diagnosed with shingles last month. She's had some increasing discomfort over the last several days. She has not noticed any active lesions or blisters. She denies any chest pain. She denies any fever. Past Medical History:  Diagnosis Date  . Allergic rhinitis    takes Claritin nightly  . Anxiety    but not on any meds  . Arthritis   . Asthma   . Carpal tunnel syndrome   . GERD (gastroesophageal reflux disease)   . History of bronchitis 3-26yr ago  . History of colon polyps    benign  . Joint pain   . Peripheral edema    takes Lasix daily  . Shortness of breath dyspnea    with exertion;Albuterol inhaler as needed  . Thyroid nodule     Patient Active Problem List   Diagnosis Date Noted  . Pulmonary fibrosis ([facility] 06/13/2016  . Acute on chronic respiratory failure with hypoxia ([facility] 06/13/2016  . Hypersensitivity pneumonitis ([facility] 06/13/2016  . Rheumatoid arthritis ([facility] 06/12/2015  . Acute bronchitis 12/16/2014  . Interstitial fibrosis ([facility] 11/20/2014  . Bronchiectasis without acute exacerbation ([facility] 06/30/2014  . Recurrent acute sinusitis 06/17/2014  . Interstitial lung disease ([facility] 06/17/2014  . Obese 08/14/2013  . S/P  left TKA 08/13/2013  . Obesity 12/17/2010  . HTN (hypertension) 12/17/2010  . LOW BACK PAIN SYNDROME 07/09/2007  . DYSPNEA 07/09/2007  . HYPERLIPIDEMIA 04/03/2007  . GERD 04/03/2007    Past Surgical History:  Procedure Laterality Date  . BREAST CYST EXCISION Left 50 yrs ago  . CARPELL TUNNELL SURG Right   . COLONOSCOPY    . ESOPHAGOGASTRODUODENOSCOPY    . KNEE ARTHROSCOPY W/ MENISCAL REPAIR     left  . LUNG BIOPSY Right 11/20/2014   Procedure: LUNG BIOPSY;  Surgeon: SMelrose Nakayama MD;  [facility]  Service: Thoracic;  Laterality: Right;  . TOTAL KNEE ARTHROPLASTY Left 08/13/2013   Procedure: LEFT TOTAL KNEE ARTHROPLASTY;  Surgeon: MMauri Pole MD;  [facility];  Service: Orthopedics;  Laterality: Left;  . UMBILICAL HERNIA REPAIR    . VIDEO ASSISTED THORACOSCOPY Right 11/20/2014   Procedure: RIGHT  VIDEO ASSISTED THORACOSCOPY WITH WEDGE LUNG BIOPSIES RIGHT UPPER,MIDDLE AND LOWER LOBES ,PLACEMENT OF ON Q PAIN PUMP;  Surgeon: SMelrose Nakayama MD;  [facility]  Service: Thoracic;  Laterality: Right;    OB History    No data available       Home Medications    Prior to Admission medications   Medication Sig Start Date End Date Taking? Authorizing Provider  albuterol (PROVENTIL HFA;VENTOLIN HFA) 108 (90 BASE) MCG/ACT inhaler Inhale 2 puffs into the lungs every 6 (six) hours as needed for wheezing or shortness of breath.  Yes Historical Provider, MD  aspirin 325 MG tablet Take 650 mg by mouth every 6 (six) hours as needed for mild pain or moderate pain.    Yes Historical Provider, MD  azithromycin (ZITHROMAX) 250 MG tablet Take 250 mg by mouth daily.   Yes Historical Provider, MD  furosemide (LASIX) 20 MG tablet Take 20 mg by mouth as needed for fluid.    Yes Historical Provider, MD  ibuprofen (ADVIL,MOTRIN) 200 MG tablet Take 400 mg by mouth every 6 (six) hours as needed for mild pain or moderate pain.    Yes Historical Provider, MD  loratadine  (CLARITIN) 5 MG chewable tablet Chew 5 mg by mouth daily as needed for allergies.   Yes Historical Provider, MD    Family History Family History  Problem Relation Age of Onset  . Tuberculosis Father   . Tuberculosis Paternal Uncle     Social History Social History  Substance Use Topics  . Smoking status: Never Smoker  . Smokeless tobacco: Never Used  . Alcohol use No     Allergies   Adhesive [tape] and Sulfonamide derivatives   Review of Systems Review of Systems  All other systems reviewed and are negative.    Physical Exam Updated Vital Signs BP (!) 154/57 (BP Location: Right Arm)   Pulse 94   Temp 98.1 F (36.7 C) (Oral)   Resp 20   Ht _0  (1.702 m)   Wt 99.8 kg   SpO2 94%   BMI 34.46 kg/m   Physical Exam  Constitutional: No distress.  HENT:  Head: Normocephalic and atraumatic.  Right Ear: External ear normal.  Left Ear: External ear normal.  Eyes: Conjunctivae are normal. Right eye exhibits no discharge. Left eye exhibits no discharge. No scleral icterus.  Neck: Neck supple. No tracheal deviation present.  Cardiovascular: Normal rate, regular rhythm and intact distal pulses.   Pulmonary/Chest: Effort normal. No stridor. No respiratory distress. She has no wheezes. She has rales (diffuse crackles).  Abdominal: Soft. Bowel sounds are normal. She exhibits no distension. There is no tenderness. There is no rebound and no guarding.  Musculoskeletal: She exhibits no edema or tenderness.  Neurological: She is alert. She has normal strength. No cranial nerve deficit (no facial droop, extraocular movements intact, no slurred speech) or sensory deficit. She exhibits normal muscle tone. She displays no seizure activity. Coordination normal.  Skin: Skin is warm and dry. No rash noted. She is not diaphoretic.  Psychiatric: She has a normal mood and affect.  Nursing note and vitals reviewed.    ED Treatments / Results  Labs (all labs ordered are listed, but only  abnormal results are displayed) Labs Reviewed  BASIC METABOLIC PANEL - Abnormal; Notable for the following:       Result Value   Glucose, Bld 104 (*)    All other components within normal limits  CBC - Abnormal; Notable for the following:    WBC 10.8 (*)    Platelets 437 (*)    All other components within normal limits  RESPIRATORY PANEL BY PCR  CULTURE, BLOOD (ROUTINE X 2)  CULTURE, BLOOD (ROUTINE X 2)  CULTURE, EXPECTORATED SPUTUM-ASSESSMENT  GRAM STAIN  INFLUENZA PANEL BY PCR (TYPE A & B, H1N1)  LACTIC ACID, PLASMA  STREP PNEUMONIAE URINARY ANTIGEN  I-STAT TROPOININ, ED    EKG  EKG Interpretation  Date/Time:  Monday June 13 2016 13:39:23 EST Ventricular Rate:  106 PR Interval:  146 QRS Duration: 92 QT Interval:  352  QTC Calculation: 467 R Axis:   26 Text Interpretation:  Sinus tachycardia Nonspecific ST abnormality Abnormal ECG No significant change since last tracing Confirmed by Harlie Buening  MD-J, Jenny Lai ([id number]) on 06/13/2016 8:26:34 PM       Radiology Patient brought an x-ray with her from [facility]. I reviewed the films and compared them to try her images that we have on our PACS.  Patient has findings consistent with pulmonary fibrosis. Technique is poor on the current x-ray but I doubt any acute infiltrate. The images appears similar to the previous films.  Procedures Procedures (including critical care time)  Medications Ordered in ED Medications  0.9 %  sodium chloride infusion ( Intravenous Restarted 06/14/16 1242)  ipratropium (ATROVENT) nebulizer solution 0.5 mg (not administered)  methylPREDNISolone sodium succinate (SOLU-MEDROL) 125 mg/2 mL injection 60 mg (60 mg Intravenous Given 06/14/16 2151)  loratadine (CLARITIN) tablet 5 mg (not administered)  ibuprofen (ADVIL,MOTRIN) tablet 400 mg (not administered)  hydrALAZINE (APRESOLINE) injection 5 mg (not administered)  dextromethorphan-guaiFENesin (MUCINEX DM) 30-600 MG per 12 hr tablet 1 tablet (1 tablet Oral  Given 06/14/16 2151)  doxycycline (VIBRA-TABS) tablet 100 mg (100 mg Oral Given 06/14/16 2151)  cefTRIAXone (ROCEPHIN) 1 g in dextrose 5 % 50 mL IVPB (1 g Intravenous Given 06/14/16 2151)  enoxaparin (LOVENOX) injection 40 mg (40 mg Subcutaneous Given 06/14/16 1134)  ondansetron (ZOFRAN) injection 4 mg (not administered)  acetaminophen (TYLENOL) tablet 650 mg (not administered)  zolpidem (AMBIEN) tablet 5 mg (5 mg Oral Given 06/14/16 2151)  levalbuterol (XOPENEX) nebulizer solution 1.25 mg (1.25 mg Nebulization Given 06/14/16 2139)  MEDLINE mouth rinse (15 mLs Mouth Rinse Not Given 06/14/16 2200)  methylPREDNISolone sodium succinate (SOLU-MEDROL) 125 mg/2 mL injection 125 mg (125 mg Intravenous Given 06/13/16 2114)  ipratropium (ATROVENT) nebulizer solution 0.5 mg (0.5 mg Nebulization Given 06/13/16 2118)  cefTRIAXone (ROCEPHIN) 1 g in dextrose 5 % 50 mL IVPB (0 g Intravenous Stopped 06/14/16 0108)     Initial Impression / Assessment and Plan / ED Course  I have reviewed the triage vital signs and the nursing notes.  Pertinent labs & imaging results that were available during my care of the patient were reviewed by me and considered in my medical decision making (see chart for details).  Clinical Course     Pt presents with worsening cough, congestion.  Sx likely related to infection on top of her pulmonary fibrosis.  She does not have any active lesions on her face.  I doubt acute zoster flare.  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  At rest, oxygen was stable.  WHen patient walked her oxygen sat dropped to significantly.   Pt is not currently on home o2.  Admitted for further treatment.  Final Clinical Impressions(s) / ED Diagnoses   Final diagnoses:  Pulmonary fibrosis ([facility])  Hypoxia      Dorie Rank, MD 06/14/16 2201

## 2016-06-13 NOTE — H&P (Signed)
History and Physical    [name] CWC:[id number] DOB: [birth year] DOA: 06/13/2016  Referring MD/NP/PA:   PCP: Melinda Crutch, MD   Patient coming from:  The patient is coming from home.  At baseline, pt is independent for most of ADL.    Chief Complaint: cough and SOB  HPI: [name] is a 76 y.o. female with medical history significant of pulmonary fibrosis, hypersensitivity pneumonitis, peripheral edema, hypertension, GERD, anxiety, rheumatoid arthritis, who presents with cough and SOB  Patient states that she has been having shortness of breath and cough in the past 4 days, which has been progressively getting worse. Patient coughs up clear mucus, no chest pain, fever, but has chills. Patient was seen by PCP on Thursday and given prescription for azithromycin and nebulizers, without significant help. Her symptoms actually have been worsening. Patient denies chest pain, runny nose or sore throat. Patient was seen in [facility] today, and had chest x-ray which was negative for any infiltration. Pt states that she  was also diagnosed with shingles last month. She has not noticed any active lesions or blisters. Patient does not have nausea, vomiting, diarrhea, abdominal pain. No symptoms of UTI or unilateral weakness.  ED Course: pt was found to have WBC 10.8, negative troponin, electrolytes and renal function okay, temperature normal, tachycardia, tachypnea, oxygen saturation to 86% on room air, which improved to 94% on 2 L oxygen. Patient is placed on telemetry bed for observation.  Review of Systems:   General: no fevers, has chills, no changes in body weight, has fatigue HEENT: no blurry vision, hearing changes or sore throat Respiratory: has dyspnea, coughing, no wheezing CV: no chest pain, no palpitations GI: no nausea, vomiting, abdominal pain, diarrhea, constipation GU: no dysuria, burning on urination, increased urinary frequency, hematuria  Ext: no leg edema Neuro:  no unilateral weakness, numbness, or tingling, no vision change or hearing loss Skin: no rash, no skin tear. MSK: No muscle spasm, no deformity, no limitation of range of movement in spin Heme: No easy bruising.  Travel history: No recent long distant travel.  Allergy:  Allergies  Allergen Reactions  . Adhesive [Tape] Itching    Rash  . Sulfonamide Derivatives Other (See Comments)    Unknown    Past Medical History:  Diagnosis Date  . Allergic rhinitis    takes Claritin nightly  . Anxiety    but not on any meds  . Arthritis   . Asthma   . Carpal tunnel syndrome   . GERD (gastroesophageal reflux disease)   . History of bronchitis 3-27yr ago  . History of colon polyps    benign  . Joint pain   . Peripheral edema    takes Lasix daily  . Shortness of breath dyspnea    with exertion;Albuterol inhaler as needed  . Thyroid nodule     Past Surgical History:  Procedure Laterality Date  . BREAST CYST EXCISION Left 50 yrs ago  . CARPELL TUNNELL SURG Right   . COLONOSCOPY    . ESOPHAGOGASTRODUODENOSCOPY    . KNEE ARTHROSCOPY W/ MENISCAL REPAIR     left  . LUNG BIOPSY Right 11/20/2014   Procedure: LUNG BIOPSY;  Surgeon: SMelrose Nakayama MD;  [facility]  Service: Thoracic;  Laterality: Right;  . TOTAL KNEE ARTHROPLASTY Left 08/13/2013   Procedure: LEFT TOTAL KNEE ARTHROPLASTY;  Surgeon: MMauri Pole MD;  Location[facility];  Service: Orthopedics;  Laterality: Left;  . UMBILICAL HERNIA REPAIR    .  VIDEO ASSISTED THORACOSCOPY Right 11/20/2014   Procedure: RIGHT  VIDEO ASSISTED THORACOSCOPY WITH WEDGE LUNG BIOPSIES RIGHT UPPER,MIDDLE AND LOWER LOBES ,PLACEMENT OF ON Q PAIN PUMP;  Surgeon: Melrose Nakayama, MD;  Location: [facility];  Service: Thoracic;  Laterality: Right;    Social History:  reports that she has never smoked. She has never used smokeless tobacco. She reports that she does not drink alcohol or use drugs.  Family History:  Family History  Problem  Relation Age of Onset  . Tuberculosis Father   . Tuberculosis Paternal Uncle      Prior to Admission medications   Medication Sig Start Date End Date Taking? Authorizing Provider  albuterol (PROVENTIL HFA;VENTOLIN HFA) 108 (90 BASE) MCG/ACT inhaler Inhale 2 puffs into the lungs every 6 (six) hours as needed for wheezing or shortness of breath.    Yes Historical Provider, MD  aspirin 325 MG tablet Take 650 mg by mouth every 6 (six) hours as needed for mild pain or moderate pain.    Yes Historical Provider, MD  azithromycin (ZITHROMAX) 250 MG tablet Take 250 mg by mouth daily.   Yes Historical Provider, MD  furosemide (LASIX) 20 MG tablet Take 20 mg by mouth as needed for fluid.    Yes Historical Provider, MD  ibuprofen (ADVIL,MOTRIN) 200 MG tablet Take 400 mg by mouth every 6 (six) hours as needed for mild pain or moderate pain.    Yes Historical Provider, MD  loratadine (CLARITIN) 5 MG chewable tablet Chew 5 mg by mouth daily as needed for allergies.   Yes Historical Provider, MD    Physical Exam: Vitals:   06/13/16 1348 06/13/16 1956 06/13/16 2116  BP: 144/68 165/68   Pulse: 100 91   Resp: 25 20   Temp: 97.9 F (36.6 C) 98.4 F (36.9 C)   TempSrc: Oral Oral   SpO2: 98% 94% 94%  Weight:   98 kg (216 lb)  Height:   _0  (1.702 m)   General: Not in acute distress HEENT:       Eyes: PERRL, EOMI, no scleral icterus.       ENT: No discharge from the ears and nose, no pharynx injection, no tonsillar enlargement.        Neck: No JVD, no bruit, no mass felt. Heme: No neck lymph node enlargement. Cardiac: S1/S2, RRR, No murmurs, No gallops or rubs. Respiratory: has rhonchi and squeaks bilaterally. No rubs. GI: Soft, nondistended, nontender, no rebound pain, no organomegaly, BS present. GU: No hematuria Ext: No pitting leg edema bilaterally. 2+DP/PT pulse bilaterally. Musculoskeletal: No joint deformities, No joint redness or warmth, no limitation of ROM in spin. Skin: No rashes.    Neuro: Alert, oriented X3, cranial nerves II-XII grossly intact, moves all extremities normally. Psych: Patient is not psychotic, no suicidal or hemocidal ideation.  Labs on Admission: I have personally reviewed following labs and imaging studies  CBC:  Recent Labs Lab 06/13/16 1349  WBC 10.8*  HGB 14.2  HCT 42.5  MCV 89.3  PLT 664*   Basic Metabolic Panel:  Recent Labs Lab 06/13/16 1349  NA 140  K 3.7  CL 104  CO2 26  GLUCOSE 104*  BUN 7  CREATININE 0.60  CALCIUM 9.3   GFR: Estimated Creatinine Clearance: 72 mL/min (by C-G formula based on SCr of 0.6 mg/dL). Liver Function Tests: No results for input(s): AST, ALT, ALKPHOS, BILITOT, PROT, ALBUMIN in the last 168 hours. No results for input(s): LIPASE, AMYLASE in the last 168  hours. No results for input(s): AMMONIA in the last 168 hours. Coagulation Profile: No results for input(s): INR, PROTIME in the last 168 hours. Cardiac Enzymes: No results for input(s): CKTOTAL, CKMB, CKMBINDEX, TROPONINI in the last 168 hours. BNP (last 3 results) No results for input(s): PROBNP in the last 8760 hours. HbA1C: No results for input(s): HGBA1C in the last 72 hours. CBG: No results for input(s): GLUCAP in the last 168 hours. Lipid Profile: No results for input(s): CHOL, HDL, LDLCALC, TRIG, CHOLHDL, LDLDIRECT in the last 72 hours. Thyroid Function Tests: No results for input(s): TSH, T4TOTAL, FREET4, T3FREE, THYROIDAB in the last 72 hours. Anemia Panel: No results for input(s): VITAMINB12, FOLATE, FERRITIN, TIBC, IRON, RETICCTPCT in the last 72 hours. Urine analysis:    Component Value Date/Time   COLORURINE YELLOW 11/18/2014 [place] 11/18/2014 1257   LABSPEC 1.008 11/18/2014 1257   PHURINE 6.5 11/18/2014 1257   GLUCOSEU NEGATIVE 11/18/2014 1257   GLUCOSEU NEGATIVE 07/09/2007 0000   HGBUR NEGATIVE 11/18/2014 1257   BILIRUBINUR NEGATIVE 11/18/2014 1257   KETONESUR NEGATIVE 11/18/2014 1257   PROTEINUR  NEGATIVE 11/18/2014 1257   UROBILINOGEN 0.2 11/18/2014 1257   NITRITE NEGATIVE 11/18/2014 1257   LEUKOCYTESUR SMALL (A) 11/18/2014 1257   Sepsis Labs: _0 (procalcitonin:4,lacticidven:4) )No results found for this or any previous visit (from the past 240 hour(s)).   Radiological Exams on Admission: No results found.   EKG: Independently reviewed.  Sinus rhythm, QTC 467, nonspecific T-wave change.   Assessment/Plan Principal Problem:   Acute on chronic respiratory failure with hypoxia (HCC) Active Problems:   HTN (hypertension)   Interstitial lung disease (HCC)   Interstitial fibrosis (HCC)   Pulmonary fibrosis (HCC)   Hypersensitivity pneumonitis (HCC)   Acute on chronic respiratory failure with hypoxia ([facility]): possible due to flareup hypersensitivity pneumonitis/pulmonary fibrosis. Pt may have upper respiratory viral infection. CXR negative for infiltration.  -will place on telemetry bed for obs --Nebulizers: scheduled Atrovent and prn Xopenex Nebs -Solu-Medrol 60 mg IV tid  -Abx: Rocephin and doxycycline.  -Mucinex for cough  -Urine S. pneumococcal antigen -Follow up blood culture x2, sputum culture, respiratory virus panel, Flu pcr -Please call her pulmonologist Dr. Waunita Schooner in the morning  HTN (hypertension): Bp 165/68. Pt is taking lasix at home -IV hydralazine  Prn -hold lasix since pt is at risks of developing sepsis   DVT ppx: SQ Lovenox Code Status: Full code Family Communication: Yes, patient's husband at bed side Disposition Plan:  Anticipate discharge back to previous home environment Consults called:  none Admission status: Obs / tele    Date of Service 06/14/2016    Ivor Costa [facility] Pager [phone]  If 7PM-7AM, please contact night-coverage [email or web address] Password [email or web address] 06/14/2016, 12:16 AM

## 2016-06-13 NOTE — ED Notes (Signed)
Pt is ambulatory in waiting area and asks about plan.  Delay explained.  No CXR due to pt having a disc with her xray with her.  Notified to check back in with me as needed.  Pt is wearing a mask

## 2016-06-13 NOTE — ED Notes (Signed)
[facility] called for PT transport

## 2016-06-13 NOTE — ED Notes (Signed)
Pt family was very upset when I took pt vitals. He stated that they was sent here by her doctors and they was not informed that they was going to have to wait. He said he will not come back to the emergency room ever again.

## 2016-06-13 NOTE — ED Notes (Signed)
Patient was ambulated in hall approximately 50 feet on RA. Pt O2 stats dropped to 79. Pt returned to stretcher and placed on 3L O2 and currently at 97%.

## 2016-06-13 NOTE — ED Notes (Signed)
Changed pt's oxygen tank,

## 2016-06-13 NOTE — ED Notes (Signed)
Pt in hallway, unable to cardiac monitor

## 2016-06-13 NOTE — ED Triage Notes (Signed)
Pt reports SOB that started last Wednesday. She was seen by her PCP last Thursday for this and prescribed abx and nebulizer. She reports her breathing became worse over the weekend. Pt room air O2 was 86%, RR-30 breaths per minute. Pt placed on 2L [place] in triage and sats increased to 98%. She does not wear home O2.

## 2016-06-14 ENCOUNTER — Encounter (HOSPITAL_COMMUNITY): Payer: Self-pay | Admitting: Nurse Practitioner

## 2016-06-14 DIAGNOSIS — J8489 Other specified interstitial pulmonary diseases: Secondary | ICD-10-CM | POA: Diagnosis present

## 2016-06-14 DIAGNOSIS — Z882 Allergy status to sulfonamides status: Secondary | ICD-10-CM | POA: Diagnosis not present

## 2016-06-14 DIAGNOSIS — I10 Essential (primary) hypertension: Secondary | ICD-10-CM | POA: Diagnosis not present

## 2016-06-14 DIAGNOSIS — R0902 Hypoxemia: Secondary | ICD-10-CM | POA: Diagnosis not present

## 2016-06-14 DIAGNOSIS — I272 Pulmonary hypertension, unspecified: Secondary | ICD-10-CM | POA: Diagnosis present

## 2016-06-14 DIAGNOSIS — E785 Hyperlipidemia, unspecified: Secondary | ICD-10-CM | POA: Diagnosis present

## 2016-06-14 DIAGNOSIS — Z86010 Personal history of colonic polyps: Secondary | ICD-10-CM | POA: Diagnosis not present

## 2016-06-14 DIAGNOSIS — I509 Heart failure, unspecified: Secondary | ICD-10-CM | POA: Diagnosis not present

## 2016-06-14 DIAGNOSIS — Z96652 Presence of left artificial knee joint: Secondary | ICD-10-CM | POA: Diagnosis present

## 2016-06-14 DIAGNOSIS — K219 Gastro-esophageal reflux disease without esophagitis: Secondary | ICD-10-CM | POA: Diagnosis present

## 2016-06-14 DIAGNOSIS — J679 Hypersensitivity pneumonitis due to unspecified organic dust: Secondary | ICD-10-CM | POA: Diagnosis not present

## 2016-06-14 DIAGNOSIS — J309 Allergic rhinitis, unspecified: Secondary | ICD-10-CM | POA: Diagnosis present

## 2016-06-14 DIAGNOSIS — Z91048 Other nonmedicinal substance allergy status: Secondary | ICD-10-CM | POA: Diagnosis not present

## 2016-06-14 DIAGNOSIS — M069 Rheumatoid arthritis, unspecified: Secondary | ICD-10-CM | POA: Diagnosis present

## 2016-06-14 DIAGNOSIS — J8410 Pulmonary fibrosis, unspecified: Secondary | ICD-10-CM | POA: Diagnosis not present

## 2016-06-14 DIAGNOSIS — J849 Interstitial pulmonary disease, unspecified: Secondary | ICD-10-CM

## 2016-06-14 DIAGNOSIS — J9621 Acute and chronic respiratory failure with hypoxia: Secondary | ICD-10-CM | POA: Diagnosis not present

## 2016-06-14 DIAGNOSIS — Z79899 Other long term (current) drug therapy: Secondary | ICD-10-CM | POA: Diagnosis not present

## 2016-06-14 DIAGNOSIS — Z7982 Long term (current) use of aspirin: Secondary | ICD-10-CM | POA: Diagnosis not present

## 2016-06-14 DIAGNOSIS — J209 Acute bronchitis, unspecified: Secondary | ICD-10-CM | POA: Diagnosis present

## 2016-06-14 LAB — RESPIRATORY PANEL BY PCR

## 2016-06-14 LAB — LACTIC ACID, PLASMA: Lactic Acid, Venous: 0.9 mmol/L (ref 0.5–1.9)

## 2016-06-14 LAB — INFLUENZA PANEL BY PCR (TYPE A & B)
Influenza A By PCR: NEGATIVE
Influenza B By PCR: NEGATIVE

## 2016-06-14 MED ORDER — LEVALBUTEROL HCL 1.25 MG/0.5ML IN NEBU
1.2500 mg | INHALATION_SOLUTION | Freq: Four times a day (QID) | RESPIRATORY_TRACT | Status: DC
  Filled 2016-06-14: qty 0.5

## 2016-06-14 MED ORDER — ORAL CARE MOUTH RINSE
15.0000 mL | Freq: Two times a day (BID) | OROMUCOSAL | Status: DC
  Administered 2016-06-15 – 2016-06-17 (×5): 15 mL via OROMUCOSAL

## 2016-06-14 MED ORDER — LEVALBUTEROL HCL 1.25 MG/0.5ML IN NEBU
1.2500 mg | INHALATION_SOLUTION | Freq: Three times a day (TID) | RESPIRATORY_TRACT | Status: DC
  Administered 2016-06-14 – 2016-06-19 (×16): 1.25 mg via RESPIRATORY_TRACT
  Filled 2016-06-14 (×18): qty 0.5

## 2016-06-14 MED ORDER — LORAZEPAM 2 MG/ML IJ SOLN
INTRAMUSCULAR | Status: AC
  Filled 2016-06-14: qty 1

## 2016-06-14 NOTE — Care Management Note (Signed)
Case Management Note  Patient Details  Name: [name] MRN: [id number] Date of Birth: Oct 21, 1940  Subjective/Objective:                  From home. /76 year old female with pulmonary fibrosis, hypersensitivity pneumonitis, hypertension, GERD, history of rheumatoid arthritis presented with shortness of breath, cough in the past 4 days, progressively getting worse. Action/Plan: Follow for disposition needs. Admit to OBSERVATION (Acute on chronic respiratory failure with hypoxia); anticipate discharge [facility].   Expected Discharge Date:  06/16/16               Expected Discharge Plan:  Home/Self Care  In-House Referral:  NA  Discharge planning Services  NA   Status of Service:  In process, will continue to follow    Additional Comments:  Fuller Mandril, RN 06/14/2016, 3:09 PM

## 2016-06-14 NOTE — ED Notes (Signed)
Pt with belongings transported to 3E08. Family at the bedside.

## 2016-06-14 NOTE — ED Notes (Signed)
Breakfast tray ordered

## 2016-06-14 NOTE — ED Notes (Signed)
Heart Healthy Diet was ordered for patient for Lunch.

## 2016-06-14 NOTE — Progress Notes (Signed)
Placed order for IV team. Pt states IV team had to start her last IV in the ED. Will monitor.

## 2016-06-14 NOTE — Progress Notes (Signed)
[facility]                                                                              Patient Demographics  [name], is a 76 y.o. female, DOB - 20-Jan-1941, BHQ:[id number]  Admit date - 06/13/2016   Admitting Physician Ivor Costa, MD  Outpatient Primary MD for the patient is Melinda Crutch, MD  Outpatient specialists:   LOS - 0  days    Chief Complaint  Patient presents with  . Shortness of Breath       Brief summary   Patient is a 76 year old female with pulmonary fibrosis, hypersensitivity pneumonitis, hypertension, GERD, history of rheumatoid arthritis presented with shortness of breath, cough in the past 4 days, progressively getting worse. Patient was seen by PCP last week, no improvement with the Zithromax and nebs outpatient. In ED, O2 sats 86% on room air, improved to 94% on 2 L Patient was seen in [place] [facility] on the day of admission, had chest x-ray which was negative for infiltrates. Patient was admitted for acute bronchitis with hypoxia  Assessment & Plan    Principal Problem:  Acute on chronic respiratory failure with hypoxia ([place]): possible due to flareup hypersensitivity pneumonitis/pulmonary fibrosis versus acute bronchitis - Patient placed on scheduled Atrovent and prn Xopenex Nebs -Continue IV Solu-Medrol -patient was placed on doxycycline and Rocephin  - Flu PCR negative, follow respiratory virus panel. - Follow blood cultures, urine legionella antigen, urine strep antigen     HTN (hypertension): - Takes Lasix at home, hold off for now, BP stable  History of hypersensitivity pneumonitis, pulmonary fibrosis  - Continue management as #1, home O2 evaluation prior to discharge  -  if not improving, will consult with patient's pulmonologist, Dr. Lake Bells  Code Status: Full CODE STATUS  DVT Prophylaxis:  Lovenox  Family Communication: Discussed in detail with the patient, all imaging results, lab results explained to the patient     Disposition Plan:   Time Spent in minutes  25 minutes  Procedures:    Consultants:     Antimicrobials:   Doxycycline 1/9  Rocephin 1/9    Medications  Scheduled Meds: . dextromethorphan-guaiFENesin  1 tablet Oral BID  . doxycycline  100 mg Oral Q12H  . enoxaparin (LOVENOX) injection  40 mg Subcutaneous Q24H  . levalbuterol  1.25 mg Nebulization TID  . methylPREDNISolone (SOLU-MEDROL) injection  60 mg Intravenous Q8H   Continuous Infusions: . sodium chloride 125 mL/hr at 06/14/16 1242  . cefTRIAXone (ROCEPHIN)  IV     PRN Meds:.acetaminophen, hydrALAZINE, ibuprofen, ipratropium, loratadine, ondansetron, zolpidem   Antibiotics   Anti-infectives    Start     Dose/Rate Route Frequency Ordered Stop   06/14/16 2200  cefTRIAXone (ROCEPHIN) 1 g in dextrose 5 % 50 mL IVPB     1 g 100 mL/hr over 30 Minutes Intravenous Every 24 hours 06/13/16 2357     06/14/16 0115  doxycycline (VIBRA-TABS) tablet 100 mg     100 mg Oral Every 12 hours 06/13/16 2357     06/13/16 2345  cefTRIAXone (ROCEPHIN) 1 g in dextrose 5 % 50 mL IVPB  1 g 100 mL/hr over 30 Minutes Intravenous  Once 06/13/16 2334 06/14/16 0108   06/13/16 2345  azithromycin (ZITHROMAX) tablet 500 mg  Status:  Discontinued     500 mg Oral  Once 06/13/16 2334 06/13/16 2353        Subjective:   Raynell Lirette was seen and examined today.  Feeling slightly better this morning, seen in ED. No chest pain.  Patient denies dizziness, abdominal pain, N/V/D/C, new weakness, numbess, tingling. No acute events overnight.    Objective:   Vitals:   06/14/16 0730 06/14/16 0830 06/14/16 0855 06/14/16 0915  BP: 156/73     Pulse:      Resp: 18 14  (!) 29  Temp:      TempSrc:      SpO2:   99%   Weight:      Height:        Intake/Output Summary (Last 24 hours) at 06/14/16 1247 Last data filed at 06/14/16 0108  Gross per 24 hour  Intake               50 ml  Output                0 ml  Net               50 ml      Wt Readings from Last 3 Encounters:  06/13/16 98 kg (216 lb)  04/20/16 103.4 kg (228 lb)  09/03/15 103.4 kg (228 lb)     Exam  General: Alert and oriented x 3, NAD  HEENT:   Neck  Cardiovascular: S1 S2 auscultated, no rubs, murmurs or gallops. Regular rate and rhythm.  Respiratory: Mild scattered rhonchi bilaterally   Gastrointestinal: Soft, nontender, nondistended, + bowel sounds  Ext: no cyanosis clubbing or edema  Neuro: AAOx3, Cr N's II- XII. Strength 5/5 upper and lower extremities bilaterally  Skin: No rashes  Psych: Normal affect and demeanor, alert and oriented x3    Data Reviewed:  I have personally reviewed following labs and imaging studies  Micro Results No results found for this or any previous visit (from the past 240 hour(s)).  Radiology Reports No results found.  Lab Data:  CBC:  Recent Labs Lab 06/13/16 1349  WBC 10.8*  HGB 14.2  HCT 42.5  MCV 89.3  PLT 161*   Basic Metabolic Panel:  Recent Labs Lab 06/13/16 1349  NA 140  K 3.7  CL 104  CO2 26  GLUCOSE 104*  BUN 7  CREATININE 0.60  CALCIUM 9.3   GFR: Estimated Creatinine Clearance: 72 mL/min (by C-G formula based on SCr of 0.6 mg/dL). Liver Function Tests: No results for input(s): AST, ALT, ALKPHOS, BILITOT, PROT, ALBUMIN in the last 168 hours. No results for input(s): LIPASE, AMYLASE in the last 168 hours. No results for input(s): AMMONIA in the last 168 hours. Coagulation Profile: No results for input(s): INR, PROTIME in the last 168 hours. Cardiac Enzymes: No results for input(s): CKTOTAL, CKMB, CKMBINDEX, TROPONINI in the last 168 hours. BNP (last 3 results) No results for input(s): PROBNP in the last 8760 hours. HbA1C: No results for input(s): HGBA1C in the last 72 hours. CBG: No results for input(s): GLUCAP in the last 168 hours. Lipid Profile: No results for input(s): CHOL, HDL, LDLCALC, TRIG, CHOLHDL, LDLDIRECT in the last 72 hours. Thyroid Function  Tests: No results for input(s): TSH, T4TOTAL, FREET4, T3FREE, THYROIDAB in the last 72 hours. Anemia Panel: No results for input(s): VITAMINB12,  FOLATE, FERRITIN, TIBC, IRON, RETICCTPCT in the last 72 hours. Urine analysis:    Component Value Date/Time   COLORURINE YELLOW 11/18/2014 1257   APPEARANCEUR CLEAR 11/18/2014 1257   LABSPEC 1.008 11/18/2014 1257   PHURINE 6.5 11/18/2014 1257   GLUCOSEU NEGATIVE 11/18/2014 1257   GLUCOSEU NEGATIVE 07/09/2007 0000   HGBUR NEGATIVE 11/18/2014 1257   BILIRUBINUR NEGATIVE 11/18/2014 1257   KETONESUR NEGATIVE 11/18/2014 1257   PROTEINUR NEGATIVE 11/18/2014 1257   UROBILINOGEN 0.2 11/18/2014 1257   NITRITE NEGATIVE 11/18/2014 1257   LEUKOCYTESUR SMALL (A) 11/18/2014 1257     RAI,RIPUDEEP M.D. [facility] 06/14/2016, 12:47 PM  Pager: [phone] Between 7am to 7pm - call Pager - [phone]  After 7pm go to [email or web address] - password [email or web address]  Call night coverage person covering after 7pm

## 2016-06-15 LAB — BRAIN NATRIURETIC PEPTIDE: B Natriuretic Peptide: 157.3 pg/mL — ABNORMAL HIGH (ref 0.0–100.0)

## 2016-06-15 LAB — STREP PNEUMONIAE URINARY ANTIGEN: Strep Pneumo Urinary Antigen: NEGATIVE

## 2016-06-15 MED ORDER — FUROSEMIDE 20 MG PO TABS
20.0000 mg | ORAL_TABLET | Freq: Every day | ORAL | Status: DC
  Administered 2016-06-15 – 2016-06-19 (×5): 20 mg via ORAL
  Filled 2016-06-15 (×5): qty 1

## 2016-06-15 NOTE — Progress Notes (Signed)
[facility]                                                                              Patient Demographics  Alizee Maple, is a 76 y.o. female, DOB - June 17, 1940, WKM:[id number]  Admit date - 06/13/2016   Admitting Physician Ivor Costa, MD  Outpatient Primary MD for the patient is Melinda Crutch, MD  Outpatient specialists:   LOS - 1  days    Chief Complaint  Patient presents with  . Shortness of Breath       Brief summary   Patient is a 76 year old female with pulmonary fibrosis, hypersensitivity pneumonitis, hypertension, GERD, history of rheumatoid arthritis presented with shortness of breath, cough in the past 4 days, progressively getting worse. Patient was seen by PCP last week, no improvement with the Zithromax and nebs outpatient. In ED, O2 sats 86% on room air, improved to 94% on 2 L Patient was seen in [place] clinic on the day of admission, had chest x-ray which was negative for infiltrates. Patient was admitted for acute bronchitis with hypoxia  Assessment & Plan    Principal Problem:  Acute on chronic respiratory failure with hypoxia ([place]): possible due to flareup hypersensitivity pneumonitis/pulmonary fibrosis versus acute bronchitis - Patient placed on scheduled Atrovent and prn Xopenex Nebs - Continue IV Solu-Medrol -patient was placed on doxycycline and Rocephin  - Flu PCR negative,Respiratory virus panel negative, urine strep antigen negative - Blood cultures negative so far - DC IV fluid, takes Lasix at home, will put on 20 mg   HTN (hypertension): - Takes Lasix at home, hold off for now, BP stable  History of hypersensitivity pneumonitis, pulmonary fibrosis  - Continue management as #1, home O2 evaluation prior to discharge  -  if not improving, will consult with patient's pulmonologist, Dr. Lake Bells  Code Status: Full CODE STATUS  DVT Prophylaxis:  Lovenox  Family Communication: Discussed in detail with the patient, all imaging  results, lab results explained to the patient   Disposition Plan:   Time Spent in minutes  25 minutes  Procedures:    Consultants:     Antimicrobials:   Doxycycline 1/9  Rocephin 1/9    Medications  Scheduled Meds: . cefTRIAXone (ROCEPHIN)  IV  1 g Intravenous Q24H  . dextromethorphan-guaiFENesin  1 tablet Oral BID  . doxycycline  100 mg Oral Q12H  . enoxaparin (LOVENOX) injection  40 mg Subcutaneous Q24H  . levalbuterol  1.25 mg Nebulization TID  . mouth rinse  15 mL Mouth Rinse BID  . methylPREDNISolone (SOLU-MEDROL) injection  60 mg Intravenous Q8H   Continuous Infusions:  PRN Meds:.acetaminophen, hydrALAZINE, ibuprofen, ipratropium, loratadine, ondansetron, zolpidem   Antibiotics   Anti-infectives    Start     Dose/Rate Route Frequency Ordered Stop   06/14/16 2200  cefTRIAXone (ROCEPHIN) 1 g in dextrose 5 % 50 mL IVPB     1 g 100 mL/hr over 30 Minutes Intravenous Every 24 hours 06/13/16 2357     06/14/16 0115  doxycycline (VIBRA-TABS) tablet 100 mg     100 mg Oral Every 12 hours 06/13/16 2357     06/13/16 2345  cefTRIAXone (ROCEPHIN) 1 g in dextrose 5 % 50 mL IVPB     1 g 100 mL/hr over 30 Minutes Intravenous  Once 06/13/16 2334 06/14/16 0108   06/13/16 2345  azithromycin (ZITHROMAX) tablet 500 mg  Status:  Discontinued     500 mg Oral  Once 06/13/16 2334 06/13/16 2353        Subjective:   Aima Muilenburg was seen and examined today.  Feeling better this morning. No chest pain.  Patient denies dizziness, abdominal pain, N/V/D/C, new weakness, numbess, tingling. No acute events overnight.    Objective:   Vitals:   06/15/16 0700 06/15/16 0756 06/15/16 0759 06/15/16 1223  BP: (!) 141/61   (!) 143/56  Pulse: 87   90  Resp: 18   18  Temp:    98.7 F (37.1 C)  TempSrc:    Oral  SpO2: 96% 96% 96%   Weight:      Height:        Intake/Output Summary (Last 24 hours) at 06/15/16 1324 Last data filed at 06/15/16 1132  Gross per 24 hour  Intake           3421.25 ml  Output             1100 ml  Net          2321.25 ml     Wt Readings from Last 3 Encounters:  06/15/16 100.2 kg (220 lb 14.4 oz)  04/20/16 103.4 kg (228 lb)  09/03/15 103.4 kg (228 lb)     Exam  General: Alert and oriented x 3, NAD  HEENT:   Neck  Cardiovascular: S1 S2 clear, RRR  Respiratory: dec BS at bases   Gastrointestinal: Soft, nontender, nondistended, + bowel sounds  Ext: no cyanosis clubbing or edema  Neuro:   Skin: No rashes  Psych: Normal affect and demeanor, alert and oriented x3    Data Reviewed:  I have personally reviewed following labs and imaging studies  Micro Results Recent Results (from the past 240 hour(s))  Culture, blood (routine x 2) Call MD if unable to obtain prior to antibiotics being given     Status: None (Preliminary result)   Collection Time: 06/14/16 12:41 AM  Result Value Ref Range Status   Specimen Description BLOOD RIGHT FOREARM  Final   Special Requests IN PEDIATRIC BOTTLE 4ML  Final   Culture NO GROWTH 1 DAY  Final   Report Status PENDING  Incomplete  Culture, blood (routine x 2) Call MD if unable to obtain prior to antibiotics being given     Status: None (Preliminary result)   Collection Time: 06/14/16 12:48 AM  Result Value Ref Range Status   Specimen Description BLOOD RIGHT HAND  Final   Special Requests BOTTLES DRAWN AEROBIC AND ANAEROBIC 5ML  Final   Culture NO GROWTH 1 DAY  Final   Report Status PENDING  Incomplete  Respiratory Panel by PCR     Status: None   Collection Time: 06/14/16  1:20 AM  Result Value Ref Range Status   Adenovirus NOT DETECTED NOT DETECTED Final   Coronavirus 229E NOT DETECTED NOT DETECTED Final   Coronavirus HKU1 NOT DETECTED NOT DETECTED Final   Coronavirus NL63 NOT DETECTED NOT DETECTED Final   Coronavirus OC43 NOT DETECTED NOT DETECTED Final   Metapneumovirus NOT DETECTED NOT DETECTED Final   Rhinovirus / Enterovirus NOT DETECTED NOT DETECTED Final   Influenza A NOT  DETECTED NOT DETECTED Final   Influenza B NOT DETECTED  NOT DETECTED Final   Parainfluenza Virus 1 NOT DETECTED NOT DETECTED Final   Parainfluenza Virus 2 NOT DETECTED NOT DETECTED Final   Parainfluenza Virus 3 NOT DETECTED NOT DETECTED Final   Parainfluenza Virus 4 NOT DETECTED NOT DETECTED Final   Respiratory Syncytial Virus NOT DETECTED NOT DETECTED Final   Bordetella pertussis NOT DETECTED NOT DETECTED Final   Chlamydophila pneumoniae NOT DETECTED NOT DETECTED Final   Mycoplasma pneumoniae NOT DETECTED NOT DETECTED Final    Radiology Reports No results found.  Lab Data:  CBC:  Recent Labs Lab 06/13/16 1349  WBC 10.8*  HGB 14.2  HCT 42.5  MCV 89.3  PLT 249*   Basic Metabolic Panel:  Recent Labs Lab 06/13/16 1349  NA 140  K 3.7  CL 104  CO2 26  GLUCOSE 104*  BUN 7  CREATININE 0.60  CALCIUM 9.3   GFR: Estimated Creatinine Clearance: 72.7 mL/min (by C-G formula based on SCr of 0.6 mg/dL). Liver Function Tests: No results for input(s): AST, ALT, ALKPHOS, BILITOT, PROT, ALBUMIN in the last 168 hours. No results for input(s): LIPASE, AMYLASE in the last 168 hours. No results for input(s): AMMONIA in the last 168 hours. Coagulation Profile: No results for input(s): INR, PROTIME in the last 168 hours. Cardiac Enzymes: No results for input(s): CKTOTAL, CKMB, CKMBINDEX, TROPONINI in the last 168 hours. BNP (last 3 results) No results for input(s): PROBNP in the last 8760 hours. HbA1C: No results for input(s): HGBA1C in the last 72 hours. CBG: No results for input(s): GLUCAP in the last 168 hours. Lipid Profile: No results for input(s): CHOL, HDL, LDLCALC, TRIG, CHOLHDL, LDLDIRECT in the last 72 hours. Thyroid Function Tests: No results for input(s): TSH, T4TOTAL, FREET4, T3FREE, THYROIDAB in the last 72 hours. Anemia Panel: No results for input(s): VITAMINB12, FOLATE, FERRITIN, TIBC, IRON, RETICCTPCT in the last 72 hours. Urine analysis:    Component Value  Date/Time   COLORURINE YELLOW 11/18/2014 1257   APPEARANCEUR CLEAR 11/18/2014 1257   LABSPEC 1.008 11/18/2014 1257   PHURINE 6.5 11/18/2014 1257   GLUCOSEU NEGATIVE 11/18/2014 1257   GLUCOSEU NEGATIVE 07/09/2007 0000   HGBUR NEGATIVE 11/18/2014 1257   BILIRUBINUR NEGATIVE 11/18/2014 1257   KETONESUR NEGATIVE 11/18/2014 1257   PROTEINUR NEGATIVE 11/18/2014 1257   UROBILINOGEN 0.2 11/18/2014 1257   NITRITE NEGATIVE 11/18/2014 1257   LEUKOCYTESUR SMALL (A) 11/18/2014 1257     RAI,RIPUDEEP M.D. [facility] 06/15/2016, 1:24 PM  Pager: [phone] Between 7am to 7pm - call Pager - [phone]  After 7pm go to [email or web address] - password [email or web address]  Call night coverage person covering after 7pm

## 2016-06-16 LAB — CBC
HCT: 37.1 % (ref 36.0–46.0)
Hemoglobin: 12 g/dL (ref 12.0–15.0)
MCH: 29.3 pg (ref 26.0–34.0)
MCHC: 32.3 g/dL (ref 30.0–36.0)
MCV: 90.7 fL (ref 78.0–100.0)
Platelets: 429 10*3/uL — ABNORMAL HIGH (ref 150–400)
RBC: 4.09 MIL/uL (ref 3.87–5.11)
RDW: 15.6 % — ABNORMAL HIGH (ref 11.5–15.5)
WBC: 13.3 10*3/uL — ABNORMAL HIGH (ref 4.0–10.5)

## 2016-06-16 LAB — BASIC METABOLIC PANEL
Anion gap: 8 (ref 5–15)
BUN: 10 mg/dL (ref 6–20)
CO2: 25 mmol/L (ref 22–32)
Calcium: 8.7 mg/dL — ABNORMAL LOW (ref 8.9–10.3)
Chloride: 108 mmol/L (ref 101–111)
Creatinine, Ser: 0.52 mg/dL (ref 0.44–1.00)
GFR calc Af Amer: >60 mL/min (ref >60)
GFR calc non Af Amer: >60 mL/min (ref >60)
Glucose, Bld: 125 mg/dL — ABNORMAL HIGH (ref 65–99)
Potassium: 3.9 mmol/L (ref 3.5–5.1)
Sodium: 141 mmol/L (ref 135–145)

## 2016-06-16 MED ORDER — PREDNISONE 50 MG PO TABS
60.0000 mg | ORAL_TABLET | Freq: Every day | ORAL | Status: DC
  Administered 2016-06-16 – 2016-06-18 (×3): 60 mg via ORAL
  Filled 2016-06-16 (×3): qty 1

## 2016-06-16 MED ORDER — ALUM & MAG HYDROXIDE-SIMETH 200-200-20 MG/5ML PO SUSP
15.0000 mL | Freq: Four times a day (QID) | ORAL | Status: DC | PRN
  Administered 2016-06-16: 15 mL via ORAL
  Filled 2016-06-16: qty 30

## 2016-06-16 MED ORDER — PREDNISONE 50 MG PO TABS: 60.0000 mg | ORAL_TABLET | Freq: Every day | ORAL | Status: DC

## 2016-06-16 MED ORDER — FUROSEMIDE 10 MG/ML IJ SOLN
20.0000 mg | Freq: Once | INTRAMUSCULAR | Status: AC
Start: 2016-06-16 — End: 2016-06-16
  Administered 2016-06-16: 20 mg via INTRAVENOUS
  Filled 2016-06-16: qty 2

## 2016-06-16 NOTE — Evaluation (Signed)
Physical Therapy Evaluation Patient Details Name: [name] MRN: [id number] DOB: 16-May-1941 Today's Date: 06/16/2016   History of Present Illness  Pt adm with Acute on chronic respiratory failure with hypoxia. PMH - pulmonary fibrosis, hypersensitivity pneumonitis, hypertension, GERD, history of rheumatoid arthritis, recent shingles  Clinical Impression  Pt currently requiring supplemental O2 with activity. Pt doing well with mobility and no further PT needed.  Ready for dc from PT standpoint.      Follow Up Recommendations No PT follow up    Equipment Recommendations  None recommended by PT    Recommendations for Other Services       Precautions / Restrictions Precautions Precautions: Other (comment) Precaution Comments: watch SpO2 Restrictions Weight Bearing Restrictions: No      Mobility  Bed Mobility                  Transfers Overall transfer level: Independent                  Ambulation/Gait Ambulation/Gait assistance: Modified independent (Device/Increase time) Ambulation Distance (Feet): 350 Feet Assistive device: None Gait Pattern/deviations: WFL(Within Functional Limits)   Gait velocity interpretation: Below normal speed for age/gender General Gait Details: Steady gait. Requires standing rest breaks due to decr SpO2. See separate note.  Stairs            Wheelchair Mobility    Modified Rankin (Stroke Patients Only)       Balance Overall balance assessment: No apparent balance deficits (not formally assessed)                                           Pertinent Vitals/Pain Pain Assessment: No/denies pain    Home Living Family/patient expects to be discharged to:: Private residence Living Arrangements: Alone     Home Access: Stairs to enter Entrance Stairs-Rails: None Entrance Stairs-Number of Steps: 1 Home Layout: One level Home Equipment: Environmental consultant - 2 wheels      Prior Function Level of  Independence: Independent               Hand Dominance   Dominant Hand: Right    Extremity/Trunk Assessment   Upper Extremity Assessment Upper Extremity Assessment: Overall WFL for tasks assessed    Lower Extremity Assessment Lower Extremity Assessment: Overall WFL for tasks assessed       Communication   Communication: No difficulties  Cognition Arousal/Alertness: Awake/alert Behavior During Therapy: WFL for tasks assessed/performed Overall Cognitive Status: Within Functional Limits for tasks assessed                      General Comments      Exercises     Assessment/Plan    PT Assessment Patent does not need any further PT services  PT Problem List            PT Treatment Interventions      PT Goals (Current goals can be found in the Care Plan section)  Acute Rehab PT Goals PT Goal Formulation: All assessment and education complete, DC therapy    Frequency     Barriers to discharge        Co-evaluation               End of Session Equipment Utilized During Treatment: Oxygen Activity Tolerance: Patient limited by fatigue Patient left: in bed Nurse Communication: Mobility status;Other (  comment) (decr SpO2)         Time: 0123-9359 PT Time Calculation (min) (ACUTE ONLY): 17 min   Charges:   PT Evaluation $PT Eval Low Complexity: 1 Procedure     PT G CodesShary Decamp [place] Jul 02, 2016, 3:30 PM [employer] PT [phone]

## 2016-06-16 NOTE — Progress Notes (Signed)
SATURATION QUALIFICATIONS: (This note is used to comply with regulatory documentation for home oxygen)  Patient Saturations on Room Air at Rest = 93%  Patient Saturations on Room Air while Ambulating = 84%  Patient Saturations on 3 Liters of oxygen while Ambulating = 85%  Please briefly explain why patient needs home oxygen: Pt unable to maintain adequate oxygen levels with activity.  [employer] PT [phone]

## 2016-06-16 NOTE — Progress Notes (Signed)
[facility]                                                                              Patient Demographics  [name], is a 76 y.o. female, DOB - January 21, 1941, FHQ:[id number]  Admit date - 06/13/2016   Admitting Physician Ivor Costa, MD  Outpatient Primary MD for the patient is Melinda Crutch, MD  Outpatient specialists:   LOS - 2  days    Chief Complaint  Patient presents with  . Shortness of Breath       Brief summary   Patient is a 76 year old female with pulmonary fibrosis, hypersensitivity pneumonitis, hypertension, GERD, history of rheumatoid arthritis presented with shortness of breath, cough in the past 4 days, progressively getting worse. Patient was seen by PCP last week, no improvement with the Zithromax and nebs outpatient. In ED, O2 sats 86% on room air, improved to 94% on 2 L Patient was seen in [place] [facility] on the day of admission, had chest x-ray which was negative for infiltrates. Patient was admitted for acute bronchitis with hypoxia  Assessment & Plan    Principal Problem:  Acute on chronic respiratory failure with hypoxia ([place]): possible due to flareup hypersensitivity pneumonitis/pulmonary fibrosis versus acute bronchitis - Patient placed on scheduled Atrovent and prn Xopenex Nebs - DC IV Solu-Medrol, transition to oral prednisone -patient was placed on doxycycline and Rocephin  - Flu PCR negative,Respiratory virus panel negative, urine strep antigen negative - Blood cultures negative so far -Still on O2, unable to wean off, IV fluids were discontinued, 1.8 L still positive, will give Lasix 20 mg IV 1, continue oral Lasix. - Home O2 evaluation tomorrow    HTN (hypertension): -Continue Lasix  History of hypersensitivity pneumonitis, pulmonary fibrosis  - Continue management as #1, home O2 evaluation prior to discharge  - Outpatient follow-up with Dr. Lake Bells  Code Status: Full CODE STATUS  DVT Prophylaxis:  Lovenox  Family  Communication: Discussed in detail with the patient, all imaging results, lab results explained to the patient   Disposition Plan: Hopefully DC home in a.m.  Time Spent in minutes  25 minutes  Procedures:    Consultants:     Antimicrobials:   Doxycycline 1/9  Rocephin 1/9    Medications  Scheduled Meds: . cefTRIAXone (ROCEPHIN)  IV  1 g Intravenous Q24H  . dextromethorphan-guaiFENesin  1 tablet Oral BID  . doxycycline  100 mg Oral Q12H  . enoxaparin (LOVENOX) injection  40 mg Subcutaneous Q24H  . furosemide  20 mg Oral Daily  . levalbuterol  1.25 mg Nebulization TID  . mouth rinse  15 mL Mouth Rinse BID  . methylPREDNISolone (SOLU-MEDROL) injection  60 mg Intravenous Q8H   Continuous Infusions:  PRN Meds:.acetaminophen, hydrALAZINE, ibuprofen, ipratropium, loratadine, ondansetron, zolpidem   Antibiotics   Anti-infectives    Start     Dose/Rate Route Frequency Ordered Stop   06/14/16 2200  cefTRIAXone (ROCEPHIN) 1 g in dextrose 5 % 50 mL IVPB     1 g 100 mL/hr over 30 Minutes Intravenous Every 24 hours 06/13/16 2357     06/14/16 0115  doxycycline (VIBRA-TABS) tablet 100 mg  100 mg Oral Every 12 hours 06/13/16 2357     06/13/16 2345  cefTRIAXone (ROCEPHIN) 1 g in dextrose 5 % 50 mL IVPB     1 g 100 mL/hr over 30 Minutes Intravenous  Once 06/13/16 2334 06/14/16 0108   06/13/16 2345  azithromycin (ZITHROMAX) tablet 500 mg  Status:  Discontinued     500 mg Oral  Once 06/13/16 2334 06/13/16 2353        Subjective:   Kerry-Anne Hoadley was seen and examined today. Feels a lot better today, still on O2.  No chest pain.  Patient denies dizziness, abdominal pain, N/V/D/C, new weakness, numbess, tingling. No acute events overnight.    Objective:   Vitals:   06/15/16 1944 06/15/16 2039 06/16/16 0546 06/16/16 0814  BP: (!) 155/69  140/66   Pulse: 93 83 77 81  Resp: _0 Temp: 97.7 F (36.5 C)  97.9 F (36.6 C)   TempSrc: Oral  Oral   SpO2: 97% 96%  97% 97%  Weight:   101.3 kg (223 lb 4.8 oz)   Height:        Intake/Output Summary (Last 24 hours) at 06/16/16 1222 Last data filed at 06/16/16 1200  Gross per 24 hour  Intake              720 ml  Output             1700 ml  Net             -980 ml     Wt Readings from Last 3 Encounters:  06/16/16 101.3 kg (223 lb 4.8 oz)  04/20/16 103.4 kg (228 lb)  09/03/15 103.4 kg (228 lb)     Exam  General: Alert and oriented x 3, NAD  HEENT:   Neck  Cardiovascular: S1 S2 clear, RRR  Respiratory: dec BS at bases   Gastrointestinal: Soft, nontender, nondistended, + bowel sounds  Ext: no cyanosis clubbing or edema  Neuro:   Skin: No rashes  Psych: Normal affect and demeanor, alert and oriented x3    Data Reviewed:  I have personally reviewed following labs and imaging studies  Micro Results Recent Results (from the past 240 hour(s))  Culture, blood (routine x 2) Call MD if unable to obtain prior to antibiotics being given     Status: None (Preliminary result)   Collection Time: 06/14/16 12:41 AM  Result Value Ref Range Status   Specimen Description BLOOD RIGHT FOREARM  Final   Special Requests IN PEDIATRIC BOTTLE 4ML  Final   Culture NO GROWTH 1 DAY  Final   Report Status PENDING  Incomplete  Culture, blood (routine x 2) Call MD if unable to obtain prior to antibiotics being given     Status: None (Preliminary result)   Collection Time: 06/14/16 12:48 AM  Result Value Ref Range Status   Specimen Description BLOOD RIGHT HAND  Final   Special Requests BOTTLES DRAWN AEROBIC AND ANAEROBIC 5ML  Final   Culture NO GROWTH 1 DAY  Final   Report Status PENDING  Incomplete  Respiratory Panel by PCR     Status: None   Collection Time: 06/14/16  1:20 AM  Result Value Ref Range Status   Adenovirus NOT DETECTED NOT DETECTED Final   Coronavirus 229E NOT DETECTED NOT DETECTED Final   Coronavirus HKU1 NOT DETECTED NOT DETECTED Final   Coronavirus NL63 NOT DETECTED NOT DETECTED  Final   Coronavirus OC43 NOT DETECTED NOT DETECTED Final  Metapneumovirus NOT DETECTED NOT DETECTED Final   Rhinovirus / Enterovirus NOT DETECTED NOT DETECTED Final   Influenza A NOT DETECTED NOT DETECTED Final   Influenza B NOT DETECTED NOT DETECTED Final   Parainfluenza Virus 1 NOT DETECTED NOT DETECTED Final   Parainfluenza Virus 2 NOT DETECTED NOT DETECTED Final   Parainfluenza Virus 3 NOT DETECTED NOT DETECTED Final   Parainfluenza Virus 4 NOT DETECTED NOT DETECTED Final   Respiratory Syncytial Virus NOT DETECTED NOT DETECTED Final   Bordetella pertussis NOT DETECTED NOT DETECTED Final   Chlamydophila pneumoniae NOT DETECTED NOT DETECTED Final   Mycoplasma pneumoniae NOT DETECTED NOT DETECTED Final    Radiology Reports No results found.  Lab Data:  CBC:  Recent Labs Lab 06/13/16 1349 06/16/16 0334  WBC 10.8* 13.3*  HGB 14.2 12.0  HCT 42.5 37.1  MCV 89.3 90.7  PLT 437* 939*   Basic Metabolic Panel:  Recent Labs Lab 06/13/16 1349 06/16/16 0334  NA 140 141  K 3.7 3.9  CL 104 108  CO2 26 25  GLUCOSE 104* 125*  BUN 7 10  CREATININE 0.60 0.52  CALCIUM 9.3 8.7*   GFR: Estimated Creatinine Clearance: 73.2 mL/min (by C-G formula based on SCr of 0.52 mg/dL). Liver Function Tests: No results for input(s): AST, ALT, ALKPHOS, BILITOT, PROT, ALBUMIN in the last 168 hours. No results for input(s): LIPASE, AMYLASE in the last 168 hours. No results for input(s): AMMONIA in the last 168 hours. Coagulation Profile: No results for input(s): INR, PROTIME in the last 168 hours. Cardiac Enzymes: No results for input(s): CKTOTAL, CKMB, CKMBINDEX, TROPONINI in the last 168 hours. BNP (last 3 results) No results for input(s): PROBNP in the last 8760 hours. HbA1C: No results for input(s): HGBA1C in the last 72 hours. CBG: No results for input(s): GLUCAP in the last 168 hours. Lipid Profile: No results for input(s): CHOL, HDL, LDLCALC, TRIG, CHOLHDL, LDLDIRECT in the last  72 hours. Thyroid Function Tests: No results for input(s): TSH, T4TOTAL, FREET4, T3FREE, THYROIDAB in the last 72 hours. Anemia Panel: No results for input(s): VITAMINB12, FOLATE, FERRITIN, TIBC, IRON, RETICCTPCT in the last 72 hours. Urine analysis:    Component Value Date/Time   COLORURINE YELLOW 11/18/2014 1257   APPEARANCEUR CLEAR 11/18/2014 1257   LABSPEC 1.008 11/18/2014 1257   PHURINE 6.5 11/18/2014 1257   GLUCOSEU NEGATIVE 11/18/2014 1257   GLUCOSEU NEGATIVE 07/09/2007 0000   HGBUR NEGATIVE 11/18/2014 1257   BILIRUBINUR NEGATIVE 11/18/2014 1257   KETONESUR NEGATIVE 11/18/2014 1257   PROTEINUR NEGATIVE 11/18/2014 1257   UROBILINOGEN 0.2 11/18/2014 1257   NITRITE NEGATIVE 11/18/2014 1257   LEUKOCYTESUR SMALL (A) 11/18/2014 1257     Yazen Rosko M.D. [facility] 06/16/2016, 12:22 PM  Pager: [phone] Between 7am to 7pm - call Pager - [phone]  After 7pm go to [email or web address] - password [email or web address]  Call night coverage person covering after 7pm

## 2016-06-17 ENCOUNTER — Inpatient Hospital Stay (HOSPITAL_COMMUNITY): Payer: Medicare Other

## 2016-06-17 DIAGNOSIS — J9621 Acute and chronic respiratory failure with hypoxia: Principal | ICD-10-CM

## 2016-06-17 DIAGNOSIS — I509 Heart failure, unspecified: Secondary | ICD-10-CM

## 2016-06-17 DIAGNOSIS — J8410 Pulmonary fibrosis, unspecified: Secondary | ICD-10-CM

## 2016-06-17 DIAGNOSIS — J679 Hypersensitivity pneumonitis due to unspecified organic dust: Secondary | ICD-10-CM

## 2016-06-17 LAB — BASIC METABOLIC PANEL
Anion gap: 9 (ref 5–15)
BUN: 12 mg/dL (ref 6–20)
CO2: 29 mmol/L (ref 22–32)
Calcium: 8.6 mg/dL — ABNORMAL LOW (ref 8.9–10.3)
Chloride: 103 mmol/L (ref 101–111)
Creatinine, Ser: 0.6 mg/dL (ref 0.44–1.00)
GFR calc Af Amer: >60 mL/min (ref >60)
GFR calc non Af Amer: >60 mL/min (ref >60)
Glucose, Bld: 116 mg/dL — ABNORMAL HIGH (ref 65–99)
Potassium: 3.2 mmol/L — ABNORMAL LOW (ref 3.5–5.1)
Sodium: 141 mmol/L (ref 135–145)

## 2016-06-17 LAB — CBC
HCT: 37.3 % (ref 36.0–46.0)
Hemoglobin: 12 g/dL (ref 12.0–15.0)
MCH: 28.6 pg (ref 26.0–34.0)
MCHC: 32.2 g/dL (ref 30.0–36.0)
MCV: 89 fL (ref 78.0–100.0)
Platelets: 439 10*3/uL — ABNORMAL HIGH (ref 150–400)
RBC: 4.19 MIL/uL (ref 3.87–5.11)
RDW: 15.1 % (ref 11.5–15.5)
WBC: 11.8 10*3/uL — ABNORMAL HIGH (ref 4.0–10.5)

## 2016-06-17 LAB — ECHOCARDIOGRAM COMPLETE
Height: 67 in
Weight: 3528 oz

## 2016-06-17 LAB — D-DIMER, QUANTITATIVE: D-Dimer, Quant: 0.57 ug/mL-FEU — ABNORMAL HIGH (ref 0.00–0.50)

## 2016-06-17 MED ORDER — IOPAMIDOL (ISOVUE-370) INJECTION 76%
INTRAVENOUS | Status: AC
  Administered 2016-06-17: 100 mL
  Filled 2016-06-17: qty 100

## 2016-06-17 MED ORDER — POTASSIUM CHLORIDE CRYS ER 20 MEQ PO TBCR
40.0000 meq | EXTENDED_RELEASE_TABLET | Freq: Once | ORAL | Status: AC
  Administered 2016-06-17: 40 meq via ORAL
  Filled 2016-06-17: qty 2

## 2016-06-17 MED ORDER — PANTOPRAZOLE SODIUM 40 MG PO TBEC
40.0000 mg | DELAYED_RELEASE_TABLET | Freq: Every day | ORAL | Status: DC
  Administered 2016-06-17 – 2016-06-19 (×3): 40 mg via ORAL
  Filled 2016-06-17 (×3): qty 1

## 2016-06-17 MED ORDER — GI COCKTAIL ~~LOC~~
30.0000 mL | Freq: Once | ORAL | Status: AC
  Administered 2016-06-17: 30 mL via ORAL
  Filled 2016-06-17: qty 30

## 2016-06-17 MED ORDER — BENZONATATE 100 MG PO CAPS: 100.0000 mg | ORAL_CAPSULE | Freq: Three times a day (TID) | ORAL | Status: DC | PRN

## 2016-06-17 NOTE — Care Management Important Message (Signed)
Important Message  Patient Details  Name: [name] MRN: [id number] Date of Birth: 10-26-1940   Medicare Important Message Given:  Yes    [name] 06/17/2016, 12:34 PM

## 2016-06-17 NOTE — Progress Notes (Signed)
Patient ambulated to nurses station and back to room on 3L oxygen.  Oxygen saturation 96% on 3L oxygen while ambulating.  Patient did get short of breath however recovered well after resting.

## 2016-06-17 NOTE — Progress Notes (Signed)
  Echocardiogram 2D Echocardiogram has been performed.  [name] 06/17/2016, 2:20 PM

## 2016-06-17 NOTE — Progress Notes (Signed)
[facility]                                                                              Patient Demographics  [name], is a 76 y.o. female, DOB - [birth year], QBH:[id number]  Admit date - 06/13/2016   Admitting Physician Ivor Costa, MD  Outpatient Primary MD for the patient is Melinda Crutch, MD  Outpatient specialists:   LOS - 3  days    Chief Complaint  Patient presents with  . Shortness of Breath       Brief summary   Patient is a 76 year old female with pulmonary fibrosis, hypersensitivity pneumonitis, hypertension, GERD, history of rheumatoid arthritis presented with shortness of breath, cough in the past 4 days, progressively getting worse. Patient was seen by PCP last week, no improvement with the Zithromax and nebs outpatient. In ED, O2 sats 86% on room air, improved to 94% on 2 L Patient was seen in [place] clinic on the day of admission, had chest x-ray which was negative for infiltrates. Patient was admitted for acute bronchitis with hypoxia  Assessment & Plan    Principal Problem:  Acute on chronic respiratory failure with hypoxia ([place]): possible due to flareup hypersensitivity pneumonitis/pulmonary fibrosis versus acute bronchitis - Patient placed on scheduled Atrovent and prn Xopenex Nebs - DC'ed IV Solu-Medrol, transitioned to oral prednisone -patient was placed on doxycycline and Rocephin  - Flu PCR negative,Respiratory virus panel negative, urine strep antigen negative - Blood cultures negative so far -Still on O2, unable to wean off, IV fluids were discontinued, continue oral Lasix.Given extra dose of IV Lasix on 1/11 - Home O2 evaluation-> qualifies for 3 L home O2 underlying diagnosis of pulmonary fibrosis - Repeat chest x-ray done today,As patient continues to feel poorly shows chronic interstitial lung disease with slight increase in interstitial markings bilaterally suggesting pneumonitis, stable cardiomegaly - check 2-D  echocardiogram to rule out cor pulmonale or pulmonary hypertension, d-dimer - Pulmonology consulted      HTN (hypertension): -Continue Lasix  History of hypersensitivity pneumonitis, pulmonary fibrosis  - Continue management as #1, qualifies for 3 L home O2  - Outpatient follow-up with Dr. Lake Bells  Code Status: Full CODE STATUS  DVT Prophylaxis:  Lovenox  Family Communication: Discussed in detail with the patient, all imaging results, lab results explained to the patient   Disposition Plan:   Time Spent in minutes  25 minutes  Procedures:   chest x-ray   Consultants:    pulmonology   Antimicrobials:   Doxycycline 1/9  Rocephin 1/9    Medications  Scheduled Meds: . cefTRIAXone (ROCEPHIN)  IV  1 g Intravenous Q24H  . dextromethorphan-guaiFENesin  1 tablet Oral BID  . doxycycline  100 mg Oral Q12H  . enoxaparin (LOVENOX) injection  40 mg Subcutaneous Q24H  . furosemide  20 mg Oral Daily  . gi cocktail  30 mL Oral Once  . levalbuterol  1.25 mg Nebulization TID  . mouth rinse  15 mL Mouth Rinse BID  . pantoprazole  40 mg Oral Q0600  . potassium chloride  40 mEq Oral Once  . predniSONE  60 mg Oral Q breakfast  Continuous Infusions:  PRN Meds:.acetaminophen, alum & mag hydroxide-simeth, hydrALAZINE, ibuprofen, ipratropium, loratadine, ondansetron, zolpidem   Antibiotics   Anti-infectives    Start     Dose/Rate Route Frequency Ordered Stop   06/14/16 2200  cefTRIAXone (ROCEPHIN) 1 g in dextrose 5 % 50 mL IVPB     1 g 100 mL/hr over 30 Minutes Intravenous Every 24 hours 06/13/16 2357     06/14/16 0115  doxycycline (VIBRA-TABS) tablet 100 mg     100 mg Oral Every 12 hours 06/13/16 2357     06/13/16 2345  cefTRIAXone (ROCEPHIN) 1 g in dextrose 5 % 50 mL IVPB     1 g 100 mL/hr over 30 Minutes Intravenous  Once 06/13/16 2334 06/14/16 0108   06/13/16 2345  azithromycin (ZITHROMAX) tablet 500 mg  Status:  Discontinued     500 mg Oral  Once 06/13/16 2334 06/13/16  2353        Subjective:   [name] was seen and examined today. Does not feel too good, still on 3 L O2, coughing, shortness of breath on ambulating.  No chest pain.  Patient denies dizziness, abdominal pain, N/V/D/C, new weakness, numbess, tingling. No acute events overnight.    Objective:   Vitals:   06/16/16 2017 06/16/16 2026 06/17/16 0548 06/17/16 0843  BP:  (!) 152/59 (!) 138/55   Pulse:  82 66   Resp:  18 20   Temp:  98.2 F (36.8 C) 97.7 F (36.5 C)   TempSrc:  Oral Oral   SpO2: 95% 96% 98% 98%  Weight:   100 kg (220 lb 8 oz)   Height:        Intake/Output Summary (Last 24 hours) at 06/17/16 1054 Last data filed at 06/17/16 1027  Gross per 24 hour  Intake              890 ml  Output             4570 ml  Net            -3680 ml     Wt Readings from Last 3 Encounters:  06/17/16 100 kg (220 lb 8 oz)  04/20/16 103.4 kg (228 lb)  09/03/15 103.4 kg (228 lb)     Exam  General: Alert and oriented x 3, NAD  HEENT:   Neck  Cardiovascular: S1 S2 clear, RRR  Respiratory: dec BS at bases   Gastrointestinal: Soft, NT, NBS  Ext: no cyanosis clubbing or edema  Neuro:   Skin: No rashes  Psych: Normal affect and demeanor, alert and oriented x3    Data Reviewed:  I have personally reviewed following labs and imaging studies  Micro Results Recent Results (from the past 240 hour(s))  Culture, blood (routine x 2) Call MD if unable to obtain prior to antibiotics being given     Status: None (Preliminary result)   Collection Time: 06/14/16 12:41 AM  Result Value Ref Range Status   Specimen Description BLOOD RIGHT FOREARM  Final   Special Requests IN PEDIATRIC BOTTLE 4ML  Final   Culture NO GROWTH 2 DAYS  Final   Report Status PENDING  Incomplete  Culture, blood (routine x 2) Call MD if unable to obtain prior to antibiotics being given     Status: None (Preliminary result)   Collection Time: 06/14/16 12:48 AM  Result Value Ref Range Status    Specimen Description BLOOD RIGHT HAND  Final   Special Requests BOTTLES DRAWN AEROBIC AND ANAEROBIC  5ML  Final   Culture NO GROWTH 2 DAYS  Final   Report Status PENDING  Incomplete  Respiratory Panel by PCR     Status: None   Collection Time: 06/14/16  1:20 AM  Result Value Ref Range Status   Adenovirus NOT DETECTED NOT DETECTED Final   Coronavirus 229E NOT DETECTED NOT DETECTED Final   Coronavirus HKU1 NOT DETECTED NOT DETECTED Final   Coronavirus NL63 NOT DETECTED NOT DETECTED Final   Coronavirus OC43 NOT DETECTED NOT DETECTED Final   Metapneumovirus NOT DETECTED NOT DETECTED Final   Rhinovirus / Enterovirus NOT DETECTED NOT DETECTED Final   Influenza A NOT DETECTED NOT DETECTED Final   Influenza B NOT DETECTED NOT DETECTED Final   Parainfluenza Virus 1 NOT DETECTED NOT DETECTED Final   Parainfluenza Virus 2 NOT DETECTED NOT DETECTED Final   Parainfluenza Virus 3 NOT DETECTED NOT DETECTED Final   Parainfluenza Virus 4 NOT DETECTED NOT DETECTED Final   Respiratory Syncytial Virus NOT DETECTED NOT DETECTED Final   Bordetella pertussis NOT DETECTED NOT DETECTED Final   Chlamydophila pneumoniae NOT DETECTED NOT DETECTED Final   Mycoplasma pneumoniae NOT DETECTED NOT DETECTED Final    Radiology Reports Dg Chest 2 View  Result Date: 06/17/2016 CLINICAL DATA:  Hypoxia. EXAM: CHEST  2 VIEW COMPARISON:  09/03/2015.  12/09/2014. FINDINGS: Mediastinum and hilar structures are stable. Stable cardiomegaly. Chronic interstitial prominence is again noted. Mild increase in interstitial markings suggests the possibility of overlying pneumonitis. Interstitial edema could also present in this fashion . Stable right base pleural thickening noted most consistent scarring. No pneumothorax. No acute bony abnormality. Degenerative changes thoracic spine . IMPRESSION: 1. Chronic interstitial lung disease. Slight increase in interstitial markings noted bilaterally suggesting pneumonitis. Interstitial edema  could also present in this fashion. 2. Stable cardiomegaly . Electronically Signed   By: Marcello Moores  Register   On: 06/17/2016 10:47    Lab Data:  CBC:  Recent Labs Lab 06/13/16 1349 06/16/16 0334 06/17/16 0503  WBC 10.8* 13.3* 11.8*  HGB 14.2 12.0 12.0  HCT 42.5 37.1 37.3  MCV 89.3 90.7 89.0  PLT 437* 429* 048*   Basic Metabolic Panel:  Recent Labs Lab 06/13/16 1349 06/16/16 0334 06/17/16 0503  NA 140 141 141  K 3.7 3.9 3.2*  CL 104 108 103  CO2 _0 GLUCOSE 104* 125* 116*  BUN _1 CREATININE 0.60 0.52 0.60  CALCIUM 9.3 8.7* 8.6*   GFR: Estimated Creatinine Clearance: 72.7 mL/min (by C-G formula based on SCr of 0.6 mg/dL). Liver Function Tests: No results for input(s): AST, ALT, ALKPHOS, BILITOT, PROT, ALBUMIN in the last 168 hours. No results for input(s): LIPASE, AMYLASE in the last 168 hours. No results for input(s): AMMONIA in the last 168 hours. Coagulation Profile: No results for input(s): INR, PROTIME in the last 168 hours. Cardiac Enzymes: No results for input(s): CKTOTAL, CKMB, CKMBINDEX, TROPONINI in the last 168 hours. BNP (last 3 results) No results for input(s): PROBNP in the last 8760 hours. HbA1C: No results for input(s): HGBA1C in the last 72 hours. CBG: No results for input(s): GLUCAP in the last 168 hours. Lipid Profile: No results for input(s): CHOL, HDL, LDLCALC, TRIG, CHOLHDL, LDLDIRECT in the last 72 hours. Thyroid Function Tests: No results for input(s): TSH, T4TOTAL, FREET4, T3FREE, THYROIDAB in the last 72 hours. Anemia Panel: No results for input(s): VITAMINB12, FOLATE, FERRITIN, TIBC, IRON, RETICCTPCT in the last 72 hours. Urine analysis:    Component Value Date/Time  COLORURINE YELLOW 11/18/2014 [place] 11/18/2014 1257   LABSPEC 1.008 11/18/2014 1257   PHURINE 6.5 11/18/2014 1257   GLUCOSEU NEGATIVE 11/18/2014 1257   GLUCOSEU NEGATIVE 07/09/2007 0000   HGBUR NEGATIVE 11/18/2014 1257   BILIRUBINUR  NEGATIVE 11/18/2014 1257   [place] 11/18/2014 1257   PROTEINUR NEGATIVE 11/18/2014 1257   UROBILINOGEN 0.2 11/18/2014 1257   NITRITE NEGATIVE 11/18/2014 1257   LEUKOCYTESUR SMALL (A) 11/18/2014 1257     Chrystopher Stangl M.D. [facility] 06/17/2016, 10:54 AM  Pager: [phone] Between 7am to 7pm - call Pager - [phone]  After 7pm go to [email or web address] - password [email or web address]  Call night coverage person covering after 7pm

## 2016-06-17 NOTE — Consult Note (Signed)
Name: [name] MRN: [id number] DOB: 08/11/1940    ADMISSION DATE:  06/13/2016 CONSULTATION DATE:  06/17/16  REFERRING MD : Dr. Tana Coast  CHIEF COMPLAINT:  Dyspnea   HISTORY OF PRESENT ILLNESS: 76 year old female with PMH of allergic rhinitis, GERD, pulmonary fibrosis, hypersensitivity pneumonitis (patient of Dr. Lake Bells), HTN, anxiety, and RA. Presents to ED on 1/8 with cough and increase SOB.   Patient states that in December she developed shingles and ever since her respiratory status has been declining. Since 1/4 she reports coughing up clear mucus, denies fever. On this day she reported to her PCP who placed her on azithromycin and nebulizers with no significant improvement. Currently patient is on 2L [place] with a dry cough, states that her chest tightness has greatly improved however, she is still feeling tired. PCCM was asked to consult.   SIGNIFICANT EVENTS  1/8 > Presents to ED with dyspnea and cough   STUDIES:  CXR 1/12 > Slight increase in interstitial markings noted bilaterally suggesting pneumonitis, interstitial edema could also present in this fashion   PAST MEDICAL HISTORY :   has a past medical history of Allergic rhinitis; Anxiety; Arthritis; Asthma; Carpal tunnel syndrome; GERD (gastroesophageal reflux disease); History of bronchitis (3-66yr ago); History of colon polyps; Joint pain; Peripheral edema; Shortness of breath dyspnea; and Thyroid nodule.  has a past surgical history that includes Umbilical hernia repair; Knee arthroscopy w/ meniscal repair; CARPELL TUNNELL SURG (Right); Total knee arthroplasty (Left, 08/13/2013); Breast cyst excision (Left, 50 yrs ago); Colonoscopy; Esophagogastroduodenoscopy; Video assisted thoracoscopy (Right, 11/20/2014); and Lung biopsy (Right, 11/20/2014). Prior to Admission medications   Medication Sig Start Date End Date Taking? Authorizing Provider  albuterol (PROVENTIL HFA;VENTOLIN HFA) 108 (90 BASE) MCG/ACT inhaler Inhale 2 puffs into  the lungs every 6 (six) hours as needed for wheezing or shortness of breath.    Yes Historical Provider, MD  aspirin 325 MG tablet Take 650 mg by mouth every 6 (six) hours as needed for mild pain or moderate pain.    Yes Historical Provider, MD  azithromycin (ZITHROMAX) 250 MG tablet Take 250 mg by mouth daily.   Yes Historical Provider, MD  furosemide (LASIX) 20 MG tablet Take 20 mg by mouth as needed for fluid.    Yes Historical Provider, MD  ibuprofen (ADVIL,MOTRIN) 200 MG tablet Take 400 mg by mouth every 6 (six) hours as needed for mild pain or moderate pain.    Yes Historical Provider, MD  loratadine (CLARITIN) 5 MG chewable tablet Chew 5 mg by mouth daily as needed for allergies.   Yes Historical Provider, MD   Allergies  Allergen Reactions  . Adhesive [Tape] Itching    Rash  . Sulfonamide Derivatives Other (See Comments)    Unknown    FAMILY HISTORY:  family history includes Tuberculosis in her father and paternal uncle. SOCIAL HISTORY:  reports that she has never smoked. She has never used smokeless tobacco. She reports that she does not drink alcohol or use drugs.  REVIEW OF SYSTEMS:   All negative; except for those that are bolded, which indicate positives.  Constitutional: weight loss, weight gain, night sweats, fevers, chills, fatigue, weakness.  HEENT: headaches, sore throat, sneezing, nasal congestion, post nasal drip, difficulty swallowing, tooth/dental problems, visual complaints, visual changes, ear aches. Neuro: difficulty with speech, weakness, numbness, ataxia. CV:  chest pain, orthopnea, PND, swelling in lower extremities, dizziness, palpitations, syncope.  Resp: cough, hemoptysis, dyspnea, wheezing. GI: heartburn, indigestion, abdominal pain, nausea, vomiting, diarrhea, constipation,  change in bowel habits, loss of appetite, hematemesis, melena, hematochezia.  GU: dysuria, change in color of urine, urgency or frequency, flank pain, hematuria. MSK: joint pain or  swelling, decreased range of motion. Psych: change in mood or affect, depression, anxiety, suicidal ideations, homicidal ideations. Skin: rash, itching, bruising.   SUBJECTIVE:  Resting in bed, no distress, on 2L [place], speaking in full sentences.   VITAL SIGNS: Temp:  [97.7 F (36.5 C)-98.2 F (36.8 C)] 97.7 F (36.5 C) (01/12 0548) Pulse Rate:  [66-85] 66 (01/12 0548) Resp:  [18-20] 20 (01/12 0548) BP: (136-152)/(52-59) 138/55 (01/12 0548) SpO2:  [95 %-99 %] 98 % (01/12 0843) Weight:  [100 kg (220 lb 8 oz)] 100 kg (220 lb 8 oz) (01/12 0548)  PHYSICAL EXAMINATION: General:  Adult female, lying in bed, no distress  Neuro:  Alert, oriented, follows commands  HEENT:  Normocephalic  Cardiovascular:  No MRG, NI s1/s2, RRR Lungs:  Clear, diminished, unlabored  Abdomen:  Obese, non-tender, active bowel sounds  Musculoskeletal:  No deformities  Skin:  Warm ,dry, intact    Recent Labs Lab 06/13/16 1349 06/16/16 0334 06/17/16 0503  NA 140 141 141  K 3.7 3.9 3.2*  CL 104 108 103  CO2 _0 BUN _1 CREATININE 0.60 0.52 0.60  GLUCOSE 104* 125* 116*    Recent Labs Lab 06/13/16 1349 06/16/16 0334 06/17/16 0503  HGB 14.2 12.0 12.0  HCT 42.5 37.1 37.3  WBC 10.8* 13.3* 11.8*  PLT 437* 429* 439*   Dg Chest 2 View  Result Date: 06/17/2016 CLINICAL DATA:  Hypoxia. EXAM: CHEST  2 VIEW COMPARISON:  09/03/2015.  12/09/2014. FINDINGS: Mediastinum and hilar structures are stable. Stable cardiomegaly. Chronic interstitial prominence is again noted. Mild increase in interstitial markings suggests the possibility of overlying pneumonitis. Interstitial edema could also present in this fashion . Stable right base pleural thickening noted most consistent scarring. No pneumothorax. No acute bony abnormality. Degenerative changes thoracic spine . IMPRESSION: 1. Chronic interstitial lung disease. Slight increase in interstitial markings noted bilaterally suggesting pneumonitis.  Interstitial edema could also present in this fashion. 2. Stable cardiomegaly . Electronically Signed   By: Marcello Moores  Register   On: 06/17/2016 10:47    ASSESSMENT / PLAN:  Acute on Chronic Hypoxic Respiratory Failure secondary to hypersensitivity pneumonitis flare  H/O pulmonary fibrosis, hypersensitivity pneumonitis   Negative RVP, urine strep, and Flu PCR. Remains Afebrile with clear sputum. Patient is net negative 1L for stay and on home lasix dose.   -Continue Steroid Taper  -Continue Mucinex and Tessalon PRN -Continue atrovent and xopenex  -Continue doxycycline and rocephin  -Maintain oxygenation >92 -May need to go home with oxygenation  -Will make follow up appointment for one week post-discharge   Hayden Pedro, [facility]  Pgr: [phone]  PCCM Pgr: [phone]

## 2016-06-17 NOTE — Progress Notes (Signed)
Pt refused bed alarm on. Will continue to do hourly rounding.

## 2016-06-18 LAB — CBC
HCT: 37.9 % (ref 36.0–46.0)
Hemoglobin: 12.5 g/dL (ref 12.0–15.0)
MCH: 29.3 pg (ref 26.0–34.0)
MCHC: 33 g/dL (ref 30.0–36.0)
MCV: 89 fL (ref 78.0–100.0)
Platelets: 433 10*3/uL — ABNORMAL HIGH (ref 150–400)
RBC: 4.26 MIL/uL (ref 3.87–5.11)
RDW: 15.3 % (ref 11.5–15.5)
WBC: 10.9 10*3/uL — ABNORMAL HIGH (ref 4.0–10.5)

## 2016-06-18 LAB — BASIC METABOLIC PANEL
Anion gap: 9 (ref 5–15)
BUN: 11 mg/dL (ref 6–20)
CO2: 29 mmol/L (ref 22–32)
Calcium: 8.7 mg/dL — ABNORMAL LOW (ref 8.9–10.3)
Chloride: 101 mmol/L (ref 101–111)
Creatinine, Ser: 0.69 mg/dL (ref 0.44–1.00)
GFR calc Af Amer: >60 mL/min (ref >60)
GFR calc non Af Amer: >60 mL/min (ref >60)
Glucose, Bld: 86 mg/dL (ref 65–99)
Potassium: 3.6 mmol/L (ref 3.5–5.1)
Sodium: 139 mmol/L (ref 135–145)

## 2016-06-18 MED ORDER — PREDNISONE 50 MG PO TABS
50.0000 mg | ORAL_TABLET | Freq: Every day | ORAL | Status: DC
  Administered 2016-06-19: 50 mg via ORAL
  Filled 2016-06-18: qty 1

## 2016-06-18 MED ORDER — PREDNISONE 10 MG PO TABS: ORAL_TABLET | ORAL | 0 refills | Status: DC

## 2016-06-18 NOTE — Discharge Summary (Signed)
Physician Discharge Summary  [name] JGG:[id number] DOB: 1941-01-17 DOA: 06/13/2016  PCP: Melinda Crutch, MD  Admit date: 06/13/2016 Discharge date: 06/18/2016  Admitted From: Home Disposition:  Home  Recommendations for Outpatient Follow-up:  1. Follow up with PCP in 1-2 weeks 2. Follow up with Dr. Lake Bells (Pulm) in 1 week. Will need ambulatory O2 check in office and repeat CXR and PFT 3. Please follow up on the following pending results: final blood culture results, respiratory culture result  Home Health: No  Equipment/Devices: Home O2   Discharge Condition: Stable CODE STATUS: Full  Diet recommendation: Heart healthy   Brief/Interim Summary: From H&P: "[name] is a 76 y.o. female with medical history significant of pulmonary fibrosis, hypersensitivity pneumonitis, peripheral edema, hypertension, GERD, anxiety, rheumatoid arthritis, who presents with cough and SOB. Patient states that she has been having shortness of breath and cough in the past 4 days, which has been progressively getting worse. Patient coughs up clear mucus, no chest pain, fever, but has chills. Patient was seen by PCP on Thursday and given prescription for azithromycin and nebulizers, without significant help. Her symptoms actually have been worsening. Patient denies chest pain, runny nose or sore throat. Patient was seen in [place] [facility] today, and had chest x-ray which was negative for any infiltration. Pt states that she  was also diagnosed with shingles last month. She has not noticed any active lesions or blisters. Patient does not have nausea, vomiting, diarrhea, abdominal pain. No symptoms of UTI or unilateral weakness." Patient was admitted due to acute hypoxemic respiratory failure.   Interim: Pulmonology was consulted during her hospitalization. Patient underwent CT chest which showed increasing groundglass in similar distribution to her baseline fibrotic changes, not consistent with UIP. Patient  also underwent echocardiogram which showed pulmonary hypertension, most likely secondary to chronic fibrotic lung disease. Patient underwent home oxygen desaturation screening, and is discharged with home oxygen as well as prednisone taper.   Subjective on day of discharge: Feeling weak overall, however, doing better in terms of her respiratory complaints. She denies any worsening shortness of breath, feels better with nasal cannula O2, no worsening cough. She denies any chest pain, fevers.  Discharge Diagnoses:  Principal Problem:   Acute on chronic respiratory failure with hypoxia (HCC) Active Problems:   HTN (hypertension)   Interstitial lung disease (HCC)   Interstitial fibrosis (HCC)   Pulmonary fibrosis (HCC)   Hypersensitivity pneumonitis (HCC)   Acute hypoxemic respiratory failure with hypoxia:possible due to flareup hypersensitivity pneumonitis/pulmonary fibrosis versus viral pneumonitis  - Flu PCR negative,Respiratory virus panel negative, urine strep antigen negative - Blood cultures negative so far - Patient placed on scheduled Atrovent and prn Xopenex Nebs - Completed course of doxy and rocephin, no antibiotics at discharge  - Prednisone taper at discharge  - Will require home O2 at 3L  - Outpatient follow-up with Dr. Lake Bells in 1 week   Essential HTN: -Continue Lasix   Discharge Instructions  Discharge Instructions    Call MD for:  difficulty breathing, headache or visual disturbances    Complete by:  As directed    Call MD for:  temperature >100.4    Complete by:  As directed    Diet - low sodium heart healthy    Complete by:  As directed    Increase activity slowly    Complete by:  As directed      Allergies as of 06/18/2016      Reactions   Adhesive [tape] Itching  Rash   Sulfonamide Derivatives Other (See Comments)   Unknown      Medication List    STOP taking these medications   azithromycin 250 MG tablet Commonly known as:  ZITHROMAX      TAKE these medications   albuterol 108 (90 Base) MCG/ACT inhaler Commonly known as:  PROVENTIL HFA;VENTOLIN HFA Inhale 2 puffs into the lungs every 6 (six) hours as needed for wheezing or shortness of breath.   aspirin 325 MG tablet Take 650 mg by mouth every 6 (six) hours as needed for mild pain or moderate pain.   furosemide 20 MG tablet Commonly known as:  LASIX Take 20 mg by mouth as needed for fluid.   ibuprofen 200 MG tablet Commonly known as:  ADVIL,MOTRIN Take 400 mg by mouth every 6 (six) hours as needed for mild pain or moderate pain.   loratadine 5 MG chewable tablet Commonly known as:  CLARITIN Chew 5 mg by mouth daily as needed for allergies.   predniSONE 10 MG tablet Commonly known as:  DELTASONE Take 4 tabs for 3 days, then 3 tabs for 3 days, then 2 tabs for 3 days, then 1 tab for 3 days, then 1/2 tab for 4 days.            Durable Medical Equipment        Start     Ordered   06/17/16 1054  For home use only DME oxygen  Once    Question Answer Comment  Mode or (Route) Nasal cannula   Liters per Minute 3   Frequency Continuous (stationary and portable oxygen unit needed)   Oxygen conserving device Yes   Oxygen delivery system Gas      06/17/16 1054     Follow-up Information    Melinda Crutch, MD. Go on 06/28/2016.   Specialty:  Family Medicine Why:  _0 :15am Contact information: [place] [address] [phone]        Simonne Maffucci, MD. Schedule an appointment as soon as possible for a visit in 1 week(s).   Specialty:  Pulmonary Disease Contact information: [place] [address] [phone]          Allergies  Allergen Reactions  . Adhesive [Tape] Itching    Rash  . Sulfonamide Derivatives Other (See Comments)    Unknown    Consultations:  Pulmonology    Procedures/Studies: Dg Chest 2 View  Result Date: 06/17/2016 CLINICAL DATA:  Hypoxia. EXAM: CHEST  2 VIEW COMPARISON:  09/03/2015.   12/09/2014. FINDINGS: Mediastinum and hilar structures are stable. Stable cardiomegaly. Chronic interstitial prominence is again noted. Mild increase in interstitial markings suggests the possibility of overlying pneumonitis. Interstitial edema could also present in this fashion . Stable right base pleural thickening noted most consistent scarring. No pneumothorax. No acute bony abnormality. Degenerative changes thoracic spine . IMPRESSION: 1. Chronic interstitial lung disease. Slight increase in interstitial markings noted bilaterally suggesting pneumonitis. Interstitial edema could also present in this fashion. 2. Stable cardiomegaly . Electronically Signed   By: Marcello Moores  Register   On: 06/17/2016 10:47   Ct Angio Chest Pe W Or Wo Contrast  Result Date: 06/17/2016 CLINICAL DATA:  Worsening shortness of breath and hypoxia. Usual interstitial pneumonia. EXAM: CT ANGIOGRAPHY CHEST WITH CONTRAST TECHNIQUE: Multidetector CT imaging of the chest was performed using the standard protocol during bolus administration of intravenous contrast. Multiplanar CT image reconstructions and MIPs were obtained to evaluate the vascular anatomy. CONTRAST:  100 mL Isovue  370 COMPARISON:  06/24/2014 FINDINGS: Cardiovascular: Satisfactory opacification of pulmonary arteries noted, and no pulmonary emboli identified. No evidence of thoracic aortic dissection or aneurysm. Mild cardiomegaly. Aortic and coronary artery atherosclerosis. Mediastinum/Nodes: No masses or pathologically enlarged lymph nodes identified. Stable small right thyroid lobe nodule. Lungs/Pleura: Background chronic interstitial lung disease is again demonstrated. Increased bilateral heterogeneous ground-glass opacity is seen, which may be due to superimposed pulmonary edema, or progressive usual interstitial pneumonia. No evidence of focal consolidation or mass. No evidence of pleural effusion. Upper abdomen: Tiny hiatal hernia again noted. Tiny calcified gallstones  again demonstrated. Although gallbladder is incompletely visualized, there are no definite findings of acute cholecystitis. Musculoskeletal: No suspicious bone lesions or other significant abnormality identified. Review of the MIP images confirms the above findings. IMPRESSION: No evidence of pulmonary embolism. Bilateral heterogeneous ground-glass pulmonary opacity superimposed on chronic interstitial lung disease. This may be due to superimposed pulmonary edema or progressive usual interstitial pneumonia. No evidence of pulmonary consolidation, mass, or pleural effusion. Cholelithiasis.  No radiographic evidence of cholecystitis. Aortic and coronary artery atherosclerosis. Electronically Signed   By: Earle Gell M.D.   On: 06/17/2016 15:08    Echo Study Conclusions  - Left ventricle: The cavity size was normal. Systolic function was   normal. The estimated ejection fraction was in the range of 60%   to 65%. Wall motion was normal; there were no regional wall   motion abnormalities. Doppler parameters are consistent with both   elevated ventricular end-diastolic filling pressure and elevated   left atrial filling pressure. - Aortic valve: Valve area (VTI): 1.64 cm^2. Valve area (Vmax):   1.65 cm^2. Valve area (Vmean): 1.84 cm^2. - Mitral valve: Calcified annulus. - Left atrium: The atrium was moderately dilated. - Right atrium: The atrium was mildly dilated. - Atrial septum: There was increased thickness of the septum,   consistent with lipomatous hypertrophy. No defect or patent   foramen ovale was identified. - Tricuspid valve: There was moderate regurgitation. - Pulmonary arteries: PA peak pressure: 55 mm Hg (S).    Discharge Exam: Vitals:   06/18/16 0519 06/18/16 0953  BP: (!) 134/45 (!) 142/39  Pulse: 71 88  Resp: 20   Temp: 97.5 F (36.4 C) 97.6 F (36.4 C)   Vitals:   06/17/16 2046 06/18/16 0519 06/18/16 0929 06/18/16 0953  BP: (!) 130/44 (!) 134/45  (!) 142/39  Pulse:  97 71  88  Resp: 20 20    Temp: 98.2 F (36.8 C) 97.5 F (36.4 C)  97.6 F (36.4 C)  TempSrc: Oral Oral  Oral  SpO2: 92% 95% 96% 96%  Weight:  99.5 kg (219 lb 4.8 oz)    Height:        General: Pt is alert, awake, not in acute distress Cardiovascular: RRR, S1/S2 +, no rubs, no gallops Respiratory: +bibasilar crackles, on [place] O2, no acute respiratory distress, no conversational dyspnea  Abdominal: Soft, NT, ND, bowel sounds + Extremities: no edema, no cyanosis    The results of significant diagnostics from this hospitalization (including imaging, microbiology, ancillary and laboratory) are listed below for reference.     Microbiology: Recent Results (from the past 240 hour(s))  Culture, blood (routine x 2) Call MD if unable to obtain prior to antibiotics being given     Status: None (Preliminary result)   Collection Time: 06/14/16 12:41 AM  Result Value Ref Range Status   Specimen Description BLOOD RIGHT FOREARM  Final   Special Requests IN PEDIATRIC BOTTLE  4ML  Final   Culture NO GROWTH 4 DAYS  Final   Report Status PENDING  Incomplete  Culture, blood (routine x 2) Call MD if unable to obtain prior to antibiotics being given     Status: None (Preliminary result)   Collection Time: 06/14/16 12:48 AM  Result Value Ref Range Status   Specimen Description BLOOD RIGHT HAND  Final   Special Requests BOTTLES DRAWN AEROBIC AND ANAEROBIC 5ML  Final   Culture NO GROWTH 4 DAYS  Final   Report Status PENDING  Incomplete  Respiratory Panel by PCR     Status: None   Collection Time: 06/14/16  1:20 AM  Result Value Ref Range Status   Adenovirus NOT DETECTED NOT DETECTED Final   Coronavirus 229E NOT DETECTED NOT DETECTED Final   Coronavirus HKU1 NOT DETECTED NOT DETECTED Final   Coronavirus NL63 NOT DETECTED NOT DETECTED Final   Coronavirus OC43 NOT DETECTED NOT DETECTED Final   Metapneumovirus NOT DETECTED NOT DETECTED Final   Rhinovirus / Enterovirus NOT DETECTED NOT DETECTED Final    Influenza A NOT DETECTED NOT DETECTED Final   Influenza B NOT DETECTED NOT DETECTED Final   Parainfluenza Virus 1 NOT DETECTED NOT DETECTED Final   Parainfluenza Virus 2 NOT DETECTED NOT DETECTED Final   Parainfluenza Virus 3 NOT DETECTED NOT DETECTED Final   Parainfluenza Virus 4 NOT DETECTED NOT DETECTED Final   Respiratory Syncytial Virus NOT DETECTED NOT DETECTED Final   Bordetella pertussis NOT DETECTED NOT DETECTED Final   Chlamydophila pneumoniae NOT DETECTED NOT DETECTED Final   Mycoplasma pneumoniae NOT DETECTED NOT DETECTED Final     Labs: BNP (last 3 results)  Recent Labs  06/15/16 1336  BNP 790.2*   Basic Metabolic Panel:  Recent Labs Lab 06/13/16 1349 06/16/16 0334 06/17/16 0503 06/18/16 0507  NA 140 141 141 139  K 3.7 3.9 3.2* 3.6  CL 104 108 103 101  CO2 _0 GLUCOSE 104* 125* 116* 86  BUN _1 CREATININE 0.60 0.52 0.60 0.69  CALCIUM 9.3 8.7* 8.6* 8.7*   Liver Function Tests: No results for input(s): AST, ALT, ALKPHOS, BILITOT, PROT, ALBUMIN in the last 168 hours. No results for input(s): LIPASE, AMYLASE in the last 168 hours. No results for input(s): AMMONIA in the last 168 hours. CBC:  Recent Labs Lab 06/13/16 1349 06/16/16 0334 06/17/16 0503 06/18/16 0507  WBC 10.8* 13.3* 11.8* 10.9*  HGB 14.2 12.0 12.0 12.5  HCT 42.5 37.1 37.3 37.9  MCV 89.3 90.7 89.0 89.0  PLT 437* 429* 439* 433*   Cardiac Enzymes: No results for input(s): CKTOTAL, CKMB, CKMBINDEX, TROPONINI in the last 168 hours. BNP: Invalid input(s): POCBNP CBG: No results for input(s): GLUCAP in the last 168 hours. D-Dimer  Recent Labs  06/17/16 1009  DDIMER 0.57*   Hgb A1c No results for input(s): HGBA1C in the last 72 hours. Lipid Profile No results for input(s): CHOL, HDL, LDLCALC, TRIG, CHOLHDL, LDLDIRECT in the last 72 hours. Thyroid function studies No results for input(s): TSH, T4TOTAL, T3FREE, THYROIDAB in the last 72 hours.  Invalid input(s):  FREET3 Anemia work up No results for input(s): VITAMINB12, FOLATE, FERRITIN, TIBC, IRON, RETICCTPCT in the last 72 hours. Urinalysis    Component Value Date/Time   COLORURINE YELLOW 11/18/2014 1257   APPEARANCEUR CLEAR 11/18/2014 1257   LABSPEC 1.008 11/18/2014 1257   PHURINE 6.5 11/18/2014 1257   GLUCOSEU NEGATIVE 11/18/2014 1257   GLUCOSEU NEGATIVE 07/09/2007 0000  HGBUR NEGATIVE 11/18/2014 1257   [place] 11/18/2014 1257   [place] 11/18/2014 1257   PROTEINUR NEGATIVE 11/18/2014 1257   UROBILINOGEN 0.2 11/18/2014 1257   NITRITE NEGATIVE 11/18/2014 1257   LEUKOCYTESUR SMALL (A) 11/18/2014 1257   Sepsis Labs Invalid input(s): PROCALCITONIN,  WBC,  LACTICIDVEN Microbiology Recent Results (from the past 240 hour(s))  Culture, blood (routine x 2) Call MD if unable to obtain prior to antibiotics being given     Status: None (Preliminary result)   Collection Time: 06/14/16 12:41 AM  Result Value Ref Range Status   Specimen Description BLOOD RIGHT FOREARM  Final   Special Requests IN PEDIATRIC BOTTLE 4ML  Final   Culture NO GROWTH 4 DAYS  Final   Report Status PENDING  Incomplete  Culture, blood (routine x 2) Call MD if unable to obtain prior to antibiotics being given     Status: None (Preliminary result)   Collection Time: 06/14/16 12:48 AM  Result Value Ref Range Status   Specimen Description BLOOD RIGHT HAND  Final   Special Requests BOTTLES DRAWN AEROBIC AND ANAEROBIC 5ML  Final   Culture NO GROWTH 4 DAYS  Final   Report Status PENDING  Incomplete  Respiratory Panel by PCR     Status: None   Collection Time: 06/14/16  1:20 AM  Result Value Ref Range Status   Adenovirus NOT DETECTED NOT DETECTED Final   Coronavirus 229E NOT DETECTED NOT DETECTED Final   Coronavirus HKU1 NOT DETECTED NOT DETECTED Final   Coronavirus NL63 NOT DETECTED NOT DETECTED Final   Coronavirus OC43 NOT DETECTED NOT DETECTED Final   Metapneumovirus NOT DETECTED NOT DETECTED  Final   Rhinovirus / Enterovirus NOT DETECTED NOT DETECTED Final   Influenza A NOT DETECTED NOT DETECTED Final   Influenza B NOT DETECTED NOT DETECTED Final   Parainfluenza Virus 1 NOT DETECTED NOT DETECTED Final   Parainfluenza Virus 2 NOT DETECTED NOT DETECTED Final   Parainfluenza Virus 3 NOT DETECTED NOT DETECTED Final   Parainfluenza Virus 4 NOT DETECTED NOT DETECTED Final   Respiratory Syncytial Virus NOT DETECTED NOT DETECTED Final   Bordetella pertussis NOT DETECTED NOT DETECTED Final   Chlamydophila pneumoniae NOT DETECTED NOT DETECTED Final   Mycoplasma pneumoniae NOT DETECTED NOT DETECTED Final     Time coordinating discharge: Over 30 minutes  SIGNED:  Dessa Phi, DO [facility] Pager [phone]  If 7PM-7AM, please contact night-coverage [email or web address] Password [email or web address] 06/18/2016, 10:40 AM

## 2016-06-18 NOTE — Progress Notes (Signed)
SATURATION QUALIFICATIONS: (This note is used to comply with regulatory documentation for home oxygen)  Patient Saturations on Room Air at Rest =92%  Patient Saturations on Room Air while Ambulating =85%  Patient Saturations on 2 Liters of oxygen while Ambulating =97%  Please briefly explain why patient needs home oxygen: Patient have shortness of breath and feel tired/weak, denies CP. Will continue to monitor the patient.

## 2016-06-18 NOTE — Progress Notes (Signed)
Name: [name] MRN: [id number] DOB: [birth year]    ADMISSION DATE:  06/13/2016 CONSULTATION DATE:  06/17/16  REFERRING MD : Dr. Tana Coast  CHIEF COMPLAINT:  Dyspnea   HISTORY OF PRESENT ILLNESS: 76 year old female with PMH of allergic rhinitis, GERD, pulmonary fibrosis, hypersensitivity pneumonitis (patient of Dr. Lake Bells), HTN, anxiety, and RA. Presents to ED on 1/8 with cough and increase SOB.   Patient states that in December she developed shingles and ever since her respiratory status has been declining. Since 1/4 she reports coughing up clear mucus, denies fever. On this day she reported to her PCP who placed her on azithromycin and nebulizers with no significant improvement. Currently patient is on 2L [place] with a dry cough, states that her chest tightness has greatly improved however, she is still feeling tired. PCCM was asked to consult.   SIGNIFICANT EVENTS  1/8 > Presents to ED with dyspnea and cough   STUDIES:  CXR 1/12 > Slight increase in interstitial markings noted bilaterally suggesting pneumonitis, interstitial edema could also present in this fashion   SUBJECTIVE:  Up in chair. Family here. She describes continued weakness. Admits she is not able to walk from room to room in home and felt quite limited on ambulation with nurse this morning. She blames residual from shingles on face leaving her quite weak.   VITAL SIGNS: Temp:  [97.5 F (36.4 C)-98.2 F (36.8 C)] 97.6 F (36.4 C) (01/13 0953) Pulse Rate:  [71-97] 88 (01/13 0953) Resp:  [20] 20 (01/13 0519) BP: (116-142)/(39-66) 142/39 (01/13 0953) SpO2:  [92 %-99 %] 96 % (01/13 0953) Weight:  [99.5 kg (219 lb 4.8 oz)] 99.5 kg (219 lb 4.8 oz) (01/13 0519)  PHYSICAL EXAMINATION: General:  Adult female, napping in chair, woke to voice, no distress  Neuro:  Alert, oriented, follows commands  HEENT:  Normocephalic  Cardiovascular:  No MRG, NI s1/s2, RRR Lungs:  Fine crackles in bases to mid back, unlabored on O2    Abdomen:  Obese, non-tender,  bowel sounds present Musculoskeletal:  No deformities  Skin:  Warm ,dry, intact    Recent Labs Lab 06/16/16 0334 06/17/16 0503 06/18/16 0507  NA 141 141 139  K 3.9 3.2* 3.6  CL 108 103 101  CO2 _0 BUN _1 CREATININE 0.52 0.60 0.69  GLUCOSE 125* 116* 86    Recent Labs Lab 06/16/16 0334 06/17/16 0503 06/18/16 0507  HGB 12.0 12.0 12.5  HCT 37.1 37.3 37.9  WBC 13.3* 11.8* 10.9*  PLT 429* 439* 433*   Dg Chest 2 View  Result Date: 06/17/2016 CLINICAL DATA:  Hypoxia. EXAM: CHEST  2 VIEW COMPARISON:  09/03/2015.  12/09/2014. FINDINGS: Mediastinum and hilar structures are stable. Stable cardiomegaly. Chronic interstitial prominence is again noted. Mild increase in interstitial markings suggests the possibility of overlying pneumonitis. Interstitial edema could also present in this fashion . Stable right base pleural thickening noted most consistent scarring. No pneumothorax. No acute bony abnormality. Degenerative changes thoracic spine . IMPRESSION: 1. Chronic interstitial lung disease. Slight increase in interstitial markings noted bilaterally suggesting pneumonitis. Interstitial edema could also present in this fashion. 2. Stable cardiomegaly . Electronically Signed   By: Marcello Moores  Register   On: 06/17/2016 10:47   Ct Angio Chest Pe W Or Wo Contrast  Result Date: 06/17/2016 CLINICAL DATA:  Worsening shortness of breath and hypoxia. Usual interstitial pneumonia. EXAM: CT ANGIOGRAPHY CHEST WITH CONTRAST TECHNIQUE: Multidetector CT imaging of the chest was performed using  the standard protocol during bolus administration of intravenous contrast. Multiplanar CT image reconstructions and MIPs were obtained to evaluate the vascular anatomy. CONTRAST:  100 mL Isovue 370 COMPARISON:  06/24/2014 FINDINGS: Cardiovascular: Satisfactory opacification of pulmonary arteries noted, and no pulmonary emboli identified. No evidence of thoracic aortic dissection  or aneurysm. Mild cardiomegaly. Aortic and coronary artery atherosclerosis. Mediastinum/Nodes: No masses or pathologically enlarged lymph nodes identified. Stable small right thyroid lobe nodule. Lungs/Pleura: Background chronic interstitial lung disease is again demonstrated. Increased bilateral heterogeneous ground-glass opacity is seen, which may be due to superimposed pulmonary edema, or progressive usual interstitial pneumonia. No evidence of focal consolidation or mass. No evidence of pleural effusion. Upper abdomen: Tiny hiatal hernia again noted. Tiny calcified gallstones again demonstrated. Although gallbladder is incompletely visualized, there are no definite findings of acute cholecystitis. Musculoskeletal: No suspicious bone lesions or other significant abnormality identified. Review of the MIP images confirms the above findings. IMPRESSION: No evidence of pulmonary embolism. Bilateral heterogeneous ground-glass pulmonary opacity superimposed on chronic interstitial lung disease. This may be due to superimposed pulmonary edema or progressive usual interstitial pneumonia. No evidence of pulmonary consolidation, mass, or pleural effusion. Cholelithiasis.  No radiographic evidence of cholecystitis. Aortic and coronary artery atherosclerosis. Electronically Signed   By: Earle Gell M.D.   On: 06/17/2016 15:08    ASSESSMENT / PLAN:  Acute on Chronic Hypoxic Respiratory Failure secondary to hypersensitivity pneumonitis flare  H/O pulmonary fibrosis, hypersensitivity pneumonitis   Negative RVP, urine strep, and Flu PCR. Remains Afebrile with clear sputum. Patient is net negative  for stay and on home lasix dose.   -Continue Steroid Taper  -Continue Mucinex and Tessalon PRN -Continue atrovent and xopenex  -Continue doxycycline and rocephin  -Maintain oxygenation >92 -May need to go home with oxygen   Suggest PT/OT eval and treat for weakness  follow up appointment for one week post-discharge    CD Annamaria Boots, MD  Pulmonary & Critical Care  Pgr: [phone]    After 3:00 PM PCCM Pgr: [phone]

## 2016-06-18 NOTE — Progress Notes (Signed)
Patient discharged this morning. I received call from RN regarding discharge. Patient stating that she cannot go home as she continues to be weak, short of breath. Will ask PT/OT to evaluate and possibly discharge tomorrow.   Dessa Phi, DO [facility] [email or web address] Password [email or web address] 06/18/2016, 4:07 PM

## 2016-06-18 NOTE — Care Management Note (Signed)
Case Management Note  Patient Details  Name: [name] MRN: [id number] Date of Birth: 09/16/1940  Subjective/Objective:                  shortness of breath Action/Plan: Discharge planning Expected Discharge Date:  06/18/16               Expected Discharge Plan:  Home/Self Care  In-House Referral:  NA  Discharge planning Services  NA  Post Acute Care Choice:    Choice offered to:     DME Arranged:  Oxygen DME Agency:  [place] Arranged:  NA [place] Agency:  NA  Status of Service:  Completed, signed off  If discussed at [place] of Stay Meetings, dates discussed:    Additional Comments: Cm received call from RN to please arrange for home O2. CM called [facility] rep, Jermaine to please secure authorization for home O2. CM notified [place] [facility] rep, Reggie to please deliver the transportaiton tank of O2 to room so pt can discharge.  No other CM needs were communicated.  Dellie Catholic, RN 06/18/2016, 1:09 PM

## 2016-06-19 LAB — CULTURE, BLOOD (ROUTINE X 2)
Culture: NO GROWTH
Culture: NO GROWTH

## 2016-06-19 MED ORDER — HYDROXYZINE HCL 10 MG PO TABS: 10.0000 mg | ORAL_TABLET | Freq: Three times a day (TID) | ORAL | 0 refills | Status: DC | PRN

## 2016-06-19 MED ORDER — ALBUTEROL SULFATE (2.5 MG/3ML) 0.083% IN NEBU: 2.5000 mg | INHALATION_SOLUTION | Freq: Four times a day (QID) | RESPIRATORY_TRACT | 0 refills | Status: DC | PRN

## 2016-06-19 MED ORDER — BENZONATATE 100 MG PO CAPS: 100.0000 mg | ORAL_CAPSULE | Freq: Three times a day (TID) | ORAL | 0 refills | Status: DC | PRN

## 2016-06-19 MED ORDER — DM-GUAIFENESIN ER 30-600 MG PO TB12: 1.0000 | ORAL_TABLET | Freq: Two times a day (BID) | ORAL | 0 refills | Status: DC

## 2016-06-19 MED ORDER — DOXYCYCLINE HYCLATE 100 MG PO TABS: 100.0000 mg | ORAL_TABLET | Freq: Two times a day (BID) | ORAL | 0 refills | Status: AC

## 2016-06-19 NOTE — Progress Notes (Signed)
Pt refused bed alarm on. Will continue to do hourly rounding.

## 2016-06-19 NOTE — Progress Notes (Signed)
SATURATION QUALIFICATIONS: (This note is used to comply with regulatory documentation for home oxygen)  Patient Saturations on Room Air at Rest = 92%  Patient Saturations on Room Air while Ambulating = 84%  Patient Saturations on 2 Liters of oxygen while Ambulating = 93%  Please briefly explain why patient needs home oxygen:desaturates with ambulation  Alben Deeds, [place] DPT [phone]

## 2016-06-19 NOTE — Progress Notes (Signed)
Patient alert and oriented, denies pain.iv and tele d/c, instruction explain and given to the patient and son, all questions answered, family and pt. Verbalized understanding. D/c pt. Home with oxygen per order.

## 2016-06-19 NOTE — Evaluation (Signed)
Physical Therapy Evaluation Patient Details Name: [name] MRN: [id number] DOB: Jan 06, 1941 Today's Date: 06/19/2016   History of Present Illness  Pt adm with Acute on chronic respiratory failure with hypoxia. PMH - pulmonary fibrosis, hypersensitivity pneumonitis, hypertension, GERD, history of rheumatoid arthritis, recent shingles  Clinical Impression  Patient seen for re-evaluation s/p decline in activity tolerance. Patient mobilizing well but does demonstrate some generalized weakness and decreased endurance. Will benefit from skilled PT to address deficits and maximize funciton. Recommend HHPT upon acute discharge.    Follow Up Recommendations Home health PT    Equipment Recommendations  None recommended by PT    Recommendations for Other Services       Precautions / Restrictions Precautions Precautions: Other (comment) Precaution Comments: watch SpO2 Restrictions Weight Bearing Restrictions: No      Mobility  Bed Mobility                  Transfers Overall transfer level: Independent                  Ambulation/Gait Ambulation/Gait assistance: Modified independent (Device/Increase time) Ambulation Distance (Feet): 120 Feet Assistive device: None Gait Pattern/deviations: WFL(Within Functional Limits)   Gait velocity interpretation: Below normal speed for age/gender General Gait Details: modest instability, increased DOE noted 3/4 with saturations dropping to 84% on room air  Stairs            Wheelchair Mobility    Modified Rankin (Stroke Patients Only)       Balance Overall balance assessment: No apparent balance deficits (not formally assessed)                                           Pertinent Vitals/Pain Pain Assessment: No/denies pain    Home Living Family/patient expects to be discharged to:: Private residence Living Arrangements: Alone     Home Access: Stairs to enter Entrance Stairs-Rails:  None Entrance Stairs-Number of Steps: 1 Home Layout: One level Home Equipment: Environmental consultant - 2 wheels      Prior Function Level of Independence: Independent               Hand Dominance   Dominant Hand: Right    Extremity/Trunk Assessment   Upper Extremity Assessment Upper Extremity Assessment: Overall WFL for tasks assessed    Lower Extremity Assessment Lower Extremity Assessment: Generalized weakness       Communication   Communication: No difficulties  Cognition Arousal/Alertness: Awake/alert Behavior During Therapy: WFL for tasks assessed/performed Overall Cognitive Status: Within Functional Limits for tasks assessed                      General Comments      Exercises     Assessment/Plan    PT Assessment Patient needs continued PT services  PT Problem List Decreased strength;Decreased activity tolerance;Decreased balance;Decreased mobility;Cardiopulmonary status limiting activity          PT Treatment Interventions DME instruction;Gait training;Stair training;Therapeutic activities;Therapeutic exercise;Balance training;Functional mobility training;Patient/family education    PT Goals (Current goals can be found in the Care Plan section)  Acute Rehab PT Goals Patient Stated Goal: to get stronger PT Goal Formulation: With patient Time For Goal Achievement: 07/03/16 Potential to Achieve Goals: Good    Frequency Min 3X/week   Barriers to discharge        Co-evaluation  End of Session Equipment Utilized During Treatment: Oxygen Activity Tolerance: Patient limited by fatigue Patient left: in bed Nurse Communication: Mobility status;Other (comment) (decr SpO2)         Time: 3329-5188 PT Time Calculation (min) (ACUTE ONLY): 18 min   Charges:   PT Evaluation $PT Re-evaluation: 1 Procedure     PT G Codes:        Duncan Dull June 30, 2016, 10:35 AM Alben Deeds, PT DPT  [phone]

## 2016-06-19 NOTE — Care Management Note (Signed)
Case Management Note  Patient Details  Name: MARTHE DANT MRN: [id number] Date of Birth: 1940/10/03  Subjective/Objective:                  respiratory failure with hypoxia. Action/Plan: dish planning Expected Discharge Date:  06/19/16               Expected Discharge Plan:  [place]  In-House Referral:  NA  Discharge planning Services  CM Consult  Post Acute Care Choice:  Durable Medical Equipment, Home Health Choice offered to:  Patient, Adult Children  DME Arranged:  Oxygen DME Agency:  [place] Arranged:  PT, RN [facility] Agency:  [place]  Status of Service:  Completed, signed off  If discussed at [place] of Stay Meetings, dates discussed:    Additional Comments: CM met with pt who has home O2 tank in room for [place] home (transportation by son in room), to offer choice of home health agency. Pt chooses [facility] to render HHPT/RN. Referral called to [facility] rep, Jermaine.  No other CM needs were communicated. Dellie Catholic, RN 06/19/2016, 12:44 PM

## 2016-06-20 DIAGNOSIS — Z9181 History of falling: Secondary | ICD-10-CM | POA: Diagnosis not present

## 2016-06-20 DIAGNOSIS — Z9981 Dependence on supplemental oxygen: Secondary | ICD-10-CM | POA: Diagnosis not present

## 2016-06-20 DIAGNOSIS — J8410 Pulmonary fibrosis, unspecified: Secondary | ICD-10-CM | POA: Diagnosis not present

## 2016-06-20 DIAGNOSIS — M069 Rheumatoid arthritis, unspecified: Secondary | ICD-10-CM | POA: Diagnosis not present

## 2016-06-20 DIAGNOSIS — F419 Anxiety disorder, unspecified: Secondary | ICD-10-CM | POA: Diagnosis not present

## 2016-06-20 DIAGNOSIS — K219 Gastro-esophageal reflux disease without esophagitis: Secondary | ICD-10-CM | POA: Diagnosis not present

## 2016-06-20 DIAGNOSIS — J9611 Chronic respiratory failure with hypoxia: Secondary | ICD-10-CM | POA: Diagnosis not present

## 2016-06-20 DIAGNOSIS — J45909 Unspecified asthma, uncomplicated: Secondary | ICD-10-CM | POA: Diagnosis not present

## 2016-06-20 DIAGNOSIS — I10 Essential (primary) hypertension: Secondary | ICD-10-CM | POA: Diagnosis not present

## 2016-06-20 DIAGNOSIS — Z7951 Long term (current) use of inhaled steroids: Secondary | ICD-10-CM | POA: Diagnosis not present

## 2016-06-20 DIAGNOSIS — Z7952 Long term (current) use of systemic steroids: Secondary | ICD-10-CM | POA: Diagnosis not present

## 2016-06-20 LAB — LEGIONELLA PNEUMOPHILA SEROGP 1 UR AG: L. pneumophila Serogp 1 Ur Ag: NEGATIVE

## 2016-06-21 DIAGNOSIS — J45909 Unspecified asthma, uncomplicated: Secondary | ICD-10-CM | POA: Diagnosis not present

## 2016-06-21 DIAGNOSIS — F419 Anxiety disorder, unspecified: Secondary | ICD-10-CM | POA: Diagnosis not present

## 2016-06-21 DIAGNOSIS — M069 Rheumatoid arthritis, unspecified: Secondary | ICD-10-CM | POA: Diagnosis not present

## 2016-06-21 DIAGNOSIS — J9611 Chronic respiratory failure with hypoxia: Secondary | ICD-10-CM | POA: Diagnosis not present

## 2016-06-21 DIAGNOSIS — J8410 Pulmonary fibrosis, unspecified: Secondary | ICD-10-CM | POA: Diagnosis not present

## 2016-06-21 DIAGNOSIS — I10 Essential (primary) hypertension: Secondary | ICD-10-CM | POA: Diagnosis not present

## 2016-06-23 ENCOUNTER — Inpatient Hospital Stay: Payer: Self-pay | Admitting: Acute Care

## 2016-06-24 DIAGNOSIS — J45909 Unspecified asthma, uncomplicated: Secondary | ICD-10-CM | POA: Diagnosis not present

## 2016-06-24 DIAGNOSIS — J9611 Chronic respiratory failure with hypoxia: Secondary | ICD-10-CM | POA: Diagnosis not present

## 2016-06-24 DIAGNOSIS — J8410 Pulmonary fibrosis, unspecified: Secondary | ICD-10-CM | POA: Diagnosis not present

## 2016-06-24 DIAGNOSIS — F419 Anxiety disorder, unspecified: Secondary | ICD-10-CM | POA: Diagnosis not present

## 2016-06-24 DIAGNOSIS — M069 Rheumatoid arthritis, unspecified: Secondary | ICD-10-CM | POA: Diagnosis not present

## 2016-06-24 DIAGNOSIS — I10 Essential (primary) hypertension: Secondary | ICD-10-CM | POA: Diagnosis not present

## 2016-06-28 DIAGNOSIS — J45909 Unspecified asthma, uncomplicated: Secondary | ICD-10-CM | POA: Diagnosis not present

## 2016-06-28 DIAGNOSIS — J8410 Pulmonary fibrosis, unspecified: Secondary | ICD-10-CM | POA: Diagnosis not present

## 2016-06-29 DIAGNOSIS — I10 Essential (primary) hypertension: Secondary | ICD-10-CM | POA: Diagnosis not present

## 2016-06-29 DIAGNOSIS — M069 Rheumatoid arthritis, unspecified: Secondary | ICD-10-CM | POA: Diagnosis not present

## 2016-06-29 DIAGNOSIS — F419 Anxiety disorder, unspecified: Secondary | ICD-10-CM | POA: Diagnosis not present

## 2016-06-29 DIAGNOSIS — J8410 Pulmonary fibrosis, unspecified: Secondary | ICD-10-CM | POA: Diagnosis not present

## 2016-06-29 DIAGNOSIS — J9611 Chronic respiratory failure with hypoxia: Secondary | ICD-10-CM | POA: Diagnosis not present

## 2016-06-29 DIAGNOSIS — J45909 Unspecified asthma, uncomplicated: Secondary | ICD-10-CM | POA: Diagnosis not present

## 2016-07-04 DIAGNOSIS — I10 Essential (primary) hypertension: Secondary | ICD-10-CM | POA: Diagnosis not present

## 2016-07-04 DIAGNOSIS — F419 Anxiety disorder, unspecified: Secondary | ICD-10-CM | POA: Diagnosis not present

## 2016-07-04 DIAGNOSIS — J8410 Pulmonary fibrosis, unspecified: Secondary | ICD-10-CM | POA: Diagnosis not present

## 2016-07-04 DIAGNOSIS — J45909 Unspecified asthma, uncomplicated: Secondary | ICD-10-CM | POA: Diagnosis not present

## 2016-07-04 DIAGNOSIS — J9611 Chronic respiratory failure with hypoxia: Secondary | ICD-10-CM | POA: Diagnosis not present

## 2016-07-04 DIAGNOSIS — M069 Rheumatoid arthritis, unspecified: Secondary | ICD-10-CM | POA: Diagnosis not present

## 2016-07-06 ENCOUNTER — Ambulatory Visit (INDEPENDENT_AMBULATORY_CARE_PROVIDER_SITE_OTHER)
Admission: RE | Admit: 2016-07-06 | Discharge: 2016-07-06 | Disposition: A | Discharge status: Home / Self Care | Payer: Medicare Other | Source: Ambulatory Visit | Attending: Internal Medicine | Admitting: Internal Medicine

## 2016-07-06 ENCOUNTER — Encounter: Payer: Self-pay | Admitting: Acute Care

## 2016-07-06 ENCOUNTER — Ambulatory Visit (INDEPENDENT_AMBULATORY_CARE_PROVIDER_SITE_OTHER): Payer: Medicare Other | Admitting: Acute Care

## 2016-07-06 VITALS — BP 158/76 | HR 82 | Ht 62.0 in | Wt 223.0 lb

## 2016-07-06 DIAGNOSIS — J45909 Unspecified asthma, uncomplicated: Secondary | ICD-10-CM | POA: Diagnosis not present

## 2016-07-06 DIAGNOSIS — R0602 Shortness of breath: Secondary | ICD-10-CM

## 2016-07-06 DIAGNOSIS — F419 Anxiety disorder, unspecified: Secondary | ICD-10-CM | POA: Diagnosis not present

## 2016-07-06 DIAGNOSIS — J8410 Pulmonary fibrosis, unspecified: Secondary | ICD-10-CM

## 2016-07-06 DIAGNOSIS — J849 Interstitial pulmonary disease, unspecified: Secondary | ICD-10-CM

## 2016-07-06 DIAGNOSIS — J9621 Acute and chronic respiratory failure with hypoxia: Secondary | ICD-10-CM

## 2016-07-06 DIAGNOSIS — J679 Hypersensitivity pneumonitis due to unspecified organic dust: Secondary | ICD-10-CM

## 2016-07-06 DIAGNOSIS — J9611 Chronic respiratory failure with hypoxia: Secondary | ICD-10-CM | POA: Diagnosis not present

## 2016-07-06 DIAGNOSIS — I272 Pulmonary hypertension, unspecified: Secondary | ICD-10-CM

## 2016-07-06 DIAGNOSIS — I10 Essential (primary) hypertension: Secondary | ICD-10-CM | POA: Diagnosis not present

## 2016-07-06 DIAGNOSIS — M069 Rheumatoid arthritis, unspecified: Secondary | ICD-10-CM | POA: Diagnosis not present

## 2016-07-06 DIAGNOSIS — R5381 Other malaise: Secondary | ICD-10-CM

## 2016-07-06 NOTE — Progress Notes (Signed)
History of Present Illness [name] is a 76 y.o. female with pulmonary fibrosis and  Hypersensitivity  pneumonitis diagnosed by thorascopic lung biopsy in June 2016. She is a former patient of Dr. Gwenette Greet. She is currently followed by Dr. Lake Bells   Synopsis:  Former patient of Dr. 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 As summarized by Dr. Gwenette Greet: CT chest 2008:  Mild bronchiectasis, ?NSIP: CXR 05/2014:  Progressive IS changes compared to 2012. 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  HRCT 2016:  Changes c/w fibrotic NSIP?  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 Dorothyann Peng at [facility] in August 2016, felt to have chronic hypersensitivity pneumonitis 02/2015 6 Min Walk: 1218 feet. The patient's lowest oxygen saturation was 90 %, highest heart rate was 118 bpm , and highest blood pressure was 180/80 March 2017 pulmonary function testing ratio 88%, FVC 2.03 L (84% predicted), total lung capacity 3.0 L (54% predicted), DLCO 14.7 (51% predicted)   07/06/2016 Hospital Follow Up: Pt. Presents for hospital follow up. Pt was admitted from 06/13/2016-06/18/2016 for worsening progressive dyspnea and cough of 4 days duration despite initiation of Azithromycin and nebulizer treatments per her PCP 06/09/2016.In the ED she denied purulent secretions, chest pain,or fever, but endorsed chills. CXR done at [facility] office 06/09/2016 was negative for infiltrates. CXR on admission  Was read as Chronic interstitial lung disease. Slight increase in interstitial  markings noted bilaterally suggesting pneumonitis. Interstitial edema could also present in this fashion. Cardiomegaly was stable. Pulmonary Embolism was ruled out per CT Angio 06/17/2016, which also noted Bilateral heterogeneous ground-glass pulmonary opacity superimposed on chronic interstitial lung disease possibly due to superimposed pulmonary edema or progressive usual interstitial pneumonia.  She was admitted to the hospital and Pulmonary was consulted. She was treated with a steroid taper, Mucinex,Tessalon Perles, and scheduled atrovent and Xopenex. Antibiotic therapy was initiated with Doxycycline and rocephin, and Oxygen for saturations > 92%.Blood Cultures and Respiratory panel were negative . The patient believes this exacerbation was caused by a recent episode of Shingles in December 2017.She was discharged on home oxygen and a prednisone taper. She is most anxious to get rid of the oxygen.She is currently wearing her 3 L [place].She states she is monitoring her saturations at home.She does note that she continues to get winded with activity. She is compliant with her albuterol nebs twice daily, and is compliant with her home lasix. She will complete her prednisone taper 07/07/2016. She endorses clear secretions with occasional yellow tinge to her nasal secretions.She is not using her Mucinex , Claritin, Flonase or Tessalon Perles. She states she does not like taking medication every day. We discussed that she has a chronic lung disease and maintenance medications will help control her symptoms. She will need repeat PFT's, but she needs to return to baseline status prior to doing this. She is getting home PT to address deconditioning.She is requesting a referral to Pulmonary Rehab.Marland KitchenSaturations  On room air at rest  Today were  94%., with ambulation she desaturated to 84%. She rebounded to 92% on  2 L.She will continue wearing her oxygen with exertion at 2 L [place]. I asked her if she has daytime sleepiness as she  is obese, to evaluate whether we should assess her for OSA. She states she sleeps well, and does not have any daytime sleepiness.   Tests Echo 06/17/2016 EF 60-65% Doppler parameters are consistent with both elevated ventricular end-diastolic filling pressure and elevated left atrial filling pressure. Mitral valve: Calcified annulus. - Left atrium: The atrium was moderately dilated. - Right atrium: The atrium was mildly dilated. - Atrial septum: There was increased thickness of the septum,   consistent with lipomatous hypertrophy. No defect or patent   foramen ovale was identified. - Tricuspid valve: There was moderate regurgitation. - Pulmonary arteries: PA peak pressure: 55 mm Hg (S).  CXR 07/06/2016 IMPRESSION: Cardiomegaly without evidence of acute cardiopulmonary disease. Diffuse interstitial lung disease again noted.  CT Angio 06/17/2016: IMPRESSION: No evidence of pulmonary embolism.  Bilateral heterogeneous ground-glass pulmonary opacity superimposed on chronic interstitial lung disease. This may be due to superimposed pulmonary edema or progressive usual interstitial pneumonia.  No evidence of pulmonary consolidation, mass, or pleural effusion.   Past medical hx Past Medical History:  Diagnosis Date  . Allergic rhinitis    takes Claritin nightly  . Anxiety    but not on any meds  . Arthritis   . Asthma   . Carpal tunnel syndrome   . GERD (gastroesophageal reflux disease)   . History of bronchitis 3-60yr ago  . History of colon polyps    benign  . Joint pain   . Peripheral edema    takes Lasix daily  . Shortness of breath dyspnea    with exertion;Albuterol inhaler as needed  . Thyroid nodule      Past surgical hx, Family hx, Social hx all reviewed.  Current Outpatient Prescriptions on File Prior to Visit  Medication Sig  . albuterol (PROVENTIL HFA;VENTOLIN HFA) 108 (90 BASE) MCG/ACT inhaler Inhale 2 puffs into the lungs every 6 (six) hours as  needed for wheezing or shortness of breath.   .Marland Kitchenalbuterol (PROVENTIL) (2.5 MG/3ML) 0.083% nebulizer solution Take 3 mLs (2.5 mg total) by nebulization every 6 (six) hours as needed for wheezing or shortness of breath.  .Marland Kitchenaspirin 325 MG tablet Take 650 mg by mouth every 6 (six) hours as needed for mild pain or moderate pain.   .Marland Kitchendextromethorphan-guaiFENesin (MUCINEX DM) 30-600 MG 12hr tablet Take 1 tablet by mouth 2 (two) times daily.  . furosemide (LASIX) 20 MG tablet Take 20 mg by mouth as needed for fluid.   .Marland Kitchenibuprofen (ADVIL,MOTRIN) 200 MG tablet Take 400 mg by mouth every 6 (six) hours as needed for mild pain or moderate pain.   .Marland Kitchenloratadine (CLARITIN) 5 MG chewable tablet Chew 5 mg by mouth daily as needed for allergies.  . predniSONE (DELTASONE) 10 MG tablet Take 4 tabs for 3 days, then 3 tabs for 3 days, then 2 tabs for 3 days, then 1 tab for 3 days, then 1/2 tab for 4 days.  . hydrOXYzine (ATARAX/VISTARIL) 10 MG tablet Take 1 tablet (10 mg total) by mouth 3 (three) times daily as needed for anxiety. (Patient not taking: Reported on 07/06/2016)   No current facility-administered medications on file prior to visit.      Allergies  Allergen Reactions  . Adhesive [Tape] Itching    Rash  . Sulfonamide Derivatives Other (See Comments)    Unknown    Review Of Systems:  Constitutional:   No  weight loss, night sweats,  Fevers, chills, fatigue, or  lassitude.  HEENT:   No headaches,  Difficulty swallowing,  Tooth/dental problems, or  Sore throat,                No sneezing, itching, ear ache, nasal congestion,+ post nasal drip,   CV:  No chest pain,  Orthopnea, PND, swelling in lower extremities, anasarca, dizziness, palpitations, syncope.   GI  No heartburn, indigestion, abdominal pain, nausea, vomiting, diarrhea, change in bowel habits, loss of appetite, bloody stools.   Resp: + shortness of breath with exertion not at rest.  No excess mucus, no productive cough,  No non-productive  cough,  No coughing up of blood.  No change in color of mucus.  No wheezing.  No chest wall deformity  Skin: no rash or lesions.  GU: no dysuria, change in color of urine, no urgency or frequency.  No flank pain, no hematuria   MS:  No joint pain or swelling.  No decreased range of motion.  No back pain.  Psych:  No change in mood or affect. No depression or anxiety.  No memory loss.   Vital Signs BP (!) 158/76 (BP Location: Left Arm, Cuff Size: Normal)   Pulse 82   Ht _0  (1.575 m)   Wt 223 lb (101.2 kg)   SpO2 96%   BMI 40.79 kg/m   Body mass index is 40.79 kg/m.  Physical Exam:  General- No distress,  A&Ox3, obese, pleasant, speaks in full sentences ENT: No sinus tenderness, TM clear, pale nasal mucosa, no oral exudate,+ post nasal drip, no LAN Cardiac: S1, S2, regular rate and rhythm, no murmur Chest: No wheeze/ + fine  crackles/ no  dullness; no accessory muscle use, no nasal flaring, no sternal retractions Abd.: Soft Non-tender, obese, BS+ Ext: No clubbing cyanosis, edema Neuro:  normal strength Skin: No rashes, warm and dry Psych: normal mood and behavior, very talkative   Assessment/Plan  Interstitial lung disease (HCC) Recent Flare requiring hospitalization 06/2016 x 6 days Pt. Believes flare due to recent Shingles event Discharged home on oxygen Plan: We will order a humidifier for your home oxygen  We will order a POC for easier management of your oxygen Claritin 5 mg daily Complete your prednisone tomorrow Add Flonase 2 puffs in each nostril once daily. Continue to use your neb treatments twice daily as needed for shortness of breath of wheezing. Referral to Pulmonary Rehab ( Dr. Lake Bells) We will walk you in the office today to see if you still need your oxygen. Continue wearing oxygen at 2L with exertion. Saturation goal is > 92% Follow up appointment with Dr. Lake Bells or Eric Form, NP in 2 months Please contact office for sooner follow up if  symptoms do not improve or worsen or seek emergency care    Pulmonary hypertension Pulmonary Hypertension per Echo 06/2016 Pt. Adverse to taking daily medications Plan: Ongoing evaluation Consider treatment on follow up with Dr. Lake Bells in 8 weeks.   Physical deconditioning Ongoing home PT Referral for Pulmonary Rehab today  Acute on chronic respiratory failure with hypoxia ([place]) Oxygen on hospital discharge 06/2016 for hypoxemia  Pt. Anxious to have oxygen d/c'd Plan: Ambulating saturations in office 07/06/2016 Patient Saturations on Room Air at Rest = 94% Patient Saturations on Room Air while Ambulating = 84% Patient Saturations on 2 Liters of oxygen while Ambulating = 92% Continue wearing oxygen with exertion with saturation goals of > 92%  Magdalen Spatz, NP 07/06/2016  8:17 PM

## 2016-07-06 NOTE — Patient Instructions (Addendum)
It is nice to meet you today. We will order a humidifier for your home oxygen  We will order a POC for easier management of your oxygen Claritin 5 mg daily Complete your prednisone tomorrow Add Flonase 2 puffs in each nostril once daily. Continue to use your neb treatments twice daily as needed for shortness of breath of wheezing. Referral to Pulmonary Rehab ( Dr. Lake Bells) We will walk you in the office today to see if you still need your oxygen. Continue wearing oxygen at 2L with exertion. Saturation goal is > 92% Follow up appointment with Dr. Lake Bells or Eric Form, NP in 2 months Please contact office for sooner follow up if symptoms do not improve or worsen or seek emergency care

## 2016-07-06 NOTE — Assessment & Plan Note (Signed)
Ongoing home PT Referral for Pulmonary Rehab today

## 2016-07-06 NOTE — Assessment & Plan Note (Signed)
Recent Flare requiring hospitalization 06/2016 x 6 days Pt. Believes flare due to recent Shingles event Discharged home on oxygen Plan: We will order a humidifier for your home oxygen  We will order a POC for easier management of your oxygen Claritin 5 mg daily Complete your prednisone tomorrow Add Flonase 2 puffs in each nostril once daily. Continue to use your neb treatments twice daily as needed for shortness of breath of wheezing. Referral to Pulmonary Rehab ( Dr. Lake Bells) We will walk you in the office today to see if you still need your oxygen. Continue wearing oxygen at 2L with exertion. Saturation goal is > 92% Follow up appointment with Dr. Lake Bells or Eric Form, NP in 2 months Please contact office for sooner follow up if symptoms do not improve or worsen or seek emergency care

## 2016-07-06 NOTE — Assessment & Plan Note (Signed)
Oxygen on hospital discharge 06/2016 for hypoxemia  Pt. Anxious to have oxygen d/c'd Plan: Ambulating saturations in office 07/06/2016 Patient Saturations on Room Air at Rest = 94% Patient Saturations on Room Air while Ambulating = 84% Patient Saturations on 2 Liters of oxygen while Ambulating = 92% Continue wearing oxygen with exertion with saturation goals of > 92%

## 2016-07-06 NOTE — Assessment & Plan Note (Signed)
Pulmonary Hypertension per Echo 06/2016 Pt. Adverse to taking daily medications Plan: Ongoing evaluation Consider treatment on follow up with Dr. Lake Bells in 8 weeks.

## 2016-07-07 ENCOUNTER — Telehealth: Payer: Self-pay | Admitting: Pulmonary Disease

## 2016-07-07 NOTE — Telephone Encounter (Signed)
Notes Recorded by Deneise Lever, MD on 07/06/2016 at 4:23 PM EST Dr Lake Bells patient- CXR- no acute process. Heart remains enlarged without heart failure. Persistent fibrotic scarring. ----------------------------------------------------- Spoke with pt. She is aware of results. Nothing further was needed.

## 2016-07-08 DIAGNOSIS — I10 Essential (primary) hypertension: Secondary | ICD-10-CM | POA: Diagnosis not present

## 2016-07-08 DIAGNOSIS — J9611 Chronic respiratory failure with hypoxia: Secondary | ICD-10-CM | POA: Diagnosis not present

## 2016-07-08 DIAGNOSIS — F419 Anxiety disorder, unspecified: Secondary | ICD-10-CM | POA: Diagnosis not present

## 2016-07-08 DIAGNOSIS — J8410 Pulmonary fibrosis, unspecified: Secondary | ICD-10-CM | POA: Diagnosis not present

## 2016-07-08 DIAGNOSIS — M069 Rheumatoid arthritis, unspecified: Secondary | ICD-10-CM | POA: Diagnosis not present

## 2016-07-08 DIAGNOSIS — J45909 Unspecified asthma, uncomplicated: Secondary | ICD-10-CM | POA: Diagnosis not present

## 2016-07-11 NOTE — Progress Notes (Signed)
Reviewed, agree

## 2016-07-18 ENCOUNTER — Encounter (HOSPITAL_COMMUNITY): Payer: Self-pay

## 2016-07-18 ENCOUNTER — Encounter (HOSPITAL_COMMUNITY)
Admission: RE | Admit: 2016-07-18 | Discharge: 2016-07-18 | Disposition: A | Discharge status: Home / Self Care | Payer: Medicare Other | Source: Ambulatory Visit | Attending: Pulmonary Disease | Admitting: Pulmonary Disease

## 2016-07-18 VITALS — BP 153/59 | HR 83 | Ht 67.0 in | Wt 227.5 lb

## 2016-07-18 DIAGNOSIS — J8410 Pulmonary fibrosis, unspecified: Secondary | ICD-10-CM | POA: Diagnosis not present

## 2016-07-18 DIAGNOSIS — H01001 Unspecified blepharitis right upper eyelid: Secondary | ICD-10-CM | POA: Diagnosis not present

## 2016-07-18 DIAGNOSIS — J679 Hypersensitivity pneumonitis due to unspecified organic dust: Secondary | ICD-10-CM | POA: Diagnosis not present

## 2016-07-18 DIAGNOSIS — H2513 Age-related nuclear cataract, bilateral: Secondary | ICD-10-CM | POA: Diagnosis not present

## 2016-07-18 DIAGNOSIS — H01004 Unspecified blepharitis left upper eyelid: Secondary | ICD-10-CM | POA: Diagnosis not present

## 2016-07-18 NOTE — Progress Notes (Signed)
[name] 76 y.o. female Pulmonary Rehab Orientation Note Patient arrived today in Cardiac and Pulmonary Rehab for orientation to Pulmonary Rehab. She was transported from [employer] via wheel chair. She does carry portable oxygen. Per pt, she uses oxygen continuously. Color good, skin warm and dry. Patient is oriented to time and place. Patient's medical history, psychosocial health, and medications reviewed. Psychosocial assessment reveals pt lives alone. Pt is currently retired. Pt hobbies include geneology, involvement with her grandchildren and her church. Pt reports her stress level is moderate. Areas of stress/anxiety include now learning to live with oxygen and the lifestyle adjustment that it requires..  Pt does notexhibit signs of depression. PHQ2/9 score 0/0. Pt shows good  coping skills with positive outlook . She has a very supportive family as well.  She presently has no psychosocial issues, but this will be reevaluated every 30 days. Physical assessment reveals heart rate is normal, breath sounds mild inspiratory wheezing heard RUL. Grip strength equal, strong. Distal pulses 2+ bilateral posterior tibial pulses present with 2+ ankle edema.  We discussed her use of Lasix which has been prescribed for her.  She does not take it as prescribed due to the inconvenience of the diuretic effect. Patient reports she does take her other medications appropriately and as prescribed. Patient states she follows a Low Sodium diet. The patient reports no specific efforts to gain or lose weight.. Patient's weight will be monitored closely. Demonstration and practice of PLB using pulse oximeter. Patient able to return demonstration satisfactorily. Safety and hand hygiene in the exercise area reviewed with patient. Patient voices understanding of the information reviewed. Department expectations discussed with patient and achievable goals were set. The patient shows enthusiasm about attending the program  and we look forward to working with this nice lady. The patient is scheduled for a 6 min walk test on Thursday, July 21, 2016 at 4 pm and to begin exercise on Thursday, July 28, 2016 in the 1030 class.   [id number]

## 2016-07-20 DIAGNOSIS — J9611 Chronic respiratory failure with hypoxia: Secondary | ICD-10-CM | POA: Diagnosis not present

## 2016-07-20 DIAGNOSIS — J45909 Unspecified asthma, uncomplicated: Secondary | ICD-10-CM | POA: Diagnosis not present

## 2016-07-20 DIAGNOSIS — F419 Anxiety disorder, unspecified: Secondary | ICD-10-CM | POA: Diagnosis not present

## 2016-07-20 DIAGNOSIS — M069 Rheumatoid arthritis, unspecified: Secondary | ICD-10-CM | POA: Diagnosis not present

## 2016-07-20 DIAGNOSIS — I10 Essential (primary) hypertension: Secondary | ICD-10-CM | POA: Diagnosis not present

## 2016-07-20 DIAGNOSIS — J8410 Pulmonary fibrosis, unspecified: Secondary | ICD-10-CM | POA: Diagnosis not present

## 2016-07-21 ENCOUNTER — Encounter (HOSPITAL_COMMUNITY)
Admission: RE | Admit: 2016-07-21 | Discharge: 2016-07-21 | Disposition: A | Discharge status: Home / Self Care | Payer: Medicare Other | Source: Ambulatory Visit | Attending: Pulmonary Disease | Admitting: Pulmonary Disease

## 2016-07-21 DIAGNOSIS — J679 Hypersensitivity pneumonitis due to unspecified organic dust: Secondary | ICD-10-CM | POA: Diagnosis not present

## 2016-07-21 DIAGNOSIS — J8410 Pulmonary fibrosis, unspecified: Secondary | ICD-10-CM | POA: Diagnosis not present

## 2016-07-21 NOTE — Progress Notes (Signed)
Pulmonary Individual Treatment Plan  Patient Details  Name: [name] MRN: [id number] Date of Birth: Sep 19, 1940 Referring Provider:   April Manson Pulmonary Rehab Walk Test from 07/21/2016 in [place]  Referring Provider  Dr. Lake Bells      Initial Encounter Date:  Flowsheet Row Pulmonary Rehab Walk Test from 07/21/2016 in [place]  Date  07/14/16  Referring Provider  Dr. 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  albuterol (PROVENTIL HFA;VENTOLIN HFA) 108 (90 BASE) MCG/ACT inhaler, Inhale 2 puffs into the lungs every 6 (six) hours as needed for wheezing or shortness of breath. , Disp: , Rfl:  .  albuterol (PROVENTIL) (2.5 MG/3ML) 0.083% nebulizer solution, Take 3 mLs (2.5 mg total) by nebulization every 6 (six) hours as needed for wheezing or shortness of breath., Disp: 75 mL, Rfl: 0 .  aspirin 325 MG tablet, Take 650 mg by mouth every 6 (six) hours as needed for mild pain or moderate pain. , Disp: , Rfl:  .  dextromethorphan-guaiFENesin (MUCINEX DM) 30-600 MG 12hr tablet, Take 1 tablet by mouth 2 (two) times daily. (Patient not taking: Reported on 07/18/2016), Disp: 14 tablet, Rfl: 0 .  furosemide (LASIX) 20 MG tablet, Take 20 mg by mouth as needed for fluid. , Disp: , Rfl:  .  hydrOXYzine (ATARAX/VISTARIL) 10 MG tablet, Take 1 tablet (10 mg total) by mouth 3 (three) times daily as needed for anxiety. (Patient not taking: Reported on 07/06/2016), Disp: 30 tablet, Rfl: 0 .  ibuprofen (ADVIL,MOTRIN) 200 MG tablet, Take 400 mg by mouth every 6 (six) hours as needed for mild pain or moderate pain. , Disp: , Rfl:  .  loratadine (CLARITIN) 5 MG chewable tablet, Chew 5 mg by mouth daily as needed for allergies., Disp: , Rfl:  .  predniSONE (DELTASONE) 10 MG tablet,  Take 4 tabs for 3 days, then 3 tabs for 3 days, then 2 tabs for 3 days, then 1 tab for 3 days, then 1/2 tab for 4 days. (Patient not taking: Reported on 07/18/2016), Disp: 32 tablet, Rfl: 0  Past Medical History: Past Medical History:  Diagnosis Date  . Allergic rhinitis    takes Claritin nightly  . Anxiety    but not on any meds  . Arthritis   . Asthma   . Carpal tunnel syndrome   . GERD (gastroesophageal reflux disease)   . History of bronchitis 3-11yr ago  . History of colon polyps    benign  . Joint pain   . Peripheral edema    takes Lasix daily  . Shortness of breath dyspnea    with exertion;Albuterol inhaler as needed  . Thyroid nodule     Tobacco Use: History  Smoking Status  . Never Smoker  Smokeless Tobacco  . Never Used    Labs: Recent Review Flowsheet Data    Labs for ITP Cardiac and Pulmonary Rehab Latest Ref Rng & Units 11/18/2014 11/21/2014   PHART 7.350 - 7.450 7.426 7.356   PCO2ART 35.0 - 45.0 mmHg 40.7 49.1(H)   HCO3 20.0 - 24.0 mEq/L 26.3(H) 26.8(H)   TCO2 0 - 100 mmol/L 27.5 28.4   O2SAT % 96.8 96.2      Capillary Blood Glucose: No results found for: GLUCAP   ADL UCSD:   Pulmonary Function Assessment:  Pulmonary Function Assessment - 07/18/16 1337      Breath   Bilateral Breath Sounds Wheezes;Clear   Shortness of Breath Limiting activity;Yes      Exercise Target Goals: Date: 07/14/16  Exercise Program Goal: Individual exercise prescription set with THRR, safety & activity barriers. Participant demonstrates ability to understand and report RPE using BORG scale, to self-measure pulse accurately, and to acknowledge the importance of the exercise prescription.  Exercise Prescription Goal: Starting with aerobic activity 30 plus minutes a day, 3 days per week for initial exercise prescription. Provide home exercise prescription and guidelines that participant acknowledges understanding prior to discharge.  Activity Barriers & Risk  Stratification:     Activity Barriers & Cardiac Risk Stratification - 07/18/16 1335      Activity Barriers & Cardiac Risk Stratification   Activity Barriers Arthritis;Left Knee Replacement;Shortness of Breath;Deconditioning;Muscular Weakness      6 Minute Walk:     6 Minute Walk    Row Name 07/21/16 1622         6 Minute Walk   Phase Initial     Distance 1320 feet     Walk Time 6 minutes     # of Rest Breaks 0     MPH 2.5     METS 2.91     RPE 13     Perceived Dyspnea  3     Symptoms No     Resting HR 86 bpm     Resting BP 130/70     Max Ex. HR 126 bpm     Max Ex. BP 182/66     2 Minute Post BP 138/80       Interval HR   Baseline HR 86     1 Minute HR 95     2 Minute HR 118     3 Minute HR 120     4 Minute HR 124     5 Minute HR 122     6 Minute HR 126     2 Minute Post HR 95     Interval Heart Rate? Yes       Interval Oxygen   Interval Oxygen? Yes     Baseline Oxygen Saturation % 95 %     Baseline Liters of Oxygen 3 L     1 Minute Oxygen Saturation % 91 %     1 Minute Liters of Oxygen 3 L     2 Minute Oxygen Saturation % 90 %     2 Minute Liters of Oxygen 3 L     3 Minute Oxygen Saturation % 90 %     3 Minute Liters of Oxygen 3 L     4 Minute Oxygen Saturation % 90 %     4 Minute Liters of Oxygen 3 L     5 Minute Oxygen Saturation % 88 %     5 Minute Liters of Oxygen 3 L     6 Minute Oxygen Saturation % 88 %     6 Minute Liters of Oxygen 3 L     2 Minute Post Oxygen Saturation % 98 %     2 Minute Post Liters of Oxygen 3 L        Initial Exercise Prescription:     Initial Exercise Prescription - 07/21/16 1600      Date of Initial Exercise RX and Referring Provider   Date 07/14/16   Referring Provider Dr. Lake Bells     Oxygen   Oxygen  Continuous   Liters 3     NuStep   Level 2   Minutes 17   METs 1.5     Arm Ergometer   Level 1   Minutes 17     Track   Laps 8   Minutes 17     Prescription Details   Frequency (times per week) 2    Duration Progress to 45 minutes of aerobic exercise without signs/symptoms of physical distress     Intensity   THRR 40-80% of Max Heartrate 58-115   Ratings of Perceived Exertion 11-13   Perceived Dyspnea 0-4     Progression   Progression Continue progressive overload as per policy without signs/symptoms or physical distress.     Resistance Training   Training Prescription Yes   Weight blue bands   Reps 10-12      Perform Capillary Blood Glucose checks as needed.  Exercise Prescription Changes:   Exercise Comments:   Discharge Exercise Prescription (Final Exercise Prescription Changes):    Nutrition:  Target Goals: Understanding of nutrition guidelines, daily intake of sodium <1573m, cholesterol <2045m calories 30% from fat and 7% or less from saturated fats, daily to have 5 or more servings of fruits and vegetables.  Biometrics:     Pre Biometrics - 07/18/16 1339      Pre Biometrics   Grip Strength 33 kg       Nutrition Therapy Plan and Nutrition Goals:   Nutrition Discharge: Rate Your Plate Scores:   Psychosocial: Target Goals: Acknowledge presence or absence of depression, maximize coping skills, provide positive support system. Participant is able to verbalize types and ability to use techniques and skills needed for reducing stress and depression.  Initial Review & Psychosocial Screening:     Initial Psych Review & Screening - 07/18/16 1341      Initial Review   Current issues with --  none identified     Family Dynamics   Good Support System? Yes     Barriers   Psychosocial barriers to participate in program There are no identifiable barriers or psychosocial needs.     Screening Interventions   Interventions Encouraged to exercise      Quality of Life Scores:   PHQ-9: Recent Review Flowsheet Data    Dep[facility] 07/18/2016 02/16/2015   Decreased Interest 0 0   Down, Depressed, Hopeless 0 0   PHQ - 2 Score 0 0       Psychosocial Evaluation and Intervention:     Psychosocial Evaluation - 07/18/16 1342      Psychosocial Evaluation & Interventions   Interventions Encouraged to exercise with the program and follow exercise prescription   Continued Psychosocial Services Needed No      Psychosocial Re-Evaluation:  Education: Education Goals: Education classes will be provided on a weekly basis, covering required topics. Participant will state understanding/return demonstration of topics presented.  Learning Barriers/Preferences:     Learning Barriers/Preferences - 07/18/16 1337      Learning Barriers/Preferences   Learning Barriers None   Learning Preferences Audio;Group Instruction;Individual Instruction;Pictoral;Skilled Demonstration;Verbal Instruction      Education Topics: Risk Factor Reduction:  -Group instruction that is supported by a PowerPoint presentation. Instructor discusses the definition of a risk factor, different risk factors for pulmonary disease, and how the heart and lungs work together.     Nutrition for Pulmonary Patient:  -Group instruction provided by PowerPoint slides, verbal discussion, and written materials to support subject matter. The instructor gives an explanation  and review of healthy diet recommendations, which includes a discussion on weight management, recommendations for fruit and vegetable consumption, as well as protein, fluid, caffeine, fiber, sodium, sugar, and alcohol. Tips for eating when patients are short of breath are discussed.   Pursed Lip Breathing:  -Group instruction that is supported by demonstration and informational handouts. Instructor discusses the benefits of pursed lip and diaphragmatic breathing and detailed demonstration on how to preform both.     Oxygen Safety:  -Group instruction provided by PowerPoint, verbal discussion, and written material to support subject matter. There is an overview of "What is Oxygen" and "Why do we  need it".  Instructor also reviews how to create a safe environment for oxygen use, the importance of using oxygen as prescribed, and the risks of noncompliance. There is a brief discussion on traveling with oxygen and resources the patient may utilize.   Oxygen Equipment:  -Group instruction provided by [facility] Staff utilizing handouts, written materials, and equipment demonstrations.   Signs and Symptoms:  -Group instruction provided by written material and verbal discussion to support subject matter. Warning signs and symptoms of infection, stroke, and heart attack are reviewed and when to call the physician/911 reinforced. Tips for preventing the spread of infection discussed.   Advanced Directives:  -Group instruction provided by verbal instruction and written material to support subject matter. Instructor reviews Advanced Directive laws and proper instruction for filling out document.   Pulmonary Video:  -Group video education that reviews the importance of medication and oxygen compliance, exercise, good nutrition, pulmonary hygiene, and pursed lip and diaphragmatic breathing for the pulmonary patient.   Exercise for the Pulmonary Patient:  -Group instruction that is supported by a PowerPoint presentation. Instructor discusses benefits of exercise, core components of exercise, frequency, duration, and intensity of an exercise routine, importance of utilizing pulse oximetry during exercise, safety while exercising, and options of places to exercise outside of rehab.     Pulmonary Medications:  -Verbally interactive group education provided by instructor with focus on inhaled medications and proper administration.   Anatomy and Physiology of the Respiratory System and Intimacy:  -Group instruction provided by PowerPoint, verbal discussion, and written material to support subject matter. Instructor reviews respiratory cycle and anatomical components of the respiratory system and  their functions. Instructor also reviews differences in obstructive and restrictive respiratory diseases with examples of each. Intimacy, Sex, and Sexuality differences are reviewed with a discussion on how relationships can change when diagnosed with pulmonary disease. Common sexual concerns are reviewed.   Knowledge Questionnaire Score:   Core Components/Risk Factors/Patient Goals at Admission:     Personal Goals and Risk Factors at Admission - 07/18/16 1339      Core Components/Risk Factors/Patient Goals on Admission   Increase Strength and Stamina Yes   Improve shortness of breath with ADL's Yes   Develop more efficient breathing techniques such as purse lipped breathing and diaphragmatic breathing; and practicing self-pacing with activity Yes   Increase knowledge of respiratory medications and ability to use respiratory devices properly  Yes      Core Components/Risk Factors/Patient Goals Review:      Goals and Risk Factor Review    Row Name 07/18/16 1340             Core Components/Risk Factors/Patient Goals Review   Personal Goals Review Increase Strength and Stamina;Improve shortness of breath with ADL's;Develop more efficient breathing techniques such as purse lipped breathing and diaphragmatic breathing and practicing self-pacing with activity.;Increase knowledge  of respiratory medications and ability to use respiratory devices properly.          Core Components/Risk Factors/Patient Goals at Discharge (Final Review):      Goals and Risk Factor Review - 07/18/16 1340      Core Components/Risk Factors/Patient Goals Review   Personal Goals Review Increase Strength and Stamina;Improve shortness of breath with ADL's;Develop more efficient breathing techniques such as purse lipped breathing and diaphragmatic breathing and practicing self-pacing with activity.;Increase knowledge of respiratory medications and ability to use respiratory devices properly.      ITP  Comments:   Comments:

## 2016-07-28 ENCOUNTER — Encounter (HOSPITAL_COMMUNITY)
Admission: RE | Admit: 2016-07-28 | Discharge: 2016-07-28 | Disposition: A | Discharge status: Home / Self Care | Payer: Medicare Other | Source: Ambulatory Visit | Attending: Pulmonary Disease | Admitting: Pulmonary Disease

## 2016-07-28 VITALS — Wt 226.6 lb

## 2016-07-28 DIAGNOSIS — J8410 Pulmonary fibrosis, unspecified: Secondary | ICD-10-CM | POA: Diagnosis not present

## 2016-07-28 DIAGNOSIS — J679 Hypersensitivity pneumonitis due to unspecified organic dust: Secondary | ICD-10-CM | POA: Diagnosis not present

## 2016-07-28 NOTE — Progress Notes (Signed)
Daily Session Note  Patient Details  Name: [name] MRN: [id number] Date of Birth: Jan 21, 1941 Referring Provider:   April Manson Pulmonary Rehab Walk Test from 07/21/2016 in [place]  Referring Provider  Dr. Lake Bells      Encounter Date: 07/28/2016  Check In:     Session Check In - 07/28/16 1101      Check-In   Location [facility]   Staff Present Su Hilt, MS, ACSM RCEP, Exercise Physiologist;Lisa Ysidro Evert, RN;Amair Shrout Rollene Rotunda, RN, BSN   Supervising physician immediately available to respond to emergencies [facility] immediately available   Physician(s) Dr. Tana Coast   Medication changes reported     No   Fall or balance concerns reported    No   Warm-up and Cool-down Performed as group-led instruction   Resistance Training Performed Yes   VAD Patient? No     Pain Assessment   Currently in Pain? No/denies   Multiple Pain Sites No      Capillary Blood Glucose: No results found for this or any previous visit (from the past 24 hour(s)).      Exercise Prescription Changes - 07/28/16 1240      Response to Exercise   Blood Pressure (Admit) 148/69   Blood Pressure (Exercise) 150/60   Blood Pressure (Exit) 140/62   Heart Rate (Admit) 95 bpm   Heart Rate (Exercise) 113 bpm   Heart Rate (Exit) 84 bpm   Oxygen Saturation (Admit) 98 %   Oxygen Saturation (Exercise) 95 %   Oxygen Saturation (Exit) 97 %   Rating of Perceived Exertion (Exercise) 13   Perceived Dyspnea (Exercise) 2   Duration Progress to 45 minutes of aerobic exercise without signs/symptoms of physical distress   Intensity Other (comment)  40-80 HRR     Progression   Progression Continue to progress workloads to maintain intensity without signs/symptoms of physical distress.     Resistance Training   Training Prescription Yes   Weight blue bands   Reps 10-12     Oxygen   Oxygen Continuous   Liters 3     Arm Ergometer   Level 1   Minutes 17     Track   Laps --  pt did not count steps   Minutes 17     Goals Met:  Exercise tolerated well No report of cardiac concerns or symptoms Strength training completed today  Goals Unmet:  Not Applicable  Comments: Service time is from 1030 to 1215   Dr. Rush Farmer is Medical Director for Pulmonary Rehab at [facility].

## 2016-08-02 ENCOUNTER — Encounter (HOSPITAL_COMMUNITY)
Admission: RE | Admit: 2016-08-02 | Discharge: 2016-08-02 | Disposition: A | Discharge status: Home / Self Care | Payer: Medicare Other | Source: Ambulatory Visit | Attending: Pulmonary Disease | Admitting: Pulmonary Disease

## 2016-08-02 VITALS — Wt 228.6 lb

## 2016-08-02 DIAGNOSIS — J8410 Pulmonary fibrosis, unspecified: Secondary | ICD-10-CM

## 2016-08-02 DIAGNOSIS — J679 Hypersensitivity pneumonitis due to unspecified organic dust: Secondary | ICD-10-CM | POA: Diagnosis not present

## 2016-08-02 NOTE — Progress Notes (Signed)
Daily Session Note  Patient Details  Name: [name] MRN: [id number] Date of Birth: [birth year] Referring Provider:   April Manson Pulmonary Rehab Walk Test from 07/21/2016 in [place]  Referring Provider  Dr. Lake Bells      Encounter Date: 08/02/2016  Check In:     Session Check In - 08/02/16 1031      Check-In   Location [facility]   Staff Present Rosebud Poles, RN, BSN;Molly diVincenzo, MS, ACSM RCEP, Exercise Physiologist;Caiden Arteaga Ysidro Evert, RN;Portia Rollene Rotunda, RN, BSN   Supervising physician immediately available to respond to emergencies [facility] immediately available   Physician(s) Dr. Posey Pronto   Medication changes reported     No   Fall or balance concerns reported    No   Tobacco Cessation No Change   Warm-up and Cool-down Performed as group-led instruction   Resistance Training Performed Yes   VAD Patient? No     Pain Assessment   Currently in Pain? No/denies   Multiple Pain Sites No      Capillary Blood Glucose: No results found for this or any previous visit (from the past 24 hour(s)).      Exercise Prescription Changes - 08/02/16 1200      Response to Exercise   Blood Pressure (Admit) 124/60   Blood Pressure (Exercise) 138/52   Blood Pressure (Exit) 126/60   Heart Rate (Admit) 84 bpm   Heart Rate (Exercise) 107 bpm   Heart Rate (Exit) 80 bpm   Oxygen Saturation (Admit) 100 %   Oxygen Saturation (Exercise) 93 %   Oxygen Saturation (Exit) 98 %   Rating of Perceived Exertion (Exercise) 13   Perceived Dyspnea (Exercise) 2   Duration Progress to 45 minutes of aerobic exercise without signs/symptoms of physical distress   Intensity THRR unchanged     Progression   Progression Continue to progress workloads to maintain intensity without signs/symptoms of physical distress.     Resistance Training   Training Prescription Yes   Weight blue bands   Reps 10-15   Time 10 Minutes     Oxygen   Oxygen Continuous   Liters 3     Recumbant Bike   Level 2   Minutes 17     NuStep   Level 2   Minutes 17   METs 1.7     Track   Laps 11   Minutes 17      History  Smoking Status  . Never Smoker  Smokeless Tobacco  . Never Used    Goals Met:  Exercise tolerated well No report of cardiac concerns or symptoms Strength training completed today  Goals Unmet:  Not Applicable  Comments: Service time is from 1030 to 1215    Dr. Rush Farmer is Medical Director for Pulmonary Rehab at [facility].

## 2016-08-04 ENCOUNTER — Encounter (HOSPITAL_COMMUNITY)
Admission: RE | Admit: 2016-08-04 | Discharge: 2016-08-04 | Disposition: A | Discharge status: Home / Self Care | Payer: Medicare Other | Source: Ambulatory Visit | Attending: Pulmonary Disease | Admitting: Pulmonary Disease

## 2016-08-04 VITALS — Wt 229.3 lb

## 2016-08-04 DIAGNOSIS — J8410 Pulmonary fibrosis, unspecified: Secondary | ICD-10-CM | POA: Diagnosis not present

## 2016-08-04 DIAGNOSIS — J679 Hypersensitivity pneumonitis due to unspecified organic dust: Secondary | ICD-10-CM | POA: Insufficient documentation

## 2016-08-04 NOTE — Progress Notes (Signed)
Daily Session Note  Patient Details  Name: [name] MRN: [id number] Date of Birth: 10-13-1940 Referring Provider:   April Manson Pulmonary Rehab Walk Test from 07/21/2016 in [place]  Referring Provider  Dr. Lake Bells      Encounter Date: 08/04/2016  Check In:     Session Check In - 08/04/16 1030      Check-In   Location [facility]   Staff Present Rosebud Poles, RN, BSN;Talma Aguillard, MS, ACSM RCEP, Exercise Physiologist;Lisa Ysidro Evert, RN;Portia Rollene Rotunda, RN, BSN   Supervising physician immediately available to respond to emergencies [facility] immediately available   Physician(s) Dr. Sloan Leiter   Medication changes reported     No   Fall or balance concerns reported    No   Tobacco Cessation No Change   Warm-up and Cool-down Performed as group-led instruction   Resistance Training Performed Yes   VAD Patient? No     Pain Assessment   Currently in Pain? No/denies   Multiple Pain Sites No      Capillary Blood Glucose: No results found for this or any previous visit (from the past 24 hour(s)).      Exercise Prescription Changes - 08/04/16 1200      Response to Exercise   Blood Pressure (Admit) 128/60   Blood Pressure (Exercise) 130/60   Blood Pressure (Exit) 112/60   Heart Rate (Admit) 90 bpm   Heart Rate (Exercise) 106 bpm   Heart Rate (Exit) 83 bpm   Oxygen Saturation (Admit) 98 %   Oxygen Saturation (Exercise) 93 %   Oxygen Saturation (Exit) 99 %   Rating of Perceived Exertion (Exercise) 13   Perceived Dyspnea (Exercise) 2   Duration Progress to 45 minutes of aerobic exercise without signs/symptoms of physical distress   Intensity THRR unchanged     Progression   Progression Continue to progress workloads to maintain intensity without signs/symptoms of physical distress.     Resistance Training   Training Prescription Yes   Weight blue bands   Reps 10-15   Time 10 Minutes     Interval  Training   Interval Training No     Oxygen   Oxygen Continuous   Liters 3     Recumbant Bike   Level 2   Minutes 17     Track   Laps 11   Minutes 17      History  Smoking Status  . Never Smoker  Smokeless Tobacco  . Never Used    Goals Met:  Exercise tolerated well No report of cardiac concerns or symptoms Strength training completed today  Goals Unmet:  Not Applicable  Comments: Service time is from 10:30a to 12:20p    Dr. Rush Farmer is Medical Director for Pulmonary Rehab at [facility].

## 2016-08-09 ENCOUNTER — Encounter (HOSPITAL_COMMUNITY): Payer: Medicare Other

## 2016-08-11 ENCOUNTER — Encounter (HOSPITAL_COMMUNITY)
Admission: RE | Admit: 2016-08-11 | Discharge: 2016-08-11 | Disposition: A | Discharge status: Home / Self Care | Payer: Medicare Other | Source: Ambulatory Visit | Attending: Pulmonary Disease | Admitting: Pulmonary Disease

## 2016-08-11 VITALS — Wt 229.3 lb

## 2016-08-11 DIAGNOSIS — J679 Hypersensitivity pneumonitis due to unspecified organic dust: Secondary | ICD-10-CM | POA: Diagnosis not present

## 2016-08-11 DIAGNOSIS — J8410 Pulmonary fibrosis, unspecified: Secondary | ICD-10-CM | POA: Diagnosis not present

## 2016-08-11 NOTE — Progress Notes (Signed)
Daily Session Note  Patient Details  Name: [name] MRN: [id number] Date of Birth: January 20, 1941 Referring Provider:   April Manson Pulmonary Rehab Walk Test from 07/21/2016 in [place]  Referring Provider  Dr. Lake Bells      Encounter Date: 08/11/2016  Check In:     Session Check In - 08/11/16 1030      Check-In   Location [facility]   Staff Present Rosebud Poles, RN, BSN;Adie Vilar, MS, ACSM RCEP, Exercise Physiologist;Portia Rollene Rotunda, RN, BSN   Supervising physician immediately available to respond to emergencies [facility] immediately available   Physician(s) Dr. Tana Coast   Medication changes reported     No   Fall or balance concerns reported    No   Tobacco Cessation No Change   Warm-up and Cool-down Performed as group-led instruction   Resistance Training Performed Yes   VAD Patient? No     Pain Assessment   Currently in Pain? No/denies   Multiple Pain Sites No      Capillary Blood Glucose: No results found for this or any previous visit (from the past 24 hour(s)).      Exercise Prescription Changes - 08/11/16 1300      Response to Exercise   Blood Pressure (Admit) 140/66   Blood Pressure (Exercise) 160/84   Blood Pressure (Exit) 124/80   Heart Rate (Admit) 85 bpm   Heart Rate (Exercise) 105 bpm   Heart Rate (Exit) 91 bpm   Oxygen Saturation (Admit) 98 %   Oxygen Saturation (Exercise) 93 %   Oxygen Saturation (Exit) 91 %   Rating of Perceived Exertion (Exercise) 13   Perceived Dyspnea (Exercise) 1   Duration Progress to 45 minutes of aerobic exercise without signs/symptoms of physical distress   Intensity THRR unchanged     Progression   Progression Continue to progress workloads to maintain intensity without signs/symptoms of physical distress.     Resistance Training   Training Prescription Yes   Weight Blue bands   Reps 10-15   Time 10 Minutes     Interval Training   Interval  Training No     Oxygen   Oxygen Continuous   Liters 3     Recumbant Bike   Level 2   Minutes 17     Track   Laps 13   Minutes 17      History  Smoking Status  . Never Smoker  Smokeless Tobacco  . Never Used    Goals Met:  Exercise tolerated well No report of cardiac concerns or symptoms Strength training completed today  Goals Unmet:  Not Applicable  Comments: Service time is from 10:30a to 12:40p    Dr. Rush Farmer is Medical Director for Pulmonary Rehab at [facility]

## 2016-08-16 ENCOUNTER — Encounter (HOSPITAL_COMMUNITY)
Admission: RE | Admit: 2016-08-16 | Discharge: 2016-08-16 | Disposition: A | Discharge status: Home / Self Care | Payer: Medicare Other | Source: Ambulatory Visit | Attending: Pulmonary Disease | Admitting: Pulmonary Disease

## 2016-08-16 DIAGNOSIS — M1990 Unspecified osteoarthritis, unspecified site: Secondary | ICD-10-CM | POA: Diagnosis not present

## 2016-08-16 DIAGNOSIS — M2550 Pain in unspecified joint: Secondary | ICD-10-CM | POA: Diagnosis not present

## 2016-08-16 DIAGNOSIS — M150 Primary generalized (osteo)arthritis: Secondary | ICD-10-CM | POA: Diagnosis not present

## 2016-08-16 DIAGNOSIS — Z79899 Other long term (current) drug therapy: Secondary | ICD-10-CM | POA: Diagnosis not present

## 2016-08-16 DIAGNOSIS — E669 Obesity, unspecified: Secondary | ICD-10-CM | POA: Diagnosis not present

## 2016-08-16 DIAGNOSIS — Z6834 Body mass index (BMI) 34.0-34.9, adult: Secondary | ICD-10-CM | POA: Diagnosis not present

## 2016-08-16 DIAGNOSIS — J849 Interstitial pulmonary disease, unspecified: Secondary | ICD-10-CM | POA: Diagnosis not present

## 2016-08-16 DIAGNOSIS — J8410 Pulmonary fibrosis, unspecified: Secondary | ICD-10-CM

## 2016-08-16 NOTE — Progress Notes (Signed)
Pulmonary Individual Treatment Plan  Patient Details  Name: [name] MRN: [id number] Date of Birth: 1941-04-13 Referring Provider:   April Manson Pulmonary Rehab Walk Test from 07/21/2016 in [place]  Referring Provider  Dr. Lake Bells      Initial Encounter Date:  Flowsheet Row Pulmonary Rehab Walk Test from 07/21/2016 in [place]  Date  07/14/16  Referring Provider  Dr. Lake Bells      Visit Diagnosis: Diffuse idiopathic pulmonary fibrosis ([place])  Patient's Home Medications on Admission:   Current Outpatient Prescriptions:  .  albuterol (PROVENTIL HFA;VENTOLIN HFA) 108 (90 BASE) MCG/ACT inhaler, Inhale 2 puffs into the lungs every 6 (six) hours as needed for wheezing or shortness of breath. , Disp: , Rfl:  .  albuterol (PROVENTIL) (2.5 MG/3ML) 0.083% nebulizer solution, Take 3 mLs (2.5 mg total) by nebulization every 6 (six) hours as needed for wheezing or shortness of breath., Disp: 75 mL, Rfl: 0 .  aspirin 325 MG tablet, Take 650 mg by mouth every 6 (six) hours as needed for mild pain or moderate pain. , Disp: , Rfl:  .  dextromethorphan-guaiFENesin (MUCINEX DM) 30-600 MG 12hr tablet, Take 1 tablet by mouth 2 (two) times daily. (Patient not taking: Reported on 07/18/2016), Disp: 14 tablet, Rfl: 0 .  furosemide (LASIX) 20 MG tablet, Take 20 mg by mouth as needed for fluid. , Disp: , Rfl:  .  hydrOXYzine (ATARAX/VISTARIL) 10 MG tablet, Take 1 tablet (10 mg total) by mouth 3 (three) times daily as needed for anxiety. (Patient not taking: Reported on 07/06/2016), Disp: 30 tablet, Rfl: 0 .  ibuprofen (ADVIL,MOTRIN) 200 MG tablet, Take 400 mg by mouth every 6 (six) hours as needed for mild pain or moderate pain. , Disp: , Rfl:  .  loratadine (CLARITIN) 5 MG chewable tablet, Chew 5 mg by mouth daily as needed for allergies., Disp: , Rfl:  .  predniSONE (DELTASONE) 10 MG tablet, Take 4 tabs for 3 days, then 3 tabs for 3  days, then 2 tabs for 3 days, then 1 tab for 3 days, then 1/2 tab for 4 days. (Patient not taking: Reported on 07/18/2016), Disp: 32 tablet, Rfl: 0  Past Medical History: Past Medical History:  Diagnosis Date  . Allergic rhinitis    takes Claritin nightly  . Anxiety    but not on any meds  . Arthritis   . Asthma   . Carpal tunnel syndrome   . GERD (gastroesophageal reflux disease)   . History of bronchitis 3-69yr ago  . History of colon polyps    benign  . Joint pain   . Peripheral edema    takes Lasix daily  . Shortness of breath dyspnea    with exertion;Albuterol inhaler as needed  . Thyroid nodule     Tobacco Use: History  Smoking Status  . Never Smoker  Smokeless Tobacco  . Never Used    Labs: Recent Review Flowsheet Data    Labs for ITP Cardiac and Pulmonary Rehab Latest Ref Rng & Units 11/18/2014 11/21/2014   PHART 7.350 - 7.450 7.426 7.356   PCO2ART 35.0 - 45.0 mmHg 40.7 49.1(H)   HCO3 20.0 - 24.0 mEq/L 26.3(H) 26.8(H)   TCO2 0 - 100 mmol/L 27.5 28.4   O2SAT % 96.8 96.2      Capillary Blood Glucose: No results found for: GLUCAP   ADL UCSD:   Pulmonary Function Assessment:     Pulmonary Function Assessment -  07/18/16 1337      Breath   Bilateral Breath Sounds Wheezes;Clear   Shortness of Breath Limiting activity;Yes      Exercise Target Goals:    Exercise Program Goal: Individual exercise prescription set with THRR, safety & activity barriers. Participant demonstrates ability to understand and report RPE using BORG scale, to self-measure pulse accurately, and to acknowledge the importance of the exercise prescription.  Exercise Prescription Goal: Starting with aerobic activity 30 plus minutes a day, 3 days per week for initial exercise prescription. Provide home exercise prescription and guidelines that participant acknowledges understanding prior to discharge.  Activity Barriers & Risk Stratification:     Activity Barriers & Cardiac Risk  Stratification - 07/18/16 1335      Activity Barriers & Cardiac Risk Stratification   Activity Barriers Arthritis;Left Knee Replacement;Shortness of Breath;Deconditioning;Muscular Weakness      6 Minute Walk:     6 Minute Walk    Row Name 07/21/16 1622         6 Minute Walk   Phase Initial     Distance 1320 feet     Walk Time 6 minutes     # of Rest Breaks 0     MPH 2.5     METS 2.91     RPE 13     Perceived Dyspnea  3     Symptoms No     Resting HR 86 bpm     Resting BP 130/70     Max Ex. HR 126 bpm     Max Ex. BP 182/66     2 Minute Post BP 138/80       Interval HR   Baseline HR 86     1 Minute HR 95     2 Minute HR 118     3 Minute HR 120     4 Minute HR 124     5 Minute HR 122     6 Minute HR 126     2 Minute Post HR 95     Interval Heart Rate? Yes       Interval Oxygen   Interval Oxygen? Yes     Baseline Oxygen Saturation % 95 %     Baseline Liters of Oxygen 3 L     1 Minute Oxygen Saturation % 91 %     1 Minute Liters of Oxygen 3 L     2 Minute Oxygen Saturation % 90 %     2 Minute Liters of Oxygen 3 L     3 Minute Oxygen Saturation % 90 %     3 Minute Liters of Oxygen 3 L     4 Minute Oxygen Saturation % 90 %     4 Minute Liters of Oxygen 3 L     5 Minute Oxygen Saturation % 88 %     5 Minute Liters of Oxygen 3 L     6 Minute Oxygen Saturation % 88 %     6 Minute Liters of Oxygen 3 L     2 Minute Post Oxygen Saturation % 98 %     2 Minute Post Liters of Oxygen 3 L        Oxygen Initial Assessment:     Oxygen Initial Assessment - 08/15/16 0957      Home Oxygen   Home Oxygen Device E-Tanks;Home Concentrator   Sleep Oxygen Prescription Continuous   Liters per minute 2   Home Exercise Oxygen Prescription Continuous  Home at Rest Exercise Oxygen Prescription Continuous   Compliance with Home Oxygen Use Yes     Program Oxygen Prescription   Program Oxygen Prescription Continuous     Intervention   Short Term Goals To learn and exhibit  compliance with exercise, home and travel O2 prescription;To learn and understand importance of monitoring SPO2 with pulse oximeter and demonstrate accurate use of the pulse oximeter.;To Learn and understand importance of maintaining oxygen saturations>88%;To learn and demonstrate proper purse lipped breathing techniques or other breathing techniques.;To learn and demonstrate proper use of respiratory medications   Long  Term Goals Exhibits compliance with exercise, home and travel O2 prescription      Oxygen Re-Evaluation:     Oxygen Re-Evaluation    Row Name 08/15/16 0957             Program Oxygen Prescription   Liters per minute 3         Home Oxygen   Liters per minute 3       Liters per minute 3          Oxygen Discharge (Final Oxygen Re-Evaluation):     Oxygen Re-Evaluation - 08/15/16 0957      Program Oxygen Prescription   Liters per minute 3     Home Oxygen   Liters per minute 3   Liters per minute 3      Initial Exercise Prescription:     Initial Exercise Prescription - 07/21/16 1600      Date of Initial Exercise RX and Referring Provider   Date 07/14/16   Referring Provider Dr. Lake Bells     Oxygen   Oxygen Continuous   Liters 3     NuStep   Level 2   Minutes 17   METs 1.5     Arm Ergometer   Level 1   Minutes 17     Track   Laps 8   Minutes 17     Prescription Details   Frequency (times per week) 2   Duration Progress to 45 minutes of aerobic exercise without signs/symptoms of physical distress     Intensity   THRR 40-80% of Max Heartrate 58-115   Ratings of Perceived Exertion 11-13   Perceived Dyspnea 0-4     Progression   Progression Continue progressive overload as per policy without signs/symptoms or physical distress.     Resistance Training   Training Prescription Yes   Weight blue bands   Reps 10-12      Perform Capillary Blood Glucose checks as needed.  Exercise Prescription Changes:     Exercise Prescription  Changes    Row Name 07/28/16 1240 08/02/16 1200 08/04/16 1200 08/11/16 1300       Response to Exercise   Blood Pressure (Admit) 148/69 124/60 128/60 140/66    Blood Pressure (Exercise) 150/60 138/52 130/60 160/84    Blood Pressure (Exit) 140/62 126/60 112/60 124/80    Heart Rate (Admit) 95 bpm 84 bpm 90 bpm 85 bpm    Heart Rate (Exercise) 113 bpm 107 bpm 106 bpm 105 bpm    Heart Rate (Exit) 84 bpm 80 bpm 83 bpm 91 bpm    Oxygen Saturation (Admit) 98 % 100 % 98 % 98 %    Oxygen Saturation (Exercise) 95 % 93 % 93 % 93 %    Oxygen Saturation (Exit) 97 % 98 % 99 % 91 %    Rating of Perceived Exertion (Exercise) _0 Perceived Dyspnea (  Exercise) _0 Duration Progress to 45 minutes of aerobic exercise without signs/symptoms of physical distress Progress to 45 minutes of aerobic exercise without signs/symptoms of physical distress Progress to 45 minutes of aerobic exercise without signs/symptoms of physical distress Progress to 45 minutes of aerobic exercise without signs/symptoms of physical distress    Intensity Other (comment)  40-80 HRR THRR unchanged THRR unchanged THRR unchanged      Progression   Progression Continue to progress workloads to maintain intensity without signs/symptoms of physical distress. Continue to progress workloads to maintain intensity without signs/symptoms of physical distress. Continue to progress workloads to maintain intensity without signs/symptoms of physical distress. Continue to progress workloads to maintain intensity without signs/symptoms of physical distress.      Resistance Training   Training Prescription Yes Yes Yes Yes    Weight blue bands blue bands blue bands Blue bands    Reps 10-12 10-15 10-15 10-15    Time  - 10 Minutes 10 Minutes 10 Minutes      Interval Training   Interval Training  -  - No No      Oxygen   Oxygen Continuous Continuous Continuous Continuous    Liters _1 Recumbant Bike   Level  - _2 Minutes  - _3 NuStep   Level  - 2  -  -    Minutes  - 17  -  -    METs  - 1.7  -  -      Arm Ergometer   Level 1  -  -  -    Minutes 17  -  -  -      Track   Laps -  pt did not count steps _4 Minutes _5 Exercise Comments:   Exercise Goals and Review:   Exercise Goals Re-Evaluation :     Exercise Goals Re-Evaluation    Row Name 08/15/16 1606             Exercise Goal Re-Evaluation   Exercise Goals Review Increase Strenth and Stamina;Increase Physical Activity       Comments Patient has only attended four exercise sessions. Has a hard time pushing herself. 1.7 MET average on Stepper. Will cont. to monitor and progress.        Expected Outcomes Through the exercise here at rehab the patient with increase physical capacity, strength, and stamina.          Discharge Exercise Prescription (Final Exercise Prescription Changes):     Exercise Prescription Changes - 08/11/16 1300      Response to Exercise   Blood Pressure (Admit) 140/66   Blood Pressure (Exercise) 160/84   Blood Pressure (Exit) 124/80   Heart Rate (Admit) 85 bpm   Heart Rate (Exercise) 105 bpm   Heart Rate (Exit) 91 bpm   Oxygen Saturation (Admit) 98 %   Oxygen Saturation (Exercise) 93 %   Oxygen Saturation (Exit) 91 %   Rating of Perceived Exertion (Exercise) 13   Perceived Dyspnea (Exercise) 1   Duration Progress to 45 minutes of aerobic exercise without signs/symptoms of physical distress   Intensity THRR unchanged     Progression   Progression Continue to progress workloads to maintain intensity without signs/symptoms of physical distress.     Resistance  Training   Training Prescription Yes   Weight Blue bands   Reps 10-15   Time 10 Minutes     Interval Training   Interval Training No     Oxygen   Oxygen Continuous   Liters 3     Recumbant Bike   Level 2   Minutes 17     Track   Laps 13   Minutes 17      Nutrition:  Target Goals:  Understanding of nutrition guidelines, daily intake of sodium <1516m, cholesterol <2050m calories 30% from fat and 7% or less from saturated fats, daily to have 5 or more servings of fruits and vegetables.  Biometrics:     Pre Biometrics - 07/18/16 1339      Pre Biometrics   Grip Strength 33 kg       Nutrition Therapy Plan and Nutrition Goals:   Nutrition Discharge: Rate Your Plate Scores:   Nutrition Goals Re-Evaluation:   Nutrition Goals Discharge (Final Nutrition Goals Re-Evaluation):   Psychosocial: Target Goals: Acknowledge presence or absence of significant depression and/or stress, maximize coping skills, provide positive support system. Participant is able to verbalize types and ability to use techniques and skills needed for reducing stress and depression.  Initial Review & Psychosocial Screening:     Initial Psych Review & Screening - 07/18/16 1341      Initial Review   Current issues with --  none identified     Family Dynamics   Good Support System? Yes     Barriers   Psychosocial barriers to participate in program There are no identifiable barriers or psychosocial needs.     Screening Interventions   Interventions Encouraged to exercise      Quality of Life Scores:   PHQ-9: Recent Review Flowsheet Data    [facility] 07/18/2016 02/16/2015   Decreased Interest 0 0   Down, Depressed, Hopeless 0 0   PHQ - 2 Score 0 0     Interpretation of Total Score  Total Score Depression Severity:  1-4 = Minimal depression, 5-9 = Mild depression, 10-14 = Moderate depression, 15-19 = Moderately severe depression, 20-27 = Severe depression   Psychosocial Evaluation and Intervention:     Psychosocial Evaluation - 07/18/16 1342      Psychosocial Evaluation & Interventions   Interventions Encouraged to exercise with the program and follow exercise prescription   Continue Psychosocial Services  No      Psychosocial Re-Evaluation:      Psychosocial Re-Evaluation    [place] 08/15/16 1019 08/15/16 1213           Psychosocial Re-Evaluation   Current issues with None Identified  -      Expected Outcomes  - patient will remain free from psycosocial barriers       Interventions Encouraged to attend Pulmonary Rehabilitation for the exercise  -      Continue Psychosocial Services  No Follow up required  -         Psychosocial Discharge (Final Psychosocial Re-Evaluation):     Psychosocial Re-Evaluation - 08/15/16 1213      Psychosocial Re-Evaluation   Expected Outcomes patient will remain free from psycosocial barriers       Education: Education Goals: Education classes will be provided on a weekly basis, covering required topics. Participant will state understanding/return demonstration of topics presented.  Learning Barriers/Preferences:     Learning Barriers/Preferences - 07/18/16 1337      Learning Barriers/Preferences   Learning Barriers  None   Learning Preferences Audio;Group Instruction;Individual Instruction;Pictoral;Skilled Demonstration;Verbal Instruction      Education Topics: Risk Factor Reduction:  -Group instruction that is supported by a PowerPoint presentation. Instructor discusses the definition of a risk factor, different risk factors for pulmonary disease, and how the heart and lungs work together.     Nutrition for Pulmonary Patient:  -Group instruction provided by PowerPoint slides, verbal discussion, and written materials to support subject matter. The instructor gives an explanation and review of healthy diet recommendations, which includes a discussion on weight management, recommendations for fruit and vegetable consumption, as well as protein, fluid, caffeine, fiber, sodium, sugar, and alcohol. Tips for eating when patients are short of breath are discussed.   Pursed Lip Breathing:  -Group instruction that is supported by demonstration and informational handouts. Instructor  discusses the benefits of pursed lip and diaphragmatic breathing and detailed demonstration on how to preform both.     Oxygen Safety:  -Group instruction provided by PowerPoint, verbal discussion, and written material to support subject matter. There is an overview of "What is Oxygen" and "Why do we need it".  Instructor also reviews how to create a safe environment for oxygen use, the importance of using oxygen as prescribed, and the risks of noncompliance. There is a brief discussion on traveling with oxygen and resources the patient may utilize.   Oxygen Equipment:  -Group instruction provided by [facility] Staff utilizing handouts, written materials, and equipment demonstrations.   Signs and Symptoms:  -Group instruction provided by written material and verbal discussion to support subject matter. Warning signs and symptoms of infection, stroke, and heart attack are reviewed and when to call the physician/911 reinforced. Tips for preventing the spread of infection discussed.   Advanced Directives:  -Group instruction provided by verbal instruction and written material to support subject matter. Instructor reviews Advanced Directive laws and proper instruction for filling out document.   Pulmonary Video:  -Group video education that reviews the importance of medication and oxygen compliance, exercise, good nutrition, pulmonary hygiene, and pursed lip and diaphragmatic breathing for the pulmonary patient. Flowsheet Row PULMONARY REHAB OTHER RESPIRATORY from 07/28/2016 in [place]  Date  07/28/16  Instruction Review Code  2- meets goals/outcomes      Exercise for the Pulmonary Patient:  -Group instruction that is supported by a PowerPoint presentation. Instructor discusses benefits of exercise, core components of exercise, frequency, duration, and intensity of an exercise routine, importance of utilizing pulse oximetry during exercise, safety while  exercising, and options of places to exercise outside of rehab.     Pulmonary Medications:  -Verbally interactive group education provided by instructor with focus on inhaled medications and proper administration.   Anatomy and Physiology of the Respiratory System and Intimacy:  -Group instruction provided by PowerPoint, verbal discussion, and written material to support subject matter. Instructor reviews respiratory cycle and anatomical components of the respiratory system and their functions. Instructor also reviews differences in obstructive and restrictive respiratory diseases with examples of each. Intimacy, Sex, and Sexuality differences are reviewed with a discussion on how relationships can change when diagnosed with pulmonary disease. Common sexual concerns are reviewed. Flowsheet Row PULMONARY REHAB OTHER RESPIRATORY from 08/11/2016 in [place]  Date  08/11/16  Educator  RN  Instruction Review Code  2- meets goals/outcomes      Knowledge Questionnaire Score:   Core Components/Risk Factors/Patient Goals at Admission:     Personal Goals and Risk Factors  at Admission - 07/18/16 1339      Core Components/Risk Factors/Patient Goals on Admission   Increase Strength and Stamina Yes   Improve shortness of breath with ADL's Yes   Develop more efficient breathing techniques such as purse lipped breathing and diaphragmatic breathing; and practicing self-pacing with activity Yes   Increase knowledge of respiratory medications and ability to use respiratory devices properly  Yes      Core Components/Risk Factors/Patient Goals Review:      Goals and Risk Factor Review    Row Name 07/18/16 1340 08/15/16 1009           Core Components/Risk Factors/Patient Goals Review   Personal Goals Review Increase Strength and Stamina;Improve shortness of breath with ADL's;Develop more efficient breathing techniques such as purse lipped breathing and  diaphragmatic breathing and practicing self-pacing with activity.;Increase knowledge of respiratory medications and ability to use respiratory devices properly. Increase knowledge of respiratory medications and ability to use respiratory devices properly.      Review  - see comments section in ITP      Expected Outcomes  - see expected outcomes on Admission ITP         Core Components/Risk Factors/Patient Goals at Discharge (Final Review):      Goals and Risk Factor Review - 08/15/16 1009      Core Components/Risk Factors/Patient Goals Review   Personal Goals Review Increase knowledge of respiratory medications and ability to use respiratory devices properly.   Review see comments section in ITP   Expected Outcomes see expected outcomes on Admission ITP      ITP Comments:   Comments: ITP REVIEW Pt is making expected progress toward pulmonary rehab goals after completing 4 sessions. She is tolerating workload increases. She has to be reminded to utilize pursed lip breathing technique for shortness of breath. Expect to see a significant improvement in her ability to exercise over the next 30 days. Recommend continued exercise, life style modification, education, and utilization of breathing techniques to increase stamina and strength and decrease shortness of breath with exertion.

## 2016-08-18 ENCOUNTER — Encounter (HOSPITAL_COMMUNITY)
Admission: RE | Admit: 2016-08-18 | Discharge: 2016-08-18 | Disposition: A | Discharge status: Home / Self Care | Payer: Medicare Other | Source: Ambulatory Visit | Attending: Pulmonary Disease | Admitting: Pulmonary Disease

## 2016-08-18 VITALS — Wt 226.4 lb

## 2016-08-18 DIAGNOSIS — J8410 Pulmonary fibrosis, unspecified: Secondary | ICD-10-CM | POA: Diagnosis not present

## 2016-08-18 DIAGNOSIS — J679 Hypersensitivity pneumonitis due to unspecified organic dust: Secondary | ICD-10-CM | POA: Diagnosis not present

## 2016-08-18 NOTE — Progress Notes (Signed)
Daily Session Note  Patient Details  Name: [name] MRN: [id number] Date of Birth: [birth year] Referring Provider:     Pulmonary Rehab Walk Test from 07/21/2016 in [place]  Referring Provider  Dr. Lake Bells      Encounter Date: 08/18/2016  Check In:     Session Check In - 08/18/16 1027      Check-In   Location [facility]   Staff Present Trish Fountain, RN, Maxcine Ham, RN, BSN;Lisa Hughes, RN;Won Kreuzer, MS, ACSM RCEP, Exercise Physiologist   Supervising physician immediately available to respond to emergencies [facility] immediately available   Physician(s) Dr. Sloan Leiter   Medication changes reported     No   Fall or balance concerns reported    No   Tobacco Cessation No Change   Warm-up and Cool-down Performed as group-led instruction   Resistance Training Performed Yes   VAD Patient? No     Pain Assessment   Currently in Pain? No/denies   Multiple Pain Sites No      Capillary Blood Glucose: No results found for this or any previous visit (from the past 24 hour(s)).      Exercise Prescription Changes - 08/18/16 1300      Response to Exercise   Blood Pressure (Admit) 120/60   Blood Pressure (Exercise) 164/66   Blood Pressure (Exit) 124/66   Heart Rate (Admit) 99 bpm   Heart Rate (Exercise) 126 bpm   Heart Rate (Exit) 98 bpm   Oxygen Saturation (Admit) 98 %   Oxygen Saturation (Exercise) 88 %   Oxygen Saturation (Exit) 98 %   Rating of Perceived Exertion (Exercise) 13   Perceived Dyspnea (Exercise) 2   Duration Progress to 45 minutes of aerobic exercise without signs/symptoms of physical distress   Intensity THRR unchanged     Progression   Progression Continue to progress workloads to maintain intensity without signs/symptoms of physical distress.     Resistance Training   Training Prescription Yes   Weight Blue bands   Reps 10-15   Time 10 Minutes     Interval Training   Interval Training No     Oxygen   Oxygen Continuous   Liters 3     Recumbant Bike   Level 2   Minutes 17     Track   Laps 15   Minutes 17      History  Smoking Status  . Never Smoker  Smokeless Tobacco  . Never Used    Goals Met:  Exercise tolerated well No report of cardiac concerns or symptoms Strength training completed today  Goals Unmet:  Not Applicable  Comments: Service time is from 10:30A to 12:45P    Dr. Rush Farmer is Medical Director for Pulmonary Rehab at [facility].

## 2016-08-23 ENCOUNTER — Encounter (HOSPITAL_COMMUNITY)
Admission: RE | Admit: 2016-08-23 | Discharge: 2016-08-23 | Disposition: A | Discharge status: Home / Self Care | Payer: Medicare Other | Source: Ambulatory Visit | Attending: Pulmonary Disease | Admitting: Pulmonary Disease

## 2016-08-23 VITALS — Wt 226.4 lb

## 2016-08-23 DIAGNOSIS — J8410 Pulmonary fibrosis, unspecified: Secondary | ICD-10-CM

## 2016-08-23 DIAGNOSIS — J679 Hypersensitivity pneumonitis due to unspecified organic dust: Secondary | ICD-10-CM | POA: Diagnosis not present

## 2016-08-23 NOTE — Progress Notes (Signed)
Daily Session Note  Patient Details  Name: [name] MRN: [id number] Date of Birth: [birth year] Referring Provider:     Pulmonary Rehab Walk Test from 07/21/2016 in [place]  Referring Provider  Dr. Lake Bells      Encounter Date: 08/23/2016  Check In:     Session Check In - 08/23/16 1207      Check-In   Location [facility]   Staff Present Su Hilt, MS, ACSM RCEP, Exercise Physiologist;Joan Leonia Reeves, RN, BSN;Lisa Ysidro Evert, RN;Leamon Palau Rollene Rotunda, RN, BSN   Supervising physician immediately available to respond to emergencies [facility] immediately available   Physician(s) Dr. Candiss Norse   Medication changes reported     No   Fall or balance concerns reported    No   Tobacco Cessation No Change   Warm-up and Cool-down Performed as group-led instruction   Resistance Training Performed Yes   VAD Patient? No     Pain Assessment   Currently in Pain? No/denies   Multiple Pain Sites No      Capillary Blood Glucose: No results found for this or any previous visit (from the past 24 hour(s)).      Exercise Prescription Changes - 08/23/16 1229      Response to Exercise   Blood Pressure (Admit) 128/68   Blood Pressure (Exercise) 154/80   Blood Pressure (Exit) 104/60   Heart Rate (Admit) 92 bpm   Heart Rate (Exercise) 108 bpm   Heart Rate (Exit) 77 bpm   Oxygen Saturation (Admit) 98 %   Oxygen Saturation (Exercise) 92 %   Oxygen Saturation (Exit) 98 %   Rating of Perceived Exertion (Exercise) 12   Perceived Dyspnea (Exercise) 2   Duration Progress to 45 minutes of aerobic exercise without signs/symptoms of physical distress   Intensity THRR unchanged     Progression   Progression Continue to progress workloads to maintain intensity without signs/symptoms of physical distress.     Resistance Training   Training Prescription Yes   Weight Blue bands   Reps 10-15   Time 10 Minutes     Interval Training   Interval Training No     Oxygen   Oxygen Continuous   Liters 3     Recumbant Bike   Level 2   Minutes 17     NuStep   Level 3   Minutes 17   METs 1.7     Track   Laps 11   Minutes 17      History  Smoking Status  . Never Smoker  Smokeless Tobacco  . Never Used    Goals Met:  Exercise tolerated well Queuing for purse lip breathing No report of cardiac concerns or symptoms Strength training completed today  Goals Unmet:  Not Applicable  Comments: Service time is from 1030 to 1200   Dr. Rush Farmer is Medical Director for Pulmonary Rehab at [facility].

## 2016-08-25 ENCOUNTER — Encounter (HOSPITAL_COMMUNITY)
Admission: RE | Admit: 2016-08-25 | Discharge: 2016-08-25 | Disposition: A | Discharge status: Home / Self Care | Payer: Medicare Other | Source: Ambulatory Visit | Attending: Pulmonary Disease | Admitting: Pulmonary Disease

## 2016-08-25 VITALS — Wt 227.1 lb

## 2016-08-25 DIAGNOSIS — J8410 Pulmonary fibrosis, unspecified: Secondary | ICD-10-CM | POA: Diagnosis not present

## 2016-08-25 DIAGNOSIS — J679 Hypersensitivity pneumonitis due to unspecified organic dust: Secondary | ICD-10-CM | POA: Diagnosis not present

## 2016-08-25 NOTE — Progress Notes (Signed)
Daily Session Note  Patient Details  Name: [name] MRN: [id number] Date of Birth: [birth year] Referring Provider:     Pulmonary Rehab Walk Test from 07/21/2016 in [place]  Referring Provider  Dr. Lake Bells      Encounter Date: 08/25/2016  Check In:     Session Check In - 08/25/16 1030      Check-In   [facility]   Staff Present Su Hilt, MS, ACSM RCEP, Exercise Physiologist;Elim Economou Ysidro Evert, RN;Portia Rollene Rotunda, RN, BSN   Supervising physician immediately available to respond to emergencies [facility] immediately available   Physician(s) Dr. Allyson Sabal   Fall or balance concerns reported    No   Tobacco Cessation No Change   Warm-up and Cool-down Performed as group-led instruction   Resistance Training Performed Yes   VAD Patient? No     Pain Assessment   Currently in Pain? No/denies   Multiple Pain Sites No      Capillary Blood Glucose: No results found for this or any previous visit (from the past 24 hour(s)).      Exercise Prescription Changes - 08/25/16 1200      Response to Exercise   Blood Pressure (Admit) 134/56   Blood Pressure (Exercise) 150/60   Blood Pressure (Exit) 116/78   Heart Rate (Admit) 80 bpm   Heart Rate (Exercise) 107 bpm   Heart Rate (Exit) 77 bpm   Oxygen Saturation (Admit) 99 %   Oxygen Saturation (Exercise) 94 %   Oxygen Saturation (Exit) 99 %   Rating of Perceived Exertion (Exercise) 13   Perceived Dyspnea (Exercise) 2   Duration Progress to 45 minutes of aerobic exercise without signs/symptoms of physical distress   Intensity THRR unchanged     Progression   Progression Continue to progress workloads to maintain intensity without signs/symptoms of physical distress.     Resistance Training   Training Prescription Yes   Weight blue bands   Reps 10-15   Time 10 Minutes     Interval Training   Interval Training No     Oxygen   Oxygen Continuous   Liters 3      Recumbant Bike   Level 2   Minutes 17     Track   Laps 14   Minutes 17      History  Smoking Status  . Never Smoker  Smokeless Tobacco  . Never Used    Goals Met:  Exercise tolerated well No report of cardiac concerns or symptoms Strength training completed today  Goals Unmet:  Not Applicable  Comments: Service time is from 1030 to 1220    Dr. Rush Farmer is Medical Director for Pulmonary Rehab at [facility].

## 2016-08-30 ENCOUNTER — Encounter (HOSPITAL_COMMUNITY)
Admission: RE | Admit: 2016-08-30 | Discharge: 2016-08-30 | Disposition: A | Discharge status: Home / Self Care | Payer: Medicare Other | Source: Ambulatory Visit | Attending: Pulmonary Disease | Admitting: Pulmonary Disease

## 2016-08-30 VITALS — Wt 225.3 lb

## 2016-08-30 DIAGNOSIS — J679 Hypersensitivity pneumonitis due to unspecified organic dust: Secondary | ICD-10-CM | POA: Diagnosis not present

## 2016-08-30 DIAGNOSIS — J8410 Pulmonary fibrosis, unspecified: Secondary | ICD-10-CM

## 2016-08-30 NOTE — Progress Notes (Signed)
Daily Session Note  Patient Details  Name: [name] MRN: [id number] Date of Birth: [birth year] Referring Provider:     Pulmonary Rehab Walk Test from 07/21/2016 in [place]  Referring Provider  Dr. Lake Bells      Encounter Date: 08/30/2016  Check In:     Session Check In - 08/30/16 1030      Check-In   Location [facility]   Staff Present Rosebud Poles, RN, BSN;Molly diVincenzo, MS, ACSM RCEP, Exercise Physiologist;Holman Bonsignore Rollene Rotunda, Therapist, sports, BSN;Ramon Dredge, RN, [facility]   Supervising physician immediately available to respond to emergencies [facility] immediately available   Physician(s) Dr. Tana Coast   Medication changes reported     No   Fall or balance concerns reported    No   Tobacco Cessation No Change   Warm-up and Cool-down Performed as group-led instruction   Resistance Training Performed Yes   VAD Patient? No     Pain Assessment   Currently in Pain? No/denies   Multiple Pain Sites No      Capillary Blood Glucose: No results found for this or any previous visit (from the past 24 hour(s)).      Exercise Prescription Changes - 08/30/16 1239      Response to Exercise   Blood Pressure (Admit) 152/64   Blood Pressure (Exercise) 150/80   Blood Pressure (Exit) 142/90   Heart Rate (Admit) 97 bpm   Heart Rate (Exercise) 119 bpm   Heart Rate (Exit) 87 bpm   Oxygen Saturation (Admit) 99 %   Oxygen Saturation (Exercise) 96 %   Oxygen Saturation (Exit) 99 %   Rating of Perceived Exertion (Exercise) 13   Perceived Dyspnea (Exercise) 1   Duration Progress to 45 minutes of aerobic exercise without signs/symptoms of physical distress   Intensity THRR unchanged     Progression   Progression Continue to progress workloads to maintain intensity without signs/symptoms of physical distress.     Resistance Training   Training Prescription Yes   Weight blue bands   Reps 10-15   Time 10 Minutes     Interval Training    Interval Training No     Oxygen   Oxygen Continuous   Liters 3     Recumbant Bike   Level 2   Minutes 17     NuStep   Level 3   Minutes 17   METs 1.8     Track   Laps 14   Minutes 17      History  Smoking Status  . Never Smoker  Smokeless Tobacco  . Never Used    Goals Met:  Improved SOB with ADL's Exercise tolerated well Queuing for purse lip breathing No report of cardiac concerns or symptoms Strength training completed today  Goals Unmet:  Not Applicable  Comments: Service time is from 1030 to 1205   Dr. Rush Farmer is Medical Director for Pulmonary Rehab at [facility].

## 2016-08-30 NOTE — Progress Notes (Signed)
Nutrition Note Pt has not returned her orientation homework packet. Pt's Rate Your Plate nutrition survey completed with pt. Will f/u to review pt's nutrition plan and nutrition survey at a later time. Continue client-centered nutrition education by RD as part of interdisciplinary care.  Monitor and evaluate progress toward nutrition goal with team.  Derek Mound, M.Ed, RD, LDN, CDE 08/30/2016 12:05 PM

## 2016-09-01 ENCOUNTER — Encounter (HOSPITAL_COMMUNITY)
Admission: RE | Admit: 2016-09-01 | Discharge: 2016-09-01 | Disposition: A | Discharge status: Home / Self Care | Payer: Medicare Other | Source: Ambulatory Visit | Attending: Pulmonary Disease | Admitting: Pulmonary Disease

## 2016-09-01 VITALS — Wt 225.3 lb

## 2016-09-01 DIAGNOSIS — J679 Hypersensitivity pneumonitis due to unspecified organic dust: Secondary | ICD-10-CM | POA: Diagnosis not present

## 2016-09-01 DIAGNOSIS — J8410 Pulmonary fibrosis, unspecified: Secondary | ICD-10-CM | POA: Diagnosis not present

## 2016-09-01 NOTE — Progress Notes (Signed)
[name] 76 y.o. female Nutrition Note Spoke with pt. Pt is obese. Pt would like to lose wt but is not actively trying to lose wt. Pt states "I think it's too late to try to lose wt." Pt eats 3-4 meals a day; most prepared at home. There are some ways the pt can make her eating habits healthier. Pt's Rate Your Plate results reviewed with pt. Pt avoids some salty food; uses reduced sodium canned soups.  Pt adds salt to food.  The role of sodium in lung disease reviewed with pt. Pt expressed understanding of the information reviewed via feedback method.    No results found for: HGBA1C  Nutrition Diagnosis ? Food-and nutrition-related knowledge deficit related to lack of exposure to information as related to diagnosis of pulmonary disease  Nutrition Intervention ? Pt's individual nutrition plan and goals reviewed with pt. ? Benefits of adopting healthy eating habits discussed when pt's Rate Your Plate reviewed. ? Pt to attend the Nutrition and Lung Disease class ? Continual client-centered nutrition education by RD, as part of interdisciplinary care. Goal(s) 1. Describe the benefit of including fruits, vegetables, whole grains, and low-fat dairy products in a healthy meal plan. Monitor and Evaluate progress toward nutrition goal with team.   Derek Mound, M.Ed, RD, LDN, CDE 09/01/2016 12:14 PM

## 2016-09-01 NOTE — Progress Notes (Signed)
Daily Session Note  Patient Details  Name: Andrienne G Stanislawski MRN: [id number] Date of Birth: 09/08/1940 Referring Provider:     Pulmonary Rehab Walk Test from 07/21/2016 in [facility]  Referring Provider  Dr. McQuaid      Encounter Date: 09/01/2016  Check In:     Session Check In - 09/01/16 1025      Check-In   [facility]   Staff Present Joan Behrens, RN, BSN;Lisa Hughes, RN;Molly diVincenzo, MS, ACSM RCEP, Exercise Physiologist   Supervising physician immediately available to respond to emergencies [facility] immediately available   Physician(s) Dr. Ghimire   Medication changes reported     No   Fall or balance concerns reported    No   Tobacco Cessation No Change   Warm-up and Cool-down Performed as group-led instruction   Resistance Training Performed Yes   VAD Patient? No     Pain Assessment   Currently in Pain? No/denies   Multiple Pain Sites No      Capillary Blood Glucose: No results found for this or any previous visit (from the past 24 hour(s)).      Exercise Prescription Changes - 09/01/16 1200      Response to Exercise   Blood Pressure (Admit) 140/72   Blood Pressure (Exercise) 138/76   Blood Pressure (Exit) 124/60   Heart Rate (Admit) 86 bpm   Heart Rate (Exercise) 106 bpm   Heart Rate (Exit) 81 bpm   Oxygen Saturation (Admit) 96 %   Oxygen Saturation (Exercise) 94 %   Oxygen Saturation (Exit) 96 %   Rating of Perceived Exertion (Exercise) 11   Perceived Dyspnea (Exercise) 1   Duration Progress to 45 minutes of aerobic exercise without signs/symptoms of physical distress   Intensity THRR unchanged     Progression   Progression Continue to progress workloads to maintain intensity without signs/symptoms of physical distress.     Resistance Training   Training Prescription Yes   Weight blue bands   Reps 10-15   Time 10 Minutes     Interval Training   Interval Training No     Oxygen   Oxygen Continuous   Liters 3     Recumbant Bike   Level 2   Minutes 17     NuStep   Level 3   Minutes 17   METs 1.7      History  Smoking Status  . Never Smoker  Smokeless Tobacco  . Never Used    Goals Met:  Exercise tolerated well No report of cardiac concerns or symptoms Strength training completed today  Goals Unmet:  Not Applicable  Comments: Service time is from 1030 to 1230    Dr. Wesam G. Yacoub is Medical Director for Pulmonary Rehab at [facility]. 

## 2016-09-06 ENCOUNTER — Encounter (HOSPITAL_COMMUNITY)
Admission: RE | Admit: 2016-09-06 | Discharge: 2016-09-06 | Disposition: A | Discharge status: Home / Self Care | Payer: Medicare Other | Source: Ambulatory Visit | Attending: Pulmonary Disease | Admitting: Pulmonary Disease

## 2016-09-06 VITALS — Wt 217.2 lb

## 2016-09-06 DIAGNOSIS — J679 Hypersensitivity pneumonitis due to unspecified organic dust: Secondary | ICD-10-CM | POA: Insufficient documentation

## 2016-09-06 DIAGNOSIS — J8410 Pulmonary fibrosis, unspecified: Secondary | ICD-10-CM | POA: Diagnosis not present

## 2016-09-06 NOTE — Progress Notes (Signed)
Daily Session Note  Patient Details  Name: [name] MRN: [id number] Date of Birth: 10-28-1940 Referring Provider:     Pulmonary Rehab Walk Test from 07/21/2016 in [place]  Referring Provider  Dr. Lake Bells      Encounter Date: 09/06/2016  Check In:     Session Check In - 09/06/16 1051      Check-In   Location [facility]   Staff Present Rodney Langton, RN;Portia Rollene Rotunda, RN, BSN;Ruba Outen, MS, ACSM RCEP, Exercise Physiologist;Joann Rion, RN, BSN   Supervising physician immediately available to respond to emergencies [facility] immediately available   Physician(s) Dr. Doyle Askew   Medication changes reported     No   Fall or balance concerns reported    No   Tobacco Cessation No Change   Warm-up and Cool-down Performed as group-led instruction   Resistance Training Performed Yes   VAD Patient? No     Pain Assessment   Currently in Pain? No/denies   Multiple Pain Sites No      Capillary Blood Glucose: No results found for this or any previous visit (from the past 24 hour(s)).      Exercise Prescription Changes - 09/06/16 1200      Response to Exercise   Blood Pressure (Admit) 140/58   Blood Pressure (Exercise) 142/78   Blood Pressure (Exit) 138/78   Heart Rate (Admit) 87 bpm   Heart Rate (Exercise) 115 bpm   Heart Rate (Exit) 91 bpm   Oxygen Saturation (Admit) 98 %   Oxygen Saturation (Exercise) 94 %   Oxygen Saturation (Exit) 97 %   Rating of Perceived Exertion (Exercise) 11   Perceived Dyspnea (Exercise) 3   Duration Progress to 45 minutes of aerobic exercise without signs/symptoms of physical distress   Intensity THRR unchanged     Progression   Progression Continue to progress workloads to maintain intensity without signs/symptoms of physical distress.     Resistance Training   Training Prescription Yes   Weight blue bands   Reps 10-15   Time 10 Minutes     Interval Training   Interval  Training No     Oxygen   Oxygen Continuous   Liters 3     Recumbant Bike   Level 2   Minutes 17     NuStep   Level 3   Minutes 17   METs 1.8     Track   Laps 11   Minutes 17      History  Smoking Status  . Never Smoker  Smokeless Tobacco  . Never Used    Goals Met:  Exercise tolerated well No report of cardiac concerns or symptoms Strength training completed today  Goals Unmet:  Not Applicable  Comments: Service time is from 10:30a to 12:00p    Dr. Rush Farmer is Medical Director for Pulmonary Rehab at [facility].

## 2016-09-08 ENCOUNTER — Encounter (HOSPITAL_COMMUNITY)
Admission: RE | Admit: 2016-09-08 | Discharge: 2016-09-08 | Disposition: A | Discharge status: Home / Self Care | Payer: Medicare Other | Source: Ambulatory Visit | Attending: Pulmonary Disease | Admitting: Pulmonary Disease

## 2016-09-08 VITALS — Wt 227.7 lb

## 2016-09-08 DIAGNOSIS — J679 Hypersensitivity pneumonitis due to unspecified organic dust: Secondary | ICD-10-CM | POA: Diagnosis not present

## 2016-09-08 DIAGNOSIS — J8410 Pulmonary fibrosis, unspecified: Secondary | ICD-10-CM | POA: Diagnosis not present

## 2016-09-08 NOTE — Progress Notes (Signed)
Daily Session Note  Patient Details  Name: [name] MRN: [id number] Date of Birth: [birth year] Referring Provider:     Pulmonary Rehab Walk Test from 07/21/2016 in [place]  Referring Provider  Dr. Lake Bells      Encounter Date: 09/08/2016  Check In:     Session Check In - 09/08/16 1105      Check-In   Location [facility]   Staff Present Su Hilt, MS, ACSM RCEP, Exercise Physiologist;Lisa Ysidro Evert, RN;Zain Lankford Rollene Rotunda, RN, BSN   Supervising physician immediately available to respond to emergencies [facility] immediately available   Physician(s) Dr. Allyson Sabal   Medication changes reported     No   Fall or balance concerns reported    No   Tobacco Cessation No Change   Warm-up and Cool-down Performed as group-led instruction   Resistance Training Performed Yes   VAD Patient? No     Pain Assessment   Currently in Pain? No/denies   Multiple Pain Sites No      Capillary Blood Glucose: No results found for this or any previous visit (from the past 24 hour(s)).      Exercise Prescription Changes - 09/08/16 1247      Response to Exercise   Blood Pressure (Admit) 140/70   Blood Pressure (Exercise) 160/70   Blood Pressure (Exit) 124/60   Heart Rate (Admit) 82 bpm   Heart Rate (Exercise) 137 bpm   Heart Rate (Exit) 96 bpm   Oxygen Saturation (Admit) 98 %   Oxygen Saturation (Exercise) 90 %   Oxygen Saturation (Exit) 98 %   Rating of Perceived Exertion (Exercise) 13   Perceived Dyspnea (Exercise) 1   Duration Progress to 45 minutes of aerobic exercise without signs/symptoms of physical distress   Intensity THRR unchanged     Progression   Progression Continue to progress workloads to maintain intensity without signs/symptoms of physical distress.     Resistance Training   Training Prescription Yes   Weight blue bands   Reps 10-15   Time 10 Minutes     Interval Training   Interval Training No     Oxygen   Oxygen Continuous   Liters 3     Recumbant Bike   Level 3   Minutes 17     Track   Laps 14   Minutes 17      History  Smoking Status  . Never Smoker  Smokeless Tobacco  . Never Used    Goals Met:  Using PLB without cueing & demonstrates good technique Exercise tolerated well No report of cardiac concerns or symptoms Strength training completed today  Goals Unmet:  Not Applicable  Comments: Service time is from 1030 to 1240   Dr. Rush Farmer is Medical Director for Pulmonary Rehab at [facility].

## 2016-09-12 ENCOUNTER — Ambulatory Visit: Payer: Self-pay | Admitting: Pulmonary Disease

## 2016-09-13 ENCOUNTER — Ambulatory Visit (INDEPENDENT_AMBULATORY_CARE_PROVIDER_SITE_OTHER): Payer: Medicare Other | Admitting: Pulmonary Disease

## 2016-09-13 ENCOUNTER — Encounter (HOSPITAL_COMMUNITY)
Admission: RE | Admit: 2016-09-13 | Discharge: 2016-09-13 | Disposition: A | Discharge status: Home / Self Care | Payer: Medicare Other | Source: Ambulatory Visit | Attending: Pulmonary Disease | Admitting: Pulmonary Disease

## 2016-09-13 ENCOUNTER — Encounter: Payer: Self-pay | Admitting: Pulmonary Disease

## 2016-09-13 VITALS — BP 134/62 | HR 77 | Ht 62.0 in | Wt 228.0 lb

## 2016-09-13 DIAGNOSIS — J9621 Acute and chronic respiratory failure with hypoxia: Secondary | ICD-10-CM

## 2016-09-13 DIAGNOSIS — J0111 Acute recurrent frontal sinusitis: Secondary | ICD-10-CM | POA: Diagnosis not present

## 2016-09-13 DIAGNOSIS — J849 Interstitial pulmonary disease, unspecified: Secondary | ICD-10-CM | POA: Diagnosis not present

## 2016-09-13 DIAGNOSIS — J8410 Pulmonary fibrosis, unspecified: Secondary | ICD-10-CM

## 2016-09-13 MED ORDER — ALBUTEROL SULFATE (2.5 MG/3ML) 0.083% IN NEBU: 2.5000 mg | INHALATION_SOLUTION | Freq: Four times a day (QID) | RESPIRATORY_TRACT | 5 refills | Status: DC | PRN

## 2016-09-13 NOTE — Assessment & Plan Note (Signed)
Currently needs room air at rest, 3 L with sleep, and 3 L pulse with exertion

## 2016-09-13 NOTE — Progress Notes (Signed)
Pulmonary Individual Treatment Plan  Patient Details  Name: [name] MRN: [id number] Date of Birth: [birth year] Referring Provider:     Pulmonary Rehab Walk Test from 07/21/2016 in [place]  Referring Provider  Dr. Lake Bells      Initial Encounter Date:    Pulmonary Rehab Walk Test from 07/21/2016 in [place]  Date  07/14/16  Referring Provider  Dr. 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  albuterol (PROVENTIL HFA;VENTOLIN HFA) 108 (90 BASE) MCG/ACT inhaler, Inhale 2 puffs into the lungs every 6 (six) hours as needed for wheezing or shortness of breath. , Disp: , Rfl:  .  albuterol (PROVENTIL) (2.5 MG/3ML) 0.083% nebulizer solution, Take 3 mLs (2.5 mg total) by nebulization every 6 (six) hours as needed for wheezing or shortness of breath., Disp: 75 mL, Rfl: 0 .  aspirin 325 MG tablet, Take 650 mg by mouth every 6 (six) hours as needed for mild pain or moderate pain. , Disp: , Rfl:  .  dextromethorphan-guaiFENesin (MUCINEX DM) 30-600 MG 12hr tablet, Take 1 tablet by mouth 2 (two) times daily. (Patient not taking: Reported on 07/18/2016), Disp: 14 tablet, Rfl: 0 .  furosemide (LASIX) 20 MG tablet, Take 20 mg by mouth as needed for fluid. , Disp: , Rfl:  .  hydrOXYzine (ATARAX/VISTARIL) 10 MG tablet, Take 1 tablet (10 mg total) by mouth 3 (three) times daily as needed for anxiety. (Patient not taking: Reported on 07/06/2016), Disp: 30 tablet, Rfl: 0 .  ibuprofen (ADVIL,MOTRIN) 200 MG tablet, Take 400 mg by mouth every 6 (six) hours as needed for mild pain or moderate pain. , Disp: , Rfl:  .  loratadine (CLARITIN) 5 MG chewable tablet, Chew 5 mg by mouth daily as needed for allergies., Disp: , Rfl:  .  predniSONE (DELTASONE) 10 MG tablet, Take 4 tabs for 3 days,  then 3 tabs for 3 days, then 2 tabs for 3 days, then 1 tab for 3 days, then 1/2 tab for 4 days. (Patient not taking: Reported on 07/18/2016), Disp: 32 tablet, Rfl: 0  Past Medical History: Past Medical History:  Diagnosis Date  . Allergic rhinitis    takes Claritin nightly  . Anxiety    but not on any meds  . Arthritis   . Asthma   . Carpal tunnel syndrome   . GERD (gastroesophageal reflux disease)   . History of bronchitis 3-65yr ago  . History of colon polyps    benign  . Joint pain   . Peripheral edema    takes Lasix daily  . Shortness of breath dyspnea    with exertion;Albuterol inhaler as needed  . Thyroid nodule     Tobacco Use: History  Smoking Status  . Never Smoker  Smokeless Tobacco  . Never Used    Labs: Recent Review Flowsheet Data    Labs for ITP Cardiac and Pulmonary Rehab Latest Ref Rng & Units 11/18/2014 11/21/2014   PHART 7.350 - 7.450 7.426 7.356   PCO2ART 35.0 - 45.0 mmHg 40.7 49.1(H)   HCO3 20.0 - 24.0 mEq/L 26.3(H) 26.8(H)   TCO2 0 - 100 mmol/L 27.5 28.4   O2SAT % 96.8 96.2      Capillary Blood Glucose: No results found for: GLUCAP   ADL UCSD:   Pulmonary Function Assessment:  Pulmonary Function Assessment - 07/18/16 1337      Breath   Bilateral Breath Sounds Wheezes;Clear   Shortness of Breath Limiting activity;Yes      Exercise Target Goals:    Exercise Program Goal: Individual exercise prescription set with THRR, safety & activity barriers. Participant demonstrates ability to understand and report RPE using BORG scale, to self-measure pulse accurately, and to acknowledge the importance of the exercise prescription.  Exercise Prescription Goal: Starting with aerobic activity 30 plus minutes a day, 3 days per week for initial exercise prescription. Provide home exercise prescription and guidelines that participant acknowledges understanding prior to discharge.  Activity Barriers & Risk Stratification:     Activity Barriers  & Cardiac Risk Stratification - 07/18/16 1335      Activity Barriers & Cardiac Risk Stratification   Activity Barriers Arthritis;Left Knee Replacement;Shortness of Breath;Deconditioning;Muscular Weakness      6 Minute Walk:     6 Minute Walk    Row Name 07/21/16 1622         6 Minute Walk   Phase Initial     Distance 1320 feet     Walk Time 6 minutes     # of Rest Breaks 0     MPH 2.5     METS 2.91     RPE 13     Perceived Dyspnea  3     Symptoms No     Resting HR 86 bpm     Resting BP 130/70     Max Ex. HR 126 bpm     Max Ex. BP 182/66     2 Minute Post BP 138/80       Interval HR   Baseline HR 86     1 Minute HR 95     2 Minute HR 118     3 Minute HR 120     4 Minute HR 124     5 Minute HR 122     6 Minute HR 126     2 Minute Post HR 95     Interval Heart Rate? Yes       Interval Oxygen   Interval Oxygen? Yes     Baseline Oxygen Saturation % 95 %     Baseline Liters of Oxygen 3 L     1 Minute Oxygen Saturation % 91 %     1 Minute Liters of Oxygen 3 L     2 Minute Oxygen Saturation % 90 %     2 Minute Liters of Oxygen 3 L     3 Minute Oxygen Saturation % 90 %     3 Minute Liters of Oxygen 3 L     4 Minute Oxygen Saturation % 90 %     4 Minute Liters of Oxygen 3 L     5 Minute Oxygen Saturation % 88 %     5 Minute Liters of Oxygen 3 L     6 Minute Oxygen Saturation % 88 %     6 Minute Liters of Oxygen 3 L     2 Minute Post Oxygen Saturation % 98 %     2 Minute Post Liters of Oxygen 3 L        Oxygen Initial Assessment:     Oxygen Initial Assessment - 08/15/16 0957      Home Oxygen   Home Oxygen Device E-Tanks;Home Concentrator   Sleep Oxygen Prescription Continuous   Liters per minute 2   Home Exercise Oxygen  Prescription Continuous   Home at Rest Exercise Oxygen Prescription Continuous   Compliance with Home Oxygen Use Yes     Program Oxygen Prescription   Program Oxygen Prescription Continuous     Intervention   Short Term Goals To  learn and exhibit compliance with exercise, home and travel O2 prescription;To learn and understand importance of monitoring SPO2 with pulse oximeter and demonstrate accurate use of the pulse oximeter.;To Learn and understand importance of maintaining oxygen saturations>88%;To learn and demonstrate proper purse lipped breathing techniques or other breathing techniques.;To learn and demonstrate proper use of respiratory medications   Long  Term Goals Exhibits compliance with exercise, home and travel O2 prescription      Oxygen Re-Evaluation:     Oxygen Re-Evaluation    Row Name 08/15/16 0957 09/12/16 1159           Program Oxygen Prescription   Program Oxygen Prescription  - Continuous      Liters per minute 3 3        Home Oxygen   Home Oxygen Device  - E-Tanks;Home Concentrator      Sleep Oxygen Prescription  - Continuous      Liters per minute  - 2      Home Exercise Oxygen Prescription  - Continuous      Liters per minute 3 3      Home at Rest Exercise Oxygen Prescription  - Continuous      Liters per minute 3 3      Compliance with Home Oxygen Use  - Yes        Goals/Expected Outcomes   Short Term Goals  - To learn and exhibit compliance with exercise, home and travel O2 prescription;To learn and understand importance of monitoring SPO2 with pulse oximeter and demonstrate accurate use of the pulse oximeter.;To Learn and understand importance of maintaining oxygen saturations>88%;To learn and demonstrate proper purse lipped breathing techniques or other breathing techniques.;To learn and demonstrate proper use of respiratory medications      Long  Term Goals  - Exhibits compliance with exercise, home and travel O2 prescription      Comments  - [name] is doing well adapting to continuous use of oxygen. She verbalized compliance with checking her oxygen saturations at home and wearing her oxygen most of the time. She states she will take it off while sitting  while still  maintaining saturations mid to high 90s.      Goals/Expected Outcomes  - exhibits/verbalizes continued compliance with exercise, home, and travel O2 prescription         Oxygen Discharge (Final Oxygen Re-Evaluation):     Oxygen Re-Evaluation - 09/12/16 1159      Program Oxygen Prescription   Program Oxygen Prescription Continuous   Liters per minute 3     Home Oxygen   Home Oxygen Device E-Tanks;Home Concentrator   Sleep Oxygen Prescription Continuous   Liters per minute 2   Home Exercise Oxygen Prescription Continuous   Liters per minute 3   Home at Rest Exercise Oxygen Prescription Continuous   Liters per minute 3   Compliance with Home Oxygen Use Yes     Goals/Expected Outcomes   Short Term Goals To learn and exhibit compliance with exercise, home and travel O2 prescription;To learn and understand importance of monitoring SPO2 with pulse oximeter and demonstrate accurate use of the pulse oximeter.;To Learn and understand importance of maintaining oxygen saturations>88%;To learn and demonstrate proper purse lipped breathing techniques or other breathing techniques.;To  learn and demonstrate proper use of respiratory medications   Long  Term Goals Exhibits compliance with exercise, home and travel O2 prescription   Comments [name] is doing well adapting to continuous use of oxygen. She verbalized compliance with checking her oxygen saturations at home and wearing her oxygen most of the time. She states she will take it off while sitting  while still maintaining saturations mid to high 90s.   Goals/Expected Outcomes exhibits/verbalizes continued compliance with exercise, home, and travel O2 prescription      Initial Exercise Prescription:     Initial Exercise Prescription - 07/21/16 1600      Date of Initial Exercise RX and Referring Provider   Date 07/14/16   Referring Provider Dr. Lake Bells     Oxygen   Oxygen Continuous   Liters 3     NuStep   Level 2   Minutes 17    METs 1.5     Arm Ergometer   Level 1   Minutes 17     Track   Laps 8   Minutes 17     Prescription Details   Frequency (times per week) 2   Duration Progress to 45 minutes of aerobic exercise without signs/symptoms of physical distress     Intensity   THRR 40-80% of Max Heartrate 58-115   Ratings of Perceived Exertion 11-13   Perceived Dyspnea 0-4     Progression   Progression Continue progressive overload as per policy without signs/symptoms or physical distress.     Resistance Training   Training Prescription Yes   Weight blue bands   Reps 10-12      Perform Capillary Blood Glucose checks as needed.  Exercise Prescription Changes:     Exercise Prescription Changes    Row Name 07/28/16 1240 08/02/16 1200 08/04/16 1200 08/11/16 1300 08/18/16 1300     Response to Exercise   Blood Pressure (Admit) 148/69 124/60 128/60 140/66 120/60   Blood Pressure (Exercise) 150/60 138/52 130/60 160/84 164/66   Blood Pressure (Exit) 140/62 126/60 112/60 124/80 124/66   Heart Rate (Admit) 95 bpm 84 bpm 90 bpm 85 bpm 99 bpm   Heart Rate (Exercise) 113 bpm 107 bpm 106 bpm 105 bpm 126 bpm   Heart Rate (Exit) 84 bpm 80 bpm 83 bpm 91 bpm 98 bpm   Oxygen Saturation (Admit) 98 % 100 % 98 % 98 % 98 %   Oxygen Saturation (Exercise) 95 % 93 % 93 % 93 % 88 %   Oxygen Saturation (Exit) 97 % 98 % 99 % 91 % 98 %   Rating of Perceived Exertion (Exercise) _0 Perceived Dyspnea (Exercise) _1 Duration Progress to 45 minutes of aerobic exercise without signs/symptoms of physical distress Progress to 45 minutes of aerobic exercise without signs/symptoms of physical distress Progress to 45 minutes of aerobic exercise without signs/symptoms of physical distress Progress to 45 minutes of aerobic exercise without signs/symptoms of physical distress Progress to 45 minutes of aerobic exercise without signs/symptoms of physical distress   Intensity Other (comment)  40-80 HRR THRR  unchanged THRR unchanged THRR unchanged THRR unchanged     Progression   Progression Continue to progress workloads to maintain intensity without signs/symptoms of physical distress. Continue to progress workloads to maintain intensity without signs/symptoms of physical distress. Continue to progress workloads to maintain intensity without signs/symptoms of physical distress. Continue to progress workloads to maintain intensity without signs/symptoms of physical distress.  Continue to progress workloads to maintain intensity without signs/symptoms of physical distress.     Resistance Training   Training Prescription _0    Weight _1    Reps 10-12 10-15 10-15 10-15 10-15   Time  - 10 Minutes 10 Minutes 10 Minutes 10 Minutes     Interval Training   Interval Training  -  - No No No     Oxygen   Oxygen _2    Liters _3 Recumbant Bike   Level  - _4 Minutes  - _5 NuStep   Level  - 2  -  -  -   Minutes  - 17  -  -  -   METs  - 1.7  -  -  -     Arm Ergometer   Level 1  -  -  -  -   Minutes 17  -  -  -  -     Track   Laps -  pt did not count steps _6 Minutes _7 Row Name 08/23/16 1229 08/25/16 1200 08/30/16 1239 09/01/16 1200 09/06/16 1200     Response to Exercise   Blood Pressure (Admit) 128/68 134/56 152/64 140/72 140/58   Blood Pressure (Exercise) 154/80 150/60 150/80 138/76 142/78   Blood Pressure (Exit) 104/60 116/78 142/90 124/60 138/78   Heart Rate (Admit) 92 bpm 80 bpm 97 bpm 86 bpm 87 bpm   Heart Rate (Exercise) 108 bpm 107 bpm 119 bpm 106 bpm 115 bpm   Heart Rate (Exit) 77 bpm 77 bpm 87 bpm 81 bpm 91 bpm   Oxygen Saturation (Admit) 98 % 99 % 99 % 96 % 98 %   Oxygen Saturation (Exercise) 92 % 94 % 96 % 94 % 94 %   Oxygen Saturation (Exit) 98 % 99 % 99 % 96 % 97 %   Rating of Perceived Exertion  (Exercise) _8 Perceived Dyspnea (Exercise) _9 Duration Progress to 45 minutes of aerobic exercise without signs/symptoms of physical distress Progress to 45 minutes of aerobic exercise without signs/symptoms of physical distress Progress to 45 minutes of aerobic exercise without signs/symptoms of physical distress Progress to 45 minutes of aerobic exercise without signs/symptoms of physical distress Progress to 45 minutes of aerobic exercise without signs/symptoms of physical distress   Intensity _10      Progression   Progression Continue to progress workloads to maintain intensity without signs/symptoms of physical distress. Continue to progress workloads to maintain intensity without signs/symptoms of physical distress. Continue to progress workloads to maintain intensity without signs/symptoms of physical distress. Continue to progress workloads to maintain intensity without signs/symptoms of physical distress. Continue to progress workloads to maintain intensity without signs/symptoms of physical distress.     Resistance Training   Training Prescription _11    Weight _12    Reps 10-15 10-15 10-15 10-15 10-15   Time 10 Minutes 10 Minutes 10 Minutes 10 Minutes 10 Minutes     Interval Training   Interval Training _13      Oxygen  Oxygen _0    Liters _1 Recumbant Bike   Level _2 Minutes _3 NuStep   Level 3  - _4 Minutes 17  - _5 METs 1.7  - 1.8 1.7 1.8     Track   Laps _6 - 11   Minutes _7 - 17   Row Name 09/08/16 1247             Response to Exercise   Blood Pressure (Admit) 140/70       Blood Pressure (Exercise) 160/70       Blood Pressure (Exit) 124/60       Heart Rate (Admit) 82 bpm        Heart Rate (Exercise) 137 bpm       Heart Rate (Exit) 96 bpm       Oxygen Saturation (Admit) 98 %       Oxygen Saturation (Exercise) 90 %       Oxygen Saturation (Exit) 98 %       Rating of Perceived Exertion (Exercise) 13       Perceived Dyspnea (Exercise) 1       Duration Progress to 45 minutes of aerobic exercise without signs/symptoms of physical distress       Intensity THRR unchanged         Progression   Progression Continue to progress workloads to maintain intensity without signs/symptoms of physical distress.         Resistance Training   Training Prescription Yes       Weight blue bands       Reps 10-15       Time 10 Minutes         Interval Training   Interval Training No         Oxygen   Oxygen Continuous       Liters 3         Recumbant Bike   Level 3       Minutes 17         Track   Laps 14       Minutes 17          Exercise Comments:   Exercise Goals and Review:   Exercise Goals Re-Evaluation :     Exercise Goals Re-Evaluation    Row Name 08/15/16 1606 09/13/16 0716 09/13/16 0719         Exercise Goal Re-Evaluation   Exercise Goals Review Increase Strenth and Stamina;Increase Physical Activity Increase Physical Activity;Increase Strenth and Stamina  -     Comments Patient has only attended four exercise sessions. Has a hard time pushing herself. 1.7 MET average on Stepper. Will cont. to monitor and progress.  Patient is not making many gains in Pulmonary Rehab. Patient has been maintaining 11-14 laps (200 feet each) on track in 15 minutes since 07/28/16 (start of program). 1.7 METS on stepper  -     Expected Outcomes Through the exercise here at rehab the patient with increase physical capacity, strength, and stamina.  - Through the exercise here at rehab the patient with increase physical capacity, strength, and stamina.        Discharge Exercise Prescription (Final Exercise Prescription Changes):     Exercise Prescription Changes - 09/08/16  1247  Response to Exercise   Blood Pressure (Admit) 140/70   Blood Pressure (Exercise) 160/70   Blood Pressure (Exit) 124/60   Heart Rate (Admit) 82 bpm   Heart Rate (Exercise) 137 bpm   Heart Rate (Exit) 96 bpm   Oxygen Saturation (Admit) 98 %   Oxygen Saturation (Exercise) 90 %   Oxygen Saturation (Exit) 98 %   Rating of Perceived Exertion (Exercise) 13   Perceived Dyspnea (Exercise) 1   Duration Progress to 45 minutes of aerobic exercise without signs/symptoms of physical distress   Intensity THRR unchanged     Progression   Progression Continue to progress workloads to maintain intensity without signs/symptoms of physical distress.     Resistance Training   Training Prescription Yes   Weight blue bands   Reps 10-15   Time 10 Minutes     Interval Training   Interval Training No     Oxygen   Oxygen Continuous   Liters 3     Recumbant Bike   Level 3   Minutes 17     Track   Laps 14   Minutes 17      Nutrition:  Target Goals: Understanding of nutrition guidelines, daily intake of sodium <1561m, cholesterol <2060m calories 30% from fat and 7% or less from saturated fats, daily to have 5 or more servings of fruits and vegetables.  Biometrics:     Pre Biometrics - 07/18/16 1339      Pre Biometrics   Grip Strength 33 kg       Nutrition Therapy Plan and Nutrition Goals:     Nutrition Therapy & Goals - 09/01/16 1323      Nutrition Therapy   Diet Therapeutic Lifestyle Changes     Personal Nutrition Goals   Nutrition Goal Maintain wt ~227 lb while in Pulmonary Rehab     Intervention Plan   Intervention Prescribe, educate and counsel regarding individualized specific dietary modifications aiming towards targeted core components such as weight, hypertension, lipid management, diabetes, heart failure and other comorbidities.;Nutrition handout(s) given to patient.  Low Sodium Nutrition Therapy   Expected Outcomes Short Term Goal: Understand basic  principles of dietary content, such as calories, fat, sodium, cholesterol and nutrients.;Long Term Goal: Adherence to prescribed nutrition plan.      Nutrition Discharge: Rate Your Plate Scores:     Nutrition Assessments - 09/01/16 1212      Rate Your Plate Scores   Pre Score 51      Nutrition Goals Re-Evaluation:   Nutrition Goals Discharge (Final Nutrition Goals Re-Evaluation):   Psychosocial: Target Goals: Acknowledge presence or absence of significant depression and/or stress, maximize coping skills, provide positive support system. Participant is able to verbalize types and ability to use techniques and skills needed for reducing stress and depression.  Initial Review & Psychosocial Screening:     Initial Psych Review & Screening - 07/18/16 1341      Initial Review   Current issues with --  none identified     Family Dynamics   Good Support System? Yes     Barriers   Psychosocial barriers to participate in program There are no identifiable barriers or psychosocial needs.     Screening Interventions   Interventions Encouraged to exercise      Quality of Life Scores:   PHQ-9: Recent Review Flowsheet Data    [facility] 07/18/2016 02/16/2015   Decreased Interest 0 0   Down, Depressed, Hopeless 0 0   PHQ - 2  Score 0 0     Interpretation of Total Score  Total Score Depression Severity:  1-4 = Minimal depression, 5-9 = Mild depression, 10-14 = Moderate depression, 15-19 = Moderately severe depression, 20-27 = Severe depression   Psychosocial Evaluation and Intervention:     Psychosocial Evaluation - 07/18/16 1342      Psychosocial Evaluation & Interventions   Interventions Encouraged to exercise with the program and follow exercise prescription   Continue Psychosocial Services  No      Psychosocial Re-Evaluation:     Psychosocial Re-Evaluation    [place] Name 08/15/16 1019 08/15/16 1213 09/12/16 1211         Psychosocial  Re-Evaluation   Current issues with None Identified  - None Identified     Expected Outcomes  - patient will remain free from psycosocial barriers  patient will remain free from psycosocial barriers      Interventions Encouraged to attend Pulmonary Rehabilitation for the exercise  - Encouraged to attend Pulmonary Rehabilitation for the exercise     Continue Psychosocial Services  No Follow up required  - No Follow up required        Psychosocial Discharge (Final Psychosocial Re-Evaluation):     Psychosocial Re-Evaluation - 09/12/16 1211      Psychosocial Re-Evaluation   Current issues with None Identified   Expected Outcomes patient will remain free from psycosocial barriers    Interventions Encouraged to attend Pulmonary Rehabilitation for the exercise   Continue Psychosocial Services  No Follow up required      Education: Education Goals: Education classes will be provided on a weekly basis, covering required topics. Participant will state understanding/return demonstration of topics presented.  Learning Barriers/Preferences:     Learning Barriers/Preferences - 07/18/16 1337      Learning Barriers/Preferences   Learning Barriers None   Learning Preferences Audio;Group Instruction;Individual Instruction;Pictoral;Skilled Demonstration;Verbal Instruction      Education Topics: Risk Factor Reduction:  -Group instruction that is supported by a PowerPoint presentation. Instructor discusses the definition of a risk factor, different risk factors for pulmonary disease, and how the heart and lungs work together.     PULMONARY REHAB OTHER RESPIRATORY from 09/08/2016 in [place]  Date  08/25/16  Educator  EP  Instruction Review Code  2- meets goals/outcomes      Nutrition for Pulmonary Patient:  -Group instruction provided by PowerPoint slides, verbal discussion, and written materials to support subject matter. The instructor gives an explanation  and review of healthy diet recommendations, which includes a discussion on weight management, recommendations for fruit and vegetable consumption, as well as protein, fluid, caffeine, fiber, sodium, sugar, and alcohol. Tips for eating when patients are short of breath are discussed.   PULMONARY REHAB OTHER RESPIRATORY from 09/08/2016 in [place]  Date  08/18/16  Educator  RD  Instruction Review Code  2- meets goals/outcomes      Pursed Lip Breathing:  -Group instruction that is supported by demonstration and informational handouts. Instructor discusses the benefits of pursed lip and diaphragmatic breathing and detailed demonstration on how to preform both.     Oxygen Safety:  -Group instruction provided by PowerPoint, verbal discussion, and written material to support subject matter. There is an overview of "What is Oxygen" and "Why do we need it".  Instructor also reviews how to create a safe environment for oxygen use, the importance of using oxygen as prescribed, and the risks of noncompliance. There is a  brief discussion on traveling with oxygen and resources the patient may utilize.   PULMONARY REHAB OTHER RESPIRATORY from 09/08/2016 in [place]  Date  09/08/16  Educator  Truddie Crumble  Instruction Review Code  2- meets goals/outcomes      Oxygen Equipment:  -Group instruction provided by [employer] Staff utilizing handouts, written materials, and equipment demonstrations.   Signs and Symptoms:  -Group instruction provided by written material and verbal discussion to support subject matter. Warning signs and symptoms of infection, stroke, and heart attack are reviewed and when to call the physician/911 reinforced. Tips for preventing the spread of infection discussed.   Advanced Directives:  -Group instruction provided by verbal instruction and written material to support subject matter. Instructor reviews Advanced Directive  laws and proper instruction for filling out document.   Pulmonary Video:  -Group video education that reviews the importance of medication and oxygen compliance, exercise, good nutrition, pulmonary hygiene, and pursed lip and diaphragmatic breathing for the pulmonary patient.   PULMONARY REHAB OTHER RESPIRATORY from 09/08/2016 in [place]  Date  07/28/16  Instruction Review Code  2- meets goals/outcomes      Exercise for the Pulmonary Patient:  -Group instruction that is supported by a PowerPoint presentation. Instructor discusses benefits of exercise, core components of exercise, frequency, duration, and intensity of an exercise routine, importance of utilizing pulse oximetry during exercise, safety while exercising, and options of places to exercise outside of rehab.     Pulmonary Medications:  -Verbally interactive group education provided by instructor with focus on inhaled medications and proper administration.   PULMONARY REHAB OTHER RESPIRATORY from 09/08/2016 in [place]  Date  09/01/16  Educator  pharm  Instruction Review Code  2- meets goals/outcomes      Anatomy and Physiology of the Respiratory System and Intimacy:  -Group instruction provided by PowerPoint, verbal discussion, and written material to support subject matter. Instructor reviews respiratory cycle and anatomical components of the respiratory system and their functions. Instructor also reviews differences in obstructive and restrictive respiratory diseases with examples of each. Intimacy, Sex, and Sexuality differences are reviewed with a discussion on how relationships can change when diagnosed with pulmonary disease. Common sexual concerns are reviewed.   PULMONARY REHAB OTHER RESPIRATORY from 09/08/2016 in [place]  Date  08/11/16  Educator  RN  Instruction Review Code  2- meets goals/outcomes      Knowledge  Questionnaire Score:   Core Components/Risk Factors/Patient Goals at Admission:     Personal Goals and Risk Factors at Admission - 07/18/16 1339      Core Components/Risk Factors/Patient Goals on Admission   Increase Strength and Stamina Yes   Improve shortness of breath with ADL's Yes   Develop more efficient breathing techniques such as purse lipped breathing and diaphragmatic breathing; and practicing self-pacing with activity Yes   Increase knowledge of respiratory medications and ability to use respiratory devices properly  Yes      Core Components/Risk Factors/Patient Goals Review:      Goals and Risk Factor Review    Row Name 07/18/16 1340 08/15/16 1009 09/12/16 1211         Core Components/Risk Factors/Patient Goals Review   Personal Goals Review Increase Strength and Stamina;Improve shortness of breath with ADL's;Develop more efficient breathing techniques such as purse lipped breathing and diaphragmatic breathing and practicing self-pacing with activity.;Increase knowledge of respiratory medications and ability to  use respiratory devices properly. Increase knowledge of respiratory medications and ability to use respiratory devices properly. Increase knowledge of respiratory medications and ability to use respiratory devices properly.     Review  - see comments section in ITP see comments section in ITP     Expected Outcomes  - see expected outcomes on Admission ITP see expected outcomes on Admission ITP        Core Components/Risk Factors/Patient Goals at Discharge (Final Review):      Goals and Risk Factor Review - 09/12/16 1211      Core Components/Risk Factors/Patient Goals Review   Personal Goals Review Increase knowledge of respiratory medications and ability to use respiratory devices properly.   Review see comments section in ITP   Expected Outcomes see expected outcomes on Admission ITP      ITP Comments:   Comments: ITP REVIEW Pt is making slow  progress toward pulmonary rehab goals after completing 11 sessions. She has to continuously be reminded to purse lip breath and although workloads are increases she remains at the same METS. [name] appears to have some memory deficits and is unable to retain the education taught during the program. Will continue to reinforce education. Recommend continued exercise, life style modification, education, and utilization of breathing techniques to increase stamina and strength and decrease shortness of breath with exertion.

## 2016-09-13 NOTE — Assessment & Plan Note (Signed)
It is not clear to me if the increase her oxygen need this year is just a side effect from the pneumonia she experienced earlier this year or if this represents worsening interstitial lung disease.  She's had a point now where I think we should get a lung function testing high-resolution CT scan of the chest to see if the ILD is worse.  Plan: High-resolution CT scan of the chest Pulmonary function to

## 2016-09-13 NOTE — Patient Instructions (Addendum)
My favorite over the counter allergy regimen is: Use Neil Med rinses with distilled water at least twice per day using the instructions on the package. 1/2 hour after using the [facility] Med rinse, use Nasacort two puffs in each nostril once per day.  Remember that the Nasacort can take 1-2 weeks to work after regular use. Use generic zyrtec (cetirizine) every day.  If this doesn't help, then stop taking it and use chlorpheniramine-phenylephrine combination tablets.  For your interstitial lung disease: We will order a lung function testing high-resolution CT scan of the chest  For your chronic respiratory failure with hypoxemia: Keep using your oxygen as you're doing  We will see you back in 4-6 weeks or sooner if needed

## 2016-09-13 NOTE — Assessment & Plan Note (Signed)
Allergic rhinitis related  Recommended: Use Neil Med rinses with distilled water at least twice per day using the instructions on the package. 1/2 hour after using the [facility] Med rinse, use Nasacort two puffs in each nostril once per day.  Remember that the Nasacort can take 1-2 weeks to work after regular use. Use generic zyrtec (cetirizine) every day.  If this doesn't help, then stop taking it and use chlorpheniramine-phenylephrine combination tablets.

## 2016-09-13 NOTE — Progress Notes (Signed)
Subjective:    Patient ID: [name], female    DOB: 12-12-1940, 76 y.o.   MRN: [id number]  Synopsis: Former patient of Dr. 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 As summarized by Dr. Gwenette Greet: CT chest 2008:  Mild bronchiectasis, ?NSIP: CXR 05/2014:  Progressive IS changes compared to 2012. 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  HRCT 2016:  Changes c/w fibrotic NSIP?  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 The patient's lowest oxygen saturation was 90 %, highest heart rate was 118 bpm , and highest blood pressure was 180/80 March 2017 pulmonary function testing ratio 88%, FVC 2.03 L (84% predicted), total lung capacity 3.0 L (54% predicted), DLCO 14.7 (51% predicted)  HPI Chief Complaint  Patient presents with  . Follow-up    pt c/o worsening sob, R sided chest discomfort, nonprod cough, nasal dryness.     [name] hasn't been feeling quite right since the last visit.   She feels that ever since she had the episode of shingles she hasn't been right.  She continues to struggle with some intermittent redness in the same distribution as the Shingles. She says that her breathing is harder when doing day to day activities.  The oxygen really helps, but if she takes it off and tries to walk around she has trouble.  However at rest she does OK without it.   She thinks that exercise at pulmonary rehab is working  well for her.  She is getting stronger.   She is still taking her nebulizer because it helps her breathe a little better.    Past Medical History:  Diagnosis Date  . Allergic rhinitis    takes Claritin nightly  . Anxiety    but not on any meds  . Arthritis   . Asthma   . Carpal tunnel syndrome   . GERD (gastroesophageal reflux disease)   . History of bronchitis 3-67yr ago  . History of colon polyps    benign  . Joint pain   . Peripheral edema    takes Lasix daily  . Shortness of breath dyspnea    with exertion;Albuterol inhaler as needed  . Thyroid nodule       Review of Systems  Constitutional: Negative for chills, fatigue and fever.  HENT: Negative for postnasal drip, rhinorrhea and sinus pressure.   Respiratory: Negative for cough, shortness of breath and wheezing.   Cardiovascular: Negative for chest pain, palpitations and leg swelling.       Objective:   Physical Exam Vitals:   09/13/16 1401  BP: 134/62  Pulse: 77  SpO2: 97%  Weight: 228 lb (103.4 kg)  Height: _0  (1.575 m)   3L pulse flow  Gen: chronically appearing HENT: OP clear, TM's clear, neck supple PULM: Crackles wheezes upper lobe predominant B, normal percussion CV: RRR, no mgr, trace edema GI: BS+, soft, nontender Derm: no cyanosis or rash, mild bruising (1-2cm diameter) over R  breast medial aspect, no palpable mass or tenderness Psyche: normal mood and affect   CBC    Component Value Date/Time   WBC 10.9 (H) 06/18/2016 0507   RBC 4.26 06/18/2016 0507   HGB 12.5 06/18/2016 0507   HCT 37.9 06/18/2016 0507   PLT 433 (H) 06/18/2016 0507   MCV 89.0 06/18/2016 0507   MCH 29.3 06/18/2016 0507   MCHC 33.0 06/18/2016 0507   RDW 15.3 06/18/2016 0507   MONOABS 0.8 (H) 02/06/2007 1043   EOSABS 0.4 02/06/2007 1043   BASOSABS 0.1 02/06/2007 1043   Imaging: 06/2016 CT angiogram> no PE, superimposed ground glass changes in addition to baseline pulmonary fibrosis  07/2016 6 MW> 1320 feet 3 L  O2 88%     Assessment & Plan:  Interstitial lung disease ([place]) It is not clear to me if the increase her oxygen need this year is just a side effect from the pneumonia she experienced earlier this year or if this represents worsening interstitial lung disease.  She's had a point now where I think we should get a lung function testing high-resolution CT scan of the chest to see if the ILD is worse.  Plan: High-resolution CT scan of the chest Pulmonary function to  Acute on chronic respiratory failure with hypoxia (HCC) Currently needs room air at rest, 3 L with sleep, and 3 L pulse with exertion  Recurrent acute sinusitis Allergic rhinitis related  Recommended: Use Neil Med rinses with distilled water at least twice per day using the instructions on the package. 1/2 hour after using the [facility] Med rinse, use Nasacort two puffs in each nostril once per day.  Remember that the Nasacort can take 1-2 weeks to work after regular use. Use generic zyrtec (cetirizine) every day.  If this doesn't help, then stop taking it and use chlorpheniramine-phenylephrine combination tablets.   > 50% of this 28 minute visit spent face to face   Current Outpatient Prescriptions:  .  albuterol (PROVENTIL HFA;VENTOLIN HFA) 108 (90 BASE) MCG/ACT inhaler, Inhale 2 puffs into the lungs every 6 (six) hours as needed for wheezing or shortness of breath. , Disp: , Rfl:  .  albuterol (PROVENTIL) (2.5 MG/3ML) 0.083% nebulizer solution, Take 3 mLs (2.5 mg total) by nebulization every 6 (six) hours as needed for wheezing or shortness of breath., Disp: 360 mL, Rfl: 5 .  aspirin 325 MG tablet, Take 650 mg by mouth every 6 (six) hours as needed for mild pain or moderate pain. , Disp: , Rfl:  .  furosemide (LASIX) 20 MG tablet, Take 20 mg by mouth as needed for fluid. , Disp: , Rfl:  .  ibuprofen (ADVIL,MOTRIN) 200 MG tablet, Take 400 mg by mouth every 6 (six) hours as needed for mild pain or moderate pain. , Disp: , Rfl:    .  loratadine (CLARITIN) 5 MG chewable tablet, Chew 5 mg by mouth daily as needed for allergies., Disp: , Rfl:  .  hydrOXYzine (ATARAX/VISTARIL) 10 MG tablet, Take 1 tablet (10 mg total) by mouth 3 (three) times daily as needed for anxiety. (Patient not taking: Reported on 09/13/2016), Disp: 30 tablet, Rfl: 0

## 2016-09-15 ENCOUNTER — Encounter (HOSPITAL_COMMUNITY)
Admission: RE | Admit: 2016-09-15 | Discharge: 2016-09-15 | Disposition: A | Discharge status: Home / Self Care | Payer: Medicare Other | Source: Ambulatory Visit | Attending: Pulmonary Disease | Admitting: Pulmonary Disease

## 2016-09-15 VITALS — Wt 230.2 lb

## 2016-09-15 DIAGNOSIS — J8410 Pulmonary fibrosis, unspecified: Secondary | ICD-10-CM | POA: Diagnosis not present

## 2016-09-15 DIAGNOSIS — J679 Hypersensitivity pneumonitis due to unspecified organic dust: Secondary | ICD-10-CM | POA: Diagnosis not present

## 2016-09-15 NOTE — Progress Notes (Signed)
Daily Session Note  Patient Details  Name: [name] MRN: [id number] Date of Birth: 14-Jul-1940 Referring Provider:     Pulmonary Rehab Walk Test from 07/21/2016 in [place]  Referring Provider  Dr. Lake Bells      Encounter Date: 09/15/2016  Check In:     Session Check In - 09/15/16 1030      Check-In   Location [facility]   Staff Present Rosebud Poles, RN, BSN;Molly diVincenzo, MS, ACSM RCEP, Exercise Physiologist;Lisa Ysidro Evert, RN;Lyanna Blystone Rollene Rotunda, RN, BSN   Supervising physician immediately available to respond to emergencies [facility] immediately available   Physician(s) Dr. Cathlean Sauer   Medication changes reported     No   Fall or balance concerns reported    No   Tobacco Cessation No Change   Warm-up and Cool-down Performed as group-led instruction   Resistance Training Performed Yes   VAD Patient? No     Pain Assessment   Currently in Pain? No/denies   Multiple Pain Sites No      Capillary Blood Glucose: No results found for this or any previous visit (from the past 24 hour(s)).      Exercise Prescription Changes - 09/15/16 1226      Response to Exercise   Blood Pressure (Admit) 152/60   Blood Pressure (Exercise) 140/70   Blood Pressure (Exit) 110/60   Heart Rate (Admit) 109 bpm   Heart Rate (Exercise) 116 bpm   Heart Rate (Exit) 86 bpm   Oxygen Saturation (Admit) 96 %   Oxygen Saturation (Exercise) 94 %   Oxygen Saturation (Exit) 97 %   Rating of Perceived Exertion (Exercise) 11   Perceived Dyspnea (Exercise) 2   Duration Progress to 45 minutes of aerobic exercise without signs/symptoms of physical distress   Intensity THRR unchanged     Progression   Progression Continue to progress workloads to maintain intensity without signs/symptoms of physical distress.     Resistance Training   Training Prescription Yes   Weight blue bands   Reps 10-15   Time 10 Minutes     Interval Training   Interval Training No     Oxygen   Oxygen Continuous   Liters 3     Recumbant Bike   Level 3   Minutes 17     NuStep   Level 4   Minutes 17   METs 2.1      History  Smoking Status  . Never Smoker  Smokeless Tobacco  . Never Used    Goals Met:  Improved SOB with ADL's Using PLB without cueing & demonstrates good technique Exercise tolerated well No report of cardiac concerns or symptoms Strength training completed today  Goals Unmet:  Not Applicable  Comments: Service time is from 1030 to 1205   Dr. Rush Farmer is Medical Director for Pulmonary Rehab at [facility].

## 2016-09-20 ENCOUNTER — Encounter (HOSPITAL_COMMUNITY)
Admission: RE | Admit: 2016-09-20 | Discharge: 2016-09-20 | Disposition: A | Discharge status: Home / Self Care | Payer: Medicare Other | Source: Ambulatory Visit | Attending: Pulmonary Disease | Admitting: Pulmonary Disease

## 2016-09-20 VITALS — Wt 228.8 lb

## 2016-09-20 DIAGNOSIS — J679 Hypersensitivity pneumonitis due to unspecified organic dust: Secondary | ICD-10-CM | POA: Diagnosis not present

## 2016-09-20 DIAGNOSIS — J8410 Pulmonary fibrosis, unspecified: Secondary | ICD-10-CM | POA: Diagnosis not present

## 2016-09-20 NOTE — Progress Notes (Signed)
Daily Session Note  Patient Details  Name: [name] MRN: [id number] Date of Birth: 12/06/1940 Referring Provider:     Pulmonary Rehab Walk Test from 07/21/2016 in [facility]  Referring Provider  Dr. McQuaid      Encounter Date: 09/20/2016  Check In:     Session Check In - 09/20/16 1030      Check-In   [facility]   Staff Present Joan Behrens, RN, BSN;Molly diVincenzo, MS, ACSM RCEP, Exercise Physiologist;Lisa Hughes, RN;Portia Payne, RN, BSN   Supervising physician immediately available to respond to emergencies [facility] immediately available   Physician(s) Dr. Arrien   Medication changes reported     No   Fall or balance concerns reported    No   Tobacco Cessation No Change   Warm-up and Cool-down Performed as group-led instruction   Resistance Training Performed Yes   VAD Patient? No     Pain Assessment   Currently in Pain? No/denies   Multiple Pain Sites No      Capillary Blood Glucose: No results found for this or any previous visit (from the past 24 hour(s)).      Exercise Prescription Changes - 09/20/16 1227      Response to Exercise   Blood Pressure (Admit) 136/64   Blood Pressure (Exercise) 136/70   Blood Pressure (Exit) 138/68   Heart Rate (Admit) 100 bpm   Heart Rate (Exercise) 127 bpm   Heart Rate (Exit) 97 bpm   Oxygen Saturation (Admit) 98 %   Oxygen Saturation (Exercise) 89 %   Oxygen Saturation (Exit) 95 %   Rating of Perceived Exertion (Exercise) 11   Perceived Dyspnea (Exercise) 2   Duration Progress to 45 minutes of aerobic exercise without signs/symptoms of physical distress   Intensity THRR unchanged     Progression   Progression Continue to progress workloads to maintain intensity without signs/symptoms of physical distress.     Resistance Training   Training Prescription Yes   Weight blue bands   Reps 10-15   Time 10 Minutes     Interval Training   Interval Training No     Oxygen   Oxygen Continuous   Liters 3     Recumbant Bike   Level 3   Minutes 17     NuStep   Level 4   Minutes 17   METs 1.8     Track   Laps 18   Minutes 17      History  Smoking Status  . Never Smoker  Smokeless Tobacco  . Never Used    Goals Met:  Improved SOB with ADL's Using PLB without cueing & demonstrates good technique Exercise tolerated well No report of cardiac concerns or symptoms Strength training completed today  Goals Unmet:  Not Applicable  Comments: Service time is from 1030 to 1210   Dr. Wesam G. Yacoub is Medical Director for Pulmonary Rehab at [facility]. 

## 2016-09-21 ENCOUNTER — Ambulatory Visit (INDEPENDENT_AMBULATORY_CARE_PROVIDER_SITE_OTHER)
Admission: RE | Admit: 2016-09-21 | Discharge: 2016-09-21 | Disposition: A | Discharge status: Home / Self Care | Payer: Medicare Other | Source: Ambulatory Visit | Attending: Pulmonary Disease | Admitting: Pulmonary Disease

## 2016-09-21 DIAGNOSIS — R0602 Shortness of breath: Secondary | ICD-10-CM | POA: Diagnosis not present

## 2016-09-21 DIAGNOSIS — J849 Interstitial pulmonary disease, unspecified: Secondary | ICD-10-CM | POA: Diagnosis not present

## 2016-09-22 ENCOUNTER — Encounter (HOSPITAL_COMMUNITY)
Admission: RE | Admit: 2016-09-22 | Discharge: 2016-09-22 | Disposition: A | Discharge status: Home / Self Care | Payer: Medicare Other | Source: Ambulatory Visit | Attending: Pulmonary Disease | Admitting: Pulmonary Disease

## 2016-09-22 VITALS — Wt 230.8 lb

## 2016-09-22 DIAGNOSIS — J8410 Pulmonary fibrosis, unspecified: Secondary | ICD-10-CM | POA: Diagnosis not present

## 2016-09-22 DIAGNOSIS — J679 Hypersensitivity pneumonitis due to unspecified organic dust: Secondary | ICD-10-CM | POA: Diagnosis not present

## 2016-09-22 NOTE — Progress Notes (Signed)
Daily Session Note  Patient Details  Name: [name] MRN: [id number] Date of Birth: 15-Oct-1940 Referring Provider:     Pulmonary Rehab Walk Test from 07/21/2016 in [place]  Referring Provider  Dr. Lake Bells      Encounter Date: 09/22/2016  Check In:     Session Check In - 09/22/16 1030      Check-In   [facility]   Staff Present Rosebud Poles, RN, BSN;Molly diVincenzo, MS, ACSM RCEP, Exercise Physiologist;Portia Rollene Rotunda, RN, BSN   Supervising physician immediately available to respond to emergencies [facility] immediately available   Physician(s) Dr. Candiss Norse   Medication changes reported     No   Fall or balance concerns reported    No   Tobacco Cessation No Change   Warm-up and Cool-down Performed as group-led instruction   Resistance Training Performed Yes   VAD Patient? No     Pain Assessment   Currently in Pain? No/denies   Multiple Pain Sites No      Capillary Blood Glucose: No results found for this or any previous visit (from the past 24 hour(s)).      Exercise Prescription Changes - 09/22/16 1200      Response to Exercise   Blood Pressure (Admit) 132/60   Blood Pressure (Exercise) 144/80   Blood Pressure (Exit) 124/64   Heart Rate (Admit) 91 bpm   Heart Rate (Exercise) 124 bpm   Heart Rate (Exit) 88 bpm   Oxygen Saturation (Admit) 96 %   Oxygen Saturation (Exercise) 90 %   Oxygen Saturation (Exit) 98 %   Rating of Perceived Exertion (Exercise) 12   Perceived Dyspnea (Exercise) 2   Duration Progress to 45 minutes of aerobic exercise without signs/symptoms of physical distress   Intensity THRR unchanged     Progression   Progression Continue to progress workloads to maintain intensity without signs/symptoms of physical distress.     Resistance Training   Training Prescription Yes   Weight blue bands   Reps 10-15   Time 10 Minutes     Interval Training   Interval Training No     Oxygen   Oxygen Continuous   Liters 3     NuStep   Level 4   Minutes 17   METs 1.9     Track   Laps 13   Minutes 17      History  Smoking Status  . Never Smoker  Smokeless Tobacco  . Never Used    Goals Met:  Exercise tolerated well Strength training completed today  Goals Unmet:  Not Applicable  Comments: Service time is from 1030 to 1220    Dr. Rush Farmer is Medical Director for Pulmonary Rehab at [facility]

## 2016-09-27 ENCOUNTER — Telehealth: Payer: Self-pay | Admitting: Pulmonary Disease

## 2016-09-27 ENCOUNTER — Ambulatory Visit (INDEPENDENT_AMBULATORY_CARE_PROVIDER_SITE_OTHER): Payer: Medicare Other | Admitting: Pulmonary Disease

## 2016-09-27 ENCOUNTER — Encounter (HOSPITAL_COMMUNITY): Admission: RE | Admit: 2016-09-27 | Payer: Medicare Other | Source: Ambulatory Visit

## 2016-09-27 DIAGNOSIS — J9621 Acute and chronic respiratory failure with hypoxia: Secondary | ICD-10-CM

## 2016-09-27 DIAGNOSIS — J8410 Pulmonary fibrosis, unspecified: Secondary | ICD-10-CM

## 2016-09-27 LAB — PULMONARY FUNCTION TEST
DL/VA % pred: 80 %
DL/VA: 4.17 ml/min/mmHg/L
DLCO cor % pred: 42 %
DLCO cor: 12.07 ml/min/mmHg
DLCO unc % pred: 44 %
DLCO unc: 12.69 ml/min/mmHg
FEF 25-75 Post: 2.47 L/sec
FEF 25-75 Pre: 2 L/sec
FEF2575-%Change-Post: 23 %
FEF2575-%Pred-Post: 142 %
FEF2575-%Pred-Pre: 114 %
FEV1-%Change-Post: 6 %
FEV1-%Pred-Post: 72 %
FEV1-%Pred-Pre: 68 %
FEV1-Post: 1.7 L
FEV1-Pre: 1.6 L
FEV1FVC-%Change-Post: 3 %
FEV1FVC-%Pred-Pre: 116 %
FEV6-%Change-Post: 1 %
FEV6-%Pred-Post: 63 %
FEV6-%Pred-Pre: 62 %
FEV6-Post: 1.89 L
FEV6-Pre: 1.85 L
FEV6FVC-%Change-Post: -1 %
FEV6FVC-%Pred-Post: 103 %
FEV6FVC-%Pred-Pre: 105 %
FVC-%Change-Post: 2 %
FVC-%Pred-Post: 61 %
FVC-%Pred-Pre: 59 %
FVC-Post: 1.91 L
FVC-Pre: 1.86 L
Post FEV1/FVC ratio: 89 %
Post FEV6/FVC ratio: 99 %
Pre FEV1/FVC ratio: 86 %
Pre FEV6/FVC Ratio: 100 %
RV % pred: 64 %
RV: 1.6 L
TLC % pred: 63 %
TLC: 3.49 L

## 2016-09-27 NOTE — Telephone Encounter (Signed)
lmomtcb x 1 for the pt to call back .

## 2016-09-28 NOTE — Telephone Encounter (Signed)
Patient returned phone call.[name] ° °

## 2016-09-28 NOTE — Telephone Encounter (Signed)
Spoke with pt, who request to switch from [facility] to [facility]. Pt states she is unhappy with [facility].  Pt seen BQ on 09/13/16, order was placed on 09/13/16 to eval pt for POC. Pt states when [facility] contacted her in regards to evaluating her, pt told [facility] she did wish to scheduled with them. Order has been placed to [facility] per pt request. Nothing further needed.  Will route to BQ as a FYI.

## 2016-09-29 ENCOUNTER — Encounter (HOSPITAL_COMMUNITY)
Admission: RE | Admit: 2016-09-29 | Discharge: 2016-09-29 | Disposition: A | Discharge status: Home / Self Care | Payer: Medicare Other | Source: Ambulatory Visit | Attending: Pulmonary Disease | Admitting: Pulmonary Disease

## 2016-09-29 VITALS — Wt 225.1 lb

## 2016-09-29 DIAGNOSIS — J679 Hypersensitivity pneumonitis due to unspecified organic dust: Secondary | ICD-10-CM | POA: Diagnosis not present

## 2016-09-29 DIAGNOSIS — J8410 Pulmonary fibrosis, unspecified: Secondary | ICD-10-CM | POA: Diagnosis not present

## 2016-09-29 NOTE — Progress Notes (Addendum)
Daily Session Note  Patient Details  Name: [name] MRN: [id number] Date of Birth: [birth year] Referring Provider:     Pulmonary Rehab Walk Test from 07/21/2016 in [place]  Referring Provider  Dr. Lake Bells      Encounter Date: 09/29/2016  Check In:     Session Check In - 09/29/16 1059      Check-In   [facility]   Staff Present Trish Fountain, RN, Maxcine Ham, RN, BSN;Lisa Hughes, RN;Johnson [place]ola, MS, ACSM RCEP, Exercise Physiologist   Supervising physician immediately available to respond to emergencies [facility] immediately available   Physician(s) Dr. Posey Pronto   Medication changes reported     No   Fall or balance concerns reported    No   Tobacco Cessation No Change   Warm-up and Cool-down Performed as group-led instruction   Resistance Training Performed Yes   VAD Patient? No     Pain Assessment   Currently in Pain? No/denies   Multiple Pain Sites No      Capillary Blood Glucose: No results found for this or any previous visit (from the past 24 hour(s)).      Exercise Prescription Changes - 09/29/16 1200      Response to Exercise   Blood Pressure (Admit) 154/66   Blood Pressure (Exercise) 158/60   Blood Pressure (Exit) 114/66   Heart Rate (Admit) 88 bpm   Heart Rate (Exercise) 123 bpm   Heart Rate (Exit) 96 bpm   Oxygen Saturation (Admit) 93 %   Oxygen Saturation (Exercise) 91 %   Oxygen Saturation (Exit) 97 %   Rating of Perceived Exertion (Exercise) 13   Perceived Dyspnea (Exercise) 1   Duration Progress to 45 minutes of aerobic exercise without signs/symptoms of physical distress   Intensity THRR unchanged     Progression   Progression Continue to progress workloads to maintain intensity without signs/symptoms of physical distress.     Resistance Training   Training Prescription Yes   Weight blue bands   Reps 10-15   Time 10 Minutes     Interval Training   Interval Training No     Oxygen   Oxygen Continuous   Liters 3     Recumbant Bike   Level 3   Minutes 17     Track   Laps 18   Minutes 17      History  Smoking Status  . Never Smoker  Smokeless Tobacco  . Never Used    Goals Met:  Exercise tolerated well No report of cardiac concerns or symptoms Strength training completed today  Goals Unmet:  Not Applicable  Comments: Service time is from 10:30a to 12:35p Patient attended education today with Dr. Nelda Marseille    Dr. Rush Farmer is Medical Director for Pulmonary Rehab at [facility].

## 2016-10-03 ENCOUNTER — Telehealth: Payer: Self-pay | Admitting: Pulmonary Disease

## 2016-10-03 DIAGNOSIS — J9621 Acute and chronic respiratory failure with hypoxia: Secondary | ICD-10-CM

## 2016-10-03 NOTE — Telephone Encounter (Signed)
Third order placed for this patient's 02. Detailed message left for Mandy at [facility] advising that new order has been placed.    Nothing further needed.

## 2016-10-03 NOTE — Telephone Encounter (Signed)
lmam for [facility] about this order

## 2016-10-03 NOTE — Telephone Encounter (Signed)
PCCs  I have re-done the order the way [facility] requested. Please be sure she gets the updated order

## 2016-10-03 NOTE — Telephone Encounter (Signed)
Mandy called back pt does not want a POC now she wants the normal kind now states POC is too loud so can we redo the order Per Leafy Ro she said she is so sorry [phone]

## 2016-10-04 ENCOUNTER — Encounter (HOSPITAL_COMMUNITY): Payer: Medicare Other

## 2016-10-04 DIAGNOSIS — H43813 Vitreous degeneration, bilateral: Secondary | ICD-10-CM | POA: Diagnosis not present

## 2016-10-04 DIAGNOSIS — H2513 Age-related nuclear cataract, bilateral: Secondary | ICD-10-CM | POA: Diagnosis not present

## 2016-10-04 DIAGNOSIS — H524 Presbyopia: Secondary | ICD-10-CM | POA: Diagnosis not present

## 2016-10-04 DIAGNOSIS — H25013 Cortical age-related cataract, bilateral: Secondary | ICD-10-CM | POA: Diagnosis not present

## 2016-10-06 ENCOUNTER — Telehealth: Payer: Self-pay | Admitting: Pulmonary Disease

## 2016-10-06 ENCOUNTER — Encounter (HOSPITAL_COMMUNITY)
Admission: RE | Admit: 2016-10-06 | Discharge: 2016-10-06 | Disposition: A | Discharge status: Home / Self Care | Payer: Medicare Other | Source: Ambulatory Visit | Attending: Pulmonary Disease | Admitting: Pulmonary Disease

## 2016-10-06 VITALS — Wt 229.3 lb

## 2016-10-06 DIAGNOSIS — J8410 Pulmonary fibrosis, unspecified: Secondary | ICD-10-CM | POA: Diagnosis not present

## 2016-10-06 DIAGNOSIS — J679 Hypersensitivity pneumonitis due to unspecified organic dust: Secondary | ICD-10-CM | POA: Insufficient documentation

## 2016-10-06 NOTE — Telephone Encounter (Signed)
[name], CMA Tue Sep 13, 2016 2:37 PM  SATURATION QUALIFICATIONS: (This note is used to comply with regulatory documentation for home oxygen) Patient Saturations on Room Air at Rest = 94% Patient Saturations on Room Air while Ambulating = 84% Patient Saturations on 2 Liters of oxygen while Ambulating = 92%     [name] is aware of results.

## 2016-10-06 NOTE — Progress Notes (Signed)
Daily Session Note  Patient Details  Name: [name] MRN: [id number] Date of Birth: 1941-04-13 Referring Provider:     Pulmonary Rehab Walk Test from 07/21/2016 in [place]  Referring Provider  Dr. Lake Bells      Encounter Date: 10/06/2016  Check In:     Session Check In - 10/06/16 1030      Check-In   Location [facility]   Staff Present Trish Fountain, RN, BSN;Ramon Dredge, RN, MHA;Joan Leonia Reeves, RN, BSN;Lisa Ysidro Evert, RN;Amber Fair, MS, ACSM RCEP, Exercise Physiologist   Supervising physician immediately available to respond to emergencies [facility] immediately available   Physician(s) Dr. Sloan Leiter   Medication changes reported     No   Fall or balance concerns reported    No   Tobacco Cessation No Change   Warm-up and Cool-down Performed as group-led instruction   Resistance Training Performed Yes   VAD Patient? No     Pain Assessment   Currently in Pain? No/denies   Multiple Pain Sites No      Capillary Blood Glucose: No results found for this or any previous visit (from the past 24 hour(s)).      Exercise Prescription Changes - 10/06/16 1302      Response to Exercise   Blood Pressure (Admit) 130/66   Blood Pressure (Exercise) 148/68   Blood Pressure (Exit) 130/63   Heart Rate (Admit) 93 bpm   Heart Rate (Exercise) 113 bpm   Heart Rate (Exit) 89 bpm   Oxygen Saturation (Admit) 98 %   Oxygen Saturation (Exercise) 92 %   Oxygen Saturation (Exit) 97 %   Rating of Perceived Exertion (Exercise) 11   Perceived Dyspnea (Exercise) 1   Duration Progress to 45 minutes of aerobic exercise without signs/symptoms of physical distress   Intensity THRR unchanged     Progression   Progression Continue to progress workloads to maintain intensity without signs/symptoms of physical distress.     Resistance Training   Training Prescription Yes   Weight blue bands   Reps 10-15   Time 10 Minutes     Interval Training   Interval Training No     Oxygen   Oxygen Continuous   Liters 3     NuStep   Level 4   Minutes 17   METs 1.8     Track   Laps 16   Minutes 17      History  Smoking Status  . Never Smoker  Smokeless Tobacco  . Never Used    Goals Met:  Exercise tolerated well Queuing for purse lip breathing No report of cardiac concerns or symptoms Strength training completed today  Goals Unmet:  Not Applicable  Comments: Service time is from 1030 to 1220   Dr. Rush Farmer is Medical Director for Pulmonary Rehab at [facility].

## 2016-10-08 DIAGNOSIS — G8929 Other chronic pain: Secondary | ICD-10-CM | POA: Diagnosis not present

## 2016-10-08 DIAGNOSIS — M19011 Primary osteoarthritis, right shoulder: Secondary | ICD-10-CM | POA: Diagnosis not present

## 2016-10-08 DIAGNOSIS — M25511 Pain in right shoulder: Secondary | ICD-10-CM | POA: Diagnosis not present

## 2016-10-10 ENCOUNTER — Ambulatory Visit (INDEPENDENT_AMBULATORY_CARE_PROVIDER_SITE_OTHER): Payer: Medicare Other | Admitting: Adult Health

## 2016-10-10 ENCOUNTER — Encounter: Payer: Self-pay | Admitting: Adult Health

## 2016-10-10 ENCOUNTER — Ambulatory Visit (INDEPENDENT_AMBULATORY_CARE_PROVIDER_SITE_OTHER)
Admission: RE | Admit: 2016-10-10 | Discharge: 2016-10-10 | Disposition: A | Discharge status: Home / Self Care | Payer: Medicare Other | Source: Ambulatory Visit | Attending: Adult Health | Admitting: Adult Health

## 2016-10-10 DIAGNOSIS — R0602 Shortness of breath: Secondary | ICD-10-CM | POA: Diagnosis not present

## 2016-10-10 DIAGNOSIS — J849 Interstitial pulmonary disease, unspecified: Secondary | ICD-10-CM | POA: Diagnosis not present

## 2016-10-10 MED ORDER — PREDNISONE 10 MG PO TABS: ORAL_TABLET | ORAL | 0 refills | Status: DC

## 2016-10-10 MED ORDER — AMOXICILLIN-POT CLAVULANATE 875-125 MG PO TABS: 1.0000 | ORAL_TABLET | Freq: Two times a day (BID) | ORAL | 0 refills | Status: AC

## 2016-10-10 NOTE — Addendum Note (Signed)
Addended by: Parke Poisson E on: 10/10/2016 03:56 PM   Modules accepted: Orders

## 2016-10-10 NOTE — Progress Notes (Signed)
Pulmonary Individual Treatment Plan  Patient Details  Name: [name] MRN: [id number] Date of Birth: 09-07-1940 Referring Provider:     Pulmonary Rehab Walk Test from 07/21/2016 in [place]  Referring Provider  Dr. Lake Bells      Initial Encounter Date:    Pulmonary Rehab Walk Test from 07/21/2016 in [place]  Date  07/14/16  Referring Provider  Dr. 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  albuterol (PROVENTIL HFA;VENTOLIN HFA) 108 (90 BASE) MCG/ACT inhaler, Inhale 2 puffs into the lungs every 6 (six) hours as needed for wheezing or shortness of breath. , Disp: , Rfl:  .  albuterol (PROVENTIL) (2.5 MG/3ML) 0.083% nebulizer solution, Take 3 mLs (2.5 mg total) by nebulization every 6 (six) hours as needed for wheezing or shortness of breath., Disp: 360 mL, Rfl: 5 .  amoxicillin-clavulanate (AUGMENTIN) 875-125 MG tablet, Take 1 tablet by mouth 2 (two) times daily., Disp: 14 tablet, Rfl: 0 .  aspirin 325 MG tablet, Take 650 mg by mouth every 6 (six) hours as needed for mild pain or moderate pain. , Disp: , Rfl:  .  fluticasone (FLONASE) 50 MCG/ACT nasal spray, Place 1 spray into both nostrils daily., Disp: , Rfl:  .  furosemide (LASIX) 20 MG tablet, Take 20 mg by mouth as needed for fluid. , Disp: , Rfl:  .  hydrOXYzine (ATARAX/VISTARIL) 10 MG tablet, Take 1 tablet (10 mg total) by mouth 3 (three) times daily as needed for anxiety., Disp: 30 tablet, Rfl: 0 .  ibuprofen (ADVIL,MOTRIN) 200 MG tablet, Take 400 mg by mouth every 6 (six) hours as needed for mild pain or moderate pain. , Disp: , Rfl:  .  loratadine (CLARITIN) 5 MG chewable tablet, Chew 5 mg by mouth daily as needed for allergies., Disp: , Rfl:  .  predniSONE (DELTASONE) 10 MG tablet, 4 tabs for 2  days, then 3 tabs for 2 days, 2 tabs for 2 days, then 1 tab for 2 days, then stop, Disp: 20 tablet, Rfl: 0  Past Medical History: Past Medical History:  Diagnosis Date  . Allergic rhinitis    takes Claritin nightly  . Anxiety    but not on any meds  . Arthritis   . Asthma   . Carpal tunnel syndrome   . GERD (gastroesophageal reflux disease)   . History of bronchitis 3-15yr ago  . History of colon polyps    benign  . Joint pain   . Peripheral edema    takes Lasix daily  . Shortness of breath dyspnea    with exertion;Albuterol inhaler as needed  . Thyroid nodule     Tobacco Use: History  Smoking Status  . Never Smoker  Smokeless Tobacco  . Never Used    Labs: Recent Review Flowsheet Data    Labs for ITP Cardiac and Pulmonary Rehab Latest Ref Rng & Units 11/18/2014 11/21/2014   PHART 7.350 - 7.450 7.426 7.356   PCO2ART 35.0 - 45.0 mmHg 40.7 49.1(H)   HCO3 20.0 - 24.0 mEq/L 26.3(H) 26.8(H)   TCO2 0 - 100 mmol/L 27.5 28.4   O2SAT % 96.8 96.2      Capillary Blood Glucose: No results found for: GLUCAP   ADL UCSD:   Pulmonary Function Assessment:     Pulmonary Function Assessment - 07/18/16 1337  Breath   Bilateral Breath Sounds Wheezes;Clear   Shortness of Breath Limiting activity;Yes      Exercise Target Goals:    Exercise Program Goal: Individual exercise prescription set with THRR, safety & activity barriers. Participant demonstrates ability to understand and report RPE using BORG scale, to self-measure pulse accurately, and to acknowledge the importance of the exercise prescription.  Exercise Prescription Goal: Starting with aerobic activity 30 plus minutes a day, 3 days per week for initial exercise prescription. Provide home exercise prescription and guidelines that participant acknowledges understanding prior to discharge.  Activity Barriers & Risk Stratification:     Activity Barriers & Cardiac Risk Stratification - 07/18/16 1335       Activity Barriers & Cardiac Risk Stratification   Activity Barriers Arthritis;Left Knee Replacement;Shortness of Breath;Deconditioning;Muscular Weakness      6 Minute Walk:     6 Minute Walk    Row Name 07/21/16 1622         6 Minute Walk   Phase Initial     Distance 1320 feet     Walk Time 6 minutes     # of Rest Breaks 0     MPH 2.5     METS 2.91     RPE 13     Perceived Dyspnea  3     Symptoms No     Resting HR 86 bpm     Resting BP 130/70     Max Ex. HR 126 bpm     Max Ex. BP 182/66     2 Minute Post BP 138/80       Interval HR   Baseline HR 86     1 Minute HR 95     2 Minute HR 118     3 Minute HR 120     4 Minute HR 124     5 Minute HR 122     6 Minute HR 126     2 Minute Post HR 95     Interval Heart Rate? Yes       Interval Oxygen   Interval Oxygen? Yes     Baseline Oxygen Saturation % 95 %     Baseline Liters of Oxygen 3 L     1 Minute Oxygen Saturation % 91 %     1 Minute Liters of Oxygen 3 L     2 Minute Oxygen Saturation % 90 %     2 Minute Liters of Oxygen 3 L     3 Minute Oxygen Saturation % 90 %     3 Minute Liters of Oxygen 3 L     4 Minute Oxygen Saturation % 90 %     4 Minute Liters of Oxygen 3 L     5 Minute Oxygen Saturation % 88 %     5 Minute Liters of Oxygen 3 L     6 Minute Oxygen Saturation % 88 %     6 Minute Liters of Oxygen 3 L     2 Minute Post Oxygen Saturation % 98 %     2 Minute Post Liters of Oxygen 3 L        Oxygen Initial Assessment:     Oxygen Initial Assessment - 08/15/16 0957      Home Oxygen   Home Oxygen Device E-Tanks;Home Concentrator   Sleep Oxygen Prescription Continuous   Liters per minute 2   Home Exercise Oxygen Prescription Continuous   Home at Rest Exercise Oxygen Prescription Continuous  Compliance with Home Oxygen Use Yes     Program Oxygen Prescription   Program Oxygen Prescription Continuous     Intervention   Short Term Goals To learn and exhibit compliance with exercise, home and  travel O2 prescription;To learn and understand importance of monitoring SPO2 with pulse oximeter and demonstrate accurate use of the pulse oximeter.;To Learn and understand importance of maintaining oxygen saturations>88%;To learn and demonstrate proper purse lipped breathing techniques or other breathing techniques.;To learn and demonstrate proper use of respiratory medications   Long  Term Goals Exhibits compliance with exercise, home and travel O2 prescription      Oxygen Re-Evaluation:     Oxygen Re-Evaluation    [place] Name 08/15/16 0957 09/12/16 1159 10/10/16 1655         Program Oxygen Prescription   Program Oxygen Prescription  - Continuous Continuous     Liters per minute _0 Home Oxygen   Home Oxygen Device  - E-Tanks;Home Concentrator E-Tanks;Home Concentrator     Sleep Oxygen Prescription  - Continuous Continuous     Liters per minute  - 2 2     Home Exercise Oxygen Prescription  - Continuous Continuous     Liters per minute _1 Home at Rest Exercise Oxygen Prescription  - Continuous Continuous     Liters per minute 3 3  -     Compliance with Home Oxygen Use  - Yes Yes       Goals/Expected Outcomes   Short Term Goals  - To learn and exhibit compliance with exercise, home and travel O2 prescription;To learn and understand importance of monitoring SPO2 with pulse oximeter and demonstrate accurate use of the pulse oximeter.;To Learn and understand importance of maintaining oxygen saturations>88%;To learn and demonstrate proper purse lipped breathing techniques or other breathing techniques.;To learn and demonstrate proper use of respiratory medications To learn and exhibit compliance with exercise, home and travel O2 prescription;To learn and understand importance of monitoring SPO2 with pulse oximeter and demonstrate accurate use of the pulse oximeter.;To Learn and understand importance of maintaining oxygen saturations>88%;To learn and demonstrate proper purse  lipped breathing techniques or other breathing techniques.;To learn and demonstrate proper use of respiratory medications     Long  Term Goals  - Exhibits compliance with exercise, home and travel O2 prescription Exhibits compliance with exercise, home and travel O2 prescription     Comments  - Dorrette is doing well adapting to continuous use of oxygen. She verbalized compliance with checking her oxygen saturations at home and wearing her oxygen most of the time. She states she will take it off while sitting  while still maintaining saturations mid to high 90s. Dorrette is doing well adapting to continuous use of oxygen. She verbalized compliance with checking her oxygen saturations at home and wearing her oxygen most of the time. She states she will take it off while sitting  while still maintaining saturations mid to high 90s. Will continue to reinforse the importants of turning off her portable etank when not in use as she has left it own during rehab.     Goals/Expected Outcomes  - exhibits/verbalizes continued compliance with exercise, home, and travel O2 prescription exhibits/verbalizes continued compliance with exercise, home, and travel O2 prescription        Oxygen Discharge (Final Oxygen Re-Evaluation):     Oxygen Re-Evaluation - 10/10/16 1655      Program Oxygen Prescription  Program Oxygen Prescription Continuous   Liters per minute 3     Home Oxygen   Home Oxygen Device E-Tanks;Home Concentrator   Sleep Oxygen Prescription Continuous   Liters per minute 2   Home Exercise Oxygen Prescription Continuous   Liters per minute 3   Home at Rest Exercise Oxygen Prescription Continuous   Compliance with Home Oxygen Use Yes     Goals/Expected Outcomes   Short Term Goals To learn and exhibit compliance with exercise, home and travel O2 prescription;To learn and understand importance of monitoring SPO2 with pulse oximeter and demonstrate accurate use of the pulse oximeter.;To Learn  and understand importance of maintaining oxygen saturations>88%;To learn and demonstrate proper purse lipped breathing techniques or other breathing techniques.;To learn and demonstrate proper use of respiratory medications   Long  Term Goals Exhibits compliance with exercise, home and travel O2 prescription   Comments Dorrette is doing well adapting to continuous use of oxygen. She verbalized compliance with checking her oxygen saturations at home and wearing her oxygen most of the time. She states she will take it off while sitting  while still maintaining saturations mid to high 90s. Will continue to reinforse the importants of turning off her portable etank when not in use as she has left it own during rehab.   Goals/Expected Outcomes exhibits/verbalizes continued compliance with exercise, home, and travel O2 prescription      Initial Exercise Prescription:     Initial Exercise Prescription - 07/21/16 1600      Date of Initial Exercise RX and Referring Provider   Date 07/14/16   Referring Provider Dr. Lake Bells     Oxygen   Oxygen Continuous   Liters 3     NuStep   Level 2   Minutes 17   METs 1.5     Arm Ergometer   Level 1   Minutes 17     Track   Laps 8   Minutes 17     Prescription Details   Frequency (times per week) 2   Duration Progress to 45 minutes of aerobic exercise without signs/symptoms of physical distress     Intensity   THRR 40-80% of Max Heartrate 58-115   Ratings of Perceived Exertion 11-13   Perceived Dyspnea 0-4     Progression   Progression Continue progressive overload as per policy without signs/symptoms or physical distress.     Resistance Training   Training Prescription Yes   Weight blue bands   Reps 10-12      Perform Capillary Blood Glucose checks as needed.  Exercise Prescription Changes:     Exercise Prescription Changes    Row Name 07/28/16 1240 08/02/16 1200 08/04/16 1200 08/11/16 1300 08/18/16 1300     Response to  Exercise   Blood Pressure (Admit) 148/69 124/60 128/60 140/66 120/60   Blood Pressure (Exercise) 150/60 138/52 130/60 160/84 164/66   Blood Pressure (Exit) 140/62 126/60 112/60 124/80 124/66   Heart Rate (Admit) 95 bpm 84 bpm 90 bpm 85 bpm 99 bpm   Heart Rate (Exercise) 113 bpm 107 bpm 106 bpm 105 bpm 126 bpm   Heart Rate (Exit) 84 bpm 80 bpm 83 bpm 91 bpm 98 bpm   Oxygen Saturation (Admit) 98 % 100 % 98 % 98 % 98 %   Oxygen Saturation (Exercise) 95 % 93 % 93 % 93 % 88 %   Oxygen Saturation (Exit) 97 % 98 % 99 % 91 % 98 %   Rating of Perceived Exertion (Exercise)  _0 Perceived Dyspnea (Exercise) _1 Duration Progress to 45 minutes of aerobic exercise without signs/symptoms of physical distress Progress to 45 minutes of aerobic exercise without signs/symptoms of physical distress Progress to 45 minutes of aerobic exercise without signs/symptoms of physical distress Progress to 45 minutes of aerobic exercise without signs/symptoms of physical distress Progress to 45 minutes of aerobic exercise without signs/symptoms of physical distress   Intensity Other (comment)  40-80 HRR THRR unchanged THRR unchanged THRR unchanged THRR unchanged     Progression   Progression Continue to progress workloads to maintain intensity without signs/symptoms of physical distress. Continue to progress workloads to maintain intensity without signs/symptoms of physical distress. Continue to progress workloads to maintain intensity without signs/symptoms of physical distress. Continue to progress workloads to maintain intensity without signs/symptoms of physical distress. Continue to progress workloads to maintain intensity without signs/symptoms of physical distress.     Resistance Training   Training Prescription _2    Weight _3    Reps 10-12 10-15 10-15 10-15 10-15   Time  - 10 Minutes 10 Minutes 10 Minutes 10 Minutes      Interval Training   Interval Training  -  - No No No     Oxygen   Oxygen _4    Liters _5 Recumbant Bike   Level  - _6 Minutes  - _7 NuStep   Level  - 2  -  -  -   Minutes  - 17  -  -  -   METs  - 1.7  -  -  -     Arm Ergometer   Level 1  -  -  -  -   Minutes 17  -  -  -  -     Track   Laps -  pt did not count steps _8 Minutes _9 Row Name 08/23/16 1229 08/25/16 1200 08/30/16 1239 09/01/16 1200 09/06/16 1200     Response to Exercise   Blood Pressure (Admit) 128/68 134/56 152/64 140/72 140/58   Blood Pressure (Exercise) 154/80 150/60 150/80 138/76 142/78   Blood Pressure (Exit) 104/60 116/78 142/90 124/60 138/78   Heart Rate (Admit) 92 bpm 80 bpm 97 bpm 86 bpm 87 bpm   Heart Rate (Exercise) 108 bpm 107 bpm 119 bpm 106 bpm 115 bpm   Heart Rate (Exit) 77 bpm 77 bpm 87 bpm 81 bpm 91 bpm   Oxygen Saturation (Admit) 98 % 99 % 99 % 96 % 98 %   Oxygen Saturation (Exercise) 92 % 94 % 96 % 94 % 94 %   Oxygen Saturation (Exit) 98 % 99 % 99 % 96 % 97 %   Rating of Perceived Exertion (Exercise) _10 Perceived Dyspnea (Exercise) _11 Duration Progress to 45 minutes of aerobic exercise without signs/symptoms of physical distress Progress to 45 minutes of aerobic exercise without signs/symptoms of physical distress Progress to 45 minutes of aerobic exercise without signs/symptoms of physical distress Progress to 45 minutes of aerobic exercise without signs/symptoms of physical distress Progress to 45 minutes of aerobic exercise without signs/symptoms of physical distress  Intensity _0      Progression   Progression Continue to progress workloads to maintain intensity without signs/symptoms of physical distress. Continue to progress workloads to maintain intensity without signs/symptoms of physical  distress. Continue to progress workloads to maintain intensity without signs/symptoms of physical distress. Continue to progress workloads to maintain intensity without signs/symptoms of physical distress. Continue to progress workloads to maintain intensity without signs/symptoms of physical distress.     Resistance Training   Training Prescription _1    Weight _2    Reps 10-15 10-15 10-15 10-15 10-15   Time 10 Minutes 10 Minutes 10 Minutes 10 Minutes 10 Minutes     Interval Training   Interval Training _3      Oxygen   Oxygen _4    Liters _5 Recumbant Bike   Level _6 Minutes _7 NuStep   Level 3  - _8 Minutes 17  - _9 METs 1.7  - 1.8 1.7 1.8     Track   Laps _10 - 11   Minutes _11 - 17   Row Name 09/08/16 1247 09/15/16 1226 09/20/16 1227 09/22/16 1200 09/29/16 1200     Response to Exercise   Blood Pressure (Admit) 140/70 152/60 136/64 132/60 154/66   Blood Pressure (Exercise) 160/70 140/70 136/70 144/80 158/60   Blood Pressure (Exit) 124/60 110/60 138/68 124/64 114/66   Heart Rate (Admit) 82 bpm 109 bpm 100 bpm 91 bpm 88 bpm   Heart Rate (Exercise) 137 bpm 116 bpm 127 bpm 124 bpm 123 bpm   Heart Rate (Exit) 96 bpm 86 bpm 97 bpm 88 bpm 96 bpm   Oxygen Saturation (Admit) 98 % 96 % 98 % 96 % 93 %   Oxygen Saturation (Exercise) 90 % 94 % 89 % 90 % 91 %   Oxygen Saturation (Exit) 98 % 97 % 95 % 98 % 97 %   Rating of Perceived Exertion (Exercise) _12 Perceived Dyspnea (Exercise) _13 Duration Progress to 45 minutes of aerobic exercise without signs/symptoms of physical distress Progress to 45 minutes of aerobic exercise without signs/symptoms of physical distress Progress to 45 minutes of aerobic exercise without signs/symptoms of physical distress Progress to 45 minutes of  aerobic exercise without signs/symptoms of physical distress Progress to 45 minutes of aerobic exercise without signs/symptoms of physical distress   Intensity _14      Progression   Progression Continue to progress workloads to maintain intensity without signs/symptoms of physical distress. Continue to progress workloads to maintain intensity without signs/symptoms of physical distress. Continue to progress workloads to maintain intensity without signs/symptoms of physical distress. Continue to progress workloads to maintain intensity without signs/symptoms of physical distress. Continue to progress workloads to maintain intensity without signs/symptoms of physical distress.     Resistance Training   Training Prescription _15    Weight _16    Reps 10-15 10-15 10-15 10-15 10-15   Time 10 Minutes 10 Minutes 10 Minutes 10 Minutes 10 Minutes  Interval Training   Interval Training _0      Oxygen   Oxygen _1    Liters _2 Recumbant Bike   Level _3 - 3   Minutes _4 - 17     NuStep   Level  - _5 -   Minutes  - _6 -   METs  - 2.1 1.8 1.9  -     Track   Laps 14  - _7 Minutes 17  - _8 Row Name 10/06/16 1302             Response to Exercise   Blood Pressure (Admit) 130/66       Blood Pressure (Exercise) 148/68       Blood Pressure (Exit) 130/63       Heart Rate (Admit) 93 bpm       Heart Rate (Exercise) 113 bpm       Heart Rate (Exit) 89 bpm       Oxygen Saturation (Admit) 98 %       Oxygen Saturation (Exercise) 92 %       Oxygen Saturation (Exit) 97 %       Rating of Perceived Exertion (Exercise) 11       Perceived Dyspnea (Exercise) 1       Duration Progress to 45 minutes of aerobic exercise without signs/symptoms of physical  distress       Intensity THRR unchanged         Progression   Progression Continue to progress workloads to maintain intensity without signs/symptoms of physical distress.         Resistance Training   Training Prescription Yes       Weight blue bands       Reps 10-15       Time 10 Minutes         Interval Training   Interval Training No         Oxygen   Oxygen Continuous       Liters 3         NuStep   Level 4       Minutes 17       METs 1.8         Track   Laps 16       Minutes 17          Exercise Comments:   Exercise Goals and Review:   Exercise Goals Re-Evaluation :     Exercise Goals Re-Evaluation    Row Name 08/15/16 1606 09/13/16 0716 09/13/16 0719         Exercise Goal Re-Evaluation   Exercise Goals Review Increase Strenth and Stamina;Increase Physical Activity Increase Physical Activity;Increase Strenth and Stamina  -     Comments Patient has only attended four exercise sessions. Has a hard time pushing herself. 1.7 MET average on Stepper. Will cont. to monitor and progress.  Patient is not making many gains in Pulmonary Rehab. Patient has been maintaining 11-14 laps (200 feet each) on track in 15 minutes since 07/28/16 (start of program). 1.7 METS on stepper  -     Expected Outcomes Through the exercise here at rehab the patient with increase physical capacity, strength, and stamina.  - Through the exercise here at rehab the patient with increase physical capacity, strength,  and stamina.        Discharge Exercise Prescription (Final Exercise Prescription Changes):     Exercise Prescription Changes - 10/06/16 1302      Response to Exercise   Blood Pressure (Admit) 130/66   Blood Pressure (Exercise) 148/68   Blood Pressure (Exit) 130/63   Heart Rate (Admit) 93 bpm   Heart Rate (Exercise) 113 bpm   Heart Rate (Exit) 89 bpm   Oxygen Saturation (Admit) 98 %   Oxygen Saturation (Exercise) 92 %   Oxygen Saturation (Exit) 97 %   Rating of Perceived  Exertion (Exercise) 11   Perceived Dyspnea (Exercise) 1   Duration Progress to 45 minutes of aerobic exercise without signs/symptoms of physical distress   Intensity THRR unchanged     Progression   Progression Continue to progress workloads to maintain intensity without signs/symptoms of physical distress.     Resistance Training   Training Prescription Yes   Weight blue bands   Reps 10-15   Time 10 Minutes     Interval Training   Interval Training No     Oxygen   Oxygen Continuous   Liters 3     NuStep   Level 4   Minutes 17   METs 1.8     Track   Laps 16   Minutes 17      Nutrition:  Target Goals: Understanding of nutrition guidelines, daily intake of sodium <1526m, cholesterol <2076m calories 30% from fat and 7% or less from saturated fats, daily to have 5 or more servings of fruits and vegetables.  Biometrics:     Pre Biometrics - 07/18/16 1339      Pre Biometrics   Grip Strength 33 kg       Nutrition Therapy Plan and Nutrition Goals:     Nutrition Therapy & Goals - 09/01/16 1323      Nutrition Therapy   Diet Therapeutic Lifestyle Changes     Personal Nutrition Goals   Nutrition Goal Maintain wt ~227 lb while in Pulmonary Rehab     Intervention Plan   Intervention Prescribe, educate and counsel regarding individualized specific dietary modifications aiming towards targeted core components such as weight, hypertension, lipid management, diabetes, heart failure and other comorbidities.;Nutrition handout(s) given to patient.  Low Sodium Nutrition Therapy   Expected Outcomes Short Term Goal: Understand basic principles of dietary content, such as calories, fat, sodium, cholesterol and nutrients.;Long Term Goal: Adherence to prescribed nutrition plan.      Nutrition Discharge: Rate Your Plate Scores:     Nutrition Assessments - 09/01/16 1212      Rate Your Plate Scores   Pre Score 51      Nutrition Goals Re-Evaluation:   Nutrition Goals  Discharge (Final Nutrition Goals Re-Evaluation):   Psychosocial: Target Goals: Acknowledge presence or absence of significant depression and/or stress, maximize coping skills, provide positive support system. Participant is able to verbalize types and ability to use techniques and skills needed for reducing stress and depression.  Initial Review & Psychosocial Screening:     Initial Psych Review & Screening - 07/18/16 1341      Initial Review   Current issues with --  none identified     Family Dynamics   Good Support System? Yes     Barriers   Psychosocial barriers to participate in program There are no identifiable barriers or psychosocial needs.     Screening Interventions   Interventions Encouraged to exercise      Quality of Life  Scores:   PHQ-9: Recent Review Flowsheet Data    Depression screen [facility] 2/9 07/18/2016 02/16/2015   Decreased Interest 0 0   Down, Depressed, Hopeless 0 0   PHQ - 2 Score 0 0     Interpretation of Total Score  Total Score Depression Severity:  1-4 = Minimal depression, 5-9 = Mild depression, 10-14 = Moderate depression, 15-19 = Moderately severe depression, 20-27 = Severe depression   Psychosocial Evaluation and Intervention:     Psychosocial Evaluation - 07/18/16 1342      Psychosocial Evaluation & Interventions   Interventions Encouraged to exercise with the program and follow exercise prescription   Continue Psychosocial Services  No      Psychosocial Re-Evaluation:     Psychosocial Re-Evaluation    [place] Name 08/15/16 1019 08/15/16 1213 09/12/16 1211 10/10/16 1659       Psychosocial Re-Evaluation   Current issues with None Identified  - None Identified None Identified    Expected Outcomes  - patient will remain free from psycosocial barriers  patient will remain free from psycosocial barriers  patient will remain free from psycosocial barriers     Interventions Encouraged to attend Pulmonary Rehabilitation for the exercise  -  Encouraged to attend Pulmonary Rehabilitation for the exercise Encouraged to attend Pulmonary Rehabilitation for the exercise    Continue Psychosocial Services  No Follow up required  - No Follow up required  -       Psychosocial Discharge (Final Psychosocial Re-Evaluation):     Psychosocial Re-Evaluation - 10/10/16 1659      Psychosocial Re-Evaluation   Current issues with None Identified   Expected Outcomes patient will remain free from psycosocial barriers    Interventions Encouraged to attend Pulmonary Rehabilitation for the exercise      Education: Education Goals: Education classes will be provided on a weekly basis, covering required topics. Participant will state understanding/return demonstration of topics presented.  Learning Barriers/Preferences:     Learning Barriers/Preferences - 07/18/16 1337      Learning Barriers/Preferences   Learning Barriers None   Learning Preferences Audio;Group Instruction;Individual Instruction;Pictoral;Skilled Demonstration;Verbal Instruction      Education Topics: Risk Factor Reduction:  -Group instruction that is supported by a PowerPoint presentation. Instructor discusses the definition of a risk factor, different risk factors for pulmonary disease, and how the heart and lungs work together.     PULMONARY REHAB OTHER RESPIRATORY from 10/06/2016 in [place]  Date  08/25/16  Educator  EP  Instruction Review Code  2- meets goals/outcomes      Nutrition for Pulmonary Patient:  -Group instruction provided by PowerPoint slides, verbal discussion, and written materials to support subject matter. The instructor gives an explanation and review of healthy diet recommendations, which includes a discussion on weight management, recommendations for fruit and vegetable consumption, as well as protein, fluid, caffeine, fiber, sodium, sugar, and alcohol. Tips for eating when patients are short of breath are  discussed.   PULMONARY REHAB OTHER RESPIRATORY from 10/06/2016 in [place]  Date  08/18/16  Educator  RD  Instruction Review Code  2- meets goals/outcomes      Pursed Lip Breathing:  -Group instruction that is supported by demonstration and informational handouts. Instructor discusses the benefits of pursed lip and diaphragmatic breathing and detailed demonstration on how to preform both.     Oxygen Safety:  -Group instruction provided by PowerPoint, verbal discussion, and written material to support subject matter. There  is an overview of "What is Oxygen" and "Why do we need it".  Instructor also reviews how to create a safe environment for oxygen use, the importance of using oxygen as prescribed, and the risks of noncompliance. There is a brief discussion on traveling with oxygen and resources the patient may utilize.   PULMONARY REHAB OTHER RESPIRATORY from 10/06/2016 in [place]  Date  09/08/16  Educator  Truddie Crumble  Instruction Review Code  2- meets goals/outcomes      Oxygen Equipment:  -Group instruction provided by [employer] Staff utilizing handouts, written materials, and equipment demonstrations.   Signs and Symptoms:  -Group instruction provided by written material and verbal discussion to support subject matter. Warning signs and symptoms of infection, stroke, and heart attack are reviewed and when to call the physician/911 reinforced. Tips for preventing the spread of infection discussed.   PULMONARY REHAB OTHER RESPIRATORY from 10/06/2016 in [place]  Date  10/06/16  Educator  rn  Instruction Review Code  2- meets goals/outcomes      Advanced Directives:  -Group instruction provided by verbal instruction and written material to support subject matter. Instructor reviews Advanced Directive laws and proper instruction for filling out document.   Pulmonary Video:  -Group  video education that reviews the importance of medication and oxygen compliance, exercise, good nutrition, pulmonary hygiene, and pursed lip and diaphragmatic breathing for the pulmonary patient.   PULMONARY REHAB OTHER RESPIRATORY from 10/06/2016 in [place]  Date  09/15/16  Instruction Review Code  R- Review/reinforce      Exercise for the Pulmonary Patient:  -Group instruction that is supported by a PowerPoint presentation. Instructor discusses benefits of exercise, core components of exercise, frequency, duration, and intensity of an exercise routine, importance of utilizing pulse oximetry during exercise, safety while exercising, and options of places to exercise outside of rehab.     PULMONARY REHAB OTHER RESPIRATORY from 10/06/2016 in [place]  Date  09/22/16  Educator  EP  Instruction Review Code  2- meets goals/outcomes      Pulmonary Medications:  -Verbally interactive group education provided by instructor with focus on inhaled medications and proper administration.   PULMONARY REHAB OTHER RESPIRATORY from 10/06/2016 in [place]  Date  09/01/16  Educator  pharm  Instruction Review Code  2- meets goals/outcomes      Anatomy and Physiology of the Respiratory System and Intimacy:  -Group instruction provided by PowerPoint, verbal discussion, and written material to support subject matter. Instructor reviews respiratory cycle and anatomical components of the respiratory system and their functions. Instructor also reviews differences in obstructive and restrictive respiratory diseases with examples of each. Intimacy, Sex, and Sexuality differences are reviewed with a discussion on how relationships can change when diagnosed with pulmonary disease. Common sexual concerns are reviewed.   PULMONARY REHAB OTHER RESPIRATORY from 10/06/2016 in [place]  Date   08/11/16  Educator  RN  Instruction Review Code  2- meets goals/outcomes      Knowledge Questionnaire Score:   Core Components/Risk Factors/Patient Goals at Admission:     Personal Goals and Risk Factors at Admission - 07/18/16 1339      Core Components/Risk Factors/Patient Goals on Admission   Increase Strength and Stamina Yes   Improve shortness of breath with ADL's Yes   Develop more efficient breathing techniques such as purse lipped  breathing and diaphragmatic breathing; and practicing self-pacing with activity Yes   Increase knowledge of respiratory medications and ability to use respiratory devices properly  Yes      Core Components/Risk Factors/Patient Goals Review:      Goals and Risk Factor Review    Row Name 07/18/16 1340 08/15/16 1009 09/12/16 1211 10/10/16 1658       Core Components/Risk Factors/Patient Goals Review   Personal Goals Review Increase Strength and Stamina;Improve shortness of breath with ADL's;Develop more efficient breathing techniques such as purse lipped breathing and diaphragmatic breathing and practicing self-pacing with activity.;Increase knowledge of respiratory medications and ability to use respiratory devices properly. Increase knowledge of respiratory medications and ability to use respiratory devices properly. Increase knowledge of respiratory medications and ability to use respiratory devices properly. Improve shortness of breath with ADL's;Develop more efficient breathing techniques such as purse lipped breathing and diaphragmatic breathing and practicing self-pacing with activity.    Review  - see comments section in ITP see comments section in ITP see comments section in ITP    Expected Outcomes  - see expected outcomes on Admission ITP see expected outcomes on Admission ITP see expected outcomes on Admission ITP       Core Components/Risk Factors/Patient Goals at Discharge (Final Review):      Goals and Risk Factor Review - 10/10/16  1658      Core Components/Risk Factors/Patient Goals Review   Personal Goals Review Improve shortness of breath with ADL's;Develop more efficient breathing techniques such as purse lipped breathing and diaphragmatic breathing and practicing self-pacing with activity.   Review see comments section in ITP   Expected Outcomes see expected outcomes on Admission ITP      ITP Comments:   Comments: ITP REVIEW Pt is making slow progress toward pulmonary rehab goals after completing 16 sessions. She continues to need reminding to purse lip breath. She does do a good job with pacing however this may have prevented her from increasing her mets. If it is determined that she is at her maximum exercise capacity, she will graduate within the next 30 day ITP cycle. She  Recommend continued exercise, life style modification, education, and utilization of breathing techniques to increase stamina and strength and decrease shortness of breath with exertion.

## 2016-10-10 NOTE — Patient Instructions (Addendum)
Augmentin 85m Twice daily -take with food  Mucinex DM Twice daily  As needed  Cough/congestion  Prednisone taper over next week.  Continue on oxygen 3l/m .  Chest xray today .  Follow up with Dr. MLake Bellsin 2 weeks as planned and As needed   Please contact office for sooner follow up if symptoms do not improve or worsen or seek emergency care

## 2016-10-10 NOTE — Assessment & Plan Note (Signed)
Progressive changes on CT chest and decline in PFT /DLCO .  ? Flare today with bronchitis  Check CXR to make sure no superimposed PNA   Plan  Patient Instructions  Augmentin 892m Twice daily -take with food  Mucinex DM Twice daily  As needed  Cough/congestion  Prednisone taper over next week.  Continue on oxygen 3l/m .  Chest xray today .  Follow up with Dr. MLake Bellsin 2 weeks as planned and As needed   Please contact office for sooner follow up if symptoms do not improve or worsen or seek emergency care

## 2016-10-10 NOTE — Progress Notes (Signed)
_0  ID: [name], female    DOB: 01-17-1941, 76 y.o.   MRN: [id number]  Chief Complaint  Patient presents with  . Acute Visit    Referring provider: Lawerance Cruel, MD  HPI: 76 year old female never smoker followed for interstitial lung disease, UIP dx w/ lung bx. .   TEST Joya San  06/2016 CT angiogram> no PE, superimposed ground glass changes in addition to baseline pulmonary fibrosis 07/2016 6 MW> 1320 feet 3 L O2 88% CT chest 2008:  Mild bronchiectasis, ?NSIP: CXR 05/2014:  Progressive IS changes compared to 2012. 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  HRCT 2016:  Changes c/w fibrotic NSIP?  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 The patient's lowest oxygen saturation was 90 %, highest heart rate was 118 bpm , and highest blood pressure was 180/80 March 2017 pulmonary function testing ratio 88%, FVC 2.03 L (84% predicted), total lung capacity 3.0 L (54% predicted), DLCO 14.7 (51% predicted)   10/10/2016 Acute OV : Cough  Pt presents for an acute office visit. Complains of 2 weeks of cough , congestion , yellow mucus and wheezing .   Using Albuterol nebs at home Remains on O2 at 3l/m . O2 sats has been good >90% at home.  Good appetite w/ no n/vd. No fever or hemoptysis  Chest pain or increased edema.    Patient had a CT chest 09/21/2016 showed pulmonary parenchymal pattern of fibrosis with progression since 2008. PFT did show decline in  DLCO .     Allergies  Allergen Reactions  . Adhesive [Tape] Itching    Rash  . Sulfonamide Derivatives Other (See Comments)    Unknown    Immunization History  Administered Date(s) Administered  . Influenza, High Dose Seasonal PF 04/20/2016  . Influenza-Unspecified 03/06/2014, 02/18/2015  . Pneumococcal Conjugate-13 03/06/2014    Past Medical History:  Diagnosis Date  . Allergic rhinitis    takes Claritin nightly  . Anxiety    but not on any meds  . Arthritis   . Asthma   . Carpal tunnel syndrome   . GERD (gastroesophageal reflux disease)   . History of bronchitis 3-52yr ago  . History of colon polyps    benign  . Joint pain   . Peripheral edema    takes Lasix daily  . Shortness of breath dyspnea    with exertion;Albuterol inhaler as needed  . Thyroid nodule     Tobacco History: History  Smoking Status  . Never Smoker  Smokeless Tobacco  . Never Used   Counseling given: Not Answered   Outpatient Encounter Prescriptions as of 10/10/2016  Medication Sig  . albuterol (PROVENTIL HFA;VENTOLIN HFA) 108 (90 BASE) MCG/ACT inhaler Inhale 2 puffs into the lungs every 6 (six) hours as needed for wheezing or shortness of breath.   .Marland Kitchenalbuterol (PROVENTIL) (2.5 MG/3ML) 0.083% nebulizer solution Take 3 mLs (2.5 mg total) by nebulization every 6 (six) hours as needed for wheezing or shortness of  breath.  Marland Kitchen aspirin 325 MG tablet Take 650 mg by mouth every 6 (six) hours as needed for mild pain or moderate pain.   . fluticasone (FLONASE) 50 MCG/ACT nasal spray Place 1 spray into both nostrils daily.  . furosemide (LASIX) 20 MG tablet Take 20 mg by mouth as needed for fluid.   . hydrOXYzine (ATARAX/VISTARIL) 10 MG tablet Take 1 tablet (10 mg total) by mouth 3 (three) times daily as needed for anxiety.  Marland Kitchen ibuprofen (ADVIL,MOTRIN) 200 MG tablet Take 400 mg by mouth every 6 (six) hours as needed for mild pain or moderate pain.   Marland Kitchen loratadine (CLARITIN) 5 MG chewable tablet Chew 5 mg  by mouth daily as needed for allergies.  Marland Kitchen amoxicillin-clavulanate (AUGMENTIN) 875-125 MG tablet Take 1 tablet by mouth 2 (two) times daily.  . predniSONE (DELTASONE) 10 MG tablet 4 tabs for 2 days, then 3 tabs for 2 days, 2 tabs for 2 days, then 1 tab for 2 days, then stop   No facility-administered encounter medications on file as of 10/10/2016.      Review of Systems  Constitutional:   No  weight loss, night sweats,  Fevers, chills,  +fatigue, or  lassitude.  HEENT:   No headaches,  Difficulty swallowing,  Tooth/dental problems, or  Sore throat,                No sneezing, itching, ear ache,  +nasal congestion, post nasal drip,   CV:  No chest pain,  Orthopnea, PND, swelling in lower extremities, anasarca, dizziness, palpitations, syncope.   GI  No heartburn, indigestion, abdominal pain, nausea, vomiting, diarrhea, change in bowel habits, loss of appetite, bloody stools.   Resp:    No chest wall deformity  Skin: no rash or lesions.  GU: no dysuria, change in color of urine, no urgency or frequency.  No flank pain, no hematuria   MS:  No joint pain or swelling.  No decreased range of motion.  No back pain.    Physical Exam  BP 132/76 (BP Location: Left Arm, Cuff Size: Normal)   Pulse 86   Ht _0  (1.676 m)   Wt 226 lb 9.6 oz (102.8 kg)   SpO2 97%   BMI 36.57 kg/m   GEN: A/Ox3; pleasant , NAD,elderly on O2 , harsh cough    HEENT:  [place]/AT,  EACs-clear, TMs-wnl, NOSE-clear, THROAT-clear, no lesions, no postnasal drip or exudate noted.   NECK:  Supple w/ fair ROM; no JVD; normal carotid impulses w/o bruits; no thyromegaly or nodules palpated; no lymphadenopathy.    RESP  BB crackles , few upper airway psuedowheezes  no accessory muscle use, no dullness to percussion  CARD:  RRR, no m/r/g, no peripheral edema, pulses intact, no cyanosis or clubbing.  GI:   Soft & nt; nml bowel sounds; no organomegaly or masses detected.   Musco: Warm bil, no deformities or joint  swelling noted.   Neuro: alert, no focal deficits noted.    Skin: Warm, no lesions or rashes   Lab Results:  CBC   Imaging: Ct Chest High Resolution  Result Date: 09/21/2016 CLINICAL DATA:  Chronic cough and shortness of breath. EXAM: CT CHEST WITHOUT CONTRAST TECHNIQUE: Multidetector CT imaging of the chest was performed following the standard protocol without intravenous contrast. High resolution imaging of the lungs, as well as inspiratory and expiratory imaging, was performed. COMPARISON:  06/17/2016, 06/24/2014 and 03/21/2007. FINDINGS: Cardiovascular: Atherosclerotic calcification of the arterial vasculature, including coronary arteries. Heart  is enlarged. No pericardial effusion. Mediastinum/Nodes: Right thyroid is enlarged and heterogeneous. Multiple borderline enlarged mediastinal lymph nodes measure up to 10 mm in the low right paratracheal station, as before. Hilar regions are difficult to evaluate without IV contrast. No axillary adenopathy. Esophagus is grossly unremarkable. There may be a tiny hiatal hernia. Lungs/Pleura: There is interstitial coarsening and subpleural reticulation with a basilar predominance. Associated traction bronchiectasis/ bronchiolectasis and scattered ground-glass. Findings have progressed from baseline examination of 03/21/2007. Postoperative changes in the peripheral right upper lobe. There is air trapping. No pleural fluid. Airway is unremarkable. Upper Abdomen: Visualized portions of the liver, adrenal glands, kidneys, spleen, pancreas, stomach and bowel are grossly unremarkable with exception of a tiny hiatal hernia. No upper abdominal adenopathy. Musculoskeletal: No worrisome lytic or sclerotic lesions. Degenerative changes are seen in the spine and right sternoclavicular joint. IMPRESSION: 1. Pulmonary parenchymal pattern of fibrosis shows progression from baseline examination of 03/21/2007. Pattern of fibrosis and progression are indicative of usual  interstitial pneumonitis. 2. Aortic atherosclerosis (ICD10-170.0). Coronary artery calcification. Electronically Signed   By: Lorin Picket M.D.   On: 09/21/2016 16:14     Assessment & Plan:   Interstitial lung disease ([place]) Progressive changes on CT chest and decline in PFT /DLCO .  ? Flare today with bronchitis  Check CXR to make sure no superimposed PNA   Plan  Patient Instructions  Augmentin 89m Twice daily -take with food  Mucinex DM Twice daily  As needed  Cough/congestion  Prednisone taper over next week.  Continue on oxygen 3l/m .  Chest xray today .  Follow up with Dr. MLake Bellsin 2 weeks as planned and As needed   Please contact office for sooner follow up if symptoms do not improve or worsen or seek emergency care         TRexene Edison NP 10/10/2016

## 2016-10-10 NOTE — Progress Notes (Signed)
Called spoke with patient, advised of cxr results / recs as stated by TP.  Pt verbalized her understanding and denied any questions. 

## 2016-10-11 ENCOUNTER — Telehealth (HOSPITAL_COMMUNITY): Payer: Self-pay | Admitting: Family Medicine

## 2016-10-11 ENCOUNTER — Encounter (HOSPITAL_COMMUNITY): Payer: Medicare Other

## 2016-10-12 NOTE — Progress Notes (Signed)
Reviewed, agree 

## 2016-10-13 ENCOUNTER — Encounter (HOSPITAL_COMMUNITY)
Admission: RE | Admit: 2016-10-13 | Discharge: 2016-10-13 | Disposition: A | Discharge status: Home / Self Care | Payer: Medicare Other | Source: Ambulatory Visit

## 2016-10-13 DIAGNOSIS — J8410 Pulmonary fibrosis, unspecified: Secondary | ICD-10-CM

## 2016-10-17 ENCOUNTER — Telehealth: Payer: Self-pay | Admitting: Adult Health

## 2016-10-17 MED ORDER — AMOXICILLIN-POT CLAVULANATE 875-125 MG PO TABS: 1.0000 | ORAL_TABLET | Freq: Two times a day (BID) | ORAL | 0 refills | Status: DC

## 2016-10-17 NOTE — Telephone Encounter (Signed)
Rx has been sent to preferred pharmacy.  Pt is aware and voiced her understanding. Nothing further needed.

## 2016-10-17 NOTE — Telephone Encounter (Signed)
Can extend Augmentin 883m # 6  1 Twice daily  For 3 additional days  If not resolving will need sooner ov  Please contact office for sooner follow up if symptoms do not improve or worsen or seek emergency care

## 2016-10-17 NOTE — Telephone Encounter (Signed)
Plan  Patient Instructions  Augmentin 827m Twice daily -take with food  Mucinex DM Twice daily  As needed  Cough/congestion  Prednisone taper over next week.  Continue on oxygen 3l/m .  Chest xray today .  Follow up with Dr. MLake Bellsin 2 weeks as planned and As needed   Please contact office for sooner follow up if symptoms do not improve or worsen or seek emergency care    Pt states she continue to cough-yellow phlegm, head and chest congestion as well. Pt feels like another round of Augmentin would help clear this up. Pharmacy: [facility] please advise. Thanks.

## 2016-10-18 ENCOUNTER — Encounter (HOSPITAL_COMMUNITY): Payer: Medicare Other

## 2016-10-20 ENCOUNTER — Ambulatory Visit (HOSPITAL_COMMUNITY): Payer: Self-pay

## 2016-10-25 ENCOUNTER — Telehealth (HOSPITAL_COMMUNITY): Payer: Self-pay | Admitting: Family Medicine

## 2016-10-25 ENCOUNTER — Encounter: Payer: Self-pay | Admitting: Pulmonary Disease

## 2016-10-25 ENCOUNTER — Ambulatory Visit (INDEPENDENT_AMBULATORY_CARE_PROVIDER_SITE_OTHER): Payer: Medicare Other | Admitting: Pulmonary Disease

## 2016-10-25 ENCOUNTER — Encounter (HOSPITAL_COMMUNITY): Payer: Medicare Other

## 2016-10-25 VITALS — BP 146/70 | HR 87 | Ht 66.0 in | Wt 221.4 lb

## 2016-10-25 DIAGNOSIS — J9621 Acute and chronic respiratory failure with hypoxia: Secondary | ICD-10-CM

## 2016-10-25 DIAGNOSIS — J8410 Pulmonary fibrosis, unspecified: Secondary | ICD-10-CM

## 2016-10-25 DIAGNOSIS — J849 Interstitial pulmonary disease, unspecified: Secondary | ICD-10-CM | POA: Diagnosis not present

## 2016-10-25 MED ORDER — PREDNISONE 10 MG PO TABS: ORAL_TABLET | ORAL | 0 refills | Status: DC

## 2016-10-25 MED ORDER — BENZONATATE 100 MG PO CAPS: 100.0000 mg | ORAL_CAPSULE | Freq: Three times a day (TID) | ORAL | 1 refills | Status: DC | PRN

## 2016-10-25 NOTE — Progress Notes (Signed)
Subjective:    Patient ID: [name], female    DOB: 06/06/1941, 76 y.o.   MRN: [id number]  Synopsis: Former patient of Dr. 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 She was seen at [facility] for a second opinion and they felt that she had hypersensitivity pneumonitis, chronic form.   HPI Chief Complaint  Patient presents with  . Follow-up    sob unchanged, cough is prod with mostly clear/sometimes yellow mucus.     Eli has been sick lately.  She needed to see Tammy earlier and was treated with prednisone and Augmentin.  She says that in general she is feeling better.  She says that she has not had any sick contacts.  She continues to cough and bring up mucus.  She is using 3L O2.  She is taking albuterol regularly at home via a nebulizer.  This helps some.  She has a lot of post nasal drip.  No GERD symptoms.   Past Medical History:  Diagnosis Date  . Allergic rhinitis    takes Claritin nightly  . Anxiety    but not on any meds  . Arthritis   . Asthma   . Carpal tunnel syndrome   . GERD (gastroesophageal reflux disease)   . History of bronchitis 3-45yr ago  . History of colon polyps    benign  . Joint pain   . Peripheral edema    takes Lasix daily  . Shortness of breath dyspnea    with exertion;Albuterol inhaler as needed  . Thyroid nodule       Review of Systems  Constitutional: Negative for chills, fatigue and fever.  HENT: Negative for postnasal drip, rhinorrhea and sinus pressure.   Respiratory: Negative for cough, shortness of breath and wheezing.   Cardiovascular: Negative for chest pain, palpitations and leg swelling.       Objective:   Physical Exam Vitals:   10/25/16 1203  BP: (!) 146/70  Pulse: 87  SpO2: 98%  Weight: 221 lb 6.4 oz (100.4 kg)  Height: _0  (1.676 m)   3L pulse flow  Gen: chronically ill appearing HENT: OP clear, TM's  clear, neck supple PULM: Crackles bases bilaterally B, normal percussion CV: RRR, no mgr, trace edema GI: BS+, soft, nontender Derm: no cyanosis or rash Psyche: normal mood and affect    CBC    Component Value Date/Time   WBC 10.9 (H) 06/18/2016 0507   RBC 4.26 06/18/2016 0507   HGB 12.5 06/18/2016 0507   HCT 37.9 06/18/2016 0507   PLT 433 (H) 06/18/2016 0507   MCV 89.0 06/18/2016 0507   MCH 29.3 06/18/2016 0507   MCHC 33.0 06/18/2016 0507   RDW 15.3 06/18/2016 0507   MONOABS 0.8 (H) 02/06/2007 1043   EOSABS 0.4 02/06/2007 1043   BASOSABS 0.1 02/06/2007 1043   Imaging: 06/2016 CT angiogram> no PE, superimposed ground glass changes in addition to baseline pulmonary fibrosis  Pulmonary function test: PFT's 2009:  FEV1 1.97 (84%),ratio 70, 22% increase FEV1 with BD, TLC 4.44 (82%), DLCO 16.7 (62%) 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  PFT's January 2016:  FVC 2.13 (66%), no obstruction, TLC 4.00 (72%), DLCO 13.70 (48%) April 2018 ratio 89%, FVC 1.91 L 61% predicted, total lung capacity 3.49 L 63% predicted, DLCO 12.69 44% predicted  6MW 02/2015 6 Min  Walk: 1218 feet. The patient's lowest oxygen saturation was 90 %, highest heart rate was 118 bpm , and highest blood pressure was 180/80 07/2016 6 MW> 1320 feet 3 L O2 88%  Chest imaging: CT chest 2008:  Mild bronchiectasis, ?NSIP: CXR 05/2014:  Progressive IS changes compared to 2012.  HRCT 2016:  Changes c/w fibrotic NSIP?  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 Both of these show progression of her chronic interstitial lung disease. Findings on her CT chest are not consistent with idiopathic pulmonary fibrosis or UIP, and considering all the air-trapping seen in the fact that she had granulomas on her pathology further convinced that she has chronic hypersensitivity pneumonitis. Also supporting this diagnosis is the fact that her lung function decline has been quite slow over the last 9 years.  All that being said, her chronic lung disease is certainly caught up with her and she is now quite more symptomatic with shortness of breath, cough and chronic respiratory failure with hypoxemia.  Lengthy discussion today I explained that there is not a good treatment for her condition and through history taking and lab work we've never been able to identify a cause for her chronic hypersensitivity pneumonitis. She is not interested in leaving her house which is reasonable considering she just moved there 3 years ago and she had this disease prior to that.  I explained to them today but there is nothing I can do that's going to make this condition better. We talked about the pros and cons of taking chronic prednisone. I explained that no clear evidence that would suggest that this is effective but some people have responded in the past.  Plan: After lengthy discussion we decided to try a trial of low-dose prednisone over the next 6 weeks, I have prescribed 20 mg daily for 10 days then 10 mg daily after that Follow-up with me in 6 weeks Proceed with cataract surgery as soon as possible, take prednisone after that Treat concomitant conditions which contribute to her shortness of breath (allergic rhinitis, GERD) Continue pulmonary rehabilitation for exercise Continue to use oxygen 3 L continuously Follow-up in 6-8 weeks or sooner if needed  Greater than 50% of this 50 minute visit spent face-to-face  Acute on chronic  respiratory failure with hypoxia ([place]) Rx given for Portable oxygen concentrator (they want to buy from [employer])    Current Outpatient Prescriptions:  .  albuterol (PROVENTIL HFA;VENTOLIN HFA) 108 (90 BASE) MCG/ACT inhaler, Inhale 2 puffs into the lungs every 6 (six) hours as needed for wheezing or shortness of breath. , Disp: , Rfl:  .  albuterol (PROVENTIL) (2.5 MG/3ML) 0.083% nebulizer solution, Take 3 mLs (2.5 mg total) by nebulization every 6 (six) hours as needed for wheezing or shortness of breath., Disp: 360 mL, Rfl: 5 .  aspirin 325 MG tablet, Take 650 mg by mouth every 6 (six) hours as needed for mild pain or moderate pain. , Disp: , Rfl:  .  fluticasone (FLONASE) 50 MCG/ACT nasal spray, Place 1 spray into both nostrils daily., Disp: , Rfl:  .  furosemide (  LASIX) 20 MG tablet, Take 20 mg by mouth as needed for fluid. , Disp: , Rfl:  .  ibuprofen (ADVIL,MOTRIN) 200 MG tablet, Take 400 mg by mouth every 6 (six) hours as needed for mild pain or moderate pain. , Disp: , Rfl:  .  loratadine (CLARITIN) 5 MG chewable tablet, Chew 5 mg by mouth daily as needed for allergies., Disp: , Rfl:

## 2016-10-25 NOTE — Patient Instructions (Signed)
Take the prednisone 20 mg daily 10 days then 10 mg daily for 5 weeks Keep using oxygen as you are doing Have your cataract surgery as soon as possible Follow-up with me in 6-8 weeks or sooner if needed

## 2016-10-25 NOTE — Assessment & Plan Note (Signed)
I have independently reviewed the images from the recent high-resolution CT chest interviewed the pulmonary function testing. Both of these show progression of her chronic interstitial lung disease. Findings on her CT chest are not consistent with idiopathic pulmonary fibrosis or UIP, and considering all the air-trapping seen in the fact that she had granulomas on her pathology further convinced that she has chronic hypersensitivity pneumonitis. Also supporting this diagnosis is the fact that her lung function decline has been quite slow over the last 9 years.  All that being said, her chronic lung disease is certainly caught up with her and she is now quite more symptomatic with shortness of breath, cough and chronic respiratory failure with hypoxemia.  Lengthy discussion today I explained that there is not a good treatment for her condition and through history taking and lab work we've never been able to identify a cause for her chronic hypersensitivity pneumonitis. She is not interested in leaving her house which is reasonable considering she just moved there 3 years ago and she had this disease prior to that.  I explained to them today but there is nothing I can do that's going to make this condition better. We talked about the pros and cons of taking chronic prednisone. I explained that no clear evidence that would suggest that this is effective but some people have responded in the past.  Plan: After lengthy discussion we decided to try a trial of low-dose prednisone over the next 6 weeks, I have prescribed 20 mg daily for 10 days then 10 mg daily after that Follow-up with me in 6 weeks Proceed with cataract surgery as soon as possible, take prednisone after that Treat concomitant conditions which contribute to her shortness of breath (allergic rhinitis, GERD) Continue pulmonary rehabilitation for exercise Continue to use oxygen 3 L continuously Follow-up in 6-8 weeks or sooner if  needed  Greater than 50% of this 50 minute visit spent face-to-face

## 2016-10-25 NOTE — Assessment & Plan Note (Signed)
Rx given for Portable oxygen concentrator (they want to buy from [employer])

## 2016-10-27 ENCOUNTER — Encounter (HOSPITAL_COMMUNITY)
Admission: RE | Admit: 2016-10-27 | Discharge: 2016-10-27 | Disposition: A | Discharge status: Home / Self Care | Payer: Medicare Other | Source: Ambulatory Visit | Attending: Pulmonary Disease | Admitting: Pulmonary Disease

## 2016-10-27 VITALS — Wt 225.5 lb

## 2016-10-27 DIAGNOSIS — J8410 Pulmonary fibrosis, unspecified: Secondary | ICD-10-CM

## 2016-10-27 DIAGNOSIS — J679 Hypersensitivity pneumonitis due to unspecified organic dust: Secondary | ICD-10-CM | POA: Diagnosis not present

## 2016-10-27 NOTE — Progress Notes (Signed)
Daily Session Note  Patient Details  Name: [name] MRN: [id number] Date of Birth: [birth year] Referring Provider:     Pulmonary Rehab Walk Test from 07/21/2016 in [place]  Referring Provider  Dr. Lake Bells      Encounter Date: 10/27/2016  Check In:     Session Check In - 10/27/16 1030      Check-In   Location [facility]   Staff Present Trish Fountain, RN, BSN;Ramon Dredge, RN, MHA;Riyanna Crutchley Leonia Reeves, RN, BSN   Supervising physician immediately available to respond to emergencies [facility] immediately available   Physician(s) Dr. Karleen Hampshire   Medication changes reported     No   Fall or balance concerns reported    No   Tobacco Cessation No Change   Warm-up and Cool-down Performed as group-led instruction   Resistance Training Performed Yes   VAD Patient? No     Pain Assessment   Currently in Pain? No/denies   Multiple Pain Sites No      Capillary Blood Glucose: No results found for this or any previous visit (from the past 24 hour(s)).      Exercise Prescription Changes - 10/27/16 1300      Response to Exercise   Blood Pressure (Admit) 160/70   Blood Pressure (Exercise) 166/80   Blood Pressure (Exit) 130/72   Heart Rate (Admit) 94 bpm   Heart Rate (Exercise) 108 bpm   Heart Rate (Exit) 93 bpm   Oxygen Saturation (Admit) 96 %   Oxygen Saturation (Exercise) 94 %   Oxygen Saturation (Exit) 96 %   Rating of Perceived Exertion (Exercise) 13   Perceived Dyspnea (Exercise) 1   Duration Progress to 45 minutes of aerobic exercise without signs/symptoms of physical distress   Intensity THRR unchanged     Progression   Progression Continue to progress workloads to maintain intensity without signs/symptoms of physical distress.     Resistance Training   Training Prescription Yes   Weight blue bands   Reps 10-15   Time 10 Minutes     Interval Training   Interval Training No     NuStep   Level 4   Minutes 17   METs 1.9     Track   Laps 14   Minutes 17      History  Smoking Status  . Never Smoker  Smokeless Tobacco  . Never Used    Goals Met:  Exercise tolerated well Strength training completed today  Goals Unmet:  Not Applicable  Comments: Service time is from 1030 to 1215    Dr. Rush Farmer is Medical Director for Pulmonary Rehab at [facility].

## 2016-11-01 ENCOUNTER — Encounter (HOSPITAL_COMMUNITY)
Admission: RE | Admit: 2016-11-01 | Discharge: 2016-11-01 | Disposition: A | Discharge status: Home / Self Care | Payer: Medicare Other | Source: Ambulatory Visit | Attending: Pulmonary Disease | Admitting: Pulmonary Disease

## 2016-11-01 VITALS — Wt 225.1 lb

## 2016-11-01 DIAGNOSIS — J8410 Pulmonary fibrosis, unspecified: Secondary | ICD-10-CM | POA: Diagnosis not present

## 2016-11-01 DIAGNOSIS — J679 Hypersensitivity pneumonitis due to unspecified organic dust: Secondary | ICD-10-CM | POA: Diagnosis not present

## 2016-11-01 NOTE — Progress Notes (Signed)
Daily Session Note  Patient Details  Name: [name] MRN: [id number] Date of Birth: 18-Jan-1941 Referring Provider:     Pulmonary Rehab Walk Test from 07/21/2016 in [place]  Referring Provider  Dr. Lake Bells      Encounter Date: 11/01/2016  Check In:     Session Check In - 11/01/16 1025      Check-In   Location [facility]   Staff Present Trish Fountain, RN, Maxcine Ham, RN, BSN;Molly diVincenzo, MS, ACSM RCEP, Exercise Physiologist   Supervising physician immediately available to respond to emergencies [facility] immediately available   Physician(s) Dr. Karleen Hampshire   Medication changes reported     No   Fall or balance concerns reported    No   Tobacco Cessation No Change   Warm-up and Cool-down Performed as group-led instruction   Resistance Training Performed Yes   VAD Patient? No     Pain Assessment   Currently in Pain? No/denies   Multiple Pain Sites No      Capillary Blood Glucose: No results found for this or any previous visit (from the past 24 hour(s)).      Exercise Prescription Changes - 11/01/16 1222      Response to Exercise   Blood Pressure (Admit) 140/66   Blood Pressure (Exercise) 160/68   Blood Pressure (Exit) 108/72   Heart Rate (Admit) 105 bpm   Heart Rate (Exercise) 134 bpm   Heart Rate (Exit) 86 bpm   Oxygen Saturation (Admit) 96 %   Oxygen Saturation (Exercise) 86 %   Oxygen Saturation (Exit) 98 %   Rating of Perceived Exertion (Exercise) 12   Perceived Dyspnea (Exercise) 2   Duration Progress to 45 minutes of aerobic exercise without signs/symptoms of physical distress   Intensity THRR unchanged     Progression   Progression Continue to progress workloads to maintain intensity without signs/symptoms of physical distress.     Resistance Training   Training Prescription Yes   Weight blue bands   Reps 10-15   Time 10 Minutes     Interval Training   Interval Training No      Oxygen   Oxygen Continuous  texted pt on pulse. desaturation noted on 5 liters   Liters 3     Recumbant Bike   Level 3   Minutes 17     NuStep   Level 4   Minutes 17   METs 1.7     Track   Laps 9   Minutes 17      History  Smoking Status  . Never Smoker  Smokeless Tobacco  . Never Used    Goals Met:  Exercise tolerated well Queuing for purse lip breathing No report of cardiac concerns or symptoms Strength training completed today  Goals Unmet:  Not Applicable  Comments: Service time is from 1030 to 1205   Dr. Rush Farmer is Medical Director for Pulmonary Rehab at [facility].

## 2016-11-03 ENCOUNTER — Encounter (HOSPITAL_COMMUNITY)
Admission: RE | Admit: 2016-11-03 | Discharge: 2016-11-03 | Disposition: A | Discharge status: Home / Self Care | Payer: Medicare Other | Source: Ambulatory Visit | Attending: Pulmonary Disease | Admitting: Pulmonary Disease

## 2016-11-03 DIAGNOSIS — J8410 Pulmonary fibrosis, unspecified: Secondary | ICD-10-CM

## 2016-11-08 ENCOUNTER — Encounter (HOSPITAL_COMMUNITY)
Admission: RE | Admit: 2016-11-08 | Discharge: 2016-11-08 | Disposition: A | Discharge status: Home / Self Care | Payer: Medicare Other | Source: Ambulatory Visit | Attending: Pulmonary Disease | Admitting: Pulmonary Disease

## 2016-11-08 DIAGNOSIS — J679 Hypersensitivity pneumonitis due to unspecified organic dust: Secondary | ICD-10-CM | POA: Insufficient documentation

## 2016-11-08 DIAGNOSIS — J8410 Pulmonary fibrosis, unspecified: Secondary | ICD-10-CM | POA: Insufficient documentation

## 2016-11-08 NOTE — Progress Notes (Signed)
Pulmonary Individual Treatment Plan  Patient Details  Name: [name] MRN: [id number] Date of Birth: [birth year] Referring Provider:     Pulmonary Rehab Walk Test from 07/21/2016 in [place]  Referring Provider  Dr. Lake Bells      Initial Encounter Date:    Pulmonary Rehab Walk Test from 07/21/2016 in [place]  Date  07/14/16  Referring Provider  Dr. 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  albuterol (PROVENTIL HFA;VENTOLIN HFA) 108 (90 BASE) MCG/ACT inhaler, Inhale 2 puffs into the lungs every 6 (six) hours as needed for wheezing or shortness of breath. , Disp: , Rfl:  .  albuterol (PROVENTIL) (2.5 MG/3ML) 0.083% nebulizer solution, Take 3 mLs (2.5 mg total) by nebulization every 6 (six) hours as needed for wheezing or shortness of breath., Disp: 360 mL, Rfl: 5 .  aspirin 325 MG tablet, Take 650 mg by mouth every 6 (six) hours as needed for mild pain or moderate pain. , Disp: , Rfl:  .  benzonatate (TESSALON) 100 MG capsule, Take 1 capsule (100 mg total) by mouth every 8 (eight) hours as needed for cough., Disp: 45 capsule, Rfl: 1 .  fluticasone (FLONASE) 50 MCG/ACT nasal spray, Place 1 spray into both nostrils daily., Disp: , Rfl:  .  furosemide (LASIX) 20 MG tablet, Take 20 mg by mouth as needed for fluid. , Disp: , Rfl:  .  ibuprofen (ADVIL,MOTRIN) 200 MG tablet, Take 400 mg by mouth every 6 (six) hours as needed for mild pain or moderate pain. , Disp: , Rfl:  .  loratadine (CLARITIN) 5 MG chewable tablet, Chew 5 mg by mouth daily as needed for allergies., Disp: , Rfl:  .  predniSONE (DELTASONE) 10 MG tablet, Take 31m QD x 5days, then 10 mg QD x 5 weeks, Disp: 45 tablet, Rfl: 0  Past Medical History: Past Medical History:  Diagnosis Date  . Allergic rhinitis    takes Claritin nightly  .  Anxiety    but not on any meds  . Arthritis   . Asthma   . Carpal tunnel syndrome   . GERD (gastroesophageal reflux disease)   . History of bronchitis 3-464yrago  . History of colon polyps    benign  . Joint pain   . Peripheral edema    takes Lasix daily  . Shortness of breath dyspnea    with exertion;Albuterol inhaler as needed  . Thyroid nodule     Tobacco Use: History  Smoking Status  . Never Smoker  Smokeless Tobacco  . Never Used    Labs: Recent Review Flowsheet Data    Labs for ITP Cardiac and Pulmonary Rehab Latest Ref Rng & Units 11/18/2014 11/21/2014   PHART 7.350 - 7.450 7.426 7.356   PCO2ART 35.0 - 45.0 mmHg 40.7 49.1(H)   HCO3 20.0 - 24.0 mEq/L 26.3(H) 26.8(H)   TCO2 0 - 100 mmol/L 27.5 28.4   O2SAT % 96.8 96.2      Capillary Blood Glucose: No results found for: GLUCAP   ADL UCSD:   Pulmonary Function Assessment:     Pulmonary Function Assessment - 07/18/16 1337      Breath   Bilateral Breath Sounds Wheezes;Clear   Shortness of Breath Limiting activity;Yes      Exercise Target Goals:    Exercise Program Goal: Individual exercise prescription set with THRR,  safety & activity barriers. Participant demonstrates ability to understand and report RPE using BORG scale, to self-measure pulse accurately, and to acknowledge the importance of the exercise prescription.  Exercise Prescription Goal: Starting with aerobic activity 30 plus minutes a day, 3 days per week for initial exercise prescription. Provide home exercise prescription and guidelines that participant acknowledges understanding prior to discharge.  Activity Barriers & Risk Stratification:     Activity Barriers & Cardiac Risk Stratification - 07/18/16 1335      Activity Barriers & Cardiac Risk Stratification   Activity Barriers Arthritis;Left Knee Replacement;Shortness of Breath;Deconditioning;Muscular Weakness      6 Minute Walk:     6 Minute Walk    Row Name 07/21/16 1622          6 Minute Walk   Phase Initial     Distance 1320 feet     Walk Time 6 minutes     # of Rest Breaks 0     MPH 2.5     METS 2.91     RPE 13     Perceived Dyspnea  3     Symptoms No     Resting HR 86 bpm     Resting BP 130/70     Max Ex. HR 126 bpm     Max Ex. BP 182/66     2 Minute Post BP 138/80       Interval HR   Baseline HR 86     1 Minute HR 95     2 Minute HR 118     3 Minute HR 120     4 Minute HR 124     5 Minute HR 122     6 Minute HR 126     2 Minute Post HR 95     Interval Heart Rate? Yes       Interval Oxygen   Interval Oxygen? Yes     Baseline Oxygen Saturation % 95 %     Baseline Liters of Oxygen 3 L     1 Minute Oxygen Saturation % 91 %     1 Minute Liters of Oxygen 3 L     2 Minute Oxygen Saturation % 90 %     2 Minute Liters of Oxygen 3 L     3 Minute Oxygen Saturation % 90 %     3 Minute Liters of Oxygen 3 L     4 Minute Oxygen Saturation % 90 %     4 Minute Liters of Oxygen 3 L     5 Minute Oxygen Saturation % 88 %     5 Minute Liters of Oxygen 3 L     6 Minute Oxygen Saturation % 88 %     6 Minute Liters of Oxygen 3 L     2 Minute Post Oxygen Saturation % 98 %     2 Minute Post Liters of Oxygen 3 L        Oxygen Initial Assessment:     Oxygen Initial Assessment - 08/15/16 0957      Home Oxygen   Home Oxygen Device E-Tanks;Home Concentrator   Sleep Oxygen Prescription Continuous   Liters per minute 2   Home Exercise Oxygen Prescription Continuous   Home at Rest Exercise Oxygen Prescription Continuous   Compliance with Home Oxygen Use Yes     Program Oxygen Prescription   Program Oxygen Prescription Continuous     Intervention   Short Term Goals To learn and  exhibit compliance with exercise, home and travel O2 prescription;To learn and understand importance of monitoring SPO2 with pulse oximeter and demonstrate accurate use of the pulse oximeter.;To Learn and understand importance of maintaining oxygen saturations>88%;To learn  and demonstrate proper purse lipped breathing techniques or other breathing techniques.;To learn and demonstrate proper use of respiratory medications   Long  Term Goals Exhibits compliance with exercise, home and travel O2 prescription      Oxygen Re-Evaluation:     Oxygen Re-Evaluation    [place] Name 08/15/16 0957 09/12/16 1159 10/10/16 1655 11/08/16 0804       Program Oxygen Prescription   Program Oxygen Prescription  - Continuous Continuous Continuous    Liters per minute _0 Home Oxygen   Home Oxygen Device  - E-Tanks;Home Concentrator E-Tanks;Home Concentrator E-Tanks;Home Concentrator  pt is contemplating purchasing a POC. She will need a POC that delivers 5 liters pulse. In communication with MD regarding patients home oxygen needs.    Sleep Oxygen Prescription  - Continuous Continuous Continuous    Liters per minute  - _1 Home Exercise Oxygen Prescription  - Continuous Continuous Continuous    Liters per minute _2 Home at Rest Exercise Oxygen Prescription  - Continuous Continuous Continuous    Liters per minute 3 3  - 3    Compliance with Home Oxygen Use  - Yes Yes Yes      Goals/Expected Outcomes   Short Term Goals  - To learn and exhibit compliance with exercise, home and travel O2 prescription;To learn and understand importance of monitoring SPO2 with pulse oximeter and demonstrate accurate use of the pulse oximeter.;To Learn and understand importance of maintaining oxygen saturations>88%;To learn and demonstrate proper purse lipped breathing techniques or other breathing techniques.;To learn and demonstrate proper use of respiratory medications To learn and exhibit compliance with exercise, home and travel O2 prescription;To learn and understand importance of monitoring SPO2 with pulse oximeter and demonstrate accurate use of the pulse oximeter.;To Learn and understand importance of maintaining oxygen saturations>88%;To learn and demonstrate proper  purse lipped breathing techniques or other breathing techniques.;To learn and demonstrate proper use of respiratory medications To learn and exhibit compliance with exercise, home and travel O2 prescription;To learn and understand importance of monitoring SPO2 with pulse oximeter and demonstrate accurate use of the pulse oximeter.;To Learn and understand importance of maintaining oxygen saturations>88%;To learn and demonstrate proper purse lipped breathing techniques or other breathing techniques.;To learn and demonstrate proper use of respiratory medications    Long  Term Goals  - Exhibits compliance with exercise, home and travel O2 prescription Exhibits compliance with exercise, home and travel O2 prescription Exhibits compliance with exercise, home and travel O2 prescription;Verbalizes importance of monitoring SPO2 with pulse oximeter and return demonstration;Maintenance of O2 saturations>88%;Exhibits proper breathing techniques, such as purse lipped breathing or other method taught during program session;Compliance with respiratory medication    Comments  - Dorrette is doing well adapting to continuous use of oxygen. She verbalized compliance with checking her oxygen saturations at home and wearing her oxygen most of the time. She states she will take it off while sitting  while still maintaining saturations mid to high 90s. Dorrette is doing well adapting to continuous use of oxygen. She verbalized compliance with checking her oxygen saturations at home and wearing her oxygen most of the time. She states she will take it off while sitting  while still maintaining saturations mid to high 90s. Will continue to reinforse the importants of turning off her portable etank when not in use as she has left it own during rehab. Dorrette is doing well adapting to continuous use of oxygen. She verbalized compliance with checking her oxygen saturations at home and wearing her oxygen most of the time. She states  she will take it off while sitting  while still maintaining saturations mid to high 90s. Will continue to reinforse the importants of turning off her portable etank when not in use as she has left it own during rehab.    Goals/Expected Outcomes  - exhibits/verbalizes continued compliance with exercise, home, and travel O2 prescription exhibits/verbalizes continued compliance with exercise, home, and travel O2 prescription exhibits/verbalizes continued compliance with exercise, home, and travel O2 prescription       Oxygen Discharge (Final Oxygen Re-Evaluation):     Oxygen Re-Evaluation - 11/08/16 0804      Program Oxygen Prescription   Program Oxygen Prescription Continuous   Liters per minute 3     Home Oxygen   Home Oxygen Device E-Tanks;Home Concentrator  pt is contemplating purchasing a POC. She will need a POC that delivers 5 liters pulse. In communication with MD regarding patients home oxygen needs.   Sleep Oxygen Prescription Continuous   Liters per minute 2   Home Exercise Oxygen Prescription Continuous   Liters per minute 3   Home at Rest Exercise Oxygen Prescription Continuous   Liters per minute 3   Compliance with Home Oxygen Use Yes     Goals/Expected Outcomes   Short Term Goals To learn and exhibit compliance with exercise, home and travel O2 prescription;To learn and understand importance of monitoring SPO2 with pulse oximeter and demonstrate accurate use of the pulse oximeter.;To Learn and understand importance of maintaining oxygen saturations>88%;To learn and demonstrate proper purse lipped breathing techniques or other breathing techniques.;To learn and demonstrate proper use of respiratory medications   Long  Term Goals Exhibits compliance with exercise, home and travel O2 prescription;Verbalizes importance of monitoring SPO2 with pulse oximeter and return demonstration;Maintenance of O2 saturations>88%;Exhibits proper breathing techniques, such as purse lipped  breathing or other method taught during program session;Compliance with respiratory medication   Comments Dorrette is doing well adapting to continuous use of oxygen. She verbalized compliance with checking her oxygen saturations at home and wearing her oxygen most of the time. She states she will take it off while sitting  while still maintaining saturations mid to high 90s. Will continue to reinforse the importants of turning off her portable etank when not in use as she has left it own during rehab.   Goals/Expected Outcomes exhibits/verbalizes continued compliance with exercise, home, and travel O2 prescription      Initial Exercise Prescription:     Initial Exercise Prescription - 07/21/16 1600      Date of Initial Exercise RX and Referring Provider   Date 07/14/16   Referring Provider Dr. Lake Bells     Oxygen   Oxygen Continuous   Liters 3     NuStep   Level 2   Minutes 17   METs 1.5     Arm Ergometer   Level 1   Minutes 17     Track   Laps 8   Minutes 17     Prescription Details   Frequency (times per week) 2   Duration Progress to 45 minutes of aerobic exercise without signs/symptoms of physical distress  Intensity   THRR 40-80% of Max Heartrate 58-115   Ratings of Perceived Exertion 11-13   Perceived Dyspnea 0-4     Progression   Progression Continue progressive overload as per policy without signs/symptoms or physical distress.     Resistance Training   Training Prescription Yes   Weight blue bands   Reps 10-12      Perform Capillary Blood Glucose checks as needed.  Exercise Prescription Changes:     Exercise Prescription Changes    Row Name 07/28/16 1240 08/02/16 1200 08/04/16 1200 08/11/16 1300 08/18/16 1300     Response to Exercise   Blood Pressure (Admit) 148/69 124/60 128/60 140/66 120/60   Blood Pressure (Exercise) 150/60 138/52 130/60 160/84 164/66   Blood Pressure (Exit) 140/62 126/60 112/60 124/80 124/66   Heart Rate (Admit) 95 bpm 84  bpm 90 bpm 85 bpm 99 bpm   Heart Rate (Exercise) 113 bpm 107 bpm 106 bpm 105 bpm 126 bpm   Heart Rate (Exit) 84 bpm 80 bpm 83 bpm 91 bpm 98 bpm   Oxygen Saturation (Admit) 98 % 100 % 98 % 98 % 98 %   Oxygen Saturation (Exercise) 95 % 93 % 93 % 93 % 88 %   Oxygen Saturation (Exit) 97 % 98 % 99 % 91 % 98 %   Rating of Perceived Exertion (Exercise) _0 Perceived Dyspnea (Exercise) _1 Duration Progress to 45 minutes of aerobic exercise without signs/symptoms of physical distress Progress to 45 minutes of aerobic exercise without signs/symptoms of physical distress Progress to 45 minutes of aerobic exercise without signs/symptoms of physical distress Progress to 45 minutes of aerobic exercise without signs/symptoms of physical distress Progress to 45 minutes of aerobic exercise without signs/symptoms of physical distress   Intensity Other (comment)  40-80 HRR THRR unchanged THRR unchanged THRR unchanged THRR unchanged     Progression   Progression Continue to progress workloads to maintain intensity without signs/symptoms of physical distress. Continue to progress workloads to maintain intensity without signs/symptoms of physical distress. Continue to progress workloads to maintain intensity without signs/symptoms of physical distress. Continue to progress workloads to maintain intensity without signs/symptoms of physical distress. Continue to progress workloads to maintain intensity without signs/symptoms of physical distress.     Resistance Training   Training Prescription _2    Weight _3    Reps 10-12 10-15 10-15 10-15 10-15   Time  - 10 Minutes 10 Minutes 10 Minutes 10 Minutes     Interval Training   Interval Training  -  - No No No     Oxygen   Oxygen _4    Liters _5 Recumbant Bike   Level  - _6 Minutes  - _7 NuStep    Level  - 2  -  -  -   Minutes  - 17  -  -  -   METs  - 1.7  -  -  -     Arm Ergometer   Level 1  -  -  -  -   Minutes 17  -  -  -  -     Track   Laps -  pt did not count steps _8 Minutes  _0 Row Name 08/23/16 1229 08/25/16 1200 08/30/16 1239 09/01/16 1200 09/06/16 1200     Response to Exercise   Blood Pressure (Admit) 128/68 134/56 152/64 140/72 140/58   Blood Pressure (Exercise) 154/80 150/60 150/80 138/76 142/78   Blood Pressure (Exit) 104/60 116/78 142/90 124/60 138/78   Heart Rate (Admit) 92 bpm 80 bpm 97 bpm 86 bpm 87 bpm   Heart Rate (Exercise) 108 bpm 107 bpm 119 bpm 106 bpm 115 bpm   Heart Rate (Exit) 77 bpm 77 bpm 87 bpm 81 bpm 91 bpm   Oxygen Saturation (Admit) 98 % 99 % 99 % 96 % 98 %   Oxygen Saturation (Exercise) 92 % 94 % 96 % 94 % 94 %   Oxygen Saturation (Exit) 98 % 99 % 99 % 96 % 97 %   Rating of Perceived Exertion (Exercise) _1 Perceived Dyspnea (Exercise) _2 Duration Progress to 45 minutes of aerobic exercise without signs/symptoms of physical distress Progress to 45 minutes of aerobic exercise without signs/symptoms of physical distress Progress to 45 minutes of aerobic exercise without signs/symptoms of physical distress Progress to 45 minutes of aerobic exercise without signs/symptoms of physical distress Progress to 45 minutes of aerobic exercise without signs/symptoms of physical distress   Intensity _3      Progression   Progression Continue to progress workloads to maintain intensity without signs/symptoms of physical distress. Continue to progress workloads to maintain intensity without signs/symptoms of physical distress. Continue to progress workloads to maintain intensity without signs/symptoms of physical distress. Continue to progress workloads to maintain intensity without signs/symptoms of physical distress. Continue to progress  workloads to maintain intensity without signs/symptoms of physical distress.     Resistance Training   Training Prescription _4    Weight _5    Reps 10-15 10-15 10-15 10-15 10-15   Time 10 Minutes 10 Minutes 10 Minutes 10 Minutes 10 Minutes     Interval Training   Interval Training _6      Oxygen   Oxygen _7    Liters _8 Recumbant Bike   Level _9 Minutes _10 NuStep   Level 3  - _11 Minutes 17  - _12 METs 1.7  - 1.8 1.7 1.8     Track   Laps _13 - 11   Minutes _14 - 17   Row Name 09/08/16 1247 09/15/16 1226 09/20/16 1227 09/22/16 1200 09/29/16 1200     Response to Exercise   Blood Pressure (Admit) 140/70 152/60 136/64 132/60 154/66   Blood Pressure (Exercise) 160/70 140/70 136/70 144/80 158/60   Blood Pressure (Exit) 124/60 110/60 138/68 124/64 114/66   Heart Rate (Admit) 82 bpm 109 bpm 100 bpm 91 bpm 88 bpm   Heart Rate (Exercise) 137 bpm 116 bpm 127 bpm 124 bpm 123 bpm   Heart Rate (Exit) 96 bpm 86 bpm 97 bpm 88 bpm 96 bpm   Oxygen Saturation (Admit) 98 % 96 % 98 % 96 % 93 %   Oxygen Saturation (Exercise) 90 % 94 % 89 % 90 % 91 %  Oxygen Saturation (Exit) 98 % 97 % 95 % 98 % 97 %   Rating of Perceived Exertion (Exercise) _0 Perceived Dyspnea (Exercise) _1 Duration Progress to 45 minutes of aerobic exercise without signs/symptoms of physical distress Progress to 45 minutes of aerobic exercise without signs/symptoms of physical distress Progress to 45 minutes of aerobic exercise without signs/symptoms of physical distress Progress to 45 minutes of aerobic exercise without signs/symptoms of physical distress Progress to 45 minutes of aerobic exercise without signs/symptoms of physical distress   Intensity THRR unchanged THRR unchanged THRR unchanged THRR unchanged THRR  unchanged     Progression   Progression Continue to progress workloads to maintain intensity without signs/symptoms of physical distress. Continue to progress workloads to maintain intensity without signs/symptoms of physical distress. Continue to progress workloads to maintain intensity without signs/symptoms of physical distress. Continue to progress workloads to maintain intensity without signs/symptoms of physical distress. Continue to progress workloads to maintain intensity without signs/symptoms of physical distress.     Resistance Training   Training Prescription _2    Weight _3    Reps 10-15 10-15 10-15 10-15 10-15   Time 10 Minutes 10 Minutes 10 Minutes 10 Minutes 10 Minutes     Interval Training   Interval Training _4      Oxygen   Oxygen _5    Liters _6 Recumbant Bike   Level _7 - 3   Minutes _8 - 17     NuStep   Level  - _9 -   Minutes  - _10 -   METs  - 2.1 1.8 1.9  -     Track   Laps 14  - _11 Minutes 17  - _12 Row Name 10/06/16 1302 10/27/16 1300 11/01/16 1222         Response to Exercise   Blood Pressure (Admit) 130/66 160/70 140/66     Blood Pressure (Exercise) 148/68 166/80 160/68     Blood Pressure (Exit) 130/63 130/72 108/72     Heart Rate (Admit) 93 bpm 94 bpm 105 bpm     Heart Rate (Exercise) 113 bpm 108 bpm 134 bpm     Heart Rate (Exit) 89 bpm 93 bpm 86 bpm     Oxygen Saturation (Admit) 98 % 96 % 96 %     Oxygen Saturation (Exercise) 92 % 94 % 86 %     Oxygen Saturation (Exit) 97 % 96 % 98 %     Rating of Perceived Exertion (Exercise) _13 Perceived Dyspnea (Exercise) _14 Duration Progress to 45 minutes of aerobic exercise without signs/symptoms of physical distress Progress to 45 minutes of aerobic exercise without signs/symptoms of physical distress Progress  to 45 minutes of aerobic exercise without signs/symptoms of physical distress     Intensity THRR unchanged THRR unchanged THRR unchanged       Progression   Progression Continue to progress workloads to maintain intensity without signs/symptoms of physical distress. Continue to progress workloads to maintain intensity without signs/symptoms of physical distress. Continue to progress workloads to maintain intensity without signs/symptoms of physical distress.  Resistance Training   Training Prescription Yes Yes Yes     Weight blue bands blue bands blue bands     Reps 10-15 10-15 10-15     Time 10 Minutes 10 Minutes 10 Minutes       Interval Training   Interval Training No No No       Oxygen   Oxygen Continuous  - Continuous  texted pt on pulse. desaturation noted on 5 liters     Liters 3  - 3       Recumbant Bike   Level  -  - 3     Minutes  -  - 17       NuStep   Level _0 Minutes _1 METs 1.8 1.9 1.7       Track   Laps _2 Minutes _3 Exercise Comments:   Exercise Goals and Review:   Exercise Goals Re-Evaluation :     Exercise Goals Re-Evaluation    Row Name 08/15/16 1606 09/13/16 0716 09/13/16 0719 11/07/16 0959       Exercise Goal Re-Evaluation   Exercise Goals Review Increase Strenth and Stamina;Increase Physical Activity Increase Physical Activity;Increase Strenth and Stamina  - Increase Physical Activity;Increase Strenth and Stamina    Comments Patient has only attended four exercise sessions. Has a hard time pushing herself. 1.7 MET average on Stepper. Will cont. to monitor and progress.  Patient is not making many gains in Pulmonary Rehab. Patient has been maintaining 11-14 laps (200 feet each) on track in 15 minutes since 07/28/16 (start of program). 1.7 METS on stepper  - Patient is not making gains in Pulmonary Rehab. Over the last 4 weeks she has only been attending one session a week. On average she works at a  "fairly light" intensity. Patient is not exercising at home. Will cont. to monitor.     Expected Outcomes Through the exercise here at rehab the patient with increase physical capacity, strength, and stamina.  - Through the exercise here at rehab the patient with increase physical capacity, strength, and stamina. Through the exercise here at rehab the patient with increase physical capacity, strength, and stamina.       Discharge Exercise Prescription (Final Exercise Prescription Changes):     Exercise Prescription Changes - 11/01/16 1222      Response to Exercise   Blood Pressure (Admit) 140/66   Blood Pressure (Exercise) 160/68   Blood Pressure (Exit) 108/72   Heart Rate (Admit) 105 bpm   Heart Rate (Exercise) 134 bpm   Heart Rate (Exit) 86 bpm   Oxygen Saturation (Admit) 96 %   Oxygen Saturation (Exercise) 86 %   Oxygen Saturation (Exit) 98 %   Rating of Perceived Exertion (Exercise) 12   Perceived Dyspnea (Exercise) 2   Duration Progress to 45 minutes of aerobic exercise without signs/symptoms of physical distress   Intensity THRR unchanged     Progression   Progression Continue to progress workloads to maintain intensity without signs/symptoms of physical distress.     Resistance Training   Training Prescription Yes   Weight blue bands   Reps 10-15   Time 10 Minutes     Interval Training   Interval Training No     Oxygen   Oxygen Continuous  texted pt on pulse. desaturation noted on 5 liters   Liters 3  Recumbant Bike   Level 3   Minutes 17     NuStep   Level 4   Minutes 17   METs 1.7     Track   Laps 9   Minutes 17      Nutrition:  Target Goals: Understanding of nutrition guidelines, daily intake of sodium <1535m, cholesterol <2052m calories 30% from fat and 7% or less from saturated fats, daily to have 5 or more servings of fruits and vegetables.  Biometrics:     Pre Biometrics - 07/18/16 1339      Pre Biometrics   Grip Strength 33 kg        Nutrition Therapy Plan and Nutrition Goals:     Nutrition Therapy & Goals - 09/01/16 1323      Nutrition Therapy   Diet Therapeutic Lifestyle Changes     Personal Nutrition Goals   Nutrition Goal Maintain wt ~227 lb while in Pulmonary Rehab     Intervention Plan   Intervention Prescribe, educate and counsel regarding individualized specific dietary modifications aiming towards targeted core components such as weight, hypertension, lipid management, diabetes, heart failure and other comorbidities.;Nutrition handout(s) given to patient.  Low Sodium Nutrition Therapy   Expected Outcomes Short Term Goal: Understand basic principles of dietary content, such as calories, fat, sodium, cholesterol and nutrients.;Long Term Goal: Adherence to prescribed nutrition plan.      Nutrition Discharge: Rate Your Plate Scores:     Nutrition Assessments - 09/01/16 1212      Rate Your Plate Scores   Pre Score 51      Nutrition Goals Re-Evaluation:   Nutrition Goals Discharge (Final Nutrition Goals Re-Evaluation):   Psychosocial: Target Goals: Acknowledge presence or absence of significant depression and/or stress, maximize coping skills, provide positive support system. Participant is able to verbalize types and ability to use techniques and skills needed for reducing stress and depression.  Initial Review & Psychosocial Screening:     Initial Psych Review & Screening - 07/18/16 1341      Initial Review   Current issues with --  none identified     Family Dynamics   Good Support System? Yes     Barriers   Psychosocial barriers to participate in program There are no identifiable barriers or psychosocial needs.     Screening Interventions   Interventions Encouraged to exercise      Quality of Life Scores:   PHQ-9: Recent Review Flowsheet Data    [facility]9 07/18/2016 02/16/2015   Decreased Interest 0 0   Down, Depressed, Hopeless 0 0   PHQ - 2 Score 0 0      Interpretation of Total Score  Total Score Depression Severity:  1-4 = Minimal depression, 5-9 = Mild depression, 10-14 = Moderate depression, 15-19 = Moderately severe depression, 20-27 = Severe depression   Psychosocial Evaluation and Intervention:     Psychosocial Evaluation - 07/18/16 1342      Psychosocial Evaluation & Interventions   Interventions Encouraged to exercise with the program and follow exercise prescription   Continue Psychosocial Services  No      Psychosocial Re-Evaluation:     Psychosocial Re-Evaluation    [place] 08/15/16 1019 08/15/16 1213 09/12/16 1211 10/10/16 1659 11/08/16 0807     Psychosocial Re-Evaluation   Current issues with None Identified  - None Identified None Identified Current Stress Concerns   Expected Outcomes  - patient will remain free from psycosocial barriers  patient will remain free from psycosocial  barriers  patient will remain free from psycosocial barriers  patient will remain free from psycosocial barriers    Interventions Encouraged to attend Pulmonary Rehabilitation for the exercise  - Encouraged to attend Pulmonary Rehabilitation for the exercise Encouraged to attend Pulmonary Rehabilitation for the exercise Encouraged to attend Pulmonary Rehabilitation for the exercise   Continue Psychosocial Services  No Follow up required  - No Follow up required  - No Follow up required   Comments  -  -  -  - patient frustrated with continuous oxygen needs and having to manage oxygen while attempting to maintain an active lifestyle ie grocery shopping, dinners out     Initial Review   Source of Stress Concerns  -  -  -  - Chronic Illness      Psychosocial Discharge (Final Psychosocial Re-Evaluation):     Psychosocial Re-Evaluation - 11/08/16 0807      Psychosocial Re-Evaluation   Current issues with Current Stress Concerns   Expected Outcomes patient will remain free from psycosocial barriers    Interventions Encouraged to attend  Pulmonary Rehabilitation for the exercise   Continue Psychosocial Services  No Follow up required   Comments patient frustrated with continuous oxygen needs and having to manage oxygen while attempting to maintain an active lifestyle ie grocery shopping, dinners out     Initial Review   Source of Stress Concerns Chronic Illness      Education: Education Goals: Education classes will be provided on a weekly basis, covering required topics. Participant will state understanding/return demonstration of topics presented.  Learning Barriers/Preferences:     Learning Barriers/Preferences - 07/18/16 1337      Learning Barriers/Preferences   Learning Barriers None   Learning Preferences Audio;Group Instruction;Individual Instruction;Pictoral;Skilled Demonstration;Verbal Instruction      Education Topics: Risk Factor Reduction:  -Group instruction that is supported by a PowerPoint presentation. Instructor discusses the definition of a risk factor, different risk factors for pulmonary disease, and how the heart and lungs work together.     PULMONARY REHAB OTHER RESPIRATORY from 11/03/2016 in [place]  Date  08/25/16  Educator  EP  Instruction Review Code  2- meets goals/outcomes      Nutrition for Pulmonary Patient:  -Group instruction provided by PowerPoint slides, verbal discussion, and written materials to support subject matter. The instructor gives an explanation and review of healthy diet recommendations, which includes a discussion on weight management, recommendations for fruit and vegetable consumption, as well as protein, fluid, caffeine, fiber, sodium, sugar, and alcohol. Tips for eating when patients are short of breath are discussed.   PULMONARY REHAB OTHER RESPIRATORY from 11/03/2016 in [place]  Date  08/18/16  Educator  RD  Instruction Review Code  2- meets goals/outcomes      Pursed Lip Breathing:   -Group instruction that is supported by demonstration and informational handouts. Instructor discusses the benefits of pursed lip and diaphragmatic breathing and detailed demonstration on how to preform both.     Oxygen Safety:  -Group instruction provided by PowerPoint, verbal discussion, and written material to support subject matter. There is an overview of "What is Oxygen" and "Why do we need it".  Instructor also reviews how to create a safe environment for oxygen use, the importance of using oxygen as prescribed, and the risks of noncompliance. There is a brief discussion on traveling with oxygen and resources the patient may utilize.   PULMONARY REHAB OTHER RESPIRATORY from 11/03/2016  in [place]  Date  09/08/16  Educator  Truddie Crumble  Instruction Review Code  2- meets goals/outcomes      Oxygen Equipment:  -Group instruction provided by [facility] Staff utilizing handouts, written materials, and equipment demonstrations.   PULMONARY REHAB OTHER RESPIRATORY from 11/03/2016 in [place]  Date  10/27/16  Educator  [facility]  Instruction Review Code  2- meets goals/outcomes      Signs and Symptoms:  -Group instruction provided by written material and verbal discussion to support subject matter. Warning signs and symptoms of infection, stroke, and heart attack are reviewed and when to call the physician/911 reinforced. Tips for preventing the spread of infection discussed.   PULMONARY REHAB OTHER RESPIRATORY from 11/03/2016 in [place]  Date  10/06/16  Educator  rn  Instruction Review Code  2- meets goals/outcomes      Advanced Directives:  -Group instruction provided by verbal instruction and written material to support subject matter. Instructor reviews Advanced Directive laws and proper instruction for filling out document.   Pulmonary Video:  -Group video education that  reviews the importance of medication and oxygen compliance, exercise, good nutrition, pulmonary hygiene, and pursed lip and diaphragmatic breathing for the pulmonary patient.   PULMONARY REHAB OTHER RESPIRATORY from 11/03/2016 in [place]  Date  09/15/16  Instruction Review Code  R- Review/reinforce      Exercise for the Pulmonary Patient:  -Group instruction that is supported by a PowerPoint presentation. Instructor discusses benefits of exercise, core components of exercise, frequency, duration, and intensity of an exercise routine, importance of utilizing pulse oximetry during exercise, safety while exercising, and options of places to exercise outside of rehab.     PULMONARY REHAB OTHER RESPIRATORY from 11/03/2016 in [place]  Date  09/22/16  Educator  EP  Instruction Review Code  2- meets goals/outcomes      Pulmonary Medications:  -Verbally interactive group education provided by instructor with focus on inhaled medications and proper administration.   PULMONARY REHAB OTHER RESPIRATORY from 11/03/2016 in [place]  Date  09/01/16  Educator  pharm  Instruction Review Code  2- meets goals/outcomes      Anatomy and Physiology of the Respiratory System and Intimacy:  -Group instruction provided by PowerPoint, verbal discussion, and written material to support subject matter. Instructor reviews respiratory cycle and anatomical components of the respiratory system and their functions. Instructor also reviews differences in obstructive and restrictive respiratory diseases with examples of each. Intimacy, Sex, and Sexuality differences are reviewed with a discussion on how relationships can change when diagnosed with pulmonary disease. Common sexual concerns are reviewed.   PULMONARY REHAB OTHER RESPIRATORY from 11/03/2016 in [place]  Date  08/11/16  Educator   RN  Instruction Review Code  2- meets goals/outcomes      MD DAY -A group question and answer session with a medical doctor that allows participants to ask questions that relate to their pulmonary disease state.   OTHER EDUCATION -Group or individual verbal, written, or video instructions that support the educational goals of the pulmonary rehab program.   Knowledge Questionnaire Score:   Core Components/Risk Factors/Patient Goals at Admission:     Personal Goals and Risk Factors at Admission - 07/18/16 1339      Core Components/Risk Factors/Patient Goals on Admission   Increase Strength and  Stamina Yes   Improve shortness of breath with ADL's Yes   Develop more efficient breathing techniques such as purse lipped breathing and diaphragmatic breathing; and practicing self-pacing with activity Yes   Increase knowledge of respiratory medications and ability to use respiratory devices properly  Yes      Core Components/Risk Factors/Patient Goals Review:      Goals and Risk Factor Review    Row Name 07/18/16 1340 08/15/16 1009 09/12/16 1211 10/10/16 1658 11/08/16 0807     Core Components/Risk Factors/Patient Goals Review   Personal Goals Review Increase Strength and Stamina;Improve shortness of breath with ADL's;Develop more efficient breathing techniques such as purse lipped breathing and diaphragmatic breathing and practicing self-pacing with activity.;Increase knowledge of respiratory medications and ability to use respiratory devices properly. Increase knowledge of respiratory medications and ability to use respiratory devices properly. Increase knowledge of respiratory medications and ability to use respiratory devices properly. Improve shortness of breath with ADL's;Develop more efficient breathing techniques such as purse lipped breathing and diaphragmatic breathing and practicing self-pacing with activity. Improve shortness of breath with ADL's;Develop more efficient breathing  techniques such as purse lipped breathing and diaphragmatic breathing and practicing self-pacing with activity.   Review  - see comments section in ITP see comments section in ITP see comments section in ITP see comments section in ITP   Expected Outcomes  - see expected outcomes on Admission ITP see expected outcomes on Admission ITP see expected outcomes on Admission ITP see expected outcomes on Admission ITP      Core Components/Risk Factors/Patient Goals at Discharge (Final Review):      Goals and Risk Factor Review - 11/08/16 0807      Core Components/Risk Factors/Patient Goals Review   Personal Goals Review Improve shortness of breath with ADL's;Develop more efficient breathing techniques such as purse lipped breathing and diaphragmatic breathing and practicing self-pacing with activity.   Review see comments section in ITP   Expected Outcomes see expected outcomes on Admission ITP      ITP Comments:   Comments: ITP REVIEW Pt is not making expected progress toward pulmonary rehab goals after completing 18 sessions. She progressed to level 3/4 on the equipment but is now at a standstill. She is extremely concerned about the use of oxygen continuously. She is not exercising at home even though she verbalized the importance of maintaining her stamina and strength. She is very forgetful and often in a "rush". She often verbalized that she doesn't understand what is wrong with her lungs. She is given reassurance and reeducated regarding her diagnosis. Will continue to work with pulmonologist in order to provide the best portable home oxygen device for patient.  Recommend continued exercise, life style modification, education, and utilization of breathing techniques to increase stamina and strength and decrease shortness of breath with exertion.

## 2016-11-10 ENCOUNTER — Encounter (HOSPITAL_COMMUNITY): Payer: Medicare Other

## 2016-11-10 DIAGNOSIS — H2511 Age-related nuclear cataract, right eye: Secondary | ICD-10-CM | POA: Diagnosis not present

## 2016-11-10 DIAGNOSIS — H25811 Combined forms of age-related cataract, right eye: Secondary | ICD-10-CM | POA: Diagnosis not present

## 2016-11-10 DIAGNOSIS — H25011 Cortical age-related cataract, right eye: Secondary | ICD-10-CM | POA: Diagnosis not present

## 2016-11-22 ENCOUNTER — Encounter (HOSPITAL_COMMUNITY)
Admission: RE | Admit: 2016-11-22 | Discharge: 2016-11-22 | Disposition: A | Discharge status: Home / Self Care | Payer: Medicare Other | Source: Ambulatory Visit | Attending: Pulmonary Disease | Admitting: Pulmonary Disease

## 2016-11-22 VITALS — Wt 224.0 lb

## 2016-11-22 DIAGNOSIS — J679 Hypersensitivity pneumonitis due to unspecified organic dust: Secondary | ICD-10-CM | POA: Diagnosis not present

## 2016-11-22 DIAGNOSIS — J8410 Pulmonary fibrosis, unspecified: Secondary | ICD-10-CM | POA: Diagnosis not present

## 2016-11-22 NOTE — Progress Notes (Signed)
Daily Session Note  Patient Details  Name: [name] MRN: [id number] Date of Birth: Sep 22, 1940 Referring Provider:     Pulmonary Rehab Walk Test from 07/21/2016 in [place]  Referring Provider  Dr. Lake Bells      Encounter Date: 11/22/2016  Check In:     Session Check In - 11/22/16 1029      Check-In   Location [facility]   Staff Present Rosebud Poles, RN, BSN;Molly diVincenzo, MS, ACSM RCEP, Exercise Physiologist;Portia Rollene Rotunda, RN, BSN   Supervising physician immediately available to respond to emergencies [facility] immediately available   Physician(s) Dr. Nada Maclachlan   Medication changes reported     No   Fall or balance concerns reported    No   Tobacco Cessation No Change   Warm-up and Cool-down Performed as group-led instruction   Resistance Training Performed Yes   VAD Patient? No     Pain Assessment   Currently in Pain? No/denies   Multiple Pain Sites No      Capillary Blood Glucose: No results found for this or any previous visit (from the past 24 hour(s)).      Exercise Prescription Changes - 11/22/16 1200      Response to Exercise   Blood Pressure (Admit) 134/58   Blood Pressure (Exercise) 142/60   Blood Pressure (Exit) 132/70   Heart Rate (Admit) 85 bpm   Heart Rate (Exercise) 106 bpm   Heart Rate (Exit) 80 bpm   Oxygen Saturation (Admit) 98 %   Oxygen Saturation (Exercise) 95 %   Oxygen Saturation (Exit) 98 %   Rating of Perceived Exertion (Exercise) 13   Perceived Dyspnea (Exercise) 2   Duration Progress to 45 minutes of aerobic exercise without signs/symptoms of physical distress   Intensity THRR unchanged     Progression   Progression Continue to progress workloads to maintain intensity without signs/symptoms of physical distress.     Resistance Training   Training Prescription Yes   Weight blue bands   Reps 10-15   Time 10 Minutes     Interval Training   Interval Training No     Oxygen   Oxygen Continuous   Liters 3     Recumbant Bike   Level 3   Minutes 17     NuStep   Level 5   Minutes 17   METs 1.7     Track   Laps 16   Minutes 17      History  Smoking Status  . Never Smoker  Smokeless Tobacco  . Never Used    Goals Met:  Exercise tolerated well Strength training completed today  Goals Unmet:  Not Applicable  Comments: Service time is from 1030 to 1200    Dr. Rush Farmer is Medical Director for Pulmonary Rehab at [facility].

## 2016-11-24 ENCOUNTER — Encounter (HOSPITAL_COMMUNITY)
Admission: RE | Admit: 2016-11-24 | Discharge: 2016-11-24 | Disposition: A | Discharge status: Home / Self Care | Payer: Medicare Other | Source: Ambulatory Visit | Attending: Pulmonary Disease | Admitting: Pulmonary Disease

## 2016-11-24 VITALS — Wt 223.1 lb

## 2016-11-24 DIAGNOSIS — J8410 Pulmonary fibrosis, unspecified: Secondary | ICD-10-CM

## 2016-11-24 DIAGNOSIS — J679 Hypersensitivity pneumonitis due to unspecified organic dust: Secondary | ICD-10-CM | POA: Diagnosis not present

## 2016-11-24 NOTE — Progress Notes (Signed)
Daily Session Note  Patient Details  Name: [name] MRN: [id number] Date of Birth: May 13, 1941 Referring Provider:     Pulmonary Rehab Walk Test from 07/21/2016 in [place]  Referring Provider  Dr. Lake Bells      Encounter Date: 11/24/2016  Check In:     Session Check In - 11/24/16 1030      Check-In   Location [facility]   Staff Present Rosebud Poles, RN, BSN;Sorin Frimpong, MS, ACSM RCEP, Exercise Physiologist;Portia Rollene Rotunda, RN, BSN   Supervising physician immediately available to respond to emergencies [facility] immediately available   Physician(s) Dr. Nada Maclachlan   Medication changes reported     No   Fall or balance concerns reported    No   Tobacco Cessation No Change   Warm-up and Cool-down Performed as group-led instruction   Resistance Training Performed Yes   VAD Patient? No     Pain Assessment   Currently in Pain? No/denies   Multiple Pain Sites No      Capillary Blood Glucose: No results found for this or any previous visit (from the past 24 hour(s)).      Exercise Prescription Changes - 11/24/16 1200      Response to Exercise   Blood Pressure (Admit) 130/70   Blood Pressure (Exercise) 144/60   Blood Pressure (Exit) 114/68   Heart Rate (Admit) 83 bpm   Heart Rate (Exercise) 106 bpm   Heart Rate (Exit) 82 bpm   Oxygen Saturation (Admit) 98 %   Oxygen Saturation (Exercise) 95 %   Oxygen Saturation (Exit) 100 %   Rating of Perceived Exertion (Exercise) 12   Perceived Dyspnea (Exercise) 1   Duration Progress to 45 minutes of aerobic exercise without signs/symptoms of physical distress   Intensity THRR unchanged     Progression   Progression Continue to progress workloads to maintain intensity without signs/symptoms of physical distress.     Resistance Training   Training Prescription Yes   Weight blue bands   Reps 10-15   Time 10 Minutes     Interval Training   Interval Training No      Oxygen   Oxygen Continuous   Liters 3     Recumbant Bike   Level 3   Minutes 17     NuStep   Level 4   Minutes 17   METs 1.8      History  Smoking Status  . Never Smoker  Smokeless Tobacco  . Never Used    Goals Met:  Exercise tolerated well No report of cardiac concerns or symptoms Strength training completed today  Goals Unmet:  Not Applicable  Comments: Service time is from 10:30a to 12:20p    Dr. Rush Farmer is Medical Director for Pulmonary Rehab at [facility]

## 2016-11-28 NOTE — Progress Notes (Signed)
PFT done.  

## 2016-11-29 ENCOUNTER — Encounter (HOSPITAL_COMMUNITY): Payer: Medicare Other

## 2016-12-01 ENCOUNTER — Encounter (HOSPITAL_COMMUNITY): Payer: Medicare Other

## 2016-12-01 DIAGNOSIS — H25012 Cortical age-related cataract, left eye: Secondary | ICD-10-CM | POA: Diagnosis not present

## 2016-12-01 DIAGNOSIS — H25812 Combined forms of age-related cataract, left eye: Secondary | ICD-10-CM | POA: Diagnosis not present

## 2016-12-01 DIAGNOSIS — H2512 Age-related nuclear cataract, left eye: Secondary | ICD-10-CM | POA: Diagnosis not present

## 2016-12-06 ENCOUNTER — Encounter (HOSPITAL_COMMUNITY)
Admission: RE | Admit: 2016-12-06 | Discharge: 2016-12-06 | Disposition: A | Discharge status: Home / Self Care | Payer: Medicare Other | Source: Ambulatory Visit | Attending: Pulmonary Disease | Admitting: Pulmonary Disease

## 2016-12-06 DIAGNOSIS — J679 Hypersensitivity pneumonitis due to unspecified organic dust: Secondary | ICD-10-CM | POA: Insufficient documentation

## 2016-12-06 DIAGNOSIS — J8410 Pulmonary fibrosis, unspecified: Secondary | ICD-10-CM

## 2016-12-08 ENCOUNTER — Encounter (HOSPITAL_COMMUNITY)
Admission: RE | Admit: 2016-12-08 | Discharge: 2016-12-08 | Disposition: A | Discharge status: Home / Self Care | Payer: Medicare Other | Source: Ambulatory Visit | Attending: Pulmonary Disease | Admitting: Pulmonary Disease

## 2016-12-08 DIAGNOSIS — J8410 Pulmonary fibrosis, unspecified: Secondary | ICD-10-CM

## 2016-12-08 NOTE — Progress Notes (Signed)
Pulmonary Individual Treatment Plan  Patient Details  Name: [name] MRN: [id number] Date of Birth: 04-25-1941 Referring Provider:     Pulmonary Rehab Walk Test from 07/21/2016 in [place] [facility]  Referring Provider  Dr. Lake Bells      Initial Encounter Date:    Pulmonary Rehab Walk Test from 07/21/2016 in [facility]  Date  07/14/16  Referring Provider  Dr. 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  albuterol (PROVENTIL HFA;VENTOLIN HFA) 108 (90 BASE) MCG/ACT inhaler, Inhale 2 puffs into the lungs every 6 (six) hours as needed for wheezing or shortness of breath. , Disp: , Rfl:  .  albuterol (PROVENTIL) (2.5 MG/3ML) 0.083% nebulizer solution, Take 3 mLs (2.5 mg total) by nebulization every 6 (six) hours as needed for wheezing or shortness of breath., Disp: 360 mL, Rfl: 5 .  aspirin 325 MG tablet, Take 650 mg by mouth every 6 (six) hours as needed for mild pain or moderate pain. , Disp: , Rfl:  .  benzonatate (TESSALON) 100 MG capsule, Take 1 capsule (100 mg total) by mouth every 8 (eight) hours as needed for cough., Disp: 45 capsule, Rfl: 1 .  fluticasone (FLONASE) 50 MCG/ACT nasal spray, Place 1 spray into both nostrils daily., Disp: , Rfl:  .  furosemide (LASIX) 20 MG tablet, Take 20 mg by mouth as needed for fluid. , Disp: , Rfl:  .  ibuprofen (ADVIL,MOTRIN) 200 MG tablet, Take 400 mg by mouth every 6 (six) hours as needed for mild pain or moderate pain. , Disp: , Rfl:  .  loratadine (CLARITIN) 5 MG chewable tablet, Chew 5 mg by mouth daily as needed for allergies., Disp: , Rfl:  .  predniSONE (DELTASONE) 10 MG tablet, Take 78m QD x 5days, then 10 mg QD x 5 weeks, Disp: 45 tablet, Rfl: 0  Past Medical History: Past Medical History:  Diagnosis Date  .  Allergic rhinitis    takes Claritin nightly  . Anxiety    but not on any meds  . Arthritis   . Asthma   . Carpal tunnel syndrome   . GERD (gastroesophageal reflux disease)   . History of bronchitis 3-464yrago  . History of colon polyps    benign  . Joint pain   . Peripheral edema    takes Lasix daily  . Shortness of breath dyspnea    with exertion;Albuterol inhaler as needed  . Thyroid nodule     Tobacco Use: History  Smoking Status  . Never Smoker  Smokeless Tobacco  . Never Used    Labs: Recent Review Flowsheet Data    Labs for ITP Cardiac and Pulmonary Rehab Latest Ref Rng & Units 11/18/2014 11/21/2014   PHART 7.350 - 7.450 7.426 7.356   PCO2ART 35.0 - 45.0 mmHg 40.7 49.1(H)   HCO3 20.0 - 24.0 mEq/L 26.3(H) 26.8(H)   TCO2 0 - 100 mmol/L 27.5 28.4   O2SAT % 96.8 96.2      Capillary Blood Glucose: No results found for: GLUCAP   ADL UCSD:   Pulmonary Function Assessment:     Pulmonary Function Assessment - 07/18/16 1337      Breath   Bilateral Breath Sounds Wheezes;Clear   Shortness of Breath Limiting activity;Yes      Exercise Target Goals:    Exercise Program Goal:  Individual exercise prescription set with THRR, safety & activity barriers. Participant demonstrates ability to understand and report RPE using BORG scale, to self-measure pulse accurately, and to acknowledge the importance of the exercise prescription.  Exercise Prescription Goal: Starting with aerobic activity 30 plus minutes a day, 3 days per week for initial exercise prescription. Provide home exercise prescription and guidelines that participant acknowledges understanding prior to discharge.  Activity Barriers & Risk Stratification:     Activity Barriers & Cardiac Risk Stratification - 07/18/16 1335      Activity Barriers & Cardiac Risk Stratification   Activity Barriers Arthritis;Left Knee Replacement;Shortness of Breath;Deconditioning;Muscular Weakness      6 Minute Walk:      6 Minute Walk    Row Name 07/21/16 1622         6 Minute Walk   Phase Initial     Distance 1320 feet     Walk Time 6 minutes     # of Rest Breaks 0     MPH 2.5     METS 2.91     RPE 13     Perceived Dyspnea  3     Symptoms No     Resting HR 86 bpm     Resting BP 130/70     Max Ex. HR 126 bpm     Max Ex. BP 182/66     2 Minute Post BP 138/80       Interval HR   Baseline HR 86     1 Minute HR 95     2 Minute HR 118     3 Minute HR 120     4 Minute HR 124     5 Minute HR 122     6 Minute HR 126     2 Minute Post HR 95     Interval Heart Rate? Yes       Interval Oxygen   Interval Oxygen? Yes     Baseline Oxygen Saturation % 95 %     Baseline Liters of Oxygen 3 L     1 Minute Oxygen Saturation % 91 %     1 Minute Liters of Oxygen 3 L     2 Minute Oxygen Saturation % 90 %     2 Minute Liters of Oxygen 3 L     3 Minute Oxygen Saturation % 90 %     3 Minute Liters of Oxygen 3 L     4 Minute Oxygen Saturation % 90 %     4 Minute Liters of Oxygen 3 L     5 Minute Oxygen Saturation % 88 %     5 Minute Liters of Oxygen 3 L     6 Minute Oxygen Saturation % 88 %     6 Minute Liters of Oxygen 3 L     2 Minute Post Oxygen Saturation % 98 %     2 Minute Post Liters of Oxygen 3 L        Oxygen Initial Assessment:     Oxygen Initial Assessment - 08/15/16 0957      Home Oxygen   Home Oxygen Device E-Tanks;Home Concentrator   Sleep Oxygen Prescription Continuous   Liters per minute 2   Home Exercise Oxygen Prescription Continuous   Home at Rest Exercise Oxygen Prescription Continuous   Compliance with Home Oxygen Use Yes     Program Oxygen Prescription   Program Oxygen Prescription Continuous     Intervention  Short Term Goals To learn and exhibit compliance with exercise, home and travel O2 prescription;To learn and understand importance of monitoring SPO2 with pulse oximeter and demonstrate accurate use of the pulse oximeter.;To Learn and understand  importance of maintaining oxygen saturations>88%;To learn and demonstrate proper purse lipped breathing techniques or other breathing techniques.;To learn and demonstrate proper use of respiratory medications   Long  Term Goals Exhibits compliance with exercise, home and travel O2 prescription      Oxygen Re-Evaluation:     Oxygen Re-Evaluation    [place] Name 08/15/16 0957 09/12/16 1159 10/10/16 1655 11/08/16 0804 12/05/16 1629     Program Oxygen Prescription   Program Oxygen Prescription  - Continuous Continuous Continuous Continuous   Liters per minute _0 Home Oxygen   Home Oxygen Device  - E-Tanks;Home Concentrator E-Tanks;Home Concentrator E-Tanks;Home Concentrator  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 She will need a POC that delivers 5 liters pulse. In communication with MD regarding patients home oxygen needs. E-Tanks;Home Concentrator   Sleep Oxygen Prescription  - Continuous Continuous Continuous Continuous   Liters per minute  - _1 Home Exercise Oxygen Prescription  - Continuous Continuous Continuous Continuous   Liters per minute _2 Home at Rest Exercise Oxygen Prescription  - Continuous Continuous Continuous Continuous   Liters per minute 3 3  - 3 3   Compliance with Home Oxygen Use  - Yes Yes Yes Yes     Goals/Expected Outcomes   Short Term Goals  - To learn and exhibit compliance with exercise, home and travel O2 prescription;To learn and understand importance of monitoring SPO2 with pulse oximeter and demonstrate accurate use of the pulse oximeter.;To Learn and understand importance of maintaining oxygen saturations>88%;To learn and demonstrate proper purse lipped breathing techniques or other breathing techniques.;To learn and demonstrate proper use of respiratory medications To learn and exhibit compliance with exercise, home and travel O2 prescription;To learn and understand importance of monitoring SPO2 with pulse oximeter and demonstrate  accurate use of the pulse oximeter.;To Learn and understand importance of maintaining oxygen saturations>88%;To learn and demonstrate proper purse lipped breathing techniques or other breathing techniques.;To learn and demonstrate proper use of respiratory medications To learn and exhibit compliance with exercise, home and travel O2 prescription;To learn and understand importance of monitoring SPO2 with pulse oximeter and demonstrate accurate use of the pulse oximeter.;To Learn and understand importance of maintaining oxygen saturations>88%;To learn and demonstrate proper purse lipped breathing techniques or other breathing techniques.;To learn and demonstrate proper use of respiratory medications To learn and exhibit compliance with exercise, home and travel O2 prescription;To learn and understand importance of monitoring SPO2 with pulse oximeter and demonstrate accurate use of the pulse oximeter.;To Learn and understand importance of maintaining oxygen saturations>88%;To learn and demonstrate proper purse lipped breathing techniques or other breathing techniques.;To learn and demonstrate proper use of respiratory medications   Long  Term Goals  - Exhibits compliance with exercise, home and travel O2 prescription Exhibits compliance with exercise, home and travel O2 prescription Exhibits compliance with exercise, home and travel O2 prescription;Verbalizes importance of monitoring SPO2 with pulse oximeter and return demonstration;Maintenance of O2 saturations>88%;Exhibits proper breathing techniques, such as purse lipped breathing or other method taught during program session;Compliance with respiratory medication Exhibits compliance with exercise, home and travel O2 prescription;Verbalizes importance of monitoring SPO2 with pulse oximeter and return demonstration;Maintenance of O2 saturations>88%;Exhibits proper breathing techniques, such  as purse lipped breathing or other method taught during program  session;Compliance with respiratory medication   Comments  - [name] is doing well adapting to continuous use of oxygen. She verbalized compliance with checking her oxygen saturations at home and wearing her oxygen most of the time. She states she will take it off while sitting  while still maintaining saturations mid to high 90s. [name] is doing well adapting to continuous use of oxygen. She verbalized compliance with checking her oxygen saturations at home and wearing her oxygen most of the time. She states she will take it off while sitting  while still maintaining saturations mid to high 90s. Will continue to reinforse the importants of turning off her portable etank when not in use as she has left it own during rehab. [name] is doing well adapting to continuous use of oxygen. She verbalized compliance with checking her oxygen saturations at home and wearing her oxygen most of the time. She states she will take it off while sitting  while still maintaining saturations mid to high 90s. Will continue to reinforse the importants of turning off her portable etank when not in use as she has left it own during rehab. [name] is doing well adapting to continuous use of oxygen. She verbalized compliance with checking her oxygen saturations at home and wearing her oxygen most of the time. She states she will take it off while sitting  while still maintaining saturations mid to high 90s.    Goals/Expected Outcomes  - exhibits/verbalizes continued compliance with exercise, home, and travel O2 prescription exhibits/verbalizes continued compliance with exercise, home, and travel O2 prescription exhibits/verbalizes continued compliance with exercise, home, and travel O2 prescription exhibits/verbalizes continued compliance with exercise, home, and travel O2 prescription      Oxygen Discharge (Final Oxygen Re-Evaluation):     Oxygen Re-Evaluation - 12/05/16 1629      Program Oxygen Prescription   Program  Oxygen Prescription Continuous   Liters per minute 3     Home Oxygen   Home Oxygen Device E-Tanks;Home Concentrator   Sleep Oxygen Prescription Continuous   Liters per minute 2   Home Exercise Oxygen Prescription Continuous   Liters per minute 3   Home at Rest Exercise Oxygen Prescription Continuous   Liters per minute 3   Compliance with Home Oxygen Use Yes     Goals/Expected Outcomes   Short Term Goals To learn and exhibit compliance with exercise, home and travel O2 prescription;To learn and understand importance of monitoring SPO2 with pulse oximeter and demonstrate accurate use of the pulse oximeter.;To Learn and understand importance of maintaining oxygen saturations>88%;To learn and demonstrate proper purse lipped breathing techniques or other breathing techniques.;To learn and demonstrate proper use of respiratory medications   Long  Term Goals Exhibits compliance with exercise, home and travel O2 prescription;Verbalizes importance of monitoring SPO2 with pulse oximeter and return demonstration;Maintenance of O2 saturations>88%;Exhibits proper breathing techniques, such as purse lipped breathing or other method taught during program session;Compliance with respiratory medication   Comments [name] is doing well adapting to continuous use of oxygen. She verbalized compliance with checking her oxygen saturations at home and wearing her oxygen most of the time. She states she will take it off while sitting  while still maintaining saturations mid to high 90s.    Goals/Expected Outcomes exhibits/verbalizes continued compliance with exercise, home, and travel O2 prescription      Initial Exercise Prescription:     Initial Exercise Prescription - 07/21/16 1600  Date of Initial Exercise RX and Referring Provider   Date 07/14/16   Referring Provider Dr. Lake Bells     Oxygen   Oxygen Continuous   Liters 3     NuStep   Level 2   Minutes 17   METs 1.5     Arm Ergometer    Level 1   Minutes 17     Track   Laps 8   Minutes 17     Prescription Details   Frequency (times per week) 2   Duration Progress to 45 minutes of aerobic exercise without signs/symptoms of physical distress     Intensity   THRR 40-80% of Max Heartrate 58-115   Ratings of Perceived Exertion 11-13   Perceived Dyspnea 0-4     Progression   Progression Continue progressive overload as per policy without signs/symptoms or physical distress.     Resistance Training   Training Prescription Yes   Weight blue bands   Reps 10-12      Perform Capillary Blood Glucose checks as needed.  Exercise Prescription Changes:     Exercise Prescription Changes    Row Name 07/28/16 1240 08/02/16 1200 08/04/16 1200 08/11/16 1300 08/18/16 1300     Response to Exercise   Blood Pressure (Admit) 148/69 124/60 128/60 140/66 120/60   Blood Pressure (Exercise) 150/60 138/52 130/60 160/84 164/66   Blood Pressure (Exit) 140/62 126/60 112/60 124/80 124/66   Heart Rate (Admit) 95 bpm 84 bpm 90 bpm 85 bpm 99 bpm   Heart Rate (Exercise) 113 bpm 107 bpm 106 bpm 105 bpm 126 bpm   Heart Rate (Exit) 84 bpm 80 bpm 83 bpm 91 bpm 98 bpm   Oxygen Saturation (Admit) 98 % 100 % 98 % 98 % 98 %   Oxygen Saturation (Exercise) 95 % 93 % 93 % 93 % 88 %   Oxygen Saturation (Exit) 97 % 98 % 99 % 91 % 98 %   Rating of Perceived Exertion (Exercise) _0 Perceived Dyspnea (Exercise) _1 Duration Progress to 45 minutes of aerobic exercise without signs/symptoms of physical distress Progress to 45 minutes of aerobic exercise without signs/symptoms of physical distress Progress to 45 minutes of aerobic exercise without signs/symptoms of physical distress Progress to 45 minutes of aerobic exercise without signs/symptoms of physical distress Progress to 45 minutes of aerobic exercise without signs/symptoms of physical distress   Intensity Other (comment)  40-80 HRR THRR unchanged THRR unchanged THRR unchanged  THRR unchanged     Progression   Progression Continue to progress workloads to maintain intensity without signs/symptoms of physical distress. Continue to progress workloads to maintain intensity without signs/symptoms of physical distress. Continue to progress workloads to maintain intensity without signs/symptoms of physical distress. Continue to progress workloads to maintain intensity without signs/symptoms of physical distress. Continue to progress workloads to maintain intensity without signs/symptoms of physical distress.     Resistance Training   Training Prescription _2    Weight _3    Reps 10-12 10-15 10-15 10-15 10-15   Time  - 10 Minutes 10 Minutes 10 Minutes 10 Minutes     Interval Training   Interval Training  -  - No No No     Oxygen   Oxygen _4    Liters _5 Recumbant Bike   Level  -  _0 Minutes  - _1 NuStep   Level  - 2  -  -  -   Minutes  - 17  -  -  -   METs  - 1.7  -  -  -     Arm Ergometer   Level 1  -  -  -  -   Minutes 17  -  -  -  -     Track   Laps -  pt did not count steps _2 Minutes _3 Row Name 08/23/16 1229 08/25/16 1200 08/30/16 1239 09/01/16 1200 09/06/16 1200     Response to Exercise   Blood Pressure (Admit) 128/68 134/56 152/64 140/72 140/58   Blood Pressure (Exercise) 154/80 150/60 150/80 138/76 142/78   Blood Pressure (Exit) 104/60 116/78 142/90 124/60 138/78   Heart Rate (Admit) 92 bpm 80 bpm 97 bpm 86 bpm 87 bpm   Heart Rate (Exercise) 108 bpm 107 bpm 119 bpm 106 bpm 115 bpm   Heart Rate (Exit) 77 bpm 77 bpm 87 bpm 81 bpm 91 bpm   Oxygen Saturation (Admit) 98 % 99 % 99 % 96 % 98 %   Oxygen Saturation (Exercise) 92 % 94 % 96 % 94 % 94 %   Oxygen Saturation (Exit) 98 % 99 % 99 % 96 % 97 %   Rating of Perceived Exertion (Exercise) _4 Perceived Dyspnea  (Exercise) _5 Duration Progress to 45 minutes of aerobic exercise without signs/symptoms of physical distress Progress to 45 minutes of aerobic exercise without signs/symptoms of physical distress Progress to 45 minutes of aerobic exercise without signs/symptoms of physical distress Progress to 45 minutes of aerobic exercise without signs/symptoms of physical distress Progress to 45 minutes of aerobic exercise without signs/symptoms of physical distress   Intensity _6      Progression   Progression Continue to progress workloads to maintain intensity without signs/symptoms of physical distress. Continue to progress workloads to maintain intensity without signs/symptoms of physical distress. Continue to progress workloads to maintain intensity without signs/symptoms of physical distress. Continue to progress workloads to maintain intensity without signs/symptoms of physical distress. Continue to progress workloads to maintain intensity without signs/symptoms of physical distress.     Resistance Training   Training Prescription _7    Weight _8    Reps 10-15 10-15 10-15 10-15 10-15   Time 10 Minutes 10 Minutes 10 Minutes 10 Minutes 10 Minutes     Interval Training   Interval Training _9      Oxygen   Oxygen _10    Liters _11 Recumbant Bike   Level _12 Minutes _13 NuStep   Level 3  - _14 Minutes 17  - _15 METs 1.7  - 1.8 1.7 1.8     Track   Laps _16 - 11   Minutes _17 - 17   Row Name 09/08/16 1247 09/15/16 1226 09/20/16 1227 09/22/16 1200 09/29/16 1200     Response  to Exercise   Blood Pressure (Admit) 140/70 152/60 136/64 132/60 154/66   Blood Pressure (Exercise) 160/70 140/70 136/70 144/80 158/60   Blood Pressure (Exit)  124/60 110/60 138/68 124/64 114/66   Heart Rate (Admit) 82 bpm 109 bpm 100 bpm 91 bpm 88 bpm   Heart Rate (Exercise) 137 bpm 116 bpm 127 bpm 124 bpm 123 bpm   Heart Rate (Exit) 96 bpm 86 bpm 97 bpm 88 bpm 96 bpm   Oxygen Saturation (Admit) 98 % 96 % 98 % 96 % 93 %   Oxygen Saturation (Exercise) 90 % 94 % 89 % 90 % 91 %   Oxygen Saturation (Exit) 98 % 97 % 95 % 98 % 97 %   Rating of Perceived Exertion (Exercise) _0 Perceived Dyspnea (Exercise) _1 Duration Progress to 45 minutes of aerobic exercise without signs/symptoms of physical distress Progress to 45 minutes of aerobic exercise without signs/symptoms of physical distress Progress to 45 minutes of aerobic exercise without signs/symptoms of physical distress Progress to 45 minutes of aerobic exercise without signs/symptoms of physical distress Progress to 45 minutes of aerobic exercise without signs/symptoms of physical distress   Intensity _2      Progression   Progression Continue to progress workloads to maintain intensity without signs/symptoms of physical distress. Continue to progress workloads to maintain intensity without signs/symptoms of physical distress. Continue to progress workloads to maintain intensity without signs/symptoms of physical distress. Continue to progress workloads to maintain intensity without signs/symptoms of physical distress. Continue to progress workloads to maintain intensity without signs/symptoms of physical distress.     Resistance Training   Training Prescription _3    Weight _4    Reps 10-15 10-15 10-15 10-15 10-15   Time 10 Minutes 10 Minutes 10 Minutes 10 Minutes 10 Minutes     Interval Training   Interval Training _5      Oxygen   Oxygen _6    Liters _7 Recumbant  Bike   Level _8 - 3   Minutes _9 - 17     NuStep   Level  - _10 -   Minutes  - _11 -   METs  - 2.1 1.8 1.9  -     Track   Laps 14  - _12 Minutes 17  - _13 Row Name 10/06/16 1302 10/27/16 1300 11/01/16 1222 11/22/16 1200 11/24/16 1200     Response to Exercise   Blood Pressure (Admit) 130/66 160/70 140/66 134/58 130/70   Blood Pressure (Exercise) 148/68 166/80 160/68 142/60 144/60   Blood Pressure (Exit) 130/63 130/72 108/72 132/70 114/68   Heart Rate (Admit) 93 bpm 94 bpm 105 bpm 85 bpm 83 bpm   Heart Rate (Exercise) 113 bpm 108 bpm 134 bpm 106 bpm 106 bpm   Heart Rate (Exit) 89 bpm 93 bpm 86 bpm 80 bpm 82 bpm   Oxygen Saturation (Admit) 98 % 96 % 96 % 98 % 98 %   Oxygen Saturation (Exercise) 92 % 94 % 86 % 95 % 95 %   Oxygen Saturation (Exit) 97 % 96 % 98 % 98 % 100 %  Rating of Perceived Exertion (Exercise) _0 Perceived Dyspnea (Exercise) _1 Duration Progress to 45 minutes of aerobic exercise without signs/symptoms of physical distress Progress to 45 minutes of aerobic exercise without signs/symptoms of physical distress Progress to 45 minutes of aerobic exercise without signs/symptoms of physical distress Progress to 45 minutes of aerobic exercise without signs/symptoms of physical distress Progress to 45 minutes of aerobic exercise without signs/symptoms of physical distress   Intensity _2      Progression   Progression Continue to progress workloads to maintain intensity without signs/symptoms of physical distress. Continue to progress workloads to maintain intensity without signs/symptoms of physical distress. Continue to progress workloads to maintain intensity without signs/symptoms of physical distress. Continue to progress workloads to maintain intensity without signs/symptoms of physical distress. Continue to progress workloads to maintain intensity  without signs/symptoms of physical distress.     Resistance Training   Training Prescription _3    Weight _4    Reps 10-15 10-15 10-15 10-15 10-15   Time 10 Minutes 10 Minutes 10 Minutes 10 Minutes 10 Minutes     Interval Training   Interval Training _5      Oxygen   Oxygen Continuous  - Continuous  texted pt on pulse. desaturation noted on 5 liters Continuous Continuous   Liters 3  - _6 Recumbant Bike   Level  -  - _7 Minutes  -  - _8 NuStep   Level _9 Minutes _10 METs 1.8 1.9 1.7 1.7 1.8     Track   Laps _11 -   Minutes _12 -      Exercise Comments:   Exercise Goals and Review:   Exercise Goals Re-Evaluation :     Exercise Goals Re-Evaluation    Row Name 08/15/16 1606 09/13/16 0716 09/13/16 0719 11/07/16 0959       Exercise Goal Re-Evaluation   Exercise Goals Review Increase Strenth and Stamina;Increase Physical Activity Increase Physical Activity;Increase Strenth and Stamina  - Increase Physical Activity;Increase Strenth and Stamina    Comments Patient has only attended four exercise sessions. Has a hard time pushing herself. 1.7 MET average on Stepper. Will cont. to monitor and progress.  Patient is not making many gains in Pulmonary Rehab. Patient has been maintaining 11-14 laps (200 feet each) on track in 15 minutes since 07/28/16 (start of program). 1.7 METS on stepper  - Patient is not making gains in Pulmonary Rehab. Over the last 4 weeks she has only been attending one session a week. On average she works at a "fairly light" intensity. Patient is not exercising at home. Will cont. to monitor.     Expected Outcomes Through the exercise here at rehab the patient with increase physical capacity, strength, and stamina.  - Through the exercise here at rehab the patient with increase physical capacity, strength, and stamina.  Through the exercise here at rehab the patient with increase physical capacity, strength, and stamina.       Discharge Exercise Prescription (Final Exercise Prescription Changes):     Exercise Prescription Changes - 11/24/16 1200      Response  to Exercise   Blood Pressure (Admit) 130/70   Blood Pressure (Exercise) 144/60   Blood Pressure (Exit) 114/68   Heart Rate (Admit) 83 bpm   Heart Rate (Exercise) 106 bpm   Heart Rate (Exit) 82 bpm   Oxygen Saturation (Admit) 98 %   Oxygen Saturation (Exercise) 95 %   Oxygen Saturation (Exit) 100 %   Rating of Perceived Exertion (Exercise) 12   Perceived Dyspnea (Exercise) 1   Duration Progress to 45 minutes of aerobic exercise without signs/symptoms of physical distress   Intensity THRR unchanged     Progression   Progression Continue to progress workloads to maintain intensity without signs/symptoms of physical distress.     Resistance Training   Training Prescription Yes   Weight blue bands   Reps 10-15   Time 10 Minutes     Interval Training   Interval Training No     Oxygen   Oxygen Continuous   Liters 3     Recumbant Bike   Level 3   Minutes 17     NuStep   Level 4   Minutes 17   METs 1.8      Nutrition:  Target Goals: Understanding of nutrition guidelines, daily intake of sodium <1563m, cholesterol <2036m calories 30% from fat and 7% or less from saturated fats, daily to have 5 or more servings of fruits and vegetables.  Biometrics:     Pre Biometrics - 07/18/16 1339      Pre Biometrics   Grip Strength 33 kg       Nutrition Therapy Plan and Nutrition Goals:     Nutrition Therapy & Goals - 09/01/16 1323      Nutrition Therapy   Diet Therapeutic Lifestyle Changes     Personal Nutrition Goals   Nutrition Goal Maintain wt ~227 lb while in Pulmonary Rehab     Intervention Plan   Intervention Prescribe, educate and counsel regarding individualized specific dietary modifications aiming towards  targeted core components such as weight, hypertension, lipid management, diabetes, heart failure and other comorbidities.;Nutrition handout(s) given to patient.  Low Sodium Nutrition Therapy   Expected Outcomes Short Term Goal: Understand basic principles of dietary content, such as calories, fat, sodium, cholesterol and nutrients.;Long Term Goal: Adherence to prescribed nutrition plan.      Nutrition Discharge: Rate Your Plate Scores:     Nutrition Assessments - 09/01/16 1212      Rate Your Plate Scores   Pre Score 51      Nutrition Goals Re-Evaluation:   Nutrition Goals Discharge (Final Nutrition Goals Re-Evaluation):   Psychosocial: Target Goals: Acknowledge presence or absence of significant depression and/or stress, maximize coping skills, provide positive support system. Participant is able to verbalize types and ability to use techniques and skills needed for reducing stress and depression.  Initial Review & Psychosocial Screening:     Initial Psych Review & Screening - 07/18/16 1341      Initial Review   Current issues with --  none identified     Family Dynamics   Good Support System? Yes     Barriers   Psychosocial barriers to participate in program There are no identifiable barriers or psychosocial needs.     Screening Interventions   Interventions Encouraged to exercise      Quality of Life Scores:   PHQ-9: Recent Review Flowsheet Data    [facility] 02/16[facility]2015   Decreased Interest 0 0   Down, Depressed, Hopeless 0 0  PHQ - 2 Score 0 0     Interpretation of Total Score  Total Score Depression Severity:  1-4 = Minimal depression, 5-9 = Mild depression, 10-14 = Moderate depression, 15-19 = Moderately severe depression, 20-27 = Severe depression   Psychosocial Evaluation and Intervention:     Psychosocial Evaluation - 07/18/16 1342      Psychosocial Evaluation & Interventions   Interventions Encouraged to exercise with  the program and follow exercise prescription   Continue Psychosocial Services  No      Psychosocial Re-Evaluation:     Psychosocial Re-Evaluation    [place] Name 08/15/16 1019 08/15/16 1213 09/12/16 1211 10/10/16 1659 11/08/16 0807     Psychosocial Re-Evaluation   Current issues with None Identified  - None Identified None Identified Current Stress Concerns   Expected Outcomes  - patient will remain free from psycosocial barriers  patient will remain free from psycosocial barriers  patient will remain free from psycosocial barriers  patient will remain free from psycosocial barriers    Interventions Encouraged to attend Pulmonary Rehabilitation for the exercise  - Encouraged to attend Pulmonary Rehabilitation for the exercise Encouraged to attend Pulmonary Rehabilitation for the exercise Encouraged to attend Pulmonary Rehabilitation for the exercise   Continue Psychosocial Services  No Follow up required  - No Follow up required  - No Follow up required   Comments  -  -  -  - patient frustrated with continuous oxygen needs and having to manage oxygen while attempting to maintain an active lifestyle ie grocery shopping, dinners out     Initial Review   Source of Stress Concerns  -  -  -  - Chronic Illness   Row Name 12/05/16 1642             Psychosocial Re-Evaluation   Current issues with Current Stress Concerns       Comments She is less concerned about the progression of her disease now that she has had time to process the information.        Expected Outcomes patient will remain free from psycosocial barriers        Interventions Encouraged to attend Pulmonary Rehabilitation for the exercise       Continue Psychosocial Services  No Follow up required       Comments patient is more adjusted to her portable oxygen. There is still question if she is appropriate with a POC that does 5 liters pulse. These devices are larger and she would have to pull them behind her. She remains unsure if  this is what she wants or if she should stay with her small portable tank.          Psychosocial Discharge (Final Psychosocial Re-Evaluation):     Psychosocial Re-Evaluation - 12/05/16 1642      Psychosocial Re-Evaluation   Current issues with Current Stress Concerns   Comments She is less concerned about the progression of her disease now that she has had time to process the information.    Expected Outcomes patient will remain free from psycosocial barriers    Interventions Encouraged to attend Pulmonary Rehabilitation for the exercise   Continue Psychosocial Services  No Follow up required   Comments patient is more adjusted to her portable oxygen. There is still question if she is appropriate with a POC that does 5 liters pulse. These devices are larger and she would have to pull them behind her. She remains unsure if this is what she wants or if  she should stay with her small portable tank.      Education: Education Goals: Education classes will be provided on a weekly basis, covering required topics. Participant will state understanding/return demonstration of topics presented.  Learning Barriers/Preferences:     Learning Barriers/Preferences - 07/18/16 1337      Learning Barriers/Preferences   Learning Barriers None   Learning Preferences Audio;Group Instruction;Individual Instruction;Pictoral;Skilled Demonstration;Verbal Instruction      Education Topics: Risk Factor Reduction:  -Group instruction that is supported by a PowerPoint presentation. Instructor discusses the definition of a risk factor, different risk factors for pulmonary disease, and how the heart and lungs work together.     PULMONARY REHAB OTHER RESPIRATORY from 12/08/2016 in [place]  Date  08/25/16  Educator  EP  Instruction Review Code  2- meets goals/outcomes      Nutrition for Pulmonary Patient:  -Group instruction provided by PowerPoint slides, verbal discussion,  and written materials to support subject matter. The instructor gives an explanation and review of healthy diet recommendations, which includes a discussion on weight management, recommendations for fruit and vegetable consumption, as well as protein, fluid, caffeine, fiber, sodium, sugar, and alcohol. Tips for eating when patients are short of breath are discussed.   PULMONARY REHAB OTHER RESPIRATORY from 12/08/2016 in [place]  Date  11/24/16  Educator  RD  Instruction Review Code  R- Review/reinforce      Pursed Lip Breathing:  -Group instruction that is supported by demonstration and informational handouts. Instructor discusses the benefits of pursed lip and diaphragmatic breathing and detailed demonstration on how to preform both.     Oxygen Safety:  -Group instruction provided by PowerPoint, verbal discussion, and written material to support subject matter. There is an overview of "What is Oxygen" and "Why do we need it".  Instructor also reviews how to create a safe environment for oxygen use, the importance of using oxygen as prescribed, and the risks of noncompliance. There is a brief discussion on traveling with oxygen and resources the patient may utilize.   PULMONARY REHAB OTHER RESPIRATORY from 12/08/2016 in [place]  Date  09/08/16  Educator  Truddie Crumble  Instruction Review Code  2- meets goals/outcomes      Oxygen Equipment:  -Group instruction provided by [employer] Staff utilizing handouts, written materials, and equipment demonstrations.   PULMONARY REHAB OTHER RESPIRATORY from 12/08/2016 in [place]  Date  10/27/16  Educator  [facility]  Instruction Review Code  2- meets goals/outcomes      Signs and Symptoms:  -Group instruction provided by written material and verbal discussion to support subject matter. Warning signs and symptoms of infection, stroke, and heart attack  are reviewed and when to call the physician/911 reinforced. Tips for preventing the spread of infection discussed.   PULMONARY REHAB OTHER RESPIRATORY from 12/08/2016 in [place]  Date  10/06/16  Educator  rn  Instruction Review Code  2- meets goals/outcomes      Advanced Directives:  -Group instruction provided by verbal instruction and written material to support subject matter. Instructor reviews Advanced Directive laws and proper instruction for filling out document.   Pulmonary Video:  -Group video education that reviews the importance of medication and oxygen compliance, exercise, good nutrition, pulmonary hygiene, and pursed lip and diaphragmatic breathing for the pulmonary patient.   PULMONARY REHAB OTHER RESPIRATORY from 12/08/2016 in [place]  Date  09/15/16  Instruction Review Code  R- Review/reinforce      Exercise for the Pulmonary Patient:  -Group instruction that is supported by a PowerPoint presentation. Instructor discusses benefits of exercise, core components of exercise, frequency, duration, and intensity of an exercise routine, importance of utilizing pulse oximetry during exercise, safety while exercising, and options of places to exercise outside of rehab.     PULMONARY REHAB OTHER RESPIRATORY from 12/08/2016 in [place]  Date  09/22/16  Educator  EP  Instruction Review Code  2- meets goals/outcomes      Pulmonary Medications:  -Verbally interactive group education provided by instructor with focus on inhaled medications and proper administration.   PULMONARY REHAB OTHER RESPIRATORY from 12/08/2016 in [place]  Date  09/01/16  Educator  pharm  Instruction Review Code  2- meets goals/outcomes      Anatomy and Physiology of the Respiratory System and Intimacy:  -Group instruction provided by PowerPoint, verbal discussion, and  written material to support subject matter. Instructor reviews respiratory cycle and anatomical components of the respiratory system and their functions. Instructor also reviews differences in obstructive and restrictive respiratory diseases with examples of each. Intimacy, Sex, and Sexuality differences are reviewed with a discussion on how relationships can change when diagnosed with pulmonary disease. Common sexual concerns are reviewed.   PULMONARY REHAB OTHER RESPIRATORY from 12/08/2016 in [place]  Date  08/11/16  Educator  RN  Instruction Review Code  2- meets goals/outcomes      MD DAY -A group question and answer session with a medical doctor that allows participants to ask questions that relate to their pulmonary disease state.   PULMONARY REHAB OTHER RESPIRATORY from 12/08/2016 in [place]  Date  12/08/16  Educator  yacoub  Instruction Review Code  2- meets goals/outcomes      OTHER EDUCATION -Group or individual verbal, written, or video instructions that support the educational goals of the pulmonary rehab program.   Knowledge Questionnaire Score:   Core Components/Risk Factors/Patient Goals at Admission:     Personal Goals and Risk Factors at Admission - 07/18/16 1339      Core Components/Risk Factors/Patient Goals on Admission   Increase Strength and Stamina Yes   Improve shortness of breath with ADL's Yes   Develop more efficient breathing techniques such as purse lipped breathing and diaphragmatic breathing; and practicing self-pacing with activity Yes   Increase knowledge of respiratory medications and ability to use respiratory devices properly  Yes      Core Components/Risk Factors/Patient Goals Review:      Goals and Risk Factor Review    Row Name 07/18/16 1340 08/15/16 1009 09/12/16 1211 10/10/16 1658 11/08/16 0807     Core Components/Risk Factors/Patient Goals Review   Personal Goals  Review Increase Strength and Stamina;Improve shortness of breath with ADL's;Develop more efficient breathing techniques such as purse lipped breathing and diaphragmatic breathing and practicing self-pacing with activity.;Increase knowledge of respiratory medications and ability to use respiratory devices properly. Increase knowledge of respiratory medications and ability to use respiratory devices properly. Increase knowledge of respiratory medications and ability to use respiratory devices properly. Improve shortness of breath with ADL's;Develop more efficient breathing techniques such as purse lipped breathing and diaphragmatic breathing and practicing self-pacing with activity. Improve shortness of breath with ADL's;Develop more efficient breathing techniques such as purse lipped breathing and diaphragmatic breathing  and practicing self-pacing with activity.   Review  - see comments section in ITP see comments section in ITP see comments section in ITP see comments section in ITP   Expected Outcomes  - see expected outcomes on Admission ITP see expected outcomes on Admission ITP see expected outcomes on Admission ITP see expected outcomes on Admission ITP   Row Name 12/05/16 1640             Core Components/Risk Factors/Patient Goals Review   Personal Goals Review Improve shortness of breath with ADL's;Develop more efficient breathing techniques such as purse lipped breathing and diaphragmatic breathing and practicing self-pacing with activity.       Review patients attendance has been infrequent over the last 30 days. She has had an acute illness as well as had her cataracts removed. She verbalizes improved shortness of breath with ADLs however she is still not exercising at home on a regular basis. She still has to be reminded to PLB at times during her supervised exercise sessions in pulmonary rehab.       Expected Outcomes see expected outcomes on Admission ITP          Core Components/Risk  Factors/Patient Goals at Discharge (Final Review):      Goals and Risk Factor Review - 12/05/16 1640      Core Components/Risk Factors/Patient Goals Review   Personal Goals Review Improve shortness of breath with ADL's;Develop more efficient breathing techniques such as purse lipped breathing and diaphragmatic breathing and practicing self-pacing with activity.   Review patients attendance has been infrequent over the last 30 days. She has had an acute illness as well as had her cataracts removed. She verbalizes improved shortness of breath with ADLs however she is still not exercising at home on a regular basis. She still has to be reminded to PLB at times during her supervised exercise sessions in pulmonary rehab.   Expected Outcomes see expected outcomes on Admission ITP      ITP Comments:   Comments: ITP REVIEW Pt is no longer making expected progress toward pulmonary rehab goals after completing 20 sessions. She has missed multiple sessions over the last 30 days for medical reasons and appointments. She nolonger pushes herself while walking the track and is satisfied with walking 13-16 laps in 15 min. She is also not exercising at home. She will graduate the program next week as she is failing to progress.Recommend continued exercise, life style modification, education, and utilization of breathing techniques to increase stamina and strength and decrease shortness of breath with exertion.

## 2016-12-13 ENCOUNTER — Encounter (HOSPITAL_COMMUNITY)
Admission: RE | Admit: 2016-12-13 | Discharge: 2016-12-13 | Disposition: A | Discharge status: Home / Self Care | Payer: Medicare Other | Source: Ambulatory Visit | Attending: Pulmonary Disease | Admitting: Pulmonary Disease

## 2016-12-13 VITALS — Ht 67.0 in | Wt 223.5 lb

## 2016-12-13 DIAGNOSIS — J8410 Pulmonary fibrosis, unspecified: Secondary | ICD-10-CM

## 2016-12-13 DIAGNOSIS — J679 Hypersensitivity pneumonitis due to unspecified organic dust: Secondary | ICD-10-CM | POA: Diagnosis not present

## 2016-12-15 ENCOUNTER — Encounter (HOSPITAL_COMMUNITY): Payer: Medicare Other

## 2016-12-16 NOTE — Addendum Note (Signed)
Encounter addended by: Jewel Baize, RD on: 12/16/2016 10:08 AM<BR>    Actions taken: Flowsheet data copied forward, Visit Navigator Flowsheet section accepted

## 2016-12-20 ENCOUNTER — Encounter (HOSPITAL_COMMUNITY): Payer: Medicare Other

## 2016-12-22 ENCOUNTER — Encounter (HOSPITAL_COMMUNITY): Payer: Medicare Other

## 2016-12-26 DIAGNOSIS — E669 Obesity, unspecified: Secondary | ICD-10-CM | POA: Diagnosis not present

## 2016-12-26 DIAGNOSIS — J8410 Pulmonary fibrosis, unspecified: Secondary | ICD-10-CM | POA: Diagnosis not present

## 2016-12-26 DIAGNOSIS — J45909 Unspecified asthma, uncomplicated: Secondary | ICD-10-CM | POA: Diagnosis not present

## 2016-12-26 DIAGNOSIS — Z6835 Body mass index (BMI) 35.0-35.9, adult: Secondary | ICD-10-CM | POA: Diagnosis not present

## 2016-12-27 ENCOUNTER — Encounter (HOSPITAL_COMMUNITY): Payer: Medicare Other

## 2016-12-27 ENCOUNTER — Ambulatory Visit (INDEPENDENT_AMBULATORY_CARE_PROVIDER_SITE_OTHER): Payer: Medicare Other | Admitting: Pulmonary Disease

## 2016-12-27 ENCOUNTER — Encounter: Payer: Self-pay | Admitting: Pulmonary Disease

## 2016-12-27 VITALS — BP 144/68 | HR 79 | Ht 66.0 in | Wt 223.0 lb

## 2016-12-27 DIAGNOSIS — J679 Hypersensitivity pneumonitis due to unspecified organic dust: Secondary | ICD-10-CM

## 2016-12-27 DIAGNOSIS — J8410 Pulmonary fibrosis, unspecified: Secondary | ICD-10-CM | POA: Diagnosis not present

## 2016-12-27 DIAGNOSIS — J849 Interstitial pulmonary disease, unspecified: Secondary | ICD-10-CM

## 2016-12-27 DIAGNOSIS — J9621 Acute and chronic respiratory failure with hypoxia: Secondary | ICD-10-CM | POA: Diagnosis not present

## 2016-12-27 NOTE — Patient Instructions (Signed)
For your diffuse parenchymal lung disease, chronic hypersensitivity pneumonitis: Take the prednisone for a 6 week period as prescribed Follow-up with me in 8 weeks to let me know how it went  For your chronic respiratory failure with hypoxemia: Continue 3 L of oxygen continuously  For the wheezing when exposed to mold: For now, take albuterol prior to going to church However, if you have no benefit from the prednisone we will likely prescribe Pulmicort nebulized on the next visit  We will see back in 8 weeks or sooner if needed

## 2016-12-27 NOTE — Progress Notes (Signed)
Subjective:    Patient ID: [name], female    DOB: Jul 30, 1940, 76 y.o.   MRN: [id number]  Synopsis: Former patient of Dr. 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 She was seen at [facility] for a second opinion and they felt that she had hypersensitivity pneumonitis, chronic form.   HPI Chief Complaint  Patient presents with  . Follow-up    pt c/o stable sob with exertion, fatigue.     Vaughn says that she doesn't like the way her eyes feel after having her cataract surgery.  She says that she needs to see them this week sometime in the eye clinic.  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  She still hasn't hasn't had surgery for it yet.  She hasn't taken the prednisone yet because of her eye surgery.  She says that on Sunday she was in a moldy building and it made her wheeze a lot.    Past Medical History:  Diagnosis Date  . Allergic rhinitis    takes Claritin nightly  . Anxiety    but not on any meds  . Arthritis   . Asthma   . Carpal tunnel syndrome   . GERD (gastroesophageal reflux disease)   . History of bronchitis 3-86yr ago  . History of colon polyps    benign  . Joint pain   . Peripheral edema    takes Lasix daily  . Shortness of breath dyspnea    with exertion;Albuterol inhaler as needed  . Thyroid nodule       Review of Systems  Constitutional: Negative for chills, fatigue and fever.  HENT: Negative for postnasal drip, rhinorrhea and sinus pressure.   Respiratory: Negative for cough, shortness of breath and wheezing.   Cardiovascular: Negative for chest pain, palpitations and leg swelling.       Objective:   Physical Exam Vitals:   12/27/16 1547  BP: (!) 144/68  Pulse: 79  SpO2: 95%  Weight: 223 lb (101.2 kg)  Height: _0  (1.676 m)   3L pulse flow  Gen: well appearing HENT: OP  clear, TM's clear, neck supple PULM: Crackles bases B, normal percussion CV: RRR, no mgr, trace edema GI: BS+, soft, nontender Derm: no cyanosis or rash Psyche: normal mood and affect     CBC    Component Value Date/Time   WBC 10.9 (H) 06/18/2016 0507   RBC 4.26 06/18/2016 0507   HGB 12.5 06/18/2016 0507   HCT 37.9 06/18/2016 0507   PLT 433 (H) 06/18/2016 0507   MCV 89.0 06/18/2016 0507   MCH 29.3 06/18/2016 0507   MCHC 33.0 06/18/2016 0507   RDW 15.3 06/18/2016 0507   MONOABS 0.8 (H) 02/06/2007 1043   EOSABS 0.4 02/06/2007 1043   BASOSABS 0.1 02/06/2007 1043   Imaging: 06/2016 CT angiogram> no PE, superimposed ground glass changes in addition to baseline pulmonary fibrosis  Pulmonary function test: PFT's 2009:  FEV1 1.97 (84%),ratio 70, 22% increase FEV1 with BD, TLC 4.44 (82%), DLCO 16.7 (62%) 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  PFT's January 2016:  FVC 2.13 (66%), no obstruction, TLC 4.00 (72%), DLCO 13.70 (48%) March 2017 pulmonary function testing ratio 88%, FVC 2.03 L (84% predicted), total lung capacity 3.0 L (54%  predicted), DLCO 14.7 (51% predicted) April 2018 ratio 89%, FVC 1.91 L 61% predicted, total lung capacity 3.49 L 63% predicted, DLCO 12.69 44% predicted  6MW 02/2015 6 Min Walk: 1218 feet. The patient's lowest oxygen saturation was 90 %, highest heart rate was 118 bpm , and highest blood pressure was 180/80 07/2016 6 MW> 1320 feet 3 L O2 88%  Chest imaging: CT chest 2008:  Mild bronchiectasis, ?NSIP: CXR 05/2014:  Progressive IS changes compared to 2012.  HRCT 2016:  Changes c/w fibrotic NSIP?  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 is about the same today compared to her last visit. She has chronic hypersensitivity pneumonitis and chronic respiratory failure with hypoxemia, both of which are severe. We are waiting for a good window for her to try a trial of prednisone for 6 weeks to see if it makes a difference in her breathing and oxygenation. As a To them in the past and not overly optimistic that this will be effective but I think it's worth a shot.  That said, because of the worsening symptoms she has when she is exposed to mold, I think for now she should at least pretreat with albuterol prior to going to church (where she tends to be exposed to a lot of mold), and if in 8 weeks we find that there is no benefit to prednisone and then we may want to prescribe Pulmicort nebulized twice a day.  Plan: For your diffuse parenchymal lung disease, chronic hypersensitivity pneumonitis: Take the prednisone for a 6 week period as prescribed Follow-up with me in 8 weeks to let me know how it went  For your chronic respiratory failure with hypoxemia: Continue 3 L of oxygen continuously  For the wheezing when exposed to mold: For now, take albuterol prior to going to church However, if you have no benefit from the prednisone we will likely prescribe Pulmicort nebulized on the next visit  We will see back in 8 weeks or sooner if needed  > 50% of this 27 minute visit spent face to face  Current Outpatient Prescriptions:  .  albuterol (PROVENTIL HFA;VENTOLIN HFA) 108 (90 BASE) MCG/ACT inhaler, Inhale 2 puffs into the lungs every 6 (six) hours as needed for wheezing or shortness of breath. , Disp: , Rfl:  .  albuterol (PROVENTIL) (2.5 MG/3ML) 0.083% nebulizer solution, Take 3 mLs (2.5 mg total) by nebulization every 6 (six) hours as needed for wheezing or shortness of breath., Disp: 360 mL, Rfl: 5 .  aspirin 325 MG tablet, Take 650 mg by mouth every 6  (six) hours as needed for mild pain or moderate pain. , Disp: , Rfl:  .  benzonatate (TESSALON) 100 MG capsule, Take 1 capsule (100 mg total) by mouth every 8 (eight) hours as needed for cough., Disp: 45 capsule, Rfl: 1 .  fluticasone (FLONASE) 50 MCG/ACT nasal spray, Place 1 spray into both nostrils daily., Disp: , Rfl:  .  furosemide (LASIX) 20 MG tablet, Take 20 mg by mouth as needed for fluid. , Disp: , Rfl:  .  ibuprofen (ADVIL,MOTRIN) 200 MG tablet, Take 400 mg by mouth every 6 (six) hours as needed for mild pain or  moderate pain. , Disp: , Rfl:  .  loratadine (CLARITIN) 5 MG chewable tablet, Chew 5 mg by mouth daily as needed for allergies., Disp: , Rfl:

## 2017-01-17 ENCOUNTER — Encounter (HOSPITAL_COMMUNITY): Payer: Self-pay

## 2017-01-17 NOTE — Progress Notes (Signed)
Discharge Summary  Patient Details  Name: [name] MRN: [id number] Date of Birth: Sep 02, 1940 Referring Provider:     Pulmonary Rehab Walk Test from 07/21/2016 in [place]  Referring Provider  Dr. Lake Bells       Number of Visits: 21  Reason for Discharge:  Patient reached a stable level of exercise. Patient independent in their exercise.  Smoking History:  History  Smoking Status  . Never Smoker  Smokeless Tobacco  . Never Used    Diagnosis:  No diagnosis found.  ADL UCSD:     Pulmonary Assessment Scores    Row Name 12/13/16 1549         ADL UCSD   ADL Phase Exit     SOB Score total 41        Initial Exercise Prescription:     Initial Exercise Prescription - 07/21/16 1600      Date of Initial Exercise RX and Referring Provider   Date 07/14/16   Referring Provider Dr. Lake Bells     Oxygen   Oxygen Continuous   Liters 3     NuStep   Level 2   Minutes 17   METs 1.5     Arm Ergometer   Level 1   Minutes 17     Track   Laps 8   Minutes 17     Prescription Details   Frequency (times per week) 2   Duration Progress to 45 minutes of aerobic exercise without signs/symptoms of physical distress     Intensity   THRR 40-80% of Max Heartrate 58-115   Ratings of Perceived Exertion 11-13   Perceived Dyspnea 0-4     Progression   Progression Continue progressive overload as per policy without signs/symptoms or physical distress.     Resistance Training   Training Prescription Yes   Weight blue bands   Reps 10-12      Discharge Exercise Prescription (Final Exercise Prescription Changes):     Exercise Prescription Changes - 11/24/16 1200      Response to Exercise   Blood Pressure (Admit) 130/70   Blood Pressure (Exercise) 144/60   Blood Pressure (Exit) 114/68   Heart Rate (Admit) 83 bpm   Heart Rate (Exercise) 106 bpm   Heart Rate (Exit) 82 bpm   Oxygen Saturation (Admit) 98 %   Oxygen Saturation  (Exercise) 95 %   Oxygen Saturation (Exit) 100 %   Rating of Perceived Exertion (Exercise) 12   Perceived Dyspnea (Exercise) 1   Duration Progress to 45 minutes of aerobic exercise without signs/symptoms of physical distress   Intensity THRR unchanged     Progression   Progression Continue to progress workloads to maintain intensity without signs/symptoms of physical distress.     Resistance Training   Training Prescription Yes   Weight blue bands   Reps 10-15   Time 10 Minutes     Interval Training   Interval Training No     Oxygen   Oxygen Continuous   Liters 3     Recumbant Bike   Level 3   Minutes 17     NuStep   Level 4   Minutes 17   METs 1.8      Functional Capacity:     6 Minute Walk    Row Name 07/21/16 1622 12/13/16 1241       6 Minute Walk   Phase Initial Discharge    Distance 1320 feet 1342 feet  Distance % Change  - 1.67 %    Walk Time 6 minutes 6 minutes    # of Rest Breaks 0 0    MPH 2.5 2.54    METS 2.91 2.95    RPE 13 12    Perceived Dyspnea  3 1    Symptoms No No    Resting HR 86 bpm 79 bpm    Resting BP 130/70 140/66    Max Ex. HR 126 bpm 126 bpm    Max Ex. BP 182/66 148/80    2 Minute Post BP 138/80 142/76      Interval HR   Baseline HR 86 79    1 Minute HR 95 105    2 Minute HR 118 113    3 Minute HR 120 120    4 Minute HR 124 126    5 Minute HR 122 123    6 Minute HR 126 122    2 Minute Post HR 95 97    Interval Heart Rate? Yes Yes      Interval Oxygen   Interval Oxygen? Yes Yes    Baseline Oxygen Saturation % 95 % 97 %    Baseline Liters of Oxygen 3 L 3 L    1 Minute Oxygen Saturation % 91 % 95 %    1 Minute Liters of Oxygen 3 L 3 L    2 Minute Oxygen Saturation % 90 % 94 %    2 Minute Liters of Oxygen 3 L 3 L    3 Minute Oxygen Saturation % 90 % 91 %    3 Minute Liters of Oxygen 3 L 3 L    4 Minute Oxygen Saturation % 90 % 92 %    4 Minute Liters of Oxygen 3 L 3 L    5 Minute Oxygen Saturation % 88 % 93 %    5  Minute Liters of Oxygen 3 L 3 L    6 Minute Oxygen Saturation % 88 % 94 %    6 Minute Liters of Oxygen 3 L 3 L    2 Minute Post Oxygen Saturation % 98 % 98 %    2 Minute Post Liters of Oxygen 3 L 3 L       Psychological, QOL, Others - Outcomes: PHQ 2/9: Depression screen [facility] 2/9 12/13/2016 07/18/2016 02/16/2015  Decreased Interest 0 0 0  Down, Depressed, Hopeless 0 0 0  PHQ - 2 Score 0 0 0    Quality of Life:     Quality of Life - 12/13/16 1550      Quality of Life Scores   Health/Function Post 14.88 %   Socioeconomic Post 24.29 %   Psych/Spiritual Post 18.29 %   Family Post 30 %   GLOBAL Post 19.38 %      Personal Goals: Goals established at orientation with interventions provided to work toward goal.    Personal Goals Discharge:     Goals and Risk Factor Review    Row Name 08/15/16 1009 09/12/16 1211 10/10/16 1658 11/08/16 0807 12/05/16 1640     Core Components/Risk Factors/Patient Goals Review   Personal Goals Review Increase knowledge of respiratory medications and ability to use respiratory devices properly. Increase knowledge of respiratory medications and ability to use respiratory devices properly. Improve shortness of breath with ADL's;Develop more efficient breathing techniques such as purse lipped breathing and diaphragmatic breathing and practicing self-pacing with activity. Improve shortness of breath with ADL's;Develop more efficient breathing techniques such  as purse lipped breathing and diaphragmatic breathing and practicing self-pacing with activity. Improve shortness of breath with ADL's;Develop more efficient breathing techniques such as purse lipped breathing and diaphragmatic breathing and practicing self-pacing with activity.   Review see comments section in ITP see comments section in ITP see comments section in ITP see comments section in ITP patients attendance has been infrequent over the last 30 days. She has had an acute illness as well as had her  cataracts removed. She verbalizes improved shortness of breath with ADLs however she is still not exercising at home on a regular basis. She still has to be reminded to PLB at times during her supervised exercise sessions in pulmonary rehab.   Expected Outcomes see expected outcomes on Admission ITP see expected outcomes on Admission ITP see expected outcomes on Admission ITP see expected outcomes on Admission ITP see expected outcomes on Admission ITP      Nutrition & Weight - Outcomes:      Post Biometrics - 12/13/16 1243       Post  Biometrics   Height 5' 7" (1.702 m)   Weight 223 lb 8.7 oz (101.4 kg)   BMI (Calculated) 35.1   Grip Strength 30 kg      Nutrition:     Nutrition Therapy & Goals - 09/01/16 1323      Nutrition Therapy   Diet Therapeutic Lifestyle Changes     Personal Nutrition Goals   Nutrition Goal Maintain wt ~227 lb while in Pulmonary Rehab     Intervention Plan   Intervention Prescribe, educate and counsel regarding individualized specific dietary modifications aiming towards targeted core components such as weight, hypertension, lipid management, diabetes, heart failure and other comorbidities.;Nutrition handout(s) given to patient.  Low Sodium Nutrition Therapy   Expected Outcomes Short Term Goal: Understand basic principles of dietary content, such as calories, fat, sodium, cholesterol and nutrients.;Long Term Goal: Adherence to prescribed nutrition plan.      Nutrition Discharge:     Nutrition Assessments - 12/16/16 1004      Rate Your Plate Scores   Pre Score 51   Post Score 56      Education Questionnaire Score:     Knowledge Questionnaire Score - 12/13/16 1549      Knowledge Questionnaire Score   Post Score 12/13      Goals reviewed with patient; copy given to patient.

## 2017-01-27 ENCOUNTER — Telehealth: Payer: Self-pay | Admitting: Pulmonary Disease

## 2017-01-27 DIAGNOSIS — J849 Interstitial pulmonary disease, unspecified: Secondary | ICD-10-CM

## 2017-01-27 DIAGNOSIS — J479 Bronchiectasis, uncomplicated: Secondary | ICD-10-CM

## 2017-01-27 NOTE — Telephone Encounter (Signed)
Pt's current nebulizer is broken- took her neb to [facility], was told that insurance would pay for replacement machine if we place an order for one.  Order has been placed.  Nothing further needed.

## 2017-02-02 ENCOUNTER — Telehealth: Payer: Self-pay | Admitting: Pulmonary Disease

## 2017-02-02 MED ORDER — AMOXICILLIN 500 MG PO CAPS: 500.0000 mg | ORAL_CAPSULE | Freq: Three times a day (TID) | ORAL | 0 refills | Status: DC

## 2017-02-02 NOTE — Telephone Encounter (Signed)
LM x 1 

## 2017-02-02 NOTE — Telephone Encounter (Signed)
Called and spoke with pt. Pt reports of post nasal drip, prod cough with clear to yellow mucus & mild body aches  x2d Pt feels she may be developing sinusitis.  Denies fever, chills or sweats.  Pt is planning to travel to [facility] next week.  BQ please advise. Thanks.

## 2017-02-02 NOTE — Telephone Encounter (Signed)
Pt is aware of BQ's recommendations and voiced her understanding. Rx has been sent to preferred.  Pt also states she is scheduled for her flu vaccine tomorrow.    BQ please advise if okay to have or should pt reschedule. Thanks.

## 2017-02-02 NOTE — Telephone Encounter (Signed)
Hold off on flu shot for a week or two

## 2017-02-02 NOTE — Telephone Encounter (Signed)
Amoxicillin 549m po q8h x 5 days Drink plenty of fluids Call if no improvement

## 2017-02-03 ENCOUNTER — Ambulatory Visit: Payer: Self-pay

## 2017-02-03 NOTE — Telephone Encounter (Signed)
Will close encounter as nothing further is needed. Pt has been scheduled for flu vaccine on 02/23/17

## 2017-02-03 NOTE — Telephone Encounter (Signed)
Pt made aware to wait 1 to 2 weeks to have flu shot.  I put it in on her appointment for Sept 20.-tr

## 2017-02-23 ENCOUNTER — Ambulatory Visit (INDEPENDENT_AMBULATORY_CARE_PROVIDER_SITE_OTHER): Payer: Medicare Other | Admitting: Pulmonary Disease

## 2017-02-23 ENCOUNTER — Encounter: Payer: Self-pay | Admitting: Pulmonary Disease

## 2017-02-23 VITALS — BP 128/72 | HR 89 | Ht 67.0 in | Wt 228.0 lb

## 2017-02-23 DIAGNOSIS — J849 Interstitial pulmonary disease, unspecified: Secondary | ICD-10-CM

## 2017-02-23 DIAGNOSIS — Z23 Encounter for immunization: Secondary | ICD-10-CM | POA: Diagnosis not present

## 2017-02-23 MED ORDER — BUDESONIDE 0.5 MG/2ML IN SUSP: 0.5000 mg | Freq: Two times a day (BID) | RESPIRATORY_TRACT | 12 refills | Status: DC

## 2017-02-23 MED ORDER — AMOXICILLIN 500 MG PO TABS: 500.0000 mg | ORAL_TABLET | Freq: Two times a day (BID) | ORAL | 0 refills | Status: DC

## 2017-02-23 NOTE — Patient Instructions (Addendum)
For interstitial lung disease: We will check a 6 minute walk on the next visit We will start Pulmicort nebulized 0.5 mg twice a day no matter how you feel Use albuterol as needed for chest tightness wheezing or shortness of breath Flu shot today  For acute sinusitis: Take amoxicillin 500 mg twice a day for 10 days, take with a probiotic  For chronic respiratory failure with hypoxemia: Keep using 3 L of oxygen with exertion  We will see you back in 6-8 weeks or sooner if needed

## 2017-02-23 NOTE — Progress Notes (Signed)
Subjective:    Patient ID: [name], female    DOB: [birth year], 76 y.o.   MRN: [id number]  Synopsis: Former patient of Dr. 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   HPI Chief Complaint  Patient presents with  . Follow-up    Pt had developed an infection the end of August and was put on an abx. Pt c/o SOB, postnasal drip, and prod. cough with clear to yellow mucus.   She continues to cough.  She had some problem with an achy tooth and she developed some sinus problems which made the cough worse.  She took the amoxicillin we prescribed and this helped.    She says that when she took then she felt well.  She is bringing up yellow thick mucus again, mostly related to her sinus disease.    Prednisone helped with joint aches and she thinks it may have made a difference with the cough and shortness of breath to a lesser degree.    Past Medical History:  Diagnosis Date  . Allergic rhinitis    takes Claritin nightly  . Anxiety    but not on any meds  . Arthritis   . Asthma   . Carpal tunnel syndrome   . GERD (gastroesophageal reflux disease)   . History of bronchitis 3-35yr ago  . History of colon polyps    benign  . Joint pain   . Peripheral edema    takes Lasix daily  . Shortness of breath dyspnea    with exertion;Albuterol inhaler as needed  . Thyroid nodule       Review of Systems  Constitutional: Negative for chills, fatigue and fever.  HENT: Negative for postnasal drip, rhinorrhea and sinus pressure.   Respiratory: Negative for cough, shortness of breath and wheezing.   Cardiovascular: Negative for chest pain, palpitations and leg swelling.       Objective:   Physical Exam Vitals:   02/23/17 1448  BP: 128/72  Pulse: 89  SpO2: 98%  Weight: 228  lb (103.4 kg)  Height: _0  (1.702 m)   3L pulse flow  Gen: well appearing HENT: OP clear, TM's clear, neck supple PULM: Crackles/wheezes bilaterally throughout, normal percussion CV: RRR, systolic murmur,  trace edema GI: BS+, soft, nontender Derm: no cyanosis or rash Psyche: normal mood and affect      CBC    Component Value Date/Time   WBC 10.9 (H) 06/18/2016 0507   RBC 4.26 06/18/2016 0507   HGB 12.5 06/18/2016 0507   HCT 37.9 06/18/2016 0507   PLT 433 (H) 06/18/2016 0507   MCV 89.0 06/18/2016 0507   MCH 29.3 06/18/2016 0507   MCHC 33.0 06/18/2016 0507   RDW 15.3 06/18/2016 0507   MONOABS 0.8 (H) 02/06/2007 1043   EOSABS 0.4 02/06/2007 1043   BASOSABS 0.1 02/06/2007 1043   Imaging: 06/2016 CT angiogram> no PE, superimposed ground glass changes in addition to baseline pulmonary fibrosis  Pulmonary function test: PFT's 2009:  FEV1 1.97 (84%),ratio 70, 22% increase FEV1 with BD, TLC 4.44 (82%), DLCO 16.7 (62%) 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  PFT's January 2016:  FVC 2.13 (66%), no obstruction, TLC 4.00 (72%), DLCO 13.70 (48%) March 2017 pulmonary  function testing ratio 88%, FVC 2.03 L (84% predicted), total lung capacity 3.0 L (54% predicted), DLCO 14.7 (51% predicted) April 2018 ratio 89%, FVC 1.91 L 61% predicted, total lung capacity 3.49 L 63% predicted, DLCO 12.69 44% predicted  6MW 02/2015 6 Min Walk: 1218 feet. The patient's lowest oxygen saturation was 90 %, highest heart rate was 118 bpm , and highest blood pressure was 180/80 07/2016 6 MW> 1320 feet 3 L O2 88%  Chest imaging: CT chest 2008:  Mild bronchiectasis, ?NSIP: CXR 05/2014:  Progressive IS changes compared to 2012.  HRCT 2016:  Changes c/w fibrotic NSIP?  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 daughter he. She had significant improvement in her arthritis symptoms while taking prednisone and perhaps some mild to moderate response in terms of cough and shortness of breath with the prednisone. Moving forward I think that systemic steroids carry too much risk of osteoporosis and other abnormalities but it's worth trying inhaled corticosteroids to see if this helps.  From a chronic respiratory failure with hypoxemia standpoint things seem to be stable. She needs to have another 6 minute walk test.  She continues to struggle with acute bacterial maxillary sinusitis and she had a tooth fracture recently. We will give her another round of amoxicillin today.  Plan: For interstitial lung disease: We will check a 6 minute walk on the next visit We will start Pulmicort nebulized 0.5 mg twice a day no matter how you feel Use albuterol as needed for chest tightness wheezing or shortness of breath Flu shot today  For acute sinusitis: Take amoxicillin 500 mg twice a day for 10 days, take with a probiotic  For chronic respiratory failure with hypoxemia: Keep using 3 L of oxygen with exertion  We will see you back in 6-8 weeks or sooner if needed   Current Outpatient Prescriptions:  .  albuterol (PROVENTIL HFA;VENTOLIN HFA) 108 (90 BASE) MCG/ACT inhaler, Inhale 2 puffs into the lungs every 6 (six) hours as needed for wheezing or shortness of breath. , Disp: , Rfl:  .  albuterol (PROVENTIL) (2.5 MG/3ML) 0.083% nebulizer solution, Take 3 mLs (2.5 mg total) by nebulization every 6 (six) hours as needed for wheezing or shortness of breath., Disp: 360 mL, Rfl: 5 .  aspirin 325 MG tablet, Take 650 mg by mouth every 6 (six) hours as needed for mild pain or moderate pain. , Disp: , Rfl:  .  fluticasone (FLONASE) 50  MCG/ACT nasal spray, Place 1 spray into both nostrils daily., Disp: , Rfl:  .  furosemide (LASIX) 20 MG tablet, Take 20 mg by mouth as needed for fluid. , Disp: , Rfl:  .  ibuprofen (ADVIL,MOTRIN) 200 MG tablet, Take 400 mg by mouth every 6 (six) hours as needed for mild pain or moderate pain. , Disp: , Rfl:  .  loratadine (CLARITIN) 5 MG chewable tablet, Chew 5 mg by mouth daily as needed for allergies., Disp: , Rfl:  .  benzonatate (TESSALON) 100 MG capsule, Take 1 capsule (100 mg total) by mouth every 8 (eight) hours as needed for cough. (Patient not taking: Reported on 02/23/2017), Disp: 45 capsule, Rfl: 1

## 2017-04-19 ENCOUNTER — Ambulatory Visit (INDEPENDENT_AMBULATORY_CARE_PROVIDER_SITE_OTHER): Payer: Medicare Other | Admitting: Pulmonary Disease

## 2017-04-19 ENCOUNTER — Ambulatory Visit (INDEPENDENT_AMBULATORY_CARE_PROVIDER_SITE_OTHER): Payer: Medicare Other | Admitting: *Deleted

## 2017-04-19 ENCOUNTER — Other Ambulatory Visit (INDEPENDENT_AMBULATORY_CARE_PROVIDER_SITE_OTHER): Payer: Medicare Other

## 2017-04-19 ENCOUNTER — Encounter: Payer: Self-pay | Admitting: Pulmonary Disease

## 2017-04-19 ENCOUNTER — Telehealth: Payer: Self-pay | Admitting: Pulmonary Disease

## 2017-04-19 ENCOUNTER — Ambulatory Visit (INDEPENDENT_AMBULATORY_CARE_PROVIDER_SITE_OTHER)
Admission: RE | Admit: 2017-04-19 | Discharge: 2017-04-19 | Disposition: A | Discharge status: Home / Self Care | Payer: Medicare Other | Source: Ambulatory Visit | Attending: Pulmonary Disease | Admitting: Pulmonary Disease

## 2017-04-19 VITALS — BP 134/82 | HR 89 | Ht 67.0 in | Wt 221.0 lb

## 2017-04-19 DIAGNOSIS — J8410 Pulmonary fibrosis, unspecified: Secondary | ICD-10-CM | POA: Diagnosis not present

## 2017-04-19 DIAGNOSIS — J479 Bronchiectasis, uncomplicated: Secondary | ICD-10-CM

## 2017-04-19 DIAGNOSIS — R0600 Dyspnea, unspecified: Secondary | ICD-10-CM

## 2017-04-19 DIAGNOSIS — R0602 Shortness of breath: Secondary | ICD-10-CM

## 2017-04-19 DIAGNOSIS — J849 Interstitial pulmonary disease, unspecified: Secondary | ICD-10-CM

## 2017-04-19 LAB — CBC WITH DIFFERENTIAL/PLATELET
Basophils Absolute: 0.1 10*3/uL (ref 0.0–0.1)
Basophils Relative: 1.5 % (ref 0.0–3.0)
Eosinophils Absolute: 0.7 10*3/uL (ref 0.0–0.7)
Eosinophils Relative: 6.6 % — ABNORMAL HIGH (ref 0.0–5.0)
HCT: 41 % (ref 36.0–46.0)
Hemoglobin: 13.3 g/dL (ref 12.0–15.0)
Lymphocytes Relative: 34.5 % (ref 12.0–46.0)
Lymphs Abs: 3.5 10*3/uL (ref 0.7–4.0)
MCHC: 32.4 g/dL (ref 30.0–36.0)
MCV: 90.7 fl (ref 78.0–100.0)
Monocytes Absolute: 0.9 10*3/uL (ref 0.1–1.0)
Monocytes Relative: 8.6 % (ref 3.0–12.0)
Neutro Abs: 5 10*3/uL (ref 1.4–7.7)
Neutrophils Relative %: 48.8 % (ref 43.0–77.0)
Platelets: 377 10*3/uL (ref 150.0–400.0)
RBC: 4.52 Mil/uL (ref 3.87–5.11)
RDW: 14 % (ref 11.5–15.5)
WBC: 10.2 10*3/uL (ref 4.0–10.5)

## 2017-04-19 LAB — BRAIN NATRIURETIC PEPTIDE: Pro B Natriuretic peptide (BNP): 22 pg/mL (ref 0.0–100.0)

## 2017-04-19 MED ORDER — PREDNISONE 20 MG PO TABS: 20.0000 mg | ORAL_TABLET | Freq: Every day | ORAL | 0 refills | Status: DC

## 2017-04-19 MED ORDER — DOXYCYCLINE HYCLATE 100 MG PO TABS: 100.0000 mg | ORAL_TABLET | Freq: Two times a day (BID) | ORAL | 0 refills | Status: DC

## 2017-04-19 NOTE — Progress Notes (Signed)
SIX MIN WALK 04/19/2017  Medications Claritin 20m @ 1000, Budesonide neb @ 1400  Supplimental Oxygen during Test? (L/min) Yes  O2 Flow Rate 3  Type Pulse  Laps 5  Partial Lap (in Meters) 13  Baseline BP (sitting) 144/82  Baseline Heartrate 86  Baseline Dyspnea (Borg Scale) 4  Baseline Fatigue (Borg Scale) 2  Baseline SPO2 98  BP (sitting) 182/88  Heartrate 122  Dyspnea (Borg Scale) 4  Fatigue (Borg Scale) 2  SPO2 90  BP (sitting) 160/86  SPO2 97  Stopped or Paused before Six Minutes Yes  Other Symptoms at end of Exercise timer stopped with 249m 15sec left due to increased SOB and sats were recorded at 86% on 3lpm pulsed.  pt was increased 4lpm pulsed and allowed to rest until sats recovered to 97% on 4lpm pulsed.  Dyspnea on Borg scale = 5  Distance Completed 253  Tech Comments: slow pace, finger pulse-ox used for the test.  pt stopped at 72m36m15sec due to shortness of breath with 4 full laps completed at time of rest.

## 2017-04-19 NOTE — Progress Notes (Signed)
Subjective:    Patient ID: [name], female    DOB: August 13, 1940, 76 y.o.   MRN: [id number]  Synopsis: Former patient of Dr. 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   HPI Chief Complaint  Patient presents with  . Interstitial Lung Disease    Breathing is worse since last OV. Reports increased SOB and cough. Denies chest tightness or wheezing.   Dalayah says she is not doing well.  In the last few weeks she was coughing up more yellow mucus, maybe from the lungs, maybe from her nose.  She is coughing some now, still feels like she has a weak voice.  She says that she feels generally weak.  She is trying to stay occupied.  She hasn't left the house.  No fever or chills.  Yolanda Bonine was sick recently.  She has minimal leg swelling . She is eating salt, doesn't watch her fluid intake too much.  She takes diuretics prn.   Past Medical History:  Diagnosis Date  . Allergic rhinitis    takes Claritin nightly  . Anxiety    but not on any meds  . Arthritis   . Asthma   . Carpal tunnel syndrome   . GERD (gastroesophageal reflux disease)   . History of bronchitis 3-31yr ago  . History of colon polyps    benign  . Joint pain   . Peripheral edema    takes Lasix daily  . Shortness of breath dyspnea    with exertion;Albuterol inhaler as needed  . Thyroid nodule       Review of Systems  Constitutional: Negative for chills, fatigue and fever.  HENT: Negative for postnasal drip, rhinorrhea and sinus pressure.   Respiratory: Negative for cough, shortness of breath and wheezing.   Cardiovascular: Negative for chest pain, palpitations and leg swelling.       Objective:   Physical Exam Vitals:   04/19/17 1443 04/19/17 1444  BP:  134/82  Pulse:  89  SpO2:  96%  Weight: 221  lb (100.2 kg)   Height: _0  (1.702 m)    3L pulse flow  Gen: well appearing HENT: OP clear, TM's clear, neck supple PULM: Crackles bilaterally all over, normal percussion CV: RRR, no mgr, trace edema GI: BS+, soft, nontender Derm: no cyanosis or rash Psyche: normal mood and affect     CBC    Component Value Date/Time   WBC 10.9 (H) 06/18/2016 0507   RBC 4.26 06/18/2016 0507   HGB 12.5 06/18/2016 0507   HCT 37.9 06/18/2016 0507   PLT 433 (H) 06/18/2016 0507   MCV 89.0 06/18/2016 0507   MCH 29.3 06/18/2016 0507   MCHC 33.0 06/18/2016 0507   RDW 15.3 06/18/2016 0507   MONOABS 0.8 (H) 02/06/2007 1043   EOSABS 0.4 02/06/2007 1043   BASOSABS 0.1 02/06/2007 1043   Imaging: 06/2016 CT angiogram> no PE, superimposed ground glass changes in addition to baseline pulmonary fibrosis  Pulmonary function test: PFT's 2009:  FEV1 1.97 (84%),ratio 70, 22% increase FEV1 with BD, TLC 4.44 (82%), DLCO 16.7 (62%) 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  PFT's January 2016:  FVC 2.13 (66%), no obstruction, TLC 4.00 (72%), DLCO 13.70 (48%) March 2017  pulmonary function testing ratio 88%, FVC 2.03 L (84% predicted), total lung capacity 3.0 L (54% predicted), DLCO 14.7 (51% predicted) April 2018 ratio 89%, FVC 1.91 L 61% predicted, total lung capacity 3.49 L 63% predicted, DLCO 12.69 44% predicted  6MW 02/2015 6 Min Walk: 1218 feet. The patient's lowest oxygen saturation was 90 %, highest heart rate was 118 bpm , and highest blood pressure was 180/80 07/2016 6 MW> 1320 feet 3 L O2 88% November 2018 > 253 m  Chest imaging: CT chest 2008:  Mild bronchiectasis, ?NSIP: CXR 05/2014:  Progressive IS changes compared to 2012.  HRCT 2016:  Changes c/w fibrotic NSIP?  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 to artery.  Specifically, she is feeling worse, coughing more, her 6-minute walk distance is down, and she is feeling more short of breath.  Hopefully this is just a case of acute bronchitis, however I worry that this is related to her underlying interstitial lung disease.  I am going to treat her as if this is an acute flare of bronchitis with doxycycline and I will also give a brief course of prednisone.  I have asked her to use more oxygen when she exerts herself.  After completing this round of treatment she will need to have a objective assessment of her interstitial lung disease with test and CT scan.  At that point we will talk prognosis.  Plan: Interstitial lung disease: Lung function test and high-resolution CT scan prior to next visit  Dyspnea: Check CBC, CXR, BNP  Cough with mucus production: Likely acute bronchitis Take prednisone 20 mg daily times 5 days Take doxycycline 100 mg twice a day times 5 days  Chronic respiratory failure with hypoxemia: 2 L of oxygen at rest, 4 L with exertion  Follow-up with me in 2 weeks   Current Outpatient Medications:  .  albuterol (PROVENTIL HFA;VENTOLIN HFA) 108 (90 BASE) MCG/ACT inhaler, Inhale 2 puffs into the lungs every 6 (six) hours as needed for wheezing or shortness of breath. , Disp: , Rfl:  .  albuterol (PROVENTIL) (2.5 MG/3ML) 0.083% nebulizer solution, Take 3 mLs (2.5 mg total) by nebulization every 6 (six) hours as needed for wheezing or shortness of breath., Disp: 360 mL, Rfl: 5 .  aspirin 325 MG tablet, Take 650 mg by mouth every 6 (six) hours as needed for  mild pain or moderate pain. , Disp: , Rfl:  .  budesonide (PULMICORT) 0.5 MG/2ML nebulizer solution, Take 2 mLs (0.5 mg total) by nebulization 2 (two) times daily., Disp: 120 mL, Rfl: 12 .  fluticasone (FLONASE) 50 MCG/ACT nasal spray, Place 1 spray into both nostrils daily., Disp: , Rfl:  .  furosemide (LASIX) 20 MG tablet, Take 20 mg by mouth as needed for fluid. , Disp: , Rfl:  .  ibuprofen (ADVIL,MOTRIN) 200 MG tablet, Take 400 mg by mouth every 6 (six) hours as needed for mild pain or moderate pain. , Disp: , Rfl:  .  loratadine (CLARITIN) 5 MG chewable tablet, Chew 5  mg by mouth daily as needed for allergies., Disp: , Rfl:

## 2017-04-19 NOTE — Patient Instructions (Signed)
Interstitial lung disease: Lung function test and high-resolution CT scan prior to next visit  Cough with mucus production: Likely acute bronchitis Take prednisone 20 mg daily times 5 days Take doxycycline 100 mg twice a day times 5 days  Chronic respiratory failure with hypoxemia: 2 L of oxygen at rest, 4 L with exertion  Follow-up with me in 2 weeks

## 2017-04-20 NOTE — Telephone Encounter (Signed)
Notes recorded by Juanito Doom, MD on 04/20/2017 at 8:18 AM EST A, Please let the patient know this was OK, didn't show any new findings like pneumonia Thanks, B  Pt is aware of results and voiced her understanding.  Nothing further needed.

## 2017-04-24 ENCOUNTER — Ambulatory Visit (INDEPENDENT_AMBULATORY_CARE_PROVIDER_SITE_OTHER)
Admission: RE | Admit: 2017-04-24 | Discharge: 2017-04-24 | Disposition: A | Discharge status: Home / Self Care | Payer: Medicare Other | Source: Ambulatory Visit | Attending: Pulmonary Disease | Admitting: Pulmonary Disease

## 2017-04-24 DIAGNOSIS — R05 Cough: Secondary | ICD-10-CM | POA: Diagnosis not present

## 2017-04-24 DIAGNOSIS — J849 Interstitial pulmonary disease, unspecified: Secondary | ICD-10-CM | POA: Diagnosis not present

## 2017-05-01 ENCOUNTER — Ambulatory Visit (INDEPENDENT_AMBULATORY_CARE_PROVIDER_SITE_OTHER): Payer: Medicare Other | Admitting: Pulmonary Disease

## 2017-05-01 DIAGNOSIS — J849 Interstitial pulmonary disease, unspecified: Secondary | ICD-10-CM | POA: Diagnosis not present

## 2017-05-01 DIAGNOSIS — R0600 Dyspnea, unspecified: Secondary | ICD-10-CM

## 2017-05-01 LAB — PULMONARY FUNCTION TEST
DL/VA % pred: 70 %
DL/VA: 3.62 ml/min/mmHg/L
DLCO cor % pred: 37 %
DLCO cor: 10.63 ml/min/mmHg
DLCO unc % pred: 39 %
DLCO unc: 11.23 ml/min/mmHg
FEF 25-75 Post: 1.98 L/sec
FEF 25-75 Pre: 1.99 L/sec
FEF2575-%Change-Post: 0 %
FEF2575-%Pred-Post: 115 %
FEF2575-%Pred-Pre: 116 %
FEV1-%Change-Post: 4 %
FEV1-%Pred-Post: 67 %
FEV1-%Pred-Pre: 64 %
FEV1-Post: 1.56 L
FEV1-Pre: 1.5 L
FEV1FVC-%Change-Post: 3 %
FEV1FVC-%Pred-Pre: 118 %
FEV6-%Change-Post: 0 %
FEV6-%Pred-Post: 58 %
FEV6-%Pred-Pre: 58 %
FEV6-Post: 1.72 L
FEV6-Pre: 1.71 L
FEV6FVC-%Pred-Post: 105 %
FEV6FVC-%Pred-Pre: 105 %
FVC-%Change-Post: 0 %
FVC-%Pred-Post: 55 %
FVC-%Pred-Pre: 55 %
FVC-Post: 1.72 L
FVC-Pre: 1.71 L
Post FEV1/FVC ratio: 90 %
Post FEV6/FVC ratio: 100 %
Pre FEV1/FVC ratio: 87 %
Pre FEV6/FVC Ratio: 100 %
RV % pred: 63 %
RV: 1.59 L
TLC % pred: 61 %
TLC: 3.37 L

## 2017-05-01 NOTE — Progress Notes (Signed)
PFT done today by Desmond Dike, CMA

## 2017-05-04 ENCOUNTER — Other Ambulatory Visit: Payer: Self-pay | Admitting: Pulmonary Disease

## 2017-05-04 ENCOUNTER — Encounter: Payer: Self-pay | Admitting: Pulmonary Disease

## 2017-05-04 ENCOUNTER — Ambulatory Visit (INDEPENDENT_AMBULATORY_CARE_PROVIDER_SITE_OTHER): Payer: Medicare Other | Admitting: Pulmonary Disease

## 2017-05-04 VITALS — BP 132/62 | HR 80 | Ht 67.0 in | Wt 224.0 lb

## 2017-05-04 DIAGNOSIS — J849 Interstitial pulmonary disease, unspecified: Secondary | ICD-10-CM

## 2017-05-04 DIAGNOSIS — J9611 Chronic respiratory failure with hypoxia: Secondary | ICD-10-CM | POA: Diagnosis not present

## 2017-05-04 DIAGNOSIS — J679 Hypersensitivity pneumonitis due to unspecified organic dust: Secondary | ICD-10-CM | POA: Diagnosis not present

## 2017-05-04 DIAGNOSIS — Z5181 Encounter for therapeutic drug level monitoring: Secondary | ICD-10-CM | POA: Diagnosis not present

## 2017-05-04 MED ORDER — FUROSEMIDE 40 MG PO TABS: 40.0000 mg | ORAL_TABLET | Freq: Every day | ORAL | 0 refills | Status: DC

## 2017-05-04 NOTE — Progress Notes (Signed)
Subjective:    Patient ID: [name], female    DOB: 04/28/1941, 76 y.o.   MRN: [id number]  Synopsis: Former patient of Dr. 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 She was seen at [facility] for a second opinion and they felt that she had hypersensitivity pneumonitis, chronic form.   HPI Chief Complaint  Patient presents with  . Follow-up    2 week follow up, still feels sob, tired, feels her breathing is not where it should be    [name] says that she felt a little better after taking the prednisone and doxycycline.  She had a bump in her energy level.  She still feels dyspnea and cough.  She says the breathing treatments are helpful.   She has chronic leg swelling and takes lasix from time to time.  She maybe takes lasix 1-2 times per week.  Lasix makes her go about 2 hours later.  Her goal weight is about 215. She has been running her weight around 219 lately.  This varies with her diet.    Past Medical History:  Diagnosis Date  . Allergic rhinitis    takes Claritin nightly  . Anxiety    but not on any meds  . Arthritis   . Asthma   . Carpal tunnel syndrome   . GERD (gastroesophageal reflux disease)   . History of bronchitis 3-15yr ago  . History of colon polyps    benign  . Joint pain   . Peripheral edema    takes Lasix daily  . Shortness of breath dyspnea    with exertion;Albuterol inhaler as needed  . Thyroid nodule       Review of Systems  Constitutional: Negative for chills, fatigue and fever.  HENT: Negative for postnasal drip, rhinorrhea and sinus pressure.   Respiratory: Negative for cough, shortness of breath and wheezing.   Cardiovascular: Negative for chest pain, palpitations and leg swelling.       Objective:   Physical Exam Vitals:   05/04/17 0935  BP: 132/62  Pulse: 80  SpO2: 100%  Weight: 224 lb (101.6 kg)  Height: _0  (1.702 m)   3L  pulse flow  Gen: chronically ill appearing HENT: OP clear, TM's clear, neck supple PULM: Crackles throughout B, normal percussion CV: RRR, no mgr, pitting edema legs GI: BS+, soft, nontender Derm: no cyanosis or rash Psyche: normal mood and affect       CBC    Component Value Date/Time   WBC 10.2 04/19/2017 1617   RBC 4.52 04/19/2017 1617   HGB 13.3 04/19/2017 1617   HCT 41.0 04/19/2017 1617   PLT 377.0 04/19/2017 1617   MCV 90.7 04/19/2017 1617   MCH 29.3 06/18/2016 0507   MCHC 32.4 04/19/2017 1617   RDW 14.0 04/19/2017 1617   LYMPHSABS 3.5 04/19/2017 1617   MONOABS 0.9 04/19/2017 1617   EOSABS 0.7 04/19/2017 1617   BASOSABS 0.1 04/19/2017 1617   Imaging: 06/2016 CT angiogram> no PE, superimposed ground glass changes in addition to baseline pulmonary fibrosis  Pulmonary function test: PFT's 2009:  FEV1 1.97 (84%),ratio 70, 22% increase FEV1 with BD, TLC 4.44 (82%), DLCO 16.7 (62%) 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  PFT's January 2016:  FVC 2.13 (66%), no obstruction, TLC 4.00 (72%), DLCO 13.70 (48%) March 2017 pulmonary function testing ratio  88%, FVC 2.03 L (84% predicted), total lung capacity 3.0 L (54% predicted), DLCO 14.7 (51% predicted) April 2018 ratio 89%, FVC 1.91 L 61% predicted, total lung capacity 3.49 L 63% predicted, DLCO 12.69 44% predicted November 2018 pulmonary function testing ratio 90%, FVC 1.72 L 55% predicted, total lung capacity 3.37 L 61% predicted, DLCO 11.2 39% predicted  6MW 02/2015 6 Min Walk: 1218 feet. The patient's lowest oxygen saturation was 90 %, highest heart rate was 118 bpm , and highest blood pressure was 180/80 07/2016 6 MW> 1320 feet 3 L O2 88%  Chest imaging: CT chest 2008:  Mild bronchiectasis, ?NSIP: CXR 05/2014:  Progressive IS changes compared to 2012. HRCT 04/2017: traction bronchiectasis and bronchovascular and peripheral  fibrotic changes, worse in the upper lobes, some air trapping, no significant progression compared to 09/2016  HRCT 2016:  Changes c/w fibrotic NSIP?  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  Compared to her lung function testing and imaging from 2 years ago there has been some progression but there is not been a dramatic worsening in 2018.  I believe that her worsening sensation of shortness of breath is likely related to worsening deconditioning.  To my knowledge there are no new medications which are available to stop the progression of chronic hypersensitivity pneumonitis.  I wonder whether or not diastolic heart failure may be at play here.  She clearly has pulmonary hypertension secondary to her severe chronic respiratory failure from her lung disease but there is no role for a pulmonary vasodilator here.  I think a trial of more aggressive diuresis is appropriate.  I am going to have her use a higher dose of Lasix for the next week and measure her weight.  Hopefully on the next visit she will be feeling better and we can try to establish a dry weight and come up with a new strategy for keeping her there.  Plan: Chronic respiratory failure with hypoxemia: Keep using 3 L of oxygen  Leg swelling: This is likely secondary to some component of diastolic heart failure and pulmonary hypertension: I will increase her Lasix to 40 mg Take Lasix every day for the next 7 days Weigh yourself at the same time in the same outfit every day for the next 7 days and please keep a record of this and bring it next  week Follow-up in 1 week with a nurse practitioner, we will arrange for a kidney function test Eat a banana every day for the next 7 days  Chronic fibrotic hypersensitivity pneumonitis: As we discussed today I do not believe there are any new medications, though I will reach out to my colleagues at [facility] to see if they know of anything else.  We will see you back in 7 days or sooner if needed   Current Outpatient Medications:  .  albuterol (PROVENTIL HFA;VENTOLIN HFA) 108 (90 BASE) MCG/ACT inhaler, Inhale 2 puffs into the lungs every 6 (six) hours as needed for wheezing or shortness of breath. , Disp: , Rfl:  .  albuterol (PROVENTIL) (2.5 MG/3ML) 0.083% nebulizer solution, Take 3 mLs (2.5 mg total) by  nebulization every 6 (six) hours as needed for wheezing or shortness of breath., Disp: 360 mL, Rfl: 5 .  aspirin 325 MG tablet, Take 650 mg by mouth every 6 (six) hours as needed for mild pain or moderate pain. , Disp: , Rfl:  .  budesonide (PULMICORT) 0.5 MG/2ML nebulizer solution, Take 2 mLs (0.5 mg total) by nebulization 2 (two) times daily., Disp: 120 mL, Rfl: 12 .  fluticasone (FLONASE) 50 MCG/ACT nasal spray, Place 1 spray into both nostrils daily., Disp: , Rfl:  .  furosemide (LASIX) 20 MG tablet, Take 20 mg by mouth as needed for fluid. , Disp: , Rfl:  .  ibuprofen (ADVIL,MOTRIN) 200 MG tablet, Take 400 mg by mouth every 6 (six) hours as needed for mild pain or moderate pain. , Disp: , Rfl:  .  loratadine (CLARITIN) 5 MG chewable tablet, Chew 5 mg by mouth daily as needed for allergies., Disp: , Rfl:

## 2017-05-04 NOTE — Patient Instructions (Signed)
Chronic respiratory failure with hypoxemia: Keep using 3 L of oxygen  Leg swelling: This is likely secondary to some component of diastolic heart failure and pulmonary hypertension: I will increase her Lasix to 40 mg Take Lasix every day for the next 7 days Weigh yourself at the same time in the same outfit every day for the next 7 days and please keep a record of this and bring it next week Follow-up in 1 week with a nurse practitioner, we will arrange for a kidney function test Eat a banana every day for the next 7 days  Chronic fibrotic hypersensitivity pneumonitis: As we discussed today I do not believe there are any new medications, though I will reach out to my colleagues at [facility] to see if they know of anything else.  We will see you back in 7 days or sooner if needed

## 2017-05-11 ENCOUNTER — Other Ambulatory Visit (INDEPENDENT_AMBULATORY_CARE_PROVIDER_SITE_OTHER): Payer: Medicare Other

## 2017-05-11 ENCOUNTER — Ambulatory Visit (INDEPENDENT_AMBULATORY_CARE_PROVIDER_SITE_OTHER): Payer: Medicare Other | Admitting: Adult Health

## 2017-05-11 ENCOUNTER — Encounter: Payer: Self-pay | Admitting: Adult Health

## 2017-05-11 DIAGNOSIS — I5030 Unspecified diastolic (congestive) heart failure: Secondary | ICD-10-CM | POA: Insufficient documentation

## 2017-05-11 DIAGNOSIS — I5032 Chronic diastolic (congestive) heart failure: Secondary | ICD-10-CM

## 2017-05-11 DIAGNOSIS — Z5181 Encounter for therapeutic drug level monitoring: Secondary | ICD-10-CM

## 2017-05-11 DIAGNOSIS — J8410 Pulmonary fibrosis, unspecified: Secondary | ICD-10-CM | POA: Diagnosis not present

## 2017-05-11 DIAGNOSIS — J479 Bronchiectasis, uncomplicated: Secondary | ICD-10-CM

## 2017-05-11 LAB — BASIC METABOLIC PANEL
BUN: 10 mg/dL (ref 6–23)
CO2: 35 mEq/L — ABNORMAL HIGH (ref 19–32)
Calcium: 8.9 mg/dL (ref 8.4–10.5)
Chloride: 97 mEq/L (ref 96–112)
Creatinine, Ser: 0.6 mg/dL (ref 0.40–1.20)
GFR: 102.9 mL/min (ref >60.00)
Glucose, Bld: 89 mg/dL (ref 70–99)
Potassium: 3.6 mEq/L (ref 3.5–5.1)
Sodium: 139 mEq/L (ref 135–145)

## 2017-05-11 NOTE — Assessment & Plan Note (Addendum)
D CHF w/ PAH recent decompensation now improved with diuresis  Change lasix daily As needed   Low salt diet  .  Check bmet.   Plan  Patient Instructions  Labs today .  Continue on Pulmicort neb Twice daily  . Continue on Oxygen 3l/m .  May use lasix 75m daily As needed  Leg swelling .  Follow up in 6 weeks with Dr. MLake Bellsand As needed   Please contact office for sooner follow up if symptoms do not improve or worsen or seek emergency care

## 2017-05-11 NOTE — Progress Notes (Signed)
_0  ID: [name], female    DOB: 1940/06/27, 76 y.o.   MRN: [id number]  Chief Complaint  Patient presents with  . Follow-up    ILD     Referring provider: Lawerance Cruel, MD  HPI: 75 yo female never smoker followed for interstitial pneumonitis (Lung bx 11/2014) . 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  PFT's January 2016:  FVC 2.13 (66%), no obstruction, TLC 4.00 (72%), DLCO 13.70 (48%) March 2017 pulmonary function testing ratio 88%, FVC 2.03 L (84% predicted), total lung capacity 3.0 L (54% predicted), DLCO 14.7 (51% predicted) April 2018 ratio 89%, FVC 1.91 L 61% predicted, total lung capacity 3.49 L 63% predicted, DLCO 12.69 44% predicted November 2018 pulmonary function testing ratio 90%, FVC 1.72 L 55% predicted, total lung capacity 3.37 L 61% predicted, DLCO 11.2 39% predicted  6MW 02/2015 6 Min Walk: 1218 feet. The patient's lowest oxygen saturation was 90 %, highest heart rate was 118 bpm , and highest blood pressure was 180/80 07/2016 6 MW> 1320 feet 3 L O2 88%  Chest imaging: CT chest 2008:  Mild bronchiectasis, ?NSIP: CXR 05/2014:  Progressive IS changes compared to 2012. HRCT 04/2017: traction bronchiectasis and bronchovascular and peripheral fibrotic changes, worse in the upper lobes, some air trapping, no significant progression compared to 09/2016  HRCT 2016:  Changes c/w fibrotic NSIP?  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  Last visit patient was having increased swelling and shortness of breath.  Felt to have decompensated  diastolic heart failure.  Lasix was increased to 40 mg. Wt is down 3-4 lbs. Legs swelling resolved. Breathing is slightly better.   Denies increased cough or congestion .  HRCT chest last month showed stable fibrosis and bronchietasis .  On pulmicort neb Twice daily  .      Allergies  Allergen Reactions  . Adhesive [Tape] Itching    Rash  . Sulfonamide Derivatives Other (See Comments)    Unknown    Immunization History  Administered Date(s) Administered  . Influenza, High Dose Seasonal PF 04/20/2016, 02/23/2017  . Influenza-Unspecified 03/06/2014, 02/18/2015  . Pneumococcal Conjugate-13 03/06/2014    Past Medical History:  Diagnosis Date  . Allergic rhinitis    takes Claritin nightly  . Anxiety    but not on any meds  . Arthritis   . Asthma   . Carpal tunnel syndrome   . GERD (gastroesophageal reflux disease)   . History of bronchitis 3-50yr ago  . History of colon polyps    benign  . Joint pain   . Peripheral edema    takes Lasix daily  . Shortness of breath dyspnea    with exertion;Albuterol inhaler as needed  . Thyroid nodule     Tobacco History: Social History   Tobacco Use  Smoking Status Never Smoker  Smokeless Tobacco Never Used   Counseling given: Not Answered   Outpatient Encounter Medications as of  05/11/2017  Medication Sig  . albuterol (PROVENTIL HFA;VENTOLIN HFA) 108 (90 BASE) MCG/ACT inhaler Inhale 2 puffs into the lungs every 6 (six) hours as needed for wheezing or shortness of breath.   Marland Kitchen albuterol (PROVENTIL) (2.5 MG/3ML) 0.083% nebulizer solution Take 3 mLs (2.5 mg total) by nebulization every 6 (six) hours as needed for wheezing or shortness of breath.  Marland Kitchen aspirin 325 MG tablet Take 650 mg by mouth every 6 (six) hours as needed for mild pain or moderate pain.   . budesonide  (PULMICORT) 0.5 MG/2ML nebulizer solution Take 2 mLs (0.5 mg total) by nebulization 2 (two) times daily.  . fluticasone (FLONASE) 50 MCG/ACT nasal spray Place 1 spray into both nostrils daily.  . furosemide (LASIX) 40 MG tablet TAKE 1 TABLET(40 MG) BY MOUTH DAILY  . ibuprofen (ADVIL,MOTRIN) 200 MG tablet Take 400 mg by mouth every 6 (six) hours as needed for mild pain or moderate pain.   Marland Kitchen loratadine (CLARITIN) 5 MG chewable tablet Chew 5 mg by mouth daily as needed for allergies.   No facility-administered encounter medications on file as of 05/11/2017.      Review of Systems  Constitutional:   No  weight loss, night sweats,  Fevers, chills,  +fatigue, or  lassitude.  HEENT:   No headaches,  Difficulty swallowing,  Tooth/dental problems, or  Sore throat,                No sneezing, itching, ear ache, nasal congestion, post nasal drip,   CV:  No chest pain,  Orthopnea, PND, , anasarca, dizziness, palpitations, syncope.   GI  No heartburn, indigestion, abdominal pain, nausea, vomiting, diarrhea, change in bowel habits, loss of appetite, bloody stools.   Resp:  No excess mucus, no productive cough,  No non-productive cough,  No coughing up of blood.  No change in color of mucus.  No wheezing.  No chest wall deformity  Skin: no rash or lesions.  GU: no dysuria, change in color of urine, no urgency or frequency.  No flank pain, no hematuria   MS:  No joint pain or swelling.  No decreased range of motion.  No back pain.    Physical Exam  BP 134/76   Pulse 97   Ht _0  (1.702 m)   Wt 221 lb 2 oz (100.3 kg)   SpO2 93%   BMI 34.63 kg/m   GEN: A/Ox3; pleasant , NAD , elderly on O2    HEENT:  [place]/AT,  EACs-clear, TMs-wnl, NOSE-clear, THROAT-clear, no lesions, no postnasal drip or exudate noted.   NECK:  Supple w/ fair ROM; no JVD; normal carotid impulses w/o bruits; no thyromegaly or nodules palpated; no lymphadenopathy.    RESP  Clear  P & A; w/o, wheezes/ rales/ or rhonchi. no  accessory muscle use, no dullness to percussion  CARD:  RRR, no m/r/g, no peripheral edema, pulses intact, no cyanosis or clubbing.  GI:   Soft & nt; nml bowel sounds; no organomegaly or masses detected.   Musco: Warm bil, no deformities or joint swelling noted.   Neuro: alert, no focal deficits noted.    Skin: Warm, no lesions or rashes    Lab Results:  CBC  BNP  ProBNP    Component Value Date/Time   PROBNP 22.0 04/19/2017 1617    Imaging: Dg Chest 2 View  Result Date: 04/20/2017 CLINICAL DATA:  Increased shortness of breath over the past 2 weeks. History of asthma and bronchitis, bronchiectasis, hypersensitivity pneumonitis, pulmonary  hypertension, never smoked. EXAM: CHEST  2 VIEW COMPARISON:  PA and lateral chest x-ray of Oct 10, 2016. FINDINGS: The lungs are well-expanded. The interstitial markings are diffusely increased but are stable. There is no discrete alveolar infiltrate. There is no significant pleural effusion. The heart is top-normal in size. The pulmonary vascularity is not clearly engorged. There is calcification in the wall of the aortic arch. There is mild dextrocurvature centered in the midthoracic spine with multilevel degenerative disc disease. IMPRESSION: Chronic interstitial changes consistent with the patient's known history of pulmonary fibrosis as well as reactive airway disease and bronchiectasis. No acute pneumonia nor pulmonary edema. Thoracic aortic atherosclerosis. Electronically Signed   By: David  Martinique M.D.   On: 04/20/2017 07:57   Ct Chest High Resolution  Result Date: 04/25/2017 CLINICAL DATA:  76 year old female with history of interstitial lung disease. Rattling productive cough approximately 2 weeks ago. Recently treated with antibiotics and currently on prednisone therapy. EXAM: CT CHEST WITHOUT CONTRAST TECHNIQUE: Multidetector CT imaging of the chest was performed following the standard protocol without intravenous contrast. High  resolution imaging of the lungs, as well as inspiratory and expiratory imaging, was performed. COMPARISON:  High-resolution chest CT 09/21/2016. FINDINGS: Cardiovascular: Heart size is normal. There is no significant pericardial fluid, thickening or pericardial calcification. Aortic atherosclerosis. No definite coronary artery calcifications. Mild calcifications of the aortic valve. Moderate calcifications of the mitral annulus. Mediastinum/Nodes: Multiple prominent nonenlarged mediastinal lymph nodes, similar to prior examinations (nonspecific). Please note that accurate exclusion of hilar adenopathy is limited on noncontrast CT scans. Esophagus is unremarkable in appearance. No axillary lymphadenopathy. Lungs/Pleura: High-resolution images again demonstrate patchy areas of ground-glass attenuation and septal thickening. There are patchy areas of predominantly cylindrical traction bronchiectasis and peripheral bronchiolectasis. A few patchy areas of very mild honeycombing are noted, however, this is not a predominant pattern. These findings have no discernible craniocaudal gradient, and some of the areas of greatest involvement are actually in the upper lobes of the lungs bilaterally, particularly anteriorly. Inspiratory and expiratory imaging demonstrates extensive air trapping indicative of small airways disease. Significant collapse of the trachea is also noted on expiratory phase imaging indicative of tracheomalacia. No acute consolidative airspace disease. No pleural effusions. No suspicious appearing pulmonary nodules or masses. Suture line in the periphery of the right upper lobe related to prior open lung biopsy. Upper Abdomen: Numerous calcified gallstones lying dependently in the gallbladder. Aortic atherosclerosis. Musculoskeletal: There are no aggressive appearing lytic or blastic lesions noted in the visualized portions of the skeleton. IMPRESSION: 1. 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 Specifically, given the lack of a craniocaudal gradient and slight anterior upper lobe predominance of the most severe findings, as well as extensive and worsening air trapping, with only minimal progression over the past several years (and no significant progression compared to the prior study from April 2018), findings are strongly favored to reflect chronic hypersensitivity pneumonitis. This was discussed with Dr. Lake Bells and is most compatible with his clinical assessment, and pathology over read (performed at [facility]) of prior open lung biopsy from 2016. 2. Worsening tracheomalacia. 3. Aortic atherosclerosis. 4. There are calcifications of the aortic valve and mitral annulus. Echocardiographic correlation for evaluation of potential valvular dysfunction may be warranted if clinically indicated. 5. Cholelithiasis. Aortic Atherosclerosis (ICD10-I70.0). Electronically Signed   By: Vinnie Langton M.D.   On: 04/25/2017 10:06     Assessment & Plan:   Diastolic  CHF ([place]) D CHF w/ PAH recent decompensation now improved with diuresis  Change lasix daily As needed   Low salt diet  .  Check bmet.   Plan  Patient Instructions  Labs today .  Continue on Pulmicort neb Twice daily  . Continue on Oxygen 3l/m .  May use lasix 60m daily As needed  Leg swelling .  Follow up in 6 weeks with Dr. MLake Bellsand As needed   Please contact office for sooner follow up if symptoms do not improve or worsen or seek emergency care       [place]ulmonary fibrosis [place] Appears stable without flare   Plan  No changes   Bronchiectasis without acute exacerbation Cont on Pulmicort neb Twice daily  .  No changes      TRexene Edison NP 05/11/2017

## 2017-05-11 NOTE — Patient Instructions (Signed)
Labs today .  Continue on Pulmicort neb Twice daily  . Continue on Oxygen 3l/m .  May use lasix 29m daily As needed  Leg swelling .  Follow up in 6 weeks with Dr. MLake Bellsand As needed   Please contact office for sooner follow up if symptoms do not improve or worsen or seek emergency care

## 2017-05-11 NOTE — Assessment & Plan Note (Signed)
Cont on Pulmicort neb Twice daily  .  No changes

## 2017-05-11 NOTE — Progress Notes (Signed)
Reviewed, agree 

## 2017-05-11 NOTE — Assessment & Plan Note (Signed)
Appears stable without flare   Plan  No changes

## 2017-05-12 DIAGNOSIS — H0100A Unspecified blepharitis right eye, upper and lower eyelids: Secondary | ICD-10-CM | POA: Diagnosis not present

## 2017-05-12 DIAGNOSIS — H0100B Unspecified blepharitis left eye, upper and lower eyelids: Secondary | ICD-10-CM | POA: Diagnosis not present

## 2017-05-12 DIAGNOSIS — H04123 Dry eye syndrome of bilateral lacrimal glands: Secondary | ICD-10-CM | POA: Diagnosis not present

## 2017-06-14 DIAGNOSIS — E7800 Pure hypercholesterolemia, unspecified: Secondary | ICD-10-CM | POA: Diagnosis not present

## 2017-06-14 DIAGNOSIS — R609 Edema, unspecified: Secondary | ICD-10-CM | POA: Diagnosis not present

## 2017-06-14 DIAGNOSIS — M069 Rheumatoid arthritis, unspecified: Secondary | ICD-10-CM | POA: Diagnosis not present

## 2017-06-14 DIAGNOSIS — Z23 Encounter for immunization: Secondary | ICD-10-CM | POA: Diagnosis not present

## 2017-06-14 DIAGNOSIS — E559 Vitamin D deficiency, unspecified: Secondary | ICD-10-CM | POA: Diagnosis not present

## 2017-06-14 DIAGNOSIS — Z0000 Encounter for general adult medical examination without abnormal findings: Secondary | ICD-10-CM | POA: Diagnosis not present

## 2017-06-14 DIAGNOSIS — Z131 Encounter for screening for diabetes mellitus: Secondary | ICD-10-CM | POA: Diagnosis not present

## 2017-06-30 ENCOUNTER — Encounter: Payer: Self-pay | Admitting: Pulmonary Disease

## 2017-06-30 ENCOUNTER — Ambulatory Visit (INDEPENDENT_AMBULATORY_CARE_PROVIDER_SITE_OTHER): Payer: Medicare Other | Admitting: Pulmonary Disease

## 2017-06-30 VITALS — BP 142/78 | HR 71 | Ht 67.0 in | Wt 221.4 lb

## 2017-06-30 DIAGNOSIS — I272 Pulmonary hypertension, unspecified: Secondary | ICD-10-CM

## 2017-06-30 DIAGNOSIS — J849 Interstitial pulmonary disease, unspecified: Secondary | ICD-10-CM

## 2017-06-30 DIAGNOSIS — J9621 Acute and chronic respiratory failure with hypoxia: Secondary | ICD-10-CM

## 2017-06-30 HISTORY — DX: Pulmonary hypertension, unspecified: I27.20

## 2017-06-30 MED ORDER — DOXYCYCLINE HYCLATE 100 MG PO TABS: 100.0000 mg | ORAL_TABLET | Freq: Two times a day (BID) | ORAL | 0 refills | Status: DC

## 2017-06-30 NOTE — Progress Notes (Signed)
Subjective:    Patient ID: [name], female    DOB: [birth year], 77 y.o.   MRN: [id number]  Synopsis: Former patient of Dr. 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   HPI Chief Complaint  Patient presents with  . Follow-up    c/o increased dyspnea with any exertion.     [name] says that she doesn't think that her breathing got much better after we adjusted her diuretics after the last visit.  She saw her PCP and told her to take some Vit D recently.  She says that she had a fairly involved surgery by her dentist recently.  Since then she has had some increased heart rate when she gets up to walk around more than normal.    She has been having more sinus drainage lately than normal and feels like she has more congestion: >   She has no energy right now.  She is not exercising at all right now.    Past Medical History:  Diagnosis Date  . Allergic rhinitis    takes Claritin nightly  . Anxiety    but not on any meds  . Arthritis   . Asthma   . Carpal tunnel syndrome   . GERD (gastroesophageal reflux disease)   . History of bronchitis 3-27yr ago  . History of colon polyps    benign  . Joint pain   . Peripheral edema    takes Lasix daily  . Shortness of breath dyspnea    with exertion;Albuterol inhaler as needed  . Thyroid nodule       Review of Systems  Constitutional: Negative for chills, fatigue and fever.  HENT: Negative for postnasal drip, rhinorrhea and sinus pressure.   Respiratory: Negative for cough, shortness of breath and wheezing.   Cardiovascular: Negative for chest pain, palpitations and leg swelling.       Objective:   Physical Exam Vitals:   06/30/17 1435  BP: (!) 142/78  Pulse: 71  SpO2: 96%  Weight: 221 lb 6.4 oz (100.4 kg)    Height: _0  (1.702 m)   3L pulse flow  Gen: well appearing HENT: OP clear, TM's clear, neck supple PULM: Crackles wheezes bases B, normal percussion CV: RRR, no mgr, trace edema GI: BS+, soft, nontender Derm: no cyanosis or rash Psyche: normal mood and affect        CBC    Component Value Date/Time   WBC 10.2 04/19/2017 1617   RBC 4.52 04/19/2017 1617   HGB 13.3 04/19/2017 1617   HCT 41.0 04/19/2017 1617   PLT 377.0 04/19/2017 1617   MCV 90.7 04/19/2017 1617   MCH 29.3 06/18/2016 0507   MCHC 32.4 04/19/2017 1617   RDW 14.0 04/19/2017 1617   LYMPHSABS 3.5 04/19/2017 1617   MONOABS 0.9 04/19/2017 1617   EOSABS 0.7 04/19/2017 1617   BASOSABS 0.1 04/19/2017 1617   Imaging: 06/2016 CT angiogram> no PE, superimposed ground glass changes in addition to baseline pulmonary fibrosis  Pulmonary function test: PFT's 2009:  FEV1 1.97 (84%),ratio 70, 22% increase FEV1 with BD, TLC 4.44 (82%), DLCO 16.7 (62%) 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  PFT's January 2016:  FVC 2.13 (66%), no obstruction, TLC 4.00 (72%),  DLCO 13.70 (48%) March 2017 pulmonary function testing ratio 88%, FVC 2.03 L (84% predicted), total lung capacity 3.0 L (54% predicted), DLCO 14.7 (51% predicted) April 2018 ratio 89%, FVC 1.91 L 61% predicted, total lung capacity 3.49 L 63% predicted, DLCO 12.69 44% predicted November 2018 pulmonary function testing ratio 90%, FVC 1.72 L 55% predicted, total lung capacity 3.37 L 61% predicted, DLCO 11.2 39% predicted  6MW 02/2015 6 Min Walk: 1218 feet. The patient's lowest oxygen saturation was 90 %, highest heart rate was 118 bpm , and highest blood pressure was 180/80 07/2016 6 MW> 1320 feet 3 L O2 88%  Chest imaging: CT chest 2008:  Mild bronchiectasis, ?NSIP: CXR 05/2014:  Progressive IS changes compared to 2012. HRCT 04/2017: traction bronchiectasis and  bronchovascular and peripheral fibrotic changes, worse in the upper lobes, some air trapping, no significant progression compared to 09/2016  HRCT 2016:  Changes c/w fibrotic NSIP?  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  I think the most likely explanation is a viral sinus infection though she did have a recent dental surgery so she is at increased risk for bacterial sinusitis.  Using oxygen without a humidifier may also be contributing.  I do not think her lung disease has progressed as her oxygenation is the same, we will check a walking test today to make sure her O2 saturation is the same.  I want to try to make the sinus congestion go away: Going to have her try saline rinses and gel with a humidifier for her oxygen at first.  However if she does not have improvement in 48 hours then we will have her take an antibiotic.  If she still feels rundown when she comes back to see [place] in 2 weeks then I am going to have her get a lung function test and sleep study on the next visit.  Plan: Chronic respiratory failure with hypoxemia: Continue using 3 L of oxygen continuously We will check her oxygen level while walking on 3 L today to make sure this dose is appropriate  Interstitial lung disease: For now the plan is to repeat a lung function test in April However, if you still feel poorly when you come back in 2 weeks we will get the lung function test sooner  Sinus congestion: I think this is likely due to a viral illness exacerbated by all the  oxygen you are using, though a bacterial process is possible: Use saline rinses 2-3 times a day for the next 2 days Use saline gel in each nostril 2-3 times a day We will add a humidifier for your oxygen If the symptoms have not improved after 48 hours of more aggressive rinsing then take the doxycycline prescription we gave you  We will see you back in 2 weeks with a nurse practitioner  Greater than 50% of this 27-minute visit spent face-to-face   Current Outpatient Medications:  .  albuterol (PROVENTIL HFA;VENTOLIN HFA) 108 (90 BASE) MCG/ACT inhaler, Inhale 2 puffs into the lungs every 6 (six)  hours as needed for wheezing or shortness of breath. , Disp: , Rfl:  .  albuterol (PROVENTIL) (2.5 MG/3ML) 0.083% nebulizer solution, Take 3 mLs (2.5 mg total) by nebulization every 6 (six) hours as needed for wheezing or shortness of breath., Disp: 360 mL, Rfl: 5 .  aspirin 325 MG tablet, Take 650 mg by mouth every 6 (six) hours as needed for mild pain or moderate pain. , Disp: , Rfl:  .  budesonide (PULMICORT) 0.5 MG/2ML nebulizer solution, Take 2 mLs (0.5 mg total) by nebulization 2 (two) times daily., Disp: 120 mL, Rfl: 12 .  fluticasone (FLONASE) 50 MCG/ACT nasal spray, Place 1 spray into both nostrils daily., Disp: , Rfl:  .  furosemide (LASIX) 40 MG tablet, TAKE 1 TABLET(40 MG) BY MOUTH DAILY, Disp: 90 tablet, Rfl: 0 .  ibuprofen (ADVIL,MOTRIN) 200 MG tablet, Take 400 mg by mouth every 6 (six) hours as needed for mild pain or moderate pain. , Disp: , Rfl:  .  loratadine (CLARITIN) 5 MG chewable tablet, Chew 5 mg by mouth daily as needed for allergies., Disp: , Rfl:

## 2017-06-30 NOTE — Patient Instructions (Signed)
Chronic respiratory failure with hypoxemia: Continue using 3 L of oxygen continuously We will check her oxygen level while walking on 3 L today to make sure this dose is appropriate  Interstitial lung disease: For now the plan is to repeat a lung function test in April However, if you still feel poorly when you come back in 2 weeks we will get the lung function test sooner  Sinus congestion: I think this is likely due to a viral illness exacerbated by all the oxygen you are using, though a bacterial process is possible: Use saline rinses 2-3 times a day for the next 2 days Use saline gel in each nostril 2-3 times a day We will add a humidifier for your oxygen If the symptoms have not improved after 48 hours of more aggressive rinsing then take the doxycycline prescription we gave you  We will see you back in 2 weeks with a nurse practitioner

## 2017-07-05 ENCOUNTER — Telehealth: Payer: Self-pay | Admitting: Pulmonary Disease

## 2017-07-05 MED ORDER — AMOXICILLIN-POT CLAVULANATE 875-125 MG PO TABS: 1.0000 | ORAL_TABLET | Freq: Two times a day (BID) | ORAL | 0 refills | Status: DC

## 2017-07-05 NOTE — Telephone Encounter (Signed)
Spoke with patient. She is aware of BQ's recs. Medication has been called into the pharmacy for her. I have also added doxy to her medication allergies. She is aware of this. Nothing else needed at time of call.

## 2017-07-05 NOTE — Telephone Encounter (Signed)
Spoke with patient. She was prescribed doxycycline during her 1/25. She took the medication on 1/28 and woke up that night with "itchy spots" all over her stomach, legs and arms. She took it again and the spots became more irritated. Denies any SOB or feeling like her throat is about to close up.   She wishes to have another medication called in for her.   Pharmacy is [facility] on [address].   BQ, please advise. Thanks!

## 2017-07-05 NOTE — Telephone Encounter (Signed)
Stop doxycycline Start Augmentin 875 bid x 5 days

## 2017-07-14 ENCOUNTER — Ambulatory Visit (INDEPENDENT_AMBULATORY_CARE_PROVIDER_SITE_OTHER): Payer: Medicare Other | Admitting: Adult Health

## 2017-07-14 ENCOUNTER — Ambulatory Visit (INDEPENDENT_AMBULATORY_CARE_PROVIDER_SITE_OTHER)
Admission: RE | Admit: 2017-07-14 | Discharge: 2017-07-14 | Disposition: A | Discharge status: Home / Self Care | Payer: Medicare Other | Source: Ambulatory Visit | Attending: Adult Health | Admitting: Adult Health

## 2017-07-14 ENCOUNTER — Encounter: Payer: Self-pay | Admitting: Adult Health

## 2017-07-14 VITALS — BP 136/68 | HR 78 | Ht 66.0 in | Wt 221.8 lb

## 2017-07-14 DIAGNOSIS — J9611 Chronic respiratory failure with hypoxia: Secondary | ICD-10-CM | POA: Insufficient documentation

## 2017-07-14 DIAGNOSIS — J849 Interstitial pulmonary disease, unspecified: Secondary | ICD-10-CM

## 2017-07-14 DIAGNOSIS — J479 Bronchiectasis, uncomplicated: Secondary | ICD-10-CM

## 2017-07-14 DIAGNOSIS — J8410 Pulmonary fibrosis, unspecified: Secondary | ICD-10-CM | POA: Diagnosis not present

## 2017-07-14 MED ORDER — AMOXICILLIN-POT CLAVULANATE 875-125 MG PO TABS: 1.0000 | ORAL_TABLET | Freq: Two times a day (BID) | ORAL | 0 refills | Status: AC

## 2017-07-14 NOTE — Assessment & Plan Note (Signed)
Cont on O2

## 2017-07-14 NOTE — Assessment & Plan Note (Signed)
Appears stable with no increased O2 demands  Continue to monitor .  Recent HRCT Chest w/o sign change/progression

## 2017-07-14 NOTE — Progress Notes (Signed)
Reviewed, agree 

## 2017-07-14 NOTE — Progress Notes (Signed)
_0  ID: [name], female    DOB: 11/10/1940, 77 y.o.   MRN: [id number]  Chief Complaint  Patient presents with  . Follow-up    Bronchitis     Referring provider: Lawerance Cruel, MD  HPI: 77 yo female never smoker followed for interstitial pneumonitis (Lung bx 11/2014) . 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  PFT's January 2016: FVC 2.13 (66%), no obstruction, TLC 4.00 (72%), DLCO 13.70 (48%) March 2017 pulmonary function testing ratio 88%, FVC 2.03 L (84% predicted), total lung capacity 3.0 L (54% predicted), DLCO 14.7 (51% predicted) April 2018 ratio 89%, FVC 1.91 L 61% predicted, total lung capacity 3.49 L 63% predicted, DLCO 12.69 44% predicted November 2018 pulmonary function testing ratio 90%, FVC 1.72 L 55% predicted, total lung capacity 3.37 L 61% predicted, DLCO 11.2 39% predicted  6MW 02/2015 6 Min Walk: 1218 feet. The patient's lowest oxygen saturation was 90 %, highest heart rate was 118 bpm , and highest blood pressure was 180/80 07/2016 6 MW> 1320 feet 3 L O2 88%  Chest imaging: CT chest 2008: Mild bronchiectasis, ?NSIP: CXR 05/2014: Progressive IS changes compared to 2012. HRCT 04/2017: traction bronchiectasis and bronchovascular and peripheral fibrotic changes, worse in the upper lobes, some air trapping, no significant progression compared to 09/2016  HRCT 2016: Changes c/w fibrotic NSIP? 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  Patient says she has had increased cough congestion and thick mucus. And sinus congestion for 2 weeks.  She was started on doxycycline but was unable to tolerate after few days due to rash.  She was then called in Augmentin on January 30.  She says it did help some but she continues to have some productive cough with thick white yellow mucus. Yellow mucus is picking up last few days . Feels more tired and worn down. Appetite is good.   Patient had a high resolution CT chest April 25, 2017 that showed chronic ILD changes with no significant progression since April 2018. Worsening tracheomalacia was noted.   Allergies  Allergen Reactions  . Adhesive [Tape] Itching    Rash  . Doxycycline Rash  . Sulfonamide Derivatives Other (See Comments)    Unknown    Immunization History  Administered Date(s) Administered  . Influenza, High Dose Seasonal PF 04/20/2016, 02/23/2017  . Influenza-Unspecified 03/06/2014, 02/18/2015  . Pneumococcal Conjugate-13 03/06/2014    Past Medical History:  Diagnosis Date  . Allergic rhinitis    takes Claritin nightly  . Anxiety    but not on any meds  . Arthritis   . Asthma   . Carpal tunnel syndrome   . GERD (gastroesophageal reflux disease)   . History of bronchitis 3-76yr ago  . History of colon polyps    benign  . Joint pain   . Peripheral edema    takes Lasix daily  . Shortness of breath dyspnea    with exertion;Albuterol inhaler as needed  .  Thyroid nodule     Tobacco History: Social History   Tobacco Use  Smoking Status Never Smoker  Smokeless Tobacco Never Used   Counseling given: Not Answered   Outpatient Encounter Medications as of 07/14/2017  Medication Sig  . albuterol (PROVENTIL HFA;VENTOLIN HFA) 108 (90 BASE) MCG/ACT inhaler Inhale 2 puffs into the lungs every 6 (six) hours as needed for wheezing or shortness of breath.   Marland Kitchen albuterol (PROVENTIL) (2.5  MG/3ML) 0.083% nebulizer solution Take 3 mLs (2.5 mg total) by nebulization every 6 (six) hours as needed for wheezing or shortness of breath.  Marland Kitchen aspirin 325 MG tablet Take 650 mg by mouth every 6 (six) hours as needed for mild pain or moderate pain.   . budesonide (PULMICORT) 0.5 MG/2ML nebulizer solution Take 2 mLs (0.5 mg total) by nebulization 2 (two) times daily.  . fluticasone (FLONASE) 50 MCG/ACT nasal spray Place 1 spray into both nostrils daily.  . furosemide (LASIX) 40 MG tablet TAKE 1 TABLET(40 MG) BY MOUTH DAILY  . ibuprofen (ADVIL,MOTRIN) 200 MG tablet Take 400 mg by mouth every 6 (six) hours as needed for mild pain or moderate pain.   Marland Kitchen loratadine (CLARITIN) 5 MG chewable tablet Chew 5 mg by mouth daily as needed for allergies.  Marland Kitchen amoxicillin-clavulanate (AUGMENTIN) 875-125 MG tablet Take 1 tablet by mouth 2 (two) times daily. (Patient not taking: Reported on 07/14/2017)  . amoxicillin-clavulanate (AUGMENTIN) 875-125 MG tablet Take 1 tablet by mouth 2 (two) times daily for 7 days.   No facility-administered encounter medications on file as of 07/14/2017.      Review of Systems  Constitutional:   No  weight loss, night sweats,  Fevers, chills, + fatigue, or  lassitude.  HEENT:   No headaches,  Difficulty swallowing,  Tooth/dental problems, or  Sore throat,                No sneezing, itching, ear ache,  +nasal congestion, post nasal drip,   CV:  No chest pain,  Orthopnea, PND, swelling in lower extremities, anasarca, dizziness, palpitations, syncope.   GI  No heartburn, indigestion, abdominal pain, nausea, vomiting, diarrhea, change in bowel habits, loss of appetite, bloody stools.   Resp:    No chest wall deformity  Skin: no rash or lesions.  GU: no dysuria, change in color of urine, no urgency or frequency.  No flank pain, no hematuria   MS:  No joint pain or swelling.  No decreased range of motion.  No back pain.    Physical Exam  BP 136/68 (BP Location: Left Arm,  Cuff Size: Normal)   Pulse 78   Ht _0  (1.676 m)   Wt 221 lb 12.8 oz (100.6 kg)   SpO2 95%   BMI 35.80 kg/m   GEN: A/Ox3; pleasant , NAD, elderly    HEENT:  [place]/AT,  EACs-clear, TMs-wnl, NOSE-clear, THROAT-clear, no lesions, no postnasal drip or exudate noted.   NECK:  Supple w/ fair ROM; no JVD; normal carotid impulses w/o bruits; no thyromegaly or nodules palpated; no lymphadenopathy.    RESP  Few rhonchi, BB crackles .  no accessory muscle use, no dullness to percussion  CARD:  RRR, no m/r/g, no peripheral edema, pulses intact, no cyanosis or clubbing.  GI:   Soft & nt; nml bowel sounds; no organomegaly or masses detected.   Musco: Warm bil, no deformities or joint swelling noted.   Neuro: alert, no focal deficits noted.    Skin: Warm, no lesions or  rashes    Lab Results:  CBC  BMET  BNP  Imaging: Dg Chest 2 View  Result Date: 07/14/2017 CLINICAL DATA:  Interstitial lung disease. EXAM: CHEST  2 VIEW COMPARISON:  04/19/2017 and chest CT dated 04/24/2017. FINDINGS: Stable mildly enlarged cardiac silhouette. The lungs remain hyperexpanded with stable diffusely prominent interstitial markings. No pleural fluid. Thoracic and upper lumbar spine degenerative changes. Mild bilateral shoulder degenerative changes. IMPRESSION: Stable cardiomegaly and interstitial fibrosis. No acute abnormality. Electronically Signed   By: Claudie Revering M.D.   On: 07/14/2017 15:32     Assessment & Plan:   Bronchiectasis without acute exacerbation Slow to resolve exacerbation -despite abx x 1 week .  Check cxr   Plan  Patient Instructions  Augmentin 875 Twice daily  For 1 week  Mucinex DM Twice daily  As needed  Cough/congestion  Fluids and rest  Continue on oxygen .  Chest xray today  Follow up with Dr. Lake Bells as 3 months and As needed   Please contact office for sooner follow up if symptoms do not improve or worsen or seek emergency care       Interstitial lung disease  ([place]) Appears stable with no increased O2 demands  Continue to monitor .  Recent HRCT Chest w/o sign change/progression   Chronic respiratory failure with hypoxia ([facility]) Cont on O2      Tammy Parrett, NP 07/14/2017

## 2017-07-14 NOTE — Progress Notes (Signed)
LMOMTCB x 1 

## 2017-07-14 NOTE — Patient Instructions (Signed)
Augmentin 875 Twice daily  For 1 week  Mucinex DM Twice daily  As needed  Cough/congestion  Fluids and rest  Continue on oxygen .  Chest xray today  Follow up with Dr. Lake Bells as 3 months and As needed   Please contact office for sooner follow up if symptoms do not improve or worsen or seek emergency care

## 2017-07-14 NOTE — Assessment & Plan Note (Signed)
Slow to resolve exacerbation -despite abx x 1 week .  Check cxr   Plan  Patient Instructions  Augmentin 875 Twice daily  For 1 week  Mucinex DM Twice daily  As needed  Cough/congestion  Fluids and rest  Continue on oxygen .  Chest xray today  Follow up with Dr. Lake Bells as 3 months and As needed   Please contact office for sooner follow up if symptoms do not improve or worsen or seek emergency care

## 2017-07-18 ENCOUNTER — Telehealth: Payer: Self-pay | Admitting: Adult Health

## 2017-07-18 NOTE — Telephone Encounter (Signed)
Patient is aware of results nothing further needed.

## 2017-07-21 DIAGNOSIS — D692 Other nonthrombocytopenic purpura: Secondary | ICD-10-CM | POA: Diagnosis not present

## 2017-07-21 DIAGNOSIS — L570 Actinic keratosis: Secondary | ICD-10-CM | POA: Diagnosis not present

## 2017-07-21 DIAGNOSIS — L814 Other melanin hyperpigmentation: Secondary | ICD-10-CM | POA: Diagnosis not present

## 2017-07-21 DIAGNOSIS — D1801 Hemangioma of skin and subcutaneous tissue: Secondary | ICD-10-CM | POA: Diagnosis not present

## 2017-07-21 DIAGNOSIS — L84 Corns and callosities: Secondary | ICD-10-CM | POA: Diagnosis not present

## 2017-07-21 DIAGNOSIS — D225 Melanocytic nevi of trunk: Secondary | ICD-10-CM | POA: Diagnosis not present

## 2017-07-21 DIAGNOSIS — L821 Other seborrheic keratosis: Secondary | ICD-10-CM | POA: Diagnosis not present

## 2017-09-26 ENCOUNTER — Encounter: Payer: Self-pay | Admitting: Adult Health

## 2017-09-26 ENCOUNTER — Ambulatory Visit (INDEPENDENT_AMBULATORY_CARE_PROVIDER_SITE_OTHER): Payer: Medicare Other | Admitting: Adult Health

## 2017-09-26 DIAGNOSIS — J849 Interstitial pulmonary disease, unspecified: Secondary | ICD-10-CM

## 2017-09-26 DIAGNOSIS — J479 Bronchiectasis, uncomplicated: Secondary | ICD-10-CM

## 2017-09-26 DIAGNOSIS — J209 Acute bronchitis, unspecified: Secondary | ICD-10-CM | POA: Diagnosis not present

## 2017-09-26 MED ORDER — AMOXICILLIN-POT CLAVULANATE 875-125 MG PO TABS: 1.0000 | ORAL_TABLET | Freq: Two times a day (BID) | ORAL | 0 refills | Status: AC

## 2017-09-26 NOTE — Progress Notes (Signed)
_0  ID: [name], female    DOB: February 17, 1941, 77 y.o.   MRN: [id number]  Chief Complaint  Patient presents with  . Acute Visit    ILD     Referring provider: Lawerance Cruel, MD  HPI: 77 yo female never smoker followed for interstitial pneumonitis (Lung bx 11/2014) . 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  PFT's January 2016: FVC 2.13 (66%), no obstruction, TLC 4.00 (72%), DLCO 13.70 (48%) March 2017 pulmonary function testing ratio 88%, FVC 2.03 L (84% predicted), total lung capacity 3.0 L (54% predicted), DLCO 14.7 (51% predicted) April 2018 ratio 89%, FVC 1.91 L 61% predicted, total lung capacity 3.49 L 63% predicted, DLCO 12.69 44% predicted November 2018 pulmonary function testing ratio 90%, FVC 1.72 L 55% predicted, total lung capacity 3.37 L 61% predicted, DLCO 11.2 39% predicted  6MW 02/2015 6 Min Walk: 1218 feet. The patient's lowest oxygen saturation was 90 %, highest heart rate was 118 bpm , and highest blood pressure was 180/80 07/2016 6 MW> 1320 feet 3 L O2 88%  Chest imaging: CT chest 2008: Mild bronchiectasis, ?NSIP: CXR 05/2014: Progressive IS changes compared to 2012. HRCT 04/2017: traction bronchiectasis and bronchovascular and peripheral fibrotic changes, worse in the upper lobes, some air trapping, no significant progression compared to 09/2016  HRCT 2016: Changes c/w fibrotic NSIP? 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  Patient complains of 3 weeks of cough, congestion with thick yellow mucus, sinus drainage.  Says that she has been using her Claritin and Flonase without much relief.  She remains on Pulmicort nebulizer twice daily.  She remains on oxygen 3 L.  She denies any chest pain, fever, orthopnea, increased edema. Appetite is remained good with no nausea vomiting or diarrhea.  Allergies  Allergen Reactions  . Adhesive [Tape] Itching    Rash  . Doxycycline Rash  . Sulfonamide Derivatives Other (See Comments)    Unknown    Immunization History  Administered Date(s) Administered  . Influenza, High Dose Seasonal PF 04/20/2016, 02/23/2017  . Influenza-Unspecified 03/06/2014, 02/18/2015  . Pneumococcal Conjugate-13 03/06/2014    Past Medical History:  Diagnosis Date  . Allergic rhinitis    takes Claritin nightly  . Anxiety    but not on any meds  . Arthritis   . Asthma   . Carpal tunnel syndrome   . GERD (gastroesophageal reflux disease)   . History of bronchitis 3-25yr ago  . History of colon polyps    benign  . Joint pain   . Peripheral edema    takes Lasix daily  . Shortness of breath dyspnea    with exertion;Albuterol inhaler as needed  . Thyroid nodule     Tobacco History: Social History   Tobacco Use  Smoking Status Never Smoker  Smokeless Tobacco Never Used   Counseling given: Not Answered   Outpatient Encounter Medications as of 09/26/2017  Medication Sig  . albuterol (PROVENTIL HFA;VENTOLIN  HFA) 108 (90 BASE) MCG/ACT inhaler Inhale 2 puffs into the lungs every 6 (six) hours as needed for wheezing or shortness of breath.   Marland Kitchen albuterol (PROVENTIL) (2.5 MG/3ML) 0.083% nebulizer solution Take 3 mLs (2.5 mg total) by nebulization every 6 (six) hours as needed for wheezing or shortness of breath.  Marland Kitchen amoxicillin-clavulanate (AUGMENTIN) 875-125 MG tablet Take 1 tablet by mouth 2 (two) times daily for 7 days.    Marland Kitchen aspirin 325 MG tablet Take 650 mg by mouth every 6 (six) hours as needed for mild pain or moderate pain.   . budesonide (PULMICORT) 0.5 MG/2ML nebulizer solution Take 2 mLs (0.5 mg total) by nebulization 2 (two) times daily.  . fluticasone (FLONASE) 50 MCG/ACT nasal spray Place 1 spray into both nostrils daily.  . furosemide (LASIX) 40 MG tablet TAKE 1 TABLET(40 MG) BY MOUTH DAILY  . ibuprofen (ADVIL,MOTRIN) 200 MG tablet Take 400 mg by mouth every 6 (six) hours as needed for mild pain or moderate pain.   Marland Kitchen loratadine (CLARITIN) 5 MG chewable tablet Chew 5 mg by mouth daily as needed for allergies.  . [DISCONTINUED] amoxicillin-clavulanate (AUGMENTIN) 875-125 MG tablet Take 1 tablet by mouth 2 (two) times daily. (Patient not taking: Reported on 07/14/2017)   No facility-administered encounter medications on file as of 09/26/2017.      Review of Systems  Constitutional:   No  weight loss, night sweats,  Fevers, chills, + fatigue, or  lassitude.  HEENT:   No headaches,  Difficulty swallowing,  Tooth/dental problems, or  Sore throat,                No sneezing, itching, ear ache,  +nasal congestion, post nasal drip,   CV:  No chest pain,  Orthopnea, PND, swelling in lower extremities, anasarca, dizziness, palpitations, syncope.   GI  No heartburn, indigestion, abdominal pain, nausea, vomiting, diarrhea, change in bowel habits, loss of appetite, bloody stools.   Resp:   No chest wall deformity  Skin: no rash or lesions.  GU: no dysuria, change in color of urine, no urgency or frequency.  No flank pain, no hematuria   MS:  No joint pain or swelling.  No decreased range of motion.  No back pain.    Physical Exam  BP (!) 142/72 (BP Location: Left Arm, Cuff Size: Large)   Pulse 94   SpO2 95%   GEN: A/Ox3; pleasant , NAD, elderly , obese    HEENT:  [place]/AT,  EACs-clear, TMs-wnl, NOSE-clear drainage , THROAT-clear, no lesions, no postnasal drip or exudate noted.   NECK:  Supple w/  fair ROM; no JVD; normal carotid impulses w/o bruits; no thyromegaly or nodules palpated; no lymphadenopathy.    RESP few trace rhonchi with bibasilar crackles  no accessory muscle use, no dullness to percussion  CARD:  RRR, no m/r/g, no peripheral edema, pulses intact, no cyanosis or clubbing.  GI:   Soft & nt; nml bowel sounds; no organomegaly or masses detected.   Musco: Warm bil, no deformities or joint swelling noted.   Neuro: alert, no focal deficits noted.    Skin: Warm, no lesions or rashes    Lab Results:  CBC  BNP  Imaging: No results found.   Assessment & Plan:   Acute bronchitis Flare   Plan  Patient Instructions  Augmentin 875 Twice daily  For 1 week  Mucinex DM Twice daily  As needed  Cough/congestion  Continue on Pulmicort Neb Twice daily  .  Fluids  and rest  Continue on Oxygen 3l/m .  Follow up with Dr. Lake Bells as planned and As needed   Please contact office for sooner follow up if symptoms do not improve or worsen or seek emergency care       Interstitial lung disease ([place]) Mild flare with bronchitis   Plan  Patient Instructions  Augmentin 875 Twice daily  For 1 week  Mucinex DM Twice daily  As needed  Cough/congestion  Continue on Pulmicort Neb Twice daily  .  Fluids and rest  Continue on Oxygen 3l/m .  Follow up with Dr. Lake Bells as planned and As needed   Please contact office for sooner follow up if symptoms do not improve or worsen or seek emergency care          Rexene Edison, NP 09/26/2017

## 2017-09-26 NOTE — Patient Instructions (Addendum)
Augmentin 875 Twice daily  For 1 week  Mucinex DM Twice daily  As needed  Cough/congestion  Continue on Pulmicort Neb Twice daily  .  Fluids and rest  Continue on Oxygen 3l/m .  Follow up with Dr. Lake Bells as planned and As needed   Please contact office for sooner follow up if symptoms do not improve or worsen or seek emergency care

## 2017-09-26 NOTE — Assessment & Plan Note (Signed)
Mild flare with bronchitis   Plan  Patient Instructions  Augmentin 875 Twice daily  For 1 week  Mucinex DM Twice daily  As needed  Cough/congestion  Continue on Pulmicort Neb Twice daily  .  Fluids and rest  Continue on Oxygen 3l/m .  Follow up with Dr. Lake Bells as planned and As needed   Please contact office for sooner follow up if symptoms do not improve or worsen or seek emergency care

## 2017-09-26 NOTE — Assessment & Plan Note (Signed)
Flare   Plan  Patient Instructions  Augmentin 875 Twice daily  For 1 week  Mucinex DM Twice daily  As needed  Cough/congestion  Continue on Pulmicort Neb Twice daily  .  Fluids and rest  Continue on Oxygen 3l/m .  Follow up with Dr. Lake Bells as planned and As needed   Please contact office for sooner follow up if symptoms do not improve or worsen or seek emergency care

## 2017-09-26 NOTE — Assessment & Plan Note (Deleted)
Flare   Plan  Patient Instructions  Augmentin 875 Twice daily  For 1 week  Mucinex DM Twice daily  As needed  Cough/congestion  Continue on Pulmicort Neb Twice daily  .  Fluids and rest  Continue on Oxygen 3l/m .  Follow up with Dr. Lake Bells as planned and As needed   Please contact office for sooner follow up if symptoms do not improve or worsen or seek emergency care

## 2017-10-02 ENCOUNTER — Telehealth: Payer: Self-pay | Admitting: Adult Health

## 2017-10-02 MED ORDER — AMOXICILLIN-POT CLAVULANATE 875-125 MG PO TABS: 1.0000 | ORAL_TABLET | Freq: Two times a day (BID) | ORAL | 0 refills | Status: DC

## 2017-10-02 NOTE — Telephone Encounter (Signed)
Called and spoke with pt letting her know we were going to extend the augmentin x3days.  Pt expressed understanding. rx sent to pt's pharmacy. Nothing further needed at this time.

## 2017-10-02 NOTE — Telephone Encounter (Signed)
Per TP: may extend the Augmentin for another 3 days >> Augmentin 864m #6 1po BID.  Continue Mucinex DM twice daily as needed for cough/congestion, Pulmicort neb twice daily, Oxygen 3lpm.  Please call the office if symptoms do not improve or worsen or seek emergency attention.  Thanks.

## 2017-10-02 NOTE — Telephone Encounter (Signed)
Called and spoke with patient, she is feeling better but still has the productive cough with yellow mucus. Patient is on her last pill. She is requesting a few more days.    TP please advise, thanks.

## 2017-10-05 NOTE — Progress Notes (Signed)
Reviewed, agree

## 2017-10-11 ENCOUNTER — Ambulatory Visit: Payer: Self-pay | Admitting: Pulmonary Disease

## 2017-10-12 ENCOUNTER — Encounter: Payer: Self-pay | Admitting: Pulmonary Disease

## 2017-10-12 ENCOUNTER — Ambulatory Visit (INDEPENDENT_AMBULATORY_CARE_PROVIDER_SITE_OTHER): Payer: Medicare Other | Admitting: Pulmonary Disease

## 2017-10-12 VITALS — BP 136/76 | HR 85 | Ht 67.0 in | Wt 224.2 lb

## 2017-10-12 DIAGNOSIS — J479 Bronchiectasis, uncomplicated: Secondary | ICD-10-CM

## 2017-10-12 DIAGNOSIS — G4719 Other hypersomnia: Secondary | ICD-10-CM | POA: Diagnosis not present

## 2017-10-12 DIAGNOSIS — J849 Interstitial pulmonary disease, unspecified: Secondary | ICD-10-CM | POA: Diagnosis not present

## 2017-10-12 DIAGNOSIS — J9611 Chronic respiratory failure with hypoxia: Secondary | ICD-10-CM | POA: Diagnosis not present

## 2017-10-12 MED ORDER — MONTELUKAST SODIUM 10 MG PO TABS: 10.0000 mg | ORAL_TABLET | Freq: Every day | ORAL | 2 refills | Status: DC

## 2017-10-12 NOTE — Progress Notes (Signed)
Subjective:    Patient ID: [name], female    DOB: 04-18-1941, 77 y.o.   MRN: [id number]  Synopsis: Former patient of Dr. 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 She was seen at [facility] for a second opinion and they felt that she had hypersensitivity pneumonitis, chronic form.  She also has a history of rheumatoid arthritis.     HPI Chief Complaint  Patient presents with  . Follow-up    82morov- prod cough with clear to yellow mucus, wheezing & sob with exertion   Hideko says that the antibiotics helped her.  She is still coughing up yellow mucus. She still has some pain in her right shoulder from time to time and has limited mobility there.    She is still using and benefiting from her oxygen.    She is still run down.  She is tired all the time.  She feels like she needs her oxygen at night.  She takes it off a lot.  She falls asleep in the daytime frequently.   Past Medical History:  Diagnosis Date  . Allergic rhinitis    takes Claritin nightly  . Anxiety    but not on any meds  . Arthritis   . Asthma   . Carpal tunnel syndrome   . GERD (gastroesophageal reflux disease)   . History of bronchitis 3-441yrago  . History of colon polyps    benign  . Joint pain   . Peripheral edema    takes Lasix daily  . Shortness of breath dyspnea    with exertion;Albuterol inhaler as needed  . Thyroid nodule       Review of Systems  Constitutional: Negative for chills, fatigue and fever.  HENT: Negative for postnasal drip, rhinorrhea and sinus pressure.   Respiratory: Negative for cough, shortness of breath and wheezing.   Cardiovascular: Negative for chest pain, palpitations and leg swelling.       Objective:   Physical Exam Vitals:   10/12/17 1134  BP: 136/76  Pulse: 85  SpO2: 97%  Weight: 224 lb 3.2 oz (101.7 kg)  Height: _0  (1.702 m)   3L pulse flow  Gen:  well appearing HENT: OP clear, TM's clear, neck supple PULM: Crackles bases B, normal percussion CV: RRR, no mgr, trace edema GI: BS+, soft, nontender Derm: no cyanosis or rash Psyche: normal mood and affect     CBC    Component Value Date/Time   WBC 10.2 04/19/2017 1617   RBC 4.52 04/19/2017 1617   HGB 13.3 04/19/2017 1617   HCT 41.0 04/19/2017 1617   PLT 377.0 04/19/2017 1617   MCV 90.7 04/19/2017 1617   MCH 29.3 06/18/2016 0507   MCHC 32.4 04/19/2017 1617   RDW 14.0 04/19/2017 1617   LYMPHSABS 3.5 04/19/2017 1617   MONOABS 0.9 04/19/2017 1617   EOSABS 0.7 04/19/2017 1617   BASOSABS 0.1 04/19/2017 1617   Imaging: 06/2016 CT angiogram> no PE, superimposed ground glass changes in addition to baseline pulmonary fibrosis  Pulmonary function test: PFT's 2009:  FEV1 1.97 (84%),ratio 70, 22% increase FEV1 with BD, TLC 4.44 (82%), DLCO 16.7 (62%) 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  PFT's January 2016:  FVC 2.13 (66%), no obstruction, TLC 4.00 (72%), DLCO 13.70 (48%) March 2017 pulmonary function testing ratio 88%, FVC 2.03  L (84% predicted), total lung capacity 3.0 L (54% predicted), DLCO 14.7 (51% predicted) April 2018 ratio 89%, FVC 1.91 L 61% predicted, total lung capacity 3.49 L 63% predicted, DLCO 12.69 44% predicted November 2018 pulmonary function testing ratio 90%, FVC 1.72 L 55% predicted, total lung capacity 3.37 L 61% predicted, DLCO 11.2 39% predicted  6MW 02/2015 6 Min Walk: 1218 feet. The patient's lowest oxygen saturation was 90 %, highest heart rate was 118 bpm , and highest blood pressure was 180/80 07/2016 6 MW> 1320 feet 3 L O2 88%  Chest imaging: CT chest 2008:  Mild bronchiectasis, ?NSIP: CXR 05/2014:  Progressive IS changes compared to 2012. HRCT 04/2017: traction bronchiectasis and bronchovascular and peripheral fibrotic changes, worse in the upper lobes, some  air trapping, no significant progression compared to 09/2016  HRCT 2016:  Changes c/w fibrotic NSIP?  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  She is producing a lot of mucus from allergic rhinitis so I think this is contributing.  At this point she does not seem to be infected.  She is still complaining of significant daytime fatigue and somnolence.  I have a strong suspicion of obstructive sleep apnea.  Daytime sleepiness: We will arrange for a sleep study  Chronic respiratory failure with hypoxemia  Continue 3 L of oxygen continuously  Interstitial lung disease: Chronic hypersensitivity pneumonitis/bronchiectasis: Continue to use budesonide twice a day Continue to use albuterol twice a day If you have a fever or worsening shortness of breath call me right away  Rheumatoid arthritis: Continue follow-up with Dr. Lenna Gilford, I am happy to talk to her about your care.  Follow-up with me in 8 weeks or sooner if needed  20 min spent with patient, 27 minute visit   Current Outpatient Medications:  .  albuterol (PROVENTIL HFA;VENTOLIN HFA) 108 (90 BASE) MCG/ACT inhaler, Inhale 2 puffs into the lungs every 6 (six) hours as needed for wheezing or shortness of breath. , Disp: , Rfl:  .  albuterol (PROVENTIL) (2.5 MG/3ML) 0.083% nebulizer solution, Take 3 mLs (2.5 mg total) by nebulization every 6 (six) hours as needed for wheezing or shortness of breath., Disp: 360 mL, Rfl: 5 .   aspirin 325 MG tablet, Take 650 mg by mouth every 6 (six) hours as needed for mild pain or moderate pain. , Disp: , Rfl:  .  budesonide (PULMICORT) 0.5 MG/2ML nebulizer solution, Take 2 mLs (0.5 mg total) by nebulization 2 (two) times daily., Disp: 120 mL, Rfl: 12 .  fluticasone (FLONASE) 50 MCG/ACT nasal spray, Place 1 spray into both nostrils daily., Disp: , Rfl:  .  furosemide (LASIX) 40 MG tablet, TAKE 1 TABLET(40 MG) BY MOUTH DAILY, Disp: 90 tablet, Rfl: 0 .  ibuprofen (ADVIL,MOTRIN) 200 MG tablet, Take 400 mg by mouth every 6 (six) hours as needed for mild pain or moderate pain. , Disp: , Rfl:  .  loratadine (CLARITIN) 5 MG chewable tablet, Chew 5 mg by mouth daily as needed for allergies., Disp: ,  Rfl:  .  montelukast (SINGULAIR) 10 MG tablet, Take 1 tablet (10 mg total) by mouth at bedtime., Disp: 30 tablet, Rfl: 2

## 2017-10-12 NOTE — Patient Instructions (Signed)
Daytime sleepiness: We will arrange for a sleep study  Chronic respiratory failure with hypoxemia  Continue 3 L of oxygen continuously  Interstitial lung disease: Chronic hypersensitivity pneumonitis/bronchiectasis: Continue to use budesonide twice a day Continue to use albuterol twice a day If you have a fever or worsening shortness of breath call me right away  Rheumatoid arthritis: Continue follow-up with Dr. Lenna Gilford, I am happy to talk to her about your care.  Follow-up with me in 8 weeks or sooner if needed

## 2017-10-16 DIAGNOSIS — J849 Interstitial pulmonary disease, unspecified: Secondary | ICD-10-CM | POA: Diagnosis not present

## 2017-10-16 DIAGNOSIS — M2550 Pain in unspecified joint: Secondary | ICD-10-CM | POA: Diagnosis not present

## 2017-10-16 DIAGNOSIS — E669 Obesity, unspecified: Secondary | ICD-10-CM | POA: Diagnosis not present

## 2017-10-16 DIAGNOSIS — Z6835 Body mass index (BMI) 35.0-35.9, adult: Secondary | ICD-10-CM | POA: Diagnosis not present

## 2017-10-16 DIAGNOSIS — M150 Primary generalized (osteo)arthritis: Secondary | ICD-10-CM | POA: Diagnosis not present

## 2017-11-01 ENCOUNTER — Encounter: Payer: Self-pay | Admitting: Pulmonary Disease

## 2017-11-01 ENCOUNTER — Telehealth: Payer: Self-pay | Admitting: Pulmonary Disease

## 2017-11-01 NOTE — Telephone Encounter (Signed)
Error  

## 2017-11-01 NOTE — Telephone Encounter (Signed)
Spoke with pt's son, Simona Huh. He states the pt was told by airlines that the pt will need additional information when boarding a plane with oxygen. I asked Simona Huh what information, exact, they need from [place] or the pt. Simona Huh asked the airline and they stated they are currently handling it and do not need anything from our office at this time. I informed Simona Huh to call back if there are any issues. Simona Huh verbalized understanding and denied any further questions or concerns at this time.

## 2017-11-14 ENCOUNTER — Ambulatory Visit (INDEPENDENT_AMBULATORY_CARE_PROVIDER_SITE_OTHER): Payer: Medicare Other | Admitting: Pulmonary Disease

## 2017-11-14 ENCOUNTER — Encounter: Payer: Self-pay | Admitting: Pulmonary Disease

## 2017-11-14 VITALS — BP 150/82 | HR 80 | Ht 67.0 in | Wt 222.0 lb

## 2017-11-14 DIAGNOSIS — J329 Chronic sinusitis, unspecified: Secondary | ICD-10-CM | POA: Diagnosis not present

## 2017-11-14 MED ORDER — AMOXICILLIN-POT CLAVULANATE 875-125 MG PO TABS: 1.0000 | ORAL_TABLET | Freq: Two times a day (BID) | ORAL | 0 refills | Status: DC

## 2017-11-14 NOTE — Progress Notes (Signed)
Subjective:    Patient ID: [name], female    DOB: 1940-07-30, 77 y.o.   MRN: [id number]  Synopsis: Former patient of Dr. 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 She was seen at [facility] for a second opinion and they felt that she had hypersensitivity pneumonitis, chronic form.  She also has a history of rheumatoid arthritis.     HPI Chief Complaint  Patient presents with  . Follow-up    ROV, increased SOB   [name] says she had a hard time breathing after she returned from [place].  She actually breathed well while in [place], but the plane trip back and once she arrived in [place] she had a hard time breathing.    She feels congestion in her throat, she says her voice is weak.  She is coughing a lot and she is short of breath but not much more than baseline.  No recent fevers or chills.  No ankle swelling no leg pain  Past Medical History:  Diagnosis Date  . Allergic rhinitis    takes Claritin nightly  . Anxiety    but not on any meds  . Arthritis   . Asthma   . Carpal tunnel syndrome   . GERD (gastroesophageal reflux disease)   . History of bronchitis 3-33yr ago  . History of colon polyps    benign  . Joint pain   . Peripheral edema    takes Lasix daily  . Shortness of breath dyspnea    with exertion;Albuterol inhaler as needed  . Thyroid nodule       Review of Systems  Constitutional: Negative for chills, fatigue and fever.  HENT: Negative for postnasal drip, rhinorrhea and sinus pressure.   Respiratory: Negative for cough, shortness of breath and wheezing.   Cardiovascular: Negative for chest pain, palpitations and leg swelling.       Objective:   Physical Exam Vitals:   11/14/17 1433  BP: (!) 150/82  Pulse: 80  SpO2: 98%  Weight: 222 lb (100.7 kg)  Height: _0  (1.702 m)   3L pulse flow  Gen: well appearing HENT: OP clear, neck supple PULM:  Crackles bases, no wheezing B, normal percussion CV: RRR, no mgr, trace edema GI: BS+, soft, nontender Derm: no cyanosis or rash Psyche: normal mood and affect     CBC    Component Value Date/Time   WBC 10.2 04/19/2017 1617   RBC 4.52 04/19/2017 1617   HGB 13.3 04/19/2017 1617   HCT 41.0 04/19/2017 1617   PLT 377.0 04/19/2017 1617   MCV 90.7 04/19/2017 1617   MCH 29.3 06/18/2016 0507   MCHC 32.4 04/19/2017 1617   RDW 14.0 04/19/2017 1617   LYMPHSABS 3.5 04/19/2017 1617   MONOABS 0.9 04/19/2017 1617   EOSABS 0.7 04/19/2017 1617   BASOSABS 0.1 04/19/2017 1617   Imaging: 06/2016 CT angiogram> no PE, superimposed ground glass changes in addition to baseline pulmonary fibrosis  Pulmonary function test: PFT's 2009:  FEV1 1.97 (84%),ratio 70, 22% increase FEV1 with BD, TLC 4.44 (82%), DLCO 16.7 (62%) 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  PFT's January 2016:  FVC 2.13 (66%), no obstruction, TLC 4.00 (72%), DLCO 13.70 (48%) March 2017 pulmonary function testing ratio 88%, FVC 2.03 L (84% predicted), total lung capacity 3.0 L (54% predicted), DLCO 14.7 (51% predicted)  April 2018 ratio 89%, FVC 1.91 L 61% predicted, total lung capacity 3.49 L 63% predicted, DLCO 12.69 44% predicted November 2018 pulmonary function testing ratio 90%, FVC 1.72 L 55% predicted, total lung capacity 3.37 L 61% predicted, DLCO 11.2 39% predicted  6MW 02/2015 6 Min Walk: 1218 feet. The patient's lowest oxygen saturation was 90 %, highest heart rate was 118 bpm , and highest blood pressure was 180/80 07/2016 6 MW> 1320 feet 3 L O2 88%  Chest imaging: CT chest 2008:  Mild bronchiectasis, ?NSIP: CXR 05/2014:  Progressive IS changes compared to 2012. HRCT 04/2017: traction bronchiectasis and bronchovascular and peripheral fibrotic changes, worse in the upper lobes, some air trapping, no significant progression compared  to 09/2016  HRCT 2016:  Changes c/w fibrotic NSIP?  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   Discussion: [name] is having another flareup of sinusitis.  This is the third or fourth in the last 6 to 12 months.  She is compliant with her allergic rhinitis treatment.  I think we need to get a high-resolution CT scan of her sinuses and then consider getting a sinus culture from the ear nose and throat.  From a respiratory standpoint I see no evidence that her underlying lung disease has worsened.  Plan: Chronic hypersensitivity pneumonitis with bronchiectasis: Continue Pulmicort nebulized twice a day Continue albuterol nebulized twice a day and as needed for chest tightness wheezing or shortness of breath  Allergic rhinitis: Continue Claritin and Flonase  Acute sinusitis: Take Augmentin 875 twice daily for 1 week We will obtain a CT scan of your sinuses and then will refer you to Dr. Radene Smith with ear nose and throat for consideration of sinus cultures  Chronic respiratory failure with hypoxemia: Keep using 3 L of oxygen continuously  Follow-up with me in 6 weeks    Current Outpatient Medications:  .  albuterol (PROVENTIL HFA;VENTOLIN HFA) 108 (90 BASE) MCG/ACT inhaler, Inhale 2 puffs into the lungs every 6 (six) hours as needed for wheezing or shortness of breath. , Disp: , Rfl:  .  albuterol (PROVENTIL) (2.5 MG/3ML) 0.083% nebulizer solution, Take 3 mLs (2.5 mg total) by nebulization every 6 (six) hours as needed for wheezing or shortness of breath., Disp: 360 mL, Rfl: 5 .  aspirin 325 MG tablet, Take 650 mg by mouth every 6 (six) hours as needed for mild pain or moderate pain. , Disp: , Rfl:  .  budesonide (PULMICORT) 0.5 MG/2ML  nebulizer solution, Take 2 mLs (0.5 mg total) by nebulization 2 (two) times daily., Disp: 120 mL, Rfl: 12 .  fluticasone (FLONASE) 50 MCG/ACT nasal spray, Place 1 spray into both nostrils daily., Disp: , Rfl:  .  furosemide (LASIX) 40 MG tablet, TAKE 1 TABLET(40 MG) BY MOUTH DAILY, Disp: 90 tablet, Rfl: 0 .  ibuprofen (ADVIL,MOTRIN) 200 MG tablet, Take 400 mg by mouth every 6 (six) hours as needed for mild pain or moderate pain. , Disp: , Rfl:  .  loratadine (CLARITIN) 5 MG chewable tablet, Chew 5 mg by mouth daily as needed for allergies., Disp: , Rfl:  .  montelukast (SINGULAIR) 10 MG tablet, Take 1 tablet (10 mg total) by mouth at bedtime., Disp: 30 tablet, Rfl: 2 .  predniSONE (DELTASONE) 10 MG tablet, Take 10 mg by mouth daily with breakfast., Disp: , Rfl:

## 2017-11-14 NOTE — Patient Instructions (Signed)
Chronic hypersensitivity pneumonitis with bronchiectasis: Continue Pulmicort nebulized twice a day Continue albuterol nebulized twice a day and as needed for chest tightness wheezing or shortness of breath  Allergic rhinitis: Continue Claritin and Flonase  Acute sinusitis: Take Augmentin 875 twice daily for 1 week We will obtain a CT scan of your sinuses and then will refer you to Dr. Radene Journey with ear nose and throat for consideration of sinus cultures  Chronic respiratory failure with hypoxemia: Keep using 3 L of oxygen continuously  Follow-up with me in 6 weeks

## 2017-11-17 ENCOUNTER — Encounter (HOSPITAL_BASED_OUTPATIENT_CLINIC_OR_DEPARTMENT_OTHER): Payer: Self-pay

## 2017-11-27 ENCOUNTER — Ambulatory Visit (INDEPENDENT_AMBULATORY_CARE_PROVIDER_SITE_OTHER)
Admission: RE | Admit: 2017-11-27 | Discharge: 2017-11-27 | Disposition: A | Discharge status: Home / Self Care | Payer: Medicare Other | Source: Ambulatory Visit | Attending: Pulmonary Disease | Admitting: Pulmonary Disease

## 2017-11-27 DIAGNOSIS — J329 Chronic sinusitis, unspecified: Secondary | ICD-10-CM | POA: Diagnosis not present

## 2017-11-27 DIAGNOSIS — R0989 Other specified symptoms and signs involving the circulatory and respiratory systems: Secondary | ICD-10-CM | POA: Diagnosis not present

## 2017-11-28 DIAGNOSIS — J3501 Chronic tonsillitis: Secondary | ICD-10-CM | POA: Diagnosis not present

## 2017-12-08 ENCOUNTER — Ambulatory Visit: Payer: Self-pay | Admitting: Adult Health

## 2017-12-12 ENCOUNTER — Ambulatory Visit: Payer: Self-pay | Admitting: Pulmonary Disease

## 2017-12-28 ENCOUNTER — Encounter: Payer: Self-pay | Admitting: Pulmonary Disease

## 2017-12-28 ENCOUNTER — Ambulatory Visit (INDEPENDENT_AMBULATORY_CARE_PROVIDER_SITE_OTHER): Payer: Medicare Other | Admitting: Pulmonary Disease

## 2017-12-28 VITALS — Wt 226.2 lb

## 2017-12-28 DIAGNOSIS — J849 Interstitial pulmonary disease, unspecified: Secondary | ICD-10-CM | POA: Diagnosis not present

## 2017-12-28 DIAGNOSIS — J9611 Chronic respiratory failure with hypoxia: Secondary | ICD-10-CM

## 2017-12-28 DIAGNOSIS — J329 Chronic sinusitis, unspecified: Secondary | ICD-10-CM

## 2017-12-28 DIAGNOSIS — J471 Bronchiectasis with (acute) exacerbation: Secondary | ICD-10-CM

## 2017-12-28 DIAGNOSIS — R05 Cough: Secondary | ICD-10-CM | POA: Diagnosis not present

## 2017-12-28 DIAGNOSIS — R059 Cough, unspecified: Secondary | ICD-10-CM

## 2017-12-28 MED ORDER — AMOXICILLIN-POT CLAVULANATE 875-125 MG PO TABS: 1.0000 | ORAL_TABLET | Freq: Two times a day (BID) | ORAL | 0 refills | Status: DC

## 2017-12-28 MED ORDER — SODIUM CHLORIDE 3 % IN NEBU: INHALATION_SOLUTION | Freq: Two times a day (BID) | RESPIRATORY_TRACT | 12 refills | Status: DC

## 2017-12-28 MED ORDER — FLUTTER DEVI: 0 refills | Status: DC

## 2017-12-28 NOTE — Progress Notes (Signed)
Subjective:    Patient ID: [name], female    DOB: 03/10/1941, 77 y.o.   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 She was seen at [facility] for a second opinion and they felt that she had hypersensitivity pneumonitis, chronic form.  She also has a history of rheumatoid arthritis.     HPI Chief Complaint  Patient presents with  . Follow-up    CT sinus 11/27/17- Increased SOB and wheezing x 2 weeks.   [name] is feeling a bit worse again.  She says that after she took the antibiotic's last time she started to have improved chest congestion and her breathing had improved a bit.  However, for the last 10 days she has been having increasing mucus production.  It yellow in color.  There is some associated wheezing and shortness of breath as well.  She is compliant with her Pulmicort twice a day.  She has not used a flutter valve recently.  She says that she went to go see the ear nose and throat physician and her sinuses were clear.  She continues to use and benefit from 3 L of oxygen continuously.  Past Medical History:  Diagnosis Date  . Allergic rhinitis    takes Claritin nightly  . Anxiety    but not on any meds  . Arthritis   . Asthma   . Carpal tunnel syndrome   . GERD (gastroesophageal reflux disease)   . History of bronchitis 3-88yr ago  . History of colon polyps    benign  . Joint pain   . Peripheral edema    takes Lasix daily  . Shortness of breath dyspnea    with exertion;Albuterol inhaler as needed  . Thyroid nodule       Review of Systems  Constitutional: Negative for chills, fatigue and fever.  HENT: Negative for postnasal drip, rhinorrhea and sinus pressure.   Respiratory: Negative for cough, shortness of breath and wheezing.   Cardiovascular: Negative for chest pain, palpitations and leg swelling.         Objective:   Physical Exam Vitals:   12/28/17 1143  BP: (P) 140/68  Pulse: (P) 83  SpO2: (P) 94%  Weight: 226 lb 3.2 oz (102.6 kg)   3L pulse flow  Gen: well appearing HENT: OP clear, TM's clear, neck supple PULM: Crackles bases B, normal percussion CV: RRR, no mgr, trace edema GI: BS+, soft, nontender Derm: no cyanosis or rash Psyche: normal mood and affect      CBC    Component Value Date/Time   WBC 10.2 04/19/2017 1617   RBC 4.52 04/19/2017 1617   HGB 13.3 04/19/2017 1617   HCT 41.0 04/19/2017 1617   PLT 377.0 04/19/2017 1617   MCV 90.7 04/19/2017 1617   MCH 29.3 06/18/2016 0507   MCHC 32.4 04/19/2017 1617   RDW 14.0 04/19/2017 1617   LYMPHSABS 3.5 04/19/2017 1617   MONOABS 0.9 04/19/2017 1617   EOSABS 0.7 04/19/2017 1617   BASOSABS 0.1 04/19/2017 1617   Imaging: 06/2016 CT angiogram> no PE, superimposed ground glass changes in addition to baseline pulmonary fibrosis  Pulmonary function test: PFT's 2009:  FEV1 1.97 (84%),ratio 70, 22% increase FEV1 with BD, TLC 4.44 (82%), DLCO 16.7 (62%) 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  PFT's January 2016:  FVC 2.13 (66%), no obstruction, TLC 4.00 (72%), DLCO 13.70 (48%) March 2017 pulmonary function testing ratio 88%, FVC 2.03 L (84% predicted), total lung capacity 3.0 L (54% predicted), DLCO 14.7 (51% predicted) April 2018 ratio 89%, FVC 1.91 L 61% predicted, total lung capacity 3.49 L 63% predicted, DLCO 12.69 44% predicted November 2018 pulmonary function testing ratio 90%, FVC 1.72 L 55% predicted, total lung capacity 3.37 L 61% predicted, DLCO 11.2 39% predicted  6MW 02/2015 6 Min Walk: 1218 feet. The patient's lowest oxygen saturation was 90 %, highest heart rate was 118 bpm , and highest blood pressure was 180/80 07/2016 6 MW> 1320 feet 3 L O2 88%  Chest imaging: CT chest 2008:  Mild bronchiectasis, ?NSIP: CXR  05/2014:  Progressive IS changes compared to 2012. HRCT 04/2017: traction bronchiectasis and bronchovascular and peripheral fibrotic changes, worse in the upper lobes, some air trapping, no significant progression compared to 09/2016  HRCT 2016:  Changes c/w fibrotic NSIP?  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  It sounds as if her sinus disease is not as bad as we expected after she saw ENT recently.  I think given the frequency of bronchitis infections we need to focus our care on her bronchiectasis more aggressively.  Plan: Bronchiectasis with acute exacerbation: Continue Pulmicort twice a day Provide me with a sample of your mucus so that I can tested for bacterial, fungal, AFB organisms After you have provided a sample of mucus take Augmentin twice a day for 7 days Start taking hypertonic saline nebulized twice a day Use the flutter valve 4 to 5 breaths, 4-5 times a day Continue to use albuterol as needed for chest tightness wheezing or shortness of breath  Chronic hypersensitivity pneumonitis: Continue Pulmicort twice a day We will plan to repeat a high-resolution CT scan of the chest in November 2019  Chronic respiratory failure with hypoxemia: Continue 3 L of oxygen continuously  Chronic sinusitis: Continue saline rinses to the sinuses as you are doing  Follow-up in 6 to 8 weeks or sooner if needed    Current Outpatient Medications:  .  albuterol (PROVENTIL HFA;VENTOLIN HFA) 108  (90 BASE) MCG/ACT inhaler, Inhale 2 puffs into the lungs every 6 (six) hours as needed for wheezing or shortness of breath. , Disp: , Rfl:  .  albuterol (PROVENTIL) (2.5 MG/3ML) 0.083% nebulizer solution, Take 3 mLs (2.5 mg total) by nebulization every 6 (six) hours as needed for wheezing or shortness of breath., Disp: 360 mL, Rfl: 5 .  aspirin 325 MG tablet, Take 650 mg by mouth every 6 (six) hours as needed for mild pain or moderate pain. , Disp: , Rfl:  .  budesonide (PULMICORT) 0.5 MG/2ML nebulizer solution, Take 2 mLs (0.5 mg total) by nebulization 2 (two) times daily., Disp: 120 mL, Rfl: 12 .  fluticasone (FLONASE) 50 MCG/ACT nasal spray, Place 1 spray into both nostrils daily., Disp: , Rfl:  .  furosemide (LASIX) 20 MG tablet, Take 20 mg by mouth daily., Disp: ,  Rfl:  .  ibuprofen (ADVIL,MOTRIN) 200 MG tablet, Take 400 mg by mouth every 6 (six) hours as needed for mild pain or moderate pain. , Disp: , Rfl:  .  loratadine (CLARITIN) 5 MG chewable tablet, Chew 5 mg by mouth daily as needed for allergies., Disp: , Rfl:  .  amoxicillin-clavulanate (AUGMENTIN) 875-125 MG tablet, Take 1 tablet by mouth 2 (two) times daily., Disp: 14 tablet, Rfl: 0 .  montelukast (SINGULAIR) 10 MG tablet, Take 1 tablet (10 mg total) by mouth at bedtime. (Patient not taking: Reported on 12/28/2017), Disp: 30 tablet, Rfl: 2 .  Respiratory Therapy Supplies (FLUTTER) DEVI, Use as directed., Disp: 1 each, Rfl: 0 .  sodium chloride HYPERTONIC 3 % nebulizer solution, Take by nebulization 2 (two) times daily., Disp: 750 mL, Rfl: 12

## 2017-12-28 NOTE — Patient Instructions (Signed)
Bronchiectasis with acute exacerbation: Continue Pulmicort twice a day Provide me with a sample of your mucus so that I can tested for bacterial, fungal, AFB organisms After you have provided a sample of mucus take Augmentin twice a day for 7 days Start taking hypertonic saline nebulized twice a day Use the flutter valve 4 to 5 breaths, 4-5 times a day Continue to use albuterol as needed for chest tightness wheezing or shortness of breath  Chronic hypersensitivity pneumonitis: Continue Pulmicort twice a day  Chronic respiratory failure with hypoxemia: Continue 3 L of oxygen continuously  Chronic sinusitis: Continue saline rinses to the sinuses as you are doing  Follow-up in 6 to 8 weeks or sooner if needed

## 2017-12-29 ENCOUNTER — Other Ambulatory Visit: Payer: Medicare Other

## 2017-12-29 DIAGNOSIS — R059 Cough, unspecified: Secondary | ICD-10-CM

## 2017-12-29 DIAGNOSIS — R05 Cough: Secondary | ICD-10-CM

## 2018-02-12 ENCOUNTER — Ambulatory Visit (INDEPENDENT_AMBULATORY_CARE_PROVIDER_SITE_OTHER): Payer: Medicare Other | Admitting: Pulmonary Disease

## 2018-02-12 ENCOUNTER — Other Ambulatory Visit (INDEPENDENT_AMBULATORY_CARE_PROVIDER_SITE_OTHER): Payer: Medicare Other

## 2018-02-12 ENCOUNTER — Encounter: Payer: Self-pay | Admitting: Pulmonary Disease

## 2018-02-12 VITALS — BP 122/74 | HR 85 | Ht 62.01 in | Wt 222.0 lb

## 2018-02-12 DIAGNOSIS — J479 Bronchiectasis, uncomplicated: Secondary | ICD-10-CM | POA: Diagnosis not present

## 2018-02-12 DIAGNOSIS — Z23 Encounter for immunization: Secondary | ICD-10-CM

## 2018-02-12 DIAGNOSIS — M791 Myalgia, unspecified site: Secondary | ICD-10-CM

## 2018-02-12 DIAGNOSIS — J849 Interstitial pulmonary disease, unspecified: Secondary | ICD-10-CM | POA: Diagnosis not present

## 2018-02-12 LAB — SEDIMENTATION RATE: Sed Rate: 51 mm/hr — ABNORMAL HIGH (ref 0–30)

## 2018-02-12 NOTE — Progress Notes (Signed)
Subjective:    Patient ID: [name], female    DOB: [birth year], 77 y.o.   MRN: [id number]  Synopsis: Former patient of Dr. 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 She was seen at [facility] for a second opinion and they felt that she had hypersensitivity pneumonitis, chronic form.  She also has a history of rheumatoid arthritis.     HPI Chief Complaint  Patient presents with  . Follow-up    productive cough (clear/yellow), increased SOB, increased fatigue, sputum results   Ms. daughter he says that she has been struggling a bit recently.  Specifically she says that she has a lot of pain and stiffness in her shoulders and hips.  This is been present for the last several months.  She says that this is a new pain which is worse than the right sided shoulder pain she has dealt with for a number of years.  She says that she is having a cough and mucus production but it is at baseline for her.  She still has shortness of breath when she walks around.  She continues to use and benefit from oxygen on a daily basis.  Past Medical History:  Diagnosis Date  . Allergic rhinitis    takes Claritin nightly  . Anxiety    but not on any meds  . Arthritis   . Asthma   . Carpal tunnel syndrome   . GERD (gastroesophageal reflux disease)   . History of bronchitis 3-46yr ago  . History of colon polyps    benign  . Joint pain   . Peripheral edema    takes Lasix daily  . Shortness of breath dyspnea    with exertion;Albuterol inhaler as needed  . Thyroid nodule       Review of Systems  Constitutional: Negative for chills, fatigue and fever.  HENT: Negative for postnasal drip, rhinorrhea and sinus pressure.   Respiratory: Negative for cough, shortness of breath and wheezing.   Cardiovascular: Negative for chest pain, palpitations and leg swelling.       Objective:   Physical Exam Vitals:     02/12/18 1101  BP: 122/74  Pulse: 85  SpO2: 98%  Weight: 222 lb (100.7 kg)  Height: 5' 2.01" (1.575 m)   3L pulse flow  Gen: chronically ill appearing HENT: OP clear, TM's clear, neck supple PULM: Wheezing RUL, crackles bases B, normal percussion CV: RRR, no mgr, trace edema GI: BS+, soft, nontender Derm: no cyanosis or rash Psyche: normal mood and affect   CBC    Component Value Date/Time   WBC 10.2 04/19/2017 1617   RBC 4.52 04/19/2017 1617   HGB 13.3 04/19/2017 1617   HCT 41.0 04/19/2017 1617   PLT 377.0 04/19/2017 1617   MCV 90.7 04/19/2017 1617   MCH 29.3 06/18/2016 0507   MCHC 32.4 04/19/2017 1617   RDW 14.0 04/19/2017 1617   LYMPHSABS 3.5 04/19/2017 1617   MONOABS 0.9 04/19/2017 1617   EOSABS 0.7 04/19/2017 1617   BASOSABS 0.1 04/19/2017 1617   Imaging: 06/2016 CT angiogram> no PE, superimposed ground glass changes in addition to baseline pulmonary fibrosis  Pulmonary function test: PFT's 2009:  FEV1 1.97 (84%),ratio 70, 22% increase FEV1 with BD, TLC 4.44 (82%), DLCO 16.7 (62%) 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  PFT's January 2016:  FVC  2.13 (66%), no obstruction, TLC 4.00 (72%), DLCO 13.70 (48%) March 2017 pulmonary function testing ratio 88%, FVC 2.03 L (84% predicted), total lung capacity 3.0 L (54% predicted), DLCO 14.7 (51% predicted) April 2018 ratio 89%, FVC 1.91 L 61% predicted, total lung capacity 3.49 L 63% predicted, DLCO 12.69 44% predicted November 2018 pulmonary function testing ratio 90%, FVC 1.72 L 55% predicted, total lung capacity 3.37 L 61% predicted, DLCO 11.2 39% predicted  6MW 02/2015 6 Min Walk: 1218 feet. The patient's lowest oxygen saturation was 90 %, highest heart rate was 118 bpm , and highest blood pressure was 180/80 07/2016 6 MW> 1320 feet 3 L O2 88%  Chest imaging: CT chest 2008:  Mild bronchiectasis, ?NSIP: CXR 05/2014:   Progressive IS changes compared to 2012. HRCT 04/2017: traction bronchiectasis and bronchovascular and peripheral fibrotic changes, worse in the upper lobes, some air trapping, no significant progression compared to 09/2016  HRCT 2016:  Changes c/w fibrotic NSIP?  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   Discussion: Ms. Benscoter describes symptoms of polymyalgia rheumatica today.  However, the differential diagnosis is broad.  I will check an ESR and have her follow-up with rheumatology.  She still deals with bronchiectasis symptoms on a day-to-day basis and unfortunately is not using mucociliary clearance measures right now.  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 Trudie Reed  We will see you back in November after the CT scan   Current Outpatient Medications:  .  albuterol (PROVENTIL HFA;VENTOLIN HFA) 108 (90 BASE) MCG/ACT inhaler, Inhale 2 puffs into the lungs every 6 (six) hours as  needed for wheezing or shortness of breath. , Disp: , Rfl:  .  albuterol (PROVENTIL) (2.5 MG/3ML) 0.083% nebulizer solution, Take 3 mLs (2.5 mg total) by nebulization every 6 (six) hours as needed for wheezing or shortness of breath., Disp: 360 mL, Rfl: 5 .  aspirin 325 MG tablet, Take 650 mg by mouth every 6 (six) hours as needed for mild pain or moderate pain. , Disp: , Rfl:  .  budesonide (PULMICORT) 0.5 MG/2ML nebulizer solution, Take 2 mLs (0.5 mg total) by nebulization 2 (two) times daily., Disp: 120 mL, Rfl: 12 .  fluticasone (FLONASE) 50 MCG/ACT nasal spray, Place 1 spray into both nostrils daily., Disp: , Rfl:  .  furosemide (LASIX) 20 MG tablet, Take 20 mg by mouth daily., Disp: , Rfl:  .  ibuprofen (ADVIL,MOTRIN) 200 MG tablet, Take 400 mg by mouth every 6 (six) hours as needed for mild pain or moderate pain. , Disp: ,  Rfl:  .  loratadine (CLARITIN) 5 MG chewable tablet, Chew 5 mg by mouth daily as needed for allergies., Disp: , Rfl:  .  montelukast (SINGULAIR) 10 MG tablet, Take 1 tablet (10 mg total) by mouth at bedtime., Disp: 30 tablet, Rfl: 2 .  Respiratory Therapy Supplies (FLUTTER) DEVI, Use as directed., Disp: 1 each, Rfl: 0 .  sodium chloride HYPERTONIC 3 % nebulizer solution, Take by nebulization 2 (two) times daily. (Patient not taking: Reported on 02/12/2018), Disp: 750 mL, Rfl: 12

## 2018-02-12 NOTE — Patient Instructions (Signed)
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 Trudie Reed  We will see you back in November after the CT scan

## 2018-02-15 LAB — ALPHA-1 ANTITRYPSIN PHENOTYPE: A-1 Antitrypsin, Ser: 148 mg/dL (ref 83–199)

## 2018-02-19 LAB — RESPIRATORY CULTURE OR RESPIRATORY AND SPUTUM CULTURE
MICRO NUMBER:: 90886663
RESULT:: NORMAL
SPECIMEN QUALITY:: ADEQUATE

## 2018-02-19 LAB — FUNGUS CULTURE W SMEAR
MICRO NUMBER:: 90886661
SMEAR:: NONE SEEN
SPECIMEN QUALITY:: ADEQUATE

## 2018-02-19 LAB — MYCOBACTERIA,CULT W/FLUOROCHROME SMEAR
MICRO NUMBER:: 90886662
SMEAR:: NONE SEEN
SPECIMEN QUALITY:: ADEQUATE

## 2018-02-20 ENCOUNTER — Telehealth: Payer: Self-pay | Admitting: Pulmonary Disease

## 2018-02-20 DIAGNOSIS — J479 Bronchiectasis, uncomplicated: Secondary | ICD-10-CM

## 2018-02-20 NOTE — Telephone Encounter (Signed)
Called and spoke to pt.  Pt has concerns regarding hypertonic treatment. Pt states it takes her an hour to complete treatment. Per our records Rx was sent to [place]. Pt states Rx was transferred to [facility].  Pt is concerned that she was provided with the incorrect vial of medication, due to amount of time it takes to complete treatment.  I have spoken to [place] with [facility] [place] [facility], who stated that hypertonic is 74m per pack, and directions are take as directed. [place] tell me if vial size is 177m as she did not have this medication in stock.   BQ please advise. Thanks

## 2018-02-21 MED ORDER — SODIUM CHLORIDE 3 % IN NEBU: INHALATION_SOLUTION | Freq: Two times a day (BID) | RESPIRATORY_TRACT | 12 refills | Status: DC

## 2018-02-21 NOTE — Telephone Encounter (Signed)
She needs to get it from [place]

## 2018-02-21 NOTE — Telephone Encounter (Signed)
Called and spoke to patient. Patient verbalized understanding and will start to get it from [facility]. Nothing further needed at this time.

## 2018-02-21 NOTE — Telephone Encounter (Signed)
Spoke with [facility], they don't have any record of having received the medication at all. Per pharmacists they have the Hypertonic solution in 15m vials and it is available. Attempted to call patient, phone just kept ringing and ringing. Unable to leave message. Will attempt to call again later this afternoon. Script has been sent to [facility]

## 2018-03-21 ENCOUNTER — Telehealth: Payer: Self-pay | Admitting: Pulmonary Disease

## 2018-03-21 NOTE — Telephone Encounter (Signed)
Called and spoke with pt and stated to her to call the dentist to see if they wanted her to be on an abx due to the congestion.  Pt expressed understanding. Nothing further needed.

## 2018-04-09 ENCOUNTER — Ambulatory Visit (INDEPENDENT_AMBULATORY_CARE_PROVIDER_SITE_OTHER)
Admission: RE | Admit: 2018-04-09 | Discharge: 2018-04-09 | Disposition: A | Discharge status: Home / Self Care | Payer: Medicare Other | Source: Ambulatory Visit | Attending: Pulmonary Disease | Admitting: Pulmonary Disease

## 2018-04-09 DIAGNOSIS — J849 Interstitial pulmonary disease, unspecified: Secondary | ICD-10-CM | POA: Diagnosis not present

## 2018-04-20 ENCOUNTER — Ambulatory Visit (INDEPENDENT_AMBULATORY_CARE_PROVIDER_SITE_OTHER): Payer: Medicare Other | Admitting: Pulmonary Disease

## 2018-04-20 ENCOUNTER — Encounter: Payer: Self-pay | Admitting: Pulmonary Disease

## 2018-04-20 VITALS — BP 152/82 | HR 89 | Ht 67.0 in | Wt 223.6 lb

## 2018-04-20 DIAGNOSIS — J849 Interstitial pulmonary disease, unspecified: Secondary | ICD-10-CM

## 2018-04-20 DIAGNOSIS — J479 Bronchiectasis, uncomplicated: Secondary | ICD-10-CM | POA: Diagnosis not present

## 2018-04-20 DIAGNOSIS — J9611 Chronic respiratory failure with hypoxia: Secondary | ICD-10-CM

## 2018-04-20 MED ORDER — ALBUTEROL SULFATE HFA 108 (90 BASE) MCG/ACT IN AERS: 2.0000 | INHALATION_SPRAY | Freq: Four times a day (QID) | RESPIRATORY_TRACT | 5 refills | Status: DC | PRN

## 2018-04-20 NOTE — Patient Instructions (Signed)
Chronic hypersensitivity pneumonitis, fibrotic type The CT scan showed minimal progression On the next visit we will discuss whether or not we should use a medicine called nintedanib for this  Chronic respiratory failure with hypoxemia: Keep using oxygen continuously as you are doing From my standpoint its okay to have shoulder surgery, I would prefer you to have it at the [facility] lung surgery center though so that the pulmonary and critical care team can be immediately available to help if necessary  Bronchiectasis: Keep using hypertonic saline nebulized twice a day Use the Proventil either inhaled through an HFA inhaler or nebulized as needed for chest tightness wheezing or shortness of breath  We will see you back in 8 weeks or sooner if needed

## 2018-04-20 NOTE — Progress Notes (Signed)
Subjective:    Patient ID: [name], female    DOB: [birth year], 77 y.o.   MRN: [id number]  Synopsis: Former patient of Dr. 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 She was seen at [facility] for a second opinion and they felt that she had hypersensitivity pneumonitis, chronic form.  She also has a history of rheumatoid arthritis.     HPI Chief Complaint  Patient presents with  . Follow-up    States her breathing is at her baseline, no new concerns.    Ms. daughter he has been doing well since the last visit.  She said that she took some antibiotics for a tooth infection and her breathing actually improved during that time.  She still produces some yellow mucus.  She likes the hypertonic saline, it helps her produce mucus twice a day.  She does not use Proventil or budesonide.  She continues to use and benefit from her oxygen.  Past Medical History:  Diagnosis Date  . Allergic rhinitis    takes Claritin nightly  . Anxiety    but not on any meds  . Arthritis   . Asthma   . Carpal tunnel syndrome   . GERD (gastroesophageal reflux disease)   . History of bronchitis 3-58yr ago  . History of colon polyps    benign  . Joint pain   . Peripheral edema    takes Lasix daily  . Shortness of breath dyspnea    with exertion;Albuterol inhaler as needed  . Thyroid nodule       Review of Systems  Constitutional: Negative for chills, fatigue and fever.  HENT: Negative for postnasal drip, rhinorrhea and sinus pressure.   Respiratory: Negative for cough, shortness of breath and wheezing.   Cardiovascular: Negative for chest pain, palpitations and leg swelling.       Objective:   Physical Exam Vitals:   04/20/18 1207  BP: (!) 152/82  Pulse: 89  SpO2: 95%  Weight: 223 lb 9.6 oz (101.4 kg)  Height: _0  (1.702 m)   3L pulse flow  Gen: chronically ill appearing HENT: OP clear,  TM's clear, neck supple PULM: Crackles bases bilaterally, normal percussion CV: RRR, no mgr, trace edema GI: BS+, soft, nontender Derm: no cyanosis or rash Psyche: normal mood and affect    CBC    Component Value Date/Time   WBC 10.2 04/19/2017 1617   RBC 4.52 04/19/2017 1617   HGB 13.3 04/19/2017 1617   HCT 41.0 04/19/2017 1617   PLT 377.0 04/19/2017 1617   MCV 90.7 04/19/2017 1617   MCH 29.3 06/18/2016 0507   MCHC 32.4 04/19/2017 1617   RDW 14.0 04/19/2017 1617   LYMPHSABS 3.5 04/19/2017 1617   MONOABS 0.9 04/19/2017 1617   EOSABS 0.7 04/19/2017 1617   BASOSABS 0.1 04/19/2017 1617   Imaging: 06/2016 CT angiogram> no PE, superimposed ground glass changes in addition to baseline pulmonary fibrosis  Pulmonary function test: PFT's 2009:  FEV1 1.97 (84%),ratio 70, 22% increase FEV1 with BD, TLC 4.44 (82%), DLCO 16.7 (62%) 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  PFT's January 2016:  FVC 2.13 (66%), no obstruction, TLC 4.00 (72%), DLCO 13.70 (48%) March 2017 pulmonary function testing ratio 88%, FVC 2.03 L (84% predicted), total lung capacity 3.0 L (54% predicted), DLCO 14.7 (51% predicted) April 2018 ratio 89%,  FVC 1.91 L 61% predicted, total lung capacity 3.49 L 63% predicted, DLCO 12.69 44% predicted November 2018 pulmonary function testing ratio 90%, FVC 1.72 L 55% predicted, total lung capacity 3.37 L 61% predicted, DLCO 11.2 39% predicted  6MW 02/2015 6 Min Walk: 1218 feet. The patient's lowest oxygen saturation was 90 %, highest heart rate was 118 bpm , and highest blood pressure was 180/80 07/2016 6 MW> 1320 feet 3 L O2 88%  Chest imaging: CT chest 2008:  Mild bronchiectasis, ?NSIP: CXR 05/2014:  Progressive IS changes compared to 2012. HRCT 04/2017: traction bronchiectasis and bronchovascular and peripheral fibrotic changes, worse in the upper lobes, some air trapping, no  significant progression compared to 09/2016 November 2019 high-resolution CT scan of the chest images independently reviewed showing slight progression of her upper lobe predominant air trapping and fibrotic lung disease consistent with hypersensitivity pneumonitis  HRCT 2016:  Changes c/w fibrotic NSIP?  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  She did better with an antibiotic recently, this makes me consider whether or not we should put her on a daily antibiotic.  Also, there is new data suggesting that non-IPF fibrotic lung disease can be treated with 05 to slow the progression.  I have discussed this with her briefly today.  We may want to consider daily azithromycin if she has another flareup of her bronchiectasis.  I am okay with her stopping budesonide as it never really seem to make much of a difference.  Plan: Chronic hypersensitivity pneumonitis, fibrotic type The CT scan showed minimal progression On the next visit we will discuss whether or not we should use a medicine called nintedanib for this  Chronic respiratory failure with hypoxemia: Keep using oxygen continuously as you are doing From my standpoint its okay to have shoulder surgery, I would prefer you to have it at the [facility] lung surgery center though so that the pulmonary and critical care team can be immediately available to help if necessary  Bronchiectasis: Keep using hypertonic saline nebulized twice a  day Use the Proventil either inhaled through an HFA inhaler or nebulized as needed for chest tightness wheezing or shortness of breath  We will see you back in 8 weeks or sooner if needed   Current Outpatient Medications:  .  albuterol (PROVENTIL HFA;VENTOLIN HFA) 108 (90 Base) MCG/ACT inhaler, Inhale 2 puffs into the lungs every 6 (six) hours as needed for wheezing or shortness of breath., Disp: 1 Inhaler, Rfl: 5 .  albuterol (PROVENTIL) (2.5 MG/3ML) 0.083% nebulizer solution, Take 3 mLs (2.5 mg total) by nebulization every 6 (six) hours as needed for wheezing or shortness of breath., Disp: 360 mL, Rfl: 5 .  aspirin 325 MG tablet, Take 650 mg by mouth every 6 (six) hours as needed for mild pain or moderate pain. , Disp: , Rfl:  .  fluticasone (FLONASE) 50 MCG/ACT nasal spray, Place 1 spray into both nostrils daily., Disp: , Rfl:  .  furosemide (LASIX) 20 MG tablet, Take 20 mg by mouth daily., Disp: , Rfl:  .  ibuprofen (ADVIL,MOTRIN) 200 MG tablet, Take 400 mg by mouth every 6 (six) hours as needed for mild pain or moderate pain. , Disp: , Rfl:  .  loratadine (CLARITIN) 5 MG chewable tablet, Chew 5 mg by mouth daily as needed for allergies., Disp: , Rfl:  .  montelukast (SINGULAIR) 10 MG tablet, Take 1 tablet (10 mg total) by mouth at bedtime., Disp: 30 tablet, Rfl: 2 .  Respiratory Therapy Supplies (FLUTTER) DEVI, Use as directed., Disp: 1 each, Rfl: 0 .  sodium chloride HYPERTONIC 3 % nebulizer solution, Take by nebulization 2 (two) times daily., Disp: 750 mL, Rfl: 12

## 2018-05-02 DIAGNOSIS — M25511 Pain in right shoulder: Secondary | ICD-10-CM | POA: Diagnosis not present

## 2018-05-02 DIAGNOSIS — M19019 Primary osteoarthritis, unspecified shoulder: Secondary | ICD-10-CM | POA: Diagnosis not present

## 2018-05-28 NOTE — Progress Notes (Signed)
Clearance Dr. [place] Bells 05-07-18 on chart. cxr 2 view 07-14-17 epic Echo 06-2016 in epic

## 2018-05-28 NOTE — Patient Instructions (Addendum)
Natalyia JACKQUELYN SUNDBERG  05/28/2018   Your procedure is scheduled on: Thursday 06/07/2018   Report to [facility] Main  Entrance               Report to admitting at         1100 AM    Call this number if you have problems the morning of surgery [phone]    Remember: Do not eat food:After Midnight. You may have clear liquids until 0700 then nothing by mouth    CLEAR LIQUID DIET   Foods Allowed                                                                     Foods Excluded  Coffee and tea, regular and decaf                             liquids that you cannot  Plain Jell-O in any flavor                                             see through such as: Fruit ices (not with fruit pulp)                                     milk, soups, orange juice  Iced Popsicles                                    All solid food Carbonated beverages, regular and diet                                    Cranberry, grape and apple juices Sports drinks like Gatorade Lightly seasoned clear broth or consume(fat free) Sugar, honey syrup  Sample Menu Breakfast                                Lunch                                     Supper Cranberry juice                    Beef broth                            Chicken broth Jell-O                                     Grape juice  Apple juice Coffee or tea                        Jell-O                                      Popsicle                                                Coffee or tea                        Coffee or tea  _____________________________________________________________________   BRUSH YOUR TEETH MORNING OF SURGERY AND RINSE YOUR MOUTH OUT, NO CHEWING GUM CANDY OR MINTS.     Take these medicines the morning of surgery with A SIP OF WATER: use nebulizer if needed, flonase nasal spray, and bring  Inhaler with you                                You may not have any metal on your body  including hair pins and              piercings  Do not wear jewelry, make-up, lotions, powders or perfumes, deodorant             Do not wear nail polish.  Do not shave  48 hours prior to surgery.     Do not bring valuables to the hospital. [place].  Contacts, dentures or bridgework may not be worn into surgery.  Leave suitcase in the car. After surgery it may be brought to your room.                  Please read over the following fact sheets you were given: _____________________________________________________________________           [facility] - Preparing for Surgery Before surgery, you can play an important role.  Because skin is not sterile, your skin needs to be as free of germs as possible.  You can reduce the number of germs on your skin by washing with CHG (chlorahexidine gluconate) soap before surgery.  CHG is an antiseptic cleaner which kills germs and bonds with the skin to continue killing germs even after washing. Please DO NOT use if you have an allergy to CHG or antibacterial soaps.  If your skin becomes reddened/irritated stop using the CHG and inform your nurse when you arrive at Short Stay. Do not shave (including legs and underarms) for at least 48 hours prior to the first CHG shower.  You may shave your face/neck. Please follow these instructions carefully:  1.  Shower with CHG Soap the night before surgery and the  morning of Surgery.  2.  If you choose to wash your hair, wash your hair first as usual with your  normal  shampoo.  3.  After you shampoo, rinse your hair and body thoroughly to remove the  shampoo.  4.  Use CHG as you would any other liquid soap.  You can apply chg directly  to the skin and wash                       Gently with a scrungie or clean washcloth.  5.  Apply the CHG Soap to your body ONLY FROM THE NECK DOWN.   Do not use on face/ open                           Wound  or open sores. Avoid contact with eyes, ears mouth and genitals (private parts).                       Wash face,  Genitals (private parts) with your normal soap.             6.  Wash thoroughly, paying special attention to the area where your surgery  will be performed.  7.  Thoroughly rinse your body with warm water from the neck down.  8.  DO NOT shower/wash with your normal soap after using and rinsing off  the CHG Soap.                9.  Pat yourself dry with a clean towel.            10.  Wear clean pajamas.            11.  Place clean sheets on your bed the night of your first shower and do not  sleep with pets. Day of Surgery : Do not apply any lotions/deodorants the morning of surgery.  Please wear clean clothes to the hospital/surgery center.  FAILURE TO FOLLOW THESE INSTRUCTIONS MAY RESULT IN THE CANCELLATION OF YOUR SURGERY PATIENT SIGNATURE_________________________________  NURSE SIGNATURE__________________________________  ________________________________________________________________________   Adam Phenix  An incentive spirometer is a tool that can help keep your lungs clear and active. This tool measures how well you are filling your lungs with each breath. Taking long deep breaths may help reverse or decrease the chance of developing breathing (pulmonary) problems (especially infection) following:  A long period of time when you are unable to move or be active. BEFORE THE PROCEDURE   If the spirometer includes an indicator to show your best effort, your nurse or respiratory therapist will set it to a desired goal.  If possible, sit up straight or lean slightly forward. Try not to slouch.  Hold the incentive spirometer in an upright position. INSTRUCTIONS FOR USE  1. Sit on the edge of your bed if possible, or sit up as far as you can in bed or on a chair. 2. Hold the incentive spirometer in an upright position. 3. Breathe out normally. 4. Place the  mouthpiece in your mouth and seal your lips tightly around it. 5. Breathe in slowly and as deeply as possible, raising the piston or the ball toward the top of the column. 6. Hold your breath for 3-5 seconds or for as long as possible. Allow the piston or ball to fall to the bottom of the column. 7. Remove the mouthpiece from your mouth and breathe out normally. 8. Rest for a few seconds and repeat Steps 1 through 7 at least 10 times every 1-2 hours when you are awake. Take your time and take a few normal breaths between deep breaths. 9. The spirometer may include an indicator to  show your best effort. Use the indicator as a goal to work toward during each repetition. 10. After each set of 10 deep breaths, practice coughing to be sure your lungs are clear. If you have an incision (the cut made at the time of surgery), support your incision when coughing by placing a pillow or rolled up towels firmly against it. Once you are able to get out of bed, walk around indoors and cough well. You may stop using the incentive spirometer when instructed by your caregiver.  RISKS AND COMPLICATIONS  Take your time so you do not get dizzy or light-headed.  If you are in pain, you may need to take or ask for pain medication before doing incentive spirometry. It is harder to take a deep breath if you are having pain. AFTER USE  Rest and breathe slowly and easily.  It can be helpful to keep track of a log of your progress. Your caregiver can provide you with a simple table to help with this. If you are using the spirometer at home, follow these instructions: [place] IF:   You are having difficultly using the spirometer.  You have trouble using the spirometer as often as instructed.  Your pain medication is not giving enough relief while using the spirometer.  You develop fever of 100.5 F (38.1 C) or higher. SEEK IMMEDIATE MEDICAL CARE IF:   You cough up bloody sputum that had not been present  before.  You develop fever of 102 F (38.9 C) or greater.  You develop worsening pain at or near the incision site. MAKE SURE YOU:   Understand these instructions.  Will watch your condition.  Will get help right away if you are not doing well or get worse. Document Released: 10/03/2006 Document Revised: 08/15/2011 Document Reviewed: 12/04/2006 [facility] Patient Information 2014 [place], [place].   ________________________________________________________________________

## 2018-05-31 ENCOUNTER — Other Ambulatory Visit: Payer: Self-pay

## 2018-05-31 ENCOUNTER — Encounter (HOSPITAL_COMMUNITY)
Admission: RE | Admit: 2018-05-31 | Discharge: 2018-05-31 | Disposition: A | Discharge status: Home / Self Care | Payer: Medicare Other | Source: Ambulatory Visit | Attending: Orthopedic Surgery | Admitting: Orthopedic Surgery

## 2018-05-31 ENCOUNTER — Encounter (HOSPITAL_COMMUNITY): Payer: Self-pay

## 2018-05-31 DIAGNOSIS — I10 Essential (primary) hypertension: Secondary | ICD-10-CM | POA: Diagnosis not present

## 2018-05-31 DIAGNOSIS — Z01818 Encounter for other preprocedural examination: Secondary | ICD-10-CM | POA: Diagnosis not present

## 2018-05-31 HISTORY — DX: Pulmonary hypertension, unspecified: I27.20

## 2018-05-31 HISTORY — DX: Heart failure, unspecified: I50.9

## 2018-05-31 HISTORY — DX: Hypersensitivity pneumonitis due to unspecified organic dust: J67.9

## 2018-05-31 LAB — BASIC METABOLIC PANEL
Anion gap: 9 (ref 5–15)
BUN: 12 mg/dL (ref 8–23)
CO2: 30 mmol/L (ref 22–32)
Calcium: 9.1 mg/dL (ref 8.9–10.3)
Chloride: 102 mmol/L (ref 98–111)
Creatinine, Ser: 0.52 mg/dL (ref 0.44–1.00)
GFR calc Af Amer: >60 mL/min (ref >60)
GFR calc non Af Amer: >60 mL/min (ref >60)
Glucose, Bld: 89 mg/dL (ref 70–99)
Potassium: 3.8 mmol/L (ref 3.5–5.1)
Sodium: 141 mmol/L (ref 135–145)

## 2018-05-31 LAB — SURGICAL PCR SCREEN
MRSA, PCR: NEGATIVE
Staphylococcus aureus: NEGATIVE

## 2018-05-31 LAB — CBC
HCT: 44.5 % (ref 36.0–46.0)
Hemoglobin: 13.5 g/dL (ref 12.0–15.0)
MCH: 28.4 pg (ref 26.0–34.0)
MCHC: 30.3 g/dL (ref 30.0–36.0)
MCV: 93.7 fL (ref 80.0–100.0)
Platelets: 348 10*3/uL (ref 150–400)
RBC: 4.75 MIL/uL (ref 3.87–5.11)
RDW: 13.8 % (ref 11.5–15.5)
WBC: 11.4 10*3/uL — ABNORMAL HIGH (ref 4.0–10.5)
nRBC: 0 % (ref 0.0–0.2)

## 2018-06-04 ENCOUNTER — Telehealth: Payer: Self-pay | Admitting: Pulmonary Disease

## 2018-06-04 NOTE — Telephone Encounter (Signed)
Called and spoke with patients son, he stated that the patient is to be having her surgery on Thursday 06/07/18 and per BQ he wanted to make sure that he was aware of this.   BQ please advise, thank you.

## 2018-06-06 NOTE — Anesthesia Preprocedure Evaluation (Addendum)
Anesthesia Evaluation  Patient identified by MRN, date of birth, ID band Patient awake    Reviewed: Allergy & Precautions, NPO status , Patient's Chart, lab work & pertinent test results  Airway Mallampati: II  TM Distance: >3 FB Neck ROM: Full    Dental no notable dental hx. (+) Teeth Intact, Dental Advisory Given   Pulmonary shortness of breath and Long-Term Oxygen Therapy,  Hx of Chronic interstitial Pneumonitis   Pulmonary exam normal breath sounds clear to auscultation       Cardiovascular hypertension, Normal cardiovascular exam Rhythm:Regular Rate:Normal     Neuro/Psych Anxiety  Neuromuscular disease    GI/Hepatic negative GI ROS, Neg liver ROS,   Endo/Other  negative endocrine ROS  Renal/GU negative Renal ROS     Musculoskeletal  (+) Arthritis , Rheumatoid disorders,    Abdominal (+) + obese,   Peds  Hematology   Anesthesia Other Findings   Reproductive/Obstetrics                           Anesthesia Physical Anesthesia Plan  ASA: III  Anesthesia Plan: General   Post-op Pain Management:  Regional for Post-op pain   Induction: Intravenous  PONV Risk Score and Plan: 3 and Treatment may vary due to age or medical condition, Dexamethasone and Ondansetron  Airway Management Planned: Oral ETT  Additional Equipment:   Intra-op Plan:   Post-operative Plan: Extubation in OR and Possible Post-op intubation/ventilation  Informed Consent: I have reviewed the patients History and Physical, chart, labs and discussed the procedure including the risks, benefits and alternatives for the proposed anesthesia with the patient or authorized representative who has indicated his/her understanding and acceptance.   Dental advisory given  Plan Discussed with: CRNA  Anesthesia Plan Comments: (Had Long discussion with patient and her son at Bedside about interscalene block and its side effects :   the 60% chance of getting hemidiaphragmatic paralysis and the potential for 48-72 hours of pain relief vs treating her pain with Narcotics and requiring more medication and the possibility this affecting her breathing. And the possibility of post op ventillation The patient decided in coordination with her son to do the R interscalene block w exparel)      Anesthesia Quick Evaluation

## 2018-06-06 NOTE — Telephone Encounter (Signed)
OK, thanks for notice

## 2018-06-07 ENCOUNTER — Other Ambulatory Visit: Payer: Self-pay

## 2018-06-07 ENCOUNTER — Encounter (HOSPITAL_COMMUNITY)
Admission: RE | Disposition: A | Discharge status: Home / Self Care | Payer: Self-pay | Source: Home / Self Care | Attending: Orthopedic Surgery

## 2018-06-07 ENCOUNTER — Inpatient Hospital Stay (HOSPITAL_COMMUNITY): Payer: Medicare Other | Admitting: Anesthesiology

## 2018-06-07 ENCOUNTER — Inpatient Hospital Stay (HOSPITAL_COMMUNITY)
Admission: RE | Admit: 2018-06-07 | Discharge: 2018-06-08 | DRG: 483 | Disposition: A | Discharge status: Home / Self Care | Payer: Medicare Other | Attending: Orthopedic Surgery | Admitting: Orthopedic Surgery

## 2018-06-07 ENCOUNTER — Encounter (HOSPITAL_COMMUNITY): Payer: Self-pay | Admitting: Anesthesiology

## 2018-06-07 ENCOUNTER — Inpatient Hospital Stay (HOSPITAL_COMMUNITY): Payer: Medicare Other

## 2018-06-07 DIAGNOSIS — Z96611 Presence of right artificial shoulder joint: Secondary | ICD-10-CM | POA: Diagnosis not present

## 2018-06-07 DIAGNOSIS — Z91048 Other nonmedicinal substance allergy status: Secondary | ICD-10-CM

## 2018-06-07 DIAGNOSIS — M25711 Osteophyte, right shoulder: Secondary | ICD-10-CM | POA: Diagnosis not present

## 2018-06-07 DIAGNOSIS — Z882 Allergy status to sulfonamides status: Secondary | ICD-10-CM | POA: Diagnosis not present

## 2018-06-07 DIAGNOSIS — Z881 Allergy status to other antibiotic agents status: Secondary | ICD-10-CM

## 2018-06-07 DIAGNOSIS — J679 Hypersensitivity pneumonitis due to unspecified organic dust: Secondary | ICD-10-CM

## 2018-06-07 DIAGNOSIS — Z9911 Dependence on respirator [ventilator] status: Secondary | ICD-10-CM

## 2018-06-07 DIAGNOSIS — Z91040 Latex allergy status: Secondary | ICD-10-CM | POA: Diagnosis not present

## 2018-06-07 DIAGNOSIS — K219 Gastro-esophageal reflux disease without esophagitis: Secondary | ICD-10-CM | POA: Diagnosis not present

## 2018-06-07 DIAGNOSIS — Z7982 Long term (current) use of aspirin: Secondary | ICD-10-CM | POA: Diagnosis not present

## 2018-06-07 DIAGNOSIS — J8489 Other specified interstitial pulmonary diseases: Secondary | ICD-10-CM | POA: Diagnosis present

## 2018-06-07 DIAGNOSIS — Z9981 Dependence on supplemental oxygen: Secondary | ICD-10-CM

## 2018-06-07 DIAGNOSIS — Z831 Family history of other infectious and parasitic diseases: Secondary | ICD-10-CM

## 2018-06-07 DIAGNOSIS — Z9841 Cataract extraction status, right eye: Secondary | ICD-10-CM

## 2018-06-07 DIAGNOSIS — Z7951 Long term (current) use of inhaled steroids: Secondary | ICD-10-CM | POA: Diagnosis not present

## 2018-06-07 DIAGNOSIS — J9621 Acute and chronic respiratory failure with hypoxia: Secondary | ICD-10-CM | POA: Diagnosis not present

## 2018-06-07 DIAGNOSIS — G8918 Other acute postprocedural pain: Secondary | ICD-10-CM | POA: Diagnosis not present

## 2018-06-07 DIAGNOSIS — Z9842 Cataract extraction status, left eye: Secondary | ICD-10-CM | POA: Diagnosis not present

## 2018-06-07 DIAGNOSIS — Z96619 Presence of unspecified artificial shoulder joint: Secondary | ICD-10-CM

## 2018-06-07 DIAGNOSIS — Z79899 Other long term (current) drug therapy: Secondary | ICD-10-CM

## 2018-06-07 DIAGNOSIS — R918 Other nonspecific abnormal finding of lung field: Secondary | ICD-10-CM | POA: Diagnosis not present

## 2018-06-07 DIAGNOSIS — J479 Bronchiectasis, uncomplicated: Secondary | ICD-10-CM

## 2018-06-07 DIAGNOSIS — Z961 Presence of intraocular lens: Secondary | ICD-10-CM | POA: Diagnosis present

## 2018-06-07 DIAGNOSIS — I110 Hypertensive heart disease with heart failure: Secondary | ICD-10-CM | POA: Diagnosis not present

## 2018-06-07 DIAGNOSIS — J45909 Unspecified asthma, uncomplicated: Secondary | ICD-10-CM | POA: Diagnosis not present

## 2018-06-07 DIAGNOSIS — M19011 Primary osteoarthritis, right shoulder: Principal | ICD-10-CM | POA: Diagnosis present

## 2018-06-07 DIAGNOSIS — Z86010 Personal history of colonic polyps: Secondary | ICD-10-CM

## 2018-06-07 DIAGNOSIS — J9611 Chronic respiratory failure with hypoxia: Secondary | ICD-10-CM | POA: Diagnosis not present

## 2018-06-07 DIAGNOSIS — I5030 Unspecified diastolic (congestive) heart failure: Secondary | ICD-10-CM | POA: Diagnosis not present

## 2018-06-07 DIAGNOSIS — Z96652 Presence of left artificial knee joint: Secondary | ICD-10-CM | POA: Diagnosis present

## 2018-06-07 HISTORY — PX: REVERSE SHOULDER ARTHROPLASTY: SHX5054

## 2018-06-07 LAB — BLOOD GAS, ARTERIAL
Acid-Base Excess: 2.4 mmol/L — ABNORMAL HIGH (ref 0.0–2.0)
Bicarbonate: 26.9 mmol/L (ref 20.0–28.0)
Drawn by: 270211
FIO2: 1
MECHVT: 470 mL
O2 Saturation: 99.8 %
PEEP: 5 cmH2O
Patient temperature: 98.6
RATE: 15 resp/min
pCO2 arterial: 43.1 mmHg (ref 32.0–48.0)
pH, Arterial: 7.412 (ref 7.350–7.450)
pO2, Arterial: 288 mmHg — ABNORMAL HIGH (ref 83.0–108.0)

## 2018-06-07 SURGERY — ARTHROPLASTY, SHOULDER, TOTAL, REVERSE
Anesthesia: General | Site: Shoulder | Laterality: Right

## 2018-06-07 MED ORDER — LACTATED RINGERS IV SOLN
INTRAVENOUS | Status: DC
  Administered 2018-06-07: 13:00:00 via INTRAVENOUS

## 2018-06-07 MED ORDER — ALUM & MAG HYDROXIDE-SIMETH 200-200-20 MG/5ML PO SUSP: 30.0000 mL | ORAL | Status: DC | PRN

## 2018-06-07 MED ORDER — FENTANYL CITRATE (PF) 100 MCG/2ML IJ SOLN: 50.0000 ug | INTRAMUSCULAR | Status: DC | PRN

## 2018-06-07 MED ORDER — DIAZEPAM 5 MG PO TABS
5.0000 mg | ORAL_TABLET | Freq: Four times a day (QID) | ORAL | Status: DC | PRN
  Administered 2018-06-07: 5 mg via ORAL
  Filled 2018-06-07: qty 1

## 2018-06-07 MED ORDER — ASPIRIN 325 MG PO TABS: 650.0000 mg | ORAL_TABLET | Freq: Four times a day (QID) | ORAL | Status: DC | PRN

## 2018-06-07 MED ORDER — ALBUTEROL SULFATE (2.5 MG/3ML) 0.083% IN NEBU: 2.5000 mg | INHALATION_SOLUTION | RESPIRATORY_TRACT | Status: DC | PRN

## 2018-06-07 MED ORDER — HYDROMORPHONE HCL 1 MG/ML IJ SOLN: 0.5000 mg | INTRAMUSCULAR | Status: DC | PRN

## 2018-06-07 MED ORDER — PROPOFOL 10 MG/ML IV BOLUS
INTRAVENOUS | Status: AC
  Filled 2018-06-07: qty 20

## 2018-06-07 MED ORDER — PHENOL 1.4 % MT LIQD: 1.0000 | OROMUCOSAL | Status: DC | PRN

## 2018-06-07 MED ORDER — OXYCODONE HCL 5 MG PO TABS: 10.0000 mg | ORAL_TABLET | ORAL | Status: DC | PRN

## 2018-06-07 MED ORDER — METHOCARBAMOL 500 MG IVPB - SIMPLE MED
500.0000 mg | Freq: Four times a day (QID) | INTRAVENOUS | Status: DC | PRN
  Filled 2018-06-07: qty 50

## 2018-06-07 MED ORDER — PANTOPRAZOLE SODIUM 40 MG PO PACK: 40.0000 mg | PACK | Freq: Every day | ORAL | Status: DC

## 2018-06-07 MED ORDER — FENTANYL CITRATE (PF) 100 MCG/2ML IJ SOLN
50.0000 ug | INTRAMUSCULAR | Status: DC | PRN
  Filled 2018-06-07: qty 2

## 2018-06-07 MED ORDER — DIPHENHYDRAMINE HCL 12.5 MG/5ML PO ELIX: 12.5000 mg | ORAL_SOLUTION | ORAL | Status: DC | PRN

## 2018-06-07 MED ORDER — ALBUTEROL SULFATE HFA 108 (90 BASE) MCG/ACT IN AERS: 2.0000 | INHALATION_SPRAY | Freq: Four times a day (QID) | RESPIRATORY_TRACT | Status: DC | PRN

## 2018-06-07 MED ORDER — STERILE WATER FOR IRRIGATION IR SOLN
Status: DC | PRN
  Administered 2018-06-07: 1000 mL

## 2018-06-07 MED ORDER — DEXMEDETOMIDINE HCL IN NACL 200 MCG/50ML IV SOLN
0.4000 ug/kg/h | INTRAVENOUS | Status: DC
  Filled 2018-06-07: qty 50

## 2018-06-07 MED ORDER — ONDANSETRON HCL 4 MG/2ML IJ SOLN: 4.0000 mg | Freq: Four times a day (QID) | INTRAMUSCULAR | Status: DC | PRN

## 2018-06-07 MED ORDER — TRAMADOL HCL 50 MG PO TABS: 50.0000 mg | ORAL_TABLET | Freq: Four times a day (QID) | ORAL | Status: DC | PRN

## 2018-06-07 MED ORDER — CHLORHEXIDINE GLUCONATE 4 % EX LIQD: 60.0000 mL | Freq: Once | CUTANEOUS | Status: DC

## 2018-06-07 MED ORDER — MAGNESIUM CITRATE PO SOLN: 1.0000 | Freq: Once | ORAL | Status: DC | PRN

## 2018-06-07 MED ORDER — SODIUM CHLORIDE 0.9 % IR SOLN
Status: DC | PRN
  Administered 2018-06-07: 1000 mL

## 2018-06-07 MED ORDER — BUDESONIDE 0.5 MG/2ML IN SUSP
0.5000 mg | Freq: Two times a day (BID) | RESPIRATORY_TRACT | Status: DC
  Administered 2018-06-07 – 2018-06-08 (×2): 0.5 mg via RESPIRATORY_TRACT
  Filled 2018-06-07 (×2): qty 2

## 2018-06-07 MED ORDER — PROPOFOL 10 MG/ML IV BOLUS
INTRAVENOUS | Status: DC | PRN
  Administered 2018-06-07: 150 mg via INTRAVENOUS

## 2018-06-07 MED ORDER — ACETAMINOPHEN 325 MG PO TABS: 325.0000 mg | ORAL_TABLET | Freq: Four times a day (QID) | ORAL | Status: DC | PRN

## 2018-06-07 MED ORDER — FENTANYL CITRATE (PF) 100 MCG/2ML IJ SOLN
INTRAMUSCULAR | Status: DC | PRN
  Administered 2018-06-07: 100 ug via INTRAVENOUS

## 2018-06-07 MED ORDER — PROPOFOL 500 MG/50ML IV EMUL
INTRAVENOUS | Status: DC | PRN
  Administered 2018-06-07: 75 ug/kg/min via INTRAVENOUS

## 2018-06-07 MED ORDER — BUPIVACAINE HCL (PF) 0.5 % IJ SOLN
INTRAMUSCULAR | Status: DC | PRN
  Administered 2018-06-07: 15 mL via PERINEURAL

## 2018-06-07 MED ORDER — LIDOCAINE 2% (20 MG/ML) 5 ML SYRINGE
INTRAMUSCULAR | Status: DC | PRN
  Administered 2018-06-07: 100 mg via INTRAVENOUS

## 2018-06-07 MED ORDER — ORAL CARE MOUTH RINSE
15.0000 mL | Freq: Two times a day (BID) | OROMUCOSAL | Status: DC
  Administered 2018-06-07: 15 mL via OROMUCOSAL

## 2018-06-07 MED ORDER — OXYCODONE HCL 5 MG PO TABS: 5.0000 mg | ORAL_TABLET | ORAL | Status: DC | PRN

## 2018-06-07 MED ORDER — FENTANYL CITRATE (PF) 100 MCG/2ML IJ SOLN
50.0000 ug | INTRAMUSCULAR | Status: DC
  Administered 2018-06-07: 50 ug via INTRAVENOUS
  Filled 2018-06-07: qty 2

## 2018-06-07 MED ORDER — POLYETHYLENE GLYCOL 3350 17 G PO PACK: 17.0000 g | PACK | Freq: Every day | ORAL | Status: DC | PRN

## 2018-06-07 MED ORDER — DOCUSATE SODIUM 100 MG PO CAPS
100.0000 mg | ORAL_CAPSULE | Freq: Two times a day (BID) | ORAL | Status: DC
  Administered 2018-06-07: 100 mg via ORAL
  Filled 2018-06-07: qty 1

## 2018-06-07 MED ORDER — FUROSEMIDE 20 MG PO TABS: 20.0000 mg | ORAL_TABLET | Freq: Every day | ORAL | Status: DC | PRN

## 2018-06-07 MED ORDER — SUGAMMADEX SODIUM 200 MG/2ML IV SOLN
INTRAVENOUS | Status: AC
  Filled 2018-06-07: qty 2

## 2018-06-07 MED ORDER — DEXAMETHASONE SODIUM PHOSPHATE 10 MG/ML IJ SOLN
INTRAMUSCULAR | Status: DC | PRN
  Administered 2018-06-07: 10 mg via INTRAVENOUS

## 2018-06-07 MED ORDER — LACTATED RINGERS IV SOLN
INTRAVENOUS | Status: DC
  Administered 2018-06-07: 22:00:00 via INTRAVENOUS

## 2018-06-07 MED ORDER — ONDANSETRON HCL 4 MG/2ML IJ SOLN: 4.0000 mg | Freq: Once | INTRAMUSCULAR | Status: DC | PRN

## 2018-06-07 MED ORDER — BISACODYL 5 MG PO TBEC: 5.0000 mg | DELAYED_RELEASE_TABLET | Freq: Every day | ORAL | Status: DC | PRN

## 2018-06-07 MED ORDER — TRANEXAMIC ACID-NACL 1000-0.7 MG/100ML-% IV SOLN
1000.0000 mg | INTRAVENOUS | Status: AC
  Administered 2018-06-07: 1000 mg via INTRAVENOUS
  Filled 2018-06-07: qty 100

## 2018-06-07 MED ORDER — METHOCARBAMOL 500 MG PO TABS: 500.0000 mg | ORAL_TABLET | Freq: Four times a day (QID) | ORAL | Status: DC | PRN

## 2018-06-07 MED ORDER — METOCLOPRAMIDE HCL 5 MG/ML IJ SOLN: 5.0000 mg | Freq: Three times a day (TID) | INTRAMUSCULAR | Status: DC | PRN

## 2018-06-07 MED ORDER — ONDANSETRON HCL 4 MG PO TABS: 4.0000 mg | ORAL_TABLET | Freq: Four times a day (QID) | ORAL | Status: DC | PRN

## 2018-06-07 MED ORDER — MENTHOL 3 MG MT LOZG: 1.0000 | LOZENGE | OROMUCOSAL | Status: DC | PRN

## 2018-06-07 MED ORDER — FENTANYL CITRATE (PF) 100 MCG/2ML IJ SOLN
INTRAMUSCULAR | Status: AC
  Filled 2018-06-07: qty 2

## 2018-06-07 MED ORDER — METOCLOPRAMIDE HCL 5 MG PO TABS: 5.0000 mg | ORAL_TABLET | Freq: Three times a day (TID) | ORAL | Status: DC | PRN

## 2018-06-07 MED ORDER — ACETAMINOPHEN 10 MG/ML IV SOLN: 1000.0000 mg | Freq: Once | INTRAVENOUS | Status: DC | PRN

## 2018-06-07 MED ORDER — FENTANYL CITRATE (PF) 100 MCG/2ML IJ SOLN: 25.0000 ug | INTRAMUSCULAR | Status: DC | PRN

## 2018-06-07 MED ORDER — BUPIVACAINE LIPOSOME 1.3 % IJ SUSP
INTRAMUSCULAR | Status: DC | PRN
  Administered 2018-06-07: 10 mL via PERINEURAL

## 2018-06-07 MED ORDER — CEFAZOLIN SODIUM-DEXTROSE 2-4 GM/100ML-% IV SOLN
2.0000 g | INTRAVENOUS | Status: AC
  Administered 2018-06-07: 2 g via INTRAVENOUS
  Filled 2018-06-07: qty 100

## 2018-06-07 MED ORDER — FLUTICASONE PROPIONATE 50 MCG/ACT NA SUSP: 1.0000 | Freq: Every day | NASAL | Status: DC | PRN

## 2018-06-07 MED ORDER — SUGAMMADEX SODIUM 200 MG/2ML IV SOLN
INTRAVENOUS | Status: DC | PRN
  Administered 2018-06-07: 200 mg via INTRAVENOUS

## 2018-06-07 MED ORDER — ONDANSETRON HCL 4 MG/2ML IJ SOLN
INTRAMUSCULAR | Status: DC | PRN
  Administered 2018-06-07: 4 mg via INTRAVENOUS

## 2018-06-07 MED ORDER — ROCURONIUM BROMIDE 10 MG/ML (PF) SYRINGE
PREFILLED_SYRINGE | INTRAVENOUS | Status: DC | PRN
  Administered 2018-06-07: 60 mg via INTRAVENOUS

## 2018-06-07 SURGICAL SUPPLY — 66 items
ADH SKN CLS APL DERMABOND .7 (GAUZE/BANDAGES/DRESSINGS) ×1
BAG SPEC THK2 15X12 ZIP CLS (MISCELLANEOUS) ×1
BAG ZIPLOCK 12X15 (MISCELLANEOUS) ×2 IMPLANT
BLADE SAW SGTL 83.5X18.5 (BLADE) ×2 IMPLANT
BSPLAT GLND +2X24 MDLR (Joint) ×1 IMPLANT
COVER SURGICAL LIGHT HANDLE (MISCELLANEOUS) ×2 IMPLANT
COVER WAND RF STERILE (DRAPES) IMPLANT
CUP SUT UNIV REVERS 36 NEUTRAL (Cup) ×1 IMPLANT
DECANTER SPIKE VIAL GLASS SM (MISCELLANEOUS) ×2 IMPLANT
DERMABOND ADVANCED (GAUZE/BANDAGES/DRESSINGS) ×1
DERMABOND ADVANCED .7 DNX12 (GAUZE/BANDAGES/DRESSINGS) ×1 IMPLANT
DRAPE ORTHO SPLIT 77X108 STRL (DRAPES) ×2
DRAPE POUCH INSTRU U-SHP 10X18 (DRAPES) ×2 IMPLANT
DRAPE SURG 17X11 SM STRL (DRAPES) ×2 IMPLANT
DRAPE SURG ORHT 6 SPLT 77X108 (DRAPES) ×1 IMPLANT
DRAPE U-SHAPE 47X51 STRL (DRAPES) ×2 IMPLANT
DRSG AQUACEL AG ADV 3.5X10 (GAUZE/BANDAGES/DRESSINGS) ×2 IMPLANT
DURAPREP 26ML APPLICATOR (WOUND CARE) ×2 IMPLANT
ELECT BLADE TIP CTD 4 INCH (ELECTRODE) ×2 IMPLANT
ELECT REM PT RETURN 15FT ADLT (MISCELLANEOUS) ×2 IMPLANT
FACESHIELD WRAPAROUND (MASK) ×6 IMPLANT
FACESHIELD WRAPAROUND OR TEAM (MASK) ×3 IMPLANT
GLENOID UNI REV MOD 24 +2 LAT (Joint) ×1 IMPLANT
GLENOSPHERE 36 +4 LAT/24 (Joint) ×1 IMPLANT
GLOVE BIO SURGEON STRL SZ7.5 (GLOVE) ×2 IMPLANT
GLOVE BIO SURGEON STRL SZ8 (GLOVE) ×2 IMPLANT
GLOVE SS BIOGEL STRL SZ 7 (GLOVE) ×1 IMPLANT
GLOVE SS BIOGEL STRL SZ 7.5 (GLOVE) ×1 IMPLANT
GLOVE SUPERSENSE BIOGEL SZ 7 (GLOVE) ×1
GLOVE SUPERSENSE BIOGEL SZ 7.5 (GLOVE) ×1
GOWN STRL REUS W/ TWL XL LVL3 (GOWN DISPOSABLE) ×2 IMPLANT
GOWN STRL REUS W/TWL XL LVL3 (GOWN DISPOSABLE) ×4
KIT BASIN OR (CUSTOM PROCEDURE TRAY) ×2 IMPLANT
MANIFOLD NEPTUNE II (INSTRUMENTS) ×2 IMPLANT
NDL TAPERED W/ NITINOL LOOP (MISCELLANEOUS) ×1 IMPLANT
NEEDLE TAPERED W/ NITINOL LOOP (MISCELLANEOUS) ×2 IMPLANT
NS IRRIG 1000ML POUR BTL (IV SOLUTION) ×2 IMPLANT
PACK SHOULDER (CUSTOM PROCEDURE TRAY) ×2 IMPLANT
PASSER SUT SWANSON 36MM LOOP (INSTRUMENTS) ×2 IMPLANT
PIN SET IMPLANT
PIN SET MODULAR GLENOID SYSTEM (PIN) ×1 IMPLANT
PROTECTOR NERVE ULNAR (MISCELLANEOUS) ×2 IMPLANT
SCREW CENTRAL MOD 30MM (Screw) ×1 IMPLANT
SCREW PERI LOCK 5.5X16 (Screw) ×1 IMPLANT
SCREW PERI LOCK 5.5X24 (Screw) ×1 IMPLANT
SCREW PERI LOCK 5.5X32 (Screw) ×1 IMPLANT
SCREW PERIPHERAL 5.5X20 LOCK (Screw) ×1 IMPLANT
SLING ARM FOAM STRAP LRG (SOFTGOODS) ×2 IMPLANT
SLING ARM FOAM STRAP MED (SOFTGOODS) ×2 IMPLANT
SPONGE LAP 4X18 RFD (DISPOSABLE) ×2 IMPLANT
STAPLER VISISTAT 35W (STAPLE) ×2 IMPLANT
STEM HUMERAL UNI REVERS SZ6 (Stem) ×2 IMPLANT
SUCTION FRAZIER HANDLE 12FR (TUBING) ×1
SUCTION TUBE FRAZIER 12FR DISP (TUBING) ×1 IMPLANT
SUT ETHIBOND 2 OS 4 DA (SUTURE) ×8 IMPLANT
SUT ETHIBOND 5 LR DA (SUTURE) ×4 IMPLANT
SUT MNCRL AB 3-0 PS2 18 (SUTURE) ×2 IMPLANT
SUT VIC AB 1 CT1 36 (SUTURE) ×2 IMPLANT
SUT VIC AB 2-0 CT1 27 (SUTURE) ×2
SUT VIC AB 2-0 CT1 TAPERPNT 27 (SUTURE) ×1 IMPLANT
SUTURE TAPE 1.3 40 TPR END (SUTURE) ×1 IMPLANT
SUTURETAPE 1.3 40 TPR END (SUTURE) ×2
SYR 10ML LL (SYRINGE) ×2 IMPLANT
TOWEL OR 17X26 10 PK STRL BLUE (TOWEL DISPOSABLE) ×2 IMPLANT
TOWEL OR NON WOVEN STRL DISP B (DISPOSABLE) ×2 IMPLANT
WATER STERILE IRR 1000ML POUR (IV SOLUTION) ×4 IMPLANT

## 2018-06-07 NOTE — Op Note (Signed)
06/07/2018  2:26 PM  PATIENT:   [name]  78 y.o. female  PRE-OPERATIVE DIAGNOSIS:  right shoulder osteoarthritis  POST-OPERATIVE DIAGNOSIS: Same  PROCEDURE: Right shoulder reverse arthroplasty utilizing a press-fit size 6 Arthrex stem, +3 polyethylene insert, 36/+4 glenosphere on a small baseplate  SURGEON:  Apryll Hinkle, Metta Clines M.D.  ASSISTANTS: Jenetta Loges, PA-C  ANESTHESIA:   General endotracheal with Exparel interscalene block  EBL: 200 cc  SPECIMEN: None  Drains: None   PATIENT DISPOSITION:  ICU - intubated and hemodynamically stable.    PLAN OF CARE: Admit for overnight observation  Brief history:  [name] has been followed for chronic right shoulder pain related to end-stage osteoarthritis.  She does have known significant pulmonary dysfunction and has been evaluated extensively by her primary primary care as well as pulmonologist and it is felt as though her lung status would allow proceeding with elective shoulder replacement.  Due to her increasing pain and functional mentation she is brought to the operating this time for planned right reverse shoulder arthroplasty  Preoperatively I did counsel [name] regarding treatment options as well as the potential risks versus benefits thereof.  Possible surgical complications were reviewed including bleeding, infection, neurovascular injury, persistent pain, loss of motion, anesthetic complication, failure the implant, and possible need for additional surgery.  She understands and accepts and agrees to the planned procedure.  Procedure in detail:  After undergoing routine preop evaluation patient received prophylactic antibiotics and interscalene block with Exparel was established in the holding area by the anesthesia department.  She was noted to have significant symptomatic impairment of diaphragmatic function although did maintain appropriate oxygen saturation levels.  She was brought to the operating  place upon on the op table underwent smooth suction of a general endotracheal anesthesia.  Placed into the beachchair position and appropriately padded protected.  The right shoulder girdle region was sterilely prepped and draped in standard fashion.  Timeout was called.  An anterior deltopectoral approach to the right shoulder is made through an 8 cm incision.  Skin flaps elevated dissection carried deeply the deltopectoral interval was then developed with the vein taken laterally and the pectoralis major retracted medially upper centimeter tenotomized to improve exposure the conjoined tendon retracted medially.  Bicep tendon was then tenotomized and unroofed and excised proximally and then I performed a subscapularis "peel" and tagged free margin with grasping suture tape sutures.  The capsular attachments from the anterior and inferior margins of the humeral neck were then divided and humeral head was delivered through the wound.  Extra medullary guide used to outline the proposed humeral head resection and this was then performed with an oscillating saw and the remaining osteophytes in the margin of the humeral neck were then removed.  A metal cap placed at the cut proximal humeral surface and at this point we exposed the glenoid with a combination of Fukuda, pitchfork, inspecting retractors.  I performed a circumferential labral resection gaining complete visualization of the periphery of the glenoid.  A guidepin was then directed into the center of the glenoid with an approximately 10 degree inferior tilt and once this was seated we performed central and peripheral reaming obtaining a set stable subchondral bony bed for implant.  The central drill hole was then overdrilled and tapped and on the back table we assembled our final implant which was a small baseplate with a 30 mm lag screw and this was then inserted with excellent fit and fixation.  We then placed  a 36 x +4 glenosphere onto the baseplate is which  was impacted and then locking screw was then placed.  We then returned our attention to the proximal humerus where the canal was prepared hand reaming to size 6 and then broaching to size 6 which showed excellent fit and fixation.  The metaphysis was then reamed with a central reamer.  Trial was assembled and impacted in position and trial reduction showed good soft tissue balance.  Our final implant was then assembled and impacted with excellent fit fixation.  We then performed again trial reductions in the +3 polyethylene insert gave [place] the best soft tissue balance.  A final +3 poly-was then impacted into the stem final reduction was then performed with excellent soft tissue balance and excellent shoulder motion and stability.  At this point the subscapularis was then repaired back to the lesser tuberosity utilizing the eyelets on the collar of our humeral stem.  Shoulder motion showed excellent stability.  The wound was irrigated hemostasis was obtained.  The deltopectoral interval was then closed with a series of interrupted figure-of-eight #1 Vicryl sutures.  2-0 Vicryl used for the subcu layer intracuticular 3 Monocryl for the skin followed by Dermabond and Aquasol dressing the right arm was then placed into a sling and the patient was awakened and maintained intubated due to her pulmonary status and transferred to the ICU for observation and anticipated weaning and extubation once her pulmonary status had appropriately stabilized.  Jenetta Loges, PA-C was used as an Environmental consultant throughout this case essential for help with positioning patient, position extremity, tissue manipulation, implantation the prosthesis, wound closure, and intraoperative decision-making.  Metta Clines Blandon Offerdahl MD   Contact # [phone]

## 2018-06-07 NOTE — Consult Note (Addendum)
PULMONARY / CRITICAL CARE MEDICINE   NAME:  [name], MRN:  [id number], DOB:  May 23, 1941, LOS: 0 ADMISSION DATE:  06/07/2018, CONSULTATION DATE:  1/2 REFERRING MD:  Dr. Onnie Graham, CHIEF COMPLAINT:  S/P Arthroplasty   HISTORY OF PRESENT ILLNESS   78 year old female presents to Hospital on 1/2 for a right shoulder  arthroplasty due to osteoarthritis with shoulder pain. Post-operatively remained intubated. PCCM consulted for vent management.   SIGNIFICANT PAST MEDICAL HISTORY   Insterstitial pneumonitis, Anxiety, Asthma, GERD   SIGNIFICANT EVENTS:  1/2 > Presents to Hospital for right shoulder arthroplasty   STUDIES:   CXR 1/2 >>  CULTURES:  N/A   ANTIBIOTICS:  Ancef 1/2  LINES/TUBES:  ETT 1/2 >>  CONSULTANTS:  PCCM 1/2   SUBJECTIVE:  Arrived to ICU intubated/sedated on propofol gtt   CONSTITUTIONAL: BP (!) 166/139 Comment: Dr.  Valma Cava aware.  Pulse (!) 104   Temp 98.7 F (37.1 C) (Oral)   Resp 20   Ht _0  (1.676 m)   SpO2 94%   BMI 35.77 kg/m   No intake/output data recorded.  PHYSICAL EXAM: General:  Elderly female, on vent  Neuro:  Intubated, does not follow commands HEENT:  Dry MM, ETT in place  Cardiovascular:  RRR, no MRG  Lungs: Diminished breath sounds, no wheeze/crackles  Abdomen:  Non-distended, active bowel sounds Musculoskeletal:  -edema  Skin:  Warm, dry, intact   RESOLVED PROBLEM LIST   ASSESSMENT AND PLAN    Respiratory Insufficieny in post-operative setting Chronic Hypoxic Respiratory Failure on supplemental oxygen (3L [employer])  H/O Interstitial Pneumonitis, Asthma > November 2018 pulmonary function testing ratio 90%, FVC 1.72 L 55% predicted, total lung capacity 3.37 L 61% predicted, DLCO 11.2 39% predicted Plan  -Vent Support > will perform SBT > Goal to Extubate today  -Trend ABG/CXR -Wean Supplemental Oxygen to Maintain Saturation >88  -Continue home Pulmicort, PRN Albuterol -Pulmonary Hygiene > Once extubated IS,  OOB  GERD Plan  -PPI   S/P Right Shoulder Arthroplasty Plan  -Per Ortho   Acute Surgical Pain/Sedation Needs Plan -Precedex if needed to maintain RASS 0/-1  -PRN Fentanyl  -PRN Oxycodone   SUMMARY OF TODAY'S PLAN:  ICU care, will wean when able   Best Practice / Goals of Care / Disposition.   DVT PROPHYLAXIS: Heparin  SUP: PPI  NUTRITION:NPO MOBILITY: Bedrest  GOALS OF CARE: pending  FAMILY DISCUSSIONS: pending  DISPOSITION Continue ICU care.   LABS  Glucose No results for input(s): GLUCAP in the last 168 hours.  BMET Recent Labs  Lab 05/31/18 1531  NA 141  K 3.8  CL 102  CO2 30  BUN 12  CREATININE 0.52  GLUCOSE 89    Liver Enzymes No results for input(s): AST, ALT, ALKPHOS, BILITOT, ALBUMIN in the last 168 hours.  Electrolytes Recent Labs  Lab 05/31/18 1531  CALCIUM 9.1    CBC Recent Labs  Lab 05/31/18 1531  WBC 11.4*  HGB 13.5  HCT 44.5  PLT 348    ABG No results for input(s): PHART, PCO2ART, PO2ART in the last 168 hours.  Coag's No results for input(s): APTT, INR in the last 168 hours.  Sepsis Markers No results for input(s): LATICACIDVEN, PROCALCITON, O2SATVEN in the last 168 hours.  Cardiac Enzymes No results for input(s): TROPONINI, PROBNP in the last 168 hours.  PAST MEDICAL HISTORY :   She  has a past medical history of Allergic rhinitis, Anxiety, Arthritis, Asthma, Carpal tunnel syndrome, CHF (congestive  heart failure) ([place]), GERD (gastroesophageal reflux disease), History of bronchitis (3-46yr ago), History of colon polyps, Joint pain, Peripheral edema, Pneumonitis, hypersensitivity ([place] (05[place]), Pulmonary hypertension [place] (06/[place]), Shortness of breath dyspnea, and Thyroid nodule.  PAST SURGICAL HISTORY:  She  has a past surgical history that includes Umbilical hernia repair; Knee arthroscopy w/ meniscal repair; CARPELL TUNNELL SURG (Right); Total knee arthroplasty (Left, 08/13/2013); Breast cyst excision (Left, 50 yrs  ago); Colonoscopy; Esophagogastroduodenoscopy; Video assisted thoracoscopy (Right, 11/20/2014); Lung biopsy (Right, 11/20/2014); and Eye surgery.  Allergies  Allergen Reactions  . Adhesive [Tape] Itching    Rash  . Latex Itching and Swelling  . Doxycycline Rash  . Sulfonamide Derivatives Other (See Comments)    Unknown    No current facility-administered medications on file prior to encounter.    Current Outpatient Medications on File Prior to Encounter  Medication Sig  . acetaminophen (TYLENOL) 650 MG CR tablet Take 650-1,300 mg by mouth every 8 (eight) hours as needed for pain.  .Marland Kitchenalbuterol (PROVENTIL HFA;VENTOLIN HFA) 108 (90 Base) MCG/ACT inhaler Inhale 2 puffs into the lungs every 6 (six) hours as needed for wheezing or shortness of breath.  .Marland Kitchenaspirin 325 MG tablet Take 650 mg by mouth every 6 (six) hours as needed for mild pain or moderate pain.   . fluticasone (FLONASE) 50 MCG/ACT nasal spray Place 1 spray into both nostrils daily as needed for allergies.   . furosemide (LASIX) 20 MG tablet Take 20 mg by mouth daily as needed for edema.   .Marland Kitchenibuprofen (ADVIL,MOTRIN) 200 MG tablet Take 400 mg by mouth every 6 (six) hours as needed for mild pain or moderate pain.   .Marland Kitchenloratadine (CLARITIN) 5 MG chewable tablet Chew 5 mg by mouth daily as needed for allergies.  . sodium chloride HYPERTONIC 3 % nebulizer solution Take by nebulization 2 (two) times daily. (Patient taking differently: Take 4 mLs by nebulization 2 (two) times daily. )  . Respiratory Therapy Supplies (FLUTTER) DEVI Use as directed.    FAMILY HISTORY:   Her family history includes Tuberculosis in her father and paternal uncle.  SOCIAL HISTORY:  She  reports that she has never smoked. She has never used smokeless tobacco. She reports that she does not drink alcohol or use drugs.  REVIEW OF SYSTEMS:    Unable to review as patient is intubated and sedated   CC Time: 42 minutes   KHayden Pedro AGACNP-BC [place] Pgr: [phone] PCCM Pgr: [phone]

## 2018-06-07 NOTE — Procedures (Signed)
Extubation Procedure Note  Patient Details:   Name: [name] DOB: 1940-10-25 MRN: [id number]   Airway Documentation:    Vent end date: 06/07/18 Vent end time: 1606   Evaluation  O2 sats: stable throughout Complications: No apparent complications Patient did tolerate procedure well.     Yes  Tamera Reason 06/07/2018, 4:07 PM

## 2018-06-07 NOTE — Transfer of Care (Signed)
Immediate Anesthesia Transfer of Care Note  Patient: [name]  Procedure(s) Performed: REVERSE SHOULDER ARTHROPLASTY (Right Shoulder)  Patient Location: ICU  Anesthesia Type:General and GA combined with regional for post-op pain  Level of Consciousness: sedated and Patient remains intubated per anesthesia plan  Airway & Oxygen Therapy: Patient Spontanous Breathing, Patient remains intubated per anesthesia plan and Patient placed on Ventilator (see vital sign flow sheet for setting)  Post-op Assessment: Report given to RN  Post vital signs: Reviewed and stable  Last Vitals:  Vitals Value Taken Time  BP    Temp    Pulse    Resp    SpO2      Last Pain:  Vitals:   06/07/18 1214  TempSrc:   PainSc: 0-No pain         Complications: No apparent anesthesia complications

## 2018-06-07 NOTE — Anesthesia Procedure Notes (Signed)
Anesthesia Regional Block: Interscalene brachial plexus block   Pre-Anesthetic Checklist: ,, timeout performed, Correct Patient, Correct Site, Correct Laterality, Correct Procedure, Correct Position, site marked, Risks and benefits discussed,  Surgical consent,  Pre-op evaluation,  At surgeon's request and post-op pain management  Laterality: Upper and Right  Prep: Maximum Sterile Barrier Precautions used, chloraprep       Needles:  Injection technique: Single-shot  Needle Type: Echogenic Needle     Needle Length: 5cm  Needle Gauge: 21     Additional Needles:   Procedures:,,,, ultrasound used (permanent image in chart),,,,  Narrative:  Start time: 06/07/2018 12:15 PM End time: 06/07/2018 12:25 PM Injection made incrementally with aspirations every 5 mL.  Performed by: Personally  Anesthesiologist: Barnet Glasgow, MD  Additional Notes: Block assessed prior to procedure. Patient tolerated procedure well.

## 2018-06-07 NOTE — Progress Notes (Signed)
AssistedDr. Valma Cava with right, ultrasound guided, interscalene  block. Side rails up, monitors on throughout procedure. See vital signs in flow sheet. Tolerated Procedure well.

## 2018-06-07 NOTE — Progress Notes (Signed)
Pt stated that they were feeling very anxious and requested medication to help relax. Spoke to Ortho PA Mechele Claude who ordered PRN valium 19m. Will continue to monitor.

## 2018-06-07 NOTE — Anesthesia Procedure Notes (Addendum)
Procedure Name: Intubation Date/Time: 06/07/2018 12:58 PM Performed by: Lavina Hamman, CRNA Pre-anesthesia Checklist: Patient identified, Emergency Drugs available, Suction available, Patient being monitored and Timeout performed Patient Re-evaluated:Patient Re-evaluated prior to induction Oxygen Delivery Method: Circle system utilized Preoxygenation: Pre-oxygenation with 100% oxygen Induction Type: IV induction Ventilation: Mask ventilation without difficulty Laryngoscope Size: Mac and 3 Grade View: Grade I Tube type: Oral Tube size: 7.0 mm Number of attempts: 1 Airway Equipment and Method: Stylet Placement Confirmation: ETT inserted through vocal cords under direct vision,  positive ETCO2,  CO2 detector and breath sounds checked- equal and bilateral Secured at: 21 cm Tube secured with: Tape Dental Injury: Teeth and Oropharynx as per pre-operative assessment  Comments: ATOI.  Pt with increased WOB due to ISB.  Very anxious.  Possible post operative ventilation vs BIPAP per anesthesiologist.

## 2018-06-07 NOTE — H&P (Signed)
[name]    Chief Complaint: right shoulder osteoarthritis HPI: The patient is a 78 y.o. female with end stage right shoulder osteoarthritis and severely restricted mobility and underlying chronic rotator cuff dysfunction.  Due to her increasing pain and functional limitation she is brought to the operating room at this time for planned right shoulder reverse arthroplasty  Past Medical History:  Diagnosis Date  . Allergic rhinitis    takes Claritin nightly  . Anxiety    but not on any meds  . Arthritis   . Asthma   . Carpal tunnel syndrome   . CHF (congestive heart failure) ([place])    patient denies-in office note 05/2015 by Aleene Davidson  . GERD (gastroesophageal reflux disease)   . History of bronchitis 3-45yr ago  . History of colon polyps    benign  . Joint pain   . Peripheral edema    takes Lasix daily  . Pneumonitis, hypersensitivity ([place] 05/2016   chronic hypersensitivity pneumonitis- Dr. MSpero Curb . [place]monary hypertension [place] 06/30/2017   patient denies-in office note from Dr. MSpero Curb  . Shortness of breath dyspnea    with exertion;Albuterol inhaler as needed  . Thyroid nodule     Past Surgical History:  Procedure Laterality Date  . BREAST CYST EXCISION Left 50 yrs ago  . CARPELL TUNNELL SURG Right   . COLONOSCOPY    . ESOPHAGOGASTRODUODENOSCOPY    . EYE SURGERY     bilateral cataract surgery with lens implants  . KNEE ARTHROSCOPY W/ MENISCAL REPAIR     left  . LUNG BIOPSY Right 11/20/2014   Procedure: LUNG BIOPSY;  Surgeon: SMelrose Nakayama MD;  [facility]  Service: Thoracic;  Laterality: Right;  . TOTAL KNEE ARTHROPLASTY Left 08/13/2013   Procedure: LEFT TOTAL KNEE ARTHROPLASTY;  Surgeon: MMauri Pole MD;  Location: [facility];  Service: Orthopedics;  Laterality: Left;  . UMBILICAL HERNIA REPAIR    . VIDEO ASSISTED THORACOSCOPY Right 11/20/2014   Procedure: RIGHT  VIDEO ASSISTED THORACOSCOPY WITH WEDGE LUNG BIOPSIES RIGHT UPPER,MIDDLE AND  LOWER LOBES ,PLACEMENT OF ON Q PAIN PUMP;  Surgeon: SMelrose Nakayama MD;  [facility]  Service: Thoracic;  Laterality: Right;    Family History  Problem Relation Age of Onset  . Tuberculosis Father   . Tuberculosis Paternal Uncle     Social History:  reports that she has never smoked. She has never used smokeless tobacco. She reports that she does not drink alcohol or use drugs.   Medications Prior to Admission  Medication Sig Dispense Refill  . acetaminophen (TYLENOL) 650 MG CR tablet Take 650-1,300 mg by mouth every 8 (eight) hours as needed for pain.    .Marland Kitchenalbuterol (PROVENTIL HFA;VENTOLIN HFA) 108 (90 Base) MCG/ACT inhaler Inhale 2 puffs into the lungs every 6 (six) hours as needed for wheezing or shortness of breath. 1 Inhaler 5  . aspirin 325 MG tablet Take 650 mg by mouth every 6 (six) hours as needed for mild pain or moderate pain.     . fluticasone (FLONASE) 50 MCG/ACT nasal spray Place 1 spray into both nostrils daily as needed for allergies.     . furosemide (LASIX) 20 MG tablet Take 20 mg by mouth daily as needed for edema.     .Marland Kitchenibuprofen (ADVIL,MOTRIN) 200 MG tablet Take 400 mg by mouth every 6 (six) hours as needed for mild pain or moderate pain.     .Marland Kitchenloratadine (CLARITIN) 5 MG chewable tablet Chew  5 mg by mouth daily as needed for allergies.    . sodium chloride HYPERTONIC 3 % nebulizer solution Take by nebulization 2 (two) times daily. (Patient taking differently: Take 4 mLs by nebulization 2 (two) times daily. ) 750 mL 12  . Respiratory Therapy Supplies (FLUTTER) DEVI Use as directed. 1 each 0     Physical Exam: Right shoulder demonstrates severely restricted and painful motion.  Plain radiographs confirm complete obliteration of joint space with subchondral sclerosis and peripheral osteophyte formation.  Vitals  Temp:  [98.7 F (37.1 C)] 98.7 F (37.1 C) (01/02 1132) Pulse Rate:  [91] 91 (01/02 1132) Resp:  [15] 15 (01/02 1132) BP: (160)/(65) 160/65  (01/02 1132) SpO2:  [98 %] 98 % (01/02 1132)  Assessment/Plan  Impression: right shoulder osteoarthritis  Plan of Action: Procedure(s): REVERSE SHOULDER ARTHROPLASTY  Tzipporah Nagorski M Lorenia Hoston 06/07/2018, 12:01 PM Contact # [phone]

## 2018-06-07 NOTE — Anesthesia Postprocedure Evaluation (Signed)
Anesthesia Post Note  Patient: Kaylanni G Payette  Procedure(s) Performed: REVERSE SHOULDER ARTHROPLASTY (Right Shoulder)     Patient location during evaluation: ICU Anesthesia Type: General Level of consciousness: patient remains intubated per anesthesia plan Pain management: pain level controlled Vital Signs Assessment: post-procedure vital signs reviewed and stable Respiratory status: spontaneous breathing, respiratory function stable and patient connected to nasal cannula oxygen Cardiovascular status: blood pressure returned to baseline and stable Postop Assessment: no apparent nausea or vomiting Anesthetic complications: no    Last Vitals:  Vitals:   06/07/18 1230 06/07/18 1245  BP: (!) 166/139   Pulse: (!) 103 (!) 104  Resp: 20 20  Temp:    SpO2: 94% 94%    Last Pain:  Vitals:   06/07/18 1214  TempSrc:   PainSc: 0-No pain                 Barnet Glasgow

## 2018-06-08 ENCOUNTER — Telehealth: Payer: Self-pay | Admitting: Pulmonary Disease

## 2018-06-08 DIAGNOSIS — J9611 Chronic respiratory failure with hypoxia: Secondary | ICD-10-CM

## 2018-06-08 LAB — GLUCOSE, CAPILLARY
Glucose-Capillary: 131 mg/dL — ABNORMAL HIGH (ref 70–99)
Glucose-Capillary: 142 mg/dL — ABNORMAL HIGH (ref 70–99)
Glucose-Capillary: 135 mg/dL — ABNORMAL HIGH (ref 70–99)

## 2018-06-08 MED ORDER — TRAMADOL HCL 50 MG PO TABS: 50.0000 mg | ORAL_TABLET | Freq: Four times a day (QID) | ORAL | 0 refills | Status: DC | PRN

## 2018-06-08 MED ORDER — HYDRALAZINE HCL 20 MG/ML IJ SOLN
5.0000 mg | Freq: Once | INTRAMUSCULAR | Status: AC
  Administered 2018-06-08: 5 mg via INTRAVENOUS
  Filled 2018-06-08: qty 1

## 2018-06-08 MED ORDER — ONDANSETRON HCL 4 MG PO TABS: 4.0000 mg | ORAL_TABLET | Freq: Three times a day (TID) | ORAL | 0 refills | Status: DC | PRN

## 2018-06-08 NOTE — Progress Notes (Signed)
Discharge instruction proved to and discussed with patient. OT worked with patient this AM and went over therapy instructions. All questions answered. IV removed, pt taken to entrance via wheelchair by NT at 1115.

## 2018-06-08 NOTE — Progress Notes (Signed)
PULMONARY / CRITICAL CARE MEDICINE   NAME:  [name], MRN:  [id number], DOB:  1941-01-29, LOS: 1 ADMISSION DATE:  06/07/2018, CONSULTATION DATE:  1/2 REFERRING MD:  Dr. Onnie Graham, CHIEF COMPLAINT:  S/P Arthroplasty   HISTORY OF PRESENT ILLNESS   78 year old female presents to Hospital on 1/2 for a right shoulder  arthroplasty due to osteoarthritis with shoulder pain. Post-operatively remained intubated. PCCM consulted for vent management.   SIGNIFICANT PAST MEDICAL HISTORY   Insterstitial pneumonitis, Anxiety, Asthma, GERD   SIGNIFICANT EVENTS:  1/2 > Presents to Hospital for right shoulder arthroplasty   STUDIES:   CXR 1/2 >>  CULTURES:  N/A   ANTIBIOTICS:  Ancef 1/2  LINES/TUBES:  ETT 1/2 >>1/2  CONSULTANTS:  PCCM 1/2   SUBJECTIVE:  No issues overnight.  No respiratory complaints this morning.  Her shoulder is sore.  Patient was up painting her fingernails this morning.  CONSTITUTIONAL: BP (!) 148/47   Pulse 90   Temp 98.1 F (36.7 C) (Oral)   Resp (!) 23   Ht 5' 6" (1.676 m)   SpO2 98%   BMI 35.77 kg/m   I/O last 3 completed shifts: In: 933.2 [P.O.:200; I.V.:733.2] Out: 1950 [Urine:1900; Blood:50]  PHYSICAL EXAM: General: Early female, resting in bed, no apparent distress Neuro: Awake alert oriented, following commands, no focal deficit HEENT: NCAT, sclera clear, pupils reactive, tracking appropriately Cardiovascular: Regular rate and rhythm, S1-S2, no murmurs gallops Lungs: Crackles within the bilateral bases, no wheeze Abdomen: Soft nondistended nontender Musculoskeletal: No edema Skin: No obvious rash, no bruising no petechia  RESOLVED PROBLEM LIST   ASSESSMENT AND PLAN    Acute on chronic hypoxemic respiratory failure secondary to underlying chronic diffuse parenchymal lung disease, chronic hypersensitivity pneumonitis, bronchiectasis. Continue on home O2 therapy 3 L nasal cannula Continue Pulmicort nebs twice daily Continue albuterol nebs  every 4 hours as needed Discharge on home regimen of hypertonic saline nebs twice daily We will arrange pulmonary follow-up Pulmonary will sign off at this time. Postop discharge instructions per orthopedic surgery We appreciate the consultation.  Please feel free to give [place] a call with any questions.  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 Onnie Graham -Postop care per orthopedic surgery -Pain control with opiates per surgery service   SUMMARY OF TODAY'S PLAN:  Discharge per primary   Best Practice / Goals of Care / Disposition.   DVT PROPHYLAXIS: Heparin  SUP: PPI  NUTRITION:NPO MOBILITY: Bedrest  GOALS OF CARE: pending  FAMILY DISCUSSIONS: pending  DISPOSITION per orthopedics  LABS  Glucose Recent Labs  Lab 06/08/18 0055 06/08/18 0605 06/08/18 0749  GLUCAP 131* 142* 135*    BMET No results for input(s): NA, K, CL, CO2, BUN, CREATININE, GLUCOSE in the last 168 hours.  Liver Enzymes No results for input(s): AST, ALT, ALKPHOS, BILITOT, ALBUMIN in the last 168 hours.  Electrolytes No results for input(s): CALCIUM, MG, PHOS in the last 168 hours.  CBC No results for input(s): WBC, HGB, HCT, PLT in the last 168 hours.  ABG Recent Labs  Lab 06/07/18 1518  PHART 7.412  PCO2ART 43.1  PO2ART 288*    Coag's No results for input(s): APTT, INR in the last 168 hours.  Sepsis Markers No results for input(s): LATICACIDVEN, PROCALCITON, O2SATVEN in the last 168 hours.  Cardiac Enzymes No results for input(s): TROPONINI, PROBNP in the last 168 hours.   Garner Nash, DO [facility] 06/08/2018 8:06 AM  Personal pager: #  [phone] If unanswered, please page CCM On-call: [phone]

## 2018-06-08 NOTE — Care Management Note (Signed)
Case Management Note  Patient Details  Name: [name] MRN: [id number] Date of Birth: 05/14/1941  Subjective/Objective:                  discharged  Action/Plan: Discharged to home with self-care, orders checked for hhc needs. No CM needs present at time of discharge.  Patient is able to arrangement own appointments and home care.  Expected Discharge Date:  06/08/18               Expected Discharge Plan:  Home/Self Care  In-House Referral:     Discharge planning Services  CM Consult  Post Acute Care Choice:    Choice offered to:     DME Arranged:    DME Agency:     HH Arranged:    HH Agency:     Status of Service:  Completed, signed off  If discussed at [employer] of Stay Meetings, dates discussed:    Additional Comments:  Leeroy Cha, RN 06/08/2018, 9:53 AM

## 2018-06-08 NOTE — Telephone Encounter (Signed)
PCCM:  Please schedule hospital follow up with APP in clinic. Patient of Dr. Lake Bells. Recent hospitalization for shoulder surgery.   Joy Nash, DO [facility] 06/08/2018 9:35 AM

## 2018-06-08 NOTE — Telephone Encounter (Signed)
Thanks is good Thanks Garner Nash, DO [facility] 06/08/2018 10:43 AM

## 2018-06-08 NOTE — Evaluation (Signed)
Occupational Therapy Evaluation Patient Details Name: [name] MRN: [id number] DOB: Sep 22, 1940 Today's Date: 06/08/2018    History of Present Illness s/p R RTSA.    Clinical Impression   Pt was admitted for the above sx.  Completed dressing and exercises.  Handouts given for protocol, pendulums/lap slides and parameters she is allowed to work in.  All education was completed. Pt will follow up with Dr Onnie Graham    Follow Up Recommendations  Follow surgeon's recommendation for DC plan and follow-up therapies    Equipment Recommendations  None recommended by OT    Recommendations for Other Services       Precautions / Restrictions Precautions Precautions: Shoulder Type of Shoulder Precautions: sling on except bathing/dressing/exercise and sitting in controlled environment.  Can use RUE for adls within parameters of ER 20, ABd 45 and FF 60 A/AA/PROM.  No IR exercises/no resistance Shoulder Interventions: Shoulder sling/immobilizer Restrictions Weight Bearing Restrictions: Yes(NWB RUE)      Mobility Bed Mobility Overal bed mobility: Modified Independent             General bed mobility comments: HOB raised  Transfers Overall transfer level: Modified independent               General transfer comment: SPT to chair    Balance                                           ADL either performed or assessed with clinical judgement   ADL Overall ADL's : Needs assistance/impaired Eating/Feeding: Set up   Grooming: Set up   Upper Body Bathing: Minimal assistance   Lower Body Bathing: Moderate assistance   Upper Body Dressing : Moderate assistance   Lower Body Dressing: Maximal assistance   Toilet Transfer: Supervision/safety(SPT to chair)             General ADL Comments: Pt cannot follow parameters for LB adls and needs help (for shoulder rotation). She will have family help     Vision         Perception     Praxis       Pertinent Vitals/Pain Pain Assessment: Faces Faces Pain Scale: Hurts little more Pain Location: R shoulder Pain Descriptors / Indicators: Discomfort Pain Intervention(s): Limited activity within patient's tolerance;Monitored during session;Premedicated before session;Repositioned     Hand Dominance     Extremity/Trunk Assessment Upper Extremity Assessment Upper Extremity Assessment: RUE deficits/detail RUE Deficits / Details: neutral rotation, able to abd and FF approximately 20           Communication     Cognition Arousal/Alertness: Awake/alert Behavior During Therapy: WFL for tasks assessed/performed Overall Cognitive Status: Within Functional Limits for tasks assessed                                     General Comments       Exercises Exercises: Other exercises Other Exercises Other Exercises: performed lap slides independently and pendulums; sitting better due to bad knee   Shoulder Instructions      Home Living Family/patient expects to be discharged to:: Private residence Living Arrangements: Alone Available Help at Discharge: Family               Bathroom Shower/Tub: Walk-in Corporate treasurer Toilet: Handicapped height  Additional Comments: multiple family members will assist      Prior Functioning/Environment                   OT Problem List:        OT Treatment/Interventions:      OT Goals(Current goals can be found in the care plan section) Acute Rehab OT Goals Patient Stated Goal: return to independence OT Goal Formulation: All assessment and education complete, DC therapy  OT Frequency:     Barriers to D/C:            Co-evaluation              AM-PAC OT "6 Clicks" Daily Activity     Outcome Measure Help from another person eating meals?: A Little Help from another person taking care of personal grooming?: A Little Help from another person toileting, which includes using toliet,  bedpan, or urinal?: A Lot Help from another person bathing (including washing, rinsing, drying)?: A Lot Help from another person to put on and taking off regular upper body clothing?: A Lot Help from another person to put on and taking off regular lower body clothing?: A Lot 6 Click Score: 14   End of Session    Activity Tolerance: Patient tolerated treatment well Patient left: in chair;with call bell/phone within reach  OT Visit Diagnosis: Pain Pain - Right/Left: Right Pain - part of body: Shoulder                Time: 0355-9741 OT Time Calculation (min): 40 min Charges:  OT General Charges $OT Visit: 1 Visit OT Evaluation $OT Eval Low Complexity: 1 Low OT Treatments $Self Care/Home Management : 8-22 mins $Therapeutic Exercise: 8-22 mins  Lesle Chris, OTR/L Acute Rehabilitation Services [phone] [facility] pager [phone] office 06/08/2018  [place] 06/08/2018, 12:24 PM

## 2018-06-08 NOTE — Telephone Encounter (Signed)
Pt has a pending OV with BQ on 06/22/18. Is this okay or does she need to be seen before then?

## 2018-06-08 NOTE — Discharge Instructions (Signed)
Metta Clines. Supple, M.D., F.A.A.O.S. Orthopaedic Surgery Specializing in Arthroscopic and Reconstructive Surgery of the Shoulder and Knee [phone] [address] [place], [place] [address] - Fax [phone]   POST-OP TOTAL SHOULDER REPLACEMENT INSTRUCTIONS  1. Call the office at [phone] to schedule your first post-op appointment 10-14 days from the date of your surgery.  2. The bandage over your incision is waterproof. You may begin showering with this dressing on. You may leave this dressing on until first follow up appointment within 2 weeks. We prefer you leave this dressing in place until follow up however after 5-7 days if you are having itching or skin irritation and would like to remove it you may do so. Go slow and tug at the borders gently to break the bond the dressing has with the skin. At this point if there is no drainage it is okay to go without a bandage or you may cover it with a light guaze and tape. You can also expect significant bruising around your shoulder that will drift down your arm and into your chest wall. This is very normal and should resolve over several days.   3. Wear your sling/immobilizer at all times except to perform the exercises below or to occasionally let your arm dangle by your side to stretch your elbow. You also need to sleep in your sling immobilizer until instructed otherwise.  4. Range of motion to your elbow, wrist, and hand are encouraged 3-5 times daily. Exercise to your hand and fingers helps to reduce swelling you may experience.  5. Utilize ice to the shoulder 3-5 times minimum a day and additionally if you are experiencing pain.  6. Prescriptions for a pain medication and a muscle relaxant are provided for you. It is recommended that if you are experiencing pain that you pain medication alone is not controlling, add the muscle relaxant along with the pain medication which can give additional pain relief. The first 1-2 days  is generally the most severe of your pain and then should gradually decrease. As your pain lessens it is recommended that you decrease your use of the pain medications to an "as needed basis'" only and to always comply with the recommended dosages of the pain medications.  7. Pain medications can produce constipation along with their use. If you experience this, the use of an over the counter stool softener or laxative daily is recommended.   8. For additional questions or concerns, please do not hesitate to call the office. If after hours there is an answering service to forward your concerns to the physician on call.  9.Pain control following an exparel block  To help control your post-operative pain you received a nerve block  performed with Exparel which is a long acting anesthetic (numbing agent) which can provide pain relief and sensations of numbness (and relief of pain) in the operative shoulder and arm for up to 3 days. Sometimes it provides mixed relief, meaning you may still have numbness in certain areas of the arm but can still be able to move  parts of that arm, hand, and fingers. We recommend that your prescribed pain medications  be used as needed. We do not feel it is necessary to "pre medicate" and "stay ahead" of pain.  Taking narcotic pain medications when you are not having any pain can lead to unnecessary and potentially dangerous side effects.   10. OK to resume OTC advil and aspirin as discussed  POST-OP EXERCISES  Pendulum Exercises  Perform pendulum exercises while standing and bending at the waist. Support your uninvolved arm on a table or chair and allow your operated arm to hang freely. Make sure to do these exercises passively - not using you shoulder muscles.  Repeat 20 times. Do 3 sessions per day.

## 2018-06-08 NOTE — Discharge Summary (Signed)
PATIENT ID:      [name]  MRN:     [id number] DOB/AGE:    79/30/1942 / 78 y.o.     DISCHARGE SUMMARY  ADMISSION DATE:    06/07/2018 DISCHARGE DATE:    ADMISSION DIAGNOSIS: right shoulder osteoarthritis Past Medical History:  Diagnosis Date  . Allergic rhinitis    takes Claritin nightly  . Anxiety    but not on any meds  . Arthritis   . Asthma   . Carpal tunnel syndrome   . CHF (congestive heart failure) ([place])    patient denies-in office note 05/2015 by Aleene Davidson  . GERD (gastroesophageal reflux disease)   . History of bronchitis 3-67yr ago  . History of colon polyps    benign  . Joint pain   . Peripheral edema    takes Lasix daily  . [place]   chronic hypersensitivity pneumonitis- Dr. MSpero Curb . Pulmonary hypertension [place] 06/[place]2017   patient denies-in office note from Dr. MSpero Curb  . Shortness of breath dyspnea    with exertion;Albuterol inhaler as needed  . Thyroid nodule     DISCHARGE DIAGNOSIS:   Active Problems:   S/P shoulder replacement   S/P reverse total shoulder arthroplasty, right   PROCEDURE: Procedure(s): REVERSE SHOULDER ARTHROPLASTY on 06/07/2018  CONSULTS:    HISTORY:  See H&P in chart.  HOSPITAL COURSE:  Joy HALLMONis a 78y.o. admitted on 06/07/2018 with a diagnosis of right shoulder osteoarthritis.  They were brought to the operating room on 06/07/2018 and underwent Procedure(s): R[place]    They were given perioperative antibiotics:  Anti-infectives (From admission, onward)   Start     Dose/Rate Route Frequency Ordered Stop   06/07/18 1130  ceFAZolin (ANCEF) IVPB 2g/100 mL premix     2 g 200 mL/hr over 30 Minutes Intravenous On call to O.R. 06/07/18 1117 06/07/18 1300    .  Patient underwent the above named procedure and tolerated it well. The following day they were hemodynamically stable and pain was controlled on oral analgesics. They were neurovascularly  intact to the operative extremity. OT was ordered and worked with patient per protocol. They were medically and orthopaedically stable for discharge on day 1 .     DIAGNOSTIC STUDIES:  RECENT RADIOGRAPHIC STUDIES :  Dg Chest Port 1 View  Result Date: 06/07/2018 CLINICAL DATA:  Intubated patient; status post right shoulder surgery. EXAM: PORTABLE CHEST 1 VIEW COMPARISON:  Chest x-ray of July 14, 2017 FINDINGS: The lungs are less well inflated today. The interstitial markings are increased greatest at the left base. The cardiac silhouette is not clearly enlarged but the margins are indistinct. The central pulmonary vascularity is mildly prominent without definite cephalization. The endotracheal tube tip projects 4.1 cm above the carina. There is a prosthetic right shoulder joint. IMPRESSION: Increased lung markings bilaterally greatest at the left base. This likely reflects a combination of acute atelectasis or pneumonia superimposed upon chronic interstitial fibrotic changes given the appearance of the chest on the previous study. No overt pulmonary edema. The endotracheal tube tip is in reasonable position. Electronically Signed   By: David  JMartiniqueM.D.   On: 06/07/2018 15:23    RECENT VITAL SIGNS:   Patient Vitals for the past 24 hrs:  BP Temp Temp src Pulse Resp SpO2 Height  06/08/18 0800 (!) 157/42 98.1 F (36.7 C) Oral (!) 103 (!) 26 99 % -  06/08/18 0758 - - - - - 98 % -  06/08/18 0612 (!) 148/47 - - 90 (!) 23 98 % -  06/08/18 0510 (!) 171/55 - - 97 (!) 29 95 % -  06/08/18 0400 (!) 192/62 - - 96 (!) 25 95 % -  06/08/18 0327 (!) 177/79 - - 84 19 100 % -  06/08/18 0326 - 97.9 F (36.6 C) Oral - - - -  06/08/18 0206 (!) 165/53 - - 81 (!) 28 97 % -  06/08/18 0057 (!) 144/54 - - 82 14 97 % -  06/08/18 0000 (!) 188/58 - - 86 20 98 % -  06/07/18 2313 - 98.1 F (36.7 C) Oral - - - -  06/07/18 2200 (!) 165/77 - - 85 (!) 26 97 % -  06/07/18 2100 (!) 170/68 - - 92 20 97 % -  06/07/18 2000  (!) 164/53 - - 85 (!) 22 98 % -  06/07/18 1949 - - - - - 97 % -  06/07/18 1939 - 98.2 F (36.8 C) Oral - - - -  06/07/18 1800 (!) 171/66 - - 88 14 94 % -  06/07/18 1700 (!) 162/129 - - 81 16 95 % -  06/07/18 1607 - (!) 97.5 F (36.4 C) Axillary - - - -  06/07/18 1600 (!) 204/75 - - 87 (!) 28 98 % -  06/07/18 1500 - - - 83 (!) 22 100 % -  06/07/18 1400 - - - - - - _0  (1.676 m)  06/07/18 1245 - - - (!) 104 20 94 % -  06/07/18 1230 (!) 166/139 - - (!) 103 20 94 % -  06/07/18 1214 - - - (!) 104 20 95 % -  06/07/18 1209 - - - (!) 102 18 96 % -  06/07/18 1151 - - - - - - _1  (1.676 m)  06/07/18 1132 (!) 160/65 98.7 F (37.1 C) Oral 91 15 98 % -  .  RECENT EKG RESULTS:    Orders placed or performed during the hospital encounter of 05/31/18  . EKG 12 lead  . EKG 12 lead    DISCHARGE INSTRUCTIONS:    DISCHARGE MEDICATIONS:   Allergies as of 06/08/2018      Reactions   Adhesive [tape] Itching   Rash   Latex Itching, Swelling   Doxycycline Rash   Sulfonamide Derivatives Other (See Comments)   Unknown      Medication List    TAKE these medications   acetaminophen 650 MG CR tablet Commonly known as:  TYLENOL Take 650-1,300 mg by mouth every 8 (eight) hours as needed for pain.   albuterol 108 (90 Base) MCG/ACT inhaler Commonly known as:  PROVENTIL HFA;VENTOLIN HFA Inhale 2 puffs into the lungs every 6 (six) hours as needed for wheezing or shortness of breath.   aspirin 325 MG tablet Take 650 mg by mouth every 6 (six) hours as needed for mild pain or moderate pain.   fluticasone 50 MCG/ACT nasal spray Commonly known as:  FLONASE Place 1 spray into both nostrils daily as needed for allergies.   FLUTTER Devi Use as directed.   furosemide 20 MG tablet Commonly known as:  LASIX Take 20 mg by mouth daily as needed for edema.   ibuprofen 200 MG tablet Commonly known as:  ADVIL,MOTRIN Take 400 mg by mouth every 6 (six) hours as needed for mild pain or moderate pain.    loratadine 5 MG chewable tablet Commonly known as:  CLARITIN Chew 5 mg by mouth daily as  needed for allergies.   ondansetron 4 MG tablet Commonly known as:  ZOFRAN Take 1 tablet (4 mg total) by mouth every 8 (eight) hours as needed for nausea or vomiting.   sodium chloride HYPERTONIC 3 % nebulizer solution Take by nebulization 2 (two) times daily. What changed:  how much to take   traMADol 50 MG tablet Commonly known as:  ULTRAM Take 1 tablet (50 mg total) by mouth every 6 (six) hours as needed for moderate pain.       FOLLOW UP VISIT:    DISCHARGE TO: Home   DISCHARGE CONDITION:  Thereasa Parkin Lecretia Buczek for Dr. Lennette Bihari Supple 06/08/2018, 8:40 AM

## 2018-06-12 ENCOUNTER — Encounter (HOSPITAL_COMMUNITY): Payer: Self-pay | Admitting: Orthopedic Surgery

## 2018-06-20 DIAGNOSIS — Z96611 Presence of right artificial shoulder joint: Secondary | ICD-10-CM | POA: Diagnosis not present

## 2018-06-20 DIAGNOSIS — Z4789 Encounter for other orthopedic aftercare: Secondary | ICD-10-CM | POA: Diagnosis not present

## 2018-06-22 ENCOUNTER — Ambulatory Visit (INDEPENDENT_AMBULATORY_CARE_PROVIDER_SITE_OTHER): Payer: Medicare Other | Admitting: Pulmonary Disease

## 2018-06-22 ENCOUNTER — Encounter: Payer: Self-pay | Admitting: Pulmonary Disease

## 2018-06-22 VITALS — BP 136/74 | HR 88 | Ht 67.0 in | Wt 223.0 lb

## 2018-06-22 DIAGNOSIS — J9611 Chronic respiratory failure with hypoxia: Secondary | ICD-10-CM

## 2018-06-22 DIAGNOSIS — J479 Bronchiectasis, uncomplicated: Secondary | ICD-10-CM | POA: Diagnosis not present

## 2018-06-22 DIAGNOSIS — J849 Interstitial pulmonary disease, unspecified: Secondary | ICD-10-CM | POA: Diagnosis not present

## 2018-06-22 DIAGNOSIS — J679 Hypersensitivity pneumonitis due to unspecified organic dust: Secondary | ICD-10-CM

## 2018-06-22 MED ORDER — AMOXICILLIN-POT CLAVULANATE 875-125 MG PO TABS: 1.0000 | ORAL_TABLET | Freq: Two times a day (BID) | ORAL | 0 refills | Status: DC

## 2018-06-22 MED ORDER — PREDNISONE 20 MG PO TABS: 20.0000 mg | ORAL_TABLET | Freq: Every day | ORAL | 0 refills | Status: DC

## 2018-06-22 NOTE — Patient Instructions (Signed)
Bronchiectasis with acute exacerbation Use the hypertonic saline twice a day Take Augmentin twice a day x5 days Take prednisone 3 days Call me if no improvement by next week  Chronic hypersensitivity pneumonitis: I think you need to take a medicine called nintedanib, we will discuss starting this on the next visit  Chronic respiratory failure with hypoxemia: Keep using 3 L of oxygen continuously  We will see you back in 4 to 6 weeks or sooner if needed

## 2018-06-22 NOTE — Progress Notes (Signed)
Subjective:    Patient ID: [name], female    DOB: 1940-09-22, 78 y.o.   MRN: [id number]  Synopsis: Former patient of Dr. 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 She was seen at [facility] for a second opinion and they felt that she had hypersensitivity pneumonitis, chronic form.  She also has a history of rheumatoid arthritis.     HPI Chief Complaint  Patient presents with  . Follow-up    pt c/o prod cough with some yellow mucus, some chest tightness with cough.  had shoulder replacement Xapprox 2 wks ago.     [name] has been feeling poorly since surgery: Specifically she says she has been coughing up yellow mucus and she is had a throat irritation for the last several days.  However, her breathing has been okay and she has not been significantly short of breath.  She has been taking ibuprofen for pain and she would prefer to stay away from the tramadol.  She continues to use and benefit from her oxygen.  She was discharged home but while hospitalized the pulmonary and critical care team helped her.   Past Medical History:  Diagnosis Date  . Allergic rhinitis    takes Claritin nightly  . Anxiety    but not on any meds  . Arthritis   . Asthma   . Carpal tunnel syndrome   . CHF (congestive heart failure) ([place])    patient denies-in office note 05/2015 by Aleene Davidson  . GERD (gastroesophageal reflux disease)   . History of bronchitis 3-65yr ago  . History of colon polyps    benign  . Joint pain   . Peripheral edema    takes Lasix daily  . [place]itis, hypersensitivity ([place] 05[place]   chronic hypersensitivity pneumonitis- Dr. MSpero Curb . Pulmonary hypertension ([place] 06/30/2017   patient denies-in office note from Dr. MSpero Curb  . Shortness of breath dyspnea    with exertion;Albuterol inhaler as needed  . Thyroid nodule       Review of Systems  Constitutional: Negative  for chills, fatigue and fever.  HENT: Negative for postnasal drip, rhinorrhea and sinus pressure.   Respiratory: Positive for cough and wheezing. Negative for shortness of breath.   Cardiovascular: Negative for chest pain, palpitations and leg swelling.       Objective:   Physical Exam Vitals:   06/22/18 1519  BP: 136/74  Pulse: 88  SpO2: 98%  Weight: 223 lb (101.2 kg)  Height: _0  (1.702 m)   3L pulse flow  Gen: chronically ill appearing HENT: OP clear, TM's clear, neck supple PULM: Wheezing crackles bilaterally B, normal percussion CV: RRR, no mgr, trace edema GI: BS+, soft, nontender Derm: no cyanosis or rash Psyche: normal mood and affect    CBC    Component Value Date/Time   WBC 11.4 (H) 05/31/2018 1531   RBC 4.75 05/31/2018 1531   HGB 13.5 05/31/2018 1531   HCT 44.5 05/31/2018 1531   PLT 348 05/31/2018 1531   MCV 93.7 05/31/2018 1531   MCH 28.4 05/31/2018 1531   MCHC 30.3 05/31/2018 1531   RDW 13.8 05/31/2018 1531   LYMPHSABS 3.5 04/19/2017 1617   MONOABS 0.9 04/19/2017 1617   EOSABS 0.7 04/19/2017 1617   BASOSABS 0.1 04/19/2017 1617   Imaging: 06/2016 CT angiogram> no PE, superimposed ground glass changes in addition to baseline pulmonary fibrosis  Pulmonary function test: PFT's 2009:  FEV1 1.97 (84%),ratio 70, 22% increase FEV1 with BD, TLC 4.44 (82%), DLCO 16.7 (62%) 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  PFT's January 2016:  FVC 2.13 (66%), no obstruction, TLC 4.00 (72%), DLCO 13.70 (48%) March 2017 pulmonary function testing ratio 88%, FVC 2.03 L (84% predicted), total lung capacity 3.0 L (54% predicted), DLCO 14.7 (51% predicted) April 2018 ratio 89%, FVC 1.91 L 61% predicted, total lung capacity 3.49 L 63% predicted, DLCO 12.69 44% predicted November 2018 pulmonary function testing ratio 90%, FVC 1.72 L 55% predicted, total lung capacity 3.37 L 61%  predicted, DLCO 11.2 39% predicted  6MW 02/2015 6 Min Walk: 1218 feet. The patient's lowest oxygen saturation was 90 %, highest heart rate was 118 bpm , and highest blood pressure was 180/80 07/2016 6 MW> 1320 feet 3 L O2 88%  Chest imaging: CT chest 2008:  Mild bronchiectasis, ?NSIP: CXR 05/2014:  Progressive IS changes compared to 2012. HRCT 04/2017: traction bronchiectasis and bronchovascular and peripheral fibrotic changes, worse in the upper lobes, some air trapping, no significant progression compared to 09/2016 November 2019 high-resolution CT scan of the chest images independently reviewed showing slight progression of her upper lobe predominant air trapping and fibrotic lung disease consistent with hypersensitivity pneumonitis  HRCT 2016:  Changes c/w fibrotic NSIP?  A little worse compared to 2008 Autoimmune panel 2016:  negative  Path: 11/2014 Open Lung biopsy PATH= UIP with granuloma  Microbiology: July 2019 sputum culture: Bacterial culture positive for oropharyngeal flora, fungal culture showed yeast, non-speciated, AFB culture negative      Assessment & Plan:  Bronchiectasis without acute exacerbation (HCC)  ILD (interstitial lung disease) (HCC)  Chronic respiratory failure with hypoxia (HCC)  Hypersensitivity pneumonia (HCC)   Discussion: Unfortunately she is having an exacerbation of bronchiectasis since her hospitalization.  She is having some throat soreness as well.  I think that that throat soreness was most likely related to the intubation for seizure.  In general she is recovering fairly well otherwise though.  We talked about using nintedanib to slow the progression of her chronic hypersensitivity pneumonitis and she says that she is interested in this.  However, she would like to start nintedanib later in the spring because right now she is dealing with quite a bit in her family.  Plan: Bronchiectasis with acute exacerbation Use the hypertonic saline  twice a day Take Augmentin twice a day x5 days Take prednisone 3 days Call me if no improvement by next week  Chronic hypersensitivity pneumonitis: I think you need to take a medicine called nintedanib, we will discuss starting this on the next visit  Chronic respiratory failure with hypoxemia: Keep using 3 L of oxygen continuously  We will see you back in 4 to 6 weeks or sooner if needed  Greater than 50% of this 30-minute visit was spent face-to-face   Current Outpatient Medications:  .  acetaminophen (TYLENOL) 650 MG CR tablet, Take 650-1,300 mg by mouth every 8 (eight) hours as needed for pain., Disp: , Rfl:  .  albuterol (PROVENTIL HFA;VENTOLIN HFA) 108 (90 Base) MCG/ACT inhaler, Inhale 2 puffs into the lungs every 6 (six) hours as needed for wheezing or shortness of breath., Disp: 1 Inhaler, Rfl: 5 .  aspirin 325 MG tablet, Take 650 mg by mouth every 6 (six) hours as needed for mild pain or moderate pain. , Disp: , Rfl:  .  fluticasone (FLONASE)  50 MCG/ACT nasal spray, Place 1 spray into both nostrils daily as needed for allergies. , Disp: , Rfl:  .  furosemide (LASIX) 20 MG tablet, Take 20 mg by mouth daily as needed for edema. , Disp: , Rfl:  .  ibuprofen (ADVIL,MOTRIN) 200 MG tablet, Take 400 mg by mouth every 6 (six) hours as needed for mild pain or moderate pain. , Disp: , Rfl:  .  loratadine (CLARITIN) 5 MG chewable tablet, Chew 5 mg by mouth daily as needed for allergies., Disp: , Rfl:  .  Respiratory Therapy Supplies (FLUTTER) DEVI, Use as directed., Disp: 1 each, Rfl: 0 .  sodium chloride HYPERTONIC 3 % nebulizer solution, Take by nebulization 2 (two) times daily. (Patient taking differently: Take 4 mLs by nebulization 2 (two) times daily. ), Disp: 750 mL, Rfl: 12 .  amoxicillin-clavulanate (AUGMENTIN) 875-125 MG tablet, Take 1 tablet by mouth 2 (two) times daily., Disp: 10 tablet, Rfl: 0 .  predniSONE (DELTASONE) 20 MG tablet, Take 1 tablet (20 mg total) by mouth daily with  breakfast., Disp: 3 tablet, Rfl: 0

## 2018-06-29 ENCOUNTER — Ambulatory Visit: Payer: Medicare Other | Attending: Orthopedic Surgery | Admitting: Physical Therapy

## 2018-06-29 ENCOUNTER — Telehealth: Payer: Self-pay | Admitting: Pulmonary Disease

## 2018-06-29 ENCOUNTER — Other Ambulatory Visit: Payer: Self-pay

## 2018-06-29 ENCOUNTER — Encounter: Payer: Self-pay | Admitting: Physical Therapy

## 2018-06-29 DIAGNOSIS — M6281 Muscle weakness (generalized): Secondary | ICD-10-CM | POA: Diagnosis not present

## 2018-06-29 DIAGNOSIS — M25611 Stiffness of right shoulder, not elsewhere classified: Secondary | ICD-10-CM | POA: Diagnosis not present

## 2018-06-29 DIAGNOSIS — M25511 Pain in right shoulder: Secondary | ICD-10-CM | POA: Diagnosis not present

## 2018-06-29 MED ORDER — DOXYCYCLINE HYCLATE 100 MG PO TABS: 100.0000 mg | ORAL_TABLET | Freq: Two times a day (BID) | ORAL | 0 refills | Status: DC

## 2018-06-29 MED ORDER — PREDNISONE 20 MG PO TABS: 20.0000 mg | ORAL_TABLET | Freq: Every day | ORAL | 0 refills | Status: DC

## 2018-06-29 NOTE — Telephone Encounter (Signed)
Primary Pulmonologist: McQuaid Last office visit and with whom: 06/22/2018, McQuaid What do we see them for (pulmonary problems: Bronchiectasis Last OV assessment/plan: 06/22/2018  Was appointment offered to patient (explain)?  Yes, pt refused because she was just seen by BQ  Reason for call: Spoke with pt, she finished the ABX but she is still having some coughing with thick yellow mucus. She feels like she needs another antibiotic  : When did symptoms start? 06/22/2018 symptoms got better but not completely cleared Fever? No Cough?  Productive? Some More sputum than usual? No, but some Thick clear/yellow mucus Wheezing? No Have you needed increased oxygen? Hasnt used more oxygen Are you taking your respiratory medications? What over the counter measures have you tried?) Cough drops  BQ please advise

## 2018-06-29 NOTE — Patient Instructions (Signed)
Access Code: [email or web address]  URL: [email or web address]  Date: 06/29/2018  Prepared by: Sherol Dade   Exercises  Towel Roll Squeeze - 15 reps - 3x daily - 7x weekly  Seated Shoulder Pendulum Exercise - 15 reps - 3x daily - 7x weekly  Seated Scapular Retraction - 15 reps - 3x daily - 7x weekly  Supine Shoulder Protraction with Dowel - 15 reps - 3x daily - 7x weekly  Supine Shoulder Flexion Extension AAROM with Dowel - 10-20 reps - 3x daily - 7x weekly  Seated Shoulder Circles - 15 reps - 3x daily - 7x weekly    [facility] [address] [place], [place] [address] Phone # [phone] Fax [phone]

## 2018-06-29 NOTE — Telephone Encounter (Signed)
Spoke with the pt and notified of recs per BQ  She verbalized understanding  Rxs were sent to pharm

## 2018-06-29 NOTE — Therapy (Signed)
[facility] [address] [place], [place], [address] Phone: [phone]   Fax:  [phone]  Physical Therapy Evaluation  Patient Details  Name: [name] MRN: [id number] Date of Birth: 12-29-1940 Referring Provider (PT): Jenetta Loges, [place]   Encounter Date: 06/29/2018  PT End of Session - 06/29/18 1023    Visit Number  1    Date for PT Re-Evaluation  09/07/18    Authorization Type  Medicare and BCBS supplement    Authorization Time Period  06/29/18 to 09/07/18    Authorization - Visit Number  1    Authorization - Number of Visits  10    PT Start Time  0944    PT Stop Time  1022    PT Time Calculation (min)  38 min    Activity Tolerance  No increased pain;Patient tolerated treatment well    Behavior During Therapy  [facility] for tasks assessed/performed       Past Medical History:  Diagnosis Date  . Allergic rhinitis    takes Claritin nightly  . Anxiety    but not on any meds  . Arthritis   . Asthma   . Carpal tunnel syndrome   . CHF (congestive heart failure) ([place])    patient denies-in office note 05/2015 by Aleene Davidson  . GERD (gastroesophageal reflux disease)   . History of bronchitis 3-56yr ago  . History of colon polyps    benign  . Joint pain   . Peripheral edema    takes Lasix daily  . Pneumonitis, hypersensitivity ([place] 05[place]   chronic hypersensitivity pneumonitis- Dr. MSpero Curb . Pulmonary hypertension ([place] 06/30/2017   patient denies-in office note from Dr. MSpero Curb  . Shortness of breath dyspnea    with exertion;Albuterol inhaler as needed  . Thyroid nodule     Past Surgical History:  Procedure Laterality Date  . BREAST CYST EXCISION Left 50 yrs ago  . CARPELL TUNNELL SURG Right   . COLONOSCOPY    . ESOPHAGOGASTRODUODENOSCOPY    . EYE SURGERY     bilateral cataract surgery with lens implants  . KNEE ARTHROSCOPY W/ MENISCAL REPAIR     left  . LUNG BIOPSY Right 11/20/2014   Procedure: LUNG BIOPSY;  Surgeon: SMelrose Nakayama MD;  [facility]  Service: Thoracic;  Laterality: Right;  . REVERSE SHOULDER ARTHROPLASTY Right 06/07/2018   Procedure: REVERSE SHOULDER ARTHROPLASTY;  Surgeon: SJustice Britain MD;  Location: [facility];  Service: Orthopedics;  Laterality: Right;  1253m  . TOTAL KNEE ARTHROPLASTY Left 08/13/2013   Procedure: LEFT TOTAL KNEE ARTHROPLASTY;  Surgeon: MaMauri PoleMD;  Location: [facility];  Service: Orthopedics;  Laterality: Left;  . UMBILICAL HERNIA REPAIR    . VIDEO ASSISTED THORACOSCOPY Right 11/20/2014   Procedure: RIGHT  VIDEO ASSISTED THORACOSCOPY WITH WEDGE LUNG BIOPSIES RIGHT UPPER,MIDDLE AND LOWER LOBES ,PLACEMENT OF ON Q PAIN PUMP;  Surgeon: StMelrose NakayamaMD;  [place] Service: Thoracic;  Laterality: Right;    There were no vitals filed for this visit.   Subjective Assessment - 06/29/18 0947    Subjective  Pt states that she underwent a shoulder replacement on 06/07/18. She denies pain immediately but has had increased soreness/discomfort this past week. She states her MD has released her to drive and she only has to wear her sling as she feels necessary.     Pertinent History  R TKA, CHF, plumonary HTN, 3L O2    Limitations  House hold activities    Currently in Pain?  No/denies   pt unsure what sensation she has in the Rt shoulder         [facility] PT Assessment - 06/29/18 0001      Assessment   Medical Diagnosis  Reverse Rt shoulder replacement     Referring Provider (PT)  Jenetta Loges, PA-C    Onset Date/Surgical Date  06/07/18    Hand Dominance  Right    Prior Therapy  none for shoulder       Precautions   Precautions  Shoulder    Type of Shoulder Precautions  reverse shoulder protocol       Restrictions   Weight Bearing Restrictions  No      Balance Screen   Has the patient fallen in the past 6 months  No    Has the patient had a decrease in activity level because of a fear of falling?   No    Is  the patient reluctant to leave their home because of a fear of falling?   No      Prior Function   Level of Independence  Independent      Cognition   Overall Cognitive Status  Within Functional Limits for tasks assessed      Observation/Other Assessments   Observations  Pt holding arm against her body, not wearing sling     Focus on Therapeutic Outcomes (FOTO)   53% limited       Sensation   Additional Comments  pt reports chronic numbness of the Lt and Rt fingers       Posture/Postural Control   Posture Comments  forward head, rounded shoulders       ROM / Strength   AROM / PROM / Strength  PROM;Strength      PROM   PROM Assessment Site  Shoulder    Right/Left Shoulder  Right   without resistance    Right Shoulder Extension  --   not assessed this visit    Right Shoulder Flexion  90 Degrees    Right Shoulder ABduction  70 Degrees    Right Shoulder External Rotation  30 Degrees      Strength   Overall Strength Comments  Rt grip 44.3 lb; Lt grip 40 lb; Rt shoulder strength deferred due to post-op limitations       Palpation   Palpation comment  minor muscle spasm noted Rt upper trap                Objective measurements completed on examination: See above findings.      [facility] Adult PT Treatment/Exercise - 06/29/18 0001      Exercises   Exercises  Shoulder      Shoulder Exercises: Supine   Protraction  Both;AAROM;10 reps    Protraction Limitations  at 90 deg flexion    Flexion  Right;AAROM;10 reps    Flexion Limitations  to 90 deg per post op protocol              PT Education - 06/29/18 1033    Education Details  precautions of ROM/shoulder use following shoulder; implemented and reviewed HEP    Person(s) Educated  Patient    Methods  Explanation;Handout;Verbal cues    Comprehension  Verbalized understanding;Returned demonstration       PT Short Term Goals - 06/29/18 1043      PT SHORT TERM GOAL #1   Title  Pt will demo consistency and  independence with her initial HEP to gradually improve shoulder ROM.    Time  6    Period  Weeks    Status  New    Target Date  08/10/18      PT SHORT TERM GOAL #2   Title  Pt will have atleast 90 deg active Rt shoulder flexion, 60 deg Rt shoulder abduction to allow for safe progression of strength.    Time  6    Period  Weeks    Status  New        PT Long Term Goals - 06/29/18 1050      PT LONG TERM GOAL #1   Title  Pt will have atleast 110 deg active Rt shoulder flexion, 90 deg Rt shoulder abduction to allow for safe progression of strength.    Time  10    Period  Weeks    Status  New    Target Date  09/07/18      PT LONG TERM GOAL #2   Title  Pt will have alteast 3+5 MMT strength of RUE to allow her to reach into her cabinet without difficulty.     Time  10    Period  Weeks    Status  New      PT LONG TERM GOAL #3   Title  Pt will report RUE functional use with no more than 2/10 pain throughout the day.    Time  10    Period  Weeks    Status  New      PT LONG TERM GOAL #4   Title  Pt's FOTO score will decrease to no greater than 35% limitation to reflect improvements in RUE use.     Time  10    Period  Weeks    Status  New      PT LONG TERM GOAL #5   Title  Pt will report atleast 60% improvement in Rt shoulder pain and use throughout the week.    Time  10    Period  Weeks    Status  New             Plan - 06/29/18 1034    Clinical Impression Statement  Pt is a 78 y.o F referred to OPPT s/p Rt reverse shoulder replacement on 06/07/18. She presents today without her sling noting that this was discharged by her physician. Pt denies any significant increase in Rt shoulder pain but does not some generalized soreness to the area. Her Rt shoulder passive ROM is up to 90 deg flexion and 70 deg abduction without resistance. In addition, her Rt grip strength is atleast 4 lb greater than the Lt despite the decrease in use of her RUE. Pt would benefit from therapist  education and instruction on safe and proper ROM, strength progressions. In addition, she would benefit from skilled PT intervention to promote Rt shoulder ROM, strength and endurance gains for overall increase in functional UE use and independence.     History and Personal Factors relevant to plan of care:  history of pulmonary issues, currently on 3L of O2    Clinical Presentation  Stable    Clinical Decision Making  Low    Rehab Potential  Good    PT Frequency  --   1-2x/week   PT Duration  --   10 weeks    PT Treatment/Interventions  ADLs/Self Care Home Management;Moist Heat;Cryotherapy;Therapeutic activities;Therapeutic exercise;Neuromuscular re-education;Patient/family education;Manual techniques;Passive range of motion;Dry needling;Taping  PT Next Visit Plan  f/u on HEP adherence; shoulder flexion/abd isometrics; AAROM/AROM in allowed ROM; scap strengthening    PT Home Exercise Plan  Access Code: [email or web address]     Consulted and Agree with Plan of Care  Patient       Patient will benefit from skilled therapeutic intervention in order to improve the following deficits and impairments:  Decreased endurance, Decreased mobility, Increased muscle spasms, Hypomobility, Postural dysfunction, Pain, Impaired UE functional use, Decreased strength, Decreased range of motion, Improper body mechanics  Visit Diagnosis: Stiffness of right shoulder, not elsewhere classified  Acute pain of right shoulder  Muscle weakness (generalized)     Problem List Patient Active Problem List   Diagnosis Date Noted  . 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 Chronic respiratory failure with hypoxia ([place]) 07/14/2017  . Diastolic CHF ([place]) 17/40/9927  . Pulmonary hypertension ([place]) 07/06/2016  . Physical deconditioning 07/06/2016  . Pulmonary fibrosis ([place]) 06/13/2016  . Acute on chronic respiratory failure with hypoxia ([place]) 06/13/2016  .  Hypersensitivity pneumonitis ([place]) 06/13/2016  . Rheumatoid arthritis ([place]) 06/12/2015  . Acute bronchitis 12/16/2014  . Interstitial fibrosis ([place]) 11/20/2014  . Bronchiectasis without acute exacerbation ([place]) 06/30/2014  . Recurrent acute sinusitis 06/17/2014  . Interstitial lung disease ([place]) 06/17/2014  . Obese 08/14/2013  . 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 Obesity 12/17/2010  . HTN (hypertension) 12/17/2010  . LOW BACK PAIN SYNDROME 07/09/2007  . DYSPNEA 07/09/2007  . HYPERLIPIDEMIA 04/03/2007  . GERD 04/03/2007    11:37 AM,06/29/18 Sherol Dade PT, DPT [facility] [address] [place], [place], [address] Phone: [phone]   Fax:  [phone]  Name: [name] MRN: [id number] Date of Birth: [birth year]

## 2018-06-29 NOTE — Telephone Encounter (Signed)
Prednisone 72m daily x 5 days Doxycycline 1083mpo bid x 7 days

## 2018-06-30 ENCOUNTER — Other Ambulatory Visit: Payer: Self-pay | Admitting: Pulmonary Disease

## 2018-06-30 ENCOUNTER — Telehealth: Payer: Self-pay | Admitting: Pulmonary Disease

## 2018-06-30 MED ORDER — AMOXICILLIN-POT CLAVULANATE 875-125 MG PO TABS: 1.0000 | ORAL_TABLET | Freq: Two times a day (BID) | ORAL | 0 refills | Status: AC

## 2018-06-30 NOTE — Telephone Encounter (Signed)
Patient did start doxycycline Developed a rash on her hand She is developed a rash with doxycycline in the past  We will discontinue doxycycline  We will re-prescribe Augmentin for 10 days-sent into pharmacy She will complete course of prednisone  Encouraged to call if any other significant concerns

## 2018-07-03 ENCOUNTER — Ambulatory Visit: Payer: Medicare Other | Admitting: Physical Therapy

## 2018-07-03 ENCOUNTER — Encounter: Payer: Self-pay | Admitting: Physical Therapy

## 2018-07-03 DIAGNOSIS — M6281 Muscle weakness (generalized): Secondary | ICD-10-CM

## 2018-07-03 DIAGNOSIS — M25611 Stiffness of right shoulder, not elsewhere classified: Secondary | ICD-10-CM

## 2018-07-03 DIAGNOSIS — M25511 Pain in right shoulder: Secondary | ICD-10-CM | POA: Diagnosis not present

## 2018-07-03 NOTE — Therapy (Signed)
[facility] [address] [place], [place], [address] Phone: [phone]   Fax:  [phone]  Physical Therapy Treatment  Patient Details  Name: [name] MRN: [id number] Date of Birth: 1940-11-02 Referring Provider (PT): Jenetta Loges, [place]   Encounter Date: 07/03/2018  PT End of Session - 07/03/18 1447    Visit Number  2    Date for PT Re-Evaluation  09/07/18    Authorization Type  Medicare and BCBS supplement    PT Start Time  5465    PT Stop Time  1525    PT Time Calculation (min)  38 min    Activity Tolerance  No increased pain;Patient tolerated treatment well    Behavior During Therapy  [facility] for tasks assessed/performed       Past Medical History:  Diagnosis Date  . Allergic rhinitis    takes Claritin nightly  . Anxiety    but not on any meds  . Arthritis   . Asthma   . Carpal tunnel syndrome   . CHF (congestive heart failure) ([place])    patient denies-in office note 05/2015 by Aleene Davidson  . GERD (gastroesophageal reflux disease)   . History of bronchitis 3-43yr ago  . History of colon polyps    benign  . Joint pain   . Peripheral edema    takes Lasix daily  . P[place]onitis, hypersensitivity ([place] 05[place]   chronic hypersensitivity pneumonitis- Dr. MSpero Curb . [place]monary [place] 06/30/2017   patient denies-in office note from Dr. MSpero Curb  . Shortness of breath dyspnea    with exertion;Albuterol inhaler as needed  . Thyroid nodule     Past Surgical History:  Procedure Laterality Date  . BREAST CYST EXCISION Left 50 yrs ago  . CARPELL TUNNELL SURG Right   . COLONOSCOPY    . ESOPHAGOGASTRODUODENOSCOPY    . EYE SURGERY     bilateral cataract surgery with lens implants  . KNEE ARTHROSCOPY W/ MENISCAL REPAIR     left  . LUNG BIOPSY Right 11/20/2014   Procedure: LUNG BIOPSY;  Surgeon: SMelrose Nakayama MD;  [facility]  Service: Thoracic;  Laterality: Right;  .  REVERSE SHOULDER ARTHROPLASTY Right 06/07/2018   Procedure: REVERSE SHOULDER ARTHROPLASTY;  Surgeon: SJustice Britain MD;  [facility];  Service: Orthopedics;  Laterality: Right;  1266m  . TOTAL KNEE ARTHROPLASTY Left 08/13/2013   Procedure: LEFT TOTAL KNEE ARTHROPLASTY;  Surgeon: MaMauri PoleMD;  [facility];  Service: Orthopedics;  Laterality: Left;  . UMBILICAL HERNIA REPAIR    . VIDEO ASSISTED THORACOSCOPY Right 11/20/2014   Procedure: RIGHT  VIDEO ASSISTED THORACOSCOPY WITH WEDGE LUNG BIOPSIES RIGHT UPPER,MIDDLE AND LOWER LOBES ,PLACEMENT OF ON Q PAIN PUMP;  Surgeon: StMelrose NakayamaMD;  [facility] Service: Thoracic;  Laterality: Right;    There were no vitals filed for this visit.  Subjective Assessment - 07/03/18 1457    Subjective  Pt states she is just having tightness.  She states it is hard to put her seatbelt on    Currently in Pain?  No/denies                       [facility] Treatment/Exercise - 07/03/18 0001      Shoulder Exercises: Supine   Flexion  Right;AAROM;10 reps    Flexion Limitations  to 90 deg per post op protocol     Other Supine Exercises  chest press with cane to 90 deg flexion      Shoulder Exercises: Isometric Strengthening   Flexion  5X5"    Extension  5X5"    External Rotation  5X5"    Internal Rotation  5X5"    ABduction  3X5"      Manual Therapy   Manual Therapy  Passive ROM    Passive ROM  flexion, abduction, ER               PT Short Term Goals - 06/29/18 1043      PT SHORT TERM GOAL #1   Title  Pt will demo consistency and independence with her initial HEP to gradually improve shoulder ROM.    Time  6    Period  Weeks    Status  New    Target Date  08/10/18      PT SHORT TERM GOAL #2   Title  Pt will have atleast 90 deg active Rt shoulder flexion, 60 deg Rt shoulder abduction to allow for safe progression of strength.    Time  6    Period  Weeks    Status  New        PT Long  Term Goals - 06/29/18 1050      PT LONG TERM GOAL #1   Title  Pt will have atleast 110 deg active Rt shoulder flexion, 90 deg Rt shoulder abduction to allow for safe progression of strength.    Time  10    Period  Weeks    Status  New    Target Date  09/07/18      PT LONG TERM GOAL #2   Title  Pt will have alteast 3+5 MMT strength of RUE to allow her to reach into her cabinet without difficulty.     Time  10    Period  Weeks    Status  New      PT LONG TERM GOAL #3   Title  Pt will report RUE functional use with no more than 2/10 pain throughout the day.    Time  10    Period  Weeks    Status  New      PT LONG TERM GOAL #4   Title  Pt's FOTO score will decrease to no greater than 35% limitation to reflect improvements in RUE use.     Time  10    Period  Weeks    Status  New      PT LONG TERM GOAL #5   Title  Pt will report atleast 60% improvement in Rt shoulder pain and use throughout the week.    Time  10    Period  Weeks    Status  New            Plan - 07/03/18 1725    Clinical Impression Statement  Pt is doing well with initial HEP.  She was progressed to isometric strengthening and added to HEP today.  Pt continues to need skilled PT for safe progression of ROM and strength s/p reverse total shoulder replacement.    PT Treatment/Interventions  ADLs/Self Care Home Management;Moist Heat;Cryotherapy;Therapeutic activities;Therapeutic exercise;Neuromuscular re-education;Patient/family education;Manual techniques;Passive range of motion;Dry needling;Taping    PT Next Visit Plan  f/u on HEP; shoulder AAROM/AROM in allowed ROM; scap strengthening    PT Home Exercise Plan  Access Code: [email or web address]     Consulted and Agree with Plan of Care  Patient  Patient will benefit from skilled therapeutic intervention in order to improve the following deficits and impairments:  Decreased endurance, Decreased mobility, Increased muscle spasms, Hypomobility, Postural dysfunction,  Pain, Impaired UE functional use, Decreased strength, Decreased range of motion, Improper body mechanics  Visit Diagnosis: Stiffness of right shoulder, not elsewhere classified  Acute pain of right shoulder  Muscle weakness (generalized)     Problem List Patient Active Problem List   Diagnosis Date Noted  . 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 Chronic respiratory failure with hypoxia ([place]) 07/14/2017  . Diastolic CHF ([place]) 92/95/7473  . Pulmonary hypertension ([place]) 07/06/2016  . Physical deconditioning 07/06/2016  . Pulmonary fibrosis ([place]) 06/13/2016  . Acute on chronic respiratory failure with hypoxia ([place]) 06/13/2016  . Hypersensitivity pneumonitis ([place]) 06/13/2016  . Rheumatoid arthritis ([place]) 06/12/2015  . Acute bronchitis 12/16/2014  . 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 Bronchiectasis without acute exacerbation ([place]) 06/30/2014  . Recurrent acute sinusitis 06/17/2014  . Interstitial lung disease ([place]) 06/17/2014  . Obese 08/14/2013  . 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 Obesity 12/17/2010  . HTN (hypertension) 12/17/2010  . LOW BACK PAIN SYNDROME 07/09/2007  . DYSPNEA 07/09/2007  . HYPERLIPIDEMIA 04/03/2007  . GERD 04/03/2007    Zannie Cove, PT 07/03/2018, 5:29 PM  [facility] [address] [place], [place], [address] Phone: [phone]   Fax:  [phone]  Name: DISAYA WALT MRN: [id number] Date of Birth: 28-Nov-1940

## 2018-07-10 ENCOUNTER — Ambulatory Visit: Payer: Medicare Other | Attending: Orthopedic Surgery

## 2018-07-10 DIAGNOSIS — M6281 Muscle weakness (generalized): Secondary | ICD-10-CM | POA: Diagnosis not present

## 2018-07-10 DIAGNOSIS — M25611 Stiffness of right shoulder, not elsewhere classified: Secondary | ICD-10-CM | POA: Diagnosis not present

## 2018-07-10 DIAGNOSIS — M25511 Pain in right shoulder: Secondary | ICD-10-CM | POA: Insufficient documentation

## 2018-07-10 NOTE — Therapy (Signed)
[facility] [address] [place], [place], [address] Phone: [phone]   Fax:  [phone]  Physical Therapy Treatment  Patient Details  Name: [name] MRN: [id number] Date of Birth: Mar 13, 1941 Referring Provider (PT): Jenetta Loges, [place]   Encounter Date: 07/10/2018  PT End of Session - 07/10/18 1225    Visit Number  3    Date for PT Re-Evaluation  09/07/18    Authorization Type  Medicare and BCBS supplement    PT Start Time  9485    PT Stop Time  1226    PT Time Calculation (min)  39 min    Activity Tolerance  No increased pain;Patient tolerated treatment well    Behavior During Therapy  [facility] for tasks assessed/performed       Past Medical History:  Diagnosis Date  . Allergic rhinitis    takes Claritin nightly  . Anxiety    but not on any meds  . Arthritis   . Asthma   . Carpal tunnel syndrome   . CHF (congestive heart failure) ([place])    patient denies-in office note 05/2015 by Aleene Davidson  . GERD (gastroesophageal reflux disease)   . History of bronchitis 3-18yr ago  . History of colon polyps    benign  . Joint pain   . Peripheral edema    takes Lasix daily  . Pneumonitis, hypersensitivity ([place] 05[place]   chronic hypersensitivity pneumonitis- Dr. MSpero Curb . [place]monary [place] ([place] 06/30/2017   patient denies-in office note from Dr. MSpero Curb  . Shortness of breath dyspnea    with exertion;Albuterol inhaler as needed  . Thyroid nodule     Past Surgical History:  Procedure Laterality Date  . BREAST CYST EXCISION Left 50 yrs ago  . CARPELL TUNNELL SURG Right   . COLONOSCOPY    . ESOPHAGOGASTRODUODENOSCOPY    . EYE SURGERY     bilateral cataract surgery with lens implants  . KNEE ARTHROSCOPY W/ MENISCAL REPAIR     left  . LUNG BIOPSY Right 11/20/2014   Procedure: LUNG BIOPSY;  Surgeon: SMelrose Nakayama MD;  [facility]  Service: Thoracic;  Laterality: Right;  .  REVERSE SHOULDER ARTHROPLASTY Right 06/07/2018   Procedure: REVERSE SHOULDER ARTHROPLASTY;  Surgeon: SJustice Britain MD;  [facility];  Service: Orthopedics;  Laterality: Right;  1273m  . TOTAL KNEE ARTHROPLASTY Left 08/13/2013   Procedure: LEFT TOTAL KNEE ARTHROPLASTY;  Surgeon: MaMauri PoleMD;  [facility];  Service: Orthopedics;  Laterality: Left;  . UMBILICAL HERNIA REPAIR    . VIDEO ASSISTED THORACOSCOPY Right 11/20/2014   Procedure: RIGHT  VIDEO ASSISTED THORACOSCOPY WITH WEDGE LUNG BIOPSIES RIGHT UPPER,MIDDLE AND LOWER LOBES ,PLACEMENT OF ON Q PAIN PUMP;  Surgeon: StMelrose NakayamaMD;  [facility] Service: Thoracic;  Laterality: Right;    There were no vitals filed for this visit.  Subjective Assessment - 07/10/18 1152    Subjective  I'm doing good.  No pain.      Pertinent History  R TKA, CHF, plumonary HTN, 3L O2    Currently in Pain?  No/denies                       [facility] - 07/10/18 0001      Shoulder Exercises: Supine   Flexion  Right;AAROM;10 reps    Flexion Limitations  clasped hands and with dowel       Shoulder  Exercises: Pulleys   Flexion  3 minutes   stop at 115 degrees     Shoulder Exercises: Isometric Strengthening   Flexion  5X5"   10 reps   Extension  5X5"   10 reps   Internal Rotation  5X5"   10 reps   ABduction  5X5"      Manual Therapy   Manual Therapy  Passive ROM    Passive ROM  flexion, abduction, ER within protocol limits               PT Short Term Goals - 06/29/18 1043      PT SHORT TERM GOAL #1   Title  Pt will demo consistency and independence with her initial HEP to gradually improve shoulder ROM.    Time  6    Period  Weeks    Status  New    Target Date  08/10/18      PT SHORT TERM GOAL #2   Title  Pt will have atleast 90 deg active Rt shoulder flexion, 60 deg Rt shoulder abduction to allow for safe progression of strength.    Time  6    Period  Weeks     Status  New        PT Long Term Goals - 06/29/18 1050      PT LONG TERM GOAL #1   Title  Pt will have atleast 110 deg active Rt shoulder flexion, 90 deg Rt shoulder abduction to allow for safe progression of strength.    Time  10    Period  Weeks    Status  New    Target Date  09/07/18      PT LONG TERM GOAL #2   Title  Pt will have alteast 3+5 MMT strength of RUE to allow her to reach into her cabinet without difficulty.     Time  10    Period  Weeks    Status  New      PT LONG TERM GOAL #3   Title  Pt will report RUE functional use with no more than 2/10 pain throughout the day.    Time  10    Period  Weeks    Status  New      PT LONG TERM GOAL #4   Title  Pt's FOTO score will decrease to no greater than 35% limitation to reflect improvements in RUE use.     Time  10    Period  Weeks    Status  New      PT LONG TERM GOAL #5   Title  Pt will report atleast 60% improvement in Rt shoulder pain and use throughout the week.    Time  10    Period  Weeks    Status  New            Plan - 07/10/18 1156    Clinical Impression Statement  Pt continues to report no Rt shoulder pain.  Pt has not been complaint in HEP and PT provided review and cueing for technique today.  Pt requires tactile cues to reduce scapular elevation.  Pt tolerated all exercise in the clinic for strength and flexibility progress.  Pt will continue to benefit from skilled PT for Rt shoulder flexibility, strength and endurance per protocol.      PT Frequency  2x / week    PT Duration  --   10 weeks   PT Treatment/Interventions  ADLs/Self Care Home Management;Moist  Heat;Cryotherapy;Therapeutic activities;Therapeutic exercise;Neuromuscular re-education;Patient/family education;Manual techniques;Passive range of motion;Dry needling;Taping    PT Next Visit Plan   shoulder AAROM/AROM in allowed ROM; scap strengthening.  follow protocol      PT Home Exercise Plan  Access Code: [email or web address]     Consulted and Agree  with Plan of Care  Patient       Patient will benefit from skilled therapeutic intervention in order to improve the following deficits and impairments:  Decreased endurance, Decreased mobility, Increased muscle spasms, Hypomobility, Postural dysfunction, Pain, Impaired UE functional use, Decreased strength, Decreased range of motion, Improper body mechanics  Visit Diagnosis: Stiffness of right shoulder, not elsewhere classified  Acute pain of right shoulder  Muscle weakness (generalized)     Problem List Patient Active Problem List   Diagnosis Date Noted  . 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 Chronic respiratory failure with hypoxia ([place]) 07/14/2017  . Diastolic CHF ([place]) 50/56/9794  . Pulmonary hypertension ([place]) 07/06/2016  . Physical deconditioning 07/06/2016  . Pulmonary fibrosis ([place]) 06/13/2016  . Acute on chronic respiratory failure with hypoxia ([place]) 06/13/2016  . Hypersensitivity pneumonitis ([place]) 06/13/2016  . Rheumatoid arthritis ([place]) 06/12/2015  . Acute bronchitis 12/16/2014  . Interstitial fibrosis ([place]) 11/20/2014  . Bronchiectasis without acute exacerbation ([place]) 06/30/2014  . Recurrent acute sinusitis 06/17/2014  . Interstitial lung disease ([place]) 06/17/2014  . Obese 08/14/2013  . 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 Obesity 12/17/2010  . HTN (hypertension) 12/17/2010  . LOW BACK PAIN SYNDROME 07/09/2007  . DYSPNEA 07/09/2007  . HYPERLIPIDEMIA 04/03/2007  . GERD 04/03/2007    Sigurd Sos, PT 07/10/18 12:27 PM  [facility] [address] [place], [place], [address] Phone: [phone]   Fax:  [phone]  Name: [name] MRN: [id number] Date of Birth: [birth year]

## 2018-07-12 ENCOUNTER — Ambulatory Visit: Payer: Medicare Other

## 2018-07-24 ENCOUNTER — Encounter: Payer: Self-pay | Admitting: Physical Therapy

## 2018-07-24 ENCOUNTER — Ambulatory Visit: Payer: Medicare Other | Admitting: Physical Therapy

## 2018-07-24 DIAGNOSIS — M25511 Pain in right shoulder: Secondary | ICD-10-CM | POA: Diagnosis not present

## 2018-07-24 DIAGNOSIS — M25611 Stiffness of right shoulder, not elsewhere classified: Secondary | ICD-10-CM | POA: Diagnosis not present

## 2018-07-24 DIAGNOSIS — M6281 Muscle weakness (generalized): Secondary | ICD-10-CM | POA: Diagnosis not present

## 2018-07-24 NOTE — Patient Instructions (Signed)
Access Code: [email or web address]  URL: [email or web address]  Date: 07/24/2018  Prepared by: Sherol Dade   Exercises  Towel Roll Squeeze - 15 reps - 3x daily - 7x weekly  Seated Shoulder Circles - 15 reps - 3x daily - 7x weekly  Standing Isometric Shoulder External Rotation with Doorway - 10 reps - 3 sets - 1x daily - 7x weekly  Standing Isometric Shoulder Internal Rotation with Towel Roll at Doorway - 10 reps - 3 sets - 1x daily - 7x weekly  Standing Isometric Shoulder Flexion with Doorway - Arm Bent - 10 reps - 3 sets - 1x daily - 7x weekly  Standing Isometric Shoulder Abduction with Doorway - Arm Bent - 10 reps - 3 sets - 1x daily - 7x weekly  Standing Isometric Shoulder Extension with Doorway - Arm Bent - 10 reps - 3 sets - 1x daily - 7x weekly  Seated Shoulder Abduction AAROM with Dowel - 10 reps - 2 sets - 1x daily - 7x weekly  Sidelying Shoulder External Rotation - 10 reps - 2 sets - 1x daily - 7x weekly  Seated Gentle Upper Trapezius Stretch - 3 reps - 1 sets - 30 sec hold - 1x daily - 7x weekly  Seated Shoulder Flexion AAROM with Dowel - 10 reps - 3 sets - 1x daily - 7x weekly    [facility] [address] [place], [place] [address] Phone # [phone] Fax [phone]

## 2018-07-24 NOTE — Therapy (Signed)
[facility] [address] [place], [place], [address] Phone: [phone]   Fax:  [phone]  Physical Therapy Treatment  Patient Details  Name: [name] MRN: [id number] Date of Birth: 1941/02/09 Referring Provider (PT): Jenetta Loges, [place]   Encounter Date: 07/24/2018  PT End of Session - 07/24/18 1209    Visit Number  4    Date for PT Re-Evaluation  09/07/18    Authorization Type  Medicare and BCBS supplement    PT Start Time  1151    PT Stop Time  1230    PT Time Calculation (min)  39 min    Activity Tolerance  No increased pain;Patient tolerated treatment well    Behavior During Therapy  [facility] for tasks assessed/performed       Past Medical History:  Diagnosis Date  . Allergic rhinitis    takes Claritin nightly  . Anxiety    but not on any meds  . Arthritis   . Asthma   . Carpal tunnel syndrome   . CHF (congestive heart failure) ([place])    patient denies-in office note 05/2015 by Aleene Davidson  . GERD (gastroesophageal reflux disease)   . History of bronchitis 3-19yr ago  . History of colon polyps    benign  . Joint pain   . Peripheral edema    takes Lasix daily  . Pneumonitis, hypersensitivity ([place] 05/2016   chronic hypersensitivity pneumonitis- Dr. MSpero Curb . Pulmonary hypertension ([place] 06/30/2017   patient denies-in office note from Dr. MSpero Curb  . Shortness of breath dyspnea    with exertion;Albuterol inhaler as needed  . Thyroid nodule     Past Surgical History:  Procedure Laterality Date  . BREAST CYST EXCISION Left 50 yrs ago  . CARPELL TUNNELL SURG Right   . COLONOSCOPY    . ESOPHAGOGASTRODUODENOSCOPY    . EYE SURGERY     bilateral cataract surgery with lens implants  . KNEE ARTHROSCOPY W/ MENISCAL REPAIR     left  . LUNG BIOPSY Right 11/20/2014   Procedure: LUNG BIOPSY;  Surgeon: SMelrose Nakayama MD;  [facility]  Service: Thoracic;  Laterality: Right;  .  REVERSE SHOULDER ARTHROPLASTY Right 06/07/2018   Procedure: REVERSE SHOULDER ARTHROPLASTY;  Surgeon: SJustice Britain MD;  [facility];  Service: Orthopedics;  Laterality: Right;  1232m  . TOTAL KNEE ARTHROPLASTY Left 08/13/2013   Procedure: LEFT TOTAL KNEE ARTHROPLASTY;  Surgeon: MaMauri PoleMD;  [facility];  Service: Orthopedics;  Laterality: Left;  . UMBILICAL HERNIA REPAIR    . VIDEO ASSISTED THORACOSCOPY Right 11/20/2014   Procedure: RIGHT  VIDEO ASSISTED THORACOSCOPY WITH WEDGE LUNG BIOPSIES RIGHT UPPER,MIDDLE AND LOWER LOBES ,PLACEMENT OF ON Q PAIN PUMP;  Surgeon: StMelrose NakayamaMD;  [facility]: [facility] Service: Thoracic;  Laterality: Right;    There were no vitals filed for this visit.  Subjective Assessment - 07/24/18 1152    Subjective  Pt states things are going well. She is noticing she can use her arm more.    Pertinent History  R TKA, CHF, plumonary HTN, 3L O2    Currently in Pain?  No/denies         [facility]      ROM / Strength   AROM / PROM / Strength  AROM      AROM   Overall AROM Comments  shoulder flexion Rt 90 deg      Strength  Overall Strength Comments  Lt: 42lb, Rt 47lb                   [facility] Adult PT Treatment/Exercise - 07/24/18 0001      Shoulder Exercises: Seated   Flexion  AAROM;Right;15 reps    Abduction  AAROM;Right;10 reps    ABduction Limitations  x2 sets       Shoulder Exercises: Sidelying   External Rotation  AROM;15 reps    External Rotation Limitations  x2 sets     Flexion  AROM;Strengthening;Right;10 reps    Flexion Limitations  x2 sets to 115 deg     ABduction  AROM;Right;15 reps    ABduction Limitations  90 deg       Shoulder Exercises: Therapy Ball   Other Therapy Ball Exercises  RUE press into ball 15x5 sec       Shoulder Exercises: ROM/Strengthening   Other ROM/Strengthening Exercises  Rt bicep curls with 2# dumbbells 2x15 reps     Other ROM/Strengthening Exercises  Rt  finger ladder x10 reps L21             PT Education - 07/24/18 1209    Education Details  ROM precautions with updates to HEP    Person(s) Educated  Patient    Methods  Explanation;Handout;Demonstration    Comprehension  Verbalized understanding;Returned demonstration       PT Short Term Goals - 07/24/18 1213      PT SHORT TERM GOAL #1   Title  Pt will demo consistency and independence with her initial HEP to gradually improve shoulder ROM.    Time  6    Period  Weeks    Status  Achieved    Target Date  08/10/18      PT SHORT TERM GOAL #2   Title  Pt will have atleast 90 deg active Rt shoulder flexion, 60 deg Rt shoulder abduction to allow for safe progression of strength.    Time  6    Period  Weeks    Status  Achieved        PT Long Term Goals - 07/24/18 1214      PT LONG TERM GOAL #1   Title  Pt will have atleast 110 deg active Rt shoulder flexion, 90 deg Rt shoulder abduction to allow for safe progression of strength.    Time  10    Period  Weeks    Status  New      PT LONG TERM GOAL #2   Title  Pt will have alteast 3+5 MMT strength of RUE to allow her to reach into her cabinet without difficulty.     Time  10    Period  Weeks    Status  New      PT LONG TERM GOAL #3   Title  Pt will report RUE functional use with no more than 2/10 pain throughout the day.    Time  10    Period  Weeks    Status  New      PT LONG TERM GOAL #4   Title  Pt's FOTO score will decrease to no greater than 35% limitation to reflect improvements in RUE use.     Time  10    Period  Weeks    Status  New      PT LONG TERM GOAL #5   Title  Pt will report atleast 60% improvement in Rt shoulder pain and use throughout the week.  Time  10    Period  Weeks    Status  New            Plan - 07/24/18 1309    Clinical Impression Statement  Pt is making progress, having met all short term goals at this point in her POC. She is able to actively flex her Rt shoulder to 90 deg  and reports being able to use her RUE to assist with fixing her hair in the mornings. Session focused on promoting active and active assisted shoulder ROM within pain free ranges. Pt did note muscle fatigue during this, but has no issues with pain during or following the exercises. Her HEP was updated to reflect progress and she had full understanding of this. She would continue to benefit from skilled PT to promote shoulder ROM, strength and increase functional use.     PT Frequency  2x / week    PT Duration  --   10 weeks   PT Treatment/Interventions  ADLs/Self Care Home Management;Moist Heat;Cryotherapy;Therapeutic activities;Therapeutic exercise;Neuromuscular re-education;Patient/family education;Manual techniques;Passive range of motion;Dry needling;Taping    PT Next Visit Plan   shoulder AAROM/AROM in allowed ROM; scap strengthening.  follow protocol      PT Home Exercise Plan  Access Code: [email or web address]     Consulted and Agree with Plan of Care  Patient       Patient will benefit from skilled therapeutic intervention in order to improve the following deficits and impairments:  Decreased endurance, Decreased mobility, Increased muscle spasms, Hypomobility, Postural dysfunction, Pain, Impaired UE functional use, Decreased strength, Decreased range of motion, Improper body mechanics  Visit Diagnosis: Stiffness of right shoulder, not elsewhere classified  Acute pain of right shoulder  Muscle weakness (generalized)     Problem List Patient Active Problem List   Diagnosis Date Noted  . 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 Chronic respiratory failure with hypoxia ([place]) 07/14/2017  . Diastolic CHF ([place]) 78/67/5449  . Pulmonary hypertension ([place]) 07/06/2016  . Physical deconditioning 07/06/2016  . Pulmonary fibrosis ([place]) 06/13/2016  . Acute on chronic respiratory failure with hypoxia ([place]) 06/13/2016  . Hypersensitivity  pneumonitis ([place]) 06/13/2016  . Rheumatoid arthritis ([place]) 06/12/2015  . Acute bronchitis 12/16/2014  . 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 Bronchiectasis without acute exacerbation ([place]) 06/30/2014  . Recurrent acute sinusitis 06/17/2014  . Interstitial lung disease ([place]) 06/17/2014  . Obese 08/14/2013  . 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 Obesity 12/17/2010  . HTN (hypertension) 12/17/2010  . LOW BACK PAIN SYNDROME 07/09/2007  . DYSPNEA 07/09/2007  . HYPERLIPIDEMIA 04/03/2007  . GERD 04/03/2007    1:12 PM,07/24/18 Sherol Dade PT, DPT [place] [facility] [address], [place], [place], [address] Phone: [phone]   Fax:  [phone]  Name: [name] MRN: [id number] Date of Birth: 07-Dec-1940

## 2018-07-26 ENCOUNTER — Ambulatory Visit: Payer: Medicare Other | Admitting: Physical Therapy

## 2018-07-26 ENCOUNTER — Encounter: Payer: Self-pay | Admitting: Physical Therapy

## 2018-07-26 DIAGNOSIS — M25511 Pain in right shoulder: Secondary | ICD-10-CM | POA: Diagnosis not present

## 2018-07-26 DIAGNOSIS — M6281 Muscle weakness (generalized): Secondary | ICD-10-CM

## 2018-07-26 DIAGNOSIS — M25611 Stiffness of right shoulder, not elsewhere classified: Secondary | ICD-10-CM | POA: Diagnosis not present

## 2018-07-26 NOTE — Therapy (Signed)
[facility] [address], [place], [place], [address] Phone: [phone]   Fax:  [phone]  Physical Therapy Treatment  Patient Details  Name: [name] MRN: [id number] Date of Birth: December 05, 1940 Referring Provider (PT): Jenetta Loges, [place]   Encounter Date: 07/26/2018  PT End of Session - 07/26/18 1155    Visit Number  5    Date for PT Re-Evaluation  09/07/18    Authorization Type  Medicare and BCBS supplement    PT Start Time  1792   pt arrived late    PT Stop Time  1227    PT Time Calculation (min)  34 min    Activity Tolerance  No increased pain;Patient tolerated treatment well    Behavior During Therapy  [facility] for tasks assessed/performed       Past Medical History:  Diagnosis Date  . Allergic rhinitis    takes Claritin nightly  . Anxiety    but not on any meds  . Arthritis   . Asthma   . Carpal tunnel syndrome   . CHF (congestive heart failure) ([place])    patient denies-in office note 05/2015 by Aleene Davidson  . GERD (gastroesophageal reflux disease)   . History of bronchitis 3-19yr ago  . History of colon polyps    benign  . Joint pain   . Peripheral edema    takes Lasix daily  . Pneumonitis, hypersensitivity ([place] 05/2016   chronic hypersensitivity pneumonitis- Dr. MSpero Curb . Pulmonary hypertension ([place] 06/30/2017   patient denies-in office note from Dr. MSpero Curb  . Shortness of breath dyspnea    with exertion;Albuterol inhaler as needed  . Thyroid nodule     Past Surgical History:  Procedure Laterality Date  . BREAST CYST EXCISION Left 50 yrs ago  . CARPELL TUNNELL SURG Right   . COLONOSCOPY    . ESOPHAGOGASTRODUODENOSCOPY    . EYE SURGERY     bilateral cataract surgery with lens implants  . KNEE ARTHROSCOPY W/ MENISCAL REPAIR     left  . LUNG BIOPSY Right 11/20/2014   Procedure: LUNG BIOPSY;  Surgeon: SMelrose Nakayama MD;  [facility]  Service: Thoracic;   Laterality: Right;  . REVERSE SHOULDER ARTHROPLASTY Right 06/07/2018   Procedure: REVERSE SHOULDER ARTHROPLASTY;  Surgeon: SJustice Britain MD;  [facility];  Service: Orthopedics;  Laterality: Right;  1270m  . TOTAL KNEE ARTHROPLASTY Left 08/13/2013   Procedure: LEFT TOTAL KNEE ARTHROPLASTY;  Surgeon: MaMauri PoleMD;  [facility];  Service: Orthopedics;  Laterality: Left;  . UMBILICAL HERNIA REPAIR    . VIDEO ASSISTED THORACOSCOPY Right 11/20/2014   Procedure: RIGHT  VIDEO ASSISTED THORACOSCOPY WITH WEDGE LUNG BIOPSIES RIGHT UPPER,MIDDLE AND LOWER LOBES ,PLACEMENT OF ON Q PAIN PUMP;  Surgeon: StMelrose NakayamaMD;  [place] Service: Thoracic;  Laterality: Right;    There were no vitals filed for this visit.  Subjective Assessment - 07/26/18 1153    Subjective  Pt states that she was a little sore after her last session, but she was fine yesterday. No pain currently.     Pertinent History  R TKA, CHF, plumonary HTN, 3L O2    Currently in Pain?  No/denies                       [facility] - 07/26/18 0001      Exercises   Exercises  Other Exercises  Other Exercises   Rt wrist flexion/extension/radial deviation with 3# x20 reps       Shoulder Exercises: Supine   Flexion  Strengthening;Both;10 reps    Shoulder Flexion Weight (lbs)  2# on SPC    Flexion Limitations  x2 sets     Other Supine Exercises  reclined: BUE press with 2# on SPC 2x10 reps; serratus press at 90 deg 2# on [facility]      Shoulder Exercises: ROM/Strengthening   [place]  RUE L30: flexion 2x10 reps; seated protraction x20 reps; seated external rotation x15 reps     Other ROM/Strengthening Exercises  B bicep curls x10 reps full range and partial range x10 reps each              PT Education - 07/26/18 1215    Education Details  technique with therex    Person(s) Educated  Patient    Methods  Explanation;Verbal cues    Comprehension  Verbalized  understanding;Returned demonstration       PT Short Term Goals - 07/24/18 1213      PT SHORT TERM GOAL #1   Title  Pt will demo consistency and independence with her initial HEP to gradually improve shoulder ROM.    Time  6    Period  Weeks    Status  Achieved    Target Date  08/10/18      PT SHORT TERM GOAL #2   Title  Pt will have atleast 90 deg active Rt shoulder flexion, 60 deg Rt shoulder abduction to allow for safe progression of strength.    Time  6    Period  Weeks    Status  Achieved        PT Long Term Goals - 07/24/18 1214      PT LONG TERM GOAL #1   Title  Pt will have atleast 110 deg active Rt shoulder flexion, 90 deg Rt shoulder abduction to allow for safe progression of strength.    Time  10    Period  Weeks    Status  New      PT LONG TERM GOAL #2   Title  Pt will have alteast 3+5 MMT strength of RUE to allow her to reach into her cabinet without difficulty.     Time  10    Period  Weeks    Status  New      PT LONG TERM GOAL #3   Title  Pt will report RUE functional use with no more than 2/10 pain throughout the day.    Time  10    Period  Weeks    Status  New      PT LONG TERM GOAL #4   Title  Pt's FOTO score will decrease to no greater than 35% limitation to reflect improvements in RUE use.     Time  10    Period  Weeks    Status  New      PT LONG TERM GOAL #5   Title  Pt will report atleast 60% improvement in Rt shoulder pain and use throughout the week.    Time  10    Period  Weeks    Status  New            Plan - 07/26/18 1625    Clinical Impression Statement  Pt continues to do well during her sessions. She is completing her HEP more consistently now that she is back in town. Continued focus on  promoting shoulder ROM and strength as allow by post-op protocol. Pt reported muscle fatigue with addition of light weight, but no increase in shoulder pain was noted end of today's session. Will continue with current POC.    PT Frequency  2x  / week    PT Duration  --   10 weeks   PT Treatment/Interventions  ADLs/Self Care Home Management;Moist Heat;Cryotherapy;Therapeutic activities;Therapeutic exercise;Neuromuscular re-education;Patient/family education;Manual techniques;Passive range of motion;Dry needling;Taping    PT Next Visit Plan   shoulder AAROM/AROM in allowed ROM; scap strengthening.  follow protocol      PT Home Exercise Plan  Access Code: [email or web address]     Consulted and Agree with Plan of Care  Patient       Patient will benefit from skilled therapeutic intervention in order to improve the following deficits and impairments:  Decreased endurance, Decreased mobility, Increased muscle spasms, Hypomobility, Postural dysfunction, Pain, Impaired UE functional use, Decreased strength, Decreased range of motion, Improper body mechanics  Visit Diagnosis: Stiffness of right shoulder, not elsewhere classified  Acute pain of right shoulder  Muscle weakness (generalized)     Problem List Patient Active Problem List   Diagnosis Date Noted  . 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 Chronic respiratory failure with hypoxia ([place]) 07/14/2017  . Diastolic CHF ([place]) 04/47/1580  . Pulmonary hypertension ([place]) 07/06/2016  . Physical deconditioning 07/06/2016  . Pulmonary fibrosis ([place]) 06/13/2016  . Acute on chronic respiratory failure with hypoxia ([place]) 06/13/2016  . Hypersensitivity pneumonitis ([place]) 06/13/2016  . Rheumatoid arthritis ([place]) 06/12/2015  . Acute bronchitis 12/16/2014  . Interstitial fibrosis ([place]) 11/20/2014  . Bronchiectasis without acute exacerbation ([place]) 06/30/2014  . Recurrent acute sinusitis 06/17/2014  . Interstitial lung disease ([place]) 06/17/2014  . Obese 08/14/2013  . 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 Obesity 12/17/2010  . HTN (hypertension) 12/17/2010  . LOW BACK PAIN SYNDROME 07/09/2007  . DYSPNEA 07/09/2007  . HYPERLIPIDEMIA 04/03/2007  .  GERD 04/03/2007    4:28 PM,07/26/18 Sherol Dade PT, DPT [place] [facility] [address] [place], [place], [address] Phone: [phone]   Fax:  [phone]  Name: KRISTALYNN CODDINGTON MRN: [id number] Date of Birth: 12-15-1940

## 2018-07-31 ENCOUNTER — Ambulatory Visit: Payer: Medicare Other

## 2018-07-31 DIAGNOSIS — M25511 Pain in right shoulder: Secondary | ICD-10-CM | POA: Diagnosis not present

## 2018-07-31 DIAGNOSIS — M6281 Muscle weakness (generalized): Secondary | ICD-10-CM | POA: Diagnosis not present

## 2018-07-31 DIAGNOSIS — M25611 Stiffness of right shoulder, not elsewhere classified: Secondary | ICD-10-CM | POA: Diagnosis not present

## 2018-07-31 NOTE — Therapy (Signed)
[facility] [address], [place], [place], [address] Phone: [phone]   Fax:  [phone]  Physical Therapy Treatment  Patient Details  Name: [name] MRN: [id number] Date of Birth: 1940/08/04 Referring Provider (PT): Jenetta Loges, [place]   Encounter Date: 07/31/2018  PT End of Session - 07/31/18 1225    Visit Number  6    Date for PT Re-Evaluation  09/07/18    Authorization Type  Medicare and BCBS supplement    PT Start Time  0174    PT Stop Time  1225    PT Time Calculation (min)  38 min    Activity Tolerance  No increased pain;Patient tolerated treatment well    Behavior During Therapy  [facility] for tasks assessed/performed       Past Medical History:  Diagnosis Date  . Allergic rhinitis    takes Claritin nightly  . Anxiety    but not on any meds  . Arthritis   . Asthma   . Carpal tunnel syndrome   . CHF (congestive heart failure) ([place])    patient denies-in office note 05/2015 by Aleene Davidson  . GERD (gastroesophageal reflux disease)   . History of bronchitis 3-67yr ago  . History of colon polyps    benign  . Joint pain   . Peripheral edema    takes Lasix daily  . P[place]onitis[place] hypersensitivity ([place] 05/[place]   chronic hypersensitivity pneumonitis- Dr. MSpero Curb . Pulmonary hypertension ([place] 06/30/2017   patient denies-in office note from Dr. MSpero Curb  . Shortness of breath dyspnea    with exertion;Albuterol inhaler as needed  . Thyroid nodule     Past Surgical History:  Procedure Laterality Date  . BREAST CYST EXCISION Left 50 yrs ago  . CARPELL TUNNELL SURG Right   . COLONOSCOPY    . ESOPHAGOGASTRODUODENOSCOPY    . EYE SURGERY     bilateral cataract surgery with lens implants  . KNEE ARTHROSCOPY W/ MENISCAL REPAIR     left  . LUNG BIOPSY Right 11/20/2014   Procedure: LUNG BIOPSY;  Surgeon: SMelrose Nakayama MD;  [facility]  Service: Thoracic;  Laterality: Right;  .  REVERSE SHOULDER ARTHROPLASTY Right 06/07/2018   Procedure: REVERSE SHOULDER ARTHROPLASTY;  Surgeon: SJustice Britain MD;  [facility];  Service: Orthopedics;  Laterality: Right;  1230m  . TOTAL KNEE ARTHROPLASTY Left 08/13/2013   Procedure: LEFT TOTAL KNEE ARTHROPLASTY;  Surgeon: MaMauri PoleMD;  [facility];  Service: Orthopedics;  Laterality: Left;  . UMBILICAL HERNIA REPAIR    . VIDEO ASSISTED THORACOSCOPY Right 11/20/2014   Procedure: RIGHT  VIDEO ASSISTED THORACOSCOPY WITH WEDGE LUNG BIOPSIES RIGHT UPPER,MIDDLE AND LOWER LOBES ,PLACEMENT OF ON Q PAIN PUMP;  Surgeon: StMelrose NakayamaMD;  [facility]: [facility] Service: Thoracic;  Laterality: Right;    There were no vitals filed for this visit.  Subjective Assessment - 07/31/18 1151    Subjective  I pulled my shoulder reaching with it a few days ago.  Now it is OK.      Currently in Pain?  No/denies                       [facility] PT Treatment/Exercise - 07/31/18 0001      Shoulder Exercises: Supine   Flexion  Strengthening;Both;20 reps;Weights    Shoulder Flexion Weight (lbs)  1# in hand with straight arm    Other Supine Exercises  serratus punch 1# 2x10      Shoulder Exercises: Sidelying   External Rotation  AROM;20 reps    ABduction  AROM;Right;20 reps;Weights    ABduction Weight (lbs)  1      Shoulder Exercises: Standing   Flexion  AAROM;Right;20 reps   using UE Ranger     Shoulder Exercises: Pulleys   Flexion  3 minutes    Scaption  --      Shoulder Exercises: ROM/Strengthening   Ranger  seated flexion 2x10, circles CW and CCW 2 x10 each               PT Short Term Goals - 07/24/18 1213      PT SHORT TERM GOAL #1   Title  Pt will demo consistency and independence with her initial HEP to gradually improve shoulder ROM.    Time  6    Period  Weeks    Status  Achieved    Target Date  08/10/18      PT SHORT TERM GOAL #2   Title  Pt will have atleast 90 deg active Rt shoulder  flexion, 60 deg Rt shoulder abduction to allow for safe progression of strength.    Time  6    Period  Weeks    Status  Achieved        PT Long Term Goals - 07/24/18 1214      PT LONG TERM GOAL #1   Title  Pt will have atleast 110 deg active Rt shoulder flexion, 90 deg Rt shoulder abduction to allow for safe progression of strength.    Time  10    Period  Weeks    Status  New      PT LONG TERM GOAL #2   Title  Pt will have alteast 3+5 MMT strength of RUE to allow her to reach into her cabinet without difficulty.     Time  10    Period  Weeks    Status  New      PT LONG TERM GOAL #3   Title  Pt will report RUE functional use with no more than 2/10 pain throughout the day.    Time  10    Period  Weeks    Status  New      PT LONG TERM GOAL #4   Title  Pt's FOTO score will decrease to no greater than 35% limitation to reflect improvements in RUE use.     Time  10    Period  Weeks    Status  New      PT LONG TERM GOAL #5   Title  Pt will report atleast 60% improvement in Rt shoulder pain and use throughout the week.    Time  10    Period  Weeks    Status  New            Plan - 07/31/18 1204    Clinical Impression Statement  Pt is making progress s/p Rt total shoulder replacement surgery.  Continued focus is on promoting Rt shoulder ROM and strength while following post-op protocol.  Pt demonstrates muscular fatigue with strength exercises in supine and sidelying.  Pt requires tactile and verbal cues to reduce scapular elevation with flexion and abduction against gravity.  Pt will continue to benefit from skilled PT for Rt shoulder strength, ROM and endurance.      Rehab Potential  Good    PT Frequency  2x / week  PT Duration  --   10 weeks    PT Treatment/Interventions  ADLs/Self Care Home Management;Moist Heat;Cryotherapy;Therapeutic activities;Therapeutic exercise;Neuromuscular re-education;Patient/family education;Manual techniques;Passive range of motion;Dry  needling;Taping    PT Next Visit Plan   shoulder AAROM/AROM in allowed ROM; scap strengthening.  follow protocol      PT Home Exercise Plan  Access Code: [email or web address]     Consulted and Agree with Plan of Care  Patient       Patient will benefit from skilled therapeutic intervention in order to improve the following deficits and impairments:  Decreased endurance, Decreased mobility, Increased muscle spasms, Hypomobility, Postural dysfunction, Pain, Impaired UE functional use, Decreased strength, Decreased range of motion, Improper body mechanics  Visit Diagnosis: Stiffness of right shoulder, not elsewhere classified  Acute pain of right shoulder  Muscle weakness (generalized)     Problem List Patient Active Problem List   Diagnosis Date Noted  . 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 Chronic respiratory failure with hypoxia ([place]) 07/14/2017  . Diastolic CHF ([place]) 57/97/2820  . Pulmonary hypertension ([place]) 07/06/2016  . Physical deconditioning 07/06/2016  . Pulmonary fibrosis ([place]) 06/13/2016  . Acute on chronic respiratory failure with hypoxia ([place]) 06/13/2016  . Hypersensitivity pneumonitis ([place]) 06/13/2016  . Rheumatoid arthritis ([place]) 06/12/2015  . Acute bronchitis 12/16/2014  . Interstitial fibrosis ([place]) 11/20/2014  . Bronchiectasis without acute exacerbation ([place]) 06/30/2014  . Recurrent acute sinusitis 06/17/2014  . Interstitial lung disease ([place]) 06/17/2014  . Obese 08/14/2013  . 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 Obesity 12/17/2010  . HTN (hypertension) 12/17/2010  . LOW BACK PAIN SYNDROME 07/09/2007  . DYSPNEA 07/09/2007  . HYPERLIPIDEMIA 04/03/2007  . GERD 04/03/2007    Sigurd Sos, PT 07/31/18 12:28 PM  [facility] [address], [place], [place], [address] Phone: [phone]   Fax:  [phone]  Name: [name] MRN:  [id number] Date of Birth: [birth year]

## 2018-08-02 ENCOUNTER — Encounter: Payer: Self-pay | Admitting: Physical Therapy

## 2018-08-02 ENCOUNTER — Ambulatory Visit: Payer: Medicare Other | Admitting: Physical Therapy

## 2018-08-02 DIAGNOSIS — M25511 Pain in right shoulder: Secondary | ICD-10-CM

## 2018-08-02 DIAGNOSIS — M6281 Muscle weakness (generalized): Secondary | ICD-10-CM | POA: Diagnosis not present

## 2018-08-02 DIAGNOSIS — M25611 Stiffness of right shoulder, not elsewhere classified: Secondary | ICD-10-CM

## 2018-08-02 NOTE — Therapy (Signed)
[facility] [address], [place], [place], [address] Phone: [phone]   Fax:  [phone]  Physical Therapy Treatment  Patient Details  Name: [name] MRN: [id number] Date of Birth: 1940-09-17 Referring Provider (PT): Jenetta Loges, [place]   Encounter Date: 08/02/2018  PT End of Session - 08/02/18 1354    Visit Number  7    Date for PT Re-Evaluation  09/07/18    Authorization Type  Medicare and BCBS supplement    Authorization - Visit Number  7    Authorization - Number of Visits  10    PT Start Time  6712    PT Stop Time  1228    PT Time Calculation (min)  43 min    Activity Tolerance  No increased pain;Patient tolerated treatment well    Behavior During Therapy  [facility] for tasks assessed/performed       Past Medical History:  Diagnosis Date  . Allergic rhinitis    takes Claritin nightly  . Anxiety    but not on any meds  . Arthritis   . Asthma   . Carpal tunnel syndrome   . CHF (congestive heart failure) ([place])    patient denies-in office note 05/2015 by Aleene Davidson  . GERD (gastroesophageal reflux disease)   . History of bronchitis 3-73yr ago  . History of colon polyps    benign  . Joint pain   . Peripheral edema    takes Lasix daily  . Pneumonitis, hypersensitivity ([place] 05/2016   chronic hypersensitivity pneumonitis- Dr. MSpero Curb . Pulmonary hypertension ([place] 06/30/2017   patient denies-in office note from Dr. MSpero Curb  . Shortness of breath dyspnea    with exertion;Albuterol inhaler as needed  . Thyroid nodule     Past Surgical History:  Procedure Laterality Date  . BREAST CYST EXCISION Left 50 yrs ago  . CARPELL TUNNELL SURG Right   . COLONOSCOPY    . ESOPHAGOGASTRODUODENOSCOPY    . EYE SURGERY     bilateral cataract surgery with lens implants  . KNEE ARTHROSCOPY W/ MENISCAL REPAIR     left  . LUNG BIOPSY Right 11/20/2014   Procedure: LUNG BIOPSY;  Surgeon: SMelrose Nakayama MD;  [facility]: [facility]  Service: Thoracic;  Laterality: Right;  . REVERSE SHOULDER ARTHROPLASTY Right 06/07/2018   Procedure: REVERSE SHOULDER ARTHROPLASTY;  Surgeon: SJustice Britain MD;  [facility];  Service: Orthopedics;  Laterality: Right;  1255m  . TOTAL KNEE ARTHROPLASTY Left 08/13/2013   Procedure: LEFT TOTAL KNEE ARTHROPLASTY;  Surgeon: MaMauri PoleMD;  [facility];  Service: Orthopedics;  Laterality: Left;  . UMBILICAL HERNIA REPAIR    . VIDEO ASSISTED THORACOSCOPY Right 11/20/2014   Procedure: RIGHT  VIDEO ASSISTED THORACOSCOPY WITH WEDGE LUNG BIOPSIES RIGHT UPPER,MIDDLE AND LOWER LOBES ,PLACEMENT OF ON Q PAIN PUMP;  Surgeon: StMelrose NakayamaMD;  Location: [facility] Service: Thoracic;  Laterality: Right;    There were no vitals filed for this visit.  Subjective Assessment - 08/02/18 1152    Subjective  Pt reports that things are going well. She is a little stiff today but no pain.     Currently in Pain?  No/denies                       [facility]/Exercise - 08/02/18 0001      Shoulder Exercises: Seated   Flexion  AAROM;Right;10 reps  Flexion Limitations  x2 sets; scaption AAROM on Rt 2x10 reps     Other Seated Exercises  posterior shoulder rolls x20 reps       Shoulder Exercises: Sidelying   External Rotation  Strengthening;Right;15 reps    External Rotation Weight (lbs)  1    External Rotation Limitations  last set x20 reps with 10 sec isometric hold     Flexion  AROM;Strengthening;Right;20 reps    ABduction  AROM;Strengthening;Right;10 reps    ABduction Weight (lbs)  2    ABduction Limitations  last set with 1# x15 reps       Shoulder Exercises: ROM/Strengthening   Ranger  standing flexion x20 reps     Prot/Ret//Elev/Dep  closed chain protraction x10 reps on wall     Other ROM/Strengthening Exercises  Rt 90 deg shoulder circles CW and CCW x2 rounds 30 sec each       Shoulder Exercises: Stretch   Other  Shoulder Stretches  Rt upper trap stretch 3x20 sec              PT Education - 08/02/18 1225    Education Details  technique with therex and upcoming progressions of protocol     Person(s) Educated  Patient    Methods  Explanation    Comprehension  Verbalized understanding       PT Short Term Goals - 08/02/18 1354      PT SHORT TERM GOAL #1   Title  Pt will demo consistency and independence with her initial HEP to gradually improve shoulder ROM.    Time  6    Period  Weeks    Status  Achieved    Target Date  08/10/18      PT SHORT TERM GOAL #2   Title  Pt will have atleast 90 deg active Rt shoulder flexion, 60 deg Rt shoulder abduction to allow for safe progression of strength.    Time  6    Period  Weeks    Status  Achieved        PT Long Term Goals - 08/02/18 1355      PT LONG TERM GOAL #1   Title  Pt will have atleast 110 deg active Rt shoulder flexion, 90 deg Rt shoulder abduction to allow for safe progression of strength.    Time  10    Period  Weeks    Status  Partially Met      PT LONG TERM GOAL #2   Title  Pt will have alteast 3+5 MMT strength of RUE to allow her to reach into her cabinet without difficulty.     Time  10    Period  Weeks    Status  On-going      PT LONG TERM GOAL #3   Title  Pt will report RUE functional use with no more than 2/10 pain throughout the day.    Time  10    Period  Weeks    Status  New      PT LONG TERM GOAL #4   Title  Pt's FOTO score will decrease to no greater than 35% limitation to reflect improvements in RUE use.     Time  10    Period  Weeks    Status  New      PT LONG TERM GOAL #5   Title  Pt will report atleast 60% improvement in Rt shoulder pain and use throughout the week.    Time  10  Period  Weeks    Status  New            Plan - 08/02/18 1357    Clinical Impression Statement  Continued this visit with therex to promote shoulder strength and neuromuscular control. She was able to complete  progressions in resistance without increase in shoulder pain, however she does demonstrate shoulder shrug with elevation in various planes of motion. Introduced upper trap stretch which pt reports helped with tightness she was feeling in the area. Will continue to progress shoulder ROM and strength and eventually transition to d/c with HEP.     Rehab Potential  Good    PT Frequency  2x / week    PT Duration  --   10 weeks    PT Treatment/Interventions  ADLs/Self Care Home Management;Moist Heat;Cryotherapy;Therapeutic activities;Therapeutic exercise;Neuromuscular re-education;Patient/family education;Manual techniques;Passive range of motion;Dry needling;Taping    PT Next Visit Plan   shoulder AAROM/AROM in allowed ROM; gradually progress resistance; scap strengthening.  follow protocol      PT Home Exercise Plan  Access Code: [email or web address]     Consulted and Agree with Plan of Care  Patient       Patient will benefit from skilled therapeutic intervention in order to improve the following deficits and impairments:  Decreased endurance, Decreased mobility, Increased muscle spasms, Hypomobility, Postural dysfunction, Pain, Impaired UE functional use, Decreased strength, Decreased range of motion, Improper body mechanics  Visit Diagnosis: Stiffness of right shoulder, not elsewhere classified  Acute pain of right shoulder  Muscle weakness (generalized)     Problem List Patient Active Problem List   Diagnosis Date Noted  . 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 Chronic respiratory failure with hypoxia ([place]) 07/14/2017  . Diastolic CHF ([place]) 78/93/8101  . Pulmonary hypertension ([place]) 07/06/2016  . Physical deconditioning 07/06/2016  . Pulmonary fibrosis ([place]) 06/13/2016  . Acute on chronic respiratory failure with hypoxia ([place]) 06/13/2016  . Hypersensitivity pneumonitis ([place]) 06/13/2016  . Rheumatoid arthritis ([place]) 06/12/2015  .  Acute bronchitis 12/16/2014  . Interstitial fibrosis ([place]) 11/20/2014  . Bronchiectasis without acute exacerbation ([place]) 06/30/2014  . Recurrent acute sinusitis 06/17/2014  . Interstitial lung disease ([place]) 06/17/2014  . Obese 08/14/2013  . 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 Obesity 12/17/2010  . HTN (hypertension) 12/17/2010  . LOW BACK PAIN SYNDROME 07/09/2007  . DYSPNEA 07/09/2007  . HYPERLIPIDEMIA 04/03/2007  . GERD 04/03/2007   2:12 PM,08/02/18 Sherol Dade PT, DPT [place] [facility] [address], [place], [place], [address] Phone: [phone]   Fax:  [phone]  Name: [name] MRN: [id number] Date of Birth: 08-28-1940

## 2018-08-07 ENCOUNTER — Encounter: Payer: Self-pay | Admitting: Pulmonary Disease

## 2018-08-07 ENCOUNTER — Ambulatory Visit: Payer: Medicare Other | Attending: Orthopedic Surgery

## 2018-08-07 ENCOUNTER — Ambulatory Visit (INDEPENDENT_AMBULATORY_CARE_PROVIDER_SITE_OTHER): Payer: Medicare Other | Admitting: Pulmonary Disease

## 2018-08-07 VITALS — BP 138/70 | HR 94 | Ht 66.0 in | Wt 219.8 lb

## 2018-08-07 DIAGNOSIS — M6281 Muscle weakness (generalized): Secondary | ICD-10-CM | POA: Diagnosis not present

## 2018-08-07 DIAGNOSIS — J849 Interstitial pulmonary disease, unspecified: Secondary | ICD-10-CM

## 2018-08-07 DIAGNOSIS — M25511 Pain in right shoulder: Secondary | ICD-10-CM | POA: Insufficient documentation

## 2018-08-07 DIAGNOSIS — J479 Bronchiectasis, uncomplicated: Secondary | ICD-10-CM

## 2018-08-07 DIAGNOSIS — J9611 Chronic respiratory failure with hypoxia: Secondary | ICD-10-CM

## 2018-08-07 DIAGNOSIS — M25611 Stiffness of right shoulder, not elsewhere classified: Secondary | ICD-10-CM | POA: Diagnosis not present

## 2018-08-07 NOTE — Progress Notes (Signed)
Subjective:    Patient ID: [name], female    DOB: 1941/04/18, 78 y.o.   MRN: [id number]  Synopsis: Former patient of Dr. 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 She was seen at [facility] for a second opinion and they felt that she had hypersensitivity pneumonitis, chronic form.  She also has a history of rheumatoid arthritis.     HPI Chief Complaint  Patient presents with  . Follow-up    states breathing is better; on O2 3-4L   Augmentin helped with her cough and wheeze, though she needed an extended course of this.  She says that her breathing has been great recently.  She says that her shoulder pain has improved significantly, apparently she only took 1 ibuprofen after the surgery.  She says that her range of motion has improved.  She continues to use 3 L of oxygen continuously though sometimes she will take it off for up to 20 minutes at a time at daytime.  She uses her inhaled hypertonic saline as directed  Past Medical History:  Diagnosis Date  . Allergic rhinitis    takes Claritin nightly  . Anxiety    but not on any meds  . Arthritis   . Asthma   . Carpal tunnel syndrome   . CHF (congestive heart failure) ([place])    patient denies-in office note 05/2015 by Aleene Davidson  . GERD (gastroesophageal reflux disease)   . History of bronchitis 3-42yr ago  . History of colon polyps    benign  . Joint pain   . Peripheral edema    takes Lasix daily  . Pneumonitis, hypersensitivity ([place] 05/2016   chronic hypersensitivity pneumonitis- Dr. MSpero Curb . Pulmonary hypertension ([place] 06/30/2017   patient denies-in office note from Dr. MSpero Curb  . Shortness of breath dyspnea    with exertion;Albuterol inhaler as needed  . Thyroid nodule       Review of Systems  Constitutional: Negative for chills, fatigue and fever.  HENT: Negative for postnasal drip, rhinorrhea and sinus  pressure.   Respiratory: Positive for cough and wheezing. Negative for shortness of breath.   Cardiovascular: Negative for chest pain, palpitations and leg swelling.       Objective:   Physical Exam Vitals:   08/07/18 1610  BP: 138/70  Pulse: 94  SpO2: 96%  Weight: 219 lb 12.8 oz (99.7 kg)  Height: _0  (1.676 m)   3L pulse flow  Gen: well appearing, on oxygen HENT: OP clear, neck supple PULM: Crackles wheezes bases B, normal percussion CV: RRR, no mgr, trace edema GI: BS+, soft, nontender Derm: no cyanosis or rash Psyche: normal mood and affect     CBC    Component Value Date/Time   WBC 11.4 (H) 05/31/2018 1531   RBC 4.75 05/31/2018 1531   HGB 13.5 05/31/2018 1531   HCT 44.5 05/31/2018 1531   PLT 348 05/31/2018 1531   MCV 93.7 05/31/2018 1531   MCH 28.4 05/31/2018 1531   MCHC 30.3 05/31/2018 1531   RDW 13.8 05/31/2018 1531   LYMPHSABS 3.5 04/19/2017 1617   MONOABS 0.9 04/19/2017 1617   EOSABS 0.7 04/19/2017 1617   BASOSABS 0.1 04/19/2017 1617   Imaging: 06/2016 CT angiogram> no PE, superimposed ground glass changes in addition to baseline pulmonary fibrosis  Pulmonary function test: PFT's 2009:  FEV1 1.97 (84%),ratio 70, 22% increase FEV1 with BD, TLC 4.44 (82%), DLCO 16.7 (  62%) 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  PFT's January 2016:  FVC 2.13 (66%), no obstruction, TLC 4.00 (72%), DLCO 13.70 (48%) March 2017 pulmonary function testing ratio 88%, FVC 2.03 L (84% predicted), total lung capacity 3.0 L (54% predicted), DLCO 14.7 (51% predicted) April 2018 ratio 89%, FVC 1.91 L 61% predicted, total lung capacity 3.49 L 63% predicted, DLCO 12.69 44% predicted November 2018 pulmonary function testing ratio 90%, FVC 1.72 L 55% predicted, total lung capacity 3.37 L 61% predicted, DLCO 11.2 39% predicted  6MW 02/2015 6 Min Walk: 1218 feet. The patient's lowest oxygen  saturation was 90 %, highest heart rate was 118 bpm , and highest blood pressure was 180/80 07/2016 6 MW> 1320 feet 3 L O2 88%  Chest imaging: CT chest 2008:  Mild bronchiectasis, ?NSIP: CXR 05/2014:  Progressive IS changes compared to 2012. HRCT 04/2017: traction bronchiectasis and bronchovascular and peripheral fibrotic changes, worse in the upper lobes, some air trapping, no significant progression compared to 09/2016 November 2019 high-resolution CT scan of the chest images independently reviewed showing slight progression of her upper lobe predominant air trapping and fibrotic lung disease consistent with hypersensitivity pneumonitis  HRCT 2016:  Changes c/w fibrotic NSIP?  A little worse compared to 2008 Autoimmune panel 2016:  negative  Path: 11/2014 Open Lung biopsy PATH= UIP with granuloma  Microbiology: July 2019 sputum culture: Bacterial culture positive for oropharyngeal flora, fungal culture showed yeast, non-speciated, AFB culture negative      Assessment & Plan:  Bronchiectasis without acute exacerbation (HCC)  ILD (interstitial lung disease) (HCC)  Chronic respiratory failure with hypoxia (HCC)   Discussion: This has been a stable interval for [name].  Specifically she has not had an exacerbation of her ILD, though she did have some bronchiectasis flareup.  Today we talked extensively about taking nintedanib for chronic hypersensitivity pneumonitis, she says she is willing to proceed.  She understands that this is not a cure for this illness but can slow the progression.  Plan: Bronchiectasis: Use hypertonic saline twice a day to help clear mucus out of your lungs Use albuterol twice a day or as needed for shortness of breath  Chronic hypersensitivity pneumonitis: Start taking nintedanib 150 mg twice a day When you return in 4 to 6 weeks we will check your blood to make sure that there is no evidence of liver toxicity from this medicine (very rare) We will plan on  checking a lung function test later in the year  Chronic respiratory failure with hypoxemia: Keep using 3 L of oxygen continuously  We will plan on seeing you back in 4 to 6 weeks or sooner if needed  > 50% ofd this 29mn visit spent face to face   Current Outpatient Medications:  .  acetaminophen (TYLENOL) 650 MG CR tablet, Take 650-1,300 mg by mouth every 8 (eight) hours as needed for pain., Disp: , Rfl:  .  albuterol (PROVENTIL HFA;VENTOLIN HFA) 108 (90 Base) MCG/ACT inhaler, Inhale 2 puffs into the lungs every 6 (six) hours as needed for wheezing or shortness of breath., Disp: 1 Inhaler, Rfl: 5 .  aspirin 325 MG tablet, Take 650 mg by mouth every 6 (six) hours as needed for mild pain or moderate pain. , Disp: , Rfl:  .  fluticasone (FLONASE) 50 MCG/ACT nasal spray, Place 1 spray into both nostrils daily as needed for allergies. , Disp: , Rfl:  .  furosemide (LASIX) 20 MG tablet, Take 20 mg by mouth daily as needed for edema. , Disp: , Rfl:  .  ibuprofen (ADVIL,MOTRIN) 200 MG tablet, Take 400 mg by mouth every 6 (six) hours as needed for mild pain or moderate pain. , Disp: , Rfl:  .  loratadine (CLARITIN) 5 MG chewable tablet, Chew 5 mg by mouth daily as needed for allergies., Disp: , Rfl:  .  Respiratory Therapy Supplies (FLUTTER) DEVI, Use as directed., Disp: 1 each, Rfl: 0 .  sodium chloride HYPERTONIC 3 % nebulizer solution, Take by nebulization 2 (two) times daily. (Patient taking differently: Take 4 mLs by nebulization 2 (two) times daily. ), Disp: 750 mL, Rfl: 12

## 2018-08-07 NOTE — Therapy (Signed)
[facility] [address] [place], [place], [address] Phone: [phone]   Fax:  [phone]  Physical Therapy Treatment  Patient Details  Name: [name] MRN: [id number] Date of Birth: 04-30-1941 Referring Provider (PT): Jenetta Loges, [place]   Encounter Date: 08/07/2018  PT End of Session - 08/07/18 1228    Visit Number  8    Date for PT Re-Evaluation  09/07/18    Authorization Type  Medicare and BCBS supplement    PT Start Time  7579    PT Stop Time  1226    PT Time Calculation (min)  41 min    Activity Tolerance  No increased pain;Patient tolerated treatment well    Behavior During Therapy  [facility] for tasks assessed/performed       Past Medical History:  Diagnosis Date  . Allergic rhinitis    takes Claritin nightly  . Anxiety    but not on any meds  . Arthritis   . Asthma   . Carpal tunnel syndrome   . CHF (congestive heart failure) ([place])    patient denies-in office note 05/2015 by Aleene Davidson  . GERD (gastroesophageal reflux disease)   . History of bronchitis 3-59yr ago  . History of colon polyps    benign  . Joint pain   . Peripheral edema    takes Lasix daily  . Pneumonitis, hypersensitivity ([place] 05/2016   chronic hypersensitivity pneumonitis- Dr. MSpero Curb . Pulmonary hypertension ([place] 06/30/2017   patient denies-in office note from Dr. MSpero Curb  . Shortness of breath dyspnea    with exertion;Albuterol inhaler as needed  . Thyroid nodule     Past Surgical History:  Procedure Laterality Date  . BREAST CYST EXCISION Left 50 yrs ago  . CARPELL TUNNELL SURG Right   . COLONOSCOPY    . ESOPHAGOGASTRODUODENOSCOPY    . EYE SURGERY     bilateral cataract surgery with lens implants  . KNEE ARTHROSCOPY W/ MENISCAL REPAIR     left  . LUNG BIOPSY Right 11/20/2014   Procedure: LUNG BIOPSY;  Surgeon: SMelrose Nakayama MD;  [facility]  Service: Thoracic;  Laterality: Right;  .  REVERSE SHOULDER ARTHROPLASTY Right 06/07/2018   Procedure: REVERSE SHOULDER ARTHROPLASTY;  Surgeon: SJustice Britain MD;  [facility];  Service: Orthopedics;  Laterality: Right;  127m  . TOTAL KNEE ARTHROPLASTY Left 08/13/2013   Procedure: LEFT TOTAL KNEE ARTHROPLASTY;  Surgeon: MaMauri PoleMD;  [facility];  Service: Orthopedics;  Laterality: Left;  . UMBILICAL HERNIA REPAIR    . VIDEO ASSISTED THORACOSCOPY Right 11/20/2014   Procedure: RIGHT  VIDEO ASSISTED THORACOSCOPY WITH WEDGE LUNG BIOPSIES RIGHT UPPER,MIDDLE AND LOWER LOBES ,PLACEMENT OF ON Q PAIN PUMP;  Surgeon: StMelrose NakayamaMD;  [facility] Service: Thoracic;  Laterality: Right;    There were no vitals filed for this visit.  Subjective Assessment - 08/07/18 1152    Subjective  I am reaching with my arm into my closet.      Currently in Pain?  No/denies         [facility] - 08/07/18 0001      ROM / Strength   AROM / PROM / Strength  AROM      AROM   Overall AROM   Deficits    AROM Assessment Site  Shoulder    Right/Left Shoulder  Right    Right Shoulder Flexion  115 Degrees  Right Shoulder ABduction  76 Degrees                   [facility] Adult PT Treatment/Exercise - 08/07/18 0001      Shoulder Exercises: Supine   Flexion  Strengthening;Both;20 reps;Weights    Shoulder Flexion Weight (lbs)  2# in hand with straight arm    Other Supine Exercises  punch to ceiling in supine: 2# 2x10 with verbal cues for eccentric control      Shoulder Exercises: Seated   Flexion  AROM;Strengthening;Right;20 reps;Weights    Flexion Weight (lbs)  1    Flexion Limitations   scaption AROM on Rt 2x10 reps       Shoulder Exercises: Standing   Flexion  AAROM;Right;20 reps   using UE Ranger     Shoulder Exercises: Pulleys   Flexion  3 minutes      Shoulder Exercises: ROM/Strengthening   Prot/Ret//Elev/Dep  closed chain protraction x10 reps on wall     Other ROM/Strengthening Exercises  Rt  90 deg shoulder circles CW and CCW x2 rounds 30 sec each                PT Short Term Goals - 08/02/18 1354      PT SHORT TERM GOAL #1   Title  Pt will demo consistency and independence with her initial HEP to gradually improve shoulder ROM.    Time  6    Period  Weeks    Status  Achieved    Target Date  08/10/18      PT SHORT TERM GOAL #2   Title  Pt will have atleast 90 deg active Rt shoulder flexion, 60 deg Rt shoulder abduction to allow for safe progression of strength.    Time  6    Period  Weeks    Status  Achieved        PT Long Term Goals - 08/07/18 1213      PT LONG TERM GOAL #1   Title  Pt will have atleast 110 deg active Rt shoulder flexion, 90 deg Rt shoulder abduction to allow for safe progression of strength.    Baseline  flexion 115, abduction 76 degrees     Time  10    Period  Weeks    Status  On-going      PT LONG TERM GOAL #3   Title  Pt will report RUE functional use with no more than 2/10 pain throughout the day.    Status  Achieved      PT LONG TERM GOAL #5   Title  Pt will report atleast 60% improvement in Rt shoulder pain and use throughout the week.    Baseline  65-70%    Time  10    Period  Weeks    Status  On-going            Plan - 08/07/18 1219    Clinical Impression Statement  Pt is making excellent progress s/p Rt total shoulder replacement.  Pt with improved Rt shoulder A/ROM flexion to 115 degrees and abduction to 76 degrees.  Pt reports 65-70% use of the Rt UE at home.  Pt with improved ability to move against gravity with strengthening exercise and requires minor tactile cues for technique.  Pt will continue to benefit from skilled PT for Rt shoulder strength, flexibility and endurance per protocol.      Rehab Potential  Good    PT Frequency  2x / week  PT Duration  --   10 weeks   PT Treatment/Interventions  ADLs/Self Care Home Management;Moist Heat;Cryotherapy;Therapeutic activities;Therapeutic exercise;Neuromuscular  re-education;Patient/family education;Manual techniques;Passive range of motion;Dry needling;Taping    PT Next Visit Plan   shoulder AAROM/AROM in allowed ROM; gradually progress resistance; scap strengthening.  follow protocol      PT Home Exercise Plan  Access Code: [email or web address]     Consulted and Agree with Plan of Care  Patient       Patient will benefit from skilled therapeutic intervention in order to improve the following deficits and impairments:  Decreased endurance, Decreased mobility, Increased muscle spasms, Hypomobility, Postural dysfunction, Pain, Impaired UE functional use, Decreased strength, Decreased range of motion, Improper body mechanics  Visit Diagnosis: Acute pain of right shoulder  Stiffness of right shoulder, not elsewhere classified  Muscle weakness (generalized)     Problem List Patient Active Problem List   Diagnosis Date Noted  . 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 Chronic respiratory failure with hypoxia ([place]) 07/14/2017  . Diastolic CHF ([place]) 19/92/4155  . Pulmonary hypertension ([place]) 07/06/2016  . Physical deconditioning 07/06/2016  . Pulmonary fibrosis ([place]) 06/13/2016  . Acute on chronic respiratory failure with hypoxia ([place]) 06/13/2016  . Hypersensitivity pneumonitis ([place]) 06/13/2016  . Rheumatoid arthritis ([place]) 06/12/2015  . Acute bronchitis 12/16/2014  . Interstitial fibrosis ([place]) 11/20/2014  . Bronchiectasis without acute exacerbation ([place]) 06/30/2014  . Recurrent acute sinusitis 06/17/2014  . Interstitial lung disease ([place]) 06/17/2014  . Obese 08/14/2013  . 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 Obesity 12/17/2010  . HTN (hypertension) 12/17/2010  . LOW BACK PAIN SYNDROME 07/09/2007  . DYSPNEA 07/09/2007  . HYPERLIPIDEMIA 04/03/2007  . GERD 04/03/2007   Sigurd Sos, PT 08/07/18 12:31 PM  [facility] [address], [place], [place], [address] Phone: [phone]   Fax:  [phone]  Name: [name] MRN: [id number] Date of Birth: [birth year]

## 2018-08-07 NOTE — Patient Instructions (Signed)
Bronchiectasis: Use hypertonic saline twice a day to help clear mucus out of your lungs Use albuterol twice a day or as needed for shortness of breath  Chronic hypersensitivity pneumonitis: Start taking nintedanib 150 mg twice a day When you return in 4 to 6 weeks we will check your blood to make sure that there is no evidence of liver toxicity from this medicine (very rare) We will plan on checking a lung function test later in the year  Chronic respiratory failure with hypoxemia: Keep using 3 L of oxygen continuously  We will plan on seeing you back in 4 to 6 weeks or sooner if needed

## 2018-08-09 ENCOUNTER — Ambulatory Visit: Payer: Medicare Other

## 2018-08-09 DIAGNOSIS — M6281 Muscle weakness (generalized): Secondary | ICD-10-CM | POA: Diagnosis not present

## 2018-08-09 DIAGNOSIS — M25611 Stiffness of right shoulder, not elsewhere classified: Secondary | ICD-10-CM | POA: Diagnosis not present

## 2018-08-09 DIAGNOSIS — M25511 Pain in right shoulder: Secondary | ICD-10-CM | POA: Diagnosis not present

## 2018-08-09 NOTE — Therapy (Signed)
[facility] [address] [place], [place], [address] Phone: [phone]   Fax:  [phone]  Physical Therapy Treatment  Patient Details  Name: [name] MRN: [id number] Date of Birth: 1940/11/21 Referring Provider (PT): Jenetta Loges, [place]   Encounter Date: 08/09/2018  PT End of Session - 08/09/18 1219    Visit Number  9    Date for PT Re-Evaluation  09/07/18    Authorization Type  Medicare and BCBS supplement    PT Start Time  9024    PT Stop Time  1219    PT Time Calculation (min)  30 min    Activity Tolerance  No increased pain;Patient tolerated treatment well    Behavior During Therapy  [facility] for tasks assessed/performed       Past Medical History:  Diagnosis Date  . Allergic rhinitis    takes Claritin nightly  . Anxiety    but not on any meds  . Arthritis   . Asthma   . Carpal tunnel syndrome   . CHF (congestive heart failure) ([place])    patient denies-in office note 05/2015 by Aleene Davidson  . GERD (gastroesophageal reflux disease)   . History of bronchitis 3-85yr ago  . History of colon polyps    benign  . Joint pain   . Peripheral edema    takes Lasix daily  . Pneumonitis, hypersensitivity ([place] 05/2016   chronic hypersensitivity pneumonitis- Dr. MSpero Curb . Pulmonary hypertension ([place] 06/30/2017   patient denies-in office note from Dr. MSpero Curb  . Shortness of breath dyspnea    with exertion;Albuterol inhaler as needed  . Thyroid nodule     Past Surgical History:  Procedure Laterality Date  . BREAST CYST EXCISION Left 50 yrs ago  . CARPELL TUNNELL SURG Right   . COLONOSCOPY    . ESOPHAGOGASTRODUODENOSCOPY    . EYE SURGERY     bilateral cataract surgery with lens implants  . KNEE ARTHROSCOPY W/ MENISCAL REPAIR     left  . LUNG BIOPSY Right 11/20/2014   Procedure: LUNG BIOPSY;  Surgeon: SMelrose Nakayama MD;  [facility]  Service: Thoracic;  Laterality: Right;  .  REVERSE SHOULDER ARTHROPLASTY Right 06/07/2018   Procedure: REVERSE SHOULDER ARTHROPLASTY;  Surgeon: SJustice Britain MD;  [facility];  Service: Orthopedics;  Laterality: Right;  1235m  . TOTAL KNEE ARTHROPLASTY Left 08/13/2013   Procedure: LEFT TOTAL KNEE ARTHROPLASTY;  Surgeon: MaMauri PoleMD;  [facility];  Service: Orthopedics;  Laterality: Left;  . UMBILICAL HERNIA REPAIR    . VIDEO ASSISTED THORACOSCOPY Right 11/20/2014   Procedure: RIGHT  VIDEO ASSISTED THORACOSCOPY WITH WEDGE LUNG BIOPSIES RIGHT UPPER,MIDDLE AND LOWER LOBES ,PLACEMENT OF ON Q PAIN PUMP;  Surgeon: StMelrose NakayamaMD;  [facility] Service: Thoracic;  Laterality: Right;    There were no vitals filed for this visit.                    [facility] Treatment/Exercise - 08/09/18 0001      Shoulder Exercises: Supine   Flexion  --    Shoulder Flexion Weight (lbs)  --    Other Supine Exercises  --      Shoulder Exercises: Seated   Flexion  AROM;Strengthening;Right;20 reps;Weights    Flexion Weight (lbs)  1    Flexion Limitations   scaption AROM on Rt 2x10 reps       Shoulder Exercises: Sidelying  External Rotation  Strengthening;Right;20 reps    External Rotation Weight (lbs)  1    ABduction  AROM;Strengthening;Right;10 reps    ABduction Weight (lbs)  1      Shoulder Exercises: Standing   Flexion  AAROM;Right;20 reps   using UE Ranger   Flexion Limitations  finger ladder with 1# added x10    tactile cues for scapular depression   Other Standing Exercises  cone stack: 1# added to 2nd shelf x 2 minutes      Shoulder Exercises: Pulleys   Flexion  3 minutes      Shoulder Exercises: ROM/Strengthening   Prot/Ret//Elev/Dep  closed chain protraction x10 reps on wall     Other ROM/Strengthening Exercises  Rt 90 deg shoulder circles CW and CCW x2 rounds 30 sec each                PT Short Term Goals - 08/02/18 1354      PT SHORT TERM GOAL #1   Title  Pt will demo  consistency and independence with her initial HEP to gradually improve shoulder ROM.    Time  6    Period  Weeks    Status  Achieved    Target Date  08/10/18      PT SHORT TERM GOAL #2   Title  Pt will have atleast 90 deg active Rt shoulder flexion, 60 deg Rt shoulder abduction to allow for safe progression of strength.    Time  6    Period  Weeks    Status  Achieved        PT Long Term Goals - 08/07/18 1213      PT LONG TERM GOAL #1   Title  Pt will have atleast 110 deg active Rt shoulder flexion, 90 deg Rt shoulder abduction to allow for safe progression of strength.    Baseline  flexion 115, abduction 76 degrees     Time  10    Period  Weeks    Status  On-going      PT LONG TERM GOAL #3   Title  Pt will report RUE functional use with no more than 2/10 pain throughout the day.    Status  Achieved      PT LONG TERM GOAL #5   Title  Pt will report atleast 60% improvement in Rt shoulder pain and use throughout the week.    Baseline  65-70%    Time  10    Period  Weeks    Status  On-going            Plan - 08/09/18 1155    Clinical Impression Statement  Pt continues to improve strength and active use of Rt UE s/p total shoulder replacement.  Pt had Rt UT pain after last session and this has now resolved.  Pt requires tactile cues to reduce upper trap activation with use of UE against gravity and with endurance activities.  Pt did well with reaching overhead with 1# weight today.  Pt will continue to benefit from skilled PT for strength, endurance and flexibility of Rt arm to improve use and function of Rt UE.      Rehab Potential  Good    PT Frequency  2x / week    PT Treatment/Interventions  ADLs/Self Care Home Management;Moist Heat;Cryotherapy;Therapeutic activities;Therapeutic exercise;Neuromuscular re-education;Patient/family education;Manual techniques;Passive range of motion;Dry needling;Taping    PT Next Visit Plan   shoulder AAROM/AROM in allowed ROM; gradually  progress resistance; scap strengthening.  follow protocol      PT Home Exercise Plan  Access Code: [email or web address]     Recommended Other Services  cert is signed    Consulted and Agree with Plan of Care  Patient       Patient will benefit from skilled therapeutic intervention in order to improve the following deficits and impairments:  Decreased endurance, Decreased mobility, Increased muscle spasms, Hypomobility, Postural dysfunction, Pain, Impaired UE functional use, Decreased strength, Decreased range of motion, Improper body mechanics  Visit Diagnosis: Acute pain of right shoulder  Stiffness of right shoulder, not elsewhere classified  Muscle weakness (generalized)     Problem List Patient Active Problem List   Diagnosis Date Noted  . 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 Chronic respiratory failure with hypoxia ([place]) 07/14/2017  . Diastolic CHF ([place]) 71/69/6789  . Pulmonary hypertension ([place]) 07/06/2016  . Physical deconditioning 07/06/2016  . Pulmonary fibrosis ([place]) 06/13/2016  . Acute on chronic respiratory failure with hypoxia ([place]) 06/13/2016  . Hypersensitivity pneumonitis ([place]) 06/13/2016  . Rheumatoid arthritis ([place]) 06/12/2015  . Acute bronchitis 12/16/2014  . Interstitial fibrosis ([place]) 11/20/2014  . Bronchiectasis without acute exacerbation ([place]) 06/30/2014  . Recurrent acute sinusitis 06/17/2014  . Interstitial lung disease ([place]) 06/17/2014  . Obese 08/14/2013  . 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 Obesity 12/17/2010  . HTN (hypertension) 12/17/2010  . LOW BACK PAIN SYNDROME 07/09/2007  . DYSPNEA 07/09/2007  . HYPERLIPIDEMIA 04/03/2007  . GERD 04/03/2007    Sigurd Sos, PT 08/09/18 12:22 PM  [facility] [address], [place], [place], [address] Phone: [phone]   Fax:  [phone]  Name: [name] MRN: [id number] Date of  Birth: 1941/04/13

## 2018-08-10 ENCOUNTER — Telehealth: Payer: Self-pay | Admitting: Pulmonary Disease

## 2018-08-10 NOTE — Telephone Encounter (Signed)
Patient saw BQ on 08/10/2018 and was advised to start Ofev 132m. Paperwork has been signed by both the patient and BQ.   I [facility] the RX to [facility]   Will keep the forms in my blue look-at folder.   Will keep this encounter open in case I receive any follow-ups.

## 2018-08-14 ENCOUNTER — Ambulatory Visit: Payer: Medicare Other

## 2018-08-14 DIAGNOSIS — M6281 Muscle weakness (generalized): Secondary | ICD-10-CM

## 2018-08-14 DIAGNOSIS — M25611 Stiffness of right shoulder, not elsewhere classified: Secondary | ICD-10-CM

## 2018-08-14 DIAGNOSIS — M25511 Pain in right shoulder: Secondary | ICD-10-CM

## 2018-08-14 NOTE — Therapy (Signed)
[facility] [address], [place], [place], [address] Phone: [phone]   Fax:  [phone]  Physical Therapy Treatment  Patient Details  Name: [name] MRN: [id number] Date of Birth: [birth year] Referring Provider (PT): Jenetta Loges, [place]   Encounter Date: 08/14/2018 Progress Note Reporting Period  06/29/18 to 08/14/2018  See note below for Objective Data and Assessment of Progress/Goals.      PT End of Session - 08/14/18 1230    Visit Number  10    Date for PT Re-Evaluation  09/07/18    Authorization Type  Medicare and BCBS supplement    PT Start Time  1156    PT Stop Time  1229    PT Time Calculation (min)  33 min    Activity Tolerance  No increased pain;Patient tolerated treatment well    Behavior During Therapy  [facility] for tasks assessed/performed       Past Medical History:  Diagnosis Date  . Allergic rhinitis    takes Claritin nightly  . Anxiety    but not on any meds  . Arthritis   . Asthma   . Carpal tunnel syndrome   . CHF (congestive heart failure) ([place])    patient denies-in office note 05/2015 by Aleene Davidson  . GERD (gastroesophageal reflux disease)   . History of bronchitis 3-34yr ago  . History of colon polyps    benign  . Joint pain   . Peripheral edema    takes Lasix daily  . Pneumonitis, hypersensitivity ([place] 05/2016   chronic hypersensitivity pneumonitis- Dr. MSpero Curb . Pulmonary hypertension ([place] 06/30/2017   patient denies-in office note from Dr. MSpero Curb  . Shortness of breath dyspnea    with exertion;Albuterol inhaler as needed  . Thyroid nodule     Past Surgical History:  Procedure Laterality Date  . BREAST CYST EXCISION Left 50 yrs ago  . CARPELL TUNNELL SURG Right   . COLONOSCOPY    . ESOPHAGOGASTRODUODENOSCOPY    . EYE SURGERY     bilateral cataract surgery with lens implants  . KNEE ARTHROSCOPY W/ MENISCAL REPAIR     left  . LUNG BIOPSY Right  11/20/2014   Procedure: LUNG BIOPSY;  Surgeon: SMelrose Nakayama MD;  Location: [facility]  Service: Thoracic;  Laterality: Right;  . REVERSE SHOULDER ARTHROPLASTY Right 06/07/2018   Procedure: REVERSE SHOULDER ARTHROPLASTY;  Surgeon: SJustice Britain MD;  [facility];  Service: Orthopedics;  Laterality: Right;  1213m  . TOTAL KNEE ARTHROPLASTY Left 08/13/2013   Procedure: LEFT TOTAL KNEE ARTHROPLASTY;  Surgeon: MaMauri PoleMD;  [facility];  Service: Orthopedics;  Laterality: Left;  . UMBILICAL HERNIA REPAIR    . VIDEO ASSISTED THORACOSCOPY Right 11/20/2014   Procedure: RIGHT  VIDEO ASSISTED THORACOSCOPY WITH WEDGE LUNG BIOPSIES RIGHT UPPER,MIDDLE AND LOWER LOBES ,PLACEMENT OF ON Q PAIN PUMP;  Surgeon: StMelrose NakayamaMD;  Location: [facility] Service: Thoracic;  Laterality: Right;    There were no vitals filed for this visit.  Subjective Assessment - 08/14/18 1156    Subjective  I'm doing well.  No pain.  I was sore after last session but not too bad.      Currently in Pain?  No/denies         [facility] - 08/14/18 0001      Assessment   Medical Diagnosis  Reverse Rt shoulder replacement     Referring Provider (PT)  TrJenetta LogesPA-C  Onset Date/Surgical Date  06/07/18    Hand Dominance  Right      Observation/Other Assessments   Focus on Therapeutic Outcomes (FOTO)   41% limited      ROM / Strength   AROM / PROM / Strength  AROM      AROM   Overall AROM   Deficits    AROM Assessment Site  Shoulder    Right/Left Shoulder  Right    Right Shoulder Flexion  120 Degrees    Right Shoulder ABduction  76 Degrees      Strength   Overall Strength  Deficits    Strength Assessment Site  Shoulder    Right/Left Shoulder  Right    Right Shoulder Flexion  3-/5    Right Shoulder ABduction  2+/5    Right Shoulder Internal Rotation  2+/5    Right Shoulder External Rotation  3/5                   [facility] Adult PT Treatment/Exercise - 08/14/18 0001       Shoulder Exercises: Seated   Flexion  AROM;Strengthening;Right;20 reps;Weights    Flexion Weight (lbs)  1    Flexion Limitations   scaption AROM on Rt 2x10 reps 1# added       Shoulder Exercises: Sidelying   External Rotation  Strengthening;Right;20 reps    External Rotation Weight (lbs)  1    ABduction  AROM;Strengthening;Right;10 reps    ABduction Weight (lbs)  1      Shoulder Exercises: Standing   Flexion  AAROM;Right;20 reps   using UE Ranger   Flexion Limitations  finger ladder with 1# added x10    tactile cues for scapular depression   Other Standing Exercises  cone stack: 1# added to 2nd shelf x 2 minutes      Shoulder Exercises: Pulleys   Flexion  3 minutes      Shoulder Exercises: ROM/Strengthening   Prot/Ret//Elev/Dep  closed chain protraction x10 reps on wall     Other ROM/Strengthening Exercises  Rt 90 deg shoulder circles CW and CCW x2 rounds 30 sec each                PT Short Term Goals - 08/02/18 1354      PT SHORT TERM GOAL #1   Title  Pt will demo consistency and independence with her initial HEP to gradually improve shoulder ROM.    Time  6    Period  Weeks    Status  Achieved    Target Date  08/10/18      PT SHORT TERM GOAL #2   Title  Pt will have atleast 90 deg active Rt shoulder flexion, 60 deg Rt shoulder abduction to allow for safe progression of strength.    Time  6    Period  Weeks    Status  Achieved        PT Long Term Goals - 08/14/18 1156      PT LONG TERM GOAL #1   Title  Pt will have atleast 110 deg active Rt shoulder flexion, 90 deg Rt shoulder abduction to allow for safe progression of strength.    Baseline  flexion 120 degrees, abduction 75    Time  10    Period  Weeks    Status  On-going      PT LONG TERM GOAL #2   Title  Pt will have alteast 3+5 MMT strength of RUE to allow her to  reach into her cabinet without difficulty.     Time  10    Period  Weeks    Status  On-going      PT LONG TERM GOAL #3   Title   Pt will report RUE functional use with no more than 2/10 pain throughout the day.    Baseline  No pain with use    Status  Achieved      PT LONG TERM GOAL #4   Title  Pt's FOTO score will decrease to no greater than 35% limitation to reflect improvements in RUE use.     Baseline  41% limitation    Time  10    Period  Weeks    Status  On-going      PT LONG TERM GOAL #5   Title  Pt will report atleast 60% improvement in Rt shoulder pain and use throughout the week.    Status  Achieved            Plan - 08/14/18 1211    Clinical Impression Statement  Pt is making consistent and steady gains s/p Rt shoulder replacement.  Pt is using Rt UE 75% of normal at this time with home tasks.  Rt shoulder A/ROM into flexion and abduction have improved.  Strength is improved from evaluation and still functionally weak.  FOTO is improved to 41% limitation.  Pt continues to address strength with progression of exercise. Continued progression is expected s/p surgery with this advancement.  Pt requires tactile and verbal cues to reduce scapular substitution with Rt UE movement against gravity.      Rehab Potential  Good    PT Frequency  2x / week    PT Duration  --   10 weeks   PT Treatment/Interventions  ADLs/Self Care Home Management;Moist Heat;Cryotherapy;Therapeutic activities;Therapeutic exercise;Neuromuscular re-education;Patient/family education;Manual techniques;Passive range of motion;Dry needling;Taping    PT Next Visit Plan   gradually progress resistance; scap strengthening.  follow protocol      PT Home Exercise Plan  Access Code: [email or web address]     Consulted and Agree with Plan of Care  Patient       Patient will benefit from skilled therapeutic intervention in order to improve the following deficits and impairments:  Decreased endurance, Decreased mobility, Increased muscle spasms, Hypomobility, Postural dysfunction, Pain, Impaired UE functional use, Decreased strength, Decreased range of  motion, Improper body mechanics  Visit Diagnosis: Acute pain of right shoulder  Stiffness of right shoulder, not elsewhere classified  Muscle weakness (generalized)     Problem List Patient Active Problem List   Diagnosis Date Noted  . 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 Chronic respiratory failure with hypoxia ([place]) 07/14/2017  . Diastolic CHF ([place]) 95/02/3266  . Pulmonary hypertension ([place]) 07/06/2016  . Physical deconditioning 07/06/2016  . Pulmonary fibrosis ([place]) 06/13/2016  . Acute on chronic respiratory failure with hypoxia ([place]) 06/13/2016  . Hypersensitivity pneumonitis ([place]) 06/13/2016  . Rheumatoid arthritis ([place]) 06/12/2015  . Acute bronchitis 12/16/2014  . 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 Bronchiectasis without acute exacerbation ([place]) 06/30/2014  . Recurrent acute sinusitis 06/17/2014  . Interstitial lung disease ([place]) 06/17/2014  . Obese 08/14/2013  . 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 Obesity 12/17/2010  . HTN (hypertension) 12/17/2010  . LOW BACK PAIN SYNDROME 07/09/2007  . DYSPNEA 07/09/2007  . HYPERLIPIDEMIA 04/03/2007  . GERD 04/03/2007   Sigurd Sos, PT 08/14/18 12:31 PM  [facility] [address], [place], [place], [address] Phone: [phone]   Fax:  [phone]  Name: [name] MRN: [id number] Date of Birth: 1940-12-27

## 2018-08-16 ENCOUNTER — Encounter: Payer: Self-pay | Admitting: Physical Therapy

## 2018-08-16 ENCOUNTER — Ambulatory Visit: Payer: Medicare Other | Admitting: Physical Therapy

## 2018-08-16 ENCOUNTER — Other Ambulatory Visit: Payer: Self-pay

## 2018-08-16 DIAGNOSIS — M25611 Stiffness of right shoulder, not elsewhere classified: Secondary | ICD-10-CM | POA: Diagnosis not present

## 2018-08-16 DIAGNOSIS — M6281 Muscle weakness (generalized): Secondary | ICD-10-CM

## 2018-08-16 DIAGNOSIS — M25511 Pain in right shoulder: Secondary | ICD-10-CM | POA: Diagnosis not present

## 2018-08-16 NOTE — Patient Instructions (Signed)
Access Code: [email or web address]  URL: [email or web address]  Date: 08/16/2018  Prepared by: Sherol Dade   Exercises  Sidelying Shoulder Abduction Palm Forward - 10 reps - 2 sets - 1x daily - 7x weekly  Seated Shoulder Flexion with Dumbbells - 10 reps - 3 sets - 1x daily - 7x weekly  Shoulder extension with resistance - Neutral - 10 reps - 2 sets - 1x daily - 7x weekly  Sidelying Shoulder External Rotation - 10 reps - 2 sets - 1x daily - 7x weekly  Seated Gentle Upper Trapezius Stretch - 3 reps - 1 sets - 30 sec hold - 1x daily - 7x weekly  Seated Shoulder Flexion AAROM with Dowel - 10 reps - 3 sets - 1x daily - 7x weekly    [facility] [address] [place], [place] [address] Phone # [phone] Fax [phone]

## 2018-08-16 NOTE — Telephone Encounter (Signed)
I have not heard or received any updates.

## 2018-08-16 NOTE — Telephone Encounter (Signed)
[name], please advise if you have an update on this. Thanks!

## 2018-08-17 ENCOUNTER — Telehealth: Payer: Self-pay | Admitting: Pulmonary Disease

## 2018-08-17 NOTE — Therapy (Addendum)
[facility] [address] [place], [place], [address] Phone: [phone]   Fax:  [phone]  Physical Therapy Treatment  Patient Details  Name: [name] MRN: [id number] Date of Birth: 1940-12-04 Referring Provider (PT): Jenetta Loges, [place]   Encounter Date: 08/16/2018  PT End of Session - 08/16/18 1208    Visit Number  11    Date for PT Re-Evaluation  09/07/18    Authorization Type  Medicare and BCBS supplement    Authorization - Visit Number  1    Authorization - Number of Visits  10    PT Start Time  0354   pt arrived late to her appointment    PT Stop Time  1228    PT Time Calculation (min)  35 min    Activity Tolerance  No increased pain;Patient tolerated treatment well    Behavior During Therapy  [facility] for tasks assessed/performed       Past Medical History:  Diagnosis Date  . Allergic rhinitis    takes Claritin nightly  . Anxiety    but not on any meds  . Arthritis   . Asthma   . Carpal tunnel syndrome   . CHF (congestive heart failure) ([place])    patient denies-in office note 05/2015 by Aleene Davidson  . GERD (gastroesophageal reflux disease)   . History of bronchitis 3-9yr ago  . History of colon polyps    benign  . Joint pain   . Peripheral edema    takes Lasix daily  . Pneumonitis, hypersensitivity ([place] 05/2016   chronic hypersensitivity pneumonitis- Dr. MSpero Curb . Pulmonary hypertension ([place] 06/30/2017   patient denies-in office note from Dr. MSpero Curb  . Shortness of breath dyspnea    with exertion;Albuterol inhaler as needed  . Thyroid nodule     Past Surgical History:  Procedure Laterality Date  . BREAST CYST EXCISION Left 50 yrs ago  . CARPELL TUNNELL SURG Right   . COLONOSCOPY    . ESOPHAGOGASTRODUODENOSCOPY    . EYE SURGERY     bilateral cataract surgery with lens implants  . KNEE ARTHROSCOPY W/ MENISCAL REPAIR     left  . LUNG BIOPSY Right 11/20/2014   Procedure:  LUNG BIOPSY;  Surgeon: SMelrose Nakayama MD;  [facility]  Service: Thoracic;  Laterality: Right;  . REVERSE SHOULDER ARTHROPLASTY Right 06/07/2018   Procedure: REVERSE SHOULDER ARTHROPLASTY;  Surgeon: SJustice Britain MD;  [facility];  Service: Orthopedics;  Laterality: Right;  1243m  . TOTAL KNEE ARTHROPLASTY Left 08/13/2013   Procedure: LEFT TOTAL KNEE ARTHROPLASTY;  Surgeon: MaMauri PoleMD;  [facility];  Service: Orthopedics;  Laterality: Left;  . UMBILICAL HERNIA REPAIR    . VIDEO ASSISTED THORACOSCOPY Right 11/20/2014   Procedure: RIGHT  VIDEO ASSISTED THORACOSCOPY WITH WEDGE LUNG BIOPSIES RIGHT UPPER,MIDDLE AND LOWER LOBES ,PLACEMENT OF ON Q PAIN PUMP;  Surgeon: StMelrose NakayamaMD;  [facility] Service: Thoracic;  Laterality: Right;    There were no vitals filed for this visit.  Subjective Assessment - 08/17/18 0725    Subjective  Pt states she has general soreness today. No shoulder pain, and she feels that she is making good progress.     Currently in Pain?  No/denies                       [facility]/Exercise - 08/16/18 0001      Shoulder  Exercises: Seated   Flexion  Strengthening;10 reps;Both;Weights    Flexion Weight (lbs)  1    Flexion Limitations   scaption AROM on Rt 2x10 reps 1# added     Other Seated Exercises  BUE pressdown into mat table 10x5 sec       Shoulder Exercises: Sidelying   External Rotation  Strengthening;Right;10 reps    External Rotation Weight (lbs)  2    External Rotation Limitations  x2 sets     ABduction  Strengthening;10 reps;Right;Weights    ABduction Weight (lbs)  2    ABduction Limitations  x2 sets       Shoulder Exercises: Standing   ABduction Limitations  finger ladder in scapular plane x10 reps L20     Other Standing Exercises  RUE cone stack flexion/extension motion 3x5 reps     Other Standing Exercises  BUE extension with yellow TB x15 reps              PT Education  - 08/17/18 0726    Education Details  updated and reviewed HEP progressions    Person(s) Educated  Patient    Methods  Explanation;Demonstration;Verbal cues;Handout    Comprehension  Verbalized understanding;Returned demonstration       PT Short Term Goals - 08/16/18 1205      PT SHORT TERM GOAL #1   Title  Pt will demo consistency and independence with her initial HEP to gradually improve shoulder ROM.    Time  6    Period  Weeks    Status  Achieved    Target Date  08/10/18      PT SHORT TERM GOAL #2   Title  Pt will have atleast 90 deg active Rt shoulder flexion, 60 deg Rt shoulder abduction to allow for safe progression of strength.    Time  6    Period  Weeks    Status  Achieved        PT Long Term Goals - 08/16/18 1205      PT LONG TERM GOAL #1   Title  Pt will have atleast 110 deg active Rt shoulder flexion, 90 deg Rt shoulder abduction to allow for safe progression of strength.    Baseline  flexion 120 degrees, abduction 75    Time  10    Period  Weeks    Status  On-going      PT LONG TERM GOAL #2   Title  Pt will have alteast 3+5 MMT strength of RUE to allow her to reach into her cabinet without difficulty.     Time  10    Period  Weeks    Status  On-going      PT LONG TERM GOAL #3   Title  Pt will report RUE functional use with no more than 2/10 pain throughout the day.    Baseline  No pain with use    Status  Achieved      PT LONG TERM GOAL #4   Title  Pt's FOTO score will decrease to no greater than 35% limitation to reflect improvements in RUE use.     Baseline  41% limitation    Time  10    Period  Weeks    Status  On-going      PT LONG TERM GOAL #5   Title  Pt will report atleast 60% improvement in Rt shoulder pain and use throughout the week.    Status  Achieved  Plan - 08/16/18 1209    Clinical Impression Statement  Continued this session with therex to promote shoulder strength and endurance. Pt was able to progress to 2#  dumbbells for sidelying abduction compared to her last session. Pt's primary complaints is her inability to buckle hear seatbelt or reach behind her for objects. Gentle active ROM into shoulder extension was introduced without increase in pain and scapular strength was progressed as well. Although she continues to benefit from skilled PT to progress shoulder strength, endurance and functional use, she is unsure how much longer she will be able to continue PT due to growing health concerns in the community. We will monitor the situation moving forward.     Rehab Potential  Good    PT Frequency  2x / week    PT Duration  --   10 weeks   PT Treatment/Interventions  ADLs/Self Care Home Management;Moist Heat;Cryotherapy;Therapeutic activities;Therapeutic exercise;Neuromuscular re-education;Patient/family education;Manual techniques;Passive range of motion;Dry needling;Taping    PT Next Visit Plan   gradually progress resistance; scap strengthening.  follow protocol      PT Home Exercise Plan  Access Code: [email or web address]     Consulted and Agree with Plan of Care  Patient       Patient will benefit from skilled therapeutic intervention in order to improve the following deficits and impairments:  Decreased endurance, Decreased mobility, Increased muscle spasms, Hypomobility, Postural dysfunction, Pain, Impaired UE functional use, Decreased strength, Decreased range of motion, Improper body mechanics  Visit Diagnosis: Acute pain of right shoulder  Stiffness of right shoulder, not elsewhere classified  Muscle weakness (generalized)     Problem List Patient Active Problem List   Diagnosis Date Noted  . 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 Chronic respiratory failure with hypoxia ([place]) 07/14/2017  . Diastolic CHF ([place]) 40/34/7425  . Pulmonary hypertension ([place]) 07/06/2016  . Physical deconditioning 07/06/2016  . Pulmonary fibrosis ([place])  06/13/2016  . Acute on chronic respiratory failure with hypoxia ([place]) 06/13/2016  . Hypersensitivity pneumonitis ([place]) 06/13/2016  . Rheumatoid arthritis ([place]) 06/12/2015  . Acute bronchitis 12/16/2014  . 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 Bronchiectasis without acute exacerbation ([place]) 06/30/2014  . Recurrent acute sinusitis 06/17/2014  . Interstitial lung disease ([place]) 06/17/2014  . Obese 08/14/2013  . 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 Obesity 12/17/2010  . HTN (hypertension) 12/17/2010  . LOW BACK PAIN SYNDROME 07/09/2007  . DYSPNEA 07/09/2007  . HYPERLIPIDEMIA 04/03/2007  . GERD 04/03/2007    7:32 AM,08/17/18 Sherol Dade PT, DPT [place] at [place] PHYSICAL THERAPY DISCHARGE SUMMARY  Visits from Start of Care: 11 Current functional level related to goals / functional outcomes: Pt was placed on hold during [place] shut down and has been doing exercises for Rt shoulder.  PT was in contact with pt during this time.  Pt will continue with HEP and will be discharged at this time.     Remaining deficits: See above for most current status.   Education / Equipment: HEP Plan: Patient agrees to discharge.  Patient goals were partially met. Patient is being discharged due to the patient's request.  ?????        Sigurd Sos, PT 10/03/18 11:44 AM  [facility] [address], [place], [place], [address] Phone: [phone]   Fax:  [phone]  Name: [name] MRN: [id number] Date of Birth: July 17, 1940

## 2018-08-17 NOTE — Telephone Encounter (Signed)
Medication name and strength: Ofev 145m Provider: Dr MLake BellsPharmacy: [facility]   Was the PA started on CMM?  yes If yes, please enter the Key: [id number] for approval/denial: 3 business days   [place] [id number] Need help? Call u[place]at [phone] Status Pending Sent to Plan today 08/17/18 Drug Ofev 150MG capsules Form [employer] [place] [employer] Part D General Authorization Form

## 2018-08-20 NOTE — Telephone Encounter (Signed)
PA was started 08/17/2018 by Salome Arnt, LPN for the [facility].  Pt's OFEV has had favorable outcome:  Approved Effective from 08/17/2018 through 08/17/2019.  Nothing further needed.

## 2018-08-20 NOTE — Telephone Encounter (Signed)
Medicine has been approved through covermymeds from 08/17/2018-08/17/2019. Nothing further needed.

## 2018-08-21 ENCOUNTER — Ambulatory Visit: Payer: Medicare Other

## 2018-08-23 ENCOUNTER — Ambulatory Visit: Payer: Medicare Other

## 2018-08-24 ENCOUNTER — Telehealth: Payer: Self-pay | Admitting: Pulmonary Disease

## 2018-08-24 NOTE — Telephone Encounter (Signed)
Call returned to patient, she states she has received her Ofev and she wanted to be sure to let BQ know that she was starting it. Made patient aware we would let BQ know.   BQ just FYI.

## 2018-08-27 NOTE — Telephone Encounter (Signed)
noted 

## 2018-09-12 ENCOUNTER — Telehealth: Payer: Self-pay | Admitting: Pulmonary Disease

## 2018-09-12 NOTE — Telephone Encounter (Signed)
Attempted to call pt x3 to get more info from her in regards to what copay relief was needing as pt has already started on OFEV but unable to reach her and unable to leave a VM. Will try to call back later.

## 2018-09-13 NOTE — Telephone Encounter (Signed)
This is a duplicate encounter as pt has already been started on the OFEV and I spoke with pt yesterday in regards to different grants and [employer]. Closing encounter.

## 2018-09-18 ENCOUNTER — Telehealth: Payer: Self-pay | Admitting: Pulmonary Disease

## 2018-09-18 NOTE — Telephone Encounter (Signed)
I have not received any paperwork or calls in regards to missing information for her Ofev. I will call [employer] this afternoon to see if they are missing anything.

## 2018-09-18 NOTE — Telephone Encounter (Signed)
Called and spoke with pt in regards to the OFEV medication. Pt stated she received a call from [facility] stating that they were needing some info from office and stated that the [place] was supposed to be sending paperwork to our office of info that they were needing on pt.   Stated to pt that we did a PA that was approved and stated to her that the PA is something that always needs to be done before they will even be able to ship the medication to her and pt stated that she has already received a shipment but she thought that some more info was still needed.  Cherina, please advise if you have received any paperwork from [facility] in regards to pt. Thanks!

## 2018-09-19 NOTE — Telephone Encounter (Signed)
Called [employer]. They stated that they did not have anything on file for the patient. If she did receive a call, it would be from the specialty pharmacy or the manufacturer assistance program. Will continue to try to track down who called patient.   Spoke with Manuela Schwartz at [employer]. Per their records, patient received a shipment of medication on March 20th. [facility] reached out to the patient on April 2nd to see how she was doing. Per their notes, patient had not started taking Ofev yet because she was still uneasy about the side effects. [facility] has everything they need in order to provide service to the patient. This is the last time that anyone from [facility] has reached out to the patient.   Spoke with patient. She stated that the person who called her was from the Copay Assistance program from the [employer] Program. The number she was given was [phone].   Spoke with Jordan at Copay [employer]. She stated that BQ needed to sign a form verifying the patient's diagnosis. Requested that the fax be sent again. I have received the form and faxed it back to them.   Spoke with patient. She is aware that this has been taken care of. Nothing further needed at time of call.

## 2018-09-19 NOTE — Telephone Encounter (Signed)
[name], have you followed up on this yet? Thanks

## 2018-10-03 ENCOUNTER — Telehealth: Payer: Self-pay

## 2018-10-03 NOTE — Telephone Encounter (Signed)
[name] was contacted today regarding temporary reduction of Outpatient Rehabilitation Services due to concerns for community transmission of COVID-19.  Patient identity was verified.  Assessed if patient needed to be seen in person by clinician (recent fall or acute injury that requires hands on assessment and advice, change in diet order, post-surgical, special cases, etc.).    Patient did not have an acute/special need that requires in person visit. Proceeded with phone call.  Therapist advised the patient to continue to perform his/her HEP and assured he/she had no unanswered questions or concerns at this time.   The patient is doing well with HEP and is comfortable with D/C at this time.     Patient is aware we can be reached by telephone during business hours if needed.  [phone]

## 2018-11-13 ENCOUNTER — Telehealth: Payer: Self-pay

## 2018-11-13 ENCOUNTER — Other Ambulatory Visit: Payer: Self-pay

## 2018-11-13 ENCOUNTER — Encounter: Payer: Self-pay | Admitting: Adult Health

## 2018-11-13 ENCOUNTER — Ambulatory Visit (INDEPENDENT_AMBULATORY_CARE_PROVIDER_SITE_OTHER): Payer: Medicare Other | Admitting: Adult Health

## 2018-11-13 ENCOUNTER — Ambulatory Visit (INDEPENDENT_AMBULATORY_CARE_PROVIDER_SITE_OTHER): Payer: Medicare Other

## 2018-11-13 VITALS — BP 156/78 | HR 91 | Temp 98.6°F | Ht 67.0 in | Wt 217.4 lb

## 2018-11-13 DIAGNOSIS — J9611 Chronic respiratory failure with hypoxia: Secondary | ICD-10-CM

## 2018-11-13 DIAGNOSIS — J849 Interstitial pulmonary disease, unspecified: Secondary | ICD-10-CM | POA: Diagnosis not present

## 2018-11-13 DIAGNOSIS — J479 Bronchiectasis, uncomplicated: Secondary | ICD-10-CM | POA: Diagnosis not present

## 2018-11-13 DIAGNOSIS — J8410 Pulmonary fibrosis, unspecified: Secondary | ICD-10-CM | POA: Diagnosis not present

## 2018-11-13 DIAGNOSIS — R0602 Shortness of breath: Secondary | ICD-10-CM

## 2018-11-13 MED ORDER — AMOXICILLIN-POT CLAVULANATE 875-125 MG PO TABS: 1.0000 | ORAL_TABLET | Freq: Two times a day (BID) | ORAL | 0 refills | Status: AC

## 2018-11-13 MED ORDER — ALBUTEROL SULFATE HFA 108 (90 BASE) MCG/ACT IN AERS: 2.0000 | INHALATION_SPRAY | Freq: Four times a day (QID) | RESPIRATORY_TRACT | 2 refills | Status: DC | PRN

## 2018-11-13 NOTE — Assessment & Plan Note (Signed)
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   Plan Patient Instructions  Bronchiectasis: Augmentin 864m Twice daily  For 7 days , take with food.  Use hypertonic saline twice a day to help clear mucus out of your lungs Use albuterol twice a day or as needed for shortness of breath Flutter valve As needed   Chest xray today .  COVID testing .   Chronic hypersensitivity pneumonitis: Start taking nintedanib 150 mg twice a day (begin in 1 week , after you finish antibiotic)  When you return in 4 to 6 weeks we will check your blood to make sure that there is no evidence of liver toxicity from this medicine (very rare) We will plan on checking a lung function test later in the year  Chronic respiratory failure with hypoxemia: Keep using 3 L of oxygen continuously Activity as tolerated.   Follow up with Dr. MLake Bellsor Parrett NP in 4-6 weeks and As needed

## 2018-11-13 NOTE — Assessment & Plan Note (Signed)
Cont on O2

## 2018-11-13 NOTE — Progress Notes (Signed)
Reviewed, agree 

## 2018-11-13 NOTE — Assessment & Plan Note (Signed)
Flare  Check cxr and COVID 19 testing   Plan  . Patient Instructions  Bronchiectasis: Augmentin 838m Twice daily  For 7 days , take with food.  Use hypertonic saline twice a day to help clear mucus out of your lungs Use albuterol twice a day or as needed for shortness of breath Flutter valve As needed   Chest xray today .  COVID testing .   Chronic hypersensitivity pneumonitis: Start taking nintedanib 150 mg twice a day (begin in 1 week , after you finish antibiotic)  When you return in 4 to 6 weeks we will check your blood to make sure that there is no evidence of liver toxicity from this medicine (very rare) We will plan on checking a lung function test later in the year  Chronic respiratory failure with hypoxemia: Keep using 3 L of oxygen continuously Activity as tolerated.   Follow up with Dr. MLake Bellsor Parrett NP in 4-6 weeks and As needed

## 2018-11-13 NOTE — Patient Instructions (Addendum)
Bronchiectasis: Augmentin 871m Twice daily  For 7 days , take with food.  Use hypertonic saline twice a day to help clear mucus out of your lungs Use albuterol twice a day or as needed for shortness of breath Flutter valve As needed   Chest xray today .  COVID testing .   Chronic hypersensitivity pneumonitis: Start taking nintedanib 150 mg twice a day (begin in 1 week , after you finish antibiotic)  When you return in 4 to 6 weeks we will check your blood to make sure that there is no evidence of liver toxicity from this medicine (very rare) We will plan on checking a lung function test later in the year  Chronic respiratory failure with hypoxemia: Keep using 3 L of oxygen continuously Activity as tolerated.   Follow up with Dr. MLake Bellsor Parrett NP in 4-6 weeks and As needed

## 2018-11-13 NOTE — Progress Notes (Signed)
_0  ID: [name], female    DOB: [birth year], 78 y.o.   MRN: [id number]  Chief Complaint  Patient presents with  . Follow-up    ILD     Referring provider: Lawerance Cruel, MD  HPI: 78 yo female never smoker followed for interstitial pneumonitis (Lung bx 11/2014) . 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  PFT's January 2016: FVC 2.13 (66%), no obstruction, TLC 4.00 (72%), DLCO 13.70 (48%) March 2017 pulmonary function testing ratio 88%, FVC 2.03 L (84% predicted), total lung capacity 3.0 L (54% predicted), DLCO 14.7 (51% predicted) April 2018 ratio 89%, FVC 1.91 L 61% predicted, total lung capacity 3.49 L 63% predicted, DLCO 12.69 44% predicted November 2018 pulmonary function testing ratio 90%, FVC 1.72 L 55% predicted, total lung capacity 3.37 L 61% predicted, DLCO 11.2 39% predicted  6MW 02/2015 6 Min Walk: 1218 feet. The patient's lowest oxygen saturation was 90 %, highest heart rate was 118 bpm , and highest blood pressure was 180/80 07/2016 6 MW> 1320 feet 3 L O2 88%  Chest imaging: CT chest 2008: Mild bronchiectasis, ?NSIP: CXR 05/2014: Progressive IS changes compared to 2012. HRCT 04/2017: traction bronchiectasis and bronchovascular and peripheral fibrotic changes, worse in the upper lobes, some air trapping, no significant progression compared to 09/2016  HRCT 2016: Changes c/w fibrotic NSIP? 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  Patient says that her cough and congestion have been worse over the last week.  She has had increased congestion with thick yellow mucus intermittently. She denies any fever hemoptysis or known sick contacts.   She has ILD, presumed chronic hypersensitivity pneumonitis.  Patient says she gets short of breath with minimum activity.  Says she is very sedentary.  Last visit she was recommended to start on O FEV but unfortunately has not started this.  She has been approved and has her medication at home but wanted to discuss it at our office today.  We went over the indication for the O FEV and potential side effects.  Patient continues on hypertonic nebs twice daily and flutter valve.  She remains on 3 L of oxygen does turn up to 4 L with activities occasionally.   Allergies  Allergen Reactions  . Adhesive [Tape] Itching    Rash  . Latex Itching and Swelling  . Doxycycline Rash  . Sulfonamide Derivatives Other (See Comments)    Unknown    Immunization History  Administered Date(s) Administered  . Influenza, High Dose Seasonal PF 04/20/2016, 02/23/2017, 02/12/2018  . Influenza-Unspecified 03/06/2014, 02/18/2015  . Pneumococcal Conjugate-13 03/06/2014  . Tdap 06/14/2017    Past Medical History:  Diagnosis Date  . Allergic rhinitis    takes Claritin nightly  . Anxiety    but not on any meds  . Arthritis   . Asthma   . Carpal tunnel syndrome   . CHF (  congestive heart failure) ([place])    patient denies-in office note 05/2015 by Aleene Davidson  . GERD (gastroesophageal reflux disease)   . History of bronchitis 3-88yr ago  . History of colon polyps    benign  . Joint pain   . Peripheral edema    takes Lasix daily  . Pneumonitis, hypersensitivity ([place] 05/2016   chronic hypersensitivity pneumonitis- Dr. MSpero Curb . Pulmonary hypertension  ([place] 06/30/2017   patient denies-in office note from Dr. MSpero Curb  . Shortness of breath dyspnea    with exertion;Albuterol inhaler as needed  . Thyroid nodule     Tobacco History: Social History   Tobacco Use  Smoking Status Never Smoker  Smokeless Tobacco Never Used   Counseling given: Not Answered   Outpatient Medications Prior to Visit  Medication Sig Dispense Refill  . acetaminophen (TYLENOL) 650 MG CR tablet Take 650-1,300 mg by mouth every 8 (eight) hours as needed for pain.    .Marland Kitchenaspirin 325 MG tablet Take 650 mg by mouth every 6 (six) hours as needed for mild pain or moderate pain.     . fluticasone (FLONASE) 50 MCG/ACT nasal spray Place 1 spray into both nostrils daily as needed for allergies.     . furosemide (LASIX) 20 MG tablet Take 20 mg by mouth daily as needed for edema.     .Marland Kitchenibuprofen (ADVIL,MOTRIN) 200 MG tablet Take 400 mg by mouth every 6 (six) hours as needed for mild pain or moderate pain.     .Marland Kitchenloratadine (CLARITIN) 5 MG chewable tablet Chew 5 mg by mouth daily as needed for allergies.    . Nintedanib (OFEV) 150 MG CAPS Take 150 mg by mouth 2 (two) times daily.    .Marland KitchenRespiratory Therapy Supplies (FLUTTER) DEVI Use as directed. 1 each 0  . sodium chloride HYPERTONIC 3 % nebulizer solution Take by nebulization 2 (two) times daily. (Patient taking differently: Take 4 mLs by nebulization 2 (two) times daily. ) 750 mL 12  . albuterol (PROVENTIL HFA;VENTOLIN HFA) 108 (90 Base) MCG/ACT inhaler Inhale 2 puffs into the lungs every 6 (six) hours as needed for wheezing or shortness of breath. 1 Inhaler 5   No facility-administered medications prior to visit.      Review of Systems:   Constitutional:   No  weight loss, night sweats,  Fevers, chills, ' +fatigue, or  lassitude.  HEENT:   No headaches,  Difficulty swallowing,  Tooth/dental problems, or  Sore throat,                No sneezing, itching, ear ache,  +nasal congestion, post nasal drip,   CV:  No chest  pain,  Orthopnea, PND, swelling in lower extremities, anasarca, dizziness, palpitations, syncope.   GI  No heartburn, indigestion, abdominal pain, nausea, vomiting, diarrhea, change in bowel habits, loss of appetite, bloody stools.   Resp:    No chest wall deformity  Skin: no rash or lesions.  GU: no dysuria, change in color of urine, no urgency or frequency.  No flank pain, no hematuria   MS:  No joint pain or swelling.  No decreased range of motion.  No back pain.    Physical Exam  BP (!) 156/78 (BP Location: Left Arm, Cuff Size: Large)   Pulse 91   Temp 98.6 F (37 C) (Oral)   Ht 5' 7" (1.702 m)   Wt 217 lb 6.4 oz (98.6 kg)   SpO2 96%   BMI 34.05 kg/m  GEN: A/Ox3; pleasant , NAD,elderly , O2    HEENT:  [place]/AT,  EACs-clear, TMs-wnl, NOSE-clear, THROAT-clear, no lesions, no postnasal drip or exudate noted.   NECK:  Supple w/ fair ROM; no JVD; normal carotid impulses w/o bruits; no thyromegaly or nodules palpated; no lymphadenopathy.     RESP bibasilar crackles  . no accessory muscle use, no dullness to percussion  CARD:  RRR, no m/r/g, no peripheral edema, pulses intact, no cyanosis or clubbing.  GI:   Soft & nt; nml bowel sounds; no organomegaly or masses detected.   Musco: Warm bil, no deformities or joint swelling noted.   Neuro: alert, no focal deficits noted.    Skin: Warm, no lesions or rashes    Lab Results:   BMET  BNP  Imaging: Dg Chest 2 View  Result Date: 11/13/2018 CLINICAL DATA:  History of bronchiectasis and pulmonary hypertension. EXAM: CHEST - 2 VIEW COMPARISON:  Single-view of the chest 06/07/2018. PA and lateral chest 07/14/2017. Chest CT 04/09/2018. FINDINGS: Pulmonary fibrosis is not markedly changed in appearance given differences in technique compared to the most recent examination. No new airspace disease or effusion. Heart size is upper normal. No acute or focal bony abnormality. IMPRESSION: No marked change in pulmonary fibrosis.  No  acute disease. Electronically Signed   By: Inge Rise M.D.   On: 11/13/2018 12:08      PFT Results Latest Ref Rng & Units 05/01/2017 09/27/2016 09/03/2015 07/04/2014  FVC-Pre L 1.71 1.86 1.93 2.13  FVC-Predicted Pre % 55 59 61 66  FVC-Post L 1.72 1.91 2.03 2.26  FVC-Predicted Post % 55 61 64 70  Pre FEV1/FVC % % 87 86 87 89  Post FEV1/FCV % % 90 89 88 88  FEV1-Pre L 1.50 1.60 1.68 1.90  FEV1-Predicted Pre % 64 68 70 78  FEV1-Post L 1.56 1.70 1.78 2.00  DLCO UNC% % 39 44 51 48  DLCO COR %Predicted % 70 80 78 79  TLC L 3.37 3.49 - 4.00  TLC % Predicted % 61 63 - 72  RV % Predicted % 63 64 - 58    No results found for: NITRICOXIDE      Assessment & Plan:   Interstitial lung disease ([place]) ILD-chronic HSP Patient is to begin O FEV.   Plan Patient Instructions  Bronchiectasis: Augmentin 845m Twice daily  For 7 days , take with food.  Use hypertonic saline twice a day to help clear mucus out of your lungs Use albuterol twice a day or as needed for shortness of breath Flutter valve As needed   Chest xray today .  COVID testing .   Chronic hypersensitivity pneumonitis: Start taking nintedanib 150 mg twice a day (begin in 1 week , after you finish antibiotic)  When you return in 4 to 6 weeks we will check your blood to make sure that there is no evidence of liver toxicity from this medicine (very rare) We will plan on checking a lung function test later in the year  Chronic respiratory failure with hypoxemia: Keep using 3 L of oxygen continuously Activity as tolerated.   Follow up with Dr. MLake Bellsor Montasia Chisenhall NP in 4-6 weeks and As needed        Chronic respiratory failure with hypoxia ([place] Cont on O2   Bronchiectasis without acute exacerbation Flare  Check cxr and COVID 19 testing   Plan  . Patient Instructions  Bronchiectasis: Augmentin 8775mTwice daily  For 7 days , take with food.  Use hypertonic saline twice a day to help clear mucus out of your  lungs Use albuterol twice a day or as needed for shortness of breath Flutter valve As needed   Chest xray today .  COVID testing .   Chronic hypersensitivity pneumonitis: Start taking nintedanib 150 mg twice a day (begin in 1 week , after you finish antibiotic)  When you return in 4 to 6 weeks we will check your blood to make sure that there is no evidence of liver toxicity from this medicine (very rare) We will plan on checking a lung function test later in the year  Chronic respiratory failure with hypoxemia: Keep using 3 L of oxygen continuously Activity as tolerated.   Follow up with Dr. [place] Bells or Deema Juncaj NP in 4-6 weeks and As needed           Rexene Edison, NP 11/13/2018

## 2018-11-14 ENCOUNTER — Telehealth: Payer: Self-pay

## 2018-11-14 DIAGNOSIS — Z20822 Contact with and (suspected) exposure to covid-19: Secondary | ICD-10-CM

## 2018-11-14 NOTE — Telephone Encounter (Addendum)
Patient called and advised of the request for covid testing, she verbalized understanding. Appointment scheduled for tomorrow, 11/15/18 at 1200 at [facility], advised of location and to wear a mask for everyone in the vehicle, she verbalized understanding. Order placed.   ----- Message from Valerie Salts, [place] sent at 11/14/2018  2:56 PM EDT ----- Please schedule patient for COVID testing. She was seen by Rexene Edison NP yesterday for a follow up. Patient reports that her shortness of breath and cough has gotten worse over the last few weeks.   Thanks!

## 2018-11-15 ENCOUNTER — Other Ambulatory Visit: Payer: Self-pay

## 2018-11-15 DIAGNOSIS — Z20822 Contact with and (suspected) exposure to covid-19: Secondary | ICD-10-CM

## 2018-11-15 DIAGNOSIS — R6889 Other general symptoms and signs: Secondary | ICD-10-CM | POA: Diagnosis not present

## 2018-11-16 LAB — NOVEL CORONAVIRUS, NAA: SARS-CoV-2, NAA: NOT DETECTED

## 2018-11-30 NOTE — Telephone Encounter (Signed)
Nothing needed.

## 2018-12-13 ENCOUNTER — Telehealth: Payer: Self-pay | Admitting: *Deleted

## 2018-12-13 ENCOUNTER — Telehealth: Payer: Self-pay | Admitting: Adult Health

## 2018-12-13 DIAGNOSIS — Z20822 Contact with and (suspected) exposure to covid-19: Secondary | ICD-10-CM

## 2018-12-13 NOTE — Telephone Encounter (Signed)
Yes lets get her tested .  Hold off on In office visit till we get test results back- can move out a couple of days or can do video visit if she needs to be seen sooner  Do we know what kind of exposure she had ?

## 2018-12-13 NOTE — Telephone Encounter (Signed)
Called and spoke to pt. Pt states she came into contact with her son-in-law on 7/2 before this her son-in-law was in [place]. He was not symptomatic and she was inside his home for about an hour. Her son-in-law flew back out to [place] on 7/3 and become sick a few days later and tested positive on 7/7 or 7/8.  Advised pt that the [facility] will call her to schedule test. Pt states she would prefer to keep the in-office visit and is aware to call us back a few days after she is tested to re-schedule her OV.   Will forward to TP as FYI.   Will forward to [facility] to get COVID test scheduled.        Testing referred by Tammy Parrett,NP.   Pt called and scheduled for testing on 12/14/18 at [facility] location. Pt advised to wear a mask and remain in car at scheduled appt time. Understanding verbalized.

## 2018-12-13 NOTE — Telephone Encounter (Addendum)
Called and spoke to pt. Pt states she came into contact with her son-in-law on 7/2 before this her son-in-law was in [place]. He was not symptomatic and she was inside his home for about an hour. Her son-in-law flew back out to [place] on 7/3 and become sick a few days later and tested positive on 7/7 or 7/8.  Advised pt that the [facility] will call her to schedule test. Pt states she would prefer to keep the in-office visit and is aware to call us back a few days after she is tested to re-schedule her OV.   Will forward to TP as FYI.   Will forward to [facility] to get COVID test scheduled.

## 2018-12-13 NOTE — Telephone Encounter (Signed)
Spoke with pt. States that she was exposed to someone who is COVID-19 positive. Pt states that she is not having any symptoms at this time. She has an upcoming appointment with Tammy on 12/17/2018. She is wanting to know if we could possible order a COVID test for her to be on the safe side. Advised her that we could change her OV with TP to a televisit if TP saw fit.  TP - please advise. Thanks.

## 2018-12-13 NOTE — Telephone Encounter (Signed)
Pt scheduled for testing on 12/14/18 at [facility] site.

## 2018-12-14 ENCOUNTER — Other Ambulatory Visit: Payer: Self-pay

## 2018-12-14 DIAGNOSIS — Z20822 Contact with and (suspected) exposure to covid-19: Secondary | ICD-10-CM

## 2018-12-14 DIAGNOSIS — R6889 Other general symptoms and signs: Secondary | ICD-10-CM | POA: Diagnosis not present

## 2018-12-17 ENCOUNTER — Ambulatory Visit: Payer: Medicare Other | Admitting: Adult Health

## 2018-12-17 ENCOUNTER — Telehealth: Payer: Self-pay | Admitting: Adult Health

## 2018-12-17 NOTE — Telephone Encounter (Signed)
Called & spoke w/ pt regarding TP's message below. Pt verbalized understanding with no additional questions. Nothing further needed at this time.

## 2018-12-17 NOTE — Telephone Encounter (Signed)
Unfortunately they are not back yet as soon as they are we will let her know .

## 2018-12-17 NOTE — Telephone Encounter (Signed)
Called and spoke with pt. Pt is wanting to know if the test results have come back from the COVID test she had performed 7/10. Tammy, please advise on this for pt. Thanks!

## 2018-12-19 LAB — NOVEL CORONAVIRUS, NAA: SARS-CoV-2, NAA: NOT DETECTED

## 2018-12-21 ENCOUNTER — Telehealth: Payer: Self-pay | Admitting: Pulmonary Disease

## 2018-12-21 NOTE — Telephone Encounter (Signed)
Pt's test results have come back and the results are negative. Called and spoke with pt letting her know that this had come back negative. Stated to her to still closely monitor her symptoms since she did have the exposure with her daughter and other family that did test positive. Stated to her if she did start to develop any symptoms to call us to let us know. Pt verbalized understanding. Nothing further needed.

## 2019-02-07 ENCOUNTER — Ambulatory Visit (INDEPENDENT_AMBULATORY_CARE_PROVIDER_SITE_OTHER): Payer: Medicare Other | Admitting: Adult Health

## 2019-02-07 ENCOUNTER — Other Ambulatory Visit: Payer: Self-pay

## 2019-02-07 ENCOUNTER — Encounter: Payer: Self-pay | Admitting: Adult Health

## 2019-02-07 VITALS — BP 118/70 | HR 77 | Temp 97.6°F | Ht 67.0 in | Wt 217.8 lb

## 2019-02-07 DIAGNOSIS — Z23 Encounter for immunization: Secondary | ICD-10-CM | POA: Diagnosis not present

## 2019-02-07 DIAGNOSIS — Z5181 Encounter for therapeutic drug level monitoring: Secondary | ICD-10-CM | POA: Diagnosis not present

## 2019-02-07 DIAGNOSIS — J9611 Chronic respiratory failure with hypoxia: Secondary | ICD-10-CM | POA: Diagnosis not present

## 2019-02-07 DIAGNOSIS — J479 Bronchiectasis, uncomplicated: Secondary | ICD-10-CM | POA: Diagnosis not present

## 2019-02-07 DIAGNOSIS — J849 Interstitial pulmonary disease, unspecified: Secondary | ICD-10-CM

## 2019-02-07 NOTE — Progress Notes (Signed)
_0  ID: [name], female    DOB: April 28, 1941, 78 y.o.   MRN: [id number]  Chief Complaint  Patient presents with  . Follow-up    IPF     Referring provider: Lawerance Cruel, MD  HPI: 78 yo female never smoker followed for interstitial pneumonitis (Lung bx 11/2014) . 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  PFT's January 2016: FVC 2.13 (66%), no obstruction, TLC 4.00 (72%), DLCO 13.70 (48%) March 2017 pulmonary function testing ratio 88%, FVC 2.03 L (84% predicted), total lung capacity 3.0 L (54% predicted), DLCO 14.7 (51% predicted) April 2018 ratio 89%, FVC 1.91 L 61% predicted, total lung capacity 3.49 L 63% predicted, DLCO 12.69 44% predicted November 2018 pulmonary function testing ratio 90%, FVC 1.72 L 55% predicted, total lung capacity 3.37 L 61% predicted, DLCO 11.2 39% predicted  6MW 02/2015 6 Min Walk: 1218 feet. The patient's lowest oxygen saturation was 90 %, highest heart rate was 118 bpm , and highest blood pressure was 180/80 07/2016 6 MW> 1320 feet 3 L O2 88%  Chest imaging: CT chest 2008: Mild bronchiectasis, ?NSIP: CXR 05/2014: Progressive IS changes compared to 2012. HRCT 04/2017: traction bronchiectasis and bronchovascular and peripheral fibrotic changes, worse in the upper lobes, some air trapping, no significant progression compared to 09/2016  HRCT 2016: Changes c/w fibrotic NSIP? 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  Patient was seen last visit with a bronchiectatic exacerbation.  She was treated with Augmentin for 1 week.  Patient said cough and congestion did improve.  Her COVID-19 testing was negative.  She says she continues to have a daily cough that has intermittent mucus that is clear to white to yellow at times.  She has no fever.  Shortness of breath appears to be at baseline.  She does get winded with heavy activity.  She was started on O FEV last visit for suspected clinical progression she says she has some difficulty tolerating initially with some nausea and loose stools.  But this seems to have gotten better.  She is working with the specialty pharmacy on side effects.  She remains on oxygen 3 L.  No increased oxygen demands.  No change in activity tolerance.  Although she says she does wear out breath and energy at times.     Allergies  Allergen Reactions  . Adhesive [Tape] Itching    Rash  . Latex Itching and Swelling  . Doxycycline Rash  . Sulfonamide Derivatives Other (See Comments)    Unknown    Immunization History  Administered Date(s) Administered  . Fluad Quad(high Dose 65+) 02/07/2019  . Influenza, High Dose Seasonal PF 04/20/2016, 02/23/2017, 02/12/2018  . Influenza-Unspecified 03/06/2014, 02/18/2015  . Pneumococcal Conjugate-13 03/06/2014  . Tdap 06/14/2017    Past Medical History:  Diagnosis Date  . Allergic rhinitis    takes Claritin nightly  . Anxiety  but not on any meds  . Arthritis   . Asthma   . Carpal tunnel syndrome   . CHF (congestive heart failure) ([place])    patient denies-in office note 05/2015 by Aleene Davidson  . GERD (gastroesophageal reflux disease)   . History of bronchitis 3-56yr ago  . History of colon polyps    benign  . Joint pain   . Peripheral edema    takes Lasix daily  .  [place]   chronic hypersensitivity pneumonitis- Dr. MSpero Curb . [place]monary [place] ([place] 06/[place]/2017   patient denies-in office note from Dr. MSpero Curb  . Shortness of breath dyspnea    with exertion;Albuterol inhaler as needed  . Thyroid nodule     Tobacco History: Social History   Tobacco Use  Smoking Status Never Smoker  Smokeless Tobacco Never Used   Counseling given: Not Answered   Outpatient Medications Prior to Visit  Medication Sig Dispense Refill  . acetaminophen (TYLENOL) 650 MG CR tablet Take 650-1,300 mg by mouth every 8 (eight) hours as needed for pain.    .Marland Kitchenalbuterol (VENTOLIN HFA) 108 (90 Base) MCG/ACT inhaler Inhale 2 puffs into the lungs every 6 (six) hours as needed for wheezing or shortness of breath. 1 Inhaler 2  . aspirin 325 MG tablet Take 650 mg by mouth every 6 (six) hours as needed for mild pain or moderate pain.     . fluticasone (FLONASE) 50 MCG/ACT nasal spray Place 1 spray into both nostrils daily as needed for allergies.     . furosemide (LASIX) 20 MG tablet Take 20 mg by mouth daily as needed for edema.     .Marland Kitchenloratadine (CLARITIN) 5 MG chewable tablet Chew 5 mg by mouth daily as needed for allergies.    . Nintedanib (OFEV) 150 MG CAPS Take 150 mg by mouth 2 (two) times daily.    .Marland KitchenRespiratory Therapy Supplies (FLUTTER) DEVI Use as directed. 1 each 0  . sodium chloride HYPERTONIC 3 % nebulizer solution Take by nebulization 2 (two) times daily. (Patient taking differently: Take 4 mLs by nebulization 2 (two) times daily. ) 750 mL 12  . ibuprofen (ADVIL,MOTRIN) 200 MG tablet Take 400 mg by mouth every 6 (six) hours as needed for mild pain or moderate pain.      No facility-administered medications prior to visit.      Review of Systems:   Constitutional:   No  weight loss, night sweats,  Fevers, chills, + fatigue, or  lassitude.  HEENT:   No headaches,  Difficulty swallowing,  Tooth/dental problems, or  Sore  throat,                No sneezing, itching, ear ache, nasal congestion, post nasal drip,   CV:  No chest pain,  Orthopnea, PND, swelling in lower extremities, anasarca, dizziness, palpitations, syncope.   GI  No heartburn, indigestion, abdominal pain, nausea, vomiting, diarrhea, change in bowel habits, loss of appetite, bloody stools.   Resp:    No chest wall deformity  Skin: no rash or lesions.  GU: no dysuria, change in color of urine, no urgency or frequency.  No flank pain, no hematuria   MS:  No joint pain or swelling.  No decreased range of motion.  No back pain.    Physical Exam  BP 118/70 (BP Location: Left Arm, Cuff Size: Normal)   Pulse 77   Temp 97.6 F (36.4 C) (Temporal)   Ht _0  (1.702 m)  Wt 217 lb 12.8 oz (98.8 kg)   SpO2 97%   BMI 34.11 kg/m   GEN: A/Ox3; pleasant , NAD, elderly , obese     HEENT:  [place]/AT,    NOSE-clear, THROAT-clear, no lesions, no postnasal drip or exudate noted.   NECK:  Supple w/ fair ROM; no JVD; normal carotid impulses w/o bruits; no thyromegaly or nodules palpated; no lymphadenopathy.    RESP bibasilar crackles  no accessory muscle use, no dullness to percussion  CARD:  RRR, no m/r/g, no peripheral edema, pulses intact, no cyanosis or clubbing.  GI:   Soft & nt; nml bowel sounds; no organomegaly or masses detected.   Musco: Warm bil, no deformities or joint swelling noted.   Neuro: alert, no focal deficits noted.    Skin: Warm, no lesions or rashes    Lab Results:  CBC zifI BMET   BNP    Component Value Date/Time   BNP 157.3 (H) 06/15/2016 1336    ProBNP    Component Value Date/Time   PROBNP 22.0 04/19/2017 1617    Imaging: No results found.    PFT Results Latest Ref Rng & Units 05/01/2017 09/27/2016 09/03/2015 07/04/2014  FVC-Pre L 1.71 1.86 1.93 2.13  FVC-Predicted Pre % 55 59 61 66  FVC-Post L 1.72 1.91 2.03 2.26  FVC-Predicted Post % 55 61 64 70  Pre FEV1/FVC % % 87 86 87 89  Post FEV1/FCV % %  90 89 88 88  FEV1-Pre L 1.50 1.60 1.68 1.90  FEV1-Predicted Pre % 64 68 70 78  FEV1-Post L 1.56 1.70 1.78 2.00  DLCO UNC% % 39 44 51 48  DLCO COR %Predicted % 70 80 78 79  TLC L 3.37 3.49 - 4.00  TLC % Predicted % 61 63 - 72  RV % Predicted % 63 64 - 58    No results found for: NITRICOXIDE      Assessment & Plan:   Interstitial lung disease (HCC) ILD /Chronic hypersensitivity pneumonitis -clinical progression . OFEV started - seems to be tolerating  Needs LFT today . Check PFT on return   Plan   Patient Instructions  Bronchiectasis: Use hypertonic saline twice a day to help clear mucus out of your lungs Use albuterol twice a day as needed for shortness of breath Mucinex Twice daily  As needed  Cough/congestion  Delsym 2 tsp At bedtime  As needed  Severe coughing  Flutter valve Twice daily     Chronic hypersensitivity pneumonitis: Continue on Nintedanib 150 mg twice a day Follow up with in 2-3 months with Dr. Chase Caller in ILD clinc with PFT   Chronic respiratory failure with hypoxemia: Keep using 3 L of oxygen continuously Activity as tolerated.   Follow up in 2-3 months with Dr. Chase Caller  and As needed  Flu shot today      Bronchiectasis without acute exacerbation Recent flare now resolved Continue aggressive pulmonary hygiene  Plan  Patient Instructions  Bronchiectasis: Use hypertonic saline twice a day to help clear mucus out of your lungs Use albuterol twice a day as needed for shortness of breath Mucinex Twice daily  As needed  Cough/congestion  Delsym 2 tsp At bedtime  As needed  Severe coughing  Flutter valve Twice daily     Chronic hypersensitivity pneumonitis: Continue on Nintedanib 150 mg twice a day Follow up with in 2-3 months with Dr. Chase Caller in ILD clinc with PFT   Chronic respiratory failure with hypoxemia: Keep using 3 L of oxygen  continuously Activity as tolerated.   Follow up in 2-3 months with Dr. Chase Caller  and As needed  Flu  shot today      Chronic respiratory failure with hypoxia ([place]) Continue on oxygen O2 saturation goal greater than 88 to 90%  Plan  Patient Instructions  Bronchiectasis: Use hypertonic saline twice a day to help clear mucus out of your lungs Use albuterol twice a day as needed for shortness of breath Mucinex Twice daily  As needed  Cough/congestion  Delsym 2 tsp At bedtime  As needed  Severe coughing  Flutter valve Twice daily     Chronic hypersensitivity pneumonitis: Continue on Nintedanib 150 mg twice a day Follow up with in 2-3 months with Dr. Chase Caller in ILD clinc with PFT   Chronic respiratory failure with hypoxemia: Keep using 3 L of oxygen continuously Activity as tolerated.   Follow up in 2-3 months with Dr. Chase Caller  and As needed  Flu shot today         Rexene Edison, NP 02/07/2019

## 2019-02-07 NOTE — Assessment & Plan Note (Signed)
ILD /Chronic hypersensitivity pneumonitis -clinical progression . OFEV started - seems to be tolerating  Needs LFT today . Check PFT on return   Plan   Patient Instructions  Bronchiectasis: Use hypertonic saline twice a day to help clear mucus out of your lungs Use albuterol twice a day as needed for shortness of breath Mucinex Twice daily  As needed  Cough/congestion  Delsym 2 tsp At bedtime  As needed  Severe coughing  Flutter valve Twice daily     Chronic hypersensitivity pneumonitis: Continue on Nintedanib 150 mg twice a day Follow up with in 2-3 months with Dr. Chase Caller in ILD clinc with PFT   Chronic respiratory failure with hypoxemia: Keep using 3 L of oxygen continuously Activity as tolerated.   Follow up in 2-3 months with Dr. Chase Caller  and As needed  Flu shot today

## 2019-02-07 NOTE — Patient Instructions (Addendum)
Bronchiectasis: Use hypertonic saline twice a day to help clear mucus out of your lungs Use albuterol twice a day as needed for shortness of breath Mucinex Twice daily  As needed  Cough/congestion  Delsym 2 tsp At bedtime  As needed  Severe coughing  Flutter valve Twice daily     Chronic hypersensitivity pneumonitis: Continue on Nintedanib 150 mg twice a day Follow up with in 2-3 months with Dr. Chase Caller in ILD clinc with PFT   Chronic respiratory failure with hypoxemia: Keep using 3 L of oxygen continuously Activity as tolerated.   Follow up in 2-3 months with Dr. Chase Caller  and As needed  Flu shot today

## 2019-02-07 NOTE — Assessment & Plan Note (Signed)
Recent flare now resolved Continue aggressive pulmonary hygiene  Plan  Patient Instructions  Bronchiectasis: Use hypertonic saline twice a day to help clear mucus out of your lungs Use albuterol twice a day as needed for shortness of breath Mucinex Twice daily  As needed  Cough/congestion  Delsym 2 tsp At bedtime  As needed  Severe coughing  Flutter valve Twice daily     Chronic hypersensitivity pneumonitis: Continue on Nintedanib 150 mg twice a day Follow up with in 2-3 months with Dr. Chase Caller in ILD clinc with PFT   Chronic respiratory failure with hypoxemia: Keep using 3 L of oxygen continuously Activity as tolerated.   Follow up in 2-3 months with Dr. Chase Caller  and As needed  Flu shot today

## 2019-02-07 NOTE — Assessment & Plan Note (Signed)
Continue on oxygen O2 saturation goal greater than 88 to 90%  Plan  Patient Instructions  Bronchiectasis: Use hypertonic saline twice a day to help clear mucus out of your lungs Use albuterol twice a day as needed for shortness of breath Mucinex Twice daily  As needed  Cough/congestion  Delsym 2 tsp At bedtime  As needed  Severe coughing  Flutter valve Twice daily     Chronic hypersensitivity pneumonitis: Continue on Nintedanib 150 mg twice a day Follow up with in 2-3 months with Dr. Chase Caller in ILD clinc with PFT   Chronic respiratory failure with hypoxemia: Keep using 3 L of oxygen continuously Activity as tolerated.   Follow up in 2-3 months with Dr. Chase Caller  and As needed  Flu shot today

## 2019-02-08 LAB — HEPATIC FUNCTION PANEL
ALT: 14 U/L (ref 0–35)
AST: 20 U/L (ref 0–37)
Albumin: 3.7 g/dL (ref 3.5–5.2)
Alkaline Phosphatase: 76 U/L (ref 39–117)
Bilirubin, Direct: 0.1 mg/dL (ref 0.0–0.3)
Total Bilirubin: 0.3 mg/dL (ref 0.2–1.2)
Total Protein: 6.9 g/dL (ref 6.0–8.3)

## 2019-03-21 ENCOUNTER — Telehealth: Payer: Self-pay | Admitting: Adult Health

## 2019-03-21 NOTE — Telephone Encounter (Signed)
Spoke with the pt  She is c/o joint pain and is taking ofev so is aware she can not take tylenol, so she is asking what would be appropriate for her to take  Please advise  Note that she has upcoming appt with TP 03/28/19  Please advise, thanks!

## 2019-03-21 NOTE — Telephone Encounter (Signed)
If she is not allergic or advised not to take NSAIDS could try advil or aleve .  May want to follow up with PCP regarding joint pain as unclear what this is from ? Arthritics/MSK pain .   Please contact office for sooner follow up if symptoms do not improve or worsen or seek emergency care

## 2019-03-21 NOTE — Telephone Encounter (Signed)
Spoke with the pt and notified of recs per Tammy  She verbalized understanding and notified of recs per TP  Nothing further needed

## 2019-03-28 ENCOUNTER — Encounter: Payer: Self-pay | Admitting: Adult Health

## 2019-03-28 ENCOUNTER — Ambulatory Visit (INDEPENDENT_AMBULATORY_CARE_PROVIDER_SITE_OTHER): Payer: Medicare Other | Admitting: Adult Health

## 2019-03-28 ENCOUNTER — Other Ambulatory Visit: Payer: Self-pay

## 2019-03-28 DIAGNOSIS — J9611 Chronic respiratory failure with hypoxia: Secondary | ICD-10-CM

## 2019-03-28 DIAGNOSIS — R5381 Other malaise: Secondary | ICD-10-CM

## 2019-03-28 DIAGNOSIS — J849 Interstitial pulmonary disease, unspecified: Secondary | ICD-10-CM

## 2019-03-28 DIAGNOSIS — J479 Bronchiectasis, uncomplicated: Secondary | ICD-10-CM

## 2019-03-28 NOTE — Progress Notes (Signed)
_0  ID: [name], female    DOB: 1940-07-13, 78 y.o.   MRN: [id number]  Chief Complaint  Patient presents with  . 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  PFT's January 2016: FVC 2.13 (66%), no obstruction, TLC 4.00 (72%), DLCO 13.70 (48%) March 2017 pulmonary function testing ratio 88%, FVC 2.03 L (84% predicted), total lung capacity 3.0 L (54% predicted), DLCO 14.7 (51% predicted) April 2018 ratio 89%, FVC 1.91 L 61% predicted, total lung capacity 3.49 L 63% predicted, DLCO 12.69 44% predicted November 2018 pulmonary function testing ratio 90%, FVC 1.72 L 55% predicted, total lung capacity 3.37 L 61% predicted, DLCO 11.2 39% predicted  6MW 02/2015 6 Min Walk: 1218 feet. The patient's lowest oxygen saturation was 90 %, highest heart rate was 118 bpm , and highest blood pressure was 180/80 07/2016 6 MW> 1320 feet 3 L O2 88%  Chest imaging: CT chest 2008: Mild bronchiectasis, ?NSIP: CXR 05/2014: Progressive IS changes compared to 2012. HRCT 04/2017: traction bronchiectasis and bronchovascular and peripheral fibrotic changes, worse in the upper lobes, some air trapping, no significant progression compared to 09/2016  HRCT 2016: Changes c/w fibrotic NSIP? 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  Patient says since last visit she has been doing okay.  Breathing seems to be getting back to baseline.  Patient had a recent exacerbation couple months ago.  She was treated with antibiotics with improvement.  Patient says she has a daily cough with intermittent congestion.  She remains on O FEV.  Says she is tolerating well with no nausea vomiting or diarrhea.  She remains on oxygen 3 L.  No increased oxygen demands.  Says she is somewhat sedentary has not been as active.  Labs last month showed normal LFTs She remains on hypertonic nebs once or twice a day.  Uses flutter valve couple times a day.  Allergies  Allergen Reactions  . Adhesive [Tape] Itching    Rash  . Latex Itching and Swelling  . Doxycycline Rash  . Sulfonamide Derivatives Other (See Comments)    Unknown    Immunization History  Administered Date(s) Administered  . Fluad Quad(high Dose 65+) 02/07/2019  . Influenza, High Dose Seasonal PF 04/20/2016, 02/23/2017, 02/12/2018  . Influenza-Unspecified 03/06/2014, 02/18/2015  . Pneumococcal Conjugate-13 03/06/2014  . Tdap 06/14/2017    Past Medical History:  Diagnosis Date  . Allergic rhinitis    takes Claritin nightly  . Anxiety    but not on any meds  . Arthritis   . Asthma   . Carpal tunnel syndrome   . CHF (congestive heart failure) ([place])    patient denies-in office note 05/2015 by Aleene Davidson  . GERD (gastroesophageal reflux disease)   .  History of bronchitis 3-81yr ago  . History of colon polyps    benign  . Joint pain   . Peripheral edema    takes Lasix daily  . [place]   chronic hypersensitivity pneumonitis- Dr. MSpero Curb . Pulmonary hypertension ([place] 06/[place]/2017   patient denies-in office note from Dr. MSpero Curb  . Shortness of breath dyspnea    with  exertion;Albuterol inhaler as needed  . Thyroid nodule     Tobacco History: Social History   Tobacco Use  Smoking Status Never Smoker  Smokeless Tobacco Never Used   Counseling given: Not Answered   Outpatient Medications Prior to Visit  Medication Sig Dispense Refill  . acetaminophen (TYLENOL) 650 MG CR tablet Take 650-1,300 mg by mouth every 8 (eight) hours as needed for pain.    .Marland Kitchenalbuterol (VENTOLIN HFA) 108 (90 Base) MCG/ACT inhaler Inhale 2 puffs into the lungs every 6 (six) hours as needed for wheezing or shortness of breath. 1 Inhaler 2  . aspirin 325 MG tablet Take 650 mg by mouth every 6 (six) hours as needed for mild pain or moderate pain.     . fluticasone (FLONASE) 50 MCG/ACT nasal spray Place 1 spray into both nostrils daily as needed for allergies.     . furosemide (LASIX) 20 MG tablet Take 20 mg by mouth daily as needed for edema.     .Marland Kitchenloratadine (CLARITIN) 5 MG chewable tablet Chew 5 mg by mouth daily as needed for allergies.    . Nintedanib (OFEV) 150 MG CAPS Take 150 mg by mouth 2 (two) times daily.    .Marland KitchenRespiratory Therapy Supplies (FLUTTER) DEVI Use as directed. 1 each 0  . sodium chloride HYPERTONIC 3 % nebulizer solution Take by nebulization 2 (two) times daily. (Patient taking differently: Take 4 mLs by nebulization 2 (two) times daily. ) 750 mL 12   No facility-administered medications prior to visit.      Review of Systems:   Constitutional:   No  weight loss, night sweats,  Fevers, chills, fatigue, or  lassitude.  HEENT:   No headaches,  Difficulty swallowing,  Tooth/dental problems, or  Sore throat,                No sneezing, itching, ear ache, nasal congestion, post nasal drip,   CV:  No chest pain,  Orthopnea, PND, swelling in lower extremities, anasarca, dizziness, palpitations, syncope.   GI  No heartburn, indigestion, abdominal pain, nausea, vomiting, diarrhea, change in bowel habits, loss of appetite, bloody stools.   Resp: No shortness of  breath with exertion or at rest.  No excess mucus, no productive cough,  No non-productive cough,  No coughing up of blood.  No change in color of mucus.  No wheezing.  No chest wall deformity  Skin: no rash or lesions.  GU: no dysuria, change in color of urine, no urgency or frequency.  No flank pain, no hematuria   MS:  No joint pain or swelling.  No decreased range of motion.  No back pain.    Physical Exam  BP 126/70 (BP Location: Left Arm, Cuff Size: Large)   Pulse 69   Temp (!) 97.3 F (36.3 C) (Temporal)   Ht _0  (1.702 m)   Wt 219 lb 12.8 oz (99.7 kg)   SpO2 96%   BMI 34.43 kg/m   GEN: A/Ox3; pleasant , NAD, well nourished    HEENT:  [place]/AT,  EACs-clear, TMs-wnl, NOSE-clear, THROAT-clear, no lesions,  no postnasal drip or exudate noted.   NECK:  Supple w/ fair ROM; no JVD; normal carotid impulses w/o bruits; no thyromegaly or nodules palpated; no lymphadenopathy.    RESP  Clear  P & A; w/o, wheezes/ rales/ or rhonchi. no accessory muscle use, no dullness to percussion  CARD:  RRR, no m/r/g, no peripheral edema, pulses intact, no cyanosis or clubbing.  GI:   Soft & nt; nml bowel sounds; no organomegaly or masses detected.   Musco: Warm bil, no deformities or joint swelling noted.   Neuro: alert, no focal deficits noted.    Skin: Warm, no lesions or rashes    Lab Results:  CBC    Component Value Date/Time   WBC 11.4 (H) 05/31/2018 1531   RBC 4.75 05/31/2018 1531   HGB 13.5 05/31/2018 1531   HCT 44.5 05/31/2018 1531   PLT 348 05/31/2018 1531   MCV 93.7 05/31/2018 1531   MCH 28.4 05/31/2018 1531   MCHC 30.3 05/31/2018 1531   RDW 13.8 05/31/2018 1531   LYMPHSABS 3.5 04/19/2017 1617   MONOABS 0.9 04/19/2017 1617   EOSABS 0.7 04/19/2017 1617   BASOSABS 0.1 04/19/2017 1617    BMET    Component Value Date/Time   NA 141 05/31/2018 1531   K 3.8 05/31/2018 1531   CL 102 05/31/2018 1531   CO2 30 05/31/2018 1531   GLUCOSE 89 05/31/2018 1531   BUN 12  05/31/2018 1531   CREATININE 0.52 05/31/2018 1531   CALCIUM 9.1 05/31/2018 1531   GFRNONAA >60 05/31/2018 1531   GFRAA >60 05/31/2018 1531    BNP    Component Value Date/Time   BNP 157.3 (H) 06/15/2016 1336    ProBNP    Component Value Date/Time   PROBNP 22.0 04/19/2017 1617    Imaging: No results found.    PFT Results Latest Ref Rng & Units 05/01/2017 09/27/2016 09/03/2015 07/04/2014  FVC-Pre L 1.71 1.86 1.93 2.13  FVC-Predicted Pre % 55 59 61 66  FVC-Post L 1.72 1.91 2.03 2.26  FVC-Predicted Post % 55 61 64 70  Pre FEV1/FVC % % 87 86 87 89  Post FEV1/FCV % % 90 89 88 88  FEV1-Pre L 1.50 1.60 1.68 1.90  FEV1-Predicted Pre % 64 68 70 78  FEV1-Post L 1.56 1.70 1.78 2.00  DLCO UNC% % 39 44 51 48  DLCO COR %Predicted % 70 80 78 79  TLC L 3.37 3.49 - 4.00  TLC % Predicted % 61 63 - 72  RV % Predicted % 63 64 - 58    No results found for: NITRICOXIDE      Assessment & Plan:   No problem-specific Assessment & Plan notes found for this encounter.     Rexene Edison, NP 03/28/2019

## 2019-03-28 NOTE — Patient Instructions (Addendum)
Bronchiectasis: Use hypertonic saline twice a day to help clear mucus out of your lungs Use albuterol twice a day as needed for shortness of breath Mucinex Twice daily  As needed  Cough/congestion  Delsym 2 tsp At bedtime  As needed  Severe coughing  Flutter valve Twice daily     Chronic hypersensitivity pneumonitis: Continue on Nintedanib 150 mg twice a day Follow up with in 3 months with Dr. Chase Caller in ILD clinc with PFT .   Chronic respiratory failure with hypoxemia: Keep using 3-4  L of oxygen continuously Activity as tolerated.

## 2019-03-29 NOTE — Assessment & Plan Note (Signed)
Currently stable.  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 Chase Caller in ILD clinc with PFT .   Chronic respiratory failure with hypoxemia: Keep using 3-4  L of oxygen continuously Activity as tolerated.

## 2019-03-29 NOTE — Assessment & Plan Note (Signed)
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  Patient is to continue on O FEV Labs last month showed normal LFTs.  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 Chase Caller in ILD clinc with PFT .   Chronic respiratory failure with hypoxemia: Keep using 3-4  L of oxygen continuously Activity as tolerated.

## 2019-03-29 NOTE — Assessment & Plan Note (Signed)
Continue on oxygen to keep O2 saturations greater than 88 to 90% 

## 2019-03-29 NOTE — Assessment & Plan Note (Signed)
Activity as tolerated

## 2019-04-13 ENCOUNTER — Other Ambulatory Visit: Payer: Self-pay | Admitting: Pulmonary Disease

## 2019-04-13 DIAGNOSIS — J479 Bronchiectasis, uncomplicated: Secondary | ICD-10-CM

## 2019-04-15 ENCOUNTER — Telehealth: Payer: Self-pay | Admitting: Adult Health

## 2019-04-15 NOTE — Telephone Encounter (Signed)
Called and spoke to patient. Patient stated that she is having increased productive cough that is yellow. Patient has been having increased sinus drainage and congestion. Patient also reported some wheezing. Patient wanted a telephone visit with Alvira Monday, NP who is familiar with her. Scheduled patient for telephone visit. Nothing further needed at this time.

## 2019-04-16 ENCOUNTER — Other Ambulatory Visit: Payer: Self-pay

## 2019-04-16 ENCOUNTER — Ambulatory Visit (INDEPENDENT_AMBULATORY_CARE_PROVIDER_SITE_OTHER): Payer: Medicare Other | Admitting: Adult Health

## 2019-04-16 ENCOUNTER — Encounter: Payer: Self-pay | Admitting: Adult Health

## 2019-04-16 DIAGNOSIS — J479 Bronchiectasis, uncomplicated: Secondary | ICD-10-CM

## 2019-04-16 MED ORDER — SODIUM CHLORIDE 3 % IN NEBU: 4.0000 mL | INHALATION_SOLUTION | Freq: Two times a day (BID) | RESPIRATORY_TRACT | 5 refills | Status: DC

## 2019-04-16 MED ORDER — AMOXICILLIN-POT CLAVULANATE 875-125 MG PO TABS: 1.0000 | ORAL_TABLET | Freq: Two times a day (BID) | ORAL | 0 refills | Status: AC

## 2019-04-16 NOTE — Patient Instructions (Addendum)
Augmentin 840m Twice daily  For 10 days , take with food.  Eat yogurt daily .   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   Chronic respiratory failure with hypoxemia: Keep using 3 L of oxygen continuously Activity as tolerated.   Follow 6-8 weeks with Dr. RChase Callerand As needed   Please contact office for sooner follow up if symptoms do not improve or worsen or seek emergency care

## 2019-04-16 NOTE — Progress Notes (Signed)
Virtual Visit via Telephone Note  I connected with [name] on 04/16/19 at 11:00 AM EST by telephone and verified that I am speaking with the correct person using two identifiers.  Location: Patient:  Home  Provider: Office    I discussed the limitations, risks, security and privacy concerns of performing an evaluation and management service by telephone and the availability of in person appointments. I also discussed with the patient that there may be a patient responsible charge related to this service. The patient expressed understanding and agreed to proceed.   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 Complains of 1 week of sinus congestion, drainage, yellow discharge and now coughing has picked up. No loss of taste or smell.  No fever. No known sick contacts.  Has not used any over-the-counter medications.  Patient is followed for ILD with suspected chronic hypersensitivity pneumonitis.  She maintains on Ofev.  She also has bronchiectasis.  Is on home oxygen 3 to 4 L.. Says she has had no increased oxygen demands.  Says cough has picked up but feels that it is only starting to get bad.  She denies any chest pain orthopnea PND or increased leg swelling.   Observations/Objective: Imaging: 06/2016 CT angiogram> no PE, superimposed ground glass changes in addition to baseline pulmonary fibrosis.    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  PFT's January 2016: FVC 2.13 (66%), no obstruction, TLC 4.00 (72%), DLCO  13.70 (48%) March 2017 pulmonary function testing ratio 88%, FVC 2.03 L (84% predicted), total lung capacity 3.0 L (54% predicted), DLCO 14.7 (51% predicted) April 2018 ratio 89%, FVC 1.91 L 61% predicted, total lung capacity 3.49 L 63% predicted, DLCO 12.69 44% predicted November 2018 pulmonary function testing ratio 90%, FVC 1.72 L 55% predicted, total lung capacity 3.37 L 61% predicted, DLCO 11.2 39% predicted  6MW 02/2015 6 Min Walk: 1218 feet. The patient's lowest oxygen saturation was 90 %, highest heart rate was 118 bpm , and highest blood pressure was 180/80 07/2016 6 MW> 1320 feet 3 L O2 88%  Chest imaging: CT chest 2008: Mild bronchiectasis, ?NSIP: CXR 05/2014: Progressive IS changes compared to 2012. HRCT 04/2017: traction bronchiectasis and bronchovascular and peripheral fibrotic changes, worse in the upper lobes, some air trapping, no significant progression compared to 09/2016  HRCT 2016: Changes c/w fibrotic NSIP? 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  Eat yogurt daily .   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   Chronic respiratory failure with hypoxemia: Keep using 3 L of oxygen continuously Activity as tolerated.  Follow 6-8 weeks with Dr. Chase Caller and As needed   Please contact office for sooner follow up if symptoms do not improve or worsen or seek emergency care        Follow Up  Instructions: Follow-up in 6 to 8 weeks and as needed  Please contact office for sooner follow up if symptoms do not improve or worsen or seek emergency care     I discussed the assessment and treatment plan with the patient. The patient was provided an opportunity to ask questions and all were answered. The patient agreed with the plan and demonstrated an understanding of the instructions.   The patient was advised to call back or seek an in-person evaluation if the symptoms worsen or if the condition fails to improve as anticipated.  I provided 25 minutes of non-face-to-face time during this encounter.   Rexene Edison, NP

## 2019-04-17 ENCOUNTER — Other Ambulatory Visit: Payer: Self-pay | Admitting: Pulmonary Disease

## 2019-04-17 DIAGNOSIS — J479 Bronchiectasis, uncomplicated: Secondary | ICD-10-CM

## 2019-04-30 DIAGNOSIS — L821 Other seborrheic keratosis: Secondary | ICD-10-CM | POA: Diagnosis not present

## 2019-04-30 DIAGNOSIS — D692 Other nonthrombocytopenic purpura: Secondary | ICD-10-CM | POA: Diagnosis not present

## 2019-04-30 DIAGNOSIS — L570 Actinic keratosis: Secondary | ICD-10-CM | POA: Diagnosis not present

## 2019-04-30 DIAGNOSIS — L814 Other melanin hyperpigmentation: Secondary | ICD-10-CM | POA: Diagnosis not present

## 2019-04-30 DIAGNOSIS — D1801 Hemangioma of skin and subcutaneous tissue: Secondary | ICD-10-CM | POA: Diagnosis not present

## 2019-06-13 ENCOUNTER — Telehealth: Payer: Self-pay | Admitting: Internal Medicine

## 2019-06-13 NOTE — Telephone Encounter (Signed)
Called and spoke with pt letting her know that we are advising our patients to get the vaccine and she verbalized understanding. Nothing further needed.

## 2019-07-04 ENCOUNTER — Ambulatory Visit: Payer: Medicare Other

## 2019-07-08 ENCOUNTER — Telehealth: Payer: Self-pay | Admitting: Adult Health

## 2019-07-08 MED ORDER — OFEV 150 MG PO CAPS: 150.0000 mg | ORAL_CAPSULE | Freq: Two times a day (BID) | ORAL | 5 refills | Status: DC

## 2019-07-08 NOTE — Telephone Encounter (Signed)
OFEV sent to [facility]. [place] further is needed at this time.

## 2019-07-08 NOTE — Telephone Encounter (Signed)
Called [facility], they want patient to start filling Ofev with [facility]. Please send new Ofev prescription to [facility].  Called patient to advise. Provided pharmacy phone number. She will find grant letter to provide information to [facility].  [facility] Phone# [phone]  4:10 PM Beatriz Chancellor, CPhT

## 2019-07-08 NOTE — Telephone Encounter (Signed)
I called and spoke with the patient and she states that she has been getting her OFEV through [facility] and she received a call that it would be changed and shipped to her, but she has not received it. She states that she has not had a change in insurance or anything. Please advise if you can please.

## 2019-07-09 ENCOUNTER — Ambulatory Visit: Payer: Medicare Other

## 2019-07-11 ENCOUNTER — Telehealth: Payer: Self-pay | Admitting: Adult Health

## 2019-07-11 NOTE — Telephone Encounter (Signed)
Amber, can you help [place] out with this please?

## 2019-07-12 ENCOUNTER — Ambulatory Visit: Payer: Medicare Other | Attending: Internal Medicine

## 2019-07-12 DIAGNOSIS — Z23 Encounter for immunization: Secondary | ICD-10-CM

## 2019-07-12 NOTE — Progress Notes (Signed)
   Covid-19 Vaccination Clinic  Name:  DENEKA GREENWALT    MRN: [id number] DOB: Jul 03, 1940  07/12/2019  Ms. Maybee was observed post Covid-19 immunization for 15 minutes without incidence. She was provided with Vaccine Information Sheet and instruction to access the V-Safe system.   Ms. Richoux was instructed to call 911 with any severe reactions post vaccine: Marland Kitchen Difficulty breathing  . Swelling of your face and throat  . A fast heartbeat  . A bad rash all over your body  . Dizziness and weakness    Immunizations Administered    Name Date Dose VIS Date Route   Pfizer COVID-19 Vaccine 07/12/2019  3:09 PM 0.3 mL 05/17/2019 Intramuscular   Manufacturer: [place]   Lot: [id number]   [place]: [id number]

## 2019-07-12 NOTE — Telephone Encounter (Signed)
Called [facility], and provided patient's Assistance Fund information that she was enrolled into. They will process information and contact patient today to schedule shipment. Called patient to advise, she will reach back out if there are any other issues.  BIN- [id number] PCN- AS ID- [id number] Grp- [id number]  [facility]- [phone]  11:24 AM Beatriz Chancellor, CPhT

## 2019-07-16 ENCOUNTER — Other Ambulatory Visit (HOSPITAL_COMMUNITY)
Admission: RE | Admit: 2019-07-16 | Discharge: 2019-07-16 | Disposition: A | Discharge status: Home / Self Care | Payer: Medicare Other | Source: Ambulatory Visit | Attending: Internal Medicine | Admitting: Internal Medicine

## 2019-07-16 DIAGNOSIS — Z01812 Encounter for preprocedural laboratory examination: Secondary | ICD-10-CM | POA: Diagnosis not present

## 2019-07-16 DIAGNOSIS — Z20822 Contact with and (suspected) exposure to covid-19: Secondary | ICD-10-CM | POA: Insufficient documentation

## 2019-07-16 LAB — SARS CORONAVIRUS 2 (TAT 6-24 HRS): SARS Coronavirus 2: NEGATIVE

## 2019-07-18 ENCOUNTER — Other Ambulatory Visit: Payer: Self-pay | Admitting: *Deleted

## 2019-07-18 DIAGNOSIS — J479 Bronchiectasis, uncomplicated: Secondary | ICD-10-CM

## 2019-07-19 ENCOUNTER — Other Ambulatory Visit: Payer: Self-pay

## 2019-07-19 ENCOUNTER — Ambulatory Visit (INDEPENDENT_AMBULATORY_CARE_PROVIDER_SITE_OTHER): Payer: Medicare Other | Admitting: Internal Medicine

## 2019-07-19 ENCOUNTER — Telehealth: Payer: Self-pay | Admitting: Adult Health

## 2019-07-19 DIAGNOSIS — J479 Bronchiectasis, uncomplicated: Secondary | ICD-10-CM

## 2019-07-19 LAB — PULMONARY FUNCTION TEST
DL/VA % pred: 120 %
DL/VA: 4.8 ml/min/mmHg/L
DLCO unc % pred: 82 %
DLCO unc: 17.11 ml/min/mmHg
FEF 25-75 Post: 2.44 L/sec
FEF 25-75 Pre: 1.35 L/sec
FEF2575-%Change-Post: 81 %
FEF2575-%Pred-Post: 148 %
FEF2575-%Pred-Pre: 82 %
FEV1-%Change-Post: 10 %
FEV1-%Pred-Post: 64 %
FEV1-%Pred-Pre: 57 %
FEV1-Post: 1.46 L
FEV1-Pre: 1.31 L
FEV1FVC-%Change-Post: -4 %
FEV1FVC-%Pred-Pre: 110 %
FEV6-%Change-Post: 15 %
FEV6-%Pred-Post: 63 %
FEV6-%Pred-Pre: 55 %
FEV6-Post: 1.84 L
FEV6-Pre: 1.6 L
FEV6FVC-%Change-Post: 0 %
FEV6FVC-%Pred-Post: 104 %
FEV6FVC-%Pred-Pre: 105 %
FVC-%Change-Post: 15 %
FVC-%Pred-Post: 61 %
FVC-%Pred-Pre: 53 %
FVC-Post: 1.87 L
FVC-Pre: 1.61 L
Post FEV1/FVC ratio: 78 %
Post FEV6/FVC ratio: 100 %
Pre FEV1/FVC ratio: 81 %
Pre FEV6/FVC Ratio: 100 %
RV % pred: 73 %
RV: 1.85 L
TLC % pred: 65 %
TLC: 3.63 L

## 2019-07-19 NOTE — Progress Notes (Signed)
Full PFT performed today.

## 2019-07-19 NOTE — Telephone Encounter (Signed)
Error

## 2019-07-29 ENCOUNTER — Other Ambulatory Visit: Payer: Self-pay

## 2019-07-29 ENCOUNTER — Telehealth: Payer: Self-pay | Admitting: Internal Medicine

## 2019-07-29 ENCOUNTER — Encounter: Payer: Self-pay | Admitting: Internal Medicine

## 2019-07-29 ENCOUNTER — Ambulatory Visit (INDEPENDENT_AMBULATORY_CARE_PROVIDER_SITE_OTHER): Payer: Medicare Other | Admitting: Internal Medicine

## 2019-07-29 VITALS — BP 138/64 | HR 80 | Ht 67.0 in | Wt 218.8 lb

## 2019-07-29 DIAGNOSIS — J849 Interstitial pulmonary disease, unspecified: Secondary | ICD-10-CM

## 2019-07-29 DIAGNOSIS — Z5181 Encounter for therapeutic drug level monitoring: Secondary | ICD-10-CM | POA: Diagnosis not present

## 2019-07-29 DIAGNOSIS — J84112 Idiopathic pulmonary fibrosis: Secondary | ICD-10-CM

## 2019-07-29 NOTE — Telephone Encounter (Signed)
Called [facility], medication is shipping from their [place] location and they are experiencing delays due to the weather. Rep provided [employer] tracking number- [id number]. Right now, they shipment is scheduled to deliver tomorrow 2/23, but tracking does not show where shipment has been picked up, so will likely be delayed. Called patient, left detailed message with tracking number.  Phone# [phone]  12:14 PM Beatriz Chancellor, CPhT

## 2019-07-29 NOTE — Telephone Encounter (Signed)
Pt at office for an appt today, 2/22 with MR. While at the visit, pt mentioned that she has been having problems getting her OFEV Rx. Specialty pharmacy was [facility] but then it was switched to [facility]. Pt said she has been on the phone multiple times trying to get a refill of her med. She stated she was told that it was to be delivered but still has not received a refill. Pt said her Rx was supposed to have arrived yesterday 2/21 but did not receive the Rx.  Pt said that she has not completely run out of her med yet but wants to try to stay ahead of the Rx before she runs out of the med. She has about a week left of her current Rx.  Is there any way you might be able to look into this for [place]?

## 2019-07-29 NOTE — Telephone Encounter (Signed)
Amber  Can you look into this stuation please  Thanks  MR

## 2019-07-29 NOTE — Telephone Encounter (Signed)
Called and spoke with pt and provided her the info from [place]. Nothing further needed.

## 2019-07-29 NOTE — Patient Instructions (Addendum)
ICD-10-CM   1. Interstitial lung disease (HCC)  J84.9   2. UIP (usual interstitial pneumonitis) ([place])  J84.112   3. Encounter for therapeutic drug monitoring  Z51.81    Progrssive ILD/Fibrosis - UIP with granuloma  -this is a slowly progressive over time Glad in the last 1 year that you are overall stable with continued 3 L of oxygen use Glad you are tolerating nintedanib fine  Plan   -Do the interstitial lung disease questionnaire version 2.0 -take at home and fill it and drop it off -I will discuss your scan and pathology slides at our multidisciplinary case conference in March or April 2021  -The above 2 steps will ensure that we have consistency with the diagnosis -I have sent a note to [facility] our pharmacist to see what the delay with your nintedanib refill is -Continue oxygen as before -Continue nintedanib as before  -Do liver function test today to ensure there is no liver toxicity from nintedanib  Follow-up -April 2021 and 30-minute slot ILD clinic or any day with Dr. Chase Caller   -Faythe Ghee to bring son inside as a companion for the visit (Dr. Chase Caller has authorized this  -At follow-up we will discuss ILD pro registry protocol, aND patient support group

## 2019-07-29 NOTE — Progress Notes (Signed)
OV 07/29/2019 -transfer of care to Dr. Chase Caller interstitial lung disease center.  Subjective:  Patient ID: [name], female , DOB: 17-Nov-1940 , age 79 y.o. , MRN: [id number] , ADDRESS: [place] [id number]   07/29/2019 -   Chief Complaint  Patient presents with  . Follow-up    Pt states her breathing has become worse since last visit. States she also has an occ cough.   #Interstitial lung disease (progressive) with chronic hypoxemic respiratory failure - chronic HP dx -Per [facility] September 2016: Disease present at least since 08-07-06  - Surgical lung biopsy  November 20, 2014 at [place]:   - local opinion: diagnostic of usual interstitial pneumonia (UIP). Interestingly, there are scattered non-necrotizing granulomata in the pleura.  -Second opinion September 2016 at [facility]: pathology was reviewed at our multidisciplinary ILD conference and noted to have a variegated pattern of fibrosis with some airway centricity, granulomas noted both near the airway and more peripherally. Minimal inflammation was noted. Features not c/w with UIP. Few giant cells with cholesterol clefts. Overall pattern most in keeping with chronic HP  -High-resolution CT chest including April 09, 2018: Lack of craniocaudal gradient and significant air trapping suggestive of chronic hypersensitivity pneumonitis [September 08/07/14 [facility] evaluation thought CT was inconsistent with UIP]  -Clinical diagnosis of chronic hypersensitivity pneumonitis   - dx given Sept 2016 at [place]  -No clear-cut exposure identified at [facility] evaluation September 2016 by Dr. Dorothyann Peng  -Steroids recommended but patient preferred not to take it September 2016 at [place]   - deferred steroids sept 08/07/14 visit at [facility] with Dr Sherri Sear  - nintedanib since March 2020   #Small hiatal hernia with acid reflux disease  HPI [name] 79 y.o. -is a transfer of care from Dr.  Lake Bells to Dr. Chase Caller with interstitial lung disease questionnaire.  She was last seen by Dr. Lake Bells in March 2020.  After the onset of the COVID-19 pandemic Dr. Lake Bells is now fully in charge of the Covid hospital [facility].  Therefore he is not in the outpatient medical practice anymore.  Therefore patient has been transferred to the ILD center to Dr. Chase Caller myself.  Is the first time I am meeting her and getting to know her.  She tells me that I took care of her husband for COPD a few years ago.  This was her ex-husband.  He passed away in August 08, 2011 after hospitalization to the ICU under our service.  She is grateful for the care.  She tells me that she has interstitial lung disease and for the last year she has been on nintedanib.  Currently she is having difficulty with nintedanib supply.  She only has 2 weeks left.  She uses 3 L of oxygen at rest.  She says she has been doing that for the last 4 years.  It is stable usage.  I am not really sure what her pulse ox on room air would be.  She says occasionally she would crank it up to 4 L for exertion but she generally uses 3 L both at rest and with exertion.  She says she tolerates nintedanib fine. Her interstitial lung disease history was reviewed and summarized above.  It appears that she was not keen on taking prednisone and her 2014/08/07 visit with Dr. Dorothyann Peng at [facility].  Therefore once the INBUILD data was available she was started on nintedanib a year  ago.She wants to have a son involved in her visits.  Because of the COVID-19 pandemic he is waiting outside in the car.  She says at the next visit she wants to bring him in.   Her symptom score is listed below.  Her progressive lung function decline is also listed below and the pulmonary function test reviewed.  I personally reviewed and visualized and interpreted those.  Her DLCO is paradoxically higher.  I think this is an error.  SYMPTOM SCALE - ILD 07/29/2019   O2 use 3L at  rest, occ 4L with exertion  Shortness of Breath 0 -> 5 scale with 5 being worst (score 6 If unable to do)  At rest 0  Simple tasks - showers, clothes change, eating, shaving 1  Household (dishes, doing bed, laundry) 2  Shopping 2  Walking level at own pace 1  Walking up Stairs 6  Total (30-36) Dyspnea Score 12  How bad is your cough? 1  How bad is your fatigue 0  How bad is nausea 0  How bad is vomiting?  0  How bad is diarrhea? 0  How bad is anxiety? 1  How bad is depression 1         Results for [name], [name] (MRN [id number]) as of 07/29/2019 11:43  Ref. Range 07/04/2014 12:58 09/03/2015 11:04 09/27/2016 13:56 05/01/2017 14:05 07/19/2019 15:54  FVC-Pre Latest Units: L 2.13 1.93 1.86 1.71 1.61  FVC-%Pred-Pre Latest Units: % 66 61 59 55 53   Results for [name], [name] (MRN [id number]) as of 07/29/2019 11:43  Ref. Range 07/04/2014 12:58 09/03/2015 11:04 09/27/2016 13:56 05/01/2017 14:05 07/19/2019 15:54  DLCO unc Latest Units: ml/min/mmHg 13.70 14.70 12.69 11.23 17.11  DLCO unc % pred Latest Units: % 48 51 44 39 82   ROS - per HPI     has a past medical history of Allergic rhinitis, Anxiety, Arthritis, Asthma, Carpal tunnel syndrome, CHF (congestive heart failure) ([place]), GERD (gastroesophageal reflux disease), History of bronchitis (3-4yr ago), History of colon polyps, Joint pain, Peripheral edema, Pneumonitis, hypersensitivity ([place] (05/2016), Pulmonary hypertension ([place] (06/30/2017), Shortness of breath dyspnea, and Thyroid nodule.   reports that she has never smoked. She has never used smokeless tobacco.  Past Surgical History:  Procedure Laterality Date  . BREAST CYST EXCISION Left 50 yrs ago  . CARPELL TUNNELL SURG Right   . COLONOSCOPY    . ESOPHAGOGASTRODUODENOSCOPY    . EYE SURGERY     bilateral cataract surgery with lens implants  . KNEE ARTHROSCOPY W/ MENISCAL REPAIR     left  . LUNG BIOPSY Right 11/20/2014   Procedure: LUNG BIOPSY;  Surgeon: SMelrose Nakayama MD;  [facility]  Service: Thoracic;  Laterality: Right;  . REVERSE SHOULDER ARTHROPLASTY Right 06/07/2018   Procedure: REVERSE SHOULDER ARTHROPLASTY;  Surgeon: SJustice Britain MD;  Location: [facility];  Service: Orthopedics;  Laterality: Right;  1262m  . TOTAL KNEE ARTHROPLASTY Left 08/13/2013   Procedure: LEFT TOTAL KNEE ARTHROPLASTY;  Surgeon: MaMauri PoleMD;  Location: [facility];  Service: Orthopedics;  Laterality: Left;  . UMBILICAL HERNIA REPAIR    . VIDEO ASSISTED THORACOSCOPY Right 11/20/2014   Procedure: RIGHT  VIDEO ASSISTED THORACOSCOPY WITH WEDGE LUNG BIOPSIES RIGHT UPPER,MIDDLE AND LOWER LOBES ,PLACEMENT OF ON Q PAIN PUMP;  Surgeon: StMelrose NakayamaMD;  [place] [facility] Service: Thoracic;  Laterality: Right;    Allergies  Allergen Reactions  . Adhesive [Tape] Itching    Rash  .  Latex Itching and Swelling  . Doxycycline Rash  . Sulfonamide Derivatives Other (See Comments)    Unknown    Immunization History  Administered Date(s) Administered  . Fluad Quad(high Dose 65+) 02/07/2019  . Influenza, High Dose Seasonal PF 04/20/2016, 02/23/2017, 02/12/2018  . Influenza-Unspecified 03/06/2014, 02/18/2015  . PFIZER SARS-COV-2 Vaccination 07/12/2019  . Pneumococcal Conjugate-13 03/06/2014  . Tdap 06/14/2017    Family History  Problem Relation Age of Onset  . Tuberculosis Father   . Tuberculosis Paternal Uncle      Current Outpatient Medications:  .  acetaminophen (TYLENOL) 650 MG CR tablet, Take 650-1,300 mg by mouth every 8 (eight) hours as needed for pain., Disp: , Rfl:  .  albuterol (VENTOLIN HFA) 108 (90 Base) MCG/ACT inhaler, Inhale 2 puffs into the lungs every 6 (six) hours as needed for wheezing or shortness of breath., Disp: 1 Inhaler, Rfl: 2 .  aspirin 325 MG tablet, Take 650 mg by mouth every 6 (six) hours as needed for mild pain or moderate pain. , Disp: , Rfl:  .  fluticasone (FLONASE) 50 MCG/ACT nasal spray, Place 1 spray into both nostrils  daily as needed for allergies. , Disp: , Rfl:  .  furosemide (LASIX) 20 MG tablet, Take 20 mg by mouth daily as needed for edema. , Disp: , Rfl:  .  loratadine (CLARITIN) 5 MG chewable tablet, Chew 5 mg by mouth daily as needed for allergies., Disp: , Rfl:  .  Nintedanib (OFEV) 150 MG CAPS, Take 1 capsule (150 mg total) by mouth 2 (two) times daily., Disp: 60 capsule, Rfl: 5 .  Respiratory Therapy Supplies (FLUTTER) DEVI, Use as directed., Disp: 1 each, Rfl: 0 .  sodium chloride HYPERTONIC 3 % nebulizer solution, USE CONTENTS OF 1 VIAL IN NEBULIZER 2 TIMES A DAY., Disp: 480 mL, Rfl: 0      Objective:   Vitals:   07/29/19 1130  BP: 138/64  Pulse: 80  SpO2: 96%  Weight: 218 lb 12.8 oz (99.2 kg)  Height: _0  (1.702 m)    Estimated body mass index is 34.27 kg/m as calculated from the following:   Height as of this encounter: _1  (1.702 m).   Weight as of this encounter: 218 lb 12.8 oz (99.2 kg).  _2 @  [employer]   07/29/19 1130  Weight: 218 lb 12.8 oz (99.2 kg)     Physical Exam  Obese o2 on axox3 UL crackles + Clubbing + No edema    STIGMATA of CONNECTIVE TISSUE DISEASE - NONE  - Distal digital fissuring (ie, "mechanic hands") - no - Distal digital tip ulceration - no -Inflammatory arthritis or polyarticular morning joint stiffness ?60 minutes - no - Palmar telangiectasia - no - Raynaud phenomenon - no - Unexplained digital edema - no - Unexplained fixed rash on the digital extensor surfaces (Gottron's sign) - no ... - Deformities of RA - no - Scleroderma  - no - Malar Rash -  No              Assessment:       ICD-10-CM   1. Interstitial lung disease (HCC)  J84.9   2. UIP (usual interstitial pneumonitis) ([place])  J84.112   3. Encounter for therapeutic drug monitoring  Z51.81        Plan:     Patient Instructions     ICD-10-CM   1. Interstitial lung disease (HCC)  J84.9   2. UIP (usual interstitial pneumonitis) ([place])   P10.034  3. Encounter for therapeutic drug monitoring  Z51.81    Progrssive ILD/Fibrosis - UIP with granuloma  -this is a slowly progressive over time Glad in the last 1 year that you are overall stable with continued 3 L of oxygen use Glad you are tolerating nintedanib fine  Plan   -Do the interstitial lung disease questionnaire version 2.0 -take at home and fill it and drop it off -I will discuss your scan and pathology slides at our multidisciplinary case conference in March or April 2021  -The above 2 steps will ensure that we have consistency with the diagnosis -I have sent a note to [employer] our pharmacist to see what the delay with your nintedanib refill is -Continue oxygen as before -Continue nintedanib as before  -Do liver function test today to ensure there is no liver toxicity from nintedanib  Follow-up -April 2021 and 30-minute slot ILD clinic or any day with Dr. Chase Caller   -Faythe Ghee to bring son inside as a companion for the visit (Dr. Chase Caller has authorized this  -At follow-up we will discuss ILD pro registry protocol, aND patient support group     (Level 04: Estb 30-39 min  visit type: on-site physical face to visit visit spent in total care time and counseling or/and coordination of care by this undersigned MD - Dr Brand Males. This includes one or more of the following on this same day 07/29/2019: pre-charting, chart review, note writing, documentation discussion of test results, diagnostic or treatment recommendations, prognosis, risks and benefits of management options, instructions, education, compliance or risk-factor reduction. It excludes time spent by the [place] or office staff in the care of the patient . Actual time is 32 min)   SIGNATURE    Dr. Brand Males, M.D., F.C.C.P,  Pulmonary and Critical Care Medicine Staff Physician, [place] Director - Interstitial Lung Disease  Program  [facility] [place]  at [place], [place], [address]  Pager: [phone], If no answer or between  15:00h - 7:00h: call [phone] Telephone: [phone]  4:30 PM 07/29/2019

## 2019-07-31 NOTE — Telephone Encounter (Signed)
Check [employer] tracking for patient's shipment- Package was delivered on 07/30/19 at 3:18pm.

## 2019-08-06 ENCOUNTER — Ambulatory Visit: Payer: Medicare Other | Attending: Internal Medicine

## 2019-08-06 DIAGNOSIS — Z23 Encounter for immunization: Secondary | ICD-10-CM

## 2019-08-06 NOTE — Progress Notes (Signed)
   Covid-19 Vaccination Clinic  Name:  IFE VITELLI    MRN: [id number] DOB: March 23, 1941  08/06/2019  Ms. Sonntag was observed post Covid-19 immunization for 15 minutes without incident. She was provided with Vaccine Information Sheet and instruction to access the V-Safe system.   Ms. Darnold was instructed to call 911 with any severe reactions post vaccine: Marland Kitchen Difficulty breathing  . Swelling of face and throat  . A fast heartbeat  . A bad rash all over body  . Dizziness and weakness   Immunizations Administered    Name Date Dose VIS Date Route   Pfizer COVID-19 Vaccine 08/06/2019  3:14 PM 0.3 mL 05/17/2019 Intramuscular   Manufacturer: [place]   Lot: [id number]   [place]: [id number]

## 2019-09-18 ENCOUNTER — Telehealth: Payer: Self-pay | Admitting: Internal Medicine

## 2019-09-18 MED ORDER — PREDNISONE 10 MG PO TABS: ORAL_TABLET | ORAL | 0 refills | Status: DC

## 2019-09-18 MED ORDER — CEPHALEXIN 500 MG PO CAPS: 500.0000 mg | ORAL_CAPSULE | Freq: Three times a day (TID) | ORAL | 0 refills | Status: AC

## 2019-09-18 NOTE — Telephone Encounter (Signed)
Thank you for informing us!   Mariella Saa, PharmD, [place], [place] Clinical Specialty Pharmacist [phone]  09/18/2019 4:58 PM

## 2019-09-18 NOTE — Telephone Encounter (Signed)
Received fax request for Ofev Prior Auth from [employer].  Submitted a Prior Authorization request to [facility] for OFEV via Cover My Meds. Will update once we receive a response.  (KeyReesa Chew) - [id number]

## 2019-09-18 NOTE — Telephone Encounter (Signed)
Noted and will route to pharm team to make them aware

## 2019-09-18 NOTE — Telephone Encounter (Signed)
Spoke with patient. She is aware of MR's recs. Medications have been called into the pharmacy for her.   Nothing further needed at time of call.

## 2019-09-18 NOTE — Telephone Encounter (Signed)
For cough abnd yellow sputum  1. IF she has not had covid vaccine - she should get covd tested  2.Please take prednisone 40 mg x1 day, then 30 mg x1 day, then 20 mg x1 day, then 10 mg x1 day, and then 5 mg x1 day and stop  3. Take cephalexin 567m three times daily x  5 days   Allergies  Allergen Reactions  . Adhesive [Tape] Itching    Rash  . Latex Itching and Swelling  . Doxycycline Rash  . Sulfonamide Derivatives Other (See Comments)    Unknown

## 2019-09-18 NOTE — Telephone Encounter (Signed)
[place]. Patient phone number is [phone].

## 2019-09-18 NOTE — Telephone Encounter (Signed)
Spoke with patient. She had 2 reasons for the call.   First reason, she stated that she is having a hard time getting her Ofev from [facility]. She called them earlier today and they advised her that they needed more information from MR. She was under the impression that everything had been taken care of last month. They would not tell her exactly want was needed. I advised her that I would like the pharmacy team know since it looks like they have been handing her Ofev. She verbalized understanding.   Second reason, she stated that she has been coughing more for the past 2 weeks. She thought at first it was related to allergies but the cough is now productive with yellow phlegm. She has also noticed an increase in SOB with the cough. She denied any abnormal body aches, fever or being around anyone with covid. She is requesting an antibiotic. Pharmacy is [facility] on [address].   Pharmacy team, can you all help with her Ofev?   MR, please advise about her cough. Thanks!

## 2019-09-18 NOTE — Telephone Encounter (Signed)
Received notification from [facility] regarding a prior authorization for [place]. Authorization has been APPROVED from 09/18/19 to 09/17/20.   Authorization #[id number]  Called [employer] to advise, tey got a paid claim and will contact patient to schedule shipment. Called patient and advised. I let her know that a nurse will be calling her back in regards to her cough.  3:27 PM [name], CPhT

## 2019-10-03 ENCOUNTER — Encounter: Payer: Self-pay | Admitting: Internal Medicine

## 2019-10-03 ENCOUNTER — Ambulatory Visit (INDEPENDENT_AMBULATORY_CARE_PROVIDER_SITE_OTHER): Payer: Medicare Other | Admitting: Internal Medicine

## 2019-10-03 ENCOUNTER — Other Ambulatory Visit: Payer: Self-pay

## 2019-10-03 VITALS — BP 138/72 | HR 91 | Temp 97.4°F | Ht 66.5 in | Wt 222.6 lb

## 2019-10-03 DIAGNOSIS — J209 Acute bronchitis, unspecified: Secondary | ICD-10-CM

## 2019-10-03 DIAGNOSIS — J849 Interstitial pulmonary disease, unspecified: Secondary | ICD-10-CM | POA: Diagnosis not present

## 2019-10-03 DIAGNOSIS — Z5181 Encounter for therapeutic drug level monitoring: Secondary | ICD-10-CM | POA: Diagnosis not present

## 2019-10-03 DIAGNOSIS — J9611 Chronic respiratory failure with hypoxia: Secondary | ICD-10-CM | POA: Diagnosis not present

## 2019-10-03 MED ORDER — CEPHALEXIN 500 MG PO CAPS: 500.0000 mg | ORAL_CAPSULE | Freq: Three times a day (TID) | ORAL | 0 refills | Status: DC

## 2019-10-03 MED ORDER — PREDNISONE 10 MG PO TABS: ORAL_TABLET | ORAL | 0 refills | Status: DC

## 2019-10-03 NOTE — Patient Instructions (Addendum)
ICD-10-CM   1. Acute bronchitis, unspecified organism  J20.9   2. Interstitial lung disease ([place])  J84.9   3. Encounter for therapeutic drug monitoring  Z51.81   4. Chronic respiratory failure with hypoxia (HCC)  J96.11    Acute bronchitis, unspecified organism  - repeat Please take prednisone 40 mg x1 day, then 30 mg x1 day, then 20 mg x1 day, then 10 mg x1 day, and then 5 mg x1 day and stop - cephalexin 534m three times daily x  5 days   Interstitial lung disease ([place] Encounter for therapeutic drug monitoring Chronic respiratory failure with hypoxia (HCC)   Progrssive ILD/Fibrosis - UIP with granuloma  -this is a slowly progressive over time but stabel since last 2 visit Glad you are tolerating nintedanib fine Not clear to me if this is IPF or HP - baed on ILD questionnaire  - if HP respect deferring prednisone but cellcept is an option  Plan   - will discuss with our local pathologist  -if there is suggestion of HP then will refer to Amber Yopp to get cellcept added on - do HRCT supine and prine after 2 weeks but < 4 weeks   - helps to narrow our differential diagnosis -Continue oxygen as before -Continue nintedanib as before - In future will consider you for research as care option - [facility] BJeffie Pollockor DLoralie Champagne for evaluation of right heart cath or [facility]  - whoever can see you sooner  - if evidence of PAH -then qualifies for TYVASO neb - Glad youi are in rehab - Talk to [email or web address] and join the support group   Follow-up - wioll call with CT result - 2 months do spirometry/dlco - retuyrn to see Dr RChase Callerin 2 months but after completing above  -Timing of repeat CT scan of the chest to be decided at follow-up and based on pathology second opinion consultation.

## 2019-10-03 NOTE — Progress Notes (Signed)
OV 07/29/2019 -transfer of care to Dr. Chase Caller interstitial lung disease center.  Subjective:  Patient ID: [name], female , DOB: [birth year] , age 79 y.o. , MRN: [id number] , ADDRESS: [place] [id number]   07/29/2019 -   Chief Complaint  Patient presents with  . Follow-up    Pt states her breathing has become worse since last visit. States she also has an occ cough.   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 Interestingly, there are scattered non-necrotizing granulomata in the pleura.  -Second opinion September 2016 at [facility]: pathology was reviewed at our multidisciplinary ILD conference and noted to have a variegated pattern of fibrosis with some airway centricity, granulomas noted both near the airway and more peripherally. Minimal inflammation was noted. Features not c/w with UIP. Few giant cells with cholesterol clefts. Overall pattern most in keeping with chronic HP  -High-resolution CT chest including April 09, 2018: Lack of craniocaudal gradient and significant air trapping suggestive of chronic hypersensitivity pneumonitis [September 2014-07-20 [facility] evaluation thought CT was inconsistent with UIP]  -Clinical diagnosis of chronic hypersensitivity pneumonitis   - dx given Sept 2016 at [place]  -No clear-cut exposure identified at [facility] evaluation September 2016 by Dr. Dorothyann Peng  -Steroids recommended but patient preferred not to take it September 2016 at [place]   - deferred steroids sept 07-20-2014 visit at [facility] with Dr Sherri Sear  - nintedanib since March 2020   #Small hiatal hernia with acid reflux disease  HPI [name] 79 y.o. -is a transfer of care from Dr.  Lake Bells to Dr. Chase Caller with interstitial lung disease questionnaire.  She was last seen by Dr. Lake Bells in March 2020.  After the onset of the COVID-19 pandemic Dr. Lake Bells is now fully in charge of the Covid hospital [facility].  Therefore he is not in the outpatient medical practice anymore.  Therefore patient has been transferred to the ILD center to Dr. Chase Caller myself.  Is the first time I am meeting her and getting to know her.  She tells me that I took care of her husband for COPD a few years ago.  This was her ex-husband.  He passed away in Jul 21, 2011 after hospitalization to the ICU under our service.  She is grateful for the care.  She tells me that she has interstitial lung disease and for the last year she has been on nintedanib.  Currently she is having difficulty with nintedanib supply.  She only has 2 weeks left.  She uses 3 L of oxygen at rest.  She says she has been doing that for the last 4 years.  It is stable usage.  I am not really sure what her pulse ox on room air would be.  She says occasionally she would crank it up to 4 L for exertion but she generally uses 3 L both at rest and with exertion.  She says she tolerates nintedanib fine. Her interstitial lung disease history was reviewed and summarized above.  It appears that she was not keen on taking prednisone and her 07/20/14 visit with Dr. Dorothyann Peng at [facility].  Therefore once the INBUILD data was available she was started on nintedanib a year ago.She  wants to have a son involved in her visits.  Because of the COVID-19 pandemic he is waiting outside in the car.  She says at the next visit she wants to bring him in.   Her symptom score is listed below.  Her progressive lung function decline is also listed below and the pulmonary function test reviewed.  I personally reviewed and visualized and interpreted those.  Her DLCO is paradoxically higher.  I think this is an error.   ROS - per HPI  OV  10/03/2019  Subjective:  Patient ID: [name], female , DOB: 1940-06-12 , age 79 y.o. 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 Interestingly, there are scattered non-necrotizing granulomata in the pleura.  -Second opinion September 2016 at [facility]: pathology was reviewed at our multidisciplinary ILD conference and noted to have a variegated pattern of fibrosis with some airway centricity, granulomas noted both near the airway and more peripherally. Minimal inflammation was noted. Features not c/w with UIP. Few giant cells with cholesterol clefts. Overall pattern most in keeping with chronic HP  -High-resolution CT chest including April 09, 2018: Lack of craniocaudal gradient and significant air trapping suggestive of chronic hypersensitivity pneumonitis [September 2016 [facility] evaluation thought CT was inconsistent with UIP]  -Clinical diagnosis of chronic hypersensitivity pneumonitis   - dx given Sept 2016 at [place]  -No clear-cut exposure identified at [facility] evaluation September 2016 by Dr. Dorothyann Peng  -Steroids recommended but patient preferred not to take it September 2016 at [place]   - deferred steroids sept 2016 visit at [facility] with Dr Sherri Sear  - nintedanib since March 2020   #Small hiatal hernia with acid reflux disease  10/03/2019 -   Chief Complaint  Patient presents with  . Follow-up    SOB and cough with activity has increasd, Productive cough with yellow sputum     HPI [name] 79 y.o. -returns for follow-up with her son.  At this point in time the focus is to establish the true nature of interstitial lung  disease.  She has brought her son with her.  However approximately 2 weeks ago she had acute bronchitis symptoms and called in.  Was given a cephalexin 5 days and prednisone 5 days.  These have helped immensely.  However she feels not back to baseline.  She feels she will benefit from another round of antibiotic with or without prednisone.  She finished ILD questionnaire and it is listed below.  We have not been able to discuss in a multidisciplinary case conference of 4. [place] Integrated Comprehensive ILD Questionnaire  Symptoms: See below.    SYMPTOM SCALE - ILD 07/29/2019  10/03/2019   O2 use 3L at rest, occ 4L with exertion 3L at rest and 4L with exertion. Acute bronchitis 2 weeks ago  Shortness of Breath 0 -> 5 scale with 5 being worst (score 6 If unable to do)   At rest 0 1  Simple tasks - showers, clothes change, eating, shaving 1 2  Household (dishes, doing bed, laundry) 2 3  Shopping 2 2  Walking level at own pace 1 2  Walking up Stairs 6 4  Total (30-36) Dyspnea  Score 12 14  How bad is your cough? 1 3.5  How bad is your fatigue 0 3.5  How bad is nausea 0 0  How bad is vomiting?  0 0  How bad is diarrhea? 0 0  How bad is anxiety? 1 1.5  How bad is depression 1 1     Past Medical History :  -Denies any collagen vascular disease or vasculitis.  Denies any knowledge of prior pulmonary hypertension.  Denies diabetes or thyroid disease.  Denies any stroke.  Denies mononucleosis denies tuberculosis denies pneumonia denies blood clots denies heart disease denies pleurisy   ROS: Positive for shoulder pain for the last several years but otherwise no dry eyes.  No Raynaud's.  No weight loss.  No nausea no vomiting no rash   FAMILY HISTORY of LUNG DISEASE: Denies any pulmonary fibrosis or COPD or cystic fibrosis of hypersensitive pneumonitis or any other lung disease.   EXPOSURE HISTORY: Never smoked cigarettes.  No passive smoking.  No marijuana.  No vaping no cocaine no  intravenous drug use.   HOME and HOBBY DETAILS : Current home is 25 years she is lived there for 5 years.  In [place] there is no dampness.  No mold or mildew no feather pillow.  No steam iron use no Jacuzzi use no nebulizer use no humidifier use.  No pet birds or parakeets.  No pet gerbils no pet hamsters no pet rabbits no rodents.  No mold in the [facility] duct no music habits such as wind instruments no gardening.  No flood of water damage.  No strong mats.  No exposure to animals at work.   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  She also had a long-term tendency to clean her home heavily with Clorox.  She inhaled these fumes according to the son.  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   PULMONARY TOXICITY HISTORY (27 items): She has done prednisone burst.  She does not want to do long-term prednisone.  She has been on long-term nintedanib but tolerating it well.    Testing -2018 echo with elevated pulmonary systolic pressure 55 mmHg  - nov 2019 Last CT - HRCT IMPRESSION: 1. Spectrum of findings compatible with fibrotic interstitial lung disease with extensive air trapping and absence of basilar gradient, strongly favoring chronic hypersensitivity pneumonitis. Findings have slightly progressed since 04/24/2017, with more clear progression since more remote chest CT of 06/24/2014. Findings are suggestive of an alternative diagnosis (not UIP) per consensus guidelines: Diagnosis of Idiopathic Pulmonary Fibrosis: An Official ATS/ERS/JRS/ALAT Clinical Practice Guideline. [place], Iss 5, [phone], Feb 04 2017. 2. Stable mild mediastinal lymphadenopathy, most compatible with benign reactive adenopathy. 3. One vessel coronary atherosclerosis. 4.  Small hiatal hernia.  Aortic Atherosclerosis (ICD10-I70.0).       Results for NARA, PATERNOSTER (MRN [id number]) as of 07/29/2019 11:43  Ref. Range 07/04/2014 12:58 09/03/2015 11:04 09/27/2016 13:56 05/01/2017 14:05 07/19/2019 15:54  FVC-Pre Latest Units: L 2.13 1.93 1.86 1.71 1.61  FVC-%Pred-Pre Latest Units: % 66 61 59 55 53   Results for JANEAN, EISCHEN (MRN [id number]) as of 07/29/2019 11:43  Ref. Range 07/04/2014 12:58 09/03/2015 11:04 09/27/2016 13:56 05/01/2017 14:05 07/19/2019 15:54  DLCO unc  Latest Units: ml/min/mmHg 13.70 14.70 12.69 11.23 17.11  DLCO unc % pred Latest Units: % 48 51 44 39 82   ROS - per HPI     has a past medical history of Allergic rhinitis, Anxiety, Arthritis, Asthma, Carpal tunnel syndrome, CHF (congestive heart failure) ([place]), GERD (gastroesophageal reflux disease), History of bronchitis (3-61yr ago), History of colon polyps, Joint pain, Peripheral edema, Pneumonitis, hypersensitivity ([place] (05/[place]), Pulmonary hypertension ([place] (06/30/2017), Shortness of breath dyspnea, and Thyroid nodule.   reports that she has never smoked. She has never used smokeless tobacco.  Past Surgical History:  Procedure Laterality Date  . BREAST CYST EXCISION Left 50 yrs ago  . CARPELL TUNNELL SURG Right   . COLONOSCOPY    . ESOPHAGOGASTRODUODENOSCOPY    . EYE SURGERY     bilateral cataract surgery with lens implants  . KNEE ARTHROSCOPY W/ MENISCAL REPAIR     left  . LUNG BIOPSY Right 11/20/2014   Procedure: LUNG BIOPSY;  Surgeon: SMelrose Nakayama MD;  [facility]  Service: Thoracic;  Laterality: Right;  . REVERSE SHOULDER ARTHROPLASTY Right 06/07/2018   Procedure: REVERSE SHOULDER ARTHROPLASTY;  Surgeon: SJustice Britain MD;  Location: [facility];  Service: Orthopedics;  Laterality: Right;  1295m  . TOTAL KNEE ARTHROPLASTY Left 08/13/2013   Procedure: LEFT TOTAL KNEE ARTHROPLASTY;  Surgeon: MaMauri PoleMD;  Location: [facility];  Service: Orthopedics;  Laterality:  Left;  . UMBILICAL HERNIA REPAIR    . VIDEO ASSISTED THORACOSCOPY Right 11/20/2014   Procedure: RIGHT  VIDEO ASSISTED THORACOSCOPY WITH WEDGE LUNG BIOPSIES RIGHT UPPER,MIDDLE AND LOWER LOBES ,PLACEMENT OF ON Q PAIN PUMP;  Surgeon: StMelrose NakayamaMD;  [facility] Service: Thoracic;  Laterality: Right;    Allergies  Allergen Reactions  . Adhesive [Tape] Itching    Rash  . Latex Itching and Swelling  . Doxycycline Rash  . Sulfonamide Derivatives Other (See Comments)    Unknown    Immunization History  Administered Date(s) Administered  . Fluad Quad(high Dose 65+) 02/07/2019  . Influenza, High Dose Seasonal PF 04/20/2016, 02/23/2017, 02/12/2018  . Influenza-Unspecified 03/06/2014, 02/18/2015  . PFIZER SARS-COV-2 Vaccination 07/12/2019, 08/06/2019  . Pneumococcal Conjugate-13 03/06/2014  . Tdap 06/14/2017    Family History  Problem Relation Age of Onset  . Tuberculosis Father   . Tuberculosis Paternal Uncle      Current Outpatient Medications:  .  acetaminophen (TYLENOL) 650 MG CR tablet, Take 650-1,300 mg by mouth every 8 (eight) hours as needed for pain., Disp: , Rfl:  .  albuterol (VENTOLIN HFA) 108 (90 Base) MCG/ACT inhaler, Inhale 2 puffs into the lungs every 6 (six) hours as needed for wheezing or shortness of breath., Disp: 1 Inhaler, Rfl: 2 .  aspirin 325 MG tablet, Take 650 mg by mouth every 6 (six) hours as needed for mild pain or moderate pain. , Disp: , Rfl:  .  fluticasone (FLONASE) 50 MCG/ACT nasal spray, Place 1 spray into both nostrils daily as needed for allergies. , Disp: , Rfl:  .  furosemide (LASIX) 20 MG tablet, Take 20 mg by mouth daily as needed for edema. , Disp: , Rfl:  .  loratadine (CLARITIN) 5 MG chewable tablet, Chew 5 mg by mouth daily as needed for allergies., Disp: , Rfl:  .  Nintedanib (OFEV) 150 MG CAPS, Take 1 capsule (150 mg total) by mouth 2 (two) times daily., Disp: 60 capsule, Rfl: 5 .  Respiratory Therapy Supplies (FLUTTER) DEVI,  Use as directed.,  Disp: 1 each, Rfl: 0 .  sodium chloride HYPERTONIC 3 % nebulizer solution, USE CONTENTS OF 1 VIAL IN NEBULIZER 2 TIMES A DAY., Disp: 480 mL, Rfl: 0 .  predniSONE (DELTASONE) 10 MG tablet, 29m x1day, 334mx1 day, 2075m1day, 73m48mday and then 1/2 tab on the last day (Patient not taking: Reported on 10/03/2019), Disp: 11 tablet, Rfl: 0      Objective:   Vitals:   10/03/19 1342  BP: 138/72  Pulse: 91  Temp: (!) 97.4 F (36.3 C)  TempSrc: Temporal  SpO2: 96%  Weight: 222 lb 9.6 oz (101 kg)  Height: 5' 6.5" (1.689 m)    Estimated body mass index is 35.39 kg/m as calculated from the following:   Height as of this encounter: 5' 6.5" (1.689 m).   Weight as of this encounter: 222 lb 9.6 oz (101 kg).  _0 @  [employer]4/29/21 1342  Weight: 222 lb 9.6 oz (101 kg)     Physical Exam  General Appearance:    Alert, cooperative, no distress, appears stated age - yes , Deconditioned looking - yes , OBESE  - y3s, Sitting on Wheelchair -  no  Head:    Normocephalic, without obvious abnormality, atraumatic  Eyes:    PERRL, conjunctiva/corneas clear,  Ears:    Normal TM's and external ear canals, both ears  Nose:   Nares normal, septum midline, mucosa normal, no drainage    or sinus tenderness. OXYGEN ON  - yes . Patient is @ 3L   Throat:   Lips, mucosa, and tongue normal; teeth and gums normal. Cyanosis on lips - no  Neck:   Supple, symmetrical, trachea midline, no adenopathy;    thyroid:  no enlargement/tenderness/nodules; no carotid   bruit or JVD  Back:     Symmetric, no curvature, ROM normal, no CVA tenderness  Lungs:     Distress - no , Wheeze no, Barrell Chest - no, Purse lip breathing - no, Crackles - yes scattered and without craniocaudal gradient  Chest Wall:    No tenderness or deformity.    Heart:    Regular rate and rhythm, S1 and S2 normal, no rub   or gallop, Murmur - no  Breast Exam:    NOT DONE  Abdomen:     Soft, non-tender, bowel  sounds active all four quadrants,    no masses, no organomegaly. Visceral obesity - no  Genitalia:   NOT DONE  Rectal:   NOT DONE  Extremities:   Extremities - normal, Has Cane - no, Clubbing - no, Edema - no  Pulses:   2+ and symmetric all extremities  Skin:   Stigmata of Connective Tissue Disease - no  Lymph nodes:   Cervical, supraclavicular, and axillary nodes normal  Psychiatric:  Neurologic:   Pleasant - yes, Anxious - no, Flat affect - no  CAm-ICU - neg, Alert and Oriented x 3 - yes, Moves all 4s - yes, Speech - normal, Cognition - intact           Assessment:       ICD-10-CM   1. Acute bronchitis, unspecified organism  J20.9   2. Interstitial lung disease (HCC)[place]84.9   3. Encounter for therapeutic drug monitoring  Z51.81   4. Chronic respiratory failure with hypoxia (HCC)  J96.11    She currently has acute bronchitis.  She had extensive air trapping in 2019 CT but there is no obvious exposure.  Her pathology has conflicting  reports between UIP at our center and HP at [facility].  There is no obvious exposure.  It appears the needle of suspicion is more towards chronic HP overall.  Nevertheless it be worth having another look.  This is because she has progressive disease and if it is indeed chronic HP she will need immunomodulation.  She does not want to do prednisone.  We discussed CellCept and she is very open to this.  We discussed the benefits risks and limitations of CellCept.  To this and I will have a pathologist review the slide again we will get another high-resolution CT chest.  Currently she has acute bronchitis I want this to settle down so we will do another prednisone and antibiotic.    Plan:     Patient Instructions     ICD-10-CM   1. Acute bronchitis, unspecified organism  J20.9   2. Interstitial lung disease ([place])  J84.9   3. Encounter for therapeutic drug monitoring  Z51.81   4. Chronic respiratory failure with hypoxia (HCC)  J96.11    Acute  bronchitis, unspecified organism  - repeat Please take prednisone 40 mg x1 day, then 30 mg x1 day, then 20 mg x1 day, then 10 mg x1 day, and then 5 mg x1 day and stop - cephalexin 575m three times daily x  5 days   Interstitial lung disease ([place] Encounter for therapeutic drug monitoring Chronic respiratory failure with hypoxia (HCC)   Progrssive ILD/Fibrosis - UIP with granuloma  -this is a slowly progressive over time but stabel since last 2 visit Glad you are tolerating nintedanib fine Not clear to me if this is IPF or HP - baed on ILD questionnaire  - if HP respect deferring prednisone but cellcept is an option  Plan   - will discuss with our local pathologist  -if there is suggestion of HP then will refer to Amber Yopp to get cellcept added on - do HRCT supine and prine after 2 weeks but < 4 weeks   - helps to narrow our differential diagnosis -Continue oxygen as before -Continue nintedanib as before - In future will consider you for research as care option - [facility]Dr BJeffie Pollockor DLoralie Champagne for evaluation of right heart cath or [facility]  - whoever can see you sooner  - if evidence of PAH -then qualifies for TYVASO neb - Glad youi are in rehab - Talk to F[employer] [email or web address] and join the support group   Follow-up - wioll call with CT result - 2 months do spirometry/dlco - retuyrn to see Dr RChase Callerin 2 months but after completing above  -Timing of repeat CT scan of the chest to be decided at follow-up and based on pathology second opinion consultation.   ( Level 05 visit: Estb 40-54 min   in  visit type: on-site physical face to visit  in total care time and counseling or/and coordination of care by this undersigned MD - Dr MBrand Males This includes one or more of the following on this same day 10/03/2019: pre-charting, chart review, note writing, documentation discussion of test results, diagnostic or treatment  recommendations, prognosis, risks and benefits of management options, instructions, education, compliance or risk-factor reduction. It excludes time spent by the [facility] office staff in the care of the patient. Actual time 411min)   SIGNATURE    Dr. MBrand Males M.D., F.C.C.P,  Pulmonary and Critical Care Medicine Staff Physician, [facility]irector - Interstitial  Lung Disease  Program  Pulmonary [place] at [place], [place], [address]  Pager: [phone], If no answer or between  15:00h - 7:00h: call [phone] Telephone: [phone]  2:31 PM 10/03/2019

## 2019-10-04 ENCOUNTER — Telehealth: Payer: Self-pay | Admitting: Internal Medicine

## 2019-10-04 NOTE — Telephone Encounter (Signed)
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 I need the name of the chemical and if it generated dust  Thanks    SIGNATURE    Dr. Brand Males, M.D., F.C.C.P,  Pulmonary and Critical Care Medicine Staff Physician, [place] Director - Interstitial Lung Disease  Program  Pulmonary [place] at [place], [place], [address]  Pager: [phone], If no answer or between  15:00h - 7:00h: call [phone] Telephone: [phone]  9:56 AM 10/04/2019

## 2019-10-04 NOTE — Telephone Encounter (Signed)
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

## 2019-10-07 NOTE — Telephone Encounter (Signed)
Ok thanks 

## 2019-10-24 ENCOUNTER — Other Ambulatory Visit: Payer: Self-pay

## 2019-10-24 ENCOUNTER — Ambulatory Visit (HOSPITAL_COMMUNITY)
Admission: RE | Admit: 2019-10-24 | Discharge: 2019-10-24 | Disposition: A | Discharge status: Home / Self Care | Payer: Medicare Other | Source: Ambulatory Visit | Attending: Internal Medicine | Admitting: Internal Medicine

## 2019-10-24 ENCOUNTER — Encounter (HOSPITAL_COMMUNITY): Payer: Self-pay

## 2019-10-24 DIAGNOSIS — J849 Interstitial pulmonary disease, unspecified: Secondary | ICD-10-CM

## 2019-10-30 NOTE — Progress Notes (Signed)
Let her knw that I discussed her slide path with our pathologist - dx is HP. CT shows no progression since 2019 but yes progression since 2016. Wil discuss at June visit

## 2019-11-01 NOTE — Progress Notes (Signed)
Patient identification verified. Results of recent chest CT reviewed.  Per Dr. Chase Caller, Let her knw that I discussed her slide path with our pathologist - dx is HP. CT shows no progression since 2019 but yes progression since 2016. Wil discuss at June visit.  Message given to patient, she verbalized understanding, next appointment time reviewed.

## 2019-11-29 ENCOUNTER — Other Ambulatory Visit (HOSPITAL_COMMUNITY)
Admission: RE | Admit: 2019-11-29 | Discharge: 2019-11-29 | Disposition: A | Discharge status: Home / Self Care | Payer: Medicare Other | Source: Ambulatory Visit | Attending: Internal Medicine | Admitting: Internal Medicine

## 2019-11-29 DIAGNOSIS — Z01812 Encounter for preprocedural laboratory examination: Secondary | ICD-10-CM | POA: Insufficient documentation

## 2019-11-29 DIAGNOSIS — Z20822 Contact with and (suspected) exposure to covid-19: Secondary | ICD-10-CM | POA: Diagnosis not present

## 2019-11-29 LAB — SARS CORONAVIRUS 2 (TAT 6-24 HRS): SARS Coronavirus 2: NEGATIVE

## 2019-12-03 ENCOUNTER — Ambulatory Visit (INDEPENDENT_AMBULATORY_CARE_PROVIDER_SITE_OTHER): Payer: Medicare Other | Admitting: Internal Medicine

## 2019-12-03 ENCOUNTER — Telehealth (HOSPITAL_COMMUNITY): Payer: Self-pay

## 2019-12-03 ENCOUNTER — Encounter: Payer: Self-pay | Admitting: Internal Medicine

## 2019-12-03 ENCOUNTER — Other Ambulatory Visit: Payer: Self-pay

## 2019-12-03 VITALS — BP 146/84 | HR 76 | Temp 98.5°F | Ht 67.0 in | Wt 217.0 lb

## 2019-12-03 DIAGNOSIS — J849 Interstitial pulmonary disease, unspecified: Secondary | ICD-10-CM | POA: Diagnosis not present

## 2019-12-03 DIAGNOSIS — J9611 Chronic respiratory failure with hypoxia: Secondary | ICD-10-CM

## 2019-12-03 DIAGNOSIS — J479 Bronchiectasis, uncomplicated: Secondary | ICD-10-CM | POA: Diagnosis not present

## 2019-12-03 DIAGNOSIS — J679 Hypersensitivity pneumonitis due to unspecified organic dust: Secondary | ICD-10-CM

## 2019-12-03 DIAGNOSIS — Z5181 Encounter for therapeutic drug level monitoring: Secondary | ICD-10-CM

## 2019-12-03 LAB — PULMONARY FUNCTION TEST
DL/VA % pred: 103 %
DL/VA: 4.14 ml/min/mmHg/L
DLCO cor % pred: 63 %
DLCO cor: 13.2 ml/min/mmHg
DLCO unc % pred: 63 %
DLCO unc: 13.2 ml/min/mmHg
FEF 25-75 Pre: 1.26 L/sec
FEF2575-%Pred-Pre: 79 %
FEV1-%Pred-Pre: 60 %
FEV1-Pre: 1.36 L
FEV1FVC-%Pred-Pre: 110 %
FEV6-%Pred-Pre: 58 %
FEV6-Pre: 1.66 L
FEV6FVC-%Pred-Pre: 105 %
FVC-%Pred-Pre: 55 %
FVC-Pre: 1.66 L
Pre FEV1/FVC ratio: 82 %
Pre FEV6/FVC Ratio: 100 %

## 2019-12-03 LAB — HEPATIC FUNCTION PANEL
ALT: 14 U/L (ref 0–35)
AST: 18 U/L (ref 0–37)
Albumin: 4.1 g/dL (ref 3.5–5.2)
Alkaline Phosphatase: 76 U/L (ref 39–117)
Bilirubin, Direct: 0.1 mg/dL (ref 0.0–0.3)
Total Bilirubin: 0.3 mg/dL (ref 0.2–1.2)
Total Protein: 7.3 g/dL (ref 6.0–8.3)

## 2019-12-03 NOTE — Progress Notes (Signed)
OV 07/29/2019 -transfer of care to Dr. Chase Caller interstitial lung disease center.  Subjective:  Patient ID: [name], female , DOB: [birth year] , age 79 y.o. , MRN: [id number] , ADDRESS: [place] [id number]   07/29/2019 -   Chief Complaint  Patient presents with  . Follow-up    Pt states her breathing has become worse since last visit. States she also has an occ cough.   #Interstitial lung disease (progressive) with chronic hypoxemic respiratory failure - chronic HP dx -Per [facility] September 2016: Disease present at least since July 20, 2006  - Surgical lung biopsy  November 20, 2014 at [place]:   - local opinion: diagnostic of usual interstitial pneumonia (UIP). Interestingly, there are scattered non-necrotizing granulomata in the pleura.  -Second opinion September 2016 at [facility]: pathology was reviewed at our multidisciplinary ILD conference and noted to have a variegated pattern of fibrosis with some airway centricity, granulomas noted both near the airway and more peripherally. Minimal inflammation was noted. Features not c/w with UIP. Few giant cells with cholesterol clefts. Overall pattern most in keeping with chronic HP  -High-resolution CT chest including April 09, 2018: Lack of craniocaudal gradient and significant air trapping suggestive of chronic hypersensitivity pneumonitis [September 2014-07-20 [facility] evaluation thought CT was inconsistent with UIP]  -Clinical diagnosis of chronic hypersensitivity pneumonitis   - dx given Sept 2016 at [place]  -No clear-cut exposure identified at [facility] evaluation September 2016 by Dr. Dorothyann Peng  -Steroids recommended but patient preferred not to take it September 2016 at [place]   - deferred steroids sept 07-20-2014 visit at [facility] with Dr Sherri Sear  - nintedanib since March 2020   #Small hiatal hernia with acid reflux disease  HPI [name] 79 y.o. -is a transfer of care from Dr.  Lake Bells to Dr. Chase Caller with interstitial lung disease questionnaire.  She was last seen by Dr. Lake Bells in March 2020.  After the onset of the COVID-19 pandemic Dr. Lake Bells is now fully in charge of the Covid hospital [facility].  Therefore he is not in the outpatient medical practice anymore.  Therefore patient has been transferred to the ILD center to Dr. Chase Caller myself.  Is the first time I am meeting her and getting to know her.  She tells me that I took care of her husband for COPD a few years ago.  This was her ex-husband.  He passed away in Jul 21, 2011 after hospitalization to the ICU under our service.  She is grateful for the care.  She tells me that she has interstitial lung disease and for the last year she has been on nintedanib.  Currently she is having difficulty with nintedanib supply.  She only has 2 weeks left.  She uses 3 L of oxygen at rest.  She says she has been doing that for the last 4 years.  It is stable usage.  I am not really sure what her pulse ox on room air would be.  She says occasionally she would crank it up to 4 L for exertion but she generally uses 3 L both at rest and with exertion.  She says she tolerates nintedanib fine. Her interstitial lung disease history was reviewed and summarized above.  It appears that she was not keen on taking prednisone and her 07/20/14 visit with Dr. Dorothyann Peng at [facility].  Therefore once the INBUILD data was available she was started on nintedanib a year ago.She  wants to have a son involved in her visits.  Because of the COVID-19 pandemic he is waiting outside in the car.  She says at the next visit she wants to bring him in.   Her symptom score is listed below.  Her progressive lung function decline is also listed below and the pulmonary function test reviewed.  I personally reviewed and visualized and interpreted those.  Her DLCO is paradoxically higher.  I think this is an error.   ROS - per HPI  OV  10/03/2019  Subjective:  Patient ID: [name], female , DOB: 20-Apr-1941 , age 79 y.o. 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 Interestingly, there are scattered non-necrotizing granulomata in the pleura.  -Second opinion September 2016 at [facility]: pathology was reviewed at our multidisciplinary ILD conference and noted to have a variegated pattern of fibrosis with some airway centricity, granulomas noted both near the airway and more peripherally. Minimal inflammation was noted. Features not c/w with UIP. Few giant cells with cholesterol clefts. Overall pattern most in keeping with chronic HP  -High-resolution CT chest including April 09, 2018: Lack of craniocaudal gradient and significant air trapping suggestive of chronic hypersensitivity pneumonitis [September 2016 [facility] evaluation thought CT was inconsistent with UIP]  -Clinical diagnosis of chronic hypersensitivity pneumonitis   - dx given Sept 2016 at [place]  -No clear-cut exposure identified at [facility] evaluation September 2016 by Dr. Dorothyann Peng  -Steroids recommended but patient preferred not to take it September 2016 at [place]   - deferred steroids sept 2016 visit at [facility] with Dr Sherri Sear  - nintedanib since March 2020   #Small hiatal hernia with acid reflux disease  10/03/2019 -   Chief Complaint  Patient presents with  . Follow-up    SOB and cough with activity has increasd, Productive cough with yellow sputum     HPI [name] 79 y.o. -returns for follow-up with her son.  At this point in time the focus is to establish the true nature of interstitial lung  disease.  She has brought her son with her.  However approximately 2 weeks ago she had acute bronchitis symptoms and called in.  Was given a cephalexin 5 days and prednisone 5 days.  These have helped immensely.  However she feels not back to baseline.  She feels she will benefit from another round of antibiotic with or without prednisone.  She finished ILD questionnaire and it is listed below.  We have not been able to discuss in a multidisciplinary case conference of 4. [place] Integrated Comprehensive ILD Questionnaire  Symptoms: See below.    SYMPTOM SCALE - ILD 07/29/2019  10/03/2019   O2 use 3L at rest, occ 4L with exertion 3L at rest and 4L with exertion. Acute bronchitis 2 weeks ago  Shortness of Breath 0 -> 5 scale with 5 being worst (score 6 If unable to do)   At rest 0 1  Simple tasks - showers, clothes change, eating, shaving 1 2  Household (dishes, doing bed, laundry) 2 3  Shopping 2 2  Walking level at own pace 1 2  Walking up Stairs 6 4  Total (30-36) Dyspnea  Score 12 14  How bad is your cough? 1 3.5  How bad is your fatigue 0 3.5  How bad is nausea 0 0  How bad is vomiting?  0 0  How bad is diarrhea? 0 0  How bad is anxiety? 1 1.5  How bad is depression 1 1     Past Medical History :  -Denies any collagen vascular disease or vasculitis.  Denies any knowledge of prior pulmonary hypertension.  Denies diabetes or thyroid disease.  Denies any stroke.  Denies mononucleosis denies tuberculosis denies pneumonia denies blood clots denies heart disease denies pleurisy   ROS: Positive for shoulder pain for the last several years but otherwise no dry eyes.  No Raynaud's.  No weight loss.  No nausea no vomiting no rash   FAMILY HISTORY of LUNG DISEASE: Denies any pulmonary fibrosis or COPD or cystic fibrosis of hypersensitive pneumonitis or any other lung disease.   EXPOSURE HISTORY: Never smoked cigarettes.  No passive smoking.  No marijuana.  No vaping no cocaine no  intravenous drug use.   HOME and HOBBY DETAILS : Current home is 25 years she is lived there for 5 years.  [place] there is no dampness.  No mold or mildew no feather pillow.  No steam iron use no Jacuzzi use no nebulizer use no humidifier use.  No pet birds or parakeets.  No pet gerbils no pet hamsters no pet rabbits no rodents.  No mold in the [facility] duct no music habits such as wind instruments no gardening.  No flood of water damage.  No strong mats.  No exposure to animals at work.   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  She also had a long-term tendency to clean her home heavily with Clorox.  She inhaled these fumes according to the son.  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   PULMONARY TOXICITY HISTORY (27 items): She has done prednisone burst.  She does not want to do long-term prednisone.  She has been on long-term nintedanib but tolerating it well.    Testing -2018 echo with elevated pulmonary systolic pressure 55 mmHg  - nov 2019 Last CT - HRCT IMPRESSION: 1. Spectrum of findings compatible with fibrotic interstitial lung disease with extensive air trapping and absence of basilar gradient, strongly favoring chronic hypersensitivity pneumonitis. Findings have slightly progressed since 04/24/2017, with more clear progression since more remote chest CT of 06/24/2014. Findings are suggestive of an alternative diagnosis (not UIP) per consensus guidelines: Diagnosis of Idiopathic Pulmonary Fibrosis: An Official ATS/ERS/JRS/ALAT Clinical Practice Guideline. [place], Iss 5, [phone], Feb 04 2017. 2. Stable mild mediastinal lymphadenopathy, most compatible with benign reactive adenopathy. 3. One vessel coronary atherosclerosis. 4.  Small hiatal hernia.  Aortic Atherosclerosis (ICD10-I70.0).     Results for Joy, Smith (MRN [id number]) as of 07/29/2019 11:43  Ref. Range 07/04/2014 12:58 09/03/2015 11:04 09/27/2016 13:56 05/01/2017 14:05 07/19/2019 15:54  FVC-Pre Latest Units: L 2.13 1.93 1.86 1.71 1.61  FVC-%Pred-Pre Latest Units: % 66 61 59 55 53   Results for SYRAH, DAUGHTREY (MRN [id number]) as of 07/29/2019 11:43  Ref. Range 07/04/2014 12:58 09/03/2015 11:04 09/27/2016 13:56 05/01/2017 14:05 07/19/2019 15:54  DLCO unc Latest Units:  ml/min/mmHg 13.70 14.70 12.69 11.23 17.11  DLCO unc % pred Latest Units: % 48 51 44 39 82     ROS  OV 12/03/2019  Subjective:  Patient ID: [name], female , DOB: 1940-10-01 , age 67 y.o. , MRN: [id number] , ADDRESS: [place] [id number]   12/03/2019 -   Chief Complaint  Patient presents with  . 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 Interestingly, there are scattered non-necrotizing granulomata in the pleura.  -Second opinion September 2016 at [facility]: pathology was reviewed at our multidisciplinary ILD conference and noted to have a variegated pattern of fibrosis with some airway centricity, granulomas noted both near the airway and more peripherally. Minimal inflammation was noted. Features not c/w with UIP. Few giant cells with cholesterol clefts. Overall pattern most in keeping with chronic HP   - 3rd opinon: June 2021 [facility] ILD [place] - HP   -High-resolution CT chest including April 09, 2018: Lack of craniocaudal gradient and significant air trapping suggestive of  chronic hypersensitivity pneumonitis [September 2016 [facility] evaluation thought CT was inconsistent with UIP]  -Clinical diagnosis of chronic hypersensitivity pneumonitis   - dx given Sept 2016 at [place]  -No clear-cut exposure identified at [facility] evaluation September 2016 by Dr. Dorothyann Peng  -Steroids recommended but patient preferred not to take it September 2016 at [place]   - deferred steroids sept 2016 visit at [facility] with Dr Sherri Sear  - nintedanib since March 2020   #Small hiatal hernia with acid reflux disease   HPI [name] 79 y.o. -presents with her son for follow-up.  She continues to be on 3 L at rest 4 L with exertion.  Her acute bronchitis from April 2021 is resolved.  Her overall symptoms are stable/slightly better after her acute bronchitis.  There is no interim complaints.  In the interim we did discuss her at her case conference.  The consensus diagnosis is that she has hypersensitive pneumonitis.  The only exposure is when she was working in the [employer] at in [place].  She and her son again denies any ongoing exposures.  Reviewed her pulmonary function test and it is stable compared to the recent ones.  There is progression over the last few years to several years.  In terms of a high-resolution CT chest is also stable since 2019 through 2021 but progressive when compared to earlier time points.  She was started on nintedanib in March 2020.  She is tolerating this quite well.  It appears that she and she went on nintedanib she is stable.  She yet to do pulmonary rehabilitation.  Last done few years ago.  Son feels that she needs to be more mobile She is yet to connect with the support group She did not get referred to cardiology for right heart catheterization. She wants a flutter valve for her associated bronchiectasis seen on CT scan    SYMPTOM SCALE - ILD 07/29/2019  10/03/2019  12/03/2019   O2 use 3L at rest, occ 4L with exertion 3L at rest and 4L with  exertion. Acute bronchitis 2 weeks ago 3L  rest, 4 L exertion  Shortness of Breath 0 -> 5 scale with 5 being worst (score 6 If unable to do)    At rest 0 1 0  Simple tasks - showers, clothes change, eating, shaving _0 Household (dishes, doing bed, laundry) _1 Shopping _2 Walking level at own pace _3 Walking up Stairs _4 Total (30-36) Dyspnea Score _5 How bad is your cough? 1 3.5   How bad is your fatigue 0 3.5 2  How bad is nausea 0 0 0  How bad is vomiting?  0 0 0  How bad is diarrhea? 0 0 0  How bad is anxiety? 1 1.5 1  How bad is depression _6 Results for Joy, Smith (MRN [id number]) as of 07/29/2019 11:43  Ref. Range 07/04/2014 12:58 09/03/2015 11:04 09/27/2016 13:56 05/01/2017 14:05 07/19/2019 15:54 - ofev march 2020 6/29 on ofev  FVC-Pre Latest Units: L 2.13 1.93 1.86 1.71 1.61 1.66  FVC-%Pred-Pre Latest Units: % 66 61 59 55 53 55%   Results for Joy, Smith (MRN [id number]) as of 07/29/2019 11:43  Ref. Range 07/04/2014 12:58 09/03/2015 11:04 09/27/2016 13:56 05/01/2017 14:05 07/19/2019 15:54 11/24/19  DLCO unc Latest Units: ml/min/mmHg 13.70 14.70 12.69 11.23 17.11 13.2  DLCO unc % pred Latest Units: % 48 51 44 39 82 63%     ROS - per HPI  CT chest high resolution - may 2021  IMPRESSION: 1. Spectrum of findings compatible with fibrotic interstitial lung disease with moderate air trapping and absence of apicobasilar gradient. Findings are most compatible with chronic hypersensitivity pneumonitis. No interval progression since 2019 chest CT. Evidence of progression since baseline 2016 chest CT. Findings are suggestive of an alternative diagnosis (not UIP) per consensus guidelines: Diagnosis of Idiopathic Pulmonary Fibrosis: An Official ATS/ERS/JRS/ALAT Clinical Practice Guideline. [place], Iss 5, [phone], Feb 04 2017. 2. Stable mild mediastinal lymphadenopathy, compatible with benign reactive  adenopathy. 3. Small hiatal hernia. 4. Aortic Atherosclerosis (ICD10-I70.0).   Electronically Signed   By: Ilona Sorrel M.D.   On: 10/24/2019 15:02    has a past medical history of Allergic rhinitis, Anxiety, Arthritis, Asthma, Carpal tunnel syndrome, CHF (congestive heart failure) ([place]), GERD (gastroesophageal reflux disease), History of bronchitis (3-73yr ago), History of colon polyps, Joint pain, Peripheral edema, Pneumonitis, hypersensitivity ([place] (05/2016), Pulmonary hypertension ([place] (06/30/2017), Shortness of breath dyspnea, and Thyroid nodule.   reports that she has never smoked. She has never used smokeless tobacco.  Past Surgical History:  Procedure Laterality Date  . BREAST CYST EXCISION Left 50 yrs ago  . CARPELL TUNNELL SURG Right   . COLONOSCOPY    . ESOPHAGOGASTRODUODENOSCOPY    . EYE SURGERY     bilateral cataract surgery with lens implants  . KNEE ARTHROSCOPY W/ MENISCAL REPAIR     left  . LUNG BIOPSY Right 11/20/2014   Procedure: LUNG BIOPSY;  Surgeon: SMelrose Nakayama MD;  [place] [facility] [facility]  Service: Thoracic;  Laterality: Right;  . REVERSE SHOULDER ARTHROPLASTY Right 06/07/2018   Procedure: REVERSE SHOULDER ARTHROPLASTY;  Surgeon: SJustice Britain MD;  Location: [facility];  Service: Orthopedics;  Laterality: Right;  1223m  . TOTAL KNEE ARTHROPLASTY Left 08/13/2013   Procedure: LEFT TOTAL KNEE ARTHROPLASTY;  Surgeon: MaMauri PoleMD;  Location: [facility];  Service: Orthopedics;  Laterality: Left;  . UMBILICAL  HERNIA REPAIR    . VIDEO ASSISTED THORACOSCOPY Right 11/20/2014   Procedure: RIGHT  VIDEO ASSISTED THORACOSCOPY WITH WEDGE LUNG BIOPSIES RIGHT UPPER,MIDDLE AND LOWER LOBES ,PLACEMENT OF ON Q PAIN PUMP;  Surgeon: Melrose Nakayama, MD;  Location: [facility];  Service: Thoracic;  Laterality: Right;    Allergies  Allergen Reactions  . Adhesive [Tape] Itching    Rash  . Latex Itching and Swelling  . Doxycycline Rash  . Sulfonamide Derivatives Other (See  Comments)    Unknown    Immunization History  Administered Date(s) Administered  . Fluad Quad(high Dose 65+) 02/07/2019  . Influenza, High Dose Seasonal PF 04/20/2016, 02/23/2017, 02/12/2018  . Influenza-Unspecified 03/06/2014, 02/18/2015  . PFIZER SARS-COV-2 Vaccination 07/12/2019, 08/06/2019  . Pneumococcal Conjugate-13 03/06/2014  . Tdap 06/14/2017    Family History  Problem Relation Age of Onset  . Tuberculosis Father   . Tuberculosis Paternal Uncle      Current Outpatient Medications:  .  acetaminophen (TYLENOL) 650 MG CR tablet, Take 650-1,300 mg by mouth every 8 (eight) hours as needed for pain., Disp: , Rfl:  .  albuterol (VENTOLIN HFA) 108 (90 Base) MCG/ACT inhaler, Inhale 2 puffs into the lungs every 6 (six) hours as needed for wheezing or shortness of breath., Disp: 1 Inhaler, Rfl: 2 .  aspirin 325 MG tablet, Take 650 mg by mouth every 6 (six) hours as needed for mild pain or moderate pain. , Disp: , Rfl:  .  fluticasone (FLONASE) 50 MCG/ACT nasal spray, Place 1 spray into both nostrils daily as needed for allergies. , Disp: , Rfl:  .  furosemide (LASIX) 20 MG tablet, Take 20 mg by mouth daily as needed for edema. , Disp: , Rfl:  .  loratadine (CLARITIN) 5 MG chewable tablet, Chew 5 mg by mouth daily as needed for allergies., Disp: , Rfl:  .  Nintedanib (OFEV) 150 MG CAPS, Take 1 capsule (150 mg total) by mouth 2 (two) times daily., Disp: 60 capsule, Rfl: 5 .  Respiratory Therapy Supplies (FLUTTER) DEVI, Use as directed., Disp: 1 each, Rfl: 0 .  sodium chloride HYPERTONIC 3 % nebulizer solution, USE CONTENTS OF 1 VIAL IN NEBULIZER 2 TIMES A DAY., Disp: 480 mL, Rfl: 0 .  cephALEXin (KEFLEX) 500 MG capsule, Take 1 capsule (500 mg total) by mouth 3 (three) times daily. (Patient not taking: Reported on 12/03/2019), Disp: 15 capsule, Rfl: 0 .  predniSONE (DELTASONE) 10 MG tablet, 33m x1day, 352mx1 day, 2029m1day, 76m70mday and then 1/2 tab on the last day (Patient not taking:  Reported on 12/03/2019), Disp: 11 tablet, Rfl: 0      Objective:   Vitals:   12/03/19 1042  BP: (!) 146/84  Pulse: 76  Temp: 98.5 F (36.9 C)  TempSrc: Oral  SpO2: 96%  Weight: 217 lb (98.4 kg)  Height: _0  (1.702 m)    Estimated body mass index is 33.99 kg/m as calculated from the following:   Height as of this encounter: _1  (1.702 m).   Weight as of this encounter: 217 lb (98.4 kg).  _2 @  [employer]6/29/21 1042  Weight: 217 lb (98.4 kg)     Physical Exam Obese lady with scattered crackles particularly in the upper lobes but also in the lower lobes.  No evidence of cor pulmonale.  No elevated neck nodes no elevated JVP.  Abdomen soft.  Normal heart sounds.       Assessment:  ICD-10-CM   1. Chronic respiratory failure with hypoxia (HCC)  J96.11   2. Bronchiectasis without acute exacerbation ([place])  J47.9 CANCELED: Ambulatory Referral for DME    CANCELED: Ambulatory Referral for DME  3. Interstitial lung disease ([place])  J84.9 Ambulatory Referral for DME    Hepatic function panel    Ambulatory referral to Cardiology    AMB referral to pulmonary rehabilitation    Pulmonary function test  4. 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 Therapeutic drug monitoring  Z51.81   6. Bronchiectasis without complication ([place])  J09.2 Ambulatory Referral for DME       Plan:     Patient Instructions      ICD-10-CM   1. Chronic respiratory failure with hypoxia (HCC)  J96.11   2. Bronchiectasis without acute exacerbation ([place])  J47.9 CANCELED: Ambulatory Referral for DME    CANCELED: Ambulatory Referral for DME  3. Interstitial lung disease ([place])  J84.9   4. 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 Therapeutic drug monitoring  Z51.81     Our conference feels you have Chronic HP - [facility]  Progrssive ILD/Fibrosis - UIP with granuloma  -this is a slowly  progressive over time but stable 2019/2020 -> 2021  I think ofev is helping you and glad ou are tolerating nintedanib fine   Plan   -Given stability of the lung since he started nintedanib we will hold off on adding prednisone and/or CellCept given the side effect profile of these medications -Continue oxygen as before -Continue nintedanib as before - Check LFT 12/03/2019 - Refer [facility] cardiology (Dr Jeffie Pollock or Loralie Champagne) or Dr. Christen Butter at [facility] for evaluation of right heart cath   - if evidence of PAH -then qualifies for TYVASO neb -Refer to pulmonary rehabilitation at [place] to Marlane Mingle [email or web address] and join the support group   Follow-up -6-week telephone visit with Dr. Chase Caller to discuss right heart catheterization results  - 3-4 months do spirometry/dlco - Rreturn to see Dr Chase Caller in 3-4 months for 30-minute face-to-face visit in ILD clinic   ( Level 05 visit: Estb 40-54 min   in  visit type: on-site physical face to visit  in total care time and counseling or/and coordination of care by this undersigned MD - Dr Brand Males. This includes one or more of the following on this same day 12/03/2019: pre-charting, chart review, note writing, documentation discussion of test results, diagnostic or treatment recommendations, prognosis, risks and benefits of management options, instructions, education, compliance or risk-factor reduction. It excludes time spent by the [place] or office staff in the care of the patient. Actual time 21 min)     SIGNATURE    Dr. Brand Males, M.D., F.C.C.P,  Pulmonary and Critical Care Medicine Staff Physician, [place] Director - Interstitial Lung Disease  Program  Pulmonary [place] at [place], [place], [address]  Pager: [phone], If no answer or between  15:00h - 7:00h: call [phone] Telephone: [phone]  11:37  AM 12/03/2019

## 2019-12-03 NOTE — Telephone Encounter (Signed)
Pt insurance is active and benefits verified through Medicare a/b Co-pay 0, DED $203/$203 met, out of pocket 0/0 met, co-insurance 20%. no pre-authorization required.  2ndary insurance is active and benefits verified through [employer]. Co-pay 0, DED 0/0 met, out of pocket 0/0 met, co-insurance 0. No pre-authorization required.

## 2019-12-03 NOTE — Progress Notes (Signed)
Spirometry/DLCO performed today.

## 2019-12-03 NOTE — Progress Notes (Signed)
Normal LFT  Lab             12/03/19                      1140         AST          18           ALT          14           ALKPHOS      76           BILITOT      0.3          PROT         7.3          ALBUMIN      4.1

## 2019-12-03 NOTE — Patient Instructions (Addendum)
ICD-10-CM   1. Chronic respiratory failure with hypoxia (HCC)  J96.11   2. Bronchiectasis without acute exacerbation ([place])  J47.9 CANCELED: Ambulatory Referral for DME    CANCELED: Ambulatory Referral for DME  3. Interstitial lung disease ([place])  J84.9   4. 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 Christen Butter at [facility] cardiovascular for evaluation of right heart cath   - if evidence of PAH -then qualifies for TYVASO neb -Refer to pulmonary rehabilitation at [place] to Marlane Mingle [email or web address] and join the support group   Follow-up -6-week telephone visit with Dr. Chase Caller to discuss right heart catheterization results  - 3-4 months do spirometry/dlco - Rreturn to see Dr Chase Caller in 3-4 months for 30-minute face-to-face visit in ILD clinic

## 2019-12-04 ENCOUNTER — Telehealth (HOSPITAL_COMMUNITY): Payer: Self-pay

## 2019-12-13 ENCOUNTER — Telehealth (HOSPITAL_COMMUNITY): Payer: Self-pay | Admitting: Family Medicine

## 2019-12-17 ENCOUNTER — Telehealth (HOSPITAL_COMMUNITY): Payer: Self-pay

## 2019-12-19 ENCOUNTER — Telehealth (HOSPITAL_COMMUNITY): Payer: Self-pay

## 2019-12-27 ENCOUNTER — Telehealth: Payer: Self-pay | Admitting: Internal Medicine

## 2019-12-27 NOTE — Telephone Encounter (Signed)
Spoke with Dr. Annamaria Boots who stated Ofev is fine to take with Amoxicillin and Hydrocodone. Called patient and relayed information. Nothing further needed.

## 2020-01-07 IMAGING — DX CHEST - 2 VIEW
2 series · 2 of 2 positions shown · non-contrast
Comparison: Single-view of the chest 06/07/2018. PA and lateral
chest 07/14/2017. Chest CT 04/09/2018.

CLINICAL DATA: History of bronchiectasis and pulmonary
hypertension.

EXAM:
CHEST - 2 VIEW

[chest pa]
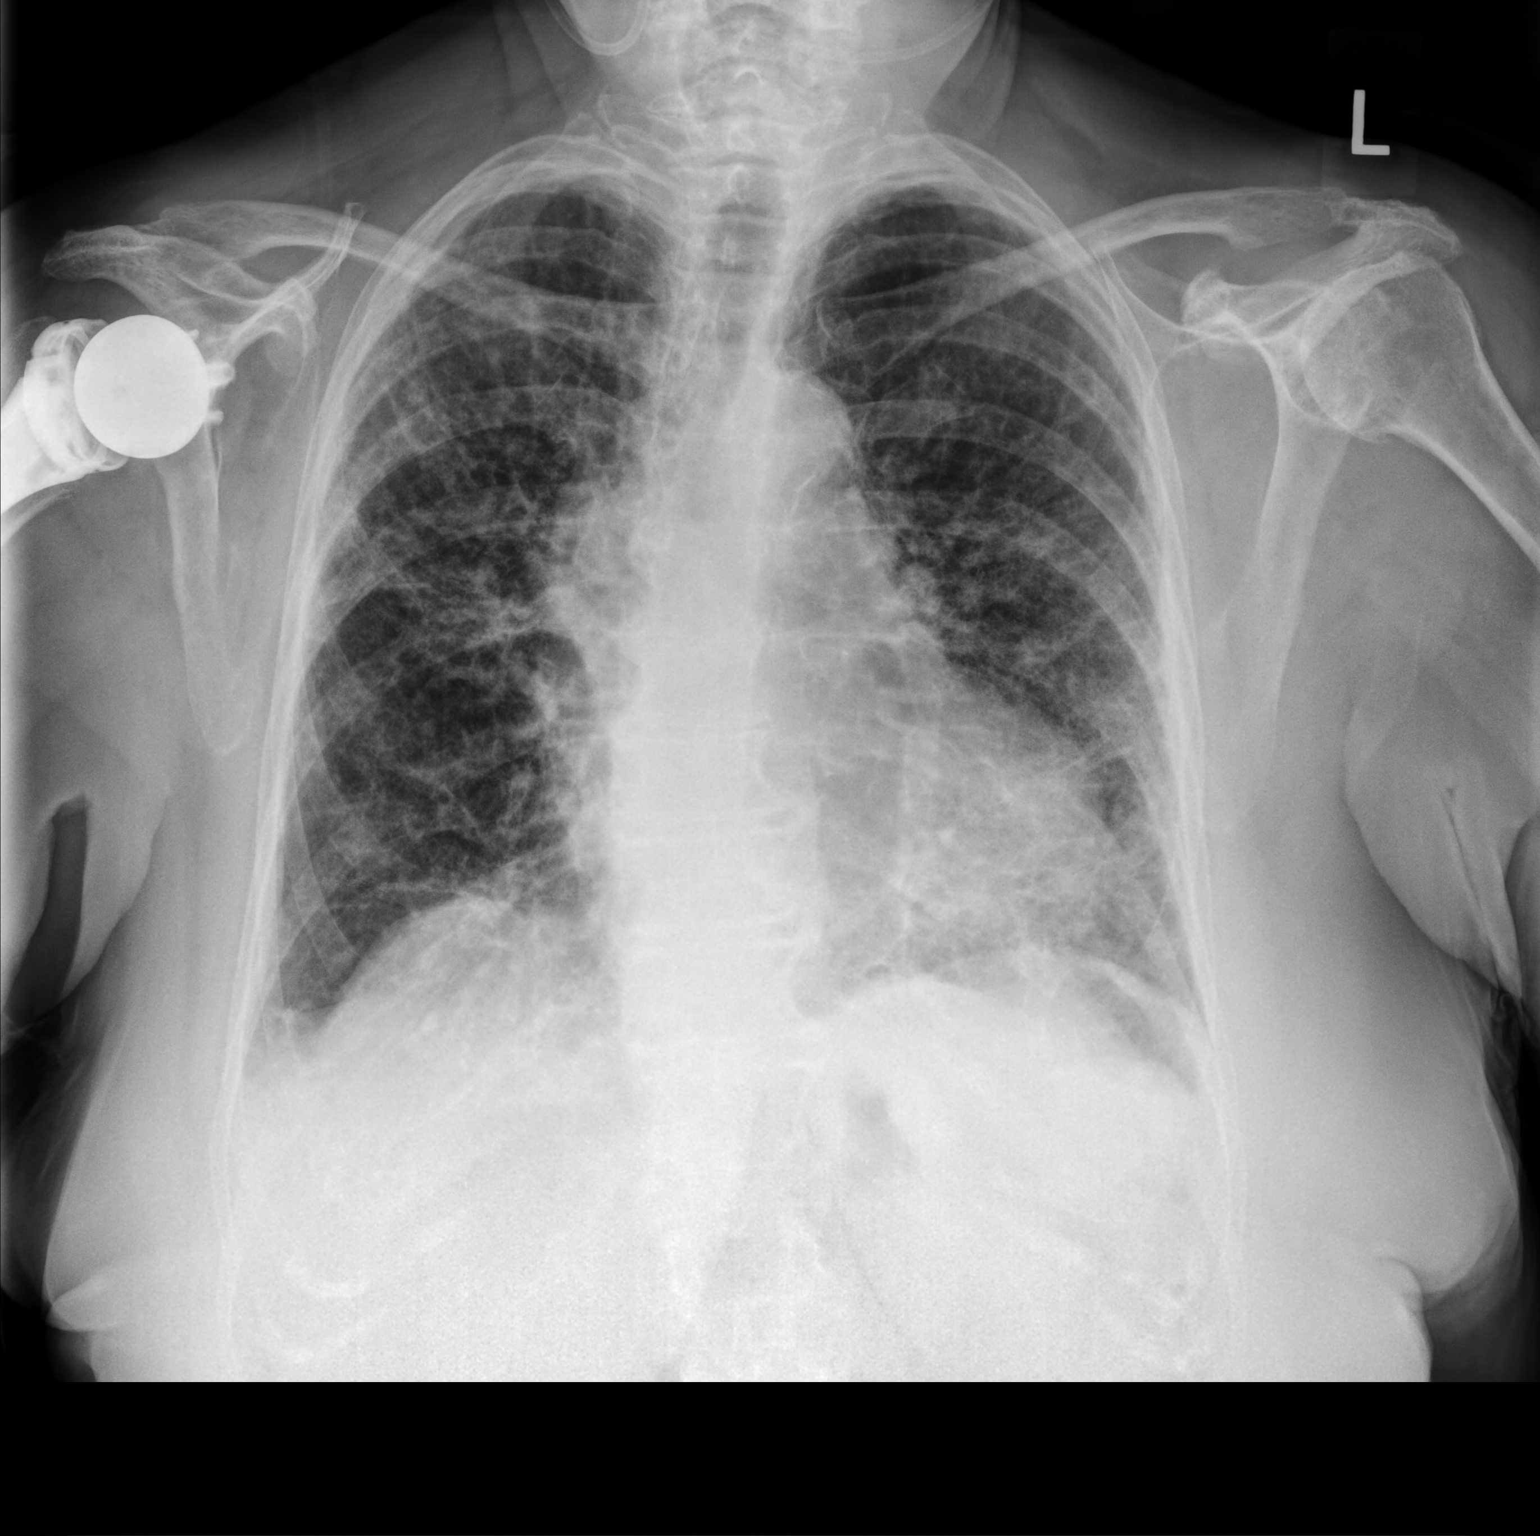

[chest lat]
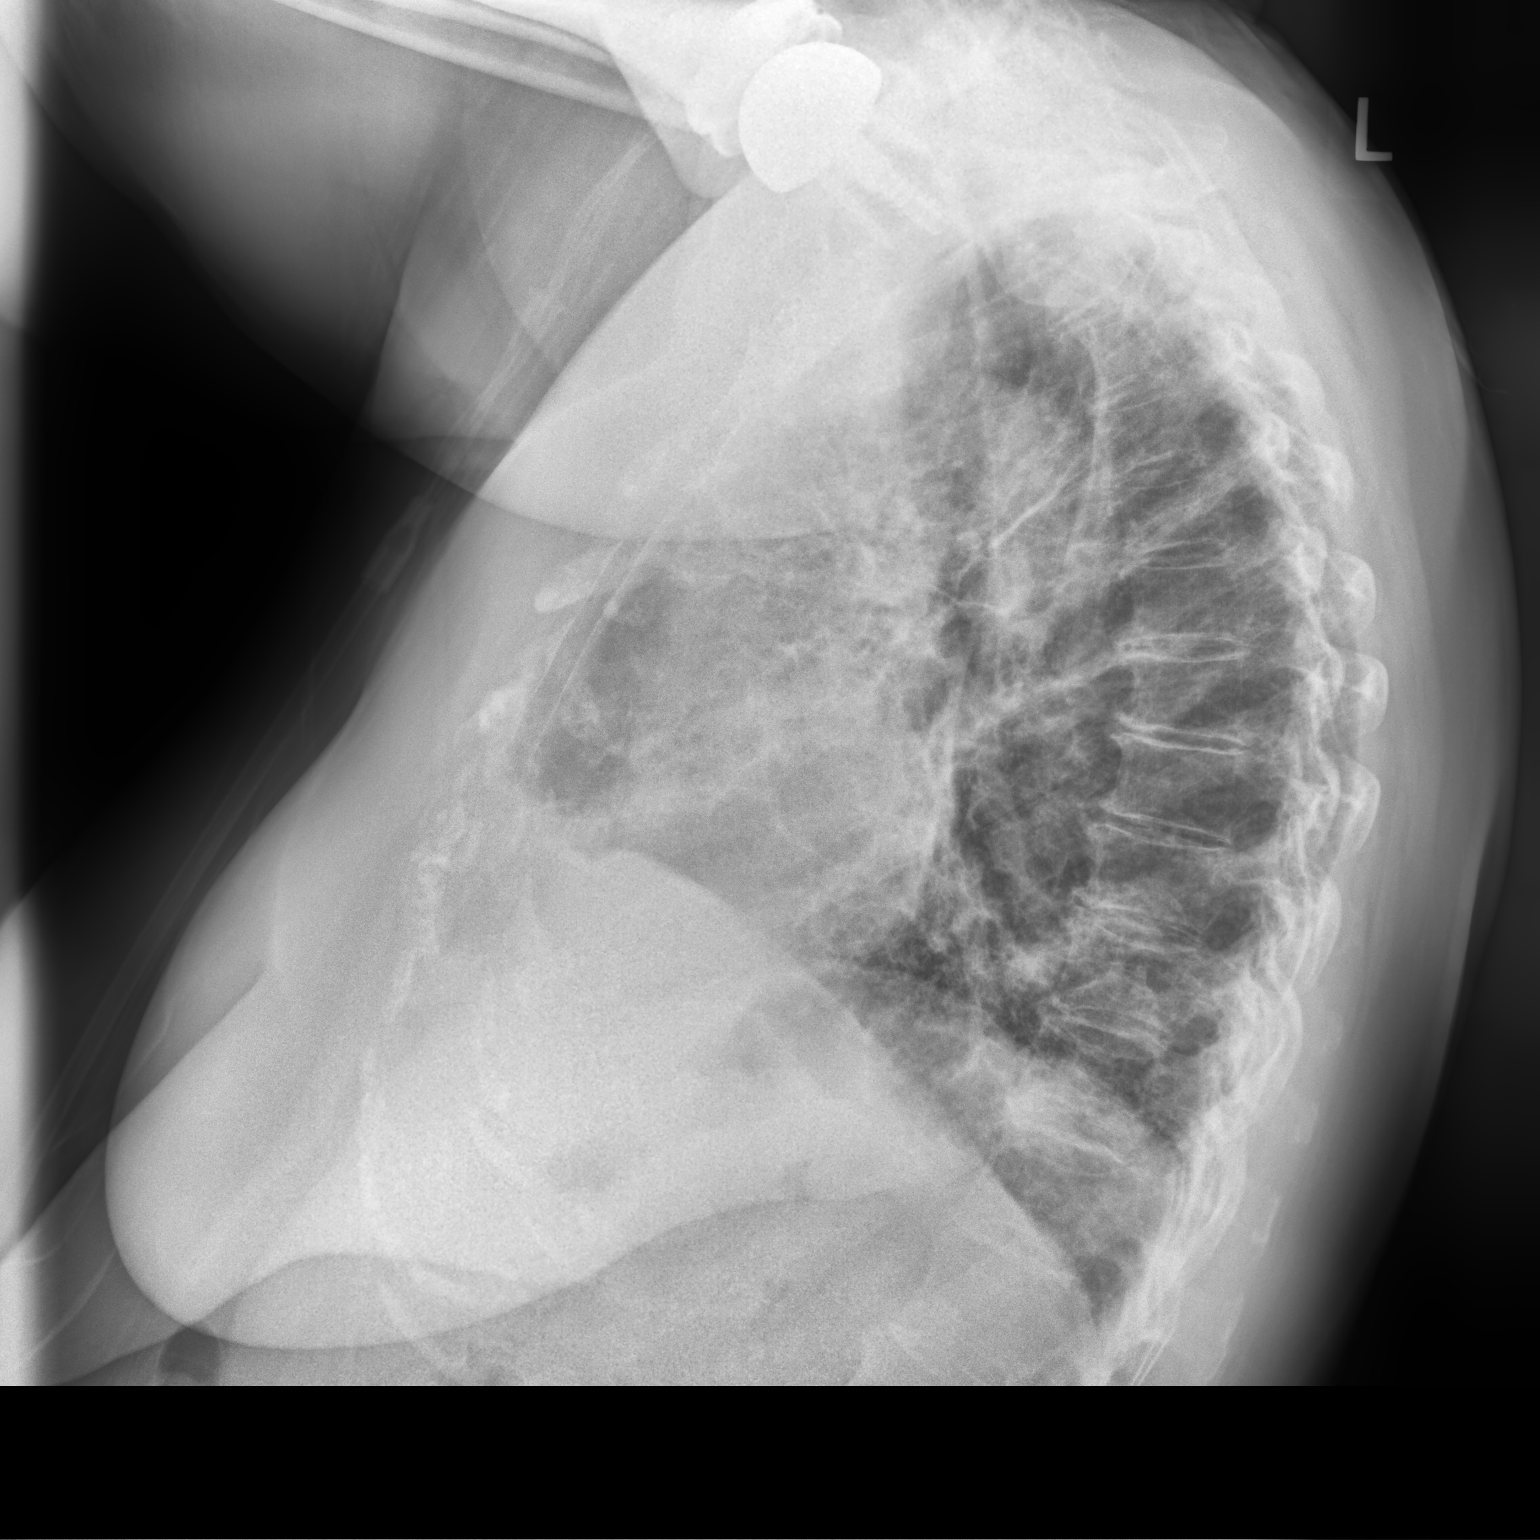

[2 of 2 positions shown; findings below may reference images not displayed]

FINDINGS: Pulmonary fibrosis is not markedly changed in appearance given
differences in technique compared to the most recent examination. No
new airspace disease or effusion. Heart size is upper normal. No
acute or focal bony abnormality.
IMPRESSION: No marked change in pulmonary fibrosis.  No acute disease.

## 2020-01-09 ENCOUNTER — Telehealth (HOSPITAL_COMMUNITY): Payer: Self-pay

## 2020-01-14 ENCOUNTER — Encounter (HOSPITAL_COMMUNITY): Payer: Self-pay

## 2020-01-14 ENCOUNTER — Encounter (HOSPITAL_COMMUNITY)
Admission: RE | Admit: 2020-01-14 | Discharge: 2020-01-14 | Disposition: A | Discharge status: Home / Self Care | Payer: Medicare Other | Source: Ambulatory Visit | Attending: Internal Medicine | Admitting: Internal Medicine

## 2020-01-14 ENCOUNTER — Other Ambulatory Visit: Payer: Self-pay

## 2020-01-14 VITALS — BP 157/60 | HR 80 | Temp 98.3°F | Resp 20

## 2020-01-14 DIAGNOSIS — J849 Interstitial pulmonary disease, unspecified: Secondary | ICD-10-CM | POA: Diagnosis not present

## 2020-01-14 NOTE — Progress Notes (Signed)
[name] 79 y.o. female Pulmonary Rehab Orientation Note Deaja who was referred to Pulmonary rehab by Dr. Chase Caller with the diagnosis of ILD arrived today in Cardiac and Pulmonary Rehab. She arrived ambulatory from [place] parking area with no assistive device and with slightly off balance gait. She does carry portable oxygen. [facility] is the provider for their MDE. Per pt, she uses oxygen continuously. Color good, skin warm and dry. Patient is oriented to time and place. Patient's medical history, psychosocial health, and medications reviewed. Psychosocial assessment reveals pt lives alone however her son lives around the corner from her and her daughter is 10 minutes away. 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 Pt hobbies include shopping,spending time with grandchildren and working with [employer]. Pt reports her stress level normal everyday stressors.   Pt does not exhibit signs of depression.  PHQ2/9 score 0/4. Pt shows good  coping skills with positive outlook . Beata offered emotional support and reassurance. Will continue to monitor and evaluate progress toward psychosocial goal(s) of Continued well being both mentally and physically. Physical assessment reveals heart rate is normal, breath sounds clear to auscultation, no wheezes, rales, or rhonchi, with characteristic crackles for ILD ausculted in the lower bases. Grip strength equal, strong. Distal pulses palpable. Patient reports she does take medications as prescribed. Patient states she follows a Regular diet. The patient reports no specific efforts to gain or lose weight.. Patient's weight will be monitored closely. Demonstration and practice of PLB using pulse oximeter. Patient able to return demonstration satisfactorily. Safety and hand hygiene in the exercise area reviewed with patient. Patient voices understanding of the information reviewed. Department expectations discussed with patient and achievable goals were set. The  patient shows enthusiasm about attending the program and we look forward to working with this nice patient again.. The patient completed  6 min walk test on 01/14/20  and to begin exercise on 01/21/20. 45 minutes was spent on a variety of activities such as assessment of the patient, obtaining baseline data including height, weight, BMI, and grip strength, verifying medical history, allergies, and current medications, and teaching patient strategies for performing tasks with less respiratory effort with emphasis on pursed lip breathing. Cherre Huger, BSN Cardiac and Training and development officer

## 2020-01-14 NOTE — Progress Notes (Signed)
Pulmonary Individual Treatment Plan   Patient Details  Name: [name] MRN: [id number] Date of Birth: 19-Mar-1941 Referring Provider:     Pulmonary Rehab Walk Test from 01/14/2020 in [place]  Referring Provider Dr. Chase Caller      Initial Encounter Date:    Pulmonary Rehab Walk Test from 01/14/2020 in [place]  Date 01/14/20      Visit Diagnosis: ILD (interstitial lung disease) ([place])  Patient's Home Medications on Admission:   Current Outpatient Medications:  .  acetaminophen (TYLENOL) 650 MG CR tablet, Take 650-1,300 mg by mouth every 8 (eight) hours as needed for pain., Disp: , Rfl:  .  albuterol (VENTOLIN HFA) 108 (90 Base) MCG/ACT inhaler, Inhale 2 puffs into the lungs every 6 (six) hours as needed for wheezing or shortness of breath., Disp: 1 Inhaler, Rfl: 2 .  aspirin 325 MG tablet, Take 650 mg by mouth every 6 (six) hours as needed for mild pain or moderate pain. , Disp: , Rfl:  .  fluticasone (FLONASE) 50 MCG/ACT nasal spray, Place 1 spray into both nostrils daily as needed for allergies. , Disp: , Rfl:  .  furosemide (LASIX) 20 MG tablet, Take 20 mg by mouth daily as needed for edema. , Disp: , Rfl:  .  loratadine (CLARITIN) 5 MG chewable tablet, Chew 5 mg by mouth daily as needed for allergies., Disp: , Rfl:  .  Nintedanib (OFEV) 150 MG CAPS, Take 1 capsule (150 mg total) by mouth 2 (two) times daily., Disp: 60 capsule, Rfl: 5 .  Respiratory Therapy Supplies (FLUTTER) DEVI, Use as directed., Disp: 1 each, Rfl: 0 .  sodium chloride HYPERTONIC 3 % nebulizer solution, USE CONTENTS OF 1 VIAL IN NEBULIZER 2 TIMES A DAY., Disp: 480 mL, Rfl: 0  Past Medical History: Past Medical History:  Diagnosis Date  . Allergic rhinitis    takes Claritin nightly  . Anxiety    but not on any meds  . Arthritis   . Asthma   . Carpal tunnel syndrome   . CHF (congestive heart failure) ([place])    patient denies-in  office note 05/2015 by Aleene Davidson  . GERD (gastroesophageal reflux disease)   . History of bronchitis 3-69yr ago  . History of colon polyps    benign  . Joint pain   . Peripheral edema    takes Lasix daily  . Pneumonitis, hypersensitivity ([place] 05/[place]   chronic hypersensitivity pneumonitis- Dr. MSpero Curb . Pulmonary hypertension ([place] 06/30/2017   patient denies-in office note from Dr. MSpero Curb  . Shortness of breath dyspnea    with exertion;Albuterol inhaler as needed  . Thyroid nodule     Tobacco Use: Social History   Tobacco Use  Smoking Status Never Smoker  Smokeless Tobacco Never Used    Labs: Recent Review Flowsheet Data    Labs for ITP Cardiac and Pulmonary Rehab Latest Ref Rng & Units 11/18/2014 11/21/2014 06/07/2018   PHART 7.35 - 7.45 7.426 7.356 7.412   PCO2ART 32 - 48 mmHg 40.7 49.1(H) 43.1   HCO3 20.0 - 28.0 mmol/L 26.3(H) 26.8(H) 26.9   TCO2 0 - 100 mmol/L 27.5 28.4 -   O2SAT % 96.8 96.2 99.8      Capillary Blood Glucose: Lab Results  Component Value Date   GLUCAP 135 (H) 06/08/2018   GLUCAP 142 (H) 06/08/2018   GLUCAP 131 (H) 06/08/2018     Pulmonary Assessment Scores:  Pulmonary Assessment Scores  [place] Name 01/14/20 1634         ADL UCSD   ADL Phase Entry       mMRC Score   mMRC Score 3           UCSD: Self-administered rating of dyspnea associated with activities of daily living (ADLs) 6-point scale (0 = "not at all" to 5 = "maximal or unable to do because of breathlessness")  Scoring Scores range from 0 to 120.  Minimally important difference is 5 units  CAT: CAT can identify the health impairment of COPD patients and is better correlated with disease progression.  CAT has a scoring range of zero to 40. The CAT score is classified into four groups of low (less than 10), medium (10 - 20), high (21-30) and very high (31-40) based on the impact level of disease on health status. A CAT score over 10 suggests significant symptoms.  A  worsening CAT score could be explained by an exacerbation, poor medication adherence, poor inhaler technique, or progression of COPD or comorbid conditions.  CAT MCID is 2 points  mMRC: mMRC (Modified Medical Research Council) Dyspnea Scale is used to assess the degree of baseline functional disability in patients of respiratory disease due to dyspnea. No minimal important difference is established. A decrease in score of 1 point or greater is considered a positive change.   Pulmonary Function Assessment:   Exercise Target Goals: Exercise Program Goal: Individual exercise prescription set using results from initial 6 min walk test and THRR while considering  patient's activity barriers and safety.   Exercise Prescription Goal: Initial exercise prescription builds to 30-45 minutes a day of aerobic activity, 2-3 days per week.  Home exercise guidelines will be given to patient during program as part of exercise prescription that the participant will acknowledge.  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 HR 130 bpm     Max Ex. BP 180/82     2 Minute Post BP 156/84         Interval HR   1 Minute HR 110     2 Minute HR 123     3 Minute HR 125     4 Minute HR 128     5 Minute HR 128     6 Minute HR  130     2 Minute Post HR 92     Interval Heart Rate? Yes       Interval Oxygen   Interval Oxygen? Yes     Baseline Oxygen Saturation % 99 %     1 Minute Oxygen Saturation % 98 %     1 Minute Liters of Oxygen 4 L     2 Minute Oxygen Saturation % 93 %     2 Minute Liters of Oxygen 4 L     3 Minute Oxygen Saturation % 92 %     3 Minute Liters of Oxygen 4 L     4 Minute Oxygen Saturation % 92 %     4 Minute Liters of Oxygen 4 L     5 Minute Oxygen Saturation % 92 %     5 Minute Liters of Oxygen 4 L     6 Minute Oxygen Saturation % 92 %     6 Minute Liters of Oxygen 4 L     2 Minute Post Oxygen Saturation % 98 %     2 Minute Post Liters of Oxygen 4 L            Oxygen Initial Assessment:  Oxygen Initial Assessment - 01/14/20 1625      Initial 6 min Walk   Oxygen Used Continuous    Liters per minute 4      Program Oxygen Prescription   Program Oxygen Prescription Continuous    Liters per minute 4      Intervention   Short Term Goals To learn and exhibit compliance with exercise, home and travel O2 prescription;To learn and understand importance of monitoring SPO2 with pulse oximeter and demonstrate accurate use of the pulse oximeter.;To learn and demonstrate proper pursed lip breathing techniques or other breathing techniques.;To learn and demonstrate proper use of respiratory medications;To learn and understand importance of maintaining oxygen saturations>88%    Long  Term Goals Exhibits compliance with exercise, home and travel O2 prescription;Verbalizes importance of monitoring SPO2 with pulse oximeter and return demonstration;Maintenance of O2 saturations>88%;Exhibits proper breathing techniques, such as pursed lip breathing or other method taught during program session;Compliance with respiratory medication;Demonstrates proper use of MDI's            Oxygen Re-Evaluation:   Oxygen Discharge (Final Oxygen Re-Evaluation):   Initial Exercise Prescription:  Initial Exercise Prescription - 01/14/20 1600      Date of Initial Exercise RX and Referring Provider   Date 01/14/20    Referring Provider Dr. Chase Caller      Oxygen   Oxygen Continuous    Liters 4      NuStep   Level 2    SPM 80    Minutes 15      Track   Laps 10    Minutes 15      Prescription Details   Frequency (times per week) 2    Duration Progress to 30 minutes of continuous aerobic without signs/symptoms of physical distress      Intensity   THRR 40-80% of Max Heartrate 56-112    Ratings of Perceived Exertion 11-13    Perceived Dyspnea 0-4      Resistance Training   Training Prescription Yes    Weight orange bands    Reps 10-15           Perform Capillary Blood Glucose checks as needed.  Exercise Prescription Changes:   Exercise Comments:   Exercise Goals and Review:  Exercise  Goals    Row Name 01/14/20 1457 01/14/20 1637           Exercise Goals   Increase Physical Activity -- Yes      Intervention -- Provide advice, education, support and counseling about physical activity/exercise needs.;Develop an individualized exercise prescription for aerobic and resistive training based on initial evaluation findings, risk stratification, comorbidities and participant's personal goals.      Expected Outcomes -- Short Term: Attend rehab on a regular basis to increase amount of physical activity.;Long Term: Add in home exercise to make exercise part of routine and to increase amount of physical activity.;Long Term: Exercising regularly at least 3-5 days a week.      Increase Strength and Stamina Yes Yes      Intervention Provide advice, education, support and counseling about physical activity/exercise needs.;Develop an individualized exercise prescription for aerobic and resistive training based on initial evaluation findings, risk  stratification, comorbidities and participant's personal goals. Provide advice, education, support and counseling about physical activity/exercise needs.;Develop an individualized exercise prescription for aerobic and resistive training based on initial evaluation findings, risk stratification, comorbidities and participant's personal goals.      Expected Outcomes Short Term: Increase workloads from initial exercise prescription for resistance, speed, and METs.;Short Term: Perform resistance training exercises routinely during rehab and add in resistance training at home;Long Term: Improve cardiorespiratory fitness, muscular endurance and strength as measured by increased METs and functional capacity (6MWT) Short Term: Increase workloads from initial exercise prescription for resistance, speed, and METs.;Short Term: Perform resistance training exercises routinely during rehab and add in resistance training at home;Long Term: Improve cardiorespiratory fitness, muscular endurance and strength as measured by increased METs and functional capacity (6MWT)      Able to understand and use rate of perceived exertion (RPE) scale Yes Yes      Intervention Provide education and explanation on how to use RPE scale Provide education and explanation on how to use RPE scale      Expected Outcomes Long Term:  Able to use RPE to guide intensity level when exercising independently;Short Term: Able to use RPE daily in rehab to express subjective intensity level Long Term:  Able to use RPE to guide intensity level when exercising independently;Short Term: Able to use RPE daily in rehab to express subjective intensity level      Able to understand and use Dyspnea scale Yes Yes      Intervention Provide education and explanation on how to use Dyspnea scale Provide education and explanation on how to use Dyspnea scale      Expected Outcomes Short Term: Able to use Dyspnea scale daily in rehab to express subjective sense of  shortness of breath during exertion;Long Term: Able to use Dyspnea scale to guide intensity level when exercising independently Short Term: Able to use Dyspnea scale daily in rehab to express subjective sense of shortness of breath during exertion;Long Term: Able to use Dyspnea scale to guide intensity level when exercising independently      Knowledge and understanding of Target Heart Rate Range (THRR) -- Yes      Intervention -- Provide education and explanation of THRR including how the numbers were predicted and where they are located for reference      Expected Outcomes -- Short Term: Able to state/look up THRR;Short Term: Able to use daily as guideline for intensity in rehab;Long Term: Able to use THRR to govern intensity when exercising independently      Understanding of Exercise Prescription  Yes Yes      Intervention Provide education, explanation, and written materials on patient's individual exercise prescription Provide education, explanation, and written materials on patient's individual exercise prescription      Expected Outcomes Short Term: Able to explain program exercise prescription;Long Term: Able to explain home exercise prescription to exercise independently Short Term: Able to explain program exercise prescription;Long Term: Able to explain home exercise prescription to exercise independently             Exercise Goals Re-Evaluation :   Discharge Exercise Prescription (Final Exercise Prescription Changes):   Nutrition:  Target Goals: Understanding of nutrition guidelines, daily intake of sodium <1594m, cholesterol <2078m calories 30% from fat and 7% or less from saturated fats, daily to have 5 or more servings of fruits and vegetables.  Biometrics:  Pre Biometrics - 01/14/20 1638      Pre Biometrics   Grip Strength 36 kg            Nutrition Therapy Plan and Nutrition Goals:   Nutrition Assessments:   Nutrition Goals Re-Evaluation:   Nutrition Goals  Discharge (Final Nutrition Goals Re-Evaluation):   Psychosocial: Target Goals: Acknowledge presence or absence of significant depression and/or stress, maximize coping skills, provide positive support system. Participant is able to verbalize types and ability to use techniques and skills needed for reducing stress and depression.  Initial Review & Psychosocial Screening:  Initial Psych Review & Screening - 01/14/20 1506      Initial Review   Current issues with None Identified      Family Dynamics   Comments son lives around the corner - supportive of Jaislyn.  Pt daughter lives about 10 minutes awake.      Barriers   Psychosocial barriers to participate in program There are no identifiable barriers or psychosocial needs.      Screening Interventions   Interventions Encouraged to exercise           Quality of Life Scores:  Scores of 19 and below usually indicate a poorer quality of life in these areas.  A difference of  2-3 points is a clinically meaningful difference.  A difference of 2-3 points in the total score of the Quality of Life Index has been associated with significant improvement in overall quality of life, self-image, physical symptoms, and general health in studies assessing change in quality of life.  PHQ-9: Recent Review Flowsheet Data    [facility] 01/14/2020 12/13/2016 07/18/2016 02/16/2015   Decreased Interest 0 0 0 0   Down, Depressed, Hopeless 0 0 0 0   PHQ - 2 Score 0 0 0 0   Altered sleeping 0 - - -   Tired, decreased energy 2 - - -   Change in appetite 1 - - -   Feeling bad or failure about yourself  1 - - -   Trouble concentrating 0 - - -   Moving slowly or fidgety/restless 0 - - -   Suicidal thoughts 0 - - -   PHQ-9 Score 4 - - -   Difficult doing work/chores Not difficult at all - - -     Interpretation of Total Score  Total Score Depression Severity:  1-4 = Minimal depression, 5-9 = Mild depression, 10-14 = Moderate depression,  15-19 = Moderately severe depression, 20-27 = Severe depression   Psychosocial Evaluation and Intervention:  Psychosocial Evaluation - 01/14/20 1508      Psychosocial Evaluation & Interventions   Interventions Encouraged to exercise  with the program and follow exercise prescription    Expected Outcomes Pt will demonstrate appropriate and healthy coping skills with no reportable psychosocial barriers to participating    Continue Psychosocial Services  No Follow up required           Psychosocial Re-Evaluation:   Psychosocial Discharge (Final Psychosocial Re-Evaluation):   Education: Education Goals: Education classes will be provided on a weekly basis, covering required topics. Participant will state understanding/return demonstration of topics presented.  Learning Barriers/Preferences:  Learning Barriers/Preferences - 01/14/20 1509      Learning Barriers/Preferences   Learning Barriers Sight;Hearing   reader glasses; age appropriate hearing loss   Learning Preferences Group Instruction;Individual Instruction;Skilled Demonstration;Verbal Instruction;Written Material;Computer/Internet           Education Topics: Risk Factor Reduction:  -Group instruction that is supported by a PowerPoint presentation. Instructor discusses the definition of a risk factor, different risk factors for pulmonary disease, and how the heart and lungs work together.     PULMONARY REHAB OTHER RESPIRATORY from 12/08/2016 in [place]  Date 08/25/16  Educator EP  Instruction Review Code (Retired) 2- meets goals/outcomes      Nutrition for Pulmonary Patient:  -Group instruction provided by [employer], verbal discussion, and written materials to support subject matter. The instructor gives an explanation and review of healthy diet recommendations, which includes a discussion on weight management, recommendations for fruit and vegetable consumption, as well as  protein, fluid, caffeine, fiber, sodium, sugar, and alcohol. Tips for eating when patients are short of breath are discussed.   PULMONARY REHAB OTHER RESPIRATORY from 12/08/2016 in [place]  Date 11/24/16  Educator RD  Instruction Review Code (Retired) R- Review/reinforce      Pursed Lip Breathing:  -Group instruction that is supported by demonstration and informational handouts. Instructor discusses the benefits of pursed lip and diaphragmatic breathing and detailed demonstration on how to preform both.     Oxygen Safety:  -Group instruction provided by PowerPoint, verbal discussion, and written material to support subject matter. There is an overview of "What is Oxygen" and "Why do we need it".  Instructor also reviews how to create a safe environment for oxygen use, the importance of using oxygen as prescribed, and the risks of noncompliance. There is a brief discussion on traveling with oxygen and resources the patient may utilize.   PULMONARY REHAB OTHER RESPIRATORY from 12/08/2016 in [place]  Date 09/08/16  Educator Truddie Crumble  Instruction Review Code (Retired) 2- meets goals/outcomes      Oxygen Equipment:  -Group instruction provided by [employer], Network engineer, and Insurance underwriter.   PULMONARY REHAB OTHER RESPIRATORY from 12/08/2016 in [place]  Date 10/27/16  Educator George/[employer]  Instruction Review Code (Retired) 2- meets goals/outcomes      Signs and Symptoms:  -Group instruction provided by written material and verbal discussion to support subject matter. Warning signs and symptoms of infection, stroke, and heart attack are reviewed and when to call the physician/911 reinforced. Tips for preventing the spread of infection discussed.   PULMONARY REHAB OTHER RESPIRATORY from 12/08/2016 in [place]  Date  10/06/16  Educator rn  Instruction Review Code (Retired) 2- meets goals/outcomes      Advanced Directives:  -Group instruction provided by verbal instruction and written material to support subject matter. Instructor reviews Advanced Directive laws and proper instruction for  filling out document.   Pulmonary Video:  -Group video education that reviews the importance of medication and oxygen compliance, exercise, good nutrition, pulmonary hygiene, and pursed lip and diaphragmatic breathing for the pulmonary patient.   PULMONARY REHAB OTHER RESPIRATORY from 12/08/2016 in [place]  Date 09/15/16  Instruction Review Code (Retired) R- Review/reinforce      Exercise for the Pulmonary Patient:  -Group instruction that is supported by a PowerPoint presentation. Instructor discusses benefits of exercise, core components of exercise, frequency, duration, and intensity of an exercise routine, importance of utilizing pulse oximetry during exercise, safety while exercising, and options of places to exercise outside of rehab.     PULMONARY REHAB OTHER RESPIRATORY from 12/08/2016 in [place]  Date 09/22/16  Educator EP  Instruction Review Code (Retired) 2- meets goals/outcomes      Pulmonary Medications:  -Verbally interactive group education provided by Art therapist with focus on inhaled medications and proper administration.   PULMONARY REHAB OTHER RESPIRATORY from 12/08/2016 in [place]  Date 09/01/16  Educator pharm  Instruction Review Code (Retired) 2- Lawyer and Physiology of the Respiratory System and Intimacy:  -Group instruction provided by PowerPoint, verbal discussion, and written material to support subject matter. Instructor reviews respiratory cycle and anatomical components of the respiratory system and their functions. Instructor also reviews differences in  obstructive and restrictive respiratory diseases with examples of each. Intimacy, Sex, and Sexuality differences are reviewed with a discussion on how relationships can change when diagnosed with pulmonary disease. Common sexual concerns are reviewed.   PULMONARY REHAB OTHER RESPIRATORY from 12/08/2016 in [place]  Date 08/11/16  Educator RN  Instruction Review Code (Retired) 2- meets goals/outcomes      MD DAY -A group question and answer session with a medical doctor that allows participants to ask questions that relate to their pulmonary disease state.   PULMONARY REHAB OTHER RESPIRATORY from 12/08/2016 in [place]  Date 12/08/16  Educator yacoub  Instruction Review Code (Retired) 2- meets goals/outcomes      OTHER EDUCATION -Group or individual verbal, written, or video instructions that support the educational goals of the pulmonary rehab program.   Holiday Eating Survival Tips:  -Group instruction provided by PowerPoint slides, verbal discussion, and written materials to support subject matter. The instructor gives patients tips, tricks, and techniques to help them not only survive but enjoy the holidays despite the onslaught of food that accompanies the holidays.   Knowledge Questionnaire Score:   Core Components/Risk Factors/Patient Goals at Admission:  Personal Goals and Risk Factors at Admission - 01/14/20 1512      Core Components/Risk Factors/Patient Goals on Admission    Weight Management Weight Loss   not a priority for this pt due to her age, but okay if lost weight   Improve shortness of breath with ADL's Yes    Intervention Provide education, individualized exercise plan and daily activity instruction to help decrease symptoms of SOB with activities of daily living.    Expected Outcomes Short Term: Improve cardiorespiratory fitness to achieve a reduction of symptoms when performing ADLs;Long Term: Be  able to perform more ADLs without symptoms or delay the onset of symptoms    Personal Goal Other Yes    Personal Goal My granddaughter who is 50 graduating from high school this spring.  Grandchild due in October and  February.  I would like to be able to see these events with less shortness of breath, increase energy and stamina.  Pt would like to be independent and not have to live in a nursing home.    Intervention continue with consistent attendance to pulmonary rehab as well as home exercise dilgent with keeping activie on off days from rehab           Core Components/Risk Factors/Patient Goals Review:   Goals and Risk Factor Review    Row Name 01/14/20 1517             Core Components/Risk Factors/Patient Goals Review   Personal Goals Review --              Core Components/Risk Factors/Patient Goals at Discharge (Final Review):   Goals and Risk Factor Review - 01/14/20 1517      Core Components/Risk Factors/Patient Goals Review   Personal Goals Review --           ITP Comments:  ITP Comments    Row Name 01/14/20 1439           ITP Comments Dr. Loanne Drilling, Medical Director for [facility]              Comments:

## 2020-01-17 ENCOUNTER — Encounter (HOSPITAL_COMMUNITY): Payer: Self-pay | Admitting: *Deleted

## 2020-01-20 ENCOUNTER — Telehealth: Payer: Self-pay | Admitting: Internal Medicine

## 2020-01-20 NOTE — Telephone Encounter (Signed)
Patient calling to report she is cancelling her cardiac cath because she is afraid of covid. FYI

## 2020-01-21 ENCOUNTER — Encounter (HOSPITAL_COMMUNITY)
Admission: RE | Admit: 2020-01-21 | Discharge: 2020-01-21 | Disposition: A | Discharge status: Home / Self Care | Payer: Medicare Other | Source: Ambulatory Visit | Attending: Internal Medicine | Admitting: Internal Medicine

## 2020-01-21 ENCOUNTER — Other Ambulatory Visit: Payer: Self-pay

## 2020-01-21 DIAGNOSIS — J849 Interstitial pulmonary disease, unspecified: Secondary | ICD-10-CM | POA: Diagnosis not present

## 2020-01-21 NOTE — Progress Notes (Signed)
Daily Session Note  Patient Details  Name: [name] MRN: [id number] Date of Birth: [birth year] Referring Provider:     Pulmonary Rehab Walk Test from 01/14/2020 in [place]  Referring Provider Dr. Chase Caller      Encounter Date: 01/21/2020  Check In:  Session Check In - 01/21/20 1331      Check-In   Supervising physician immediately available to respond to emergencies [facility] immediately available    Physician(s) Dr. Claybon Jabs    Location [facility]    Staff Present Maurice Small, RN, Bjorn Loser, MS, CEP, Exercise Physiologist;Lisa Jani Gravel, MS, ACSM-CEP, Exercise Physiologist    Virtual Visit No    Medication changes reported     No    Fall or balance concerns reported    No    Tobacco Cessation No Change    Warm-up and Cool-down Performed on first and last piece of equipment    Resistance Training Performed Yes    VAD Patient? No    PAD/SET Patient? No      Pain Assessment   Currently in Pain? No/denies    Multiple Pain Sites No           Capillary Blood Glucose: No results found for this or any previous visit (from the past 24 hour(s)).    Social History   Tobacco Use  Smoking Status Never Smoker  Smokeless Tobacco Never Used    Goals Met:  Proper associated with RPD/PD & O2 Sat Exercise tolerated well Strength training completed today  Goals Unmet:  Not Applicable  Comments: Service time is from 1300 to [place]    Dr. Fransico Him is Medical Director for Cardiac Rehab at [facility].

## 2020-01-21 NOTE — Telephone Encounter (Signed)
Ok to postpone it by a month or so

## 2020-01-22 NOTE — Telephone Encounter (Signed)
Called and spoke with patient about postponing cath procedure at this time for about a month or so. States she broke off a tooth and has an infection from it and they have given her an implant and she just feels like she needs to wait to have it done till all of this is better and taken care of. Informed her that Dr. Chase Caller is ok with this decision. She will come in on 9/30 for breathing test and then will need to be scheduled for follow up. Patient will call back at later date to schedule. Nothing further needed at this time

## 2020-01-23 ENCOUNTER — Telehealth (HOSPITAL_COMMUNITY): Payer: Self-pay | Admitting: Family Medicine

## 2020-01-23 ENCOUNTER — Encounter (HOSPITAL_COMMUNITY): Payer: Medicare Other

## 2020-01-24 ENCOUNTER — Other Ambulatory Visit (HOSPITAL_COMMUNITY): Payer: Medicare Other

## 2020-01-27 ENCOUNTER — Encounter (HOSPITAL_COMMUNITY): Payer: Self-pay

## 2020-01-27 ENCOUNTER — Ambulatory Visit (HOSPITAL_COMMUNITY): Admit: 2020-01-27 | Payer: Medicare Other | Admitting: Internal Medicine

## 2020-01-27 SURGERY — RIGHT HEART CATH
Anesthesia: LOCAL

## 2020-01-28 ENCOUNTER — Other Ambulatory Visit: Payer: Self-pay

## 2020-01-28 ENCOUNTER — Encounter (HOSPITAL_COMMUNITY)
Admission: RE | Admit: 2020-01-28 | Discharge: 2020-01-28 | Disposition: A | Discharge status: Home / Self Care | Payer: Medicare Other | Source: Ambulatory Visit | Attending: Internal Medicine | Admitting: Internal Medicine

## 2020-01-28 VITALS — Wt 219.1 lb

## 2020-01-28 DIAGNOSIS — J849 Interstitial pulmonary disease, unspecified: Secondary | ICD-10-CM | POA: Diagnosis not present

## 2020-01-28 NOTE — Progress Notes (Signed)
Daily Session Note  Patient Details  Name: [name] MRN: [id number] Date of Birth: [birth year] Referring Provider:     Pulmonary Rehab Walk Test from 01/14/2020 in [place]  Referring Provider Dr. Chase Caller      Encounter Date: 01/28/2020  Check In:  Session Check In - 01/28/20 1449      Check-In   Supervising physician immediately available to respond to emergencies [facility] immediately available    Physician(s) Dr. Philis Pique    [facility]    Staff Present Maurice Small, RN, Bjorn Loser, MS, CEP, Exercise Physiologist;Lisa Jani Gravel, MS, ACSM-CEP, Exercise Physiologist    Virtual Visit No    Medication changes reported     No    Fall or balance concerns reported    No    Tobacco Cessation No Change    Warm-up and Cool-down Performed on first and last piece of equipment    Resistance Training Performed Yes    VAD Patient? No    PAD/SET Patient? No      Pain Assessment   Currently in Pain? No/denies    Pain Score 0-No pain    Multiple Pain Sites No           Capillary Blood Glucose: No results found for this or any previous visit (from the past 24 hour(s)).   Exercise Prescription Changes - 01/28/20 1500      Response to Exercise   Blood Pressure (Admit) 160/70    Blood Pressure (Exercise) 150/78    Blood Pressure (Exit) 132/62    Heart Rate (Admit) 92 bpm    Heart Rate (Exercise) 98 bpm    Heart Rate (Exit) 97 bpm    Oxygen Saturation (Admit) 98 %    Oxygen Saturation (Exercise) 97 %    Oxygen Saturation (Exit) 97 %    Rating of Perceived Exertion (Exercise) 12    Perceived Dyspnea (Exercise) 1    Duration Continue with 30 min of aerobic exercise without signs/symptoms of physical distress.    Intensity THRR unchanged      Progression   Progression Continue to progress workloads to maintain intensity without signs/symptoms of physical distress.    Average  METs 1.5      Resistance Training   Training Prescription Yes    Weight orange bands    Reps 10-15    Time 10 Minutes      Oxygen   Oxygen Continuous    Liters 4      NuStep   Level 2    SPM 80    Minutes 30           Social History   Tobacco Use  Smoking Status Never Smoker  Smokeless Tobacco Never Used    Goals Met:  Proper associated with RPD/PD & O2 Sat Exercise tolerated well No report of cardiac concerns or symptoms Strength training completed today  Goals Unmet:  Not Applicable  Comments: Service time is from 1300 to 1430    Dr. Fransico Him is Medical Director for Cardiac Rehab at [facility].

## 2020-01-28 NOTE — Progress Notes (Signed)
[name] 79 y.o. female Nutrition Note  Visit Diagnosis: ILD (interstitial lung disease) ([place])  Past Medical History:  Diagnosis Date  . Allergic rhinitis    takes Claritin nightly  . Anxiety    but not on any meds  . Arthritis   . Asthma   . Carpal tunnel syndrome   . CHF (congestive heart failure) ([place])    patient denies-in office note 05/2015 by Aleene Davidson  . GERD (gastroesophageal reflux disease)   . History of bronchitis 3-55yr ago  . History of colon polyps    benign  . Joint pain   . Peripheral edema    takes Lasix daily  . [place]onitis, [place]   chronic hypersensitivity pneumonitis- Dr. MSpero Curb . [place]   patient denies-in office note from Dr. MSpero Curb  . Shortness of breath dyspnea    with exertion;Albuterol inhaler as needed  . Thyroid nodule      Medications reviewed.   Current Outpatient Medications:  .  albuterol (VENTOLIN HFA) 108 (90 Base) MCG/ACT inhaler, Inhale 2 puffs into the lungs every 6 (six) hours as needed for wheezing or shortness of breath., Disp: 1 Inhaler, Rfl: 2 .  aspirin 325 MG tablet, Take 650 mg by mouth daily as needed for moderate pain or headache., Disp: , Rfl:  .  Cholecalciferol (DIALYVITE VITAMIN D 5000) 125 MCG (5000 UT) capsule, Take 5,000 Units by mouth daily., Disp: , Rfl:  .  fluticasone (FLONASE) 50 MCG/ACT nasal spray, Place 1 spray into both nostrils daily as needed for allergies. , Disp: , Rfl:  .  ibuprofen (ADVIL) 200 MG tablet, Take 200-400 mg by mouth every 6 (six) hours as needed for headache or moderate pain., Disp: , Rfl:  .  loratadine (CLARITIN) 5 MG chewable tablet, Chew 5-10 mg by mouth daily as needed for allergies. , Disp: , Rfl:  .  Multiple Vitamin (MULTIVITAMIN WITH MINERALS) TABS tablet, Take 1 tablet by mouth daily., Disp: , Rfl:  .  Nintedanib (OFEV) 150 MG CAPS, Take 1 capsule (150 mg total) by mouth 2 (two) times daily., Disp: 60  capsule, Rfl: 5 .  Respiratory Therapy Supplies (FLUTTER) DEVI, Use as directed., Disp: 1 each, Rfl: 0 .  sodium chloride HYPERTONIC 3 % nebulizer solution, USE CONTENTS OF 1 VIAL IN NEBULIZER 2 TIMES A DAY. (Patient taking differently: Take 4 mLs by nebulization daily. ), Disp: 480 mL, Rfl: 0 .  vitamin B-12 (CYANOCOBALAMIN) 1000 MCG tablet, Take 1,000 mcg by mouth every other day. , Disp: , Rfl:    Ht Readings from Last 1 Encounters:  12/03/19 _0  (1.702 m)     Wt Readings from Last 3 Encounters:  12/03/19 217 lb (98.4 kg)  10/03/19 222 lb 9.6 oz (101 kg)  07/29/19 218 lb 12.8 oz (99.2 kg)     There is no height or weight on file to calculate BMI.   Social History   Tobacco Use  Smoking Status Never Smoker  Smokeless Tobacco Never Used    Nutrition Note  Spoke with pt. Nutrition Plan and Nutrition Survey goals reviewed with pt.   Pt lives alone and prepares food for herself. No issues with preparation. She keeps O2 at 3 liters for these activities.  She always takes OFEV with food including protein. No significant GI side effects.  Noticeable fatigue in the morning. She feels poorly that she can't be productive. Reassured pt this is normal with ILD. Reviewed ways to reduce  fatigue. She typically eats small meals throughout day. Reviewed benefit of this. She does report SOB when she eats large meals.  Her appetite is normal. She eats a variety of foods including veggies, fruits, and whole grains. She does keep convenience foods (yogurts, cheese, eggs, bread, milk, and frozen meals) on hand for days when she feels more tired.  She has good family support from 2 children.   Pt expressed understanding of the information reviewed.   Nutrition Diagnosis ? Food-and nutrition-related knowledge deficit related to lack of exposure to information as related to diagnosis of: ? ILD  Nutrition Intervention ? Pt's individual nutrition plan reviewed with pt. ? Benefits of adopting  healthy diet reviewed with Rate My Plate survey ? Continue client-centered nutrition education by RD, as part of interdisciplinary care.  Goal(s) ? Pt to build a healthy plate including vegetables, fruits, whole grains, and low-fat dairy products in a healthy meal plan. ? Pt to continue to eat 5-6 small meals throughout day ? Pt to eat main meal later in the afternoon when she has the most energy ? Pt to continue to eat protein rich snack with OFEV  Plan:   Will provide client-centered nutrition education as part of interdisciplinary care  Monitor and evaluate progress toward nutrition goal with team.   Michaele Offer, MS, RDN, LDN

## 2020-01-30 ENCOUNTER — Other Ambulatory Visit: Payer: Self-pay

## 2020-01-30 ENCOUNTER — Encounter (HOSPITAL_COMMUNITY)
Admission: RE | Admit: 2020-01-30 | Discharge: 2020-01-30 | Disposition: A | Discharge status: Home / Self Care | Payer: Medicare Other | Source: Ambulatory Visit | Attending: Internal Medicine | Admitting: Internal Medicine

## 2020-01-30 DIAGNOSIS — J849 Interstitial pulmonary disease, unspecified: Secondary | ICD-10-CM

## 2020-01-30 NOTE — Progress Notes (Signed)
Daily Session Note  Patient Details  Name: [name] MRN: [id number] Date of Birth: [birth year] Referring Provider:     Pulmonary Rehab Walk Test from 01/14/2020 in [place]  Referring Provider Dr. Chase Caller      Encounter Date: 01/30/2020  Check In:  Session Check In - 01/30/20 1436      Check-In   Supervising physician immediately available to respond to emergencies [facility] immediately available    Physician(s) Dr. Gala Lewandowsky    [facility]    Staff Present Maurice Small, RN, Bjorn Loser, MS, CEP, Exercise Physiologist;Lisa Jani Gravel, MS, ACSM-CEP, Exercise Physiologist    Virtual Visit No    Medication changes reported     No    Fall or balance concerns reported    No    Tobacco Cessation No Change    Warm-up and Cool-down Performed on first and last piece of equipment    Resistance Training Performed Yes    VAD Patient? No    PAD/SET Patient? No      Pain Assessment   Currently in Pain? No/denies    Pain Score 0-No pain    Multiple Pain Sites No           Capillary Blood Glucose: No results found for this or any previous visit (from the past 24 hour(s)).    Social History   Tobacco Use  Smoking Status Never Smoker  Smokeless Tobacco Never Used    Goals Met:  Proper associated with RPD/PD & O2 Sat Exercise tolerated well No report of cardiac concerns or symptoms Strength training completed today  Goals Unmet:  Not Applicable  Comments: Service time is from 1330 to 1435    Dr. Fransico Him is Medical Director for Cardiac Rehab at [facility]

## 2020-02-04 ENCOUNTER — Other Ambulatory Visit: Payer: Self-pay

## 2020-02-04 ENCOUNTER — Encounter (HOSPITAL_COMMUNITY)
Admission: RE | Admit: 2020-02-04 | Discharge: 2020-02-04 | Disposition: A | Discharge status: Home / Self Care | Payer: Medicare Other | Source: Ambulatory Visit | Attending: Internal Medicine | Admitting: Internal Medicine

## 2020-02-04 DIAGNOSIS — J849 Interstitial pulmonary disease, unspecified: Secondary | ICD-10-CM

## 2020-02-04 NOTE — Progress Notes (Signed)
Daily Session Note  Patient Details  Name: [name] MRN: [id number] Date of Birth: 1940/10/05 Referring Provider:     Pulmonary Rehab Walk Test from 01/14/2020 in [place]  Referring Provider Dr. Chase Caller      Encounter Date: 02/04/2020  Check In:  Session Check In - 02/04/20 1342      Check-In   Supervising physician immediately available to respond to emergencies [facility] immediately available    Physician(s) Dr. Gala Lewandowsky    [facility]    Staff Present Maurice Small, RN, Bjorn Loser, MS, CEP, Exercise Physiologist;Leylah Tarnow Jani Gravel, MS, ACSM-CEP, Exercise Physiologist    Virtual Visit No    Medication changes reported     No    Fall or balance concerns reported    No    Tobacco Cessation No Change    Warm-up and Cool-down Performed on first and last piece of equipment    Resistance Training Performed Yes    VAD Patient? No    PAD/SET Patient? No      Pain Assessment   Currently in Pain? No/denies    Pain Score 0-No pain    Multiple Pain Sites No           Capillary Blood Glucose: No results found for this or any previous visit (from the past 24 hour(s)).    Social History   Tobacco Use  Smoking Status Never Smoker  Smokeless Tobacco Never Used    Goals Met:  Exercise tolerated well No report of cardiac concerns or symptoms Strength training completed today  Goals Unmet:  Not Applicable  Comments: Service time is from 1310 to 1425    Dr. Fransico Him is Medical Director for Cardiac Rehab at [facility].

## 2020-02-06 ENCOUNTER — Other Ambulatory Visit: Payer: Self-pay

## 2020-02-06 ENCOUNTER — Encounter (HOSPITAL_COMMUNITY)
Admission: RE | Admit: 2020-02-06 | Discharge: 2020-02-06 | Disposition: A | Discharge status: Home / Self Care | Payer: Medicare Other | Source: Ambulatory Visit | Attending: Internal Medicine | Admitting: Internal Medicine

## 2020-02-06 DIAGNOSIS — J849 Interstitial pulmonary disease, unspecified: Secondary | ICD-10-CM | POA: Insufficient documentation

## 2020-02-06 NOTE — Progress Notes (Signed)
Daily Session Note  Patient Details  Name: [name] MRN: [id number] Date of Birth: 01/19/1941 Referring Provider:     Pulmonary Rehab Walk Test from 01/14/2020 in [place]  Referring Provider Dr. Chase Caller      Encounter Date: 02/06/2020  Check In:  Session Check In - 02/06/20 1529      Check-In   Supervising physician immediately available to respond to emergencies [facility] immediately available    Physician(s) Dr. Cathlean Sauer    [facility]    Staff Present Hoy Register, MS, CEP, Exercise Physiologist;Kamir Selover Ysidro Evert, RN;David Makemson, MS, EP-C, CCRP    Virtual Visit No    Medication changes reported     No    Fall or balance concerns reported    No    Tobacco Cessation No Change    Warm-up and Cool-down Performed on first and last piece of equipment    Resistance Training Performed Yes    VAD Patient? No    PAD/SET Patient? No      Pain Assessment   Currently in Pain? No/denies    Multiple Pain Sites No           Capillary Blood Glucose: No results found for this or any previous visit (from the past 24 hour(s)).    Social History   Tobacco Use  Smoking Status Never Smoker  Smokeless Tobacco Never Used    Goals Met:  Exercise tolerated well No report of cardiac concerns or symptoms Strength training completed today  Goals Unmet:  Not Applicable  Comments: Service time is from 1305 to 1414     Dr. Fransico Him is Medical Director for Cardiac Rehab at [facility].

## 2020-02-11 ENCOUNTER — Other Ambulatory Visit: Payer: Self-pay

## 2020-02-11 ENCOUNTER — Encounter (HOSPITAL_COMMUNITY)
Admission: RE | Admit: 2020-02-11 | Discharge: 2020-02-11 | Disposition: A | Discharge status: Home / Self Care | Payer: Medicare Other | Source: Ambulatory Visit | Attending: Internal Medicine | Admitting: Internal Medicine

## 2020-02-11 VITALS — Wt 216.9 lb

## 2020-02-11 DIAGNOSIS — J849 Interstitial pulmonary disease, unspecified: Secondary | ICD-10-CM | POA: Diagnosis not present

## 2020-02-11 NOTE — Progress Notes (Signed)
Daily Session Note  Patient Details  Name: Tascha G Millette MRN: [id number] Date of Birth: 11/04/1940 Referring Provider:     Pulmonary Rehab Walk Test from 01/14/2020 in [facility]  Referring Provider Dr. Ramaswamy      Encounter Date: 02/11/2020  Check In:  Session Check In - 02/11/20 1455      Check-In   Supervising physician immediately available to respond to emergencies [facility] immediately available    Physician(s) Dr. Arrien    [facility]    Staff Present Carlette Carlton, RN, BSN;Lisa Hughes, RN;David Makemson, MS, EP-C, CCRP;Jessica Martin, MS, ACSM-CEP, Exercise Physiologist    Virtual Visit No    Medication changes reported     No    Fall or balance concerns reported    No    Tobacco Cessation No Change    Warm-up and Cool-down Performed on first and last piece of equipment    Resistance Training Performed Yes    VAD Patient? No    PAD/SET Patient? No      Pain Assessment   Currently in Pain? No/denies    Pain Score 0-No pain    Multiple Pain Sites No           Capillary Blood Glucose: No results found for this or any previous visit (from the past 24 hour(s)).   Exercise Prescription Changes - 02/11/20 1500      Response to Exercise   Blood Pressure (Admit) 142/70    Blood Pressure (Exercise) 150/80    Blood Pressure (Exit) 166/80   rechecked 134/70   Heart Rate (Admit) 96 bpm    Heart Rate (Exercise) 122 bpm    Heart Rate (Exit) 97 bpm    Oxygen Saturation (Admit) 97 %    Oxygen Saturation (Exercise) 88 %    Oxygen Saturation (Exit) 97 %    Rating of Perceived Exertion (Exercise) 13    Perceived Dyspnea (Exercise) 2    Duration Continue with 30 min of aerobic exercise without signs/symptoms of physical distress.    Intensity THRR unchanged      Progression   Progression Continue to progress workloads to maintain intensity without signs/symptoms of physical distress.       Resistance Training   Training Prescription Yes    Weight orange bands    Reps 10-15    Time 10 Minutes      Oxygen   Oxygen Continuous    Liters 4      NuStep   Level 2    SPM 80    Minutes 30    METs 1.6           Social History   Tobacco Use  Smoking Status Never Smoker  Smokeless Tobacco Never Used    Goals Met:  Exercise tolerated well No report of cardiac concerns or symptoms Strength training completed today  Goals Unmet:  Not Applicable  Comments: Service time is from 1305 to 1420    Dr. Traci Turner is Medical Director for Cardiac Rehab at [facility]. 

## 2020-02-12 ENCOUNTER — Encounter (HOSPITAL_COMMUNITY): Payer: Self-pay

## 2020-02-12 ENCOUNTER — Other Ambulatory Visit: Payer: Self-pay | Admitting: Internal Medicine

## 2020-02-13 ENCOUNTER — Telehealth: Payer: Self-pay | Admitting: Internal Medicine

## 2020-02-13 ENCOUNTER — Other Ambulatory Visit: Payer: Self-pay

## 2020-02-13 ENCOUNTER — Encounter (HOSPITAL_COMMUNITY)
Admission: RE | Admit: 2020-02-13 | Discharge: 2020-02-13 | Disposition: A | Discharge status: Home / Self Care | Payer: Medicare Other | Source: Ambulatory Visit | Attending: Internal Medicine | Admitting: Internal Medicine

## 2020-02-13 DIAGNOSIS — J849 Interstitial pulmonary disease, unspecified: Secondary | ICD-10-CM

## 2020-02-13 NOTE — Progress Notes (Signed)
Daily Session Note  Patient Details  Name: [name] MRN: [id number] Date of Birth: 1941-05-05 Referring Provider:     Pulmonary Rehab Walk Test from 01/14/2020 in [place]  Referring Provider Dr. Chase Caller      Encounter Date: 02/13/2020  Check In:  Session Check In - 02/13/20 1347      Check-In   Supervising physician immediately available to respond to emergencies [facility] immediately available    Physician(s) Dr. Cathlean Sauer    [facility]    Staff Present Rodney Langton, RN;Jessica Hassell Done, MS, ACSM-CEP, Exercise Physiologist;Faizan Geraci Rollene Rotunda, RN, BSN    Virtual Visit No    Medication changes reported     No    Fall or balance concerns reported    No    Tobacco Cessation No Change    Warm-up and Cool-down Performed on first and last piece of equipment    Resistance Training Performed Yes    VAD Patient? No    PAD/SET Patient? No      Pain Assessment   Currently in Pain? No/denies    Pain Score 0-No pain    Multiple Pain Sites No           Capillary Blood Glucose: No results found for this or any previous visit (from the past 24 hour(s)).    Social History   Tobacco Use  Smoking Status Never Smoker  Smokeless Tobacco Never Used    Goals Met:  Proper associated with RPD/PD & O2 Sat Exercise tolerated well Strength training completed today  Goals Unmet:  Not Applicable  Comments: Service time is from 1310 to [place]   Dr. Fransico Him is Medical Director for Cardiac Rehab at [facility].

## 2020-02-13 NOTE — Telephone Encounter (Signed)
Spoke with patient regarding prior message. would like to get a steroid for her arthritis or Tramadolcalled in [place] on [address] and [employer].Patrient stated she is on OFEV and cant take some medications  While on the medication.   Dr.Ramaswamy can you please advise.  Thank you

## 2020-02-13 NOTE — Progress Notes (Signed)
Pulmonary Individual Treatment Plan  Patient Details  Name: [name] MRN: [id number] Date of Birth: June 13, 1940 Referring Provider:     Pulmonary Rehab Walk Test from 01/14/2020 in [place]  Referring Provider Dr. Chase Caller      Initial Encounter Date:    Pulmonary Rehab Walk Test from 01/14/2020 in [place]  Date 01/14/20      Visit Diagnosis: ILD (interstitial lung disease) ([place])  Patient's Home Medications on Admission:   Current Outpatient Medications:  .  albuterol (VENTOLIN HFA) 108 (90 Base) MCG/ACT inhaler, Inhale 2 puffs into the lungs every 6 (six) hours as needed for wheezing or shortness of breath., Disp: 1 Inhaler, Rfl: 2 .  aspirin 325 MG tablet, Take 650 mg by mouth daily as needed for moderate pain or headache., Disp: , Rfl:  .  Cholecalciferol (DIALYVITE VITAMIN D 5000) 125 MCG (5000 UT) capsule, Take 5,000 Units by mouth daily., Disp: , Rfl:  .  fluticasone (FLONASE) 50 MCG/ACT nasal spray, Place 1 spray into both nostrils daily as needed for allergies. , Disp: , Rfl:  .  ibuprofen (ADVIL) 200 MG tablet, Take 200-400 mg by mouth every 6 (six) hours as needed for headache or moderate pain., Disp: , Rfl:  .  loratadine (CLARITIN) 5 MG chewable tablet, Chew 5-10 mg by mouth daily as needed for allergies. , Disp: , Rfl:  .  Multiple Vitamin (MULTIVITAMIN WITH MINERALS) TABS tablet, Take 1 tablet by mouth daily., Disp: , Rfl:  .  OFEV 150 MG CAPS, TAKE 1 CAPSULE (150 MG TOTAL) BY MOUTH 2 (TWO) TIMES DAILY., Disp: 60 capsule, Rfl: 2 .  Respiratory Therapy Supplies (FLUTTER) DEVI, Use as directed., Disp: 1 each, Rfl: 0 .  sodium chloride HYPERTONIC 3 % nebulizer solution, USE CONTENTS OF 1 VIAL IN NEBULIZER 2 TIMES A DAY. (Patient taking differently: Take 4 mLs by nebulization daily. ), Disp: 480 mL, Rfl: 0 .  vitamin B-12 (CYANOCOBALAMIN) 1000 MCG tablet, Take 1,000 mcg by mouth every other day. ,  Disp: , Rfl:   Past Medical History: Past Medical History:  Diagnosis Date  . Allergic rhinitis    takes Claritin nightly  . Anxiety    but not on any meds  . Arthritis   . Asthma   . Carpal tunnel syndrome   . CHF (congestive heart failure) ([place])    patient denies-in office note 05/2015 by Aleene Davidson  . GERD (gastroesophageal reflux disease)   . History of bronchitis 3-56yr ago  . History of colon polyps    benign  . Joint pain   . Peripheral edema    takes Lasix daily  . Pneumonitis, hypersensitivity ([facility] [place] 05/2016   chronic hypersensitivity pneumonitis- Dr. MSpero Curb . Pulmonary hypertension ([place] 06/30/2017   patient denies-in office note from Dr. MSpero Curb  . Shortness of breath dyspnea    with exertion;Albuterol inhaler as needed  . Thyroid nodule     Tobacco Use: Social History   Tobacco Use  Smoking Status Never Smoker  Smokeless Tobacco Never Used    Labs: Recent Review Flowsheet Data    Labs for ITP Cardiac and Pulmonary Rehab Latest Ref Rng & Units 11/18/2014 11/21/2014 06/07/2018   PHART 7.35 - 7.45 7.426 7.356 7.412   PCO2ART 32 - 48 mmHg 40.7 49.1(H) 43.1   HCO3 20.0 - 28.0 mmol/L 26.3(H) 26.8(H) 26.9   TCO2 0 - 100 mmol/L 27.5 28.4 -   O2SAT % 96.8 96.2  99.8      Capillary Blood Glucose: Lab Results  Component Value Date   GLUCAP 135 (H) 06/08/2018   GLUCAP 142 (H) 06/08/2018   GLUCAP 131 (H) 06/08/2018     Pulmonary Assessment Scores:  Pulmonary Assessment Scores    Row Name 01/14/20 1634         ADL UCSD   ADL Phase Entry       mMRC Score   mMRC Score 3           UCSD: Self-administered rating of dyspnea associated with activities of daily living (ADLs) 6-point scale (0 = "not at all" to 5 = "maximal or unable to do because of breathlessness")  Scoring Scores range from 0 to 120.  Minimally important difference is 5 units  CAT: CAT can identify the health impairment of COPD patients and is better correlated with disease  progression.  CAT has a scoring range of zero to 40. The CAT score is classified into four groups of low (less than 10), medium (10 - 20), high (21-30) and very high (31-40) based on the impact level of disease on health status. A CAT score over 10 suggests significant symptoms.  A worsening CAT score could be explained by an exacerbation, poor medication adherence, poor inhaler technique, or progression of COPD or comorbid conditions.  CAT MCID is 2 points  mMRC: mMRC (Modified Medical Research Council) Dyspnea Scale is used to assess the degree of baseline functional disability in patients of respiratory disease due to dyspnea. No minimal important difference is established. A decrease in score of 1 point or greater is considered a positive change.   Pulmonary Function Assessment:   Exercise Target Goals: Exercise Program Goal: Individual exercise prescription set using results from initial 6 min walk test and THRR while considering  patient's activity barriers and safety.   Exercise Prescription Goal: Initial exercise prescription builds to 30-45 minutes a day of aerobic activity, 2-3 days per week.  Home exercise guidelines will be given to patient during program as part of exercise prescription that the participant will acknowledge.  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 HR 130 bpm     Max Ex. BP 180/82     2 Minute Post BP 156/84       Interval  HR   1 Minute HR 110     2 Minute HR 123     3 Minute HR 125     4 Minute HR 128     5 Minute HR 128     6 Minute HR 130     2 Minute Post HR 92     Interval Heart Rate? Yes       Interval Oxygen   Interval Oxygen? Yes     Baseline Oxygen Saturation % 99 %     1 Minute Oxygen Saturation % 98 %     1 Minute Liters of Oxygen 4 L     2 Minute Oxygen Saturation % 93 %     2 Minute Liters of Oxygen 4 L     3 Minute Oxygen Saturation % 92 %     3 Minute Liters of Oxygen 4 L     4 Minute Oxygen Saturation % 92 %     4 Minute Liters of Oxygen 4 L     5 Minute Oxygen Saturation % 92 %     5 Minute Liters of Oxygen 4 L     6 Minute Oxygen Saturation % 92 %     6 Minute Liters of Oxygen 4 L     2 Minute Post Oxygen Saturation % 98 %     2 Minute Post Liters of Oxygen 4 L            Oxygen Initial Assessment:  Oxygen Initial Assessment - 01/14/20 1625      Initial 6 min Walk   Oxygen Used Continuous    Liters per minute 4      Program Oxygen Prescription   Program Oxygen Prescription Continuous    Liters per minute 4      Intervention   Short Term Goals To learn and exhibit compliance with exercise, home and travel O2 prescription;To learn and understand importance of monitoring SPO2 with pulse oximeter and demonstrate accurate use of the pulse oximeter.;To learn and demonstrate proper pursed lip breathing techniques or other breathing techniques.;To learn and demonstrate proper use of respiratory medications;To learn and understand importance of maintaining oxygen saturations>88%    Long  Term Goals Exhibits compliance with exercise, home and travel O2 prescription;Verbalizes importance of  monitoring SPO2 with pulse oximeter and return demonstration;Maintenance of O2 saturations>88%;Exhibits proper breathing techniques, such as pursed lip breathing or other method taught during program session;Compliance with respiratory medication;Demonstrates proper use of MDI's           Oxygen Re-Evaluation:   Oxygen Discharge (Final Oxygen Re-Evaluation):   Initial Exercise Prescription:  Initial Exercise Prescription - 01/14/20 1600      Date of Initial Exercise RX and Referring Provider   Date 01/14/20    Referring Provider Dr. Chase Caller      Oxygen   Oxygen Continuous    Liters 4      NuStep   Level 2    SPM 80    Minutes 15      Track   Laps 10    Minutes 15      Prescription Details   Frequency (times per week) 2    Duration Progress to 30 minutes of continuous aerobic without signs/symptoms of physical distress      Intensity   THRR 40-80% of Max Heartrate 56-112    Ratings of Perceived Exertion 11-13    Perceived Dyspnea 0-4      Resistance Training   Training  Prescription Yes    Weight orange bands    Reps 10-15           Perform Capillary Blood Glucose checks as needed.  Exercise Prescription Changes:  Exercise Prescription Changes    Row Name 01/28/20 1500 02/11/20 1500           Response to Exercise   Blood Pressure (Admit) 160/70 142/70      Blood Pressure (Exercise) 150/78 150/80      Blood Pressure (Exit) 132/62 166/80  rechecked 134/70      Heart Rate (Admit) 92 bpm 96 bpm      Heart Rate (Exercise) 98 bpm 122 bpm      Heart Rate (Exit) 97 bpm 97 bpm      Oxygen Saturation (Admit) 98 % 97 %      Oxygen Saturation (Exercise) 97 % 88 %      Oxygen Saturation (Exit) 97 % 97 %      Rating of Perceived Exertion (Exercise) 12 13      Perceived Dyspnea (Exercise) 1 2      Duration Continue with 30 min of aerobic exercise without signs/symptoms of physical distress. Continue with 30 min of aerobic exercise without signs/symptoms of  physical distress.      Intensity THRR unchanged THRR unchanged        Progression   Progression Continue to progress workloads to maintain intensity without signs/symptoms of physical distress. Continue to progress workloads to maintain intensity without signs/symptoms of physical distress.      Average METs 1.5 --        Resistance Training   Training Prescription Yes Yes      Weight orange bands orange bands      Reps 10-15 10-15      Time 10 Minutes 10 Minutes        Oxygen   Oxygen Continuous Continuous      Liters 4 4        NuStep   Level 2 2      SPM 80 80      Minutes 30 30      METs -- 1.6             Exercise Comments:  Exercise Comments    Row Name 01/21/20 1315           Exercise Comments Pt completed her first day of exercise in pulmonary rehab today. She was able to tolerate exercise well with no complaints.              Exercise Goals and Review:  Exercise Goals    Row Name 01/14/20 1457 01/14/20 1637           Exercise Goals   Increase Physical Activity -- Yes      Intervention -- Provide advice, education, support and counseling about physical activity/exercise needs.;Develop an individualized exercise prescription for aerobic and resistive training based on initial evaluation findings, risk stratification, comorbidities and participant's personal goals.      Expected Outcomes -- Short Term: Attend rehab on a regular basis to increase amount of physical activity.;Long Term: Add in home exercise to make exercise part of routine and to increase amount of physical activity.;Long Term: Exercising regularly at least 3-5 days a week.      Increase Strength and Stamina Yes Yes      Intervention Provide advice, education, support and counseling about physical activity/exercise needs.;Develop an individualized exercise prescription for aerobic and resistive training based on  initial evaluation findings, risk stratification, comorbidities and participant's  personal goals. Provide advice, education, support and counseling about physical activity/exercise needs.;Develop an individualized exercise prescription for aerobic and resistive training based on initial evaluation findings, risk stratification, comorbidities and participant's personal goals.      Expected Outcomes Short Term: Increase workloads from initial exercise prescription for resistance, speed, and METs.;Short Term: Perform resistance training exercises routinely during rehab and add in resistance training at home;Long Term: Improve cardiorespiratory fitness, muscular endurance and strength as measured by increased METs and functional capacity (6MWT) Short Term: Increase workloads from initial exercise prescription for resistance, speed, and METs.;Short Term: Perform resistance training exercises routinely during rehab and add in resistance training at home;Long Term: Improve cardiorespiratory fitness, muscular endurance and strength as measured by increased METs and functional capacity (6MWT)      Able to understand and use rate of perceived exertion (RPE) scale Yes Yes      Intervention Provide education and explanation on how to use RPE scale Provide education and explanation on how to use RPE scale      Expected Outcomes Long Term:  Able to use RPE to guide intensity level when exercising independently;Short Term: Able to use RPE daily in rehab to express subjective intensity level Long Term:  Able to use RPE to guide intensity level when exercising independently;Short Term: Able to use RPE daily in rehab to express subjective intensity level      Able to understand and use Dyspnea scale Yes Yes      Intervention Provide education and explanation on how to use Dyspnea scale Provide education and explanation on how to use Dyspnea scale      Expected Outcomes Short Term: Able to use Dyspnea scale daily in rehab to express subjective sense of shortness of breath during exertion;Long Term: Able to  use Dyspnea scale to guide intensity level when exercising independently Short Term: Able to use Dyspnea scale daily in rehab to express subjective sense of shortness of breath during exertion;Long Term: Able to use Dyspnea scale to guide intensity level when exercising independently      Knowledge and understanding of Target Heart Rate Range (THRR) -- Yes      Intervention -- Provide education and explanation of THRR including how the numbers were predicted and where they are located for reference      Expected Outcomes -- Short Term: Able to state/look up THRR;Short Term: Able to use daily as guideline for intensity in rehab;Long Term: Able to use THRR to govern intensity when exercising independently      Understanding of Exercise Prescription Yes Yes      Intervention Provide education, explanation, and written materials on patient's individual exercise prescription Provide education, explanation, and written materials on patient's individual exercise prescription      Expected Outcomes Short Term: Able to explain program exercise prescription;Long Term: Able to explain home exercise prescription to exercise independently Short Term: Able to explain program exercise prescription;Long Term: Able to explain home exercise prescription to exercise independently             Exercise Goals Re-Evaluation :   Discharge Exercise Prescription (Final Exercise Prescription Changes):  Exercise Prescription Changes - 02/11/20 1500      Response to Exercise   Blood Pressure (Admit) 142/70    Blood Pressure (Exercise) 150/80    Blood Pressure (Exit) 166/80   rechecked 134/70   Heart Rate (Admit) 96 bpm    Heart Rate (Exercise) 122 bpm  Heart Rate (Exit) 97 bpm    Oxygen Saturation (Admit) 97 %    Oxygen Saturation (Exercise) 88 %    Oxygen Saturation (Exit) 97 %    Rating of Perceived Exertion (Exercise) 13    Perceived Dyspnea (Exercise) 2    Duration Continue with 30 min of aerobic exercise  without signs/symptoms of physical distress.    Intensity THRR unchanged      Progression   Progression Continue to progress workloads to maintain intensity without signs/symptoms of physical distress.      Resistance Training   Training Prescription Yes    Weight orange bands    Reps 10-15    Time 10 Minutes      Oxygen   Oxygen Continuous    Liters 4      NuStep   Level 2    SPM 80    Minutes 30    METs 1.6           Nutrition:  Target Goals: Understanding of nutrition guidelines, daily intake of sodium <1573m, cholesterol <2035m calories 30% from fat and 7% or less from saturated fats, daily to have 5 or more servings of fruits and vegetables.  Biometrics:  Pre Biometrics - 01/14/20 1638      Pre Biometrics   Grip Strength 36 kg            Nutrition Therapy Plan and Nutrition Goals:  Nutrition Therapy & Goals - 01/28/20 1427      Nutrition Therapy   Diet Therapeutic Lifestyle Changes      Personal Nutrition Goals   Nutrition Goal Pt to build a healthy plate including vegetables, fruits, whole grains, and low-fat dairy products in a healthy meal plan.    Personal Goal #2 Pt to continue to eat 5-6 small meals throughout day    Personal Goal #3 Pt to eat main meal later in the afternoon when she has the most energy    Personal Goal #4 Pt to continue to eat protein rich snack with [place] and counsel regarding individualized specific dietary modifications aiming towards targeted core components such as weight, hypertension, lipid management, diabetes, heart failure and other comorbidities.    Expected Outcomes Short Term Goal: Understand basic principles of dietary content, such as calories, fat, sodium, cholesterol and nutrients.           Nutrition Assessments:   Nutrition Goals Re-Evaluation:  Nutrition Goals Re-Evaluation    [place] 01/28/20 1427             Goals   Current Weight 217  lb (98.4 kg)              Nutrition Goals Discharge (Final Nutrition Goals Re-Evaluation):  Nutrition Goals Re-Evaluation - 01/28/20 1427      Goals   Current Weight 217 lb (98.4 kg)           Psychosocial: Target Goals: Acknowledge presence or absence of significant depression and/or stress, maximize coping skills, provide positive support system. Participant is able to verbalize types and ability to use techniques and skills needed for reducing stress and depression.  Initial Review & Psychosocial Screening:  Initial Psych Review & Screening - 01/14/20 1506      Initial Review   Current issues with None Identified      Family Dynamics   Comments son lives around the corner - supportive of [name].  Pt daughter lives about 1047  minutes awake.      Barriers   Psychosocial barriers to participate in program There are no identifiable barriers or psychosocial needs.      Screening Interventions   Interventions Encouraged to exercise           Quality of Life Scores:  Scores of 19 and below usually indicate a poorer quality of life in these areas.  A difference of  2-3 points is a clinically meaningful difference.  A difference of 2-3 points in the total score of the Quality of Life Index has been associated with significant improvement in overall quality of life, self-image, physical symptoms, and general health in studies assessing change in quality of life.  PHQ-9: Recent Review Flowsheet Data    Depression screen [facility] 2/9 01/14/2020 12/13/2016 07/18/2016 02/16/2015   Decreased Interest 0 0 0 0   Down, Depressed, Hopeless 0 0 0 0   PHQ - 2 Score 0 0 0 0   Altered sleeping 0 - - -   Tired, decreased energy 2 - - -   Change in appetite 1 - - -   Feeling bad or failure about yourself  1 - - -   Trouble concentrating 0 - - -   Moving slowly or fidgety/restless 0 - - -   Suicidal thoughts 0 - - -   PHQ-9 Score 4 - - -   Difficult doing work/chores Not difficult at all - - -      Interpretation of Total Score  Total Score Depression Severity:  1-4 = Minimal depression, 5-9 = Mild depression, 10-14 = Moderate depression, 15-19 = Moderately severe depression, 20-27 = Severe depression   Psychosocial Evaluation and Intervention:  Psychosocial Evaluation - 02/12/20 2201      Psychosocial Evaluation & Interventions   Interventions Encouraged to exercise with the program and follow exercise prescription;Stress management education;Relaxation education    Comments [name] denies any psychosocial barriers to participating in pulmoarny rehab    Expected Outcomes Pt will demonstrate appropriate and healthy coping skills with no reportable psychosocial barriers to participating    Continue Psychosocial Services  Follow up required by staff           Psychosocial Re-Evaluation:  Psychosocial Re-Evaluation    [place] Name 02/12/20 2202             Psychosocial Re-Evaluation   Current issues with None Identified       Comments [name] has completed 6 exercise sessions and although she has 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 she seemingly enjoys coming to exercisde and averages about 10-13 laps       Expected Outcomes patient will remain free from psycosocial barriers        Interventions Encouraged to attend Pulmonary Rehabilitation for the exercise;Relaxation education;Stress management education              Psychosocial Discharge (Final Psychosocial Re-Evaluation):  Psychosocial Re-Evaluation - 02/12/20 2202      Psychosocial Re-Evaluation   Current issues with None Identified    Comments [name] has completed 6 exercise sessions and although she has 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 Participant will state understanding/return demonstration of topics presented.  Learning Barriers/Preferences:  Learning Barriers/Preferences - 01/14/20 1509      Learning Barriers/Preferences   Learning Barriers Sight;Hearing   reader glasses; age appropriate hearing loss   Learning Preferences Group Instruction;Individual Instruction;Skilled Demonstration;Verbal Instruction;Written Material;Computer/Internet           Education Topics: Risk Factor Reduction:  -Group instruction that is supported by a PowerPoint presentation. Instructor discusses the definition of a risk factor, different risk factors for pulmonary disease, and how the heart and lungs work together.     PULMONARY REHAB OTHER RESPIRATORY from 12/08/2016 in [place]  Date 08/25/16  Educator EP  Instruction Review Code (Retired) 2- meets goals/outcomes      Nutrition for Pulmonary Patient:  -Group instruction provided by [employer], verbal discussion, and written materials to support subject matter. The instructor gives an explanation and review of healthy diet recommendations, which includes a discussion on weight management, recommendations for fruit and vegetable consumption, as well as protein, fluid, caffeine, fiber, sodium, sugar, and alcohol. Tips for eating when patients are short of breath are discussed.   PULMONARY REHAB OTHER RESPIRATORY from 02/11/2020 in [place]  Date 01/30/20  Educator Handout  Instruction Review Code 2- Demonstrated Understanding      Pursed Lip Breathing:  -Group instruction that is supported by demonstration and informational handouts. Instructor discusses the benefits of pursed lip and diaphragmatic breathing and detailed demonstration on how to preform both.      Oxygen Safety:  -Group instruction provided by PowerPoint, verbal discussion, and written material to support subject matter. There is an overview of "What is Oxygen" and "Why do we need it".  Instructor also reviews how to create a safe environment for oxygen use, the importance of using oxygen as prescribed, and the risks of noncompliance. There is a brief discussion on traveling with oxygen and resources the patient may utilize.   PULMONARY REHAB OTHER RESPIRATORY from 12/08/2016 in [place]  Date 09/08/16  Educator Truddie Crumble  Instruction Review Code (Retired) 2- meets goals/outcomes      Oxygen Equipment:  -Group instruction provided by [employer], Network engineer, and Insurance underwriter.   PULMONARY REHAB OTHER RESPIRATORY from 12/08/2016 in [place]  Date 10/27/16  Educator George[employer]  Instruction Review Code (Retired) 2- meets goals/outcomes      Signs and Symptoms:  -Group instruction provided by written material and verbal discussion to support subject matter. Warning signs and symptoms of infection, stroke, and heart attack are reviewed and when to call the physician/911 reinforced. Tips for preventing the spread of infection discussed.   PULMONARY REHAB OTHER RESPIRATORY from 12/08/2016 in [place]  Date 10/06/16  Educator rn  Instruction Review Code (Retired) 2- meets goals/outcomes      Advanced Directives:  -Group instruction provided by verbal instruction and written material to support subject matter. Instructor reviews Advanced Directive laws and proper instruction for filling out document.   Pulmonary Video:  -Group video education that reviews the importance of medication and oxygen compliance, exercise, good nutrition, pulmonary hygiene, and pursed lip and diaphragmatic breathing for the pulmonary patient.   PULMONARY REHAB OTHER  RESPIRATORY from 12/08/2016 in [place]  Date 09/15/16  Instruction Review Code (Retired) R- Review/reinforce      Exercise for the Pulmonary Patient:  -  Group instruction that is supported by a PowerPoint presentation. Instructor discusses benefits of exercise, core components of exercise, frequency, duration, and intensity of an exercise routine, importance of utilizing pulse oximetry during exercise, safety while exercising, and options of places to exercise outside of rehab.     PULMONARY REHAB OTHER RESPIRATORY from 12/08/2016 in [place]  Date 09/22/16  Educator EP  Instruction Review Code (Retired) 2- meets goals/outcomes      Pulmonary Medications:  -Verbally interactive group education provided by Art therapist with focus on inhaled medications and proper administration.   PULMONARY REHAB OTHER RESPIRATORY from 12/08/2016 in [place]  Date 09/01/16  Educator pharm  Instruction Review Code (Retired) 2- Lawyer and Physiology of the Respiratory System and Intimacy:  -Group instruction provided by PowerPoint, verbal discussion, and written material to support subject matter. Instructor reviews respiratory cycle and anatomical components of the respiratory system and their functions. Instructor also reviews differences in obstructive and restrictive respiratory diseases with examples of each. Intimacy, Sex, and Sexuality differences are reviewed with a discussion on how relationships can change when diagnosed with pulmonary disease. Common sexual concerns are reviewed.   PULMONARY REHAB OTHER RESPIRATORY from 12/08/2016 in [place]  Date 08/11/16  Educator RN  Instruction Review Code (Retired) 2- meets goals/outcomes      MD DAY -A group question and answer session with a medical doctor that allows participants to ask questions  that relate to their pulmonary disease state.   PULMONARY REHAB OTHER RESPIRATORY from 12/08/2016 in [place]  Date 12/08/16  Educator yacoub  Instruction Review Code (Retired) 2- meets goals/outcomes      OTHER EDUCATION -Group or individual verbal, written, or video instructions that support the educational goals of the pulmonary rehab program.   PULMONARY REHAB OTHER RESPIRATORY from 02/11/2020 in [place]  Date 02/11/20  Educator Handout  [METS]      Holiday Eating Survival Tips:  -Group instruction provided by PowerPoint slides, verbal discussion, and written materials to support subject matter. The instructor gives patients tips, tricks, and techniques to help them not only survive but enjoy the holidays despite the onslaught of food that accompanies the holidays.   Knowledge Questionnaire Score:   Core Components/Risk Factors/Patient Goals at Admission:  Personal Goals and Risk Factors at Admission - 01/14/20 1512      Core Components/Risk Factors/Patient Goals on Admission    Weight Management Weight Loss   not a priority for this pt due to her age, but okay if lost weight   Improve shortness of breath with ADL's Yes    Intervention Provide education, individualized exercise plan and daily activity instruction to help decrease symptoms of SOB with activities of daily living.    Expected Outcomes Short Term: Improve cardiorespiratory fitness to achieve a reduction of symptoms when performing ADLs;Long Term: Be able to perform more ADLs without symptoms or delay the onset of symptoms    Personal Goal Other Yes    Personal Goal My granddaughter who is 3 graduating from high school this spring.  Grandchild due in October and February.  I would like to be able to see these events with less shortness of breath, increase energy and stamina.  Pt would like to be independent and not have to live in a nursing home.     Intervention continue with consistent attendance  to pulmonary rehab as well as home exercise dilgent with keeping activie on off days from rehab           Core Components/Risk Factors/Patient Goals Review:   Goals and Risk Factor Review    Row Name 01/14/20 1517 02/12/20 2206 02/12/20 2211         Core Components/Risk Factors/Patient Goals Review   Personal Goals Review -- Improve shortness of breath with ADL's;Develop more efficient breathing techniques such as purse lipped breathing and diaphragmatic breathing and practicing self-pacing with activity. --     Review -- Oluwakemi has completed  6 exercise sessions. she is making good progress despite her orthopedic challenges.  Pt need verbal cues to perform PLB technique. Will continue to move pt toward independence in demonstrating breathing techniques [name] has completed  6 exercise sessions. she is making good progress despite her orthopedic challenges.  Pt need verbal cues to perform PLB technique. Will continue to move pt toward independence in demonstrating breathing techniques. Ptis looking foward to new grandchild next month.     Expected Outcomes -- See Admission Goals --            Core Components/Risk Factors/Patient Goals at Discharge (Final Review):   Goals and Risk Factor Review - 02/12/20 2211      Core Components/Risk Factors/Patient Goals Review   Review [name] has completed  6 exercise sessions. she is making good progress despite her orthopedic challenges.  Pt need verbal cues to perform PLB technique. Will continue to move pt toward independence in demonstrating breathing techniques. Ptis looking foward to new grandchild next month.           ITP Comments:  ITP Comments    Row Name 01/14/20 1439           ITP Comments Dr. Loanne Drilling, Medical Director for [facility]              Comments:  [name] has completed 6 exercise session in Pulmonary rehab. Pt maintains good  attendance. [name] will be instructed on home exercise by the Exercise Physiologist. Pulmonary rehab staff will  continue to monitor and reassess progress toward goals during her participation in Pulmonary Rehab. Cherre Huger, BSN Cardiac and Training and development officer

## 2020-02-13 NOTE — Telephone Encounter (Signed)
Okay to take steroid or tramadol in the presence of nintedanib [67fv) .  However, I do not prescribe steroid for arthritis or tramadol.  That will have to go through the arthritis doctor

## 2020-02-13 NOTE — Progress Notes (Signed)
Daily Session Note  Patient Details  Name: [name] MRN: [id number] Date of Birth: [birth year] Referring Provider:     Pulmonary Rehab Walk Test from 01/14/2020 in [place]  Referring Provider Dr. Chase Caller      Encounter Date: 02/13/2020  Check In:  Session Check In - 02/13/20 1347      Check-In   Supervising physician immediately available to respond to emergencies [facility] immediately available    Physician(s) Dr. Cathlean Sauer    Location [facility]    Staff Present Rodney Langton, RN;Jessica Hassell Done, MS, ACSM-CEP, Exercise Physiologist;Amarachukwu Lakatos Rollene Rotunda, RN, BSN    Virtual Visit No    Medication changes reported     No    Fall or balance concerns reported    No    Tobacco Cessation No Change    Warm-up and Cool-down Performed on first and last piece of equipment    Resistance Training Performed Yes    VAD Patient? No    PAD/SET Patient? No      Pain Assessment   Currently in Pain? No/denies    Pain Score 0-No pain    Multiple Pain Sites No           Capillary Blood Glucose: No results found for this or any previous visit (from the past 24 hour(s)).    Social History   Tobacco Use  Smoking Status Never Smoker  Smokeless Tobacco Never Used    Goals Met:  Proper associated with RPD/PD & O2 Sat Independence with exercise equipment Using PLB without cueing & demonstrates good technique Exercise tolerated well Strength training completed today  Goals Unmet:  Not Applicable  Comments: Service time is from 1300 to 1405   Dr. Fransico Him is Medical Director for Cardiac Rehab at [facility].

## 2020-02-14 ENCOUNTER — Telehealth: Payer: Self-pay | Admitting: Internal Medicine

## 2020-02-14 NOTE — Telephone Encounter (Signed)
Pt returning missed call. Can be reached at [phone]

## 2020-02-14 NOTE — Telephone Encounter (Signed)
Tried calling the pt and there was no answer- LMTCB.  

## 2020-02-14 NOTE — Telephone Encounter (Signed)
Spoke with patient regarding prior message.Advised patient per [facility] Okay to take steroid or tramadol in the presence of nintedanib [26fv) .  However, I do not prescribe steroid for arthritis or tramadol.  That will have to go through the arthritis doctor. Patient voice was understanding.Nothing else further needed.

## 2020-02-14 NOTE — Telephone Encounter (Signed)
  Dear [name]   . Please find out why she is asking me (pulm/ILD) to prescribe something for joint pain ? Her  PCP is Lawerance Cruel, MD  - and cannot he prescribe? And who is the arthritis doc she is seeing in Nov 2021? First visit?  If pain bad to tide over - she can take tramadol 94m tid prn x 4 weeks ..Marland KitchenNo refill   Thanks MR

## 2020-02-14 NOTE — Telephone Encounter (Signed)
I spoke with the pt  She states that the had not thought about calling her PCP b/c she had not seen him in a while  I advise that she start with this and then f/u with arthritis doc for eval of this pain  This seems more appropriate  She agreed to do so and will call Dr Harrington Challenger today  Will keep arthritis doc appt in Nov- she could not recall the providers full name but her first name is [name]  Pt feels comfortable with this plan  I will close and forward to MR to make him aware of this

## 2020-02-14 NOTE — Telephone Encounter (Signed)
Spoke with patient regarding prior message patient was advised to contact her arthritis see note from 9/9. Patient called back  to see what can she take for her arthritis pain because she can not see her arthritis doctor until nov.she stated that she take tylenol but it does not help the pain    Dr.Ramaswamy can you please advise.  Thank you

## 2020-02-18 ENCOUNTER — Encounter (HOSPITAL_COMMUNITY)
Admission: RE | Admit: 2020-02-18 | Discharge: 2020-02-18 | Disposition: A | Discharge status: Home / Self Care | Payer: Medicare Other | Source: Ambulatory Visit | Attending: Internal Medicine | Admitting: Internal Medicine

## 2020-02-18 ENCOUNTER — Other Ambulatory Visit: Payer: Self-pay

## 2020-02-18 DIAGNOSIS — J849 Interstitial pulmonary disease, unspecified: Secondary | ICD-10-CM | POA: Diagnosis not present

## 2020-02-18 NOTE — Progress Notes (Signed)
On arrival today at Pulmonary Rehab noted [name]'s BP was 164/80 on arrival in her left arm. During exercise checked her BP left arm 160/90 left arm, right arm 144/70. At discharge BP in left arm 162/80, right arm 140/60. I discussed with her that she needs to discuss this with her PCP. She is going to call to get her an appointment with Dr. Harrington Challenger her PCP. She has a BP cuff at home, I have encouraged her to check her BP write it down to take with her to her appointment.

## 2020-02-18 NOTE — Progress Notes (Signed)
I have reviewed a Home Exercise Prescription with [name] . [name] is not currently exercising at home. She stated that the only activity she does at home is house work.  The patient was advised to walk and perform band exercises 2-3 days a week for 30-45 minutes.  Jhada and I discussed how to progress their exercise prescription.  The patient stated that their goals were to be healthy and live longer.  The patient stated that they understand the exercise prescription.  We reviewed exercise guidelines, target heart rate during exercise, RPE Scale, weather conditions, NTG use, endpoints for exercise, warmup and cool down.  Patient is encouraged to come to me with any questions. I will continue to follow up with the patient to assist them with progression and safety.

## 2020-02-18 NOTE — Progress Notes (Signed)
Daily Session Note  Patient Details  Name: [name] MRN: [id number] Date of Birth: 10/12/1940 Referring Provider:     Pulmonary Rehab Walk Test from 01/14/2020 in [place]  Referring Provider Dr. Chase Caller      Encounter Date: 02/18/2020  Check In:  Session Check In - 02/18/20 1432      Check-In   Supervising physician immediately available to respond to emergencies [facility] immediately available    Physician(s) Dr. Cathlean Sauer    [facility]    Staff Present Maurice Small, RN, Bjorn Loser, MS, CEP, Exercise Physiologist;Nyelle Wolfson Jani Gravel, MS, ACSM-CEP, Exercise Physiologist    Virtual Visit No    Medication changes reported     No    Fall or balance concerns reported    No    Tobacco Cessation No Change    Warm-up and Cool-down Performed on first and last piece of equipment    Resistance Training Performed Yes    VAD Patient? No    PAD/SET Patient? No      Pain Assessment   Currently in Pain? No/denies    Pain Score 0-No pain    Multiple Pain Sites No           Capillary Blood Glucose: No results found for this or any previous visit (from the past 24 hour(s)).    Social History   Tobacco Use  Smoking Status Never Smoker  Smokeless Tobacco Never Used    Goals Met:  Exercise tolerated well No report of cardiac concerns or symptoms Strength training completed today  Goals Unmet:  Not Applicable  Comments: Service time is from 1318 to 9 See note  Dr. Fransico Him is Medical Director for Cardiac Rehab at [facility].

## 2020-02-20 ENCOUNTER — Other Ambulatory Visit: Payer: Self-pay

## 2020-02-20 ENCOUNTER — Encounter (HOSPITAL_COMMUNITY)
Admission: RE | Admit: 2020-02-20 | Discharge: 2020-02-20 | Disposition: A | Discharge status: Home / Self Care | Payer: Medicare Other | Source: Ambulatory Visit | Attending: Internal Medicine | Admitting: Internal Medicine

## 2020-02-20 DIAGNOSIS — J849 Interstitial pulmonary disease, unspecified: Secondary | ICD-10-CM | POA: Diagnosis not present

## 2020-02-20 NOTE — Progress Notes (Signed)
Daily Session Note  Patient Details  Name: [name] MRN: [id number] Date of Birth: 1941/03/18 Referring Provider:     Pulmonary Rehab Walk Test from 01/14/2020 in [place]  Referring Provider Dr. Chase Caller      Encounter Date: 02/20/2020  Check In:  Session Check In - 02/20/20 1513      Check-In   Supervising physician immediately available to respond to emergencies [facility] immediately available    Physician(s) Dr. Teryl Lucy    [facility]    Staff Present Maurice Small, RN, Isaac Laud, MS, ACSM-CEP, Exercise Physiologist;Rinaldo Macqueen Ysidro Evert, RN    Virtual Visit No    Medication changes reported     No    Fall or balance concerns reported    No    Tobacco Cessation No Change    Warm-up and Cool-down Performed on first and last piece of equipment    Resistance Training Performed Yes    VAD Patient? No    PAD/SET Patient? No      Pain Assessment   Currently in Pain? No/denies    Pain Score 3     Pain Location Knee    Pain Orientation Right    Pain Descriptors / Indicators Aching    Pain Type Chronic pain    Multiple Pain Sites No           Capillary Blood Glucose: No results found for this or any previous visit (from the past 24 hour(s)).    Social History   Tobacco Use  Smoking Status Never Smoker  Smokeless Tobacco Never Used    Goals Met:  Exercise tolerated well No report of cardiac concerns or symptoms Strength training completed today  Goals Unmet:  Not Applicable  Comments: Service time is from 1316 to 1425    Dr. Fransico Him is Medical Director for Cardiac Rehab at [facility].

## 2020-02-25 ENCOUNTER — Other Ambulatory Visit: Payer: Self-pay

## 2020-02-25 ENCOUNTER — Encounter (HOSPITAL_COMMUNITY)
Admission: RE | Admit: 2020-02-25 | Discharge: 2020-02-25 | Disposition: A | Discharge status: Home / Self Care | Payer: Medicare Other | Source: Ambulatory Visit | Attending: Internal Medicine | Admitting: Internal Medicine

## 2020-02-25 VITALS — Wt 215.8 lb

## 2020-02-25 DIAGNOSIS — J849 Interstitial pulmonary disease, unspecified: Secondary | ICD-10-CM

## 2020-02-25 NOTE — Progress Notes (Signed)
Daily Session Note  Patient Details  Name: [name] MRN: [id number] Date of Birth: 19-Jun-1940 Referring Provider:     Pulmonary Rehab Walk Test from 01/14/2020 in [place]  Referring Provider Dr. Chase Caller      Encounter Date: 02/25/2020  Check In:  Session Check In - 02/25/20 1411      Check-In   Supervising physician immediately available to respond to emergencies [facility] immediately available    Physician(s) Dr. Lonny Prude    [facility]    Staff Present Maurice Small, RN, Bjorn Loser, MS, CEP, Exercise Physiologist;Lisa Jani Gravel, MS, ACSM-CEP, Exercise Physiologist    Virtual Visit No    Medication changes reported     No    Fall or balance concerns reported    No    Tobacco Cessation No Change    Warm-up and Cool-down Performed on first and last piece of equipment    Resistance Training Performed Yes    VAD Patient? No    PAD/SET Patient? No      Pain Assessment   Currently in Pain? No/denies    Pain Score 0-No pain    Multiple Pain Sites No           Capillary Blood Glucose: No results found for this or any previous visit (from the past 24 hour(s)).   Exercise Prescription Changes - 02/25/20 1400      Response to Exercise   Blood Pressure (Admit) 126/66    Blood Pressure (Exercise) 146/76    Blood Pressure (Exit) 124/66    Heart Rate (Admit) 92 bpm    Heart Rate (Exercise) 113 bpm    Heart Rate (Exit) 96 bpm    Oxygen Saturation (Admit) 100 %    Oxygen Saturation (Exercise) 94 %    Oxygen Saturation (Exit) 96 %    Rating of Perceived Exertion (Exercise) 12    Perceived Dyspnea (Exercise) 2    Duration Continue with 30 min of aerobic exercise without signs/symptoms of physical distress.    Intensity THRR unchanged      Progression   Progression Continue to progress workloads to maintain intensity without signs/symptoms of physical distress.       Resistance Training   Training Prescription Yes    Weight orange bands    Reps 10-15    Time 10 Minutes      Oxygen   Oxygen Continuous    Liters 4      NuStep   Level 2    SPM 80    Minutes 15    METs 1.5      Track   Laps 10    Minutes 15           Social History   Tobacco Use  Smoking Status Never Smoker  Smokeless Tobacco Never Used    Goals Met:  Independence with exercise equipment Exercise tolerated well No report of cardiac concerns or symptoms Strength training completed today  Goals Unmet:  Not Applicable  Comments: Service time is from 1305 to 1410    Dr. Fransico Him is Medical Director for Cardiac Rehab at [facility].

## 2020-02-27 ENCOUNTER — Other Ambulatory Visit: Payer: Self-pay

## 2020-02-27 ENCOUNTER — Encounter (HOSPITAL_COMMUNITY)
Admission: RE | Admit: 2020-02-27 | Discharge: 2020-02-27 | Disposition: A | Discharge status: Home / Self Care | Payer: Medicare Other | Source: Ambulatory Visit | Attending: Internal Medicine | Admitting: Internal Medicine

## 2020-02-27 DIAGNOSIS — J849 Interstitial pulmonary disease, unspecified: Secondary | ICD-10-CM

## 2020-02-27 NOTE — Progress Notes (Signed)
Daily Session Note  Patient Details  Name: ADARIA HOLE MRN: [id number] Date of Birth: 01-10-1941 Referring Provider:     Pulmonary Rehab Walk Test from 01/14/2020 in [place]  Referring Provider Dr. Chase Caller      Encounter Date: 02/27/2020  Check In:  Session Check In - 02/27/20 1446      Check-In   Supervising physician immediately available to respond to emergencies [facility] immediately available    Physician(s) Dr. Tyler Pita    [facility]    Staff Present Maurice Small, RN, BSN;Lisa Ysidro Evert, RN;Calah Gershman Hassell Done, MS, ACSM-CEP, Exercise Physiologist    Virtual Visit No    Medication changes reported     No    Fall or balance concerns reported    No    Tobacco Cessation No Change    Warm-up and Cool-down Performed on first and last piece of equipment    Resistance Training Performed Yes    VAD Patient? No    PAD/SET Patient? No      Pain Assessment   Currently in Pain? No/denies    Multiple Pain Sites No           Capillary Blood Glucose: No results found for this or any previous visit (from the past 24 hour(s)).    Social History   Tobacco Use  Smoking Status Never Smoker  Smokeless Tobacco Never Used    Goals Met:  Proper associated with RPD/PD & O2 Sat Exercise tolerated well No report of cardiac concerns or symptoms Strength training completed today  Goals Unmet:  Not Applicable  Comments: Service time is from 1305 to 1417    Dr. Fransico Him is Medical Director for Cardiac Rehab at [facility].

## 2020-02-28 DIAGNOSIS — E7800 Pure hypercholesterolemia, unspecified: Secondary | ICD-10-CM | POA: Diagnosis not present

## 2020-02-28 DIAGNOSIS — I700 Atherosclerosis of aorta: Secondary | ICD-10-CM | POA: Diagnosis not present

## 2020-02-28 DIAGNOSIS — M2550 Pain in unspecified joint: Secondary | ICD-10-CM | POA: Diagnosis not present

## 2020-02-28 DIAGNOSIS — E559 Vitamin D deficiency, unspecified: Secondary | ICD-10-CM | POA: Diagnosis not present

## 2020-02-28 DIAGNOSIS — J8410 Pulmonary fibrosis, unspecified: Secondary | ICD-10-CM | POA: Diagnosis not present

## 2020-02-28 DIAGNOSIS — Z79899 Other long term (current) drug therapy: Secondary | ICD-10-CM | POA: Diagnosis not present

## 2020-02-28 DIAGNOSIS — Z23 Encounter for immunization: Secondary | ICD-10-CM | POA: Diagnosis not present

## 2020-02-28 DIAGNOSIS — R030 Elevated blood-pressure reading, without diagnosis of hypertension: Secondary | ICD-10-CM | POA: Diagnosis not present

## 2020-03-03 ENCOUNTER — Other Ambulatory Visit: Payer: Self-pay

## 2020-03-03 ENCOUNTER — Encounter (HOSPITAL_COMMUNITY)
Admission: RE | Admit: 2020-03-03 | Discharge: 2020-03-03 | Disposition: A | Discharge status: Home / Self Care | Payer: Medicare Other | Source: Ambulatory Visit | Attending: Internal Medicine | Admitting: Internal Medicine

## 2020-03-03 DIAGNOSIS — J849 Interstitial pulmonary disease, unspecified: Secondary | ICD-10-CM | POA: Diagnosis not present

## 2020-03-03 NOTE — Progress Notes (Signed)
Daily Session Note  Patient Details  Name: [name] MRN: [id number] Date of Birth: 10/18/1940 Referring Provider:     Pulmonary Rehab Walk Test from 01/14/2020 in [facility]  Referring Provider Dr. Ramaswamy      Encounter Date: 03/03/2020  Check In:  Session Check In - 03/03/20 1415      Check-In   Supervising physician immediately available to respond to emergencies [facility] immediately available    Physician(s) Dr. Pahwani    [facility]    Staff Present Dalton Fletcher, MS, CEP, Exercise Physiologist;David Makemson, MS, EP-C, CCRP;Jessica Martin, MS, ACSM-CEP, Exercise Physiologist;Portia Payne, RN, BSN    Virtual Visit No    Medication changes reported     No    Fall or balance concerns reported    Yes    Comments Pt fell in a parking lot over the weekend. Has some bruises and scrapes but nothing major. Pt states she feels fine. Will monitor her during exercise.    Tobacco Cessation No Change    Warm-up and Cool-down Performed on first and last piece of equipment    Resistance Training Performed Yes    VAD Patient? No    PAD/SET Patient? No      Pain Assessment   Currently in Pain? No/denies    Pain Score 0-No pain    Multiple Pain Sites No           Capillary Blood Glucose: No results found for this or any previous visit (from the past 24 hour(s)).    Social History   Tobacco Use  Smoking Status Never Smoker  Smokeless Tobacco Never Used    Goals Met:  Proper associated with RPD/PD & O2 Sat Exercise tolerated well No report of cardiac concerns or symptoms Strength training completed today  Goals Unmet:  Not Applicable  Comments: Service time is from 1305 to 1410    Dr. Traci Turner is Medical Director for Cardiac Rehab at [facility]. 

## 2020-03-05 ENCOUNTER — Telehealth (HOSPITAL_COMMUNITY): Payer: Self-pay | Admitting: Family Medicine

## 2020-03-05 ENCOUNTER — Ambulatory Visit (INDEPENDENT_AMBULATORY_CARE_PROVIDER_SITE_OTHER): Payer: Medicare Other | Admitting: Internal Medicine

## 2020-03-05 ENCOUNTER — Encounter (HOSPITAL_COMMUNITY): Payer: Medicare Other

## 2020-03-05 ENCOUNTER — Other Ambulatory Visit: Payer: Self-pay

## 2020-03-05 DIAGNOSIS — J849 Interstitial pulmonary disease, unspecified: Secondary | ICD-10-CM | POA: Diagnosis not present

## 2020-03-05 LAB — PULMONARY FUNCTION TEST
DL/VA % pred: 114 %
DL/VA: 4.54 ml/min/mmHg/L
DLCO cor % pred: 73 %
DLCO cor: 15.25 ml/min/mmHg
DLCO unc % pred: 73 %
DLCO unc: 15.25 ml/min/mmHg
FEF 25-75 Pre: 1.44 L/sec
FEF2575-%Pred-Pre: 91 %
FEV1-%Pred-Pre: 62 %
FEV1-Pre: 1.39 L
FEV1FVC-%Pred-Pre: 110 %
FEV6-%Pred-Pre: 60 %
FEV6-Pre: 1.7 L
FEV6FVC-%Pred-Pre: 105 %
FVC-%Pred-Pre: 57 %
FVC-Pre: 1.7 L
Pre FEV1/FVC ratio: 81 %
Pre FEV6/FVC Ratio: 100 %

## 2020-03-05 NOTE — Progress Notes (Signed)
Spirometry/DLCO performed today.

## 2020-03-05 NOTE — Progress Notes (Signed)
Pulmonary Individual Treatment Plan  Patient Details  Name: EULALA NEWCOMBE MRN: [id number] Date of Birth: 01-Apr-1941 Referring Provider:     Pulmonary Rehab Walk Test from 01/14/2020 in [address]  Referring Provider Dr. Chase Caller      Initial Encounter Date:    Pulmonary Rehab Walk Test from 01/14/2020 in [facility]  Date 01/14/20      Visit Diagnosis: ILD (interstitial lung disease) ([facility])  Patient's Home Medications on Admission:   Current Outpatient Medications:  .  albuterol (VENTOLIN HFA) 108 (90 Base) MCG/ACT inhaler, Inhale 2 puffs into the lungs every 6 (six) hours as needed for wheezing or shortness of breath., Disp: 1 Inhaler, Rfl: 2 .  aspirin 325 MG tablet, Take 650 mg by mouth daily as needed for moderate pain or headache., Disp: , Rfl:  .  Cholecalciferol (DIALYVITE VITAMIN D 5000) 125 MCG (5000 UT) capsule, Take 5,000 Units by mouth daily., Disp: , Rfl:  .  fluticasone (FLONASE) 50 MCG/ACT nasal spray, Place 1 spray into both nostrils daily as needed for allergies. , Disp: , Rfl:  .  ibuprofen (ADVIL) 200 MG tablet, Take 200-400 mg by mouth every 6 (six) hours as needed for headache or moderate pain., Disp: , Rfl:  .  loratadine (CLARITIN) 5 MG chewable tablet, Chew 5-10 mg by mouth daily as needed for allergies. , Disp: , Rfl:  .  Multiple Vitamin (MULTIVITAMIN WITH MINERALS) TABS tablet, Take 1 tablet by mouth daily., Disp: , Rfl:  .  OFEV 150 MG CAPS, TAKE 1 CAPSULE (150 MG TOTAL) BY MOUTH 2 (TWO) TIMES DAILY., Disp: 60 capsule, Rfl: 2 .  Respiratory Therapy Supplies (FLUTTER) DEVI, Use as directed., Disp: 1 each, Rfl: 0 .  sodium chloride HYPERTONIC 3 % nebulizer solution, USE CONTENTS OF 1 VIAL IN NEBULIZER 2 TIMES A DAY. (Patient taking differently: Take 4 mLs by nebulization daily. ), Disp: 480 mL, Rfl: 0 .  vitamin B-12 (CYANOCOBALAMIN) 1000 MCG tablet, Take 1,000 mcg by mouth every other day. ,  Disp: , Rfl:   Past Medical History: Past Medical History:  Diagnosis Date  . Allergic rhinitis    takes Claritin nightly  . Anxiety    but not on any meds  . Arthritis   . Asthma   . Carpal tunnel syndrome   . CHF (congestive heart failure) ([place])    patient denies-in office note 05/2015 by Aleene Davidson  . GERD (gastroesophageal reflux disease)   . History of bronchitis 3-52yr ago  . History of colon polyps    benign  . Joint pain   . Peripheral edema    takes Lasix daily  . Pneumonitis, hypersensitivity ([place] 05/2016   chronic hypersensitivity pneumonitis- Dr. MSpero Curb . Pulmonary hypertension ([place] 06/30/2017   patient denies-in office note from Dr. MSpero Curb  . Shortness of breath dyspnea    with exertion;Albuterol inhaler as needed  . Thyroid nodule     Tobacco Use: Social History   Tobacco Use  Smoking Status Never Smoker  Smokeless Tobacco Never Used    Labs: Recent Review Flowsheet Data    Labs for ITP Cardiac and Pulmonary Rehab Latest Ref Rng & Units 11/18/2014 11/21/2014 06/07/2018   PHART 7.35 - 7.45 7.426 7.356 7.412   PCO2ART 32 - 48 mmHg 40.7 49.1(H) 43.1   HCO3 20.0 - 28.0 mmol/L 26.3(H) 26.8(H) 26.9   TCO2 0 - 100 mmol/L 27.5 28.4 -   O2SAT % 96.8 96.2  99.8      Capillary Blood Glucose: Lab Results  Component Value Date   GLUCAP 135 (H) 06/08/2018   GLUCAP 142 (H) 06/08/2018   GLUCAP 131 (H) 06/08/2018     Pulmonary Assessment Scores:  Pulmonary Assessment Scores    Row Name 01/14/20 1634         ADL UCSD   ADL Phase Entry       mMRC Score   mMRC Score 3           UCSD: Self-administered rating of dyspnea associated with activities of daily living (ADLs) 6-point scale (0 = "not at all" to 5 = "maximal or unable to do because of breathlessness")  Scoring Scores range from 0 to 120.  Minimally important difference is 5 units  CAT: CAT can identify the health impairment of COPD patients and is better correlated with disease  progression.  CAT has a scoring range of zero to 40. The CAT score is classified into four groups of low (less than 10), medium (10 - 20), high (21-30) and very high (31-40) based on the impact level of disease on health status. A CAT score over 10 suggests significant symptoms.  A worsening CAT score could be explained by an exacerbation, poor medication adherence, poor inhaler technique, or progression of COPD or comorbid conditions.  CAT MCID is 2 points  mMRC: mMRC (Modified Medical Research Council) Dyspnea Scale is used to assess the degree of baseline functional disability in patients of respiratory disease due to dyspnea. No minimal important difference is established. A decrease in score of 1 point or greater is considered a positive change.   Pulmonary Function Assessment:   Exercise Target Goals: Exercise Program Goal: Individual exercise prescription set using results from initial 6 min walk test and THRR while considering  patient's activity barriers and safety.   Exercise Prescription Goal: Initial exercise prescription builds to 30-45 minutes a day of aerobic activity, 2-3 days per week.  Home exercise guidelines will be given to patient during program as part of exercise prescription that the participant will acknowledge.  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 HR 130 bpm     Max Ex. BP 180/82     2 Minute Post BP 156/84       Interval  HR   1 Minute HR 110     2 Minute HR 123     3 Minute HR 125     4 Minute HR 128     5 Minute HR 128     6 Minute HR 130     2 Minute Post HR 92     Interval Heart Rate? Yes       Interval Oxygen   Interval Oxygen? Yes     Baseline Oxygen Saturation % 99 %     1 Minute Oxygen Saturation % 98 %     1 Minute Liters of Oxygen 4 L     2 Minute Oxygen Saturation % 93 %     2 Minute Liters of Oxygen 4 L     3 Minute Oxygen Saturation % 92 %     3 Minute Liters of Oxygen 4 L     4 Minute Oxygen Saturation % 92 %     4 Minute Liters of Oxygen 4 L     5 Minute Oxygen Saturation % 92 %     5 Minute Liters of Oxygen 4 L     6 Minute Oxygen Saturation % 92 %     6 Minute Liters of Oxygen 4 L     2 Minute Post Oxygen Saturation % 98 %     2 Minute Post Liters of Oxygen 4 L            Oxygen Initial Assessment:  Oxygen Initial Assessment - 01/14/20 1625      Initial 6 min Walk   Oxygen Used Continuous    Liters per minute 4      Program Oxygen Prescription   Program Oxygen Prescription Continuous    Liters per minute 4      Intervention   Short Term Goals To learn and exhibit compliance with exercise, home and travel O2 prescription;To learn and understand importance of monitoring SPO2 with pulse oximeter and demonstrate accurate use of the pulse oximeter.;To learn and demonstrate proper pursed lip breathing techniques or other breathing techniques.;To learn and demonstrate proper use of respiratory medications;To learn and understand importance of maintaining oxygen saturations>88%    Long  Term Goals Exhibits compliance with exercise, home and travel O2 prescription;Verbalizes importance of  monitoring SPO2 with pulse oximeter and return demonstration;Maintenance of O2 saturations>88%;Exhibits proper breathing techniques, such as pursed lip breathing or other method taught during program session;Compliance with respiratory medication;Demonstrates proper use of MDI's           Oxygen Re-Evaluation:  Oxygen Re-Evaluation    Row Name 02/13/20 1621 03/02/20 1632           Program Oxygen Prescription   Program Oxygen Prescription Continuous Continuous      Liters per minute 4 4        Home Oxygen   Home Oxygen Device Home Concentrator;E-Tanks Home Concentrator;E-Tanks      Sleep Oxygen Prescription Continuous Continuous      Liters per minute 3 3      Home Exercise Oxygen Prescription Continuous Continuous      Liters per minute 4 4      Home Resting Oxygen Prescription Continuous Continuous      Liters per minute 3 4      Compliance with Home Oxygen Use Yes Yes        Goals/Expected Outcomes   Short Term Goals To learn and exhibit compliance with exercise, home and travel O2 prescription;To learn  and understand importance of monitoring SPO2 with pulse oximeter and demonstrate accurate use of the pulse oximeter.;To learn and demonstrate proper pursed lip breathing techniques or other breathing techniques.;To learn and demonstrate proper use of respiratory medications;To learn and understand importance of maintaining oxygen saturations>88% To learn and exhibit compliance with exercise, home and travel O2 prescription;To learn and understand importance of monitoring SPO2 with pulse oximeter and demonstrate accurate use of the pulse oximeter.;To learn and demonstrate proper pursed lip breathing techniques or other breathing techniques.;To learn and demonstrate proper use of respiratory medications;To learn and understand importance of maintaining oxygen saturations>88%      Long  Term Goals Exhibits compliance with exercise, home and travel O2 prescription;Verbalizes importance  of monitoring SPO2 with pulse oximeter and return demonstration;Maintenance of O2 saturations>88%;Exhibits proper breathing techniques, such as pursed lip breathing or other method taught during program session;Compliance with respiratory medication;Demonstrates proper use of MDI's Exhibits compliance with exercise, home and travel O2 prescription;Verbalizes importance of monitoring SPO2 with pulse oximeter and return demonstration;Maintenance of O2 saturations>88%;Exhibits proper breathing techniques, such as pursed lip breathing or other method taught during program session;Compliance with respiratory medication;Demonstrates proper use of MDI's      Comments -- Pt's oxygen saturation has been at normal levels while exercising in rehab. She states she has a pulse oximeter at home and checks it while at home.      Goals/Expected Outcomes compliance and understanding Compliance and understanding of oxygen saturation and pursed lip breathing             Oxygen Discharge (Final Oxygen Re-Evaluation):  Oxygen Re-Evaluation - 03/02/20 1632      Program Oxygen Prescription   Program Oxygen Prescription Continuous    Liters per minute 4      Home Oxygen   Home Oxygen Device Home Concentrator;E-Tanks    Sleep Oxygen Prescription Continuous    Liters per minute 3    Home Exercise Oxygen Prescription Continuous    Liters per minute 4    Home Resting Oxygen Prescription Continuous    Liters per minute 4    Compliance with Home Oxygen Use Yes      Goals/Expected Outcomes   Short Term Goals To learn and exhibit compliance with exercise, home and travel O2 prescription;To learn and understand importance of monitoring SPO2 with pulse oximeter and demonstrate accurate use of the pulse oximeter.;To learn and demonstrate proper pursed lip breathing techniques or other breathing techniques.;To learn and demonstrate proper use of respiratory medications;To learn and understand importance of maintaining  oxygen saturations>88%    Long  Term Goals Exhibits compliance with exercise, home and travel O2 prescription;Verbalizes importance of monitoring SPO2 with pulse oximeter and return demonstration;Maintenance of O2 saturations>88%;Exhibits proper breathing techniques, such as pursed lip breathing or other method taught during program session;Compliance with respiratory medication;Demonstrates proper use of MDI's    Comments Pt's oxygen saturation has been at normal levels while exercising in rehab. She states she has a pulse oximeter at home and checks it while at home.    Goals/Expected Outcomes Compliance and understanding of oxygen saturation and pursed lip breathing           Initial Exercise Prescription:  Initial Exercise Prescription - 01/14/20 1600      Date of Initial Exercise RX and Referring Provider   Date 01/14/20    Referring Provider Dr. Chase Caller      Oxygen   Oxygen Continuous    Liters 4      NuStep  Level 2    SPM 80    Minutes 15      Track   Laps 10    Minutes 15      Prescription Details   Frequency (times per week) 2    Duration Progress to 30 minutes of continuous aerobic without signs/symptoms of physical distress      Intensity   THRR 40-80% of Max Heartrate 56-112    Ratings of Perceived Exertion 11-13    Perceived Dyspnea 0-4      Resistance Training   Training Prescription Yes    Weight orange bands    Reps 10-15           Perform Capillary Blood Glucose checks as needed.  Exercise Prescription Changes:  Exercise Prescription Changes    Row Name 01/28/20 1500 02/11/20 1500 02/18/20 1500 02/25/20 1400 03/03/20 1600     Response to Exercise   Blood Pressure (Admit) 160/70 142/70 -- 126/66 128/64   Blood Pressure (Exercise) 150/78 150/80 -- 146/76 --   Blood Pressure (Exit) 132/62 166/80  rechecked 134/70 -- 124/66 120/80   Heart Rate (Admit) 92 bpm 96 bpm -- 92 bpm 79 bpm   Heart Rate (Exercise) 98 bpm 122 bpm -- 113 bpm 111 bpm    Heart Rate (Exit) 97 bpm 97 bpm -- 96 bpm 85 bpm   Oxygen Saturation (Admit) 98 % 97 % -- 100 % 98 %   Oxygen Saturation (Exercise) 97 % 88 % -- 94 % 94 %   Oxygen Saturation (Exit) 97 % 97 % -- 96 % 99 %   Rating of Perceived Exertion (Exercise) 12 13 -- 12 11   Perceived Dyspnea (Exercise) 1 2 -- 2 1   Duration Continue with 30 min of aerobic exercise without signs/symptoms of physical distress. Continue with 30 min of aerobic exercise without signs/symptoms of physical distress. -- Continue with 30 min of aerobic exercise without signs/symptoms of physical distress. Continue with 30 min of aerobic exercise without signs/symptoms of physical distress.   Intensity THRR unchanged THRR unchanged -- THRR unchanged THRR unchanged     Progression   Progression Continue to progress workloads to maintain intensity without signs/symptoms of physical distress. Continue to progress workloads to maintain intensity without signs/symptoms of physical distress. -- Continue to progress workloads to maintain intensity without signs/symptoms of physical distress. Continue to progress workloads to maintain intensity without signs/symptoms of physical distress.   Average METs 1.5 -- -- -- 1.6     Resistance Training   Training Prescription Yes Yes -- Yes Yes   Weight orange bands orange bands -- orange bands orange bands   Reps 10-15 10-15 -- 10-15 10-15   Time 10 Minutes 10 Minutes -- 10 Minutes 10 Minutes     Oxygen   Oxygen Continuous Continuous -- Continuous Continuous   Liters 4 4 -- 4 4     NuStep   Level 2 2 -- 2 2   SPM 80 80 -- 80 80   Minutes 30 30 -- 15 15   METs -- 1.6 -- 1.5 1.6     Track   Laps -- -- -- 10 8   Minutes -- -- -- 15 15     Home Exercise Plan   Plans to continue exercise at -- -- Home (comment) -- --   Frequency -- -- Add 2 additional days to program exercise sessions. -- --   Initial Home Exercises Provided -- -- 02/18/20 -- --  Exercise Comments:   Exercise Comments    Row Name 01/21/20 1315 02/18/20 1515 03/02/20 1635       Exercise Comments Pt completed her first day of exercise in pulmonary rehab today. She was able to tolerate exercise well with no complaints. Completed home exercise with pt today. Pt stated that the only exercise she does at home is house work. We discussed a home exercise prescription and we will continue to follow up and prgress as she is able. pt has been making improvements while in pulmonary rehab but still complains of hip and knee pain while exercising. She continues to push through it because it is chronic arthritis and she knows it will get worse if she does not do anything. will continue to monitor.            Exercise Goals and Review:  Exercise Goals    Row Name 01/14/20 1457 01/14/20 1637           Exercise Goals   Increase Physical Activity -- Yes      Intervention -- Provide advice, education, support and counseling about physical activity/exercise needs.;Develop an individualized exercise prescription for aerobic and resistive training based on initial evaluation findings, risk stratification, comorbidities and participant's personal goals.      Expected Outcomes -- Short Term: Attend rehab on a regular basis to increase amount of physical activity.;Long Term: Add in home exercise to make exercise part of routine and to increase amount of physical activity.;Long Term: Exercising regularly at least 3-5 days a week.      Increase Strength and Stamina Yes Yes      Intervention Provide advice, education, support and counseling about physical activity/exercise needs.;Develop an individualized exercise prescription for aerobic and resistive training based on initial evaluation findings, risk stratification, comorbidities and participant's personal goals. Provide advice, education, support and counseling about physical activity/exercise needs.;Develop an individualized exercise prescription for aerobic and  resistive training based on initial evaluation findings, risk stratification, comorbidities and participant's personal goals.      Expected Outcomes Short Term: Increase workloads from initial exercise prescription for resistance, speed, and METs.;Short Term: Perform resistance training exercises routinely during rehab and add in resistance training at home;Long Term: Improve cardiorespiratory fitness, muscular endurance and strength as measured by increased METs and functional capacity (6MWT) Short Term: Increase workloads from initial exercise prescription for resistance, speed, and METs.;Short Term: Perform resistance training exercises routinely during rehab and add in resistance training at home;Long Term: Improve cardiorespiratory fitness, muscular endurance and strength as measured by increased METs and functional capacity (6MWT)      Able to understand and use rate of perceived exertion (RPE) scale Yes Yes      Intervention Provide education and explanation on how to use RPE scale Provide education and explanation on how to use RPE scale      Expected Outcomes Long Term:  Able to use RPE to guide intensity level when exercising independently;Short Term: Able to use RPE daily in rehab to express subjective intensity level Long Term:  Able to use RPE to guide intensity level when exercising independently;Short Term: Able to use RPE daily in rehab to express subjective intensity level      Able to understand and use Dyspnea scale Yes Yes      Intervention Provide education and explanation on how to use Dyspnea scale Provide education and explanation on how to use Dyspnea scale      Expected Outcomes Short Term: Able to use Dyspnea scale  daily in rehab to express subjective sense of shortness of breath during exertion;Long Term: Able to use Dyspnea scale to guide intensity level when exercising independently Short Term: Able to use Dyspnea scale daily in rehab to express subjective sense of shortness of  breath during exertion;Long Term: Able to use Dyspnea scale to guide intensity level when exercising independently      Knowledge and understanding of Target Heart Rate Range (THRR) -- Yes      Intervention -- Provide education and explanation of THRR including how the numbers were predicted and where they are located for reference      Expected Outcomes -- Short Term: Able to state/look up THRR;Short Term: Able to use daily as guideline for intensity in rehab;Long Term: Able to use THRR to govern intensity when exercising independently      Understanding of Exercise Prescription Yes Yes      Intervention Provide education, explanation, and written materials on patient's individual exercise prescription Provide education, explanation, and written materials on patient's individual exercise prescription      Expected Outcomes Short Term: Able to explain program exercise prescription;Long Term: Able to explain home exercise prescription to exercise independently Short Term: Able to explain program exercise prescription;Long Term: Able to explain home exercise prescription to exercise independently             Exercise Goals Re-Evaluation :  Exercise Goals Re-Evaluation    Row Name 02/13/20 1622 03/02/20 1629           Exercise Goal Re-Evaluation   Exercise Goals Review Increase Physical Activity;Increase Strength and Stamina;Able to understand and use rate of perceived exertion (RPE) scale;Able to understand and use Dyspnea scale;Knowledge and understanding of Target Heart Rate Range (THRR);Able to check pulse independently;Understanding of Exercise Prescription;Improve claudication pain tolerance and improve walking ability Increase Physical Activity;Increase Strength and Stamina;Able to understand and use rate of perceived exertion (RPE) scale;Able to understand and use Dyspnea scale;Knowledge and understanding of Target Heart Rate Range (THRR);Understanding of Exercise Prescription      Comments  Pt has completed 7 exercise sessions. Each session she has completed she has complained of Knee and Hip pain due to arthritis. She is slowly making progress on the Nustep and walking on the track. She is exercising at 1.6 METs on the Nustep. We will continue to progress and monitor as able. pt has completed 11 exercise sessions. Pt has complaints of knee and hip pain with exercise due to chronic arthritis. She has made progressions with the Nustep and walking around the track. She is exercising at 1.6 METS on the Nustep and 2.39 METS on the track      Expected Outcomes Through exercise at rehab and home the patient will decrease shortness of breath with daily activities and feel confident in carrying out an exercise regimn at home Through exercise at rehab and home the patient will decrease shortness of breath with daily activities and feel confident in carrying out an exercise regimn at home             Discharge Exercise Prescription (Final Exercise Prescription Changes):  Exercise Prescription Changes - 03/03/20 1600      Response to Exercise   Blood Pressure (Admit) 128/64    Blood Pressure (Exit) 120/80    Heart Rate (Admit) 79 bpm    Heart Rate (Exercise) 111 bpm    Heart Rate (Exit) 85 bpm    Oxygen Saturation (Admit) 98 %    Oxygen Saturation (Exercise) 94 %  Oxygen Saturation (Exit) 99 %    Rating of Perceived Exertion (Exercise) 11    Perceived Dyspnea (Exercise) 1    Duration Continue with 30 min of aerobic exercise without signs/symptoms of physical distress.    Intensity THRR unchanged      Progression   Progression Continue to progress workloads to maintain intensity without signs/symptoms of physical distress.    Average METs 1.6      Resistance Training   Training Prescription Yes    Weight orange bands    Reps 10-15    Time 10 Minutes      Oxygen   Oxygen Continuous    Liters 4      NuStep   Level 2    SPM 80    Minutes 15    METs 1.6      Track   Laps  8    Minutes 15           Nutrition:  Target Goals: Understanding of nutrition guidelines, daily intake of sodium <1563m, cholesterol <208m calories 30% from fat and 7% or less from saturated fats, daily to have 5 or more servings of fruits and vegetables.  Biometrics:  Pre Biometrics - 01/14/20 1638      Pre Biometrics   Grip Strength 36 kg            Nutrition Therapy Plan and Nutrition Goals:  Nutrition Therapy & Goals - 01/28/20 1427      Nutrition Therapy   Diet Therapeutic Lifestyle Changes      Personal Nutrition Goals   Nutrition Goal Pt to build a healthy plate including vegetables, fruits, whole grains, and low-fat dairy products in a healthy meal plan.    Personal Goal #2 Pt to continue to eat 5-6 small meals throughout day    Personal Goal #3 Pt to eat main meal later in the afternoon when she has the most energy    Personal Goal #4 Pt to continue to eat protein rich snack with OFKayloreducate and counsel regarding individualized specific dietary modifications aiming towards targeted core components such as weight, hypertension, lipid management, diabetes, heart failure and other comorbidities.    Expected Outcomes Short Term Goal: Understand basic principles of dietary content, such as calories, fat, sodium, cholesterol and nutrients.           Nutrition Assessments:   Nutrition Goals Re-Evaluation:  Nutrition Goals Re-Evaluation    [place] 01/28/20 1427 03/04/20 1143           Goals   Current Weight 217 lb (98.4 kg) 217 lb 13 oz (98.8 kg)      Nutrition Goal -- Pt to build a healthy plate including vegetables, fruits, whole grains, and low-fat dairy products in a healthy meal plan.        Personal Goal #2 Re-Evaluation   Personal Goal #2 -- Pt to continue to eat 5-6 small meals throughout day        Personal Goal #3 Re-Evaluation   Personal Goal #3 -- Pt to eat main meal later in the afternoon  when she has the most energy        Personal Goal #4 Re-Evaluation   Personal Goal #4 -- Pt to continue to eat protein rich snack with OFEV             Nutrition Goals Discharge (Final Nutrition Goals Re-Evaluation):  Nutrition Goals Re-Evaluation - 03/04/20 1143  Goals   Current Weight 217 lb 13 oz (98.8 kg)    Nutrition Goal Pt to build a healthy plate including vegetables, fruits, whole grains, and low-fat dairy products in a healthy meal plan.      Personal Goal #2 Re-Evaluation   Personal Goal #2 Pt to continue to eat 5-6 small meals throughout day      Personal Goal #3 Re-Evaluation   Personal Goal #3 Pt to eat main meal later in the afternoon when she has the most energy      Personal Goal #4 Re-Evaluation   Personal Goal #4 Pt to continue to eat protein rich snack with OFEV           Psychosocial: Target Goals: Acknowledge presence or absence of significant depression and/or stress, maximize coping skills, provide positive support system. Participant is able to verbalize types and ability to use techniques and skills needed for reducing stress and depression.  Initial Review & Psychosocial Screening:  Initial Psych Review & Screening - 01/14/20 1506      Initial Review   Current issues with None Identified      Family Dynamics   Comments son lives around the corner - supportive of Filippa.  Pt daughter lives about 10 minutes awake.      Barriers   Psychosocial barriers to participate in program There are no identifiable barriers or psychosocial needs.      Screening Interventions   Interventions Encouraged to exercise           Quality of Life Scores:  Scores of 19 and below usually indicate a poorer quality of life in these areas.  A difference of  2-3 points is a clinically meaningful difference.  A difference of 2-3 points in the total score of the Quality of Life Index has been associated with significant improvement in overall quality of life,  self-image, physical symptoms, and general health in studies assessing change in quality of life.  PHQ-9: Recent Review Flowsheet Data    Depression screen [facility] 2/9 01/14/2020 12/13/2016 07/18/2016 02/16/2015   Decreased Interest 0 0 0 0   Down, Depressed, Hopeless 0 0 0 0   PHQ - 2 Score 0 0 0 0   Altered sleeping 0 - - -   Tired, decreased energy 2 - - -   Change in appetite 1 - - -   Feeling bad or failure about yourself  1 - - -   Trouble concentrating 0 - - -   Moving slowly or fidgety/restless 0 - - -   Suicidal thoughts 0 - - -   PHQ-9 Score 4 - - -   Difficult doing work/chores Not difficult at all - - -     Interpretation of Total Score  Total Score Depression Severity:  1-4 = Minimal depression, 5-9 = Mild depression, 10-14 = Moderate depression, 15-19 = Moderately severe depression, 20-27 = Severe depression   Psychosocial Evaluation and Intervention:  Psychosocial Evaluation - 03/03/20 1602      Psychosocial Evaluation & Interventions   Interventions Encouraged to exercise with the program and follow exercise prescription;Stress management education;Relaxation education    Comments Modine denies any psychosocial barriers to participating in pulmoarny rehab           Psychosocial Re-Evaluation:  Psychosocial Re-Evaluation    Row Name 02/12/20 2202 03/03/20 1603           Psychosocial Re-Evaluation   Current issues with None Identified None Identified  Comments Leenah has completed 6 exercise sessions and although 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  pt had a fall at her primary MD office.  tripped over a parking cement bumper.  Bruise to her left leg and knot on forehead. Pt was evaluated  by  primary MD.      Expected Outcomes patient will remain free from psycosocial barriers  Torah will remain free from psycosocial barriers      Interventions Encouraged to attend Pulmonary Rehabilitation for the exercise;Relaxation education;Stress management education Encouraged to attend Pulmonary Rehabilitation for the exercise;Relaxation education;Stress management education      Continue Psychosocial Services  -- Follow up required by staff             Psychosocial Discharge (Final Psychosocial Re-Evaluation):  Psychosocial Re-Evaluation - 03/03/20 1603      Psychosocial Re-Evaluation   Current issues with None Identified    Comments Janneth has completed 12 exercise sessions and although 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  pt had a fall at her primary MD office.  tripped over a parking cement bumper.  Bruise to her left leg and knot on forehead. Pt was evaluated  by primary MD.    Expected Outcomes Ashaunte will remain free from psycosocial barriers    Interventions Encouraged to attend Pulmonary Rehabilitation for the exercise;Relaxation education;Stress management education    Continue Psychosocial Services  Follow up required by staff           Education: Education Goals: Education classes will be provided on a weekly basis, covering required topics. Participant will state understanding/return demonstration of topics presented.  Learning Barriers/Preferences:  Learning Barriers/Preferences - 01/14/20 1509      Learning Barriers/Preferences   Learning Barriers Sight;Hearing   reader glasses; age appropriate hearing loss   Learning Preferences Group Instruction;Individual Instruction;Skilled Demonstration;Verbal Instruction;Written Material;Computer/Internet           Education Topics: Risk Factor Reduction:  -Group instruction that is supported by a PowerPoint presentation.  Instructor discusses the definition of a risk factor, different risk factors for pulmonary disease, and how the heart and lungs work together.     PULMONARY REHAB OTHER RESPIRATORY from 12/08/2016 in [place]  Date 08/25/16  Educator EP  Instruction Review Code (Retired) 2- meets goals/outcomes      Nutrition for Pulmonary Patient:  -Group instruction provided by [employer], verbal discussion, and written materials to support subject matter. The instructor gives an explanation and review of healthy diet recommendations, which includes a discussion on weight management, recommendations for fruit and vegetable consumption, as well as protein, fluid, caffeine, fiber, sodium, sugar, and alcohol. Tips for eating when patients are short of breath are discussed.   PULMONARY REHAB OTHER RESPIRATORY from 02/20/2020 in [place]  Date 02/20/20  Educator Handout  [Nutrition by Gerrard  Instruction Review Code 2- Demonstrated Understanding      Pursed Lip Breathing:  -Group instruction that is supported by demonstration and informational handouts. Instructor discusses the benefits of pursed lip and diaphragmatic breathing and detailed demonstration on how to preform both.     Oxygen Safety:  -Group instruction provided  by PowerPoint, verbal discussion, and written material to support subject matter. There is an overview of "What is Oxygen" and "Why do we need it".  Instructor also reviews how to create a safe environment for oxygen use, the importance of using oxygen as prescribed, and the risks of noncompliance. There is a brief discussion on traveling with oxygen and resources the patient may utilize.   PULMONARY REHAB OTHER RESPIRATORY from 12/08/2016 in [place]  Date 09/08/16  Educator Truddie Crumble  Instruction Review Code (Retired) 2- meets goals/outcomes      Oxygen Equipment:  -Group  instruction provided by [employer], Network engineer, and Insurance underwriter.   PULMONARY REHAB OTHER RESPIRATORY from 12/08/2016 in [place]  Date 10/27/16  Educator George[employer]  Instruction Review Code (Retired) 2- meets goals/outcomes      Signs and Symptoms:  -Group instruction provided by written material and verbal discussion to support subject matter. Warning signs and symptoms of infection, stroke, and heart attack are reviewed and when to call the physician/911 reinforced. Tips for preventing the spread of infection discussed.   PULMONARY REHAB OTHER RESPIRATORY from 12/08/2016 in [place]  Date 10/06/16  Educator rn  Instruction Review Code (Retired) 2- meets goals/outcomes      Advanced Directives:  -Group instruction provided by verbal instruction and written material to support subject matter. Instructor reviews Advanced Directive laws and proper instruction for filling out document.   Pulmonary Video:  -Group video education that reviews the importance of medication and oxygen compliance, exercise, good nutrition, pulmonary hygiene, and pursed lip and diaphragmatic breathing for the pulmonary patient.   PULMONARY REHAB OTHER RESPIRATORY from 12/08/2016 in [place]  Date 09/15/16  Instruction Review Code (Retired) R- Review/reinforce      Exercise for the Pulmonary Patient:  -Group instruction that is supported by a PowerPoint presentation. Instructor discusses benefits of exercise, core components of exercise, frequency, duration, and intensity of an exercise routine, importance of utilizing pulse oximetry during exercise, safety while exercising, and options of places to exercise outside of rehab.     PULMONARY REHAB OTHER RESPIRATORY from 12/08/2016 in [place]  Date 09/22/16  Educator EP  Instruction  Review Code (Retired) 2- meets goals/outcomes      Pulmonary Medications:  -Verbally interactive group education provided by Art therapist with focus on inhaled medications and proper administration.   PULMONARY REHAB OTHER RESPIRATORY from 12/08/2016 in [place]  Date 09/01/16  Educator pharm  Instruction Review Code (Retired) 2- Lawyer and Physiology of the Respiratory System and Intimacy:  -Group instruction provided by PowerPoint, verbal discussion, and written material to support subject matter. Instructor reviews respiratory cycle and anatomical components of the respiratory system and their functions. Instructor also reviews differences in obstructive and restrictive respiratory diseases with examples of each. Intimacy, Sex, and Sexuality differences are reviewed with a discussion on how relationships can change when diagnosed with pulmonary disease. Common sexual concerns are reviewed.   PULMONARY REHAB OTHER RESPIRATORY from 12/08/2016 in [place]  Date 08/11/16  Educator RN  Instruction Review Code (Retired) 2- meets goals/outcomes      MD DAY -A group question and answer session with a medical doctor that allows participants to ask questions that relate to their pulmonary disease state.   PULMONARY REHAB OTHER  RESPIRATORY from 12/08/2016 in [place]  Date 12/08/16  Educator yacoub  Instruction Review Code (Retired) 2- meets goals/outcomes      OTHER EDUCATION -Group or individual verbal, written, or video instructions that support the educational goals of the pulmonary rehab program.   PULMONARY REHAB OTHER RESPIRATORY from 02/20/2020 in [place]  Date 02/11/20  Educator Handout  [METS]      Holiday Eating Survival Tips:  -Group instruction provided by PowerPoint slides, verbal discussion, and written materials to  support subject matter. The instructor gives patients tips, tricks, and techniques to help them not only survive but enjoy the holidays despite the onslaught of food that accompanies the holidays.   Knowledge Questionnaire Score:   Core Components/Risk Factors/Patient Goals at Admission:  Personal Goals and Risk Factors at Admission - 01/14/20 1512      Core Components/Risk Factors/Patient Goals on Admission    Weight Management Weight Loss   not a priority for this pt due to her age, but okay if lost weight   Improve shortness of breath with ADL's Yes    Intervention Provide education, individualized exercise plan and daily activity instruction to help decrease symptoms of SOB with activities of daily living.    Expected Outcomes Short Term: Improve cardiorespiratory fitness to achieve a reduction of symptoms when performing ADLs;Long Term: Be able to perform more ADLs without symptoms or delay the onset of symptoms    Personal Goal Other Yes    Personal Goal My granddaughter who is 39 graduating from high school this spring.  Grandchild due in October and February.  I would like to be able to see these events with less shortness of breath, increase energy and stamina.  Pt would like to be independent and not have to live in a nursing home.    Intervention continue with consistent attendance to pulmonary rehab as well as home exercise dilgent with keeping activie on off days from rehab           Core Components/Risk Factors/Patient Goals Review:   Goals and Risk Factor Review    Row Name 01/14/20 1517 02/12/20 2206 02/12/20 2211 03/03/20 1606       Core Components/Risk Factors/Patient Goals Review   Personal Goals Review -- Improve shortness of breath with ADL's;Develop more efficient breathing techniques such as purse lipped breathing and diaphragmatic breathing and practicing self-pacing with activity. -- Improve shortness of breath with ADL's;Develop more efficient breathing  techniques such as purse lipped breathing and diaphragmatic breathing and practicing self-pacing with activity.;Increase knowledge of respiratory medications and ability to use respiratory devices properly.    Review -- Murrel has completed  6 exercise sessions. she is making good progress despite her orthopedic challenges.  Pt need verbal cues to perform PLB technique. Will continue to move pt toward independence in demonstrating breathing techniques Sundai has completed  6 exercise sessions. she is making good progress despite her orthopedic challenges.  Pt need verbal cues to perform PLB technique. Will continue to move pt toward independence in demonstrating breathing techniques. Ptis looking foward to new grandchild next month. Novalynn has completed  12 exercise sessions. she is making good progress despite her orthopedic challenges. Pt with minor setback in her progress due to a fall at her primary MD office.  When able would like to see increase in the nustep to level 3 Yanil tripped over a cement parking bumper.  Ana is looking foward to new grandchild in  October.    Expected Outcomes -- See Admission Goals -- See Admission Goals           Core Components/Risk Factors/Patient Goals at Discharge (Final Review):   Goals and Risk Factor Review - 03/03/20 1606      Core Components/Risk Factors/Patient Goals Review   Personal Goals Review Improve shortness of breath with ADL's;Develop more efficient breathing techniques such as purse lipped breathing and diaphragmatic breathing and practicing self-pacing with activity.;Increase knowledge of respiratory medications and ability to use respiratory devices properly.    Review Rowen has completed  12 exercise sessions. she is making good progress despite her orthopedic challenges. Pt with minor setback in her progress due to a fall at her primary MD office.  When able would like to see increase in the nustep to level 3 Marabella tripped over a  cement parking bumper.  Kambry is looking foward to new grandchild in October.    Expected Outcomes See Admission Goals           ITP Comments:   Comments:  Skyra has completed 12 exercise session in Pulmonary rehab. Pt maintains good attendance and is hindered with orthopedic limitations for consistent home exercise. Pulmonary rehab staff will  continue to monitor and reassess progress toward goals during her participation in Pulmonary Rehab. Cherre Huger, BSN Cardiac and Training and development officer

## 2020-03-06 DIAGNOSIS — Z0000 Encounter for general adult medical examination without abnormal findings: Secondary | ICD-10-CM | POA: Diagnosis not present

## 2020-03-06 DIAGNOSIS — Z1389 Encounter for screening for other disorder: Secondary | ICD-10-CM | POA: Diagnosis not present

## 2020-03-09 ENCOUNTER — Other Ambulatory Visit: Payer: Self-pay

## 2020-03-09 ENCOUNTER — Ambulatory Visit (INDEPENDENT_AMBULATORY_CARE_PROVIDER_SITE_OTHER): Payer: Medicare Other | Admitting: Internal Medicine

## 2020-03-09 ENCOUNTER — Encounter: Payer: Self-pay | Admitting: Internal Medicine

## 2020-03-09 VITALS — BP 132/60 | HR 91 | Temp 97.7°F | Ht 66.5 in | Wt 215.4 lb

## 2020-03-09 DIAGNOSIS — Z7185 Encounter for immunization safety counseling: Secondary | ICD-10-CM

## 2020-03-09 DIAGNOSIS — J679 Hypersensitivity pneumonitis due to unspecified organic dust: Secondary | ICD-10-CM | POA: Diagnosis not present

## 2020-03-09 DIAGNOSIS — J849 Interstitial pulmonary disease, unspecified: Secondary | ICD-10-CM | POA: Diagnosis not present

## 2020-03-09 DIAGNOSIS — L03115 Cellulitis of right lower limb: Secondary | ICD-10-CM | POA: Diagnosis not present

## 2020-03-09 DIAGNOSIS — J9611 Chronic respiratory failure with hypoxia: Secondary | ICD-10-CM | POA: Diagnosis not present

## 2020-03-09 DIAGNOSIS — Z5181 Encounter for therapeutic drug level monitoring: Secondary | ICD-10-CM

## 2020-03-09 LAB — HEPATIC FUNCTION PANEL
ALT: 11 U/L (ref 0–35)
AST: 15 U/L (ref 0–37)
Albumin: 3.7 g/dL (ref 3.5–5.2)
Alkaline Phosphatase: 70 U/L (ref 39–117)
Bilirubin, Direct: 0.1 mg/dL (ref 0.0–0.3)
Total Bilirubin: 0.3 mg/dL (ref 0.2–1.2)
Total Protein: 7 g/dL (ref 6.0–8.3)

## 2020-03-09 MED ORDER — ALBUTEROL SULFATE HFA 108 (90 BASE) MCG/ACT IN AERS: 2.0000 | INHALATION_SPRAY | Freq: Four times a day (QID) | RESPIRATORY_TRACT | 6 refills | Status: DC | PRN

## 2020-03-09 MED ORDER — CEPHALEXIN 500 MG PO CAPS: 500.0000 mg | ORAL_CAPSULE | Freq: Three times a day (TID) | ORAL | 0 refills | Status: AC

## 2020-03-09 NOTE — Patient Instructions (Addendum)
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 Marlane Mingle of the pulmonary fibrosis support group   Plan   -Continue nintedanib as before but will hold off on any other medication  -Okay to do cephalexin for arthralgia - Check LFT 03/09/2020 -Hold off on cardiology referral for pulmonary hypertension evaluation -Continue pulmonary rehabilitation at [place] involvement with the support group through [place] interested in ILD-pro registry study through [facility].  Research staff will prescreen you and will contact you if you are eligible   Cellulitis of right knee -new following the fall on February 28, 2020  Plan -Marked the area of redness and warmth with black sharpie pen -Take cephalexin 5 mg 3 times daily x7 days -If not resolving or getting worse please call your primary care physician  Vaccine counseling  -Glad he had the flu shot but advised Covid booster at the earliest opportunity -Also recommend Shingrix vaccine by [place] [facility] of primary care physician   Follow-up -- 3-4 months do spirometry/dlco - Rreturn to see Dr Chase Caller in 3-4 months for 30-minute face-to-face visit in ILD clinic  -Return sooner if needed

## 2020-03-09 NOTE — Progress Notes (Signed)
OV 07/29/2019 -transfer of care to Dr. Chase Caller interstitial lung disease center.  Subjective:  Patient ID: [name], female , DOB: [birth year] , age 79 y.o. , MRN: [id number] , ADDRESS: [place] [id number]   07/29/2019 -   Chief Complaint  Patient presents with  . Follow-up    Pt states her breathing has become worse since last visit. States she also has an occ cough.   #Interstitial lung disease (progressive) with chronic hypoxemic respiratory failure - chronic HP dx -Per [facility] September 2016: Disease present at least since July 20, 2006  - Surgical lung biopsy  November 20, 2014 at [place]:   - local opinion: diagnostic of usual interstitial pneumonia (UIP). Interestingly, there are scattered non-necrotizing granulomata in the pleura.  -Second opinion September 2016 at [facility]: pathology was reviewed at our multidisciplinary ILD conference and noted to have a variegated pattern of fibrosis with some airway centricity, granulomas noted both near the airway and more peripherally. Minimal inflammation was noted. Features not c/w with UIP. Few giant cells with cholesterol clefts. Overall pattern most in keeping with chronic HP  -High-resolution CT chest including April 09, 2018: Lack of craniocaudal gradient and significant air trapping suggestive of chronic hypersensitivity pneumonitis [September 2014-07-20 [facility] evaluation thought CT was inconsistent with UIP]  -Clinical diagnosis of chronic hypersensitivity pneumonitis   - dx given Sept 2016 at [place]  -No clear-cut exposure identified at [facility] evaluation September 2016 by Dr. Dorothyann Peng  -Steroids recommended but patient preferred not to take it September 2016 at [place]   - deferred steroids sept 07-20-2014 visit at [facility] with Dr Sherri Sear  - nintedanib since March 2020   #Small hiatal hernia with acid reflux disease  HPI [name] 79 y.o. -is a transfer of care from Dr.  Lake Bells to Dr. Chase Caller with interstitial lung disease questionnaire.  She was last seen by Dr. Lake Bells in March 2020.  After the onset of the COVID-19 pandemic Dr. Lake Bells is now fully in charge of the Covid hospital [facility].  Therefore he is not in the outpatient medical practice anymore.  Therefore patient has been transferred to the ILD center to Dr. Chase Caller myself.  Is the first time I am meeting her and getting to know her.  She tells me that I took care of her husband for COPD a few years ago.  This was her ex-husband.  He passed away in Jul 21, 2011 after hospitalization to the ICU under our service.  She is grateful for the care.  She tells me that she has interstitial lung disease and for the last year she has been on nintedanib.  Currently she is having difficulty with nintedanib supply.  She only has 2 weeks left.  She uses 3 L of oxygen at rest.  She says she has been doing that for the last 4 years.  It is stable usage.  I am not really sure what her pulse ox on room air would be.  She says occasionally she would crank it up to 4 L for exertion but she generally uses 3 L both at rest and with exertion.  She says she tolerates nintedanib fine. Her interstitial lung disease history was reviewed and summarized above.  It appears that she was not keen on taking prednisone and her 07/20/14 visit with Dr. Dorothyann Peng at [facility].  Therefore once the [facility] data was available she was started on nintedanib a year ago.She  wants to have a son involved in her visits.  Because of the COVID-19 pandemic he is waiting outside in the car.  She says at the next visit she wants to bring him in.   Her symptom score is listed below.  Her progressive lung function decline is also listed below and the pulmonary function test reviewed.  I personally reviewed and visualized and interpreted those.  Her DLCO is paradoxically higher.  I think this is an error.   ROS - per HPI  OV  10/03/2019  Subjective:  Patient ID: [name], female , DOB: 1940-06-12 , age 29 y.o. 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 Interestingly, there are scattered non-necrotizing granulomata in the pleura.  -Second opinion September 2016 at [facility]: pathology was reviewed at our multidisciplinary ILD conference and noted to have a variegated pattern of fibrosis with some airway centricity, granulomas noted both near the airway and more peripherally. Minimal inflammation was noted. Features not c/w with UIP. Few giant cells with cholesterol clefts. Overall pattern most in keeping with chronic HP  -High-resolution CT chest including April 09, 2018: Lack of craniocaudal gradient and significant air trapping suggestive of chronic hypersensitivity pneumonitis [September 2016 [facility] evaluation thought CT was inconsistent with UIP]  -Clinical diagnosis of chronic hypersensitivity pneumonitis   - dx given Sept 2016 at [place]  -No clear-cut exposure identified at [facility] evaluation September 2016 by Dr. Dorothyann Peng  -Steroids recommended but patient preferred not to take it September 2016 at [place]   - deferred steroids sept 2016 visit at [facility] with Dr Sherri Sear  - nintedanib since March 2020   #Small hiatal hernia with acid reflux disease  10/03/2019 -   Chief Complaint  Patient presents with  . Follow-up    SOB and cough with activity has increasd, Productive cough with yellow sputum     HPI [name] 79 y.o. -returns for follow-up with her son.  At this point in time the focus is to establish the true nature of interstitial lung  disease.  She has brought her son with her.  However approximately 2 weeks ago she had acute bronchitis symptoms and called in.  Was given a cephalexin 5 days and prednisone 5 days.  These have helped immensely.  However she feels not back to baseline.  She feels she will benefit from another round of antibiotic with or without prednisone.  She finished ILD questionnaire and it is listed below.  We have not been able to discuss in a multidisciplinary case conference of 4. [place] Integrated Comprehensive ILD Questionnaire  Symptoms: See below.    SYMPTOM SCALE - ILD 07/29/2019  10/03/2019   O2 use 3L at rest, occ 4L with exertion 3L at rest and 4L with exertion. Acute bronchitis 2 weeks ago  Shortness of Breath 0 -> 5 scale with 5 being worst (score 6 If unable to do)   At rest 0 1  Simple tasks - showers, clothes change, eating, shaving 1 2  Household (dishes, doing bed, laundry) 2 3  Shopping 2 2  Walking level at own pace 1 2  Walking up Stairs 6 4  Total (30-36) Dyspnea  Score 12 14  How bad is your cough? 1 3.5  How bad is your fatigue 0 3.5  How bad is nausea 0 0  How bad is vomiting?  0 0  How bad is diarrhea? 0 0  How bad is anxiety? 1 1.5  How bad is depression 1 1     Past Medical History :  -Denies any collagen vascular disease or vasculitis.  Denies any knowledge of prior pulmonary hypertension.  Denies diabetes or thyroid disease.  Denies any stroke.  Denies mononucleosis denies tuberculosis denies pneumonia denies blood clots denies heart disease denies pleurisy   ROS: Positive for shoulder pain for the last several years but otherwise no dry eyes.  No Raynaud's.  No weight loss.  No nausea no vomiting no rash   FAMILY HISTORY of LUNG DISEASE: Denies any pulmonary fibrosis or COPD or cystic fibrosis of hypersensitive pneumonitis or any other lung disease.   EXPOSURE HISTORY: Never smoked cigarettes.  No passive smoking.  No marijuana.  No vaping no cocaine no  intravenous drug use.   HOME and HOBBY DETAILS : Current home is 25 years she is lived there for 5 years.  [place] there is no dampness.  No mold or mildew no feather pillow.  No steam iron use no Jacuzzi use no nebulizer use no humidifier use.  No pet birds or parakeets.  No pet gerbils no pet hamsters no pet rabbits no rodents.  No mold in the [facility] duct no music habits such as wind instruments no gardening.  No flood of water damage.  No strong mats.  No exposure to animals at work.   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  She also had a long-term tendency to clean her home heavily with Clorox.  She inhaled these fumes according to the son.  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   PULMONARY TOXICITY HISTORY (27 items): She has done prednisone burst.  She does not want to do long-term prednisone.  She has been on long-term nintedanib but tolerating it well.    Testing -2018 echo with elevated pulmonary systolic pressure 55 mmHg  - nov 2019 Last CT - HRCT IMPRESSION: 1. Spectrum of findings compatible with fibrotic interstitial lung disease with extensive air trapping and absence of basilar gradient, strongly favoring chronic hypersensitivity pneumonitis. Findings have slightly progressed since 04/24/2017, with more clear progression since more remote chest CT of 06/24/2014. Findings are suggestive of an alternative diagnosis (not UIP) per consensus guidelines: Diagnosis of Idiopathic Pulmonary Fibrosis: An Official ATS/ERS/JRS/ALAT Clinical Practice Guideline. [place], Iss 5, [phone], Feb 04 2017. 2. Stable mild mediastinal lymphadenopathy, most compatible with benign reactive adenopathy. 3. One vessel coronary atherosclerosis. 4.  Small hiatal hernia.  Aortic Atherosclerosis (ICD10-I70.0).     Results for VEYDA, KAUFMAN (MRN [id number]) as of 07/29/2019 11:43  Ref. Range 07/04/2014 12:58 09/03/2015 11:04 09/27/2016 13:56 05/01/2017 14:05 07/19/2019 15:54  FVC-Pre Latest Units: L 2.13 1.93 1.86 1.71 1.61  FVC-%Pred-Pre Latest Units: % 66 61 59 55 53   Results for SYRAH, DAUGHTREY (MRN [id number]) as of 07/29/2019 11:43  Ref. Range 07/04/2014 12:58 09/03/2015 11:04 09/27/2016 13:56 05/01/2017 14:05 07/19/2019 15:54  DLCO unc Latest Units:  ml/min/mmHg 13.70 14.70 12.69 11.23 17.11  DLCO unc % pred Latest Units: % 48 51 44 39 82     ROS  OV 12/03/2019  Subjective:  Patient ID: [name], female , DOB: [birth year] , age 69 y.o. , MRN: [id number] , ADDRESS: [place] [id number]   12/03/2019 -   Chief Complaint  Patient presents with  . Follow-up    cough is better but still present with exertion, breathing about the same since last visit    #Interstitial lung disease (progressive) with chronic hypoxemic respiratory failure - chronic HP dx -Per [facility] September 2016: Disease present at least since 2008  - Surgical lung biopsy  November 20, 2014 at [place]:   - local opinion: diagnostic of usual interstitial pneumonia (UIP). Interestingly, there are scattered non-necrotizing granulomata in the pleura.  -Second opinion September 2016 at [facility]: pathology was reviewed at our multidisciplinary ILD conference and noted to have a variegated pattern of fibrosis with some airway centricity, granulomas noted both near the airway and more peripherally. Minimal inflammation was noted. Features not c/w with UIP. Few giant cells with cholesterol clefts. Overall pattern most in keeping with chronic HP   - 3rd opinon: June 2021 [facility] ILD [place] - HP   -High-resolution CT chest including April 09, 2018: Lack of craniocaudal gradient and significant air trapping suggestive of  chronic hypersensitivity pneumonitis [September 2016 [facility] evaluation thought CT was inconsistent with UIP]  -Clinical diagnosis of chronic hypersensitivity pneumonitis   - dx given Sept 2016 at [place]  -No clear-cut exposure identified at [facility] evaluation September 2016 by Dr. Dorothyann Peng  -Steroids recommended but patient preferred not to take it September 2016 at [place]   - deferred steroids sept 2016 visit at [facility] with Dr Sherri Sear  - nintedanib since March 2020   #Small hiatal hernia with acid reflux disease   HPI Meili G Culbreth 79 y.o. -presents with her son for follow-up.  She continues to be on 3 L at rest 4 L with exertion.  Her acute bronchitis from April 2021 is resolved.  Her overall symptoms are stable/slightly better after her acute bronchitis.  There is no interim complaints.  In the interim we did discuss her at her case conference.  The consensus diagnosis is that she has hypersensitive pneumonitis.  The only exposure is when she was working in the [place] at in [place].  She and her son again denies any ongoing exposures.  Reviewed her pulmonary function test and it is stable compared to the recent ones.  There is progression over the last few years to several years.  In terms of a high-resolution CT chest is also stable since 2019 through 2021 but progressive when compared to earlier time points.  She was started on nintedanib in March 2020.  She is tolerating this quite well.  It appears that she and she went on nintedanib she is stable.  She yet to do pulmonary rehabilitation.  Last done few years ago.  Son feels that she needs to be more mobile She is yet to connect with the support group She did not get referred to cardiology for right heart catheterization. She wants a flutter valve for her associated bronchiectasis seen on CT scan    OV 03/09/2020   Subjective:  Patient ID: [name], female , DOB: [birth year], age 74 y.o. years. , MRN:  [id number],  ADDRESS: [place] [address] PCP  Lawerance Cruel, MD Providers : Treatment Team:  Attending Provider: Brand Males, MD   Chief Complaint  Patient presents with  . Follow-up    Patient wears 4 liters oxygen with exertion, was prescribed celebrex they want to know if it is ok with OFEV.      #Interstitial lung disease (progressive) with chronic hypoxemic respiratory failure - chronic HP dx -Per [facility] September 2016: Disease present at least since 2008  - Surgical lung biopsy  November 20, 2014 at [place]:   - local opinion: diagnostic of usual interstitial pneumonia (UIP). Interestingly, there are scattered non-necrotizing granulomata in the pleura.  -Second opinion September 2016 at [facility]: pathology was reviewed at our multidisciplinary ILD conference and noted to have a variegated pattern of fibrosis with some airway centricity, granulomas noted both near the airway and more peripherally. Minimal inflammation was noted. Features not c/w with UIP. Few giant cells with cholesterol clefts. Overall pattern most in keeping with chronic HP   - 3rd opinon: June 2021 [facility] [place] - Hypersensitivity Pneumonitis   -High-resolution CT chest including April 09, 2018: Lack of craniocaudal gradient and significant air trapping suggestive of chronic hypersensitivity pneumonitis [September 2016 [facility] evaluation thought CT was inconsistent with UIP]  -Clinical diagnosis of chronic hypersensitivity pneumonitis   - dx given Sept 2016 at [place]  -No clear-cut exposure identified at [facility] evaluation September 2016 by Dr. Dorothyann Peng  -Steroids recommended but patient preferred not to take it September 2016 at [place]   - deferred steroids sept 2016 visit at [facility] with Dr Dorothyann Peng and again at [facility] in June 2021  - nintedanib sole Rx since March 2020     HPI Renelle MERDITH ADAN 79 y.o. -returns for follow-up with  her son Simona Huh.  Since her last visit in June 2020 when she continues to be stable.  She is enrolled in the pulmonary rehabilitation program which has helped her fatigue and shortness of breath significantly.  She continues on nintedanib without any adverse side effects.  She wanted to know if it is okay to take cephalexin for arthralgia.  She has chronic osteoarthritis.  She wants to avoid NSAIDs.  In terms of oxygen use she is stable at 4 L nasal cannula.  She recently had pulmonary function test I reviewed this and it shows stability and is documented below.  Her weight continues to be stable.  We referred her for right heart catheterization with because of the Covid delta surge she deferred it.  Of note on February 28, 2020 while trying to get into the car out of her primary care physician's office she fell down and sustained abrasions and bruises injuries particularly in her lower extremity.  Since then her right kneecap patella area is red and warm.  There is a small incision injury there but this is healed.  She thinks it slightly better/stable.  She is not having any fever.  This has not been treated.    SYMPTOM SCALE - ILD 07/29/2019  10/03/2019  12/03/2019  03/09/2020   O2 use 3L at rest, occ 4L with exertion 3L at rest and 4L with exertion. Acute bronchitis 2 weeks ago 3L rest, 4 L exertion 3-4L [place]. Doing rehab  Shortness of Breath 0 -> 5 scale with 5 being worst (score 6 If unable to do)     At rest 0 1 0 0  Simple tasks - showers, clothes change, eating, shaving _0 2  Household (dishes, doing bed, laundry) _0 Shopping _1 Walking level at own pace _2 Walking up [place] _3 Total (30-36) Dyspnea Score _4 How bad is your cough? 1 3.5  More often [place]  How bad is your fatigue 0 3.5 2 Better with exercise  How bad is nausea 0 0 0 0  How bad is vomiting?  0 0 0 0  How bad is diarrhea? 0 0 0 0  How bad is anxiety? 1 1._5 How bad is  depression _6 PFT Results Latest Ref Rng & Units 03/05/2020 12/03/2019 07/19/2019 05/01/2017 09/27/2016 09/03/2015 07/04/2014  FVC-Pre L 1.70 1.66 1.61 1.71 1.86 1.93 2.13  FVC-Predicted Pre % 57 55 53 55 59 61 66  FVC-Post L - - 1.87 1.72 1.91 2.03 2.26  FVC-Predicted Post % - - 61 55 61 64 70  Pre FEV1/FVC % % 81 82 81 87 86 87 89  Post FEV1/FCV % % - - 78 90 89 88 88  FEV1-Pre L 1.39 1.36 1.31 1.50 1.60 1.68 1.90  FEV1-Predicted Pre % 62 60 57 64 68 70 78  FEV1-Post L - - 1.46 1.56 1.70 1.78 2.00  DLCO uncorrected ml/min/mmHg 15.25 13.20 17.11 11.23 12.69 14.70 13.70  DLCO UNC% % 73 63 82 39 44 51 48  DLCO corrected ml/min/mmHg 15.25 13.20 - 10.63 12.07 14.09 -  DLCO COR %Predicted % 73 63 - 37 42 49 -  DLVA Predicted % 114 103 120 70 80 78 79  TLC L - - 3.63 3.37 3.49 - 4.00  TLC % Predicted % - - 65 61 63 - 72  RV % Predicted % - - 73 63 64 - 58     ROS - per HPI  CT chest high resolution - may 2021  IMPRESSION: 1. Spectrum of findings compatible with fibrotic interstitial lung disease with moderate air trapping and absence of apicobasilar gradient. Findings are most compatible with chronic hypersensitivity pneumonitis. No interval progression since 2019 chest CT. Evidence of progression since baseline 2016 chest CT. Findings are suggestive of an alternative diagnosis (not UIP) per consensus guidelines: Diagnosis of Idiopathic Pulmonary Fibrosis: An Official ATS/ERS/JRS/ALAT Clinical Practice Guideline. [place], Iss 5, [phone], Feb 04 2017. 2. Stable mild mediastinal lymphadenopathy, compatible with benign reactive adenopathy. 3. Small hiatal hernia. 4. Aortic Atherosclerosis (ICD10-I70.0).   Electronically Signed   By: Ilona Sorrel M.D.   On: 10/24/2019 15:02    has a past medical history of Allergic rhinitis, Anxiety, Arthritis, Asthma, Carpal tunnel syndrome, CHF (congestive heart failure) ([place]), GERD (gastroesophageal  reflux disease), History of bronchitis (3-33yr ago), History of colon polyps, Joint pain, Peripheral edema, Pneumonitis, hypersensitivity ([place] (05/2016), Pulmonary hypertension ([place] (06/30/2017), Shortness of breath dyspnea, and Thyroid nodule.   reports that she has never smoked. She has never used smokeless tobacco.  Past Surgical History:  Procedure Laterality Date  . BREAST CYST EXCISION Left 50 yrs ago  . CARPELL TUNNELL SURG Right   . COLONOSCOPY    . ESOPHAGOGASTRODUODENOSCOPY    . EYE SURGERY     bilateral cataract surgery with lens implants  . KNEE ARTHROSCOPY W/ MENISCAL REPAIR     left  . LUNG BIOPSY Right 11/20/2014   Procedure: LUNG BIOPSY;  Surgeon: SMelrose Nakayama MD;  [facility];  Service: Thoracic;  Laterality: Right;  . REVERSE SHOULDER ARTHROPLASTY Right 06/07/2018   Procedure: REVERSE SHOULDER ARTHROPLASTY;  Surgeon: Justice Britain, MD;  Location: [facility] ORS;  Service: Orthopedics;  Laterality: Right;  1101mn  . TOTAL KNEE ARTHROPLASTY Left 08/13/2013   Procedure: LEFT TOTAL KNEE ARTHROPLASTY;  Surgeon: MMauri Pole MD;  Location: [facility] ORS;  Service: Orthopedics;  Laterality: Left;  . UMBILICAL HERNIA REPAIR    . VIDEO ASSISTED THORACOSCOPY Right 11/20/2014   Procedure: RIGHT  VIDEO ASSISTED THORACOSCOPY WITH WEDGE LUNG BIOPSIES RIGHT UPPER,MIDDLE AND LOWER LOBES ,PLACEMENT OF ON Q PAIN PUMP;  Surgeon: SMelrose Nakayama MD;  [place] [facility]  Service: Thoracic;  Laterality: Right;    Allergies  Allergen Reactions  . Adhesive [Tape] Itching and Rash  . Latex Itching and Swelling  . Doxycycline Rash  . Sulfonamide Derivatives Other (See Comments)    Unknown    Immunization History  Administered Date(s) Administered  . Fluad Quad(high Dose 65+) 02/07/2019  . Influenza, High Dose Seasonal PF 04/20/2016, 02/23/2017, 02/12/2018  . Influenza-Unspecified 03/06/2014, 02/18/2015  . PFIZER SARS-COV-2 Vaccination 07/12/2019, 08/06/2019  . Pneumococcal  Conjugate-13 03/06/2014  . Tdap 06/14/2017    Family History  Problem Relation Age of Onset  . Tuberculosis Father   . Tuberculosis Paternal Uncle      Current Outpatient Medications:  .  albuterol (VENTOLIN HFA) 108 (90 Base) MCG/ACT inhaler, Inhale 2 puffs into the lungs every 6 (six) hours as needed for wheezing or shortness of breath., Disp: 18 g, Rfl: 6 .  aspirin 325 MG tablet, Take 650 mg by mouth daily as needed for moderate pain or headache., Disp: , Rfl:  .  Cholecalciferol (DIALYVITE VITAMIN D 5000) 125 MCG (5000 UT) capsule, Take 5,000 Units by mouth daily., Disp: , Rfl:  .  fluticasone (FLONASE) 50 MCG/ACT nasal spray, Place 1 spray into both nostrils daily as needed for allergies. , Disp: , Rfl:  .  ibuprofen (ADVIL) 200 MG tablet, Take 200-400 mg by mouth every 6 (six) hours as needed for headache or moderate pain., Disp: , Rfl:  .  loratadine (CLARITIN) 5 MG chewable tablet, Chew 5-10 mg by mouth daily as needed for allergies. , Disp: , Rfl:  .  Multiple Vitamin (MULTIVITAMIN WITH MINERALS) TABS tablet, Take 1 tablet by mouth daily., Disp: , Rfl:  .  OFEV 150 MG CAPS, TAKE 1 CAPSULE (150 MG TOTAL) BY MOUTH 2 (TWO) TIMES DAILY., Disp: 60 capsule, Rfl: 2 .  Respiratory Therapy Supplies (FLUTTER) DEVI, Use as directed., Disp: 1 each, Rfl: 0 .  sodium chloride HYPERTONIC 3 % nebulizer solution, USE CONTENTS OF 1 VIAL IN NEBULIZER 2 TIMES A DAY. (Patient taking differently: Take 4 mLs by nebulization daily. ), Disp: 480 mL, Rfl: 0 .  vitamin B-12 (CYANOCOBALAMIN) 1000 MCG tablet, Take 1,000 mcg by mouth every other day. , Disp: , Rfl:       Objective:   Vitals:   03/09/20 1121  BP: 132/60  Pulse: 91  Temp: 97.7 F (36.5 C)  TempSrc: Temporal  SpO2: 95%  Weight: 215 lb 6.4 oz (97.7 kg)  Height: 5' 6.5" (1.689 m)    Estimated body mass index is 34.25 kg/m as calculated from the following:   Height as of this encounter: 5' 6.5" (1.689 m).   Weight as of this  encounter: 215 lb 6.4 oz (97.7 kg).  _0 @  Filed Weights   03/09/20 1121  Weight: 215 lb 6.4 oz (97.7 kg)  Physical Exam  General Appearance:    Alert, cooperative, no distress, appears stated age - yes , Deconditioned looking - improvedd , OBESE  - yes, Sitting on Wheelchair -  no  Head:    Normocephalic, without obvious abnormality, atraumatic  Eyes:    PERRL, conjunctiva/corneas clear,  Ears:    Normal TM's and external ear canals, both ears  Nose:   Nares normal, septum midline, mucosa normal, no drainage    or sinus tenderness. OXYGEN ON  - yes . Patient is @ 4LNC   Throat:   Lips, mucosa, and tongue normal; teeth and gums normal. Cyanosis on lips - no  Neck:   Supple, symmetrical, trachea midline, no adenopathy;    thyroid:  no enlargement/tenderness/nodules; no carotid   bruit or JVD  Back:     Symmetric, no curvature, ROM normal, no CVA tenderness  Lungs:     Distress - no , Wheeze no, Barrell Chest - no, Purse lip breathing - no, Crackles - some crackles   Chest Wall:    No tenderness or deformity.    Heart:    Regular rate and rhythm, S1 and S2 normal, no rub   or gallop, Murmur - no  Breast Exam:    NOT DONE  Abdomen:     Soft, non-tender, bowel sounds active all four quadrants,    no masses, no organomegaly. Visceral obesity - yes  Genitalia:   NOT DONE  Rectal:   NOT DONE  Extremities:   Extremities - normal, Has Cane - no, Clubbing - no, Edema - no  Pulses:   2+ and symmetric all extremities  Skin:   Stigmata of Connective Tissue Disease - no  Lymph nodes:   Cervical, supraclavicular, and axillary nodes normal  Psychiatric:  Neurologic:   Pleasant - yes, Anxious - no, Flat affect - no  CAm-ICU - neg, Alert and Oriented x 3 - yes, Moves all 4s - yes, Speech - normal, Cognition - intact           Assessment:       ICD-10-CM   1. 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 Interstitial lung disease ([place])   J84.9   3. Chronic respiratory failure with hypoxia (HCC)  J96.11   4. Therapeutic drug monitoring  Z51.81   5. Cellulitis of right knee  L03.115   6. Vaccine counseling  Z71.85        Plan:     Patient Instructions  Hypersensitivity pneumonitis due to unspecified organic dust ([place]) Interstitial lung disease ([place])  - his is a slowly progressive over time but stable 2019/2020 -> 2021 Chronic respiratory failure with hypoxia [facility]) Therapeutic drug monitoring  -Clinically stable disease since June 2021 -Glad pulmonary rehabilitation is helping you -Respect deferral on right heart catheterization because of the COVID-19 delta surge -Glad to establish contact with Mr. Marlane Mingle of the pulmonary fibrosis support group   Plan   -Continue nintedanib as before but will hold off on any other medication  -Okay to do cephalexin for arthralgia - Check LFT 03/09/2020 -Hold off on cardiology referral for pulmonary hypertension evaluation -Continue pulmonary rehabilitation at [place] involvement with the support group through [place] interested in ILD-pro registry study through [facility].  Research staff will prescreen you and will contact you if you are eligible   Cellulitis of right knee -new following the fall on February 28, 2020  Plan -Marked the area of  redness and warmth with black sharpie pen -Take cephalexin 5 mg 3 times daily x7 days -If not resolving or getting worse please call your primary care physician  Vaccine counseling  -Glad he had the flu shot but advised Covid booster at the earliest opportunity -Also recommend Shingrix vaccine by [place] [facility] of primary care physician   Follow-up -- 3-4 months do spirometry/dlco - Rreturn to see Dr Chase Caller in 3-4 months for 30-minute face-to-face visit in ILD clinic  -Return sooner if needed   ( Level 05 visit: Estb 40-54 min   in  visit type: on-site physical face to visit  in total care time and  counseling or/and coordination of care by this undersigned MD - Dr Brand Males. This includes one or more of the following on this same day 03/09/2020: pre-charting, chart review, note writing, documentation discussion of test results, diagnostic or treatment recommendations, prognosis, risks and benefits of management options, instructions, education, compliance or risk-factor reduction. It excludes time spent by the [place] or office staff in the care of the patient. Actual time 49 min)   SIGNATURE    Dr. Brand Males, M.D., F.C.C.P,  Pulmonary and Critical Care Medicine Staff Physician, [place] Director - Interstitial Lung Disease  Program  [facility] [place] [place], [place], [address]  Pager: [phone], If no answer or between  15:00h - 7:00h: call [phone] Telephone: [phone]  12:05 PM 03/09/2020

## 2020-03-10 ENCOUNTER — Encounter (HOSPITAL_COMMUNITY): Payer: Medicare Other

## 2020-03-10 NOTE — Progress Notes (Signed)
LFT normal on ofev. Will not call with normal results. Continue ofev

## 2020-03-12 ENCOUNTER — Telehealth (HOSPITAL_COMMUNITY): Payer: Self-pay | Admitting: *Deleted

## 2020-03-12 ENCOUNTER — Encounter (HOSPITAL_COMMUNITY): Admission: RE | Admit: 2020-03-12 | Payer: Medicare Other | Source: Ambulatory Visit

## 2020-03-14 DIAGNOSIS — S8001XA Contusion of right knee, initial encounter: Secondary | ICD-10-CM | POA: Diagnosis not present

## 2020-03-17 ENCOUNTER — Telehealth (HOSPITAL_COMMUNITY): Payer: Self-pay | Admitting: Family Medicine

## 2020-03-17 ENCOUNTER — Encounter (HOSPITAL_COMMUNITY): Payer: Medicare Other

## 2020-03-17 DIAGNOSIS — L03115 Cellulitis of right lower limb: Secondary | ICD-10-CM | POA: Diagnosis not present

## 2020-03-19 ENCOUNTER — Encounter (HOSPITAL_COMMUNITY): Payer: Self-pay | Admitting: *Deleted

## 2020-03-19 ENCOUNTER — Telehealth (HOSPITAL_COMMUNITY): Payer: Self-pay | Admitting: Family Medicine

## 2020-03-19 ENCOUNTER — Encounter (HOSPITAL_COMMUNITY): Payer: Medicare Other

## 2020-03-19 NOTE — Progress Notes (Signed)
Called pt to discuss Pulmonary Rehab. [name] has only been able 12 sessions. Now she has had a fall and injured her knee. Her knee has been swollen and infected. I talked with her today and she is now on a second antibiotic and she states that it is still swollen. After our discussion we both agree that it would be best for [place] to discharge her now. I explained to her that when her knee has healed to call us and we will get her back in the program.

## 2020-03-24 ENCOUNTER — Ambulatory Visit (HOSPITAL_COMMUNITY): Payer: Medicare Other

## 2020-03-26 ENCOUNTER — Ambulatory Visit (HOSPITAL_COMMUNITY): Payer: Medicare Other

## 2020-03-31 ENCOUNTER — Ambulatory Visit (HOSPITAL_COMMUNITY): Payer: Medicare Other

## 2020-04-01 NOTE — Addendum Note (Signed)
Encounter addended by: Rowe Pavy, RN on: 04/01/2020 8:13 PM  Actions taken: Episode resolved, Flowsheet data copied forward, Flowsheet accepted, Clinical Note Signed

## 2020-04-01 NOTE — Progress Notes (Signed)
Discharge Progress Report  Patient Details  Name: [name] MRN: [id number] Date of Birth: 1940-12-08 Referring Provider:     Pulmonary Rehab Walk Test from 01/14/2020 in [place]  Referring Provider Dr. Chase Caller       Number of Visits: 12   Reason for Discharge:  Early Exit:  infected and swollen knee as a result of a fall  Smoking History:  Social History   Tobacco Use  Smoking Status Never Smoker  Smokeless Tobacco Never Used    Diagnosis:  ILD (interstitial lung disease) ([place])  ADL UCSD:  Pulmonary Assessment Scores    Row Name 01/14/20 1634         ADL UCSD   ADL Phase Entry       mMRC Score   mMRC Score 3            Initial Exercise Prescription:  Initial Exercise Prescription - 01/14/20 1600      Date of Initial Exercise RX and Referring Provider   Date 01/14/20    Referring Provider Dr. Chase Caller      Oxygen   Oxygen Continuous    Liters 4      NuStep   Level 2    SPM 80    Minutes 15      Track   Laps 10    Minutes 15      Prescription Details   Frequency (times per week) 2    Duration Progress to 30 minutes of continuous aerobic without signs/symptoms of physical distress      Intensity   THRR 40-80% of Max Heartrate 56-112    Ratings of Perceived Exertion 11-13    Perceived Dyspnea 0-4      Resistance Training   Training Prescription Yes    Weight orange bands    Reps 10-15           Discharge Exercise Prescription (Final Exercise Prescription Changes):  Exercise Prescription Changes - 03/03/20 1600      Response to Exercise   Blood Pressure (Admit) 128/64    Blood Pressure (Exit) 120/80    Heart Rate (Admit) 79 bpm    Heart Rate (Exercise) 111 bpm    Heart Rate (Exit) 85 bpm    Oxygen Saturation (Admit) 98 %    Oxygen Saturation (Exercise) 94 %    Oxygen Saturation (Exit) 99 %    Rating of Perceived Exertion (Exercise) 11    Perceived Dyspnea (Exercise) 1    Duration  Continue with 30 min of aerobic exercise without signs/symptoms of physical distress.    Intensity THRR unchanged      Progression   Progression Continue to progress workloads to maintain intensity without signs/symptoms of physical distress.    Average METs 1.6      Resistance Training   Training Prescription Yes    Weight orange bands    Reps 10-15    Time 10 Minutes      Oxygen   Oxygen Continuous    Liters 4      NuStep   Level 2    SPM 80    Minutes 15    METs 1.6      Track   Laps 8    Minutes 15           Functional Capacity:  6 Minute Walk    Row Name 01/14/20 1626         6 Minute Walk   Phase Initial  Distance 1046 feet     Walk Time 6 minutes     # of Rest Breaks 0     MPH 1.98     METS 2.25     RPE 12     Perceived Dyspnea  2     VO2 Peak 7.87     Symptoms Yes (comment)     Comments R knee pain 3/10     Resting HR 82 bpm     Resting BP 128/84     Resting Oxygen Saturation  99 %     Exercise Oxygen Saturation  during 6 min walk 92 %     Max Ex. HR 130 bpm     Max Ex. BP 180/82     2 Minute Post BP 156/84       Interval HR   1 Minute HR 110     2 Minute HR 123     3 Minute HR 125     4 Minute HR 128     5 Minute HR 128     6 Minute HR 130     2 Minute Post HR 92     Interval Heart Rate? Yes       Interval Oxygen   Interval Oxygen? Yes     Baseline Oxygen Saturation % 99 %     1 Minute Oxygen Saturation % 98 %     1 Minute Liters of Oxygen 4 L     2 Minute Oxygen Saturation % 93 %     2 Minute Liters of Oxygen 4 L     3 Minute Oxygen Saturation % 92 %     3 Minute Liters of Oxygen 4 L     4 Minute Oxygen Saturation % 92 %     4 Minute Liters of Oxygen 4 L     5 Minute Oxygen Saturation % 92 %     5 Minute Liters of Oxygen 4 L     6 Minute Oxygen Saturation % 92 %     6 Minute Liters of Oxygen 4 L     2 Minute Post Oxygen Saturation % 98 %     2 Minute Post Liters of Oxygen 4 L            Psychological, QOL, Others -  Outcomes: PHQ 2/9: Depression screen [facility] 2/9 01/14/2020 12/13/2016 07/18/2016 02/16/2015  Decreased Interest 0 0 0 0  Down, Depressed, Hopeless 0 0 0 0  PHQ - 2 Score 0 0 0 0  Altered sleeping 0 - - -  Tired, decreased energy 2 - - -  Change in appetite 1 - - -  Feeling bad or failure about yourself  1 - - -  Trouble concentrating 0 - - -  Moving slowly or fidgety/restless 0 - - -  Suicidal thoughts 0 - - -  PHQ-9 Score 4 - - -  Difficult doing work/chores Not difficult at all - - -  Some recent data might be hidden    Quality of Life:   Personal Goals: Goals established at orientation with interventions provided to work toward goal.  Personal Goals and Risk Factors at Admission - 01/14/20 1512      Core Components/Risk Factors/Patient Goals on Admission    Weight Management Weight Loss   not a priority for this pt due to her age, but okay if lost weight   Improve shortness of breath with ADL's Yes    Intervention Provide education,  individualized exercise plan and daily activity instruction to help decrease symptoms of SOB with activities of daily living.    Expected Outcomes Short Term: Improve cardiorespiratory fitness to achieve a reduction of symptoms when performing ADLs;Long Term: Be able to perform more ADLs without symptoms or delay the onset of symptoms    Personal Goal Other Yes    Personal Goal My granddaughter who is 94 graduating from high school this spring.  Grandchild due in October and February.  I would like to be able to see these events with less shortness of breath, increase energy and stamina.  Pt would like to be independent and not have to live in a nursing home.    Intervention continue with consistent attendance to pulmonary rehab as well as home exercise dilgent with keeping activie on off days from rehab            Personal Goals Discharge:  Goals and Risk [place] Name 01/14/20 1517 02/12/20 2206 02/12/20 2211 03/03/20 1606 04/01/20 2001       Core Components/Risk Factors/Patient Goals Review   Personal Goals Review -- Improve shortness of breath with ADL's;Develop more efficient breathing techniques such as purse lipped breathing and diaphragmatic breathing and practicing self-pacing with activity. -- Improve shortness of breath with ADL's;Develop more efficient breathing techniques such as purse lipped breathing and diaphragmatic breathing and practicing self-pacing with activity.;Increase knowledge of respiratory medications and ability to use respiratory devices properly. --   Review -- [name] has completed  6 exercise sessions. she is making good progress despite her orthopedic challenges.  Pt need verbal cues to perform PLB technique. Will continue to move pt toward independence in demonstrating breathing techniques [name] has completed  6 exercise sessions. she is making good progress despite her orthopedic challenges.  Pt need verbal cues to perform PLB technique. Will continue to move pt toward independence in demonstrating breathing techniques. Ptis looking foward to new grandchild next month. [name] has completed  12 exercise sessions. she is making good progress despite her orthopedic challenges. Pt with minor setback in her progress due to a fall at her primary MD office.  When able would like to see increase in the nustep to level 3 [name] tripped over a cement parking bumper.  [name] is looking foward to new grandchild in October. [name] was dischaged due to competing medical issues with the completion of 12 exercise sessions.   Expected Outcomes -- See Admission Goals -- See Admission Goals unable to met admission goals due to short period of time   Row Name 04/01/20 2008             Core Components/Risk Factors/Patient Goals Review   Review [name] was unable to return to pulmonary rehab due to infection to her knee as a result of a fall.  [name] completed 12 exercise sessions.              Exercise Goals and  Review:  Exercise Goals    Row Name 01/14/20 1457 01/14/20 1637           Exercise Goals   Increase Physical Activity -- Yes      Intervention -- Provide advice, education, support and counseling about physical activity/exercise needs.;Develop an individualized exercise prescription for aerobic and resistive training based on initial evaluation findings, risk stratification, comorbidities and participant's personal goals.      Expected Outcomes -- Short Term: Attend rehab on a regular basis to increase amount of physical activity.;Long  Term: Add in home exercise to make exercise part of routine and to increase amount of physical activity.;Long Term: Exercising regularly at least 3-5 days a week.      Increase Strength and Stamina Yes Yes      Intervention Provide advice, education, support and counseling about physical activity/exercise needs.;Develop an individualized exercise prescription for aerobic and resistive training based on initial evaluation findings, risk stratification, comorbidities and participant's personal goals. Provide advice, education, support and counseling about physical activity/exercise needs.;Develop an individualized exercise prescription for aerobic and resistive training based on initial evaluation findings, risk stratification, comorbidities and participant's personal goals.      Expected Outcomes Short Term: Increase workloads from initial exercise prescription for resistance, speed, and METs.;Short Term: Perform resistance training exercises routinely during rehab and add in resistance training at home;Long Term: Improve cardiorespiratory fitness, muscular endurance and strength as measured by increased METs and functional capacity (6MWT) Short Term: Increase workloads from initial exercise prescription for resistance, speed, and METs.;Short Term: Perform resistance training exercises routinely during rehab and add in resistance training at home;Long Term: Improve  cardiorespiratory fitness, muscular endurance and strength as measured by increased METs and functional capacity (6MWT)      Able to understand and use rate of perceived exertion (RPE) scale Yes Yes      Intervention Provide education and explanation on how to use RPE scale Provide education and explanation on how to use RPE scale      Expected Outcomes Long Term:  Able to use RPE to guide intensity level when exercising independently;Short Term: Able to use RPE daily in rehab to express subjective intensity level Long Term:  Able to use RPE to guide intensity level when exercising independently;Short Term: Able to use RPE daily in rehab to express subjective intensity level      Able to understand and use Dyspnea scale Yes Yes      Intervention Provide education and explanation on how to use Dyspnea scale Provide education and explanation on how to use Dyspnea scale      Expected Outcomes Short Term: Able to use Dyspnea scale daily in rehab to express subjective sense of shortness of breath during exertion;Long Term: Able to use Dyspnea scale to guide intensity level when exercising independently Short Term: Able to use Dyspnea scale daily in rehab to express subjective sense of shortness of breath during exertion;Long Term: Able to use Dyspnea scale to guide intensity level when exercising independently      Knowledge and understanding of Target Heart Rate Range (THRR) -- Yes      Intervention -- Provide education and explanation of THRR including how the numbers were predicted and where they are located for reference      Expected Outcomes -- Short Term: Able to state/look up THRR;Short Term: Able to use daily as guideline for intensity in rehab;Long Term: Able to use THRR to govern intensity when exercising independently      Understanding of Exercise Prescription Yes Yes      Intervention Provide education, explanation, and written materials on patient's individual exercise prescription Provide  education, explanation, and written materials on patient's individual exercise prescription      Expected Outcomes Short Term: Able to explain program exercise prescription;Long Term: Able to explain home exercise prescription to exercise independently Short Term: Able to explain program exercise prescription;Long Term: Able to explain home exercise prescription to exercise independently             Exercise Goals Re-Evaluation:  Exercise Goals Re-Evaluation    [place] Name 02/13/20 1622 03/02/20 1629           Exercise Goal Re-Evaluation   Exercise Goals Review Increase Physical Activity;Increase Strength and Stamina;Able to understand and use rate of perceived exertion (RPE) scale;Able to understand and use Dyspnea scale;Knowledge and understanding of Target Heart Rate Range (THRR);Able to check pulse independently;Understanding of Exercise Prescription;Improve claudication pain tolerance and improve walking ability Increase Physical Activity;Increase Strength and Stamina;Able to understand and use rate of perceived exertion (RPE) scale;Able to understand and use Dyspnea scale;Knowledge and understanding of Target Heart Rate Range (THRR);Understanding of Exercise Prescription      Comments Pt has completed 7 exercise sessions. Each session she has completed she has complained of Knee and Hip pain due to arthritis. She is slowly making progress on the Nustep and walking on the track. She is exercising at 1.6 METs on the Nustep. We will continue to progress and monitor as able. pt has completed 11 exercise sessions. Pt has complaints of knee and hip pain with exercise due to chronic arthritis. She has made progressions with the Nustep and walking around the track. She is exercising at 1.6 METS on the Nustep and 2.39 METS on the track      Expected Outcomes Through exercise at rehab and home the patient will decrease shortness of breath with daily activities and feel confident in carrying out an exercise  regimn at home Through exercise at rehab and home the patient will decrease shortness of breath with daily activities and feel confident in carrying out an exercise regimn at home             Nutrition & Weight - Outcomes:  Pre Biometrics - 01/14/20 1638      Pre Biometrics   Grip Strength 36 kg            Nutrition:  Nutrition Therapy & Goals - 01/28/20 1427      Nutrition Therapy   Diet Therapeutic Lifestyle Changes      Personal Nutrition Goals   Nutrition Goal Pt to build a healthy plate including vegetables, fruits, whole grains, and low-fat dairy products in a healthy meal plan.    Personal Goal #2 Pt to continue to eat 5-6 small meals throughout day    Personal Goal #3 Pt to eat main meal later in the afternoon when she has the most energy    Personal Goal #4 Pt to continue to eat protein rich snack with Cucumber, educate and counsel regarding individualized specific dietary modifications aiming towards targeted core components such as weight, hypertension, lipid management, diabetes, heart failure and other comorbidities.    Expected Outcomes Short Term Goal: Understand basic principles of dietary content, such as calories, fat, sodium, cholesterol and nutrients.           Nutrition Discharge:   Education Questionnaire Score:   [name] unable to complete post assessments.

## 2020-04-02 ENCOUNTER — Ambulatory Visit (HOSPITAL_COMMUNITY): Payer: Medicare Other

## 2020-04-09 DIAGNOSIS — Z23 Encounter for immunization: Secondary | ICD-10-CM | POA: Diagnosis not present

## 2020-06-02 ENCOUNTER — Other Ambulatory Visit: Payer: Self-pay | Admitting: Internal Medicine

## 2020-06-12 ENCOUNTER — Other Ambulatory Visit: Payer: Self-pay | Admitting: Adult Health

## 2020-06-12 DIAGNOSIS — J479 Bronchiectasis, uncomplicated: Secondary | ICD-10-CM

## 2020-07-22 DIAGNOSIS — L304 Erythema intertrigo: Secondary | ICD-10-CM | POA: Diagnosis not present

## 2020-07-22 DIAGNOSIS — D225 Melanocytic nevi of trunk: Secondary | ICD-10-CM | POA: Diagnosis not present

## 2020-07-22 DIAGNOSIS — I788 Other diseases of capillaries: Secondary | ICD-10-CM | POA: Diagnosis not present

## 2020-07-22 DIAGNOSIS — D692 Other nonthrombocytopenic purpura: Secondary | ICD-10-CM | POA: Diagnosis not present

## 2020-07-22 DIAGNOSIS — L720 Epidermal cyst: Secondary | ICD-10-CM | POA: Diagnosis not present

## 2020-07-22 DIAGNOSIS — L814 Other melanin hyperpigmentation: Secondary | ICD-10-CM | POA: Diagnosis not present

## 2020-07-22 DIAGNOSIS — L738 Other specified follicular disorders: Secondary | ICD-10-CM | POA: Diagnosis not present

## 2020-07-22 DIAGNOSIS — D1801 Hemangioma of skin and subcutaneous tissue: Secondary | ICD-10-CM | POA: Diagnosis not present

## 2020-07-22 DIAGNOSIS — L821 Other seborrheic keratosis: Secondary | ICD-10-CM | POA: Diagnosis not present

## 2020-08-03 ENCOUNTER — Telehealth: Payer: Self-pay | Admitting: Internal Medicine

## 2020-08-03 NOTE — Telephone Encounter (Signed)
Called and spoke with pt who stated she has complaints of cough with yellow phlegm and chest tightness which began about 1 week ago. Pt also had complaints of diarrhea which she thinks was due to OFEV medication she was taking. Pt said due to the diarrhea, she stopped the med for 1 day but since the diarrhea kept happening, she went back on the [facility] and is still taking it.   Pt denies any complaints of fever, states that she has not checked temp recently but does not feel feverish. Pt has not had a covid test within last 5 days.  Pt does use her rescue inhaler every night before she goes to bed and also might use it a couple times during the day if needed. Pt does use the sodium chloride neb sol twice a day.  Stated to pt that we should schedule her for a televisit to further evaluate and she verbalized understanding. appt has been scheduled for pt tomorrow 3/1 at 12pm with TP. Nothing further needed.

## 2020-08-04 ENCOUNTER — Other Ambulatory Visit: Payer: Self-pay

## 2020-08-04 ENCOUNTER — Ambulatory Visit (INDEPENDENT_AMBULATORY_CARE_PROVIDER_SITE_OTHER): Payer: Medicare Other | Admitting: Adult Health

## 2020-08-04 DIAGNOSIS — J849 Interstitial pulmonary disease, unspecified: Secondary | ICD-10-CM | POA: Diagnosis not present

## 2020-08-04 DIAGNOSIS — J209 Acute bronchitis, unspecified: Secondary | ICD-10-CM

## 2020-08-04 DIAGNOSIS — J479 Bronchiectasis, uncomplicated: Secondary | ICD-10-CM

## 2020-08-04 MED ORDER — CEFDINIR 300 MG PO CAPS
300.0000 mg | ORAL_CAPSULE | Freq: Two times a day (BID) | ORAL | 0 refills | Status: DC
Start: 2020-08-04 — End: 2020-08-26

## 2020-08-04 NOTE — Progress Notes (Signed)
Good grooming virtual Visit via Telephone Note  I connected with [name] on 08/04/20 at 12:00 PM EST by telephone and verified that I am speaking with the correct person using two identifiers.  Location: Patient: Home  Provider: Home    I discussed the limitations, risks, security and privacy concerns of performing an evaluation and management service by telephone and the availability of in person appointments. I also discussed with the patient that there may be a patient responsible charge related to this service. The patient expressed understanding and agreed to proceed.   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  Patient complains over the last 2 weeks she has had increased cough and congestion.  Feels that her cough and congestion are getting worse.  She has had some associated diarrhea as well.  She had no increased oxygen demands.  Remains on 3 to 4 L of oxygen.  She has had no known sick contacts.  She is fully vaccinated for COVID-19.  However did not test for COVID-19 with her acute respiratory symptoms as above.   Past Medical History:  Diagnosis Date  . Allergic rhinitis    takes Claritin nightly  . Anxiety    but not on any meds  . Arthritis   . Asthma   . Carpal tunnel syndrome   . CHF (congestive heart failure) ([place])    patient denies-in office note 05/2015 by Aleene Davidson  . GERD (gastroesophageal reflux disease)   . History of bronchitis 3-14yr ago  . History of colon polyps    benign  . Joint pain   . Peripheral edema    takes Lasix daily  . [place]   chronic hypersensitivity pneumonitis- Dr. MSpero Curb . [place]    patient denies-in office note from Dr. MSpero Curb  . Shortness of breath dyspnea    with exertion;Albuterol inhaler as needed  . Thyroid nodule    Current Outpatient Medications on File Prior to Visit  Medication Sig Dispense Refill  . albuterol (VENTOLIN HFA) 108 (90 Base) MCG/ACT inhaler Inhale 2 puffs into the lungs every 6 (six) hours as needed for wheezing or shortness of breath. 18 g 6  . aspirin 325 MG tablet Take 650 mg by mouth daily as needed for moderate pain or headache.    . fluticasone (FLONASE) 50 MCG/ACT nasal spray Place 1 spray into both nostrils daily as needed for allergies.     .Marland Kitchenibuprofen (ADVIL) 200 MG tablet Take 200-400 mg by mouth every 6 (six) hours as needed for headache or moderate pain.    .Marland Kitchenloratadine (CLARITIN) 5 MG chewable tablet Chew 5-10 mg by mouth daily as needed for allergies.     .Marland KitchenOFEV 150 MG CAPS TAKE 1 CAPSULE (150 MG TOTAL) BY MOUTH TWO TIMES DAILY. 60 capsule 3  . Respiratory Therapy Supplies (FLUTTER) DEVI Use as directed. 1 each 0  . sodium chloride HYPERTONIC 3 % nebulizer solution Take 4 mLs by nebulization daily. 480 mL 0  . sodium chloride HYPERTONIC 3 % nebulizer solution Take 4 mLs by nebulization daily. 480 mL 0  . Cholecalciferol (DIALYVITE VITAMIN D 5000) 125 MCG (5000 UT) capsule Take 5,000 Units by mouth daily. (Patient not taking: Reported on 08/04/2020)    .  Multiple Vitamin (MULTIVITAMIN WITH MINERALS) TABS tablet Take 1 tablet by mouth daily. (Patient not taking: Reported on 08/04/2020)    . vitamin B-12 (CYANOCOBALAMIN) 1000 MCG tablet Take 1,000 mcg by mouth every other day.  (Patient not taking: Reported on 08/04/2020)     No current facility-administered medications on file prior to visit.    Observations/Objective: Speaks in full sentences with no audible distress /2018 CT angiogram> no PE, superimposed ground glass changes in addition to baseline pulmonary fibrosis.    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  PFT's January 2016: FVC 2.13 (66%), no obstruction, TLC 4.00 (72%), DLCO 13.70 (48%) March 2017 pulmonary function testing ratio 88%, FVC 2.03 L (84% predicted), total lung capacity 3.0 L (54% predicted), DLCO 14.7 (51% predicted) April 2018 ratio 89%, FVC 1.91 L 61% predicted, total lung capacity 3.49 L 63% predicted, DLCO 12.69 44% predicted November 2018 pulmonary function testing ratio 90%, FVC 1.72 L 55% predicted, total lung capacity 3.37 L 61% predicted, DLCO 11.2 39% predicted  6MW 02/2015 6 Min Walk: 1218 feet. The patient's lowest oxygen saturation was 90 %, highest heart rate was 118 bpm , and highest blood pressure was 180/80 07/2016 6 MW> 1320 feet 3 L O2 88%  Chest imaging: CT chest 2008: Mild bronchiectasis, ?NSIP: CXR 05/2014: Progressive IS changes compared to 2012. HRCT 04/2017: traction bronchiectasis and bronchovascular and peripheral fibrotic changes, worse in the upper lobes, some air trapping, no significant progression compared to 09/2016  HRCT 2016: Changes c/w fibrotic NSIP? 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 Patient is high risk for decompensation with chronic respiratory failure and chronic hypersensitivity pneumonitis.  Plan  . Patient Instructions  Omnicef 365m Twice daily  For 7 days - take with food.  Mucinex DM Twice daily  As needed  Cough /congestion .     Use hypertonic saline twice a day to help clear mucus out of your lungs Use albuterol twice a day or as needed for shortness of breath Flutter valve Twice daily     Continue  on Nintedanib 150 mg twice a day.   Chronic respiratory failure with hypoxemia: Keep using 3-4 L of oxygen continuously Activity as tolerated.   Follow 6-8 weeks with Dr. RChase Callerand As needed   Please contact office for sooner follow up if symptoms do not improve or worsen or seek emergency care        Follow Up Instructions: Follow-up in 6 to 8 weeks and as needed   I discussed the assessment and treatment plan with the patient. The patient was provided an opportunity to ask questions and all were answered. The patient agreed with the plan and demonstrated an understanding of the instructions.   The patient was advised to call back or seek an in-person evaluation if the symptoms worsen or if the condition fails to improve as anticipated.  I provided 22  minutes of non-face-to-face time during this encounter.   TRexene Edison NP

## 2020-08-04 NOTE — Patient Instructions (Signed)
Omnicef 339m Twice daily  For 7 days - take with food.  Mucinex DM Twice daily  As needed  Cough /congestion .     Use hypertonic saline twice a day to help clear mucus out of your lungs Use albuterol twice a day or as needed for shortness of breath Flutter valve Twice daily     Continue on Nintedanib 150 mg twice a day.   Chronic respiratory failure with hypoxemia: Keep using 3-4 L of oxygen continuously Activity as tolerated.   Follow 6-8 weeks with Dr. RChase Callerand As needed   Please contact office for sooner follow up if symptoms do not improve or worsen or seek emergency care

## 2020-08-18 ENCOUNTER — Telehealth: Payer: Self-pay | Admitting: Adult Health

## 2020-08-18 NOTE — Telephone Encounter (Signed)
Routing to TP who is in office today.   [name]

## 2020-08-18 NOTE — Telephone Encounter (Signed)
Called and spoke with patient who states that she saw TP 3/1, patient was put on Omnicef, patient doesn't feel like she's getting any better. Pt still has a lot if chest congestion, got better, but is still hanging on. Slight cough-yellow phlegm, not as much as she was but it still coughing some up. Feels like breathing is not as good as it should be.   Brain please advise as Tammy is not available

## 2020-08-18 NOTE — Telephone Encounter (Signed)
appt scheduled for 08/20/2020 at 11:30 with Beth. Patient is aware and voiced her understanding.  Nothing further needed at this time.

## 2020-08-18 NOTE — Telephone Encounter (Signed)
Will need ov for further evaluation if not improving on abx, last ov was telemedicine visit   Needs in person   Please contact office for sooner follow up if symptoms do not improve or worsen or seek emergency care

## 2020-08-20 ENCOUNTER — Other Ambulatory Visit: Payer: Self-pay

## 2020-08-20 ENCOUNTER — Ambulatory Visit (INDEPENDENT_AMBULATORY_CARE_PROVIDER_SITE_OTHER): Payer: Medicare Other | Admitting: Primary Care

## 2020-08-20 ENCOUNTER — Encounter: Payer: Self-pay | Admitting: Primary Care

## 2020-08-20 ENCOUNTER — Ambulatory Visit (INDEPENDENT_AMBULATORY_CARE_PROVIDER_SITE_OTHER): Payer: Medicare Other

## 2020-08-20 VITALS — BP 152/72 | HR 103 | Temp 97.5°F | Ht 66.0 in | Wt 213.8 lb

## 2020-08-20 DIAGNOSIS — J471 Bronchiectasis with (acute) exacerbation: Secondary | ICD-10-CM

## 2020-08-20 DIAGNOSIS — J209 Acute bronchitis, unspecified: Secondary | ICD-10-CM | POA: Diagnosis not present

## 2020-08-20 DIAGNOSIS — J479 Bronchiectasis, uncomplicated: Secondary | ICD-10-CM | POA: Diagnosis not present

## 2020-08-20 MED ORDER — PREDNISONE 10 MG PO TABS: ORAL_TABLET | ORAL | 0 refills | Status: DC

## 2020-08-20 NOTE — Progress Notes (Deleted)
Please let patient know her CXR is stable. Chronic fibrosis/ bronchitis changes. No new opacities/pneumonia. The abx that Dr. Chase Caller prescribed called Keflex is the same drug class as the medication omnicef that Tammy prescribed. I would recommended either Augmentin 1 tab twice daily x 10 days. Please send RX in if she is ok with that.

## 2020-08-20 NOTE — Progress Notes (Signed)
Please let patient know her CXR is stable. Chronic fibrosis/ bronchitis changes. No new opacities/pneumonia.   The abx that Dr. Chase Caller prescribed called Keflex is the same drug class as the medication omnicef that Tammy prescribed. I would recommended  Augmentin 1 tab twice daily x 10 days. Please send RX in if she is ok with that.

## 2020-08-20 NOTE — Patient Instructions (Addendum)
Recommendations: - Continue hypertonic saline nebulizer twice day (use flutter valve after nebulizer) - Please take 667m mucinex twice a day with full glass of water (10 days) - Use flutter valve 2-3 times a day - RX prednisone 23mx 5 days; 1018m 5 days then stop (RX sent to [facility]) - Chest xray today  Follow-up: - Please call if not better in 5-7 days     Bronchiectasis  Bronchiectasis is a condition in which the airways in the lungs (bronchi) are damaged and widened. The condition makes it hard for the lungs to get rid of mucus, and it causes mucus to gather in the bronchi. This condition often leads to lung infections, which can make the condition worse. What are the causes? You can be born with this condition or you can develop it later in life. Common causes of this condition include:  Cystic fibrosis.  Repeated lung infections, such as pneumonia or tuberculosis.  An object or other blockage in the lungs.  Breathing in fluid, food, or other objects (aspiration).  A problem with the immune system and lung structure that is present at birth (congenital). Sometimes the cause is not known. What are the signs or symptoms? Common symptoms of this condition include:  A daily cough that brings up mucus and lasts for more than 3 weeks.  Lung infections that happen often.  Shortness of breath and wheezing.  Weakness and fatigue. How is this diagnosed? This condition is diagnosed with tests, such as:  Chest X-rays or CT scans. These are done to check for changes in the lungs.  Breathing tests. These are done to check how well your lungs are working.  A test of a sample of your saliva (sputum culture). This test is done to check for infection.  Blood tests and other tests. These are done to check for related diseases or causes. How is this treated? Treatment for this condition depends on the severity of the illness and its cause. Treatment may include:  Medicines  that loosen mucus so it can be coughed up (mucolytics).  Medicines that relax the muscles of the bronchi (bronchodilators).  Antibiotic medicines to prevent or treat infection.  Physical therapy to help clear mucus from the lungs. Techniques may include: ? Postural drainage. This is when you sit or lie in certain positions so that mucus can drain by gravity. ? Chest percussion. This involves tapping the chest or back with a cupped hand. ? Chest vibration. For this therapy, a hand or special equipment vibrates your chest and back.  Surgery to remove the affected part of the lung. This may be done in severe cases. Follow these instructions at home: Medicines  Take over-the-counter and prescription medicines only as told by your health care provider.  If you were prescribed an antibiotic medicine, take it as told by your health care provider. Do not stop taking the antibiotic even if you start to feel better.  Avoid taking sedatives and antihistamines unless your health care provider tells you to take them. These medicines tend to thicken the mucus in the lungs. Managing symptoms  Perform breathing exercises or techniques to clear your lungs as told by your health care provider.  Consider using a cold steam vaporizer or humidifier in your room or home to help loosen secretions.  If you have a cough that gets worse at night, try sleeping in a semi-upright position. General instructions  Get plenty of rest.  Drink enough fluid to keep your urine clear  or pale yellow.  Stay inside when pollution and ozone levels are high.  Stay up to date with vaccinations and immunizations.  Avoid cigarette smoke and other lung irritants.  Do not use any products that contain nicotine or tobacco, such as cigarettes and e-cigarettes. If you need help quitting, ask your health care provider.  Keep all follow-up visits as told by your health care provider. This is important. Contact a health care  provider if:  You cough up more sputum than before and the sputum is yellow or green in color.  You have a fever.  You cannot control your cough and are losing sleep. Get help right away if:  You cough up blood.  You have chest pain.  You have increasing shortness of breath.  You have pain that gets worse or is not controlled with medicines.  You have a fever and your symptoms suddenly get worse. Summary  Bronchiectasis is a condition in which the airways in the lungs (bronchi) are damaged and widened. The condition makes it hard for the lungs to get rid of mucus, and it causes mucus to gather in the bronchi.  Treatment usually includes therapy to help clear mucus from the lungs.  Avoid cigarette smoke and other lung irritants.  Stay up to date with vaccinations and immunizations. This information is not intended to replace advice given to you by your health care provider. Make sure you discuss any questions you have with your health care provider. Document Revised: 01/23/2020 Document Reviewed: 01/23/2020 Elsevier Patient Education  2021 [employer].

## 2020-08-20 NOTE — Progress Notes (Signed)
_0  ID: [name], female    DOB: [birth year], 80 y.o.   MRN: [id number]  Chief Complaint  Patient presents with  . Follow-up    On ABX x 1 week no better    Referring provider: Lawerance Cruel, MD  HPI: 80 year old female, never smoked.  Past medical history significant for bronchiectasis, hypersensitivity pneumonitis, interstitial fibrosis, acute on chronic respiratory failure, pulmonary hypertension, diastolic heart failure, rheumatoid arthritis.  Patient of Dr. Chase Caller, last seen by pulmonary nurse practitioner on 08/04/2020 for acute televisit due to cough.  08/20/2020 Patient presents today for 2-week follow-up.  Patient reports no improvement after completing Cefdinir course. Her cough initially improved some but never completely resolved. Her symptoms returned 3-4 days ago. She has been prescribed Cephalexin 551m in the past which clear her symptoms well. She is not currently taking anything over the counter for cough. She is using hypertonic saline nebulizer 1-2 times a day. She has a flutter device but has not been using. O2 was 83% on 4 L with ambulation today, recovered quickly.   Allergies  Allergen Reactions  . Adhesive [Tape] Itching and Rash  . Latex Itching and Swelling  . Doxycycline Rash  . Sulfonamide Derivatives Other (See Comments)    Unknown    Immunization History  Administered Date(s) Administered  . Fluad Quad(high Dose 65+) 02/07/2019  . Influenza, High Dose Seasonal PF 04/20/2016, 02/23/2017, 02/12/2018  . Influenza-Unspecified 03/06/2014, 02/18/2015  . PFIZER(Purple Top)SARS-COV-2 Vaccination 07/12/2019, 08/06/2019, 04/12/2020  . Pneumococcal Conjugate-13 03/06/2014  . Tdap 06/14/2017    Past Medical History:  Diagnosis Date  . Allergic rhinitis    takes Claritin nightly  . Anxiety    but not on any meds  . Arthritis   . Asthma   . Carpal tunnel syndrome   . CHF (congestive heart failure) ([place]    patient denies-in office  note 05/2015 by TAleene Davidson . GERD (gastroesophageal reflux disease)   . History of bronchitis 3-412yrago  . History of colon polyps    benign  . Joint pain   . Peripheral edema    takes Lasix daily  . [place]   chronic hypersensitivity pneumonitis- Dr. McSpero Curb. Pulmonary hypertension ([facility]   patient denies-in office note from Dr. McSpero Curb . Shortness of breath dyspnea    with exertion;Albuterol inhaler as needed  . Thyroid nodule     Tobacco History: Social History   Tobacco Use  Smoking Status Never Smoker  Smokeless Tobacco Never Used   Counseling given: Not Answered   Outpatient Medications Prior to Visit  Medication Sig Dispense Refill  . aspirin 325 MG tablet Take 650 mg by mouth daily as needed for moderate pain or headache.    . cefdinir (OMNICEF) 300 MG capsule Take 1 capsule (300 mg total) by mouth 2 (two) times daily. 14 capsule 0  . Cholecalciferol (DIALYVITE VITAMIN D 5000) 125 MCG (5000 UT) capsule Take 5,000 Units by mouth daily.    . fluticasone (FLONASE) 50 MCG/ACT nasal spray Place 1 spray into both nostrils daily as needed for allergies.     . Marland Kitchenbuprofen (ADVIL) 200 MG tablet Take 200-400 mg by mouth every 6 (six) hours as needed for headache or moderate pain.    . Marland Kitchenoratadine (CLARITIN) 5 MG chewable tablet Chew 5-10 mg by mouth daily as needed for allergies.     . Multiple Vitamin (MULTIVITAMIN WITH MINERALS) TABS tablet Take 1 tablet by mouth daily.    .Marland Kitchen  OFEV 150 MG CAPS TAKE 1 CAPSULE (150 MG TOTAL) BY MOUTH TWO TIMES DAILY. 60 capsule 3  . Respiratory Therapy Supplies (FLUTTER) DEVI Use as directed. 1 each 0  . sodium chloride HYPERTONIC 3 % nebulizer solution Take 4 mLs by nebulization daily. 480 mL 0  . sodium chloride HYPERTONIC 3 % nebulizer solution Take 4 mLs by nebulization daily. 480 mL 0  . vitamin B-12 (CYANOCOBALAMIN) 1000 MCG tablet Take 1,000 mcg by mouth every other day.    . albuterol  (VENTOLIN HFA) 108 (90 Base) MCG/ACT inhaler Inhale 2 puffs into the lungs every 6 (six) hours as needed for wheezing or shortness of breath. 18 g 6   No facility-administered medications prior to visit.   Review of Systems  Review of Systems  Constitutional: Negative.   HENT: Positive for postnasal drip.   Respiratory: Positive for cough, chest tightness and wheezing.   Cardiovascular: Negative.    Physical Exam  BP (!) 152/72 (BP Location: Right Arm, Patient Position: Sitting, Cuff Size: Normal)   Pulse (!) 103   Temp (!) 97.5 F (36.4 C) (Skin)   Ht _0  (1.676 m)   Wt 213 lb 12.8 oz (97 kg)   SpO2 95% Comment: 4 L [place] pulsed  BMI 34.51 kg/m  Physical Exam Constitutional:      Appearance: Normal appearance.  HENT:     Head: Normocephalic and atraumatic.     Mouth/Throat:     Mouth: Mucous membranes are moist.     Pharynx: Oropharynx is clear.  Cardiovascular:     Rate and Rhythm: Normal rate and regular rhythm.  Pulmonary:     Effort: Pulmonary effort is normal.     Breath sounds: Wheezing and rales present.  Musculoskeletal:        General: Normal range of motion.  Skin:    General: Skin is warm and dry.  Neurological:     General: No focal deficit present.     Mental Status: She is alert and oriented to person, place, and time. Mental status is at baseline.  Psychiatric:        Mood and Affect: Mood normal.        Behavior: Behavior normal.        Thought Content: Thought content normal.        Judgment: Judgment normal.      Lab Results:  CBC    Component Value Date/Time   WBC 11.4 (H) 05/31/2018 1531   RBC 4.75 05/31/2018 1531   HGB 13.5 05/31/2018 1531   HCT 44.5 05/31/2018 1531   PLT 348 05/31/2018 1531   MCV 93.7 05/31/2018 1531   MCH 28.4 05/31/2018 1531   MCHC 30.3 05/31/2018 1531   RDW 13.8 05/31/2018 1531   LYMPHSABS 3.5 04/19/2017 1617   MONOABS 0.9 04/19/2017 1617   EOSABS 0.7 04/19/2017 1617   BASOSABS 0.1 04/19/2017 1617     BMET    Component Value Date/Time   NA 141 05/31/2018 1531   K 3.8 05/31/2018 1531   CL 102 05/31/2018 1531   CO2 30 05/31/2018 1531   GLUCOSE 89 05/31/2018 1531   BUN 12 05/31/2018 1531   CREATININE 0.52 05/31/2018 1531   CALCIUM 9.1 05/31/2018 1531   GFRNONAA >60 05/31/2018 1531   GFRAA >60 05/31/2018 1531    BNP    Component Value Date/Time   BNP 157.3 (H) 06/15/2016 1336    ProBNP    Component Value Date/Time   PROBNP 22.0 04/19/2017 1617  Imaging: DG Chest 2 View  Result Date: 08/20/2020 CLINICAL DATA:  Bronchiectasis with interstitial lung disease EXAM: CHEST - 2 VIEW COMPARISON:  November 13, 2018 chest radiograph; chest CT Oct 24, 2019 FINDINGS: Interstitial fibrosis is again noted throughout the lungs bilaterally, stable. There is lower lobe bronchiectatic change, stable. No new opacity evident. Heart size and pulmonary vascular normal. There is aortic atherosclerosis. 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 IMPRESSION: Extensive fibrotic changes stable. Lower lobe bronchiectatic change appear stable. No new opacity. Stable cardiac silhouette. Aortic Atherosclerosis (ICD10-I70.0). Electronically Signed   By: Lowella Grip III M.D.   On: 08/20/2020 12:41     Assessment & Plan:   Bronchiectasis with (acute) exacerbation (HCC) - Prolonged exacerbation x 3 weeks. No significant improvement after completing oral Cefdinir course. Cough returned 3-4 days. On exam patient had scattered wheezing and rales. O2 was 95% on 4l - CXR showed chronic fibrosis/ bronchitis changes. No new opacities/pneumonia.  - Patient appears to response better to Keflex than Cefdinir. Both are cephalosporins, would recommend trial Augmentin 1 tab twice daily x 10 days and RX prednisone 45m x 5 days; 152mx 5 days  - Recommend patient continue hypertonic saline nebulizer twice day, take 60052mucinex twice a day with full glass of water and use flutter valve 2-3 times a  day  Follow-up: - Please call if not better in 5-7 days      EliMartyn EhrichP 08/24/2020

## 2020-08-21 ENCOUNTER — Telehealth: Payer: Self-pay | Admitting: Primary Care

## 2020-08-21 MED ORDER — ALBUTEROL SULFATE HFA 108 (90 BASE) MCG/ACT IN AERS: 2.0000 | INHALATION_SPRAY | Freq: Four times a day (QID) | RESPIRATORY_TRACT | 6 refills | Status: DC | PRN

## 2020-08-21 MED ORDER — AMOXICILLIN-POT CLAVULANATE 875-125 MG PO TABS
1.0000 | ORAL_TABLET | Freq: Two times a day (BID) | ORAL | 0 refills | Status: DC
Start: 2020-08-21 — End: 2020-09-24

## 2020-08-21 NOTE — Telephone Encounter (Signed)
Please let patient know her CXR is stable. Chronic fibrosis/ bronchitis changes. No new opacities/pneumonia.   The abx that Dr. Chase Caller prescribed called Keflex is the same drug class as the medication omnicef that Tammy prescribed. I would recommended Augmentin 1 tab twice daily x 10 days. Please send RX in if she is ok with that.    I have called and spoke with pt and she is aware of results and medication that has been sent to the pharmacy.  Pt advised to call back for any questions or concerns.

## 2020-08-24 NOTE — Assessment & Plan Note (Signed)
-  Prolonged exacerbation x 3 weeks. No significant improvement after completing oral Cefdinir course. Cough returned 3-4 days. On exam patient had scattered wheezing and rales. O2 was 95% on 4l - CXR showed chronic fibrosis/ bronchitis changes. No new opacities/pneumonia.  - Patient appears to response better to Keflex than Cefdinir. Both are cephalosporins, would recommend trial Augmentin 1 tab twice daily x 10 days and RX prednisone 59m x 5 days; 123mx 5 days  - Recommend patient continue hypertonic saline nebulizer twice day, take 6004mucinex twice a day with full glass of water and use flutter valve 2-3 times a day  Follow-up: - Please call if not better in 5-7 days

## 2020-08-26 ENCOUNTER — Other Ambulatory Visit: Payer: Self-pay | Admitting: *Deleted

## 2020-08-26 NOTE — Progress Notes (Signed)
Encounter opened in error.

## 2020-09-21 ENCOUNTER — Other Ambulatory Visit (HOSPITAL_COMMUNITY)
Admission: RE | Admit: 2020-09-21 | Discharge: 2020-09-21 | Disposition: A | Discharge status: Home / Self Care | Payer: Medicare Other | Source: Ambulatory Visit | Attending: Internal Medicine | Admitting: Internal Medicine

## 2020-09-21 DIAGNOSIS — Z01812 Encounter for preprocedural laboratory examination: Secondary | ICD-10-CM | POA: Insufficient documentation

## 2020-09-21 DIAGNOSIS — Z20822 Contact with and (suspected) exposure to covid-19: Secondary | ICD-10-CM | POA: Insufficient documentation

## 2020-09-22 LAB — SARS CORONAVIRUS 2 (TAT 6-24 HRS): SARS Coronavirus 2: NEGATIVE

## 2020-09-24 ENCOUNTER — Ambulatory Visit (INDEPENDENT_AMBULATORY_CARE_PROVIDER_SITE_OTHER): Payer: Medicare Other | Admitting: Internal Medicine

## 2020-09-24 ENCOUNTER — Other Ambulatory Visit: Payer: Self-pay

## 2020-09-24 ENCOUNTER — Encounter: Payer: Self-pay | Admitting: Internal Medicine

## 2020-09-24 ENCOUNTER — Telehealth: Payer: Self-pay | Admitting: Internal Medicine

## 2020-09-24 VITALS — BP 128/74 | HR 79 | Temp 97.8°F | Ht 67.0 in | Wt 214.2 lb

## 2020-09-24 DIAGNOSIS — J679 Hypersensitivity pneumonitis due to unspecified organic dust: Secondary | ICD-10-CM

## 2020-09-24 DIAGNOSIS — Z5181 Encounter for therapeutic drug level monitoring: Secondary | ICD-10-CM

## 2020-09-24 DIAGNOSIS — J849 Interstitial pulmonary disease, unspecified: Secondary | ICD-10-CM

## 2020-09-24 DIAGNOSIS — J9611 Chronic respiratory failure with hypoxia: Secondary | ICD-10-CM

## 2020-09-24 LAB — PULMONARY FUNCTION TEST
DL/VA % pred: 67 %
DL/VA: 2.68 ml/min/mmHg/L
DLCO cor % pred: 28 %
DLCO cor: 5.95 ml/min/mmHg
DLCO unc % pred: 28 %
DLCO unc: 5.95 ml/min/mmHg
FEF 25-75 Pre: 1.11 L/sec
FEF2575-%Pred-Pre: 69 %
FEV1-%Pred-Pre: 54 %
FEV1-Pre: 1.21 L
FEV1FVC-%Pred-Pre: 108 %
FEV6-%Pred-Pre: 53 %
FEV6-Pre: 1.52 L
FEV6FVC-%Pred-Pre: 104 %
FVC-%Pred-Pre: 50 %
FVC-Pre: 1.52 L
Pre FEV1/FVC ratio: 80 %
Pre FEV6/FVC Ratio: 100 %

## 2020-09-24 NOTE — Patient Instructions (Addendum)
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 Highly concerned moe progressive in April 2022 compared to 2021 after respiratory infeciton in [facility] 2022  - Understood/noted baseline reluctance towards prednisone  Plan   -Continue nintedanib as before  - Check LFT 09/24/2020 -Re-refer cardiology (Dr Loralie Champagne or Dr Pierre Bali or Dr Adrian Prows) for right heart cath asap --Continue involvement with the support group through [place] consent for ILD-pro registry study   - Research staff will prescreen you and will contact you if you are eligible = Do HRCT supine/prone next few days to few weeks    Follow-up --next few to several weeks but after completing CT and heart cath  - basedon results - discussion on daily prednisone, versus teprostinil, versus PULSE study

## 2020-09-24 NOTE — Telephone Encounter (Signed)
Dalton/Dan  She cancelled RHC earlier in year due to covid pandemic (she did not have covid). HEr PFT show decline . USes 3-4L N with exertion  Plan  - would you be kind enough to get RHC done next few to several weeks   Thanks  MR

## 2020-09-24 NOTE — Progress Notes (Signed)
OV 07/29/2019 -transfer of care to Dr. Chase Caller interstitial lung disease center.  Subjective:  Patient ID: [name], female , DOB: Dec 26, 1940 , age 80 y.o. , MRN: [id number] , ADDRESS: [place] [id number]   07/29/2019 -   Chief Complaint  Patient presents with  . Follow-up    Pt states her breathing has become worse since last visit. States she also has an occ cough.   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 Interestingly, there are scattered non-necrotizing granulomata in the pleura.  -Second opinion September 2016 at [facility]: pathology was reviewed at our multidisciplinary ILD conference and noted to have a variegated pattern of fibrosis with some airway centricity, granulomas noted both near the airway and more peripherally. Minimal inflammation was noted. Features not c/w with UIP. Few giant cells with cholesterol clefts. Overall pattern most in keeping with chronic HP  -High-resolution CT chest including April 09, 2018: Lack of craniocaudal gradient and significant air trapping suggestive of chronic hypersensitivity pneumonitis [September 2014-07-30 [facility] evaluation thought CT was inconsistent with UIP]  -Clinical diagnosis of chronic hypersensitivity pneumonitis   - dx given Sept 2016 at [place]  -No clear-cut exposure identified at [facility] evaluation September 2016 by Dr. Dorothyann Peng  -Steroids recommended but patient preferred not to take it September 2016 at [place]   - deferred steroids sept July 30, 2014 visit at [facility] with Dr Sherri Sear  - nintedanib since March 2020   #Small hiatal hernia with acid reflux disease  HPI [name] 80 y.o. -is a transfer of care from Dr.  Lake Bells to Dr. Chase Caller with interstitial lung disease questionnaire.  She was last seen by Dr. Lake Bells in March 2020.  After the onset of the COVID-19 pandemic Dr. Lake Bells is now fully in charge of the Covid hospital [facility].  Therefore he is not in the outpatient medical practice anymore.  Therefore patient has been transferred to the ILD center to Dr. Chase Caller myself.  Is the first time I am meeting her and getting to know her.  She tells me that I took care of her husband for COPD a few years ago.  This was her ex-husband.  He passed away in 07-31-2011 after hospitalization to the ICU under our service.  She is grateful for the care.  She tells me that she has interstitial lung disease and for the last year she has been on nintedanib.  Currently she is having difficulty with nintedanib supply.  She only has 2 weeks left.  She uses 3 L of oxygen at rest.  She says she has been doing that for the last 4 years.  It is stable usage.  I am not really sure what her pulse ox on room air would be.  She says occasionally she would crank it up to 4 L for exertion but she generally uses 3 L both at rest and with exertion.  She says she tolerates nintedanib fine. Her interstitial lung disease history was reviewed and summarized above.  It appears that she was not keen on taking prednisone and her July 30, 2014 visit with Dr. Dorothyann Peng at [facility].  Therefore once the [facility] data was available she was started on nintedanib a year ago.She wants  to have a son involved in her visits.  Because of the COVID-19 pandemic he is waiting outside in the car.  She says at the next visit she wants to bring him in.   Her symptom score is listed below.  Her progressive lung function decline is also listed below and the pulmonary function test reviewed.  I personally reviewed and visualized and interpreted those.  Her DLCO is paradoxically higher.  I think this is an error.   ROS - per HPI  OV  10/03/2019  Subjective:  Patient ID: [name], female , DOB: 12-25-1940 , age 80 y.o. 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 Interestingly, there are scattered non-necrotizing granulomata in the pleura.  -Second opinion September 2016 at [facility]: pathology was reviewed at our multidisciplinary ILD conference and noted to have a variegated pattern of fibrosis with some airway centricity, granulomas noted both near the airway and more peripherally. Minimal inflammation was noted. Features not c/w with UIP. Few giant cells with cholesterol clefts. Overall pattern most in keeping with chronic HP  -High-resolution CT chest including April 09, 2018: Lack of craniocaudal gradient and significant air trapping suggestive of chronic hypersensitivity pneumonitis [September 2016 [facility] evaluation thought CT was inconsistent with UIP]  -Clinical diagnosis of chronic hypersensitivity pneumonitis   - dx given Sept 2016 at [place]  -No clear-cut exposure identified at [facility] evaluation September 2016 by Dr. Dorothyann Peng  -Steroids recommended but patient preferred not to take it September 2016 at [place]   - deferred steroids sept 2016 visit at [facility] with Dr Sherri Sear  - nintedanib since March 2020   #Small hiatal hernia with acid reflux disease  10/03/2019 -   Chief Complaint  Patient presents with  . Follow-up    SOB and cough with activity has increasd, Productive cough with yellow sputum     HPI [name] 80 y.o. -returns for follow-up with her son.  At this point in time the focus is to establish the true nature of interstitial lung  disease.  She has brought her son with her.  However approximately 2 weeks ago she had acute bronchitis symptoms and called in.  Was given a cephalexin 5 days and prednisone 5 days.  These have helped immensely.  However she feels not back to baseline.  She feels she will benefit from another round of antibiotic with or without prednisone.  She finished ILD questionnaire and it is listed below.  We have not been able to discuss in a multidisciplinary case conference of 4. [facility] Integrated Comprehensive ILD Questionnaire  Symptoms: See below.    SYMPTOM SCALE - ILD 07/29/2019  10/03/2019   O2 use 3L at rest, occ 4L with exertion 3L at rest and 4L with exertion. Acute bronchitis 2 weeks ago  Shortness of Breath 0 -> 5 scale with 5 being worst (score 6 If unable to do)   At rest 0 1  Simple tasks - showers, clothes change, eating, shaving 1 2  Household (dishes, doing bed, laundry) 2 3  Shopping 2 2  Walking level at own pace 1 2  Walking up Stairs 6 4  Total (30-36) Dyspnea Score  12 14  How bad is your cough? 1 3.5  How bad is your fatigue 0 3.5  How bad is nausea 0 0  How bad is vomiting?  0 0  How bad is diarrhea? 0 0  How bad is anxiety? 1 1.5  How bad is depression 1 1     Past Medical History :  -Denies any collagen vascular disease or vasculitis.  Denies any knowledge of prior pulmonary hypertension.  Denies diabetes or thyroid disease.  Denies any stroke.  Denies mononucleosis denies tuberculosis denies pneumonia denies blood clots denies heart disease denies pleurisy   ROS: Positive for shoulder pain for the last several years but otherwise no dry eyes.  No Raynaud's.  No weight loss.  No nausea no vomiting no rash   FAMILY HISTORY of LUNG DISEASE: Denies any pulmonary fibrosis or COPD or cystic fibrosis of hypersensitive pneumonitis or any other lung disease.   EXPOSURE HISTORY: Never smoked cigarettes.  No passive smoking.  No marijuana.  No vaping no cocaine no  intravenous drug use.   HOME and HOBBY DETAILS : Current home is 25 years she is lived there for 5 years.  In [place] there is no dampness.  No mold or mildew no feather pillow.  No steam iron use no Jacuzzi use no nebulizer use no humidifier use.  No pet birds or parakeets.  No pet gerbils no pet hamsters no pet rabbits no rodents.  No mold in the [facility] duct no music habits such as wind instruments no gardening.  No flood of water damage.  No strong mats.  No exposure to animals at work.   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  She also had a long-term tendency to clean her home heavily with Clorox.  She inhaled these fumes according to the son.  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   PULMONARY TOXICITY HISTORY (27 items): She has done prednisone burst.  She does not want to do long-term prednisone.  She has been on long-term nintedanib but tolerating it well.    Testing -2018 echo with elevated pulmonary systolic pressure 55 mmHg  - nov 2019 Last CT - HRCT IMPRESSION: 1. Spectrum of findings compatible with fibrotic interstitial lung disease with extensive air trapping and absence of basilar gradient, strongly favoring chronic hypersensitivity pneumonitis. Findings have slightly progressed since 04/24/2017, with more clear progression since more remote chest CT of 06/24/2014. Findings are suggestive of an alternative diagnosis (not UIP) per consensus guidelines: Diagnosis of Idiopathic Pulmonary Fibrosis: An Official ATS/ERS/JRS/ALAT Clinical Practice Guideline. [place], Iss 5, [phone], Feb 04 2017. 2. Stable mild mediastinal lymphadenopathy, most compatible with benign reactive adenopathy. 3. One vessel coronary atherosclerosis. 4.  Small hiatal hernia.  Aortic Atherosclerosis (ICD10-I70.0).     Results for LIDWINA, KANER (MRN [id number]) as of 07/29/2019 11:43  Ref. Range 07/04/2014 12:58 09/03/2015 11:04 09/27/2016 13:56 05/01/2017 14:05 07/19/2019 15:54  FVC-Pre Latest Units: L 2.13 1.93 1.86 1.71 1.61  FVC-%Pred-Pre Latest Units: % 66 61 59 55 53   Results for SUDA, FORBESS (MRN [id number]) as of 07/29/2019 11:43  Ref. Range 07/04/2014 12:58 09/03/2015 11:04 09/27/2016 13:56 05/01/2017 14:05 07/19/2019 15:54  DLCO unc Latest Units: ml/min/mmHg  13.70 14.70 12.69 11.23 17.11  DLCO unc % pred Latest Units: % 48 51 44 39 82     ROS  OV 12/03/2019  Subjective:  Patient ID: [name], female , DOB: [birth year] , age 83 y.o. , MRN: [id number] , ADDRESS: [place] [address]   12/03/2019 -   Chief Complaint  Patient presents with  . 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 Interestingly, there are scattered non-necrotizing granulomata in the pleura.  -Second opinion September 2016 at [facility]: pathology was reviewed at our multidisciplinary ILD conference and noted to have a variegated pattern of fibrosis with some airway centricity, granulomas noted both near the airway and more peripherally. Minimal inflammation was noted. Features not c/w with UIP. Few giant cells with cholesterol clefts. Overall pattern most in keeping with chronic HP   - 3rd opinon: June 2021 [facility] ILD [place] - HP   -High-resolution CT chest including April 09, 2018: Lack of craniocaudal gradient and significant air trapping suggestive of  chronic hypersensitivity pneumonitis [September 2016 [facility] evaluation thought CT was inconsistent with UIP]  -Clinical diagnosis of chronic hypersensitivity pneumonitis   - dx given Sept 2016 at [place]  -No clear-cut exposure identified at [facility] evaluation September 2016 by Dr. Dorothyann Peng  -Steroids recommended but patient preferred not to take it September 2016 at [place]   - deferred steroids sept 2016 visit at [facility] with Dr Sherri Sear  - nintedanib since March 2020   #Small hiatal hernia with acid reflux disease   HPI [name] 80 y.o. -presents with her son for follow-up.  She continues to be on 3 L at rest 4 L with exertion.  Her acute bronchitis from April 2021 is resolved.  Her overall symptoms are stable/slightly better after her acute bronchitis.  There is no interim complaints.  In the interim we did discuss her at her case conference.  The consensus diagnosis is that she has hypersensitive pneumonitis.  The only exposure is when she was working in the [place] at in [place].  She and her son again denies any ongoing exposures.  Reviewed her pulmonary function test and it is stable compared to the recent ones.  There is progression over the last few years to several years.  In terms of a high-resolution CT chest is also stable since 2019 through 2021 but progressive when compared to earlier time points.  She was started on nintedanib in March 2020.  She is tolerating this quite well.  It appears that she and she went on nintedanib she is stable.  She yet to do pulmonary rehabilitation.  Last done few years ago.  Son feels that she needs to be more mobile She is yet to connect with the support group She did not get referred to cardiology for right heart catheterization. She wants a flutter valve for her associated bronchiectasis seen on CT scan    OV 03/09/2020   Subjective:  Patient ID: [name], female , DOB: [birth year], age 47 y.o. years. , MRN:  [id number],  ADDRESS: [place] [address] PCP  Harrington Challenger,  Dwyane Luo, MD Providers : Treatment Team:  Attending Provider: Brand Males, MD   Chief Complaint  Patient presents with  . Follow-up    Patient wears 4 liters oxygen with exertion, was prescribed celebrex they want to know if it is ok with OFEV.      #Interstitial lung disease (progressive) with chronic hypoxemic respiratory failure - chronic HP dx -Per [facility] September 2016: Disease present at least since 2008  - Surgical lung biopsy  November 20, 2014 at [place]:   - local opinion: diagnostic of usual interstitial pneumonia (UIP). Interestingly, there are scattered non-necrotizing granulomata in the pleura.  -Second opinion September 2016 at [facility]: pathology was reviewed at our multidisciplinary ILD conference and noted to have a variegated pattern of fibrosis with some airway centricity, granulomas noted both near the airway and more peripherally. Minimal inflammation was noted. Features not c/w with UIP. Few giant cells with cholesterol clefts. Overall pattern most in keeping with chronic HP   - 3rd opinon: June 2021 [facility] [place] - Hypersensitivity Pneumonitis   -High-resolution CT chest including April 09, 2018: Lack of craniocaudal gradient and significant air trapping suggestive of chronic hypersensitivity pneumonitis [September 2016 [facility] evaluation thought CT was inconsistent with UIP]  -Clinical diagnosis of chronic hypersensitivity pneumonitis   - dx given Sept 2016 at [place]  -No clear-cut exposure identified at [facility] evaluation September 2016 by Dr. Dorothyann Peng  -Steroids recommended but patient preferred not to take it September 2016 at [place]   - deferred steroids sept 2016 visit at [facility] with Dr Dorothyann Peng and again at [facility] in June 2021  - nintedanib sole Rx since March 2020     HPI [name] 80 y.o. -returns for follow-up with  [name].  Since her last visit in June 2020 when she continues to be stable.  She is enrolled in the pulmonary rehabilitation program which has helped her fatigue and shortness of breath significantly.  She continues on nintedanib without any adverse side effects.  She wanted to know if it is okay to take cephalexin for arthralgia.  She has chronic osteoarthritis.  She wants to avoid NSAIDs.  In terms of oxygen use she is stable at 4 L nasal cannula.  She recently had pulmonary function test I reviewed this and it shows stability and is documented below.  Her weight continues to be stable.  We referred her for right heart catheterization with because of the Covid delta surge she deferred it.  Of note on February 28, 2020 while trying to get into the car out of her primary care physician's office she fell down and sustained abrasions and bruises injuries particularly in her lower extremity.  Since then her right kneecap patella area is red and warm.  There is a small incision injury there but this is healed.  She thinks it slightly better/stable.  She is not having any fever.  This has not been treated.    SYMPTOM SCALE - ILD 07/29/2019  10/03/2019  12/03/2019  03/09/2020   O2 use 3L at rest, occ 4L with exertion 3L at rest and 4L with exertion. Acute bronchitis 2 weeks ago 3L rest, 4 L exertion 3-4L [place] of[place]. Doing rehab  Shortness of Breath 0 -> 5 scale with 5 being worst (score 6 If unable to do)     At rest 0 1 0 0  Simple tasks - showers, clothes change, eating, shaving _0 2  Household (dishes, doing bed, laundry) _0 Shopping _1 Walking level at own pace _2 Walking up [employer] _3 Total (30-36) Dyspnea Score _4 How bad is your cough? 1 3.5  More often lateley  How bad is your fatigue 0 3.5 2 Better with exercise  How bad is nausea 0 0 0 0  How bad is vomiting?  0 0 0 0  How bad is diarrhea? 0 0 0 0  How bad is anxiety? 1 1._5 How bad is  depression _6 PFT Results Latest Ref Rng & Units 03/05/2020 12/03/2019 07/19/2019 05/01/2017 09/27/2016 09/03/2015 07/04/2014  FVC-Pre L 1.70 1.66 1.61 1.71 1.86 1.93 2.13  FVC-Predicted Pre % 57 55 53 55 59 61 66  FVC-Post L - - 1.87 1.72 1.91 2.03 2.26  FVC-Predicted Post % - - 61 55 61 64 70  Pre FEV1/FVC % % 81 82 81 87 86 87 89  Post FEV1/FCV % % - - 78 90 89 88 88  FEV1-Pre L 1.39 1.36 1.31 1.50 1.60 1.68 1.90  FEV1-Predicted Pre % 62 60 57 64 68 70 78  FEV1-Post L - - 1.46 1.56 1.70 1.78 2.00  DLCO uncorrected ml/min/mmHg 15.25 13.20 17.11 11.23 12.69 14.70 13.70  DLCO UNC% % 73 63 82 39 44 51 48  DLCO corrected ml/min/mmHg 15.25 13.20 - 10.63 12.07 14.09 -  DLCO COR %Predicted % 73 63 - 37 42 49 -  DLVA Predicted % 114 103 120 70 80 78 79  TLC L - - 3.63 3.37 3.49 - 4.00  TLC % Predicted % - - 65 61 63 - 72  RV % Predicted % - - 73 63 64 - 58     ROS - per HPI  CT chest high resolution - may 2021  IMPRESSION: 1. Spectrum of findings compatible with fibrotic interstitial lung disease with moderate air trapping and absence of apicobasilar gradient. Findings are most compatible with chronic hypersensitivity pneumonitis. No interval progression since 2019 chest CT. Evidence of progression since baseline 2016 chest CT. Findings are suggestive of an alternative diagnosis (not UIP) per consensus guidelines: Diagnosis of Idiopathic Pulmonary Fibrosis: An Official ATS/ERS/JRS/ALAT Clinical Practice Guideline. [place], Iss 5, [phone], Feb 04 2017. 2. Stable mild mediastinal lymphadenopathy, compatible with benign reactive adenopathy. 3. Small hiatal hernia. 4. Aortic Atherosclerosis (ICD10-I70.0).   Electronically Signed   By: Ilona Sorrel M.D.   On: 10/24/2019 15:02    OV 09/24/2020  Subjective:  Patient ID: [name], female , DOB: 1940-06-15 , age 65 y.o. , MRN: [id number] , ADDRESS: [place]  [address] PCP Lawerance Cruel, MD Patient Care Team: Lawerance Cruel, MD as PCP - General (Family Medicine) Tamsen Roers, MD as Referring Physician (Family Medicine) Melrose Nakayama, MD as Consulting Physician (Cardiothoracic Surgery)  This Provider for this visit: Treatment Team:  Attending Provider: Brand Males, MD    09/24/2020 -   Chief Complaint  Patient presents with  . Follow-up    Review PFT results. Still reports cough, sometimes productive with clear phlegm.      #Interstitial lung disease (progressive) with chronic hypoxemic respiratory failure - chronic HP dx -Per [facility] September 2016: Disease present at least since 2008  - Surgical lung biopsy  November 20, 2014 at [place]:   - local opinion: diagnostic of usual interstitial pneumonia (UIP). Interestingly, there are scattered non-necrotizing granulomata in the pleura.  -Second opinion September 2016 at [facility]: pathology was reviewed at our multidisciplinary ILD conference and noted to have a variegated pattern of fibrosis with some airway centricity, granulomas noted both near the airway and more peripherally. Minimal inflammation was noted. Features not c/w with UIP. Few giant cells with cholesterol clefts. Overall pattern most in keeping with chronic HP   - 3rd opinon: June 2021 [facility] IL[facility] [place] - Hypersensitivity Pneumonitis   -High-resolution CT chest including April 09, 2018: Lack of craniocaudal gradient and significant air trapping suggestive of chronic hypersensitivity pneumonitis [September 2016 [facility] evaluation thought CT was inconsistent with UIP]  - May 2021: stability since 2019 but progression sice 2016  -Clinical diagnosis of chronic hypersensitivity pneumonitis   - dx given Sept 2016 at [place]  -No clear-cut exposure identified at [facility] evaluation September 2016 by Dr. Dorothyann Peng  -Steroids recommended but patient preferred not to take it September 2016 at [place]   - deferred steroids sept 2016 visit at [facility] with Dr Dorothyann Peng and again at [facility] in June 2021, Apr 2022  - nintedanib sole Rx since March 2020       HPI [name] 80 y.o. -presents for follow-up.  Last seen in the fall 2021.  After that overall she is been stable but last month she picked up a respiratory infection got antibiotic and prednisone and then she is better.  Nevertheless she thinks her symptoms may be a little worse than baseline.  Still she says she gets by with 3-4 L of oxygen which is her baseline.  Subjective symptom score overall has not changed.  She had pulmonary function test today and this shows a decline it is all documented below.  I visualized this.  Her last CT scan of the chest was in May 2021.  1 year anniversary of the CT scan is coming up.  Over the winter she declined to have right heart catheterization because of COVID-19.  But at this point in time with the improvement in the pandemic prevalence rate in the local community of COVID she is willing to have a right heart catheterization.  She is tolerating nintedanib just fine without any problems.  A walking desaturation test on room air is as below    SYMPTOM SCALE - ILD 07/29/2019  10/03/2019  12/03/2019  03/09/2020  09/24/2020   O2 use 3L at rest, occ 4L with exertion 3L at rest and 4L with exertion. Acute bronchitis 2 weeks ago 3L rest, 4 L exertion 3-4L [place] On ofev. Doing rehab 3/4L [place] pulse . On ofev  Shortness of Breath 0 -> 5 scale with 5 being worst (score 6 If unable to do)      At rest 0 1 0 0 0  Simple tasks - showers, clothes change, eating, shaving _0 Household (dishes, doing bed, laundry) _1 2.5  Shopping _2 2.5  Walking level at own pace _3 Walking up Stairs _4 Total (30-36) Dyspnea Score _5 How bad is your cough? 1 3.5  More often lateley 2  How bad is your fatigue 0 3.5 2 Better with exercise 2.5  How bad is nausea  0 0 0 0 0  How bad is vomiting?  0 0 0 0 0  How bad is diarrhea? 0 0 0 0 0  How bad is anxiety? 1 1._0 How bad is depression _1 0    Simple office walk 185 feet x  3 laps goal with forehead probe 09/24/2020   O2 used ra (baseline uses 3-4L at home)  Number laps completed End of 1 lap out of intented 3  Comments about pace slow  Resting Pulse Ox/HR 94% and 87/min  Final Pulse Ox/HR 85% and 113/min  Desaturated </= 88% yes  Desaturated <= 3% points yes  Got Tachycardic >/= 90/min yes  Symptoms at end of test Mod dyspnea  Miscellaneous comments desat at end iof 1 lap. STable on room air       PFT  PFT Results Latest Ref Rng & Units 09/24/2020 12/03/2019 12/03/2019 07/19/2019 05/01/2017 09/27/2016 09/03/2015  FVC-Pre L 1.52 1.70 1.66 1.61 1.71 1.86 1.93  FVC-Predicted Pre % 50 57 55 53 55 59 61  FVC-Post L - - - 1.87 1.72 1.91 2.03  FVC-Predicted Post % - - - 61 55 61 64  Pre FEV1/FVC % % 80 81 82 81 87 86 87  Post FEV1/FCV % % - - - 78 90 89 88  FEV1-Pre L 1.21 1.39 1.36 1.31 1.50 1.60 1.68  FEV1-Predicted Pre % 54 62 60 57 64 68 70  FEV1-Post L - - - 1.46 1.56 1.70 1.78  DLCO uncorrected ml/min/mmHg 5.95 15.25 13.20 17.11 11.23 12.69 14.70  DLCO UNC% % 28 73 63 82 39 44 51  DLCO corrected ml/min/mmHg 5.95 15.25 13.20 - 10.63 12.07 14.09  DLCO COR %Predicted % 28 73 63 - 37 42 49  DLVA Predicted % 67 114 103 120 70 80 78  TLC L - - - 3.63 3.37 3.49 -  TLC % Predicted % - - - 65 61 63 -  RV % Predicted % - - - 73 63 64 -       has a past medical history of Allergic rhinitis, Anxiety, Arthritis, Asthma, Carpal tunnel syndrome, CHF (congestive heart failure) ([place]), GERD (gastroesophageal reflux disease), History of bronchitis (3-89yr ago), History of colon polyps, Joint pain, Peripheral edema, Pneumonitis, hypersensitivity ([place] (05/2016), Pulmonary hypertension ([place] (06/30/2017), Shortness of breath dyspnea, and Thyroid nodule.   reports that she has never smoked.  She has never used smokeless tobacco.  Past Surgical History:  Procedure Laterality Date  . BREAST CYST EXCISION Left 50 yrs ago  . CARPELL TUNNELL SURG Right   . COLONOSCOPY    . ESOPHAGOGASTRODUODENOSCOPY    . EYE SURGERY     bilateral cataract surgery with lens implants  . KNEE ARTHROSCOPY W/ MENISCAL REPAIR     left  . LUNG BIOPSY Right 11/20/2014   Procedure: LUNG BIOPSY;  Surgeon: SMelrose Nakayama MD;  [facility]  Service: Thoracic;  Laterality: Right;  . REVERSE SHOULDER ARTHROPLASTY Right 06/07/2018   Procedure: REVERSE SHOULDER ARTHROPLASTY;  Surgeon: SJustice Britain MD;  Location: [facility];  Service: Orthopedics;  Laterality: Right;  1255m  . TOTAL KNEE ARTHROPLASTY Left 08/13/2013   Procedure: LEFT TOTAL KNEE ARTHROPLASTY;  Surgeon: MaMauri PoleMD;  Location: [facility];  Service: Orthopedics;  Laterality: Left;  . UMBILICAL HERNIA REPAIR    . VIDEO ASSISTED THORACOSCOPY Right 11/20/2014   Procedure: RIGHT  VIDEO ASSISTED THORACOSCOPY WITH WEDGE LUNG BIOPSIES RIGHT UPPER,MIDDLE AND LOWER LOBES ,PLACEMENT OF ON Q PAIN  PUMP;  Surgeon: Melrose Nakayama, MD;  Location: [facility];  Service: Thoracic;  Laterality: Right;    Allergies  Allergen Reactions  . Adhesive [Tape] Itching and Rash  . Latex Itching and Swelling  . Doxycycline Rash  . Sulfonamide Derivatives Other (See Comments)    Unknown    Immunization History  Administered Date(s) Administered  . Fluad Quad(high Dose 65+) 02/07/2019  . Influenza, High Dose Seasonal PF 04/20/2016, 02/23/2017, 02/12/2018  . Influenza-Unspecified 03/06/2014, 02/18/2015  . PFIZER(Purple Top)SARS-COV-2 Vaccination 07/12/2019, 08/06/2019, 04/12/2020  . Pneumococcal Conjugate-13 03/06/2014  . Tdap 06/14/2017    Family History  Problem Relation Age of Onset  . Tuberculosis Father   . Tuberculosis Paternal Uncle      Current Outpatient Medications:  .  albuterol (VENTOLIN HFA) 108 (90 Base) MCG/ACT inhaler, Inhale 2 puffs  into the lungs every 6 (six) hours as needed for wheezing or shortness of breath., Disp: 18 g, Rfl: 6 .  fluticasone (FLONASE) 50 MCG/ACT nasal spray, Place 1 spray into both nostrils daily as needed for allergies. , Disp: , Rfl:  .  loratadine (CLARITIN) 5 MG chewable tablet, Chew 5-10 mg by mouth daily as needed for allergies. , Disp: , Rfl:  .  OFEV 150 MG CAPS, TAKE 1 CAPSULE (150 MG TOTAL) BY MOUTH TWO TIMES DAILY., Disp: 60 capsule, Rfl: 3 .  sodium chloride HYPERTONIC 3 % nebulizer solution, Take 4 mLs by nebulization daily., Disp: 480 mL, Rfl: 0 .  sodium chloride HYPERTONIC 3 % nebulizer solution, Take 4 mLs by nebulization daily., Disp: 480 mL, Rfl: 0 .  aspirin 325 MG tablet, Take 650 mg by mouth daily as needed for moderate pain or headache., Disp: , Rfl:  .  Cholecalciferol (DIALYVITE VITAMIN D 5000) 125 MCG (5000 UT) capsule, Take 5,000 Units by mouth daily., Disp: , Rfl:  .  ibuprofen (ADVIL) 200 MG tablet, Take 200-400 mg by mouth every 6 (six) hours as needed for headache or moderate pain., Disp: , Rfl:  .  Multiple Vitamin (MULTIVITAMIN WITH MINERALS) TABS tablet, Take 1 tablet by mouth daily., Disp: , Rfl:  .  Respiratory Therapy Supplies (FLUTTER) DEVI, Use as directed., Disp: 1 each, Rfl: 0 .  vitamin B-12 (CYANOCOBALAMIN) 1000 MCG tablet, Take 1,000 mcg by mouth every other day., Disp: , Rfl:       Objective:   Vitals:   09/24/20 1550  BP: 128/74  Pulse: 79  Temp: 97.8 F (36.6 C)  TempSrc: Temporal  SpO2: 96%  Weight: 214 lb 3.2 oz (97.2 kg)  Height: _0  (1.702 m)    Estimated body mass index is 33.55 kg/m as calculated from the following:   Height as of this encounter: _1  (1.702 m).   Weight as of this encounter: 214 lb 3.2 oz (97.2 kg).  _2 @  Filed Weights   09/24/20 1550  Weight: 214 lb 3.2 oz (97.2 kg)     Physical Exam   General: No distress.  Obese lady Neuro: Alert and Oriented x 3. GCS 15. Speech normal Psych:  Pleasant Resp:  Barrel Chest - no.  Wheeze - no, Crackles - yes, diffuse esp UL, No overt respiratory distress CVS: Normal heart sounds. Murmurs - no Ext: Stigmata of Connective Tissue Disease - no HEENT: Normal upper airway. PEERL +. No post nasal drip        Assessment:       ICD-10-CM   1. 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 Chronic respiratory failure with hypoxia (H[facility])  J96.11   3. Interstitial lung disease ([place])  J84.9   4. 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 Highly concerned moe progressive in April 2022 compared to 2021 after respiratory infeciton in [facility] 2022  - Understood/noted baseline reluctance towards prednisone  Plan   -Continue nintedanib as before  - Check LFT 09/24/2020 -Re-refer cardiology (Dr Loralie Champagne or Dr Pierre Bali or Dr Adrian Prows) for right heart cath asap --Continue involvement with the support group through [place] consent for ILD-pro registry study   - Research staff will prescreen you and will contact you if you are eligible = Do HRCT supine/prone next few days to few weeks    Follow-up --next few to several weeks but after completing CT and heart cath  - basedon results - discussion on daily prednisone, versus teprostinil, versus PULSE study     SIGNATURE    Dr. Brand Males, M.D., F.C.C.P,  Pulmonary and Critical Care Medicine Staff Physician, [place] Director - Interstitial Lung Disease  Program  Pulmonary [place] at [place], [place], [address]  Pager: [phone], If no answer or between   15:00h - 7:00h: call [phone] Telephone: [phone]  4:26 PM 09/24/2020

## 2020-09-24 NOTE — H&P (View-Only) (Signed)
OV 07/29/2019 -transfer of care to Dr. Chase Caller interstitial lung disease center.  Subjective:  Patient ID: [name], female , DOB: Dec 26, 1940 , age 80 y.o. , MRN: [id number] , ADDRESS: [place] [id number]   07/29/2019 -   Chief Complaint  Patient presents with  . Follow-up    Pt states her breathing has become worse since last visit. States she also has an occ cough.   #Interstitial lung disease (progressive) with chronic hypoxemic respiratory failure - chronic HP dx -Per [facility] September 2016: Disease present at least since 2006/07/30  - Surgical lung biopsy  November 20, 2014 at [place]:   - local opinion: diagnostic of usual interstitial pneumonia (UIP). Interestingly, there are scattered non-necrotizing granulomata in the pleura.  -Second opinion September 2016 at [facility]: pathology was reviewed at our multidisciplinary ILD conference and noted to have a variegated pattern of fibrosis with some airway centricity, granulomas noted both near the airway and more peripherally. Minimal inflammation was noted. Features not c/w with UIP. Few giant cells with cholesterol clefts. Overall pattern most in keeping with chronic HP  -High-resolution CT chest including April 09, 2018: Lack of craniocaudal gradient and significant air trapping suggestive of chronic hypersensitivity pneumonitis [September 2014-07-30 [facility] evaluation thought CT was inconsistent with UIP]  -Clinical diagnosis of chronic hypersensitivity pneumonitis   - dx given Sept 2016 at [place]  -No clear-cut exposure identified at [facility] evaluation September 2016 by Dr. Dorothyann Peng  -Steroids recommended but patient preferred not to take it September 2016 at [place]   - deferred steroids sept July 30, 2014 visit at [facility] with Dr Sherri Sear  - nintedanib since March 2020   #Small hiatal hernia with acid reflux disease  HPI [name] 80 y.o. -is a transfer of care from Dr.  Lake Bells to Dr. Chase Caller with interstitial lung disease questionnaire.  She was last seen by Dr. Lake Bells in March 2020.  After the onset of the COVID-19 pandemic Dr. Lake Bells is now fully in charge of the Covid hospital [facility].  Therefore he is not in the outpatient medical practice anymore.  Therefore patient has been transferred to the ILD center to Dr. Chase Caller myself.  Is the first time I am meeting her and getting to know her.  She tells me that I took care of her husband for COPD a few years ago.  This was her ex-husband.  He passed away in 07-31-2011 after hospitalization to the ICU under our service.  She is grateful for the care.  She tells me that she has interstitial lung disease and for the last year she has been on nintedanib.  Currently she is having difficulty with nintedanib supply.  She only has 2 weeks left.  She uses 3 L of oxygen at rest.  She says she has been doing that for the last 4 years.  It is stable usage.  I am not really sure what her pulse ox on room air would be.  She says occasionally she would crank it up to 4 L for exertion but she generally uses 3 L both at rest and with exertion.  She says she tolerates nintedanib fine. Her interstitial lung disease history was reviewed and summarized above.  It appears that she was not keen on taking prednisone and her July 30, 2014 visit with Dr. Dorothyann Peng at [facility].  Therefore once the [facility] data was available she was started on nintedanib a year ago.She wants  to have a son involved in her visits.  Because of the COVID-19 pandemic he is waiting outside in the car.  She says at the next visit she wants to bring him in.   Her symptom score is listed below.  Her progressive lung function decline is also listed below and the pulmonary function test reviewed.  I personally reviewed and visualized and interpreted those.  Her DLCO is paradoxically higher.  I think this is an error.   ROS - per HPI  OV  10/03/2019  Subjective:  Patient ID: [name], female , DOB: 12-25-1940 , age 89 y.o. 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 Interestingly, there are scattered non-necrotizing granulomata in the pleura.  -Second opinion September 2016 at [facility]: pathology was reviewed at our multidisciplinary ILD conference and noted to have a variegated pattern of fibrosis with some airway centricity, granulomas noted both near the airway and more peripherally. Minimal inflammation was noted. Features not c/w with UIP. Few giant cells with cholesterol clefts. Overall pattern most in keeping with chronic HP  -High-resolution CT chest including April 09, 2018: Lack of craniocaudal gradient and significant air trapping suggestive of chronic hypersensitivity pneumonitis [September 2016 [facility] evaluation thought CT was inconsistent with UIP]  -Clinical diagnosis of chronic hypersensitivity pneumonitis   - dx given Sept 2016 at [place]  -No clear-cut exposure identified at [facility] evaluation September 2016 by Dr. Dorothyann Peng  -Steroids recommended but patient preferred not to take it September 2016 at [place]   - deferred steroids sept 2016 visit at [facility] with Dr Sherri Sear  - nintedanib since March 2020   #Small hiatal hernia with acid reflux disease  10/03/2019 -   Chief Complaint  Patient presents with  . Follow-up    SOB and cough with activity has increasd, Productive cough with yellow sputum     HPI [name] 80 y.o. -returns for follow-up with her son.  At this point in time the focus is to establish the true nature of interstitial lung  disease.  She has brought her son with her.  However approximately 2 weeks ago she had acute bronchitis symptoms and called in.  Was given a cephalexin 5 days and prednisone 5 days.  These have helped immensely.  However she feels not back to baseline.  She feels she will benefit from another round of antibiotic with or without prednisone.  She finished ILD questionnaire and it is listed below.  We have not been able to discuss in a multidisciplinary case conference of 4. [facility] Comprehensive ILD Questionnaire  Symptoms: See below.    SYMPTOM SCALE - ILD 07/29/2019  10/03/2019   O2 use 3L at rest, occ 4L with exertion 3L at rest and 4L with exertion. Acute bronchitis 2 weeks ago  Shortness of Breath 0 -> 5 scale with 5 being worst (score 6 If unable to do)   At rest 0 1  Simple tasks - showers, clothes change, eating, shaving 1 2  Household (dishes, doing bed, laundry) 2 3  Shopping 2 2  Walking level at own pace 1 2  Walking up Stairs 6 4  Total (30-36) Dyspnea Score  12 14  How bad is your cough? 1 3.5  How bad is your fatigue 0 3.5  How bad is nausea 0 0  How bad is vomiting?  0 0  How bad is diarrhea? 0 0  How bad is anxiety? 1 1.5  How bad is depression 1 1     Past Medical History :  -Denies any collagen vascular disease or vasculitis.  Denies any knowledge of prior pulmonary hypertension.  Denies diabetes or thyroid disease.  Denies any stroke.  Denies mononucleosis denies tuberculosis denies pneumonia denies blood clots denies heart disease denies pleurisy   ROS: Positive for shoulder pain for the last several years but otherwise no dry eyes.  No Raynaud's.  No weight loss.  No nausea no vomiting no rash   FAMILY HISTORY of LUNG DISEASE: Denies any pulmonary fibrosis or COPD or cystic fibrosis of hypersensitive pneumonitis or any other lung disease.   EXPOSURE HISTORY: Never smoked cigarettes.  No passive smoking.  No marijuana.  No vaping no cocaine no  intravenous drug use.   HOME and HOBBY DETAILS : Current home is 25 years she is lived there for 5 years.  In [place] there is no dampness.  No mold or mildew no feather pillow.  No steam iron use no Jacuzzi use no nebulizer use no humidifier use.  No pet birds or parakeets.  No pet gerbils no pet hamsters no pet rabbits no rodents.  No mold in the [facility] duct no music habits such as wind instruments no gardening.  No flood of water damage.  No strong mats.  No exposure to animals at work.   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  She also had a long-term tendency to clean her home heavily with Clorox.  She inhaled these fumes according to the son.  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   PULMONARY TOXICITY HISTORY (27 items): She has done prednisone burst.  She does not want to do long-term prednisone.  She has been on long-term nintedanib but tolerating it well.    Testing -2018 echo with elevated pulmonary systolic pressure 55 mmHg  - nov 2019 Last CT - HRCT IMPRESSION: 1. Spectrum of findings compatible with fibrotic interstitial lung disease with extensive air trapping and absence of basilar gradient, strongly favoring chronic hypersensitivity pneumonitis. Findings have slightly progressed since 04/24/2017, with more clear progression since more remote chest CT of 06/24/2014. Findings are suggestive of an alternative diagnosis (not UIP) per consensus guidelines: Diagnosis of Idiopathic Pulmonary Fibrosis: An Official ATS/ERS/JRS/ALAT Clinical Practice Guideline. [place], Iss 5, [phone], Feb 04 2017. 2. Stable mild mediastinal lymphadenopathy, most compatible with benign reactive adenopathy. 3. One vessel coronary atherosclerosis. 4.  Small hiatal hernia.  Aortic Atherosclerosis (ICD10-I70.0).     Results for LIDWINA, KANER (MRN [id number]) as of 07/29/2019 11:43  Ref. Range 07/04/2014 12:58 09/03/2015 11:04 09/27/2016 13:56 05/01/2017 14:05 07/19/2019 15:54  FVC-Pre Latest Units: L 2.13 1.93 1.86 1.71 1.61  FVC-%Pred-Pre Latest Units: % 66 61 59 55 53   Results for SUDA, FORBESS (MRN [id number]) as of 07/29/2019 11:43  Ref. Range 07/04/2014 12:58 09/03/2015 11:04 09/27/2016 13:56 05/01/2017 14:05 07/19/2019 15:54  DLCO unc Latest Units: ml/min/mmHg  13.70 14.70 12.69 11.23 17.11  DLCO unc % pred Latest Units: % 48 51 44 39 82     ROS  OV 12/03/2019  Subjective:  Patient ID: [name], female , DOB: [birth year] , age 83 y.o. , MRN: [id number] , ADDRESS: [place] [address]   12/03/2019 -   Chief Complaint  Patient presents with  . 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 Interestingly, there are scattered non-necrotizing granulomata in the pleura.  -Second opinion September 2016 at [facility]: pathology was reviewed at our multidisciplinary ILD conference and noted to have a variegated pattern of fibrosis with some airway centricity, granulomas noted both near the airway and more peripherally. Minimal inflammation was noted. Features not c/w with UIP. Few giant cells with cholesterol clefts. Overall pattern most in keeping with chronic HP   - 3rd opinon: June 2021 [facility] ILD [place] - HP   -High-resolution CT chest including April 09, 2018: Lack of craniocaudal gradient and significant air trapping suggestive of  chronic hypersensitivity pneumonitis [September 2016 [facility] evaluation thought CT was inconsistent with UIP]  -Clinical diagnosis of chronic hypersensitivity pneumonitis   - dx given Sept 2016 at [place]  -No clear-cut exposure identified at [facility] evaluation September 2016 by Dr. Dorothyann Peng  -Steroids recommended but patient preferred not to take it September 2016 at [place]   - deferred steroids sept 2016 visit at [facility] with Dr Sherri Sear  - nintedanib since March 2020   #Small hiatal hernia with acid reflux disease   HPI [name] 80 y.o. -presents with her son for follow-up.  She continues to be on 3 L at rest 4 L with exertion.  Her acute bronchitis from April 2021 is resolved.  Her overall symptoms are stable/slightly better after her acute bronchitis.  There is no interim complaints.  In the interim we did discuss her at her case conference.  The consensus diagnosis is that she has hypersensitive pneumonitis.  The only exposure is when she was working in the [employer] [place] at in [place].  She and her son again denies any ongoing exposures.  Reviewed her pulmonary function test and it is stable compared to the recent ones.  There is progression over the last few years to several years.  In terms of a high-resolution CT chest is also stable since 2019 through 2021 but progressive when compared to earlier time points.  She was started on nintedanib in March 2020.  She is tolerating this quite well.  It appears that she and she went on nintedanib she is stable.  She yet to do pulmonary rehabilitation.  Last done few years ago.  Son feels that she needs to be more mobile She is yet to connect with the support group She did not get referred to cardiology for right heart catheterization. She wants a flutter valve for her associated bronchiectasis seen on CT scan    OV 03/09/2020   Subjective:  Patient ID: [name], female , DOB: [birth year], age 47 y.o. years. , MRN:  [id number],  ADDRESS: [place] [address] PCP  Harrington Challenger,  Dwyane Luo, MD Providers : Treatment Team:  Attending Provider: Brand Males, MD   Chief Complaint  Patient presents with  . Follow-up    Patient wears 4 liters oxygen with exertion, was prescribed celebrex they want to know if it is ok with OFEV.      #Interstitial lung disease (progressive) with chronic hypoxemic respiratory failure - chronic HP dx -Per [facility] September 2016: Disease present at least since 2008  - Surgical lung biopsy  November 20, 2014 at [place]:   - local opinion: diagnostic of usual interstitial pneumonia (UIP). Interestingly, there are scattered non-necrotizing granulomata in the pleura.  -Second opinion September 2016 at [facility]: pathology was reviewed at our multidisciplinary ILD conference and noted to have a variegated pattern of fibrosis with some airway centricity, granulomas noted both near the airway and more peripherally. Minimal inflammation was noted. Features not c/w with UIP. Few giant cells with cholesterol clefts. Overall pattern most in keeping with chronic HP   - 3rd opinon: June 2021 [facility] [place] - Hypersensitivity Pneumonitis   -High-resolution CT chest including April 09, 2018: Lack of craniocaudal gradient and significant air trapping suggestive of chronic hypersensitivity pneumonitis [September 2016 [facility] evaluation thought CT was inconsistent with UIP]  -Clinical diagnosis of chronic hypersensitivity pneumonitis   - dx given Sept 2016 at [place]  -No clear-cut exposure identified at [facility] evaluation September 2016 by Dr. Dorothyann Peng  -Steroids recommended but patient preferred not to take it September 2016 at [place]   - deferred steroids sept 2016 visit at [facility] with Dr Dorothyann Peng and again at [facility] in June 2021  - nintedanib sole Rx since March 2020     HPI [name] 80 y.o. -returns for follow-up with  her son Simona Huh.  Since her last visit in June 2020 when she continues to be stable.  She is enrolled in the pulmonary rehabilitation program which has helped her fatigue and shortness of breath significantly.  She continues on nintedanib without any adverse side effects.  She wanted to know if it is okay to take cephalexin for arthralgia.  She has chronic osteoarthritis.  She wants to avoid NSAIDs.  In terms of oxygen use she is stable at 4 L nasal cannula.  She recently had pulmonary function test I reviewed this and it shows stability and is documented below.  Her weight continues to be stable.  We referred her for right heart catheterization with because of the Covid delta surge she deferred it.  Of note on February 28, 2020 while trying to get into the car out of her primary care physician's office she fell down and sustained abrasions and bruises injuries particularly in her lower extremity.  Since then her right kneecap patella area is red and warm.  There is a small incision injury there but this is healed.  She thinks it slightly better/stable.  She is not having any fever.  This has not been treated.    SYMPTOM SCALE - ILD 07/29/2019  10/03/2019  12/03/2019  03/09/2020   O2 use 3L at rest, occ 4L with exertion 3L at rest and 4L with exertion. Acute bronchitis 2 weeks ago 3L rest, 4 L exertion 3-4L [place] ofev. Doing rehab  Shortness of Breath 0 -> 5 scale with 5 being worst (score 6 If unable to do)     At rest 0 1 0 0  Simple tasks - showers, clothes change, eating, shaving _0 2  Household (dishes, doing bed, laundry) _0 Shopping _1 Walking level at own pace _2 Walking up [place] [place] _3 Total (30-36) Dyspnea Score _4 How bad is your cough? 1 3.5  More often lateley  How bad is your fatigue 0 3.5 2 Better with exercise  How bad is nausea 0 0 0 0  How bad is vomiting?  0 0 0 0  How bad is diarrhea? 0 0 0 0  How bad is anxiety? 1 1._5 How bad is  depression _6 PFT Results Latest Ref Rng & Units 03/05/2020 12/03/2019 07/19/2019 05/01/2017 09/27/2016 09/03/2015 07/04/2014  FVC-Pre L 1.70 1.66 1.61 1.71 1.86 1.93 2.13  FVC-Predicted Pre % 57 55 53 55 59 61 66  FVC-Post L - - 1.87 1.72 1.91 2.03 2.26  FVC-Predicted Post % - - 61 55 61 64 70  Pre FEV1/FVC % % 81 82 81 87 86 87 89  Post FEV1/FCV % % - - 78 90 89 88 88  FEV1-Pre L 1.39 1.36 1.31 1.50 1.60 1.68 1.90  FEV1-Predicted Pre % 62 60 57 64 68 70 78  FEV1-Post L - - 1.46 1.56 1.70 1.78 2.00  DLCO uncorrected ml/min/mmHg 15.25 13.20 17.11 11.23 12.69 14.70 13.70  DLCO UNC% % 73 63 82 39 44 51 48  DLCO corrected ml/min/mmHg 15.25 13.20 - 10.63 12.07 14.09 -  DLCO COR %Predicted % 73 63 - 37 42 49 -  DLVA Predicted % 114 103 120 70 80 78 79  TLC L - - 3.63 3.37 3.49 - 4.00  TLC % Predicted % - - 65 61 63 - 72  RV % Predicted % - - 73 63 64 - 58     ROS - per HPI  CT chest high resolution - may 2021  IMPRESSION: 1. Spectrum of findings compatible with fibrotic interstitial lung disease with moderate air trapping and absence of apicobasilar gradient. Findings are most compatible with chronic hypersensitivity pneumonitis. No interval progression since 2019 chest CT. Evidence of progression since baseline 2016 chest CT. Findings are suggestive of an alternative diagnosis (not UIP) per consensus guidelines: Diagnosis of Idiopathic Pulmonary Fibrosis: An Official ATS/ERS/JRS/ALAT Clinical Practice Guideline. [place], Iss 5, [phone], Feb 04 2017. 2. Stable mild mediastinal lymphadenopathy, compatible with benign reactive adenopathy. 3. Small hiatal hernia. 4. Aortic Atherosclerosis (ICD10-I70.0).   Electronically Signed   By: Ilona Sorrel M.D.   On: 10/24/2019 15:02    OV 09/24/2020  Subjective:  Patient ID: [name], female , DOB: 1940-06-15 , age 65 y.o. , MRN: [id number] , ADDRESS: [place]  [address] PCP Lawerance Cruel, MD Patient Care Team: Lawerance Cruel, MD as PCP - General (Family Medicine) Tamsen Roers, MD as Referring Physician (Family Medicine) Melrose Nakayama, MD as Consulting Physician (Cardiothoracic Surgery)  This Provider for this visit: Treatment Team:  Attending Provider: Brand Males, MD    09/24/2020 -   Chief Complaint  Patient presents with  . Follow-up    Review PFT results. Still reports cough, sometimes productive with clear phlegm.      #Interstitial lung disease (progressive) with chronic hypoxemic respiratory failure - chronic HP dx -Per [facility] September 2016: Disease present at least since 2008  - Surgical lung biopsy  November 20, 2014 at [place]:   - local opinion: diagnostic of usual interstitial pneumonia (UIP). Interestingly, there are scattered non-necrotizing granulomata in the pleura.  -Second opinion September 2016 at [facility]: pathology was reviewed at our multidisciplinary ILD conference and noted to have a variegated pattern of fibrosis with some airway centricity, granulomas noted both near the airway and more peripherally. Minimal inflammation was noted. Features not c/w with UIP. Few giant cells with cholesterol clefts. Overall pattern most in keeping with chronic HP   - 3rd opinon: June 2021 [facility] IL[facility] [place] - Hypersensitivity Pneumonitis   -High-resolution CT chest including April 09, 2018: Lack of craniocaudal gradient and significant air trapping suggestive of chronic hypersensitivity pneumonitis [September 2016 [facility] evaluation thought CT was inconsistent with UIP]  - May 2021: stability since 2019 but progression sice 2016  -Clinical diagnosis of chronic hypersensitivity pneumonitis   - dx given Sept 2016 at [place]  -No clear-cut exposure identified at [facility] evaluation September 2016 by Dr. Dorothyann Peng  -Steroids recommended but patient preferred not to take it September 2016 at [place]   - deferred steroids sept 2016 visit at [facility] with Dr Dorothyann Peng and again at [facility] in June 2021, Apr 2022  - nintedanib sole Rx since March 2020       HPI [place] VANNIE HILGERT 80 y.o. -presents for follow-up.  Last seen in the fall 2021.  After that overall she is been stable but last month she picked up a respiratory infection got antibiotic and prednisone and then she is better.  Nevertheless she thinks her symptoms may be a little worse than baseline.  Still she says she gets by with 3-4 L of oxygen which is her baseline.  Subjective symptom score overall has not changed.  She had pulmonary function test today and this shows a decline it is all documented below.  I visualized this.  Her last CT scan of the chest was in May 2021.  1 year anniversary of the CT scan is coming up.  Over the winter she declined to have right heart catheterization because of COVID-19.  But at this point in time with the improvement in the pandemic prevalence rate in the local community of COVID she is willing to have a right heart catheterization.  She is tolerating nintedanib just fine without any problems.  A walking desaturation test on room air is as below    SYMPTOM SCALE - ILD 07/29/2019  10/03/2019  12/03/2019  03/09/2020  09/24/2020   O2 use 3L at rest, occ 4L with exertion 3L at rest and 4L with exertion. Acute bronchitis 2 weeks ago 3L rest, 4 L exertion 3-4L [place] On ofev. Doing rehab 3/4L  pulse . On ofev  Shortness of Breath 0 -> 5 scale with 5 being worst (score 6 If unable to do)      At rest 0 1 0 0 0  Simple tasks - showers, clothes change, eating, shaving _0 Household (dishes, doing bed, laundry) _1 2.5  Shopping _2 2.5  Walking level at own pace _3 Walking up Stairs _4 Total (30-36) Dyspnea Score _5 How bad is your cough? 1 3.5  More often lateley 2  How bad is your fatigue 0 3.5 2 Better with exercise 2.5  How bad is nausea  0 0 0 0 0  How bad is vomiting?  0 0 0 0 0  How bad is diarrhea? 0 0 0 0 0  How bad is anxiety? 1 1._0 How bad is depression _1 0    Simple office walk 185 feet x  3 laps goal with forehead probe 09/24/2020   O2 used ra (baseline uses 3-4L at home)  Number laps completed End of 1 lap out of intented 3  Comments about pace slow  Resting Pulse Ox/HR 94% and 87/min  Final Pulse Ox/HR 85% and 113/min  Desaturated </= 88% yes  Desaturated <= 3% points yes  Got Tachycardic >/= 90/min yes  Symptoms at end of test Mod dyspnea  Miscellaneous comments desat at end iof 1 lap. STable on room air       PFT  PFT Results Latest Ref Rng & Units 09/24/2020 12/03/2019 12/03/2019 07/19/2019 05/01/2017 09/27/2016 09/03/2015  FVC-Pre L 1.52 1.70 1.66 1.61 1.71 1.86 1.93  FVC-Predicted Pre % 50 57 55 53 55 59 61  FVC-Post L - - - 1.87 1.72 1.91 2.03  FVC-Predicted Post % - - - 61 55 61 64  Pre FEV1/FVC % % 80 81 82 81 87 86 87  Post FEV1/FCV % % - - - 78 90 89 88  FEV1-Pre L 1.21 1.39 1.36 1.31 1.50 1.60 1.68  FEV1-Predicted Pre % 54 62 60 57 64 68 70  FEV1-Post L - - - 1.46 1.56 1.70 1.78  DLCO uncorrected ml/min/mmHg 5.95 15.25 13.20 17.11 11.23 12.69 14.70  DLCO UNC% % 28 73 63 82 39 44 51  DLCO corrected ml/min/mmHg 5.95 15.25 13.20 - 10.63 12.07 14.09  DLCO COR %Predicted % 28 73 63 - 37 42 49  DLVA Predicted % 67 114 103 120 70 80 78  TLC L - - - 3.63 3.37 3.49 -  TLC % Predicted % - - - 65 61 63 -  RV % Predicted % - - - 73 63 64 -       has a past medical history of Allergic rhinitis, Anxiety, Arthritis, Asthma, Carpal tunnel syndrome, CHF (congestive heart failure) ([place]), GERD (gastroesophageal reflux disease), History of bronchitis (3-89yr ago), History of colon polyps, Joint pain, Peripheral edema, Pneumonitis, hypersensitivity ([place] (05/2016), Pulmonary hypertension ([place] (06/30/2017), Shortness of breath dyspnea, and Thyroid nodule.   reports that she has never smoked.  She has never used smokeless tobacco.  Past Surgical History:  Procedure Laterality Date  . BREAST CYST EXCISION Left 49 yrs ago  . CARPELL TUNNELL SURG Right   . COLONOSCOPY    . ESOPHAGOGASTRODUODENOSCOPY    . EYE SURGERY     bilateral cataract surgery with lens implants  . KNEE ARTHROSCOPY W/ MENISCAL REPAIR     left  . LUNG BIOPSY Right 11/20/2014   Procedure: LUNG BIOPSY;  Surgeon: SMelrose Nakayama MD;  [facility]  Service: Thoracic;  Laterality: Right;  . REVERSE SHOULDER ARTHROPLASTY Right 06/07/2018   Procedure: REVERSE SHOULDER ARTHROPLASTY;  Surgeon: SJustice Britain MD;  Location: [facility];  Service: Orthopedics;  Laterality: Right;  1255m  . TOTAL KNEE ARTHROPLASTY Left 08/13/2013   Procedure: LEFT TOTAL KNEE ARTHROPLASTY;  Surgeon: MaMauri PoleMD;  Location: [facility];  Service: Orthopedics;  Laterality: Left;  . UMBILICAL HERNIA REPAIR    . VIDEO ASSISTED THORACOSCOPY Right 11/20/2014   Procedure: RIGHT  VIDEO ASSISTED THORACOSCOPY WITH WEDGE LUNG BIOPSIES RIGHT UPPER,MIDDLE AND LOWER LOBES ,PLACEMENT OF ON Q PAIN  PUMP;  Surgeon: Melrose Nakayama, MD;  Location: [facility];  Service: Thoracic;  Laterality: Right;    Allergies  Allergen Reactions  . Adhesive [Tape] Itching and Rash  . Latex Itching and Swelling  . Doxycycline Rash  . Sulfonamide Derivatives Other (See Comments)    Unknown    Immunization History  Administered Date(s) Administered  . Fluad Quad(high Dose 65+) 02/07/2019  . Influenza, High Dose Seasonal PF 04/20/2016, 02/23/2017, 02/12/2018  . Influenza-Unspecified 03/06/2014, 02/18/2015  . PFIZER(Purple Top)SARS-COV-2 Vaccination 07/12/2019, 08/06/2019, 04/12/2020  . Pneumococcal Conjugate-13 03/06/2014  . Tdap 06/14/2017    Family History  Problem Relation Age of Onset  . Tuberculosis Father   . Tuberculosis Paternal Uncle      Current Outpatient Medications:  .  albuterol (VENTOLIN HFA) 108 (90 Base) MCG/ACT inhaler, Inhale 2 puffs  into the lungs every 6 (six) hours as needed for wheezing or shortness of breath., Disp: 18 g, Rfl: 6 .  fluticasone (FLONASE) 50 MCG/ACT nasal spray, Place 1 spray into both nostrils daily as needed for allergies. , Disp: , Rfl:  .  loratadine (CLARITIN) 5 MG chewable tablet, Chew 5-10 mg by mouth daily as needed for allergies. , Disp: , Rfl:  .  OFEV 150 MG CAPS, TAKE 1 CAPSULE (150 MG TOTAL) BY MOUTH TWO TIMES DAILY., Disp: 60 capsule, Rfl: 3 .  sodium chloride HYPERTONIC 3 % nebulizer solution, Take 4 mLs by nebulization daily., Disp: 480 mL, Rfl: 0 .  sodium chloride HYPERTONIC 3 % nebulizer solution, Take 4 mLs by nebulization daily., Disp: 480 mL, Rfl: 0 .  aspirin 325 MG tablet, Take 650 mg by mouth daily as needed for moderate pain or headache., Disp: , Rfl:  .  Cholecalciferol (DIALYVITE VITAMIN D 5000) 125 MCG (5000 UT) capsule, Take 5,000 Units by mouth daily., Disp: , Rfl:  .  ibuprofen (ADVIL) 200 MG tablet, Take 200-400 mg by mouth every 6 (six) hours as needed for headache or moderate pain., Disp: , Rfl:  .  Multiple Vitamin (MULTIVITAMIN WITH MINERALS) TABS tablet, Take 1 tablet by mouth daily., Disp: , Rfl:  .  Respiratory Therapy Supplies (FLUTTER) DEVI, Use as directed., Disp: 1 each, Rfl: 0 .  vitamin B-12 (CYANOCOBALAMIN) 1000 MCG tablet, Take 1,000 mcg by mouth every other day., Disp: , Rfl:       Objective:   Vitals:   09/24/20 1550  BP: 128/74  Pulse: 79  Temp: 97.8 F (36.6 C)  TempSrc: Temporal  SpO2: 96%  Weight: 214 lb 3.2 oz (97.2 kg)  Height: _0  (1.702 m)    Estimated body mass index is 33.55 kg/m as calculated from the following:   Height as of this encounter: _1  (1.702 m).   Weight as of this encounter: 214 lb 3.2 oz (97.2 kg).  _2 @  Filed Weights   09/24/20 1550  Weight: 214 lb 3.2 oz (97.2 kg)     Physical Exam   General: No distress.  Obese lady Neuro: Alert and Oriented x 3. GCS 15. Speech normal Psych:  Pleasant Resp:  Barrel Chest - no.  Wheeze - no, Crackles - yes, diffuse esp UL, No overt respiratory distress CVS: Normal heart sounds. Murmurs - no Ext: Stigmata of Connective Tissue Disease - no HEENT: Normal upper airway. PEERL +. No post nasal drip        Assessment:       ICD-10-CM   1. 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 Chronic respiratory failure with hypoxia (H[facility])  J96.11   3. Interstitial lung disease ([place])  J84.9   4. 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 Highly concerned moe progressive in April 2022 compared to 2021 after respiratory infeciton in [facility] 2022  - Understood/noted baseline reluctance towards prednisone  Plan   -Continue nintedanib as before  - Check LFT 09/24/2020 -Re-refer cardiology (Dr Loralie Champagne or Dr Pierre Bali or Dr Adrian Prows) for right heart cath asap --Continue involvement with the support group through [place] consent for ILD-pro registry study   - Research staff will prescreen you and will contact you if you are eligible = Do HRCT supine/prone next few days to few weeks    Follow-up --next few to several weeks but after completing CT and heart cath  - basedon results - discussion on daily prednisone, versus teprostinil, versus PULSE study     SIGNATURE    Dr. Brand Males, M.D., F.C.C.P,  Pulmonary and Critical Care Medicine Staff Physician, [place] Director - Interstitial Lung Disease  Program  Pulmonary [place] at [place], [place], [address]  Pager: [phone], If no answer or between   15:00h - 7:00h: call [phone] Telephone: [phone]  4:26 PM 09/24/2020

## 2020-09-24 NOTE — Progress Notes (Signed)
Spirometry and DLCO completed today  ?

## 2020-09-25 LAB — HEPATIC FUNCTION PANEL
ALT: 13 U/L (ref 0–35)
AST: 18 U/L (ref 0–37)
Albumin: 3.8 g/dL (ref 3.5–5.2)
Alkaline Phosphatase: 67 U/L (ref 39–117)
Bilirubin, Direct: 0.1 mg/dL (ref 0.0–0.3)
Total Bilirubin: 0.3 mg/dL (ref 0.2–1.2)
Total Protein: 7.1 g/dL (ref 6.0–8.3)

## 2020-09-28 NOTE — Telephone Encounter (Signed)
Spoke with pt and offered her 5/11, 5/12, and 5/18. She will call back after she speaks with her son to schedule appt.

## 2020-09-29 ENCOUNTER — Ambulatory Visit (HOSPITAL_COMMUNITY)
Admission: RE | Admit: 2020-09-29 | Discharge: 2020-09-29 | Disposition: A | Discharge status: Home / Self Care | Payer: Medicare Other | Source: Ambulatory Visit | Attending: Internal Medicine | Admitting: Internal Medicine

## 2020-09-29 ENCOUNTER — Other Ambulatory Visit: Payer: Self-pay

## 2020-09-29 DIAGNOSIS — I2510 Atherosclerotic heart disease of native coronary artery without angina pectoris: Secondary | ICD-10-CM | POA: Diagnosis not present

## 2020-09-29 DIAGNOSIS — I358 Other nonrheumatic aortic valve disorders: Secondary | ICD-10-CM | POA: Diagnosis not present

## 2020-09-29 DIAGNOSIS — Z5181 Encounter for therapeutic drug level monitoring: Secondary | ICD-10-CM | POA: Insufficient documentation

## 2020-09-29 DIAGNOSIS — J679 Hypersensitivity pneumonitis due to unspecified organic dust: Secondary | ICD-10-CM | POA: Insufficient documentation

## 2020-09-29 DIAGNOSIS — J9611 Chronic respiratory failure with hypoxia: Secondary | ICD-10-CM | POA: Insufficient documentation

## 2020-09-29 DIAGNOSIS — J849 Interstitial pulmonary disease, unspecified: Secondary | ICD-10-CM | POA: Insufficient documentation

## 2020-09-29 DIAGNOSIS — J439 Emphysema, unspecified: Secondary | ICD-10-CM | POA: Diagnosis not present

## 2020-09-29 DIAGNOSIS — J479 Bronchiectasis, uncomplicated: Secondary | ICD-10-CM | POA: Diagnosis not present

## 2020-09-30 ENCOUNTER — Encounter (HOSPITAL_COMMUNITY): Payer: Self-pay | Admitting: *Deleted

## 2020-09-30 ENCOUNTER — Other Ambulatory Visit (HOSPITAL_COMMUNITY): Payer: Self-pay | Admitting: *Deleted

## 2020-09-30 ENCOUNTER — Telehealth (HOSPITAL_COMMUNITY): Payer: Self-pay | Admitting: Internal Medicine

## 2020-09-30 DIAGNOSIS — J849 Interstitial pulmonary disease, unspecified: Secondary | ICD-10-CM

## 2020-09-30 NOTE — Telephone Encounter (Signed)
Pt returned call cath scheduled 5/11.

## 2020-10-08 ENCOUNTER — Telehealth: Payer: Self-pay | Admitting: Internal Medicine

## 2020-10-08 NOTE — Telephone Encounter (Signed)
[name]/[name]  [name] is schedule for [facility] 10/14/20 (not sure which one of you). I got a CT chest on her and it says aortic valve calcifications and coronary arter caclifications.  I can order an echo (last in 2018) but wodnering if you want to tack on a  Left Heart Cath (if so she will need to know ahead of time)  Thanks  MR

## 2020-10-12 ENCOUNTER — Other Ambulatory Visit (HOSPITAL_COMMUNITY)
Admission: RE | Admit: 2020-10-12 | Discharge: 2020-10-12 | Disposition: A | Discharge status: Home / Self Care | Payer: Medicare Other | Source: Ambulatory Visit | Attending: Cardiology | Admitting: Cardiology

## 2020-10-12 DIAGNOSIS — Z01812 Encounter for preprocedural laboratory examination: Secondary | ICD-10-CM | POA: Insufficient documentation

## 2020-10-12 DIAGNOSIS — Z20822 Contact with and (suspected) exposure to covid-19: Secondary | ICD-10-CM | POA: Diagnosis not present

## 2020-10-13 LAB — SARS CORONAVIRUS 2 (TAT 6-24 HRS): SARS Coronavirus 2: NEGATIVE

## 2020-10-14 ENCOUNTER — Other Ambulatory Visit: Payer: Self-pay

## 2020-10-14 ENCOUNTER — Ambulatory Visit (HOSPITAL_COMMUNITY)
Admission: RE | Admit: 2020-10-14 | Discharge: 2020-10-14 | Disposition: A | Discharge status: Home / Self Care | Payer: Medicare Other | Attending: Cardiology | Admitting: Cardiology

## 2020-10-14 ENCOUNTER — Ambulatory Visit (HOSPITAL_COMMUNITY)
Admission: RE | Disposition: A | Discharge status: Home / Self Care | Payer: Self-pay | Source: Home / Self Care | Attending: Cardiology

## 2020-10-14 DIAGNOSIS — Z79899 Other long term (current) drug therapy: Secondary | ICD-10-CM | POA: Diagnosis not present

## 2020-10-14 DIAGNOSIS — J9611 Chronic respiratory failure with hypoxia: Secondary | ICD-10-CM | POA: Insufficient documentation

## 2020-10-14 DIAGNOSIS — J849 Interstitial pulmonary disease, unspecified: Secondary | ICD-10-CM | POA: Diagnosis not present

## 2020-10-14 DIAGNOSIS — J679 Hypersensitivity pneumonitis due to unspecified organic dust: Secondary | ICD-10-CM | POA: Diagnosis not present

## 2020-10-14 DIAGNOSIS — Z5181 Encounter for therapeutic drug level monitoring: Secondary | ICD-10-CM | POA: Insufficient documentation

## 2020-10-14 DIAGNOSIS — Z7982 Long term (current) use of aspirin: Secondary | ICD-10-CM | POA: Insufficient documentation

## 2020-10-14 DIAGNOSIS — Z888 Allergy status to other drugs, medicaments and biological substances status: Secondary | ICD-10-CM | POA: Diagnosis not present

## 2020-10-14 DIAGNOSIS — I272 Pulmonary hypertension, unspecified: Secondary | ICD-10-CM | POA: Diagnosis not present

## 2020-10-14 DIAGNOSIS — I2510 Atherosclerotic heart disease of native coronary artery without angina pectoris: Secondary | ICD-10-CM | POA: Diagnosis not present

## 2020-10-14 DIAGNOSIS — Z882 Allergy status to sulfonamides status: Secondary | ICD-10-CM | POA: Diagnosis not present

## 2020-10-14 DIAGNOSIS — Z91040 Latex allergy status: Secondary | ICD-10-CM | POA: Insufficient documentation

## 2020-10-14 HISTORY — PX: RIGHT/LEFT HEART CATH AND CORONARY ANGIOGRAPHY: CATH118266

## 2020-10-14 LAB — POCT I-STAT EG7
Acid-Base Excess: 5 mmol/L — ABNORMAL HIGH (ref 0.0–2.0)
Acid-Base Excess: 5 mmol/L — ABNORMAL HIGH (ref 0.0–2.0)
Bicarbonate: 32.8 mmol/L — ABNORMAL HIGH (ref 20.0–28.0)
Bicarbonate: 32.8 mmol/L — ABNORMAL HIGH (ref 20.0–28.0)
Calcium, Ion: 1.22 mmol/L (ref 1.15–1.40)
Calcium, Ion: 1.23 mmol/L (ref 1.15–1.40)
HCT: 42 % (ref 36.0–46.0)
HCT: 43 % (ref 36.0–46.0)
Hemoglobin: 14.3 g/dL (ref 12.0–15.0)
Hemoglobin: 14.6 g/dL (ref 12.0–15.0)
O2 Saturation: 76 %
O2 Saturation: 75 %
Potassium: 3.7 mmol/L (ref 3.5–5.1)
Potassium: 3.7 mmol/L (ref 3.5–5.1)
Sodium: 141 mmol/L (ref 135–145)
Sodium: 141 mmol/L (ref 135–145)
TCO2: 35 mmol/L — ABNORMAL HIGH (ref 22–32)
TCO2: 35 mmol/L — ABNORMAL HIGH (ref 22–32)
pCO2, Ven: 59.1 mmHg (ref 44.0–60.0)
pCO2, Ven: 60.6 mmHg — ABNORMAL HIGH (ref 44.0–60.0)
pH, Ven: 7.352 (ref 7.250–7.430)
pH, Ven: 7.342 (ref 7.250–7.430)
pO2, Ven: 44 mmHg (ref 32.0–45.0)
pO2, Ven: 44 mmHg (ref 32.0–45.0)

## 2020-10-14 LAB — CBC
HCT: 46.1 % — ABNORMAL HIGH (ref 36.0–46.0)
Hemoglobin: 14.6 g/dL (ref 12.0–15.0)
MCH: 30.2 pg (ref 26.0–34.0)
MCHC: 31.7 g/dL (ref 30.0–36.0)
MCV: 95.4 fL (ref 80.0–100.0)
Platelets: 311 10*3/uL (ref 150–400)
RBC: 4.83 MIL/uL (ref 3.87–5.11)
RDW: 13.8 % (ref 11.5–15.5)
WBC: 13.4 10*3/uL — ABNORMAL HIGH (ref 4.0–10.5)
nRBC: 0 % (ref 0.0–0.2)

## 2020-10-14 LAB — POCT I-STAT, CHEM 8
BUN: 7 mg/dL — ABNORMAL LOW (ref 8–23)
Calcium, Ion: 1.21 mmol/L (ref 1.15–1.40)
Chloride: 100 mmol/L (ref 98–111)
Creatinine, Ser: 0.4 mg/dL — ABNORMAL LOW (ref 0.44–1.00)
Glucose, Bld: 101 mg/dL — ABNORMAL HIGH (ref 70–99)
HCT: 43 % (ref 36.0–46.0)
Hemoglobin: 14.6 g/dL (ref 12.0–15.0)
Potassium: 3.7 mmol/L (ref 3.5–5.1)
Sodium: 140 mmol/L (ref 135–145)
TCO2: 31 mmol/L (ref 22–32)

## 2020-10-14 LAB — BASIC METABOLIC PANEL
Anion gap: 8 (ref 5–15)
BUN: 8 mg/dL (ref 8–23)
CO2: 31 mmol/L (ref 22–32)
Calcium: 8.9 mg/dL (ref 8.9–10.3)
Chloride: 99 mmol/L (ref 98–111)
Creatinine, Ser: 0.61 mg/dL (ref 0.44–1.00)
GFR, Estimated: >60 mL/min (ref >60)
Glucose, Bld: 97 mg/dL (ref 70–99)
Potassium: 4 mmol/L (ref 3.5–5.1)
Sodium: 138 mmol/L (ref 135–145)

## 2020-10-14 SURGERY — RIGHT/LEFT HEART CATH AND CORONARY ANGIOGRAPHY
Anesthesia: LOCAL

## 2020-10-14 MED ORDER — SODIUM CHLORIDE 0.9 % IV SOLN: INTRAVENOUS | Status: DC

## 2020-10-14 MED ORDER — METOPROLOL TARTRATE 5 MG/5ML IV SOLN
INTRAVENOUS | Status: AC
  Filled 2020-10-14: qty 5

## 2020-10-14 MED ORDER — LIDOCAINE HCL (PF) 1 % IJ SOLN
INTRAMUSCULAR | Status: AC
  Filled 2020-10-14: qty 30

## 2020-10-14 MED ORDER — SODIUM CHLORIDE 0.9 % WEIGHT BASED INFUSION: 1.0000 mL/kg/h | INTRAVENOUS | Status: DC

## 2020-10-14 MED ORDER — VERAPAMIL HCL 2.5 MG/ML IV SOLN
INTRAVENOUS | Status: DC | PRN
  Administered 2020-10-14: 10 mL via INTRA_ARTERIAL

## 2020-10-14 MED ORDER — LIDOCAINE HCL (PF) 1 % IJ SOLN
INTRAMUSCULAR | Status: DC | PRN
  Administered 2020-10-14 (×2): 2 mL

## 2020-10-14 MED ORDER — ACETAMINOPHEN 325 MG PO TABS: 650.0000 mg | ORAL_TABLET | ORAL | Status: DC | PRN

## 2020-10-14 MED ORDER — FENTANYL CITRATE (PF) 100 MCG/2ML IJ SOLN
INTRAMUSCULAR | Status: DC | PRN
  Administered 2020-10-14 (×2): 25 ug via INTRAVENOUS

## 2020-10-14 MED ORDER — SODIUM CHLORIDE 0.9% FLUSH: 3.0000 mL | Freq: Two times a day (BID) | INTRAVENOUS | Status: DC

## 2020-10-14 MED ORDER — HEPARIN (PORCINE) IN NACL 1000-0.9 UT/500ML-% IV SOLN
INTRAVENOUS | Status: DC | PRN
  Administered 2020-10-14 (×2): 500 mL

## 2020-10-14 MED ORDER — LABETALOL HCL 5 MG/ML IV SOLN: 10.0000 mg | INTRAVENOUS | Status: DC | PRN

## 2020-10-14 MED ORDER — HEPARIN SODIUM (PORCINE) 1000 UNIT/ML IJ SOLN
INTRAMUSCULAR | Status: DC | PRN
  Administered 2020-10-14: 4500 [IU] via INTRAVENOUS

## 2020-10-14 MED ORDER — SODIUM CHLORIDE 0.9 % IV SOLN: 250.0000 mL | INTRAVENOUS | Status: DC | PRN

## 2020-10-14 MED ORDER — VERAPAMIL HCL 2.5 MG/ML IV SOLN
INTRAVENOUS | Status: AC
  Filled 2020-10-14: qty 2

## 2020-10-14 MED ORDER — FENTANYL CITRATE (PF) 100 MCG/2ML IJ SOLN
INTRAMUSCULAR | Status: AC
  Filled 2020-10-14: qty 2

## 2020-10-14 MED ORDER — ONDANSETRON HCL 4 MG/2ML IJ SOLN: 4.0000 mg | Freq: Four times a day (QID) | INTRAMUSCULAR | Status: DC | PRN

## 2020-10-14 MED ORDER — MIDAZOLAM HCL 2 MG/2ML IJ SOLN
INTRAMUSCULAR | Status: DC | PRN
  Administered 2020-10-14 (×2): 1 mg via INTRAVENOUS

## 2020-10-14 MED ORDER — HEPARIN (PORCINE) IN NACL 1000-0.9 UT/500ML-% IV SOLN
INTRAVENOUS | Status: AC
  Filled 2020-10-14: qty 1500

## 2020-10-14 MED ORDER — SODIUM CHLORIDE 0.9% FLUSH: 3.0000 mL | INTRAVENOUS | Status: DC | PRN

## 2020-10-14 MED ORDER — METOPROLOL TARTRATE 5 MG/5ML IV SOLN
INTRAVENOUS | Status: DC | PRN
  Administered 2020-10-14: 2.5 mg via INTRAVENOUS

## 2020-10-14 MED ORDER — HEPARIN SODIUM (PORCINE) 1000 UNIT/ML IJ SOLN
INTRAMUSCULAR | Status: AC
  Filled 2020-10-14: qty 1

## 2020-10-14 MED ORDER — ASPIRIN 81 MG PO CHEW: 81.0000 mg | CHEWABLE_TABLET | ORAL | Status: DC

## 2020-10-14 MED ORDER — IOHEXOL 350 MG/ML SOLN
INTRAVENOUS | Status: DC | PRN
  Administered 2020-10-14: 35 mL via INTRA_ARTERIAL

## 2020-10-14 MED ORDER — HYDRALAZINE HCL 20 MG/ML IJ SOLN: 10.0000 mg | INTRAMUSCULAR | Status: DC | PRN

## 2020-10-14 MED ORDER — MIDAZOLAM HCL 2 MG/2ML IJ SOLN
INTRAMUSCULAR | Status: AC
  Filled 2020-10-14: qty 2

## 2020-10-14 SURGICAL SUPPLY — 10 items
CATH 5FR JL3.5 JR4 ANG PIG MP (CATHETERS) ×1 IMPLANT
CATH BALLN WEDGE 5F 110CM (CATHETERS) ×1 IMPLANT
DEVICE RAD COMP TR BAND LRG (VASCULAR PRODUCTS) ×1 IMPLANT
GLIDESHEATH SLEND SS 6F .021 (SHEATH) ×1 IMPLANT
GUIDEWIRE INQWIRE 1.5J.035X260 (WIRE) IMPLANT
INQWIRE 1.5J .035X260CM (WIRE) ×2
KIT HEART LEFT (KITS) ×2 IMPLANT
PACK CARDIAC CATHETERIZATION (CUSTOM PROCEDURE TRAY) ×2 IMPLANT
SHEATH GLIDE SLENDER 4/5FR (SHEATH) ×1 IMPLANT
TRANSDUCER W/STOPCOCK (MISCELLANEOUS) ×2 IMPLANT

## 2020-10-14 NOTE — Discharge Instructions (Signed)
Radial Site Care  This sheet gives you information about how to care for yourself after your procedure. Your health care provider may also give you more specific instructions. If you have problems or questions, contact your health care provider. What can I expect after the procedure? After the procedure, it is common to have:  Bruising and tenderness at the catheter insertion area. Follow these instructions at home: Medicines  Take over-the-counter and prescription medicines only as told by your health care provider. Insertion site care  Follow instructions from your health care provider about how to take care of your insertion site. Make sure you: ? Wash your hands with soap and water before you change your bandage (dressing). If soap and water are not available, use hand sanitizer. ? Change your dressing as told by your health care provider. ? Leave stitches (sutures), skin glue, or adhesive strips in place. These skin closures may need to stay in place for 2 weeks or longer. If adhesive strip edges start to loosen and curl up, you may trim the loose edges. Do not remove adhesive strips completely unless your health care provider tells you to do that.  Check your insertion site every day for signs of infection. Check for: ? Redness, swelling, or pain. ? Fluid or blood. ? Pus or a bad smell. ? Warmth.  Do not take baths, swim, or use a hot tub until your health care provider approves.  You may shower 24-48 hours after the procedure, or as directed by your health care provider. ? Remove the dressing and gently wash the site with plain soap and water. ? Pat the area dry with a clean towel. ? Do not rub the site. That could cause bleeding.  Do not apply powder or lotion to the site. Activity  For 24 hours after the procedure, or as directed by your health care provider: ? Do not flex or bend the affected arm. ? Do not push or pull heavy objects with the affected arm. ? Do not drive  yourself home from the hospital or clinic. You may drive 24 hours after the procedure unless your health care provider tells you not to. ? Do not operate machinery or power tools.  Do not lift anything that is heavier than 10 lb (4.5 kg), or the limit that you are told, until your health care provider says that it is safe.  Ask your health care provider when it is okay to: ? Return to work or school. ? Resume usual physical activities or sports. ? Resume sexual activity.   General instructions  If the catheter site starts to bleed, raise your arm and put firm pressure on the site. If the bleeding does not stop, get help right away. This is a medical emergency.  If you went home on the same day as your procedure, a responsible adult should be with you for the first 24 hours after you arrive home.  Keep all follow-up visits as told by your health care provider. This is important. Contact a health care provider if:  You have a fever.  You have redness, swelling, or yellow drainage around your insertion site. Get help right away if:  You have unusual pain at the radial site.  The catheter insertion area swells very fast.  The insertion area is bleeding, and the bleeding does not stop when you hold steady pressure on the area.  Your arm or hand becomes pale, cool, tingly, or numb. These symptoms may represent a serious  problem that is an emergency. Do not wait to see if the symptoms will go away. Get medical help right away. Call your local emergency services (911 in the [place] Do not drive yourself to the hospital. Summary  After the procedure, it is common to have bruising and tenderness at the site.  Follow instructions from your health care provider about how to take care of your radial site wound. Check the wound every day for signs of infection.  Do not lift anything that is heavier than 10 lb (4.5 kg), or the limit that you are told, until your health care provider says that it  is safe. This information is not intended to replace advice given to you by your health care provider. Make sure you discuss any questions you have with your health care provider. Document Revised: 06/28/2017 Document Reviewed: 06/28/2017 Elsevier Patient Education  2021 [employer].

## 2020-10-14 NOTE — Interval H&P Note (Signed)
History and Physical Interval Note:  10/14/2020 12:13 PM  [name]  has presented today for surgery, with the diagnosis of heart failure.  The various methods of treatment have been discussed with the patient and family. After consideration of risks, benefits and other options for treatment, the patient has consented to  Procedure(s): RIGHT/LEFT HEART CATH AND CORONARY ANGIOGRAPHY (N/A) as a surgical intervention.  The patient's history has been reviewed, patient examined, no change in status, stable for surgery.  I have reviewed the patient's chart and labs.  Questions were answered to the patient's satisfaction.     Jarrin Staley [employer]

## 2020-10-15 ENCOUNTER — Encounter (HOSPITAL_COMMUNITY): Payer: Self-pay | Admitting: Cardiology

## 2020-10-15 MED FILL — Lidocaine HCl Local Preservative Free (PF) Inj 1%: INTRAMUSCULAR | Qty: 30 | Status: AC

## 2020-10-15 MED FILL — Heparin Sod (Porcine)-NaCl IV Soln 1000 Unit/500ML-0.9%: INTRAVENOUS | Qty: 500 | Status: AC

## 2020-10-15 MED FILL — Verapamil HCl IV Soln 2.5 MG/ML: INTRAVENOUS | Qty: 2 | Status: AC

## 2020-10-22 ENCOUNTER — Other Ambulatory Visit: Payer: Self-pay | Admitting: Internal Medicine

## 2020-10-22 ENCOUNTER — Telehealth: Payer: Self-pay | Admitting: Internal Medicine

## 2020-10-22 NOTE — Telephone Encounter (Signed)
Called and spoke with patient who is asking about heart cath procdedure done last week and was wanting to go over her results.   Dr. Chase Caller please advise if you can give her the results or if she needs to call cardiology

## 2020-10-23 NOTE — Telephone Encounter (Signed)
I called and spoke with patient regarding Cath results and MR recs. She verbalized understanding and we made an appt with MR for a televisit on 10/27/20. Patient verbalized understanding, nothing further needed.

## 2020-10-23 NOTE — Telephone Encounter (Signed)
Dr Loralie Champagne told me both Right and Left Heart cath normal. I recommend the PULSE inhaled Nitric oxide study if she is interested. I think she already got the consent for this but am copying [facility] [employer] in research so she is in the loop  Please also give first avail tele visit so I can go over the cath results and answer any other questions

## 2020-10-27 ENCOUNTER — Other Ambulatory Visit: Payer: Self-pay

## 2020-10-27 ENCOUNTER — Ambulatory Visit (INDEPENDENT_AMBULATORY_CARE_PROVIDER_SITE_OTHER): Payer: Medicare Other | Admitting: Internal Medicine

## 2020-10-27 DIAGNOSIS — J9611 Chronic respiratory failure with hypoxia: Secondary | ICD-10-CM

## 2020-10-27 DIAGNOSIS — J679 Hypersensitivity pneumonitis due to unspecified organic dust: Secondary | ICD-10-CM

## 2020-10-27 NOTE — Progress Notes (Signed)
OV 07/29/2019 -transfer of care to Dr. Chase Caller interstitial lung disease center.  Subjective:  Patient ID: [name], female , DOB: [birth year] , age 80 y.o. , MRN: [id number] , ADDRESS: [place] [id number]   07/29/2019 -   Chief Complaint  Patient presents with  . Follow-up    Pt states her breathing has become worse since last visit. States she also has an occ cough.   #Interstitial lung disease (progressive) with chronic hypoxemic respiratory failure - chronic HP dx -Per [facility] September 2016: Disease present at least since July 20, 2006  - Surgical lung biopsy  November 20, 2014 at [place]:   - local opinion: diagnostic of usual interstitial pneumonia (UIP). Interestingly, there are scattered non-necrotizing granulomata in the pleura.  -Second opinion September 2016 at [facility]: pathology was reviewed at our multidisciplinary ILD conference and noted to have a variegated pattern of fibrosis with some airway centricity, granulomas noted both near the airway and more peripherally. Minimal inflammation was noted. Features not c/w with UIP. Few giant cells with cholesterol clefts. Overall pattern most in keeping with chronic HP  -High-resolution CT chest including April 09, 2018: Lack of craniocaudal gradient and significant air trapping suggestive of chronic hypersensitivity pneumonitis [September 2014-07-20 [facility] evaluation thought CT was inconsistent with UIP]  -Clinical diagnosis of chronic hypersensitivity pneumonitis   - dx given Sept 2016 at [place]  -No clear-cut exposure identified at [facility] evaluation September 2016 by Dr. Dorothyann Peng  -Steroids recommended but patient preferred not to take it September 2016 at [place]   - deferred steroids sept 07-20-2014 visit at [facility] with Dr Sherri Sear  - nintedanib since March 2020   #Small hiatal hernia with acid reflux disease  HPI [name] 80 y.o. -is a transfer of care from Dr.  Lake Bells to Dr. Chase Caller with interstitial lung disease questionnaire.  She was last seen by Dr. Lake Bells in March 2020.  After the onset of the COVID-19 pandemic Dr. Lake Bells is now fully in charge of the Covid hospital [facility].  Therefore he is not in the outpatient medical practice anymore.  Therefore patient has been transferred to the ILD center to Dr. Chase Caller myself.  Is the first time I am meeting her and getting to know her.  She tells me that I took care of her husband for COPD a few years ago.  This was her ex-husband.  He passed away in Jul 21, 2011 after hospitalization to the ICU under our service.  She is grateful for the care.  She tells me that she has interstitial lung disease and for the last year she has been on nintedanib.  Currently she is having difficulty with nintedanib supply.  She only has 2 weeks left.  She uses 3 L of oxygen at rest.  She says she has been doing that for the last 4 years.  It is stable usage.  I am not really sure what her pulse ox on room air would be.  She says occasionally she would crank it up to 4 L for exertion but she generally uses 3 L both at rest and with exertion.  She says she tolerates nintedanib fine. Her interstitial lung disease history was reviewed and summarized above.  It appears that she was not keen on taking prednisone and her 07/20/14 visit with Dr. Dorothyann Peng at [facility].  Therefore once the [facility] data was available she was started on nintedanib a year ago.She  wants to have a son involved in her visits.  Because of the COVID-19 pandemic he is waiting outside in the car.  She says at the next visit she wants to bring him in.   Her symptom score is listed below.  Her progressive lung function decline is also listed below and the pulmonary function test reviewed.  I personally reviewed and visualized and interpreted those.  Her DLCO is paradoxically higher.  I think this is an error.   ROS - per HPI  OV  10/03/2019  Subjective:  Patient ID: [name], female , DOB: 1940-06-12 , age 29 y.o. 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 Interestingly, there are scattered non-necrotizing granulomata in the pleura.  -Second opinion September 2016 at [facility]: pathology was reviewed at our multidisciplinary ILD conference and noted to have a variegated pattern of fibrosis with some airway centricity, granulomas noted both near the airway and more peripherally. Minimal inflammation was noted. Features not c/w with UIP. Few giant cells with cholesterol clefts. Overall pattern most in keeping with chronic HP  -High-resolution CT chest including April 09, 2018: Lack of craniocaudal gradient and significant air trapping suggestive of chronic hypersensitivity pneumonitis [September 2016 [facility] evaluation thought CT was inconsistent with UIP]  -Clinical diagnosis of chronic hypersensitivity pneumonitis   - dx given Sept 2016 at [place]  -No clear-cut exposure identified at [facility] evaluation September 2016 by Dr. Dorothyann Peng  -Steroids recommended but patient preferred not to take it September 2016 at [place]   - deferred steroids sept 2016 visit at [facility] with Dr Sherri Sear  - nintedanib since March 2020   #Small hiatal hernia with acid reflux disease  10/03/2019 -   Chief Complaint  Patient presents with  . Follow-up    SOB and cough with activity has increasd, Productive cough with yellow sputum     HPI [name] 80 y.o. -returns for follow-up with her son.  At this point in time the focus is to establish the true nature of interstitial lung  disease.  She has brought her son with her.  However approximately 2 weeks ago she had acute bronchitis symptoms and called in.  Was given a cephalexin 5 days and prednisone 5 days.  These have helped immensely.  However she feels not back to baseline.  She feels she will benefit from another round of antibiotic with or without prednisone.  She finished ILD questionnaire and it is listed below.  We have not been able to discuss in a multidisciplinary case conference of 4. [place] Integrated Comprehensive ILD Questionnaire  Symptoms: See below.    SYMPTOM SCALE - ILD 07/29/2019  10/03/2019   O2 use 3L at rest, occ 4L with exertion 3L at rest and 4L with exertion. Acute bronchitis 2 weeks ago  Shortness of Breath 0 -> 5 scale with 5 being worst (score 6 If unable to do)   At rest 0 1  Simple tasks - showers, clothes change, eating, shaving 1 2  Household (dishes, doing bed, laundry) 2 3  Shopping 2 2  Walking level at own pace 1 2  Walking up Stairs 6 4  Total (30-36) Dyspnea  Score 12 14  How bad is your cough? 1 3.5  How bad is your fatigue 0 3.5  How bad is nausea 0 0  How bad is vomiting?  0 0  How bad is diarrhea? 0 0  How bad is anxiety? 1 1.5  How bad is depression 1 1     Past Medical History :  -Denies any collagen vascular disease or vasculitis.  Denies any knowledge of prior pulmonary hypertension.  Denies diabetes or thyroid disease.  Denies any stroke.  Denies mononucleosis denies tuberculosis denies pneumonia denies blood clots denies heart disease denies pleurisy   ROS: Positive for shoulder pain for the last several years but otherwise no dry eyes.  No Raynaud's.  No weight loss.  No nausea no vomiting no rash   FAMILY HISTORY of LUNG DISEASE: Denies any pulmonary fibrosis or COPD or cystic fibrosis of hypersensitive pneumonitis or any other lung disease.   EXPOSURE HISTORY: Never smoked cigarettes.  No passive smoking.  No marijuana.  No vaping no cocaine no  intravenous drug use.   HOME and HOBBY DETAILS : Current home is 25 years she is lived there for 5 years.  [place] there is no dampness.  No mold or mildew no feather pillow.  No steam iron use no Jacuzzi use no nebulizer use no humidifier use.  No pet birds or parakeets.  No pet gerbils no pet hamsters no pet rabbits no rodents.  No mold in the [facility] duct no music habits such as wind instruments no gardening.  No flood of water damage.  No strong mats.  No exposure to animals at work.   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  She also had a long-term tendency to clean her home heavily with Clorox.  She inhaled these fumes according to the son.  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   PULMONARY TOXICITY HISTORY (27 items): She has done prednisone burst.  She does not want to do long-term prednisone.  She has been on long-term nintedanib but tolerating it well.    Testing -2018 echo with elevated pulmonary systolic pressure 55 mmHg  - nov 2019 Last CT - HRCT IMPRESSION: 1. Spectrum of findings compatible with fibrotic interstitial lung disease with extensive air trapping and absence of basilar gradient, strongly favoring chronic hypersensitivity pneumonitis. Findings have slightly progressed since 04/24/2017, with more clear progression since more remote chest CT of 06/24/2014. Findings are suggestive of an alternative diagnosis (not UIP) per consensus guidelines: Diagnosis of Idiopathic Pulmonary Fibrosis: An Official ATS/ERS/JRS/ALAT Clinical Practice Guideline. [place], Iss 5, [phone], Feb 04 2017. 2. Stable mild mediastinal lymphadenopathy, most compatible with benign reactive adenopathy. 3. One vessel coronary atherosclerosis. 4.  Small hiatal hernia.  Aortic Atherosclerosis (ICD10-I70.0).     Results for NAKEA, GOUGER (MRN [id number]) as of 07/29/2019 11:43  Ref. Range 07/04/2014 12:58 09/03/2015 11:04 09/27/2016 13:56 05/01/2017 14:05 07/19/2019 15:54  FVC-Pre Latest Units: L 2.13 1.93 1.86 1.71 1.61  FVC-%Pred-Pre Latest Units: % 66 61 59 55 53   Results for ONISHA, CEDENO (MRN [id number]) as of 07/29/2019 11:43  Ref. Range 07/04/2014 12:58 09/03/2015 11:04 09/27/2016 13:56 05/01/2017 14:05 07/19/2019 15:54  DLCO unc Latest Units:  ml/min/mmHg 13.70 14.70 12.69 11.23 17.11  DLCO unc % pred Latest Units: % 48 51 44 39 82     ROS  OV 12/03/2019  Subjective:  Patient ID: [name], female , DOB: [birth year] , age 69 y.o. , MRN: [id number] , ADDRESS: [place] [address]   12/03/2019 -   Chief Complaint  Patient presents with  . Follow-up    cough is better but still present with exertion, breathing about the same since last visit    #Interstitial lung disease (progressive) with chronic hypoxemic respiratory failure - chronic HP dx -Per [facility] September 2016: Disease present at least since 2008  - Surgical lung biopsy  November 20, 2014 at [place]:   - local opinion: diagnostic of usual interstitial pneumonia (UIP). Interestingly, there are scattered non-necrotizing granulomata in the pleura.  -Second opinion September 2016 at [facility]: pathology was reviewed at our multidisciplinary ILD conference and noted to have a variegated pattern of fibrosis with some airway centricity, granulomas noted both near the airway and more peripherally. Minimal inflammation was noted. Features not c/w with UIP. Few giant cells with cholesterol clefts. Overall pattern most in keeping with chronic HP   - 3rd opinon: June 2021 [facility] ILD [place] - HP   -High-resolution CT chest including April 09, 2018: Lack of craniocaudal gradient and significant air trapping suggestive of  chronic hypersensitivity pneumonitis [September 2016 [facility] evaluation thought CT was inconsistent with UIP]  -Clinical diagnosis of chronic hypersensitivity pneumonitis   - dx given Sept 2016 at [place]  -No clear-cut exposure identified at [facility] evaluation September 2016 by Dr. Dorothyann Peng  -Steroids recommended but patient preferred not to take it September 2016 at [place]   - deferred steroids sept 2016 visit at [facility] with Dr Sherri Sear  - nintedanib since March 2020   #Small hiatal hernia with acid reflux disease   HPI [name] 80 y.o. -presents with her son for follow-up.  She continues to be on 3 L at rest 4 L with exertion.  Her acute bronchitis from April 2021 is resolved.  Her overall symptoms are stable/slightly better after her acute bronchitis.  There is no interim complaints.  In the interim we did discuss her at her case conference.  The consensus diagnosis is that she has hypersensitive pneumonitis.  The only exposure is when she was working in the [place] at in [place].  She and her son again denies any ongoing exposures.  Reviewed her pulmonary function test and it is stable compared to the recent ones.  There is progression over the last few years to several years.  In terms of a high-resolution CT chest is also stable since 2019 through 2021 but progressive when compared to earlier time points.  She was started on nintedanib in March 2020.  She is tolerating this quite well.  It appears that she and she went on nintedanib she is stable.  She yet to do pulmonary rehabilitation.  Last done few years ago.  Son feels that she needs to be more mobile She is yet to connect with the support group She did not get referred to cardiology for right heart catheterization. She wants a flutter valve for her associated bronchiectasis seen on CT scan    OV 03/09/2020   Subjective:  Patient ID: [name], female , DOB: [birth year], age 74 y.o. years. , MRN:  [id number],  ADDRESS: [place] [address] PCP  Lawerance Cruel, MD Providers : Treatment Team:  Attending Provider: Brand Males, MD   Chief Complaint  Patient presents with  . Follow-up    Patient wears 4 liters oxygen with exertion, was prescribed celebrex they want to know if it is ok with OFEV.      #Interstitial lung disease (progressive) with chronic hypoxemic respiratory failure - chronic HP dx -Per [facility] September 2016: Disease present at least since 2008  - Surgical lung biopsy  November 20, 2014 at [place]:   - local opinion: diagnostic of usual interstitial pneumonia (UIP). Interestingly, there are scattered non-necrotizing granulomata in the pleura.  -Second opinion September 2016 at [facility]: pathology was reviewed at our multidisciplinary ILD conference and noted to have a variegated pattern of fibrosis with some airway centricity, granulomas noted both near the airway and more peripherally. Minimal inflammation was noted. Features not c/w with UIP. Few giant cells with cholesterol clefts. Overall pattern most in keeping with chronic HP   - 3rd opinon: June 2021 [facility] [place] - Hypersensitivity Pneumonitis   -High-resolution CT chest including April 09, 2018: Lack of craniocaudal gradient and significant air trapping suggestive of chronic hypersensitivity pneumonitis [September 2016 [facility] evaluation thought CT was inconsistent with UIP]  -Clinical diagnosis of chronic hypersensitivity pneumonitis   - dx given Sept 2016 at [place]  -No clear-cut exposure identified at [facility] evaluation September 2016 by Dr. Dorothyann Peng  -Steroids recommended but patient preferred not to take it September 2016 at [place]   - deferred steroids sept 2016 visit at [facility] with Dr Dorothyann Peng and again at [facility] in June 2021  - nintedanib sole Rx since March 2020     HPI [name] 80 y.o. -returns for follow-up with  her son Simona Huh.  Since her last visit in June 2020 when she continues to be stable.  She is enrolled in the pulmonary rehabilitation program which has helped her fatigue and shortness of breath significantly.  She continues on nintedanib without any adverse side effects.  She wanted to know if it is okay to take cephalexin for arthralgia.  She has chronic osteoarthritis.  She wants to avoid NSAIDs.  In terms of oxygen use she is stable at 4 L nasal cannula.  She recently had pulmonary function test I reviewed this and it shows stability and is documented below.  Her weight continues to be stable.  We referred her for right heart catheterization with because of the Covid delta surge she deferred it.  Of note on February 28, 2020 while trying to get into the car out of her primary care physician's office she fell down and sustained abrasions and bruises injuries particularly in her lower extremity.  Since then her right kneecap patella area is red and warm.  There is a small incision injury there but this is healed.  She thinks it slightly better/stable.  She is not having any fever.  This has not been treated.      ROS - per HPI  CT chest high resolution - may 2021  IMPRESSION: 1. Spectrum of findings compatible with fibrotic interstitial lung disease with moderate air trapping and absence of apicobasilar gradient. Findings are most compatible with chronic hypersensitivity pneumonitis. No interval progression since 2019 chest CT. Evidence of progression since baseline 2016 chest CT. Findings are suggestive of an alternative diagnosis (not UIP) per consensus guidelines: Diagnosis of Idiopathic Pulmonary Fibrosis: An Official ATS/ERS/JRS/ALAT Clinical Practice Guideline. [place]  Med Vol 198, Iss 5, [id number], Feb 04 2017. 2. Stable mild mediastinal lymphadenopathy, compatible with benign reactive adenopathy. 3. Small hiatal hernia. 4. Aortic Atherosclerosis  (ICD10-I70.0).   Electronically Signed   By: Ilona Sorrel M.D.   On: 10/24/2019 15:02    OV 09/24/2020  Subjective:  Patient ID: [name], female , DOB: 1941/04/11 , age 38 y.o. , MRN: [id number] , ADDRESS: [place] [id number] PCP Lawerance Cruel, MD Patient Care Team: Lawerance Cruel, MD as PCP - General (Family Medicine) Tamsen Roers, MD as Referring Physician (Family Medicine) Melrose Nakayama, MD as Consulting Physician (Cardiothoracic Surgery)  This Provider for this visit: Treatment Team:  Attending Provider: Brand Males, MD    09/24/2020 -   Chief Complaint  Patient presents with  . Follow-up    Review PFT results. Still reports cough, sometimes productive with clear phlegm.      #Interstitial lung disease (progressive) with chronic hypoxemic respiratory failure - chronic HP dx -Per [facility] September 2016: Disease present at least since 2008  - Surgical lung biopsy  November 20, 2014 at [place]:   - local opinion: diagnostic of usual interstitial pneumonia (UIP). Interestingly, there are scattered non-necrotizing granulomata in the pleura.  -Second opinion September 2016 at [facility]: pathology was reviewed at our multidisciplinary ILD conference and noted to have a variegated pattern of fibrosis with some airway centricity, granulomas noted both near the airway and more peripherally. Minimal inflammation was noted. Features not c/w with UIP. Few giant cells with cholesterol clefts. Overall pattern most in keeping with chronic HP   - 3rd opinon: June 2021 [facility] [place] - Hypersensitivity Pneumonitis   -High-resolution CT chest including April 09, 2018: Lack of craniocaudal gradient and significant air trapping suggestive of chronic hypersensitivity pneumonitis [September 2016 [facility] evaluation thought CT was inconsistent with UIP]  - May 2021: stability since 2019 but progression sice  2016  -Clinical diagnosis of chronic hypersensitivity pneumonitis   - dx given Sept 2016 at [place]  -No clear-cut exposure identified at [facility] evaluation September 2016 by Dr. Dorothyann Peng  -Steroids recommended but patient preferred not to take it September 2016 at [place]   - deferred steroids sept 2016 visit at [facility] with Dr Dorothyann Peng and again at [facility] in June 2021, Apr 2022  - nintedanib sole Rx since March 2020       HPI [name] 80 y.o. -presents for follow-up.  Last seen in the fall 2021.  After that overall she is been stable but last month she picked up a respiratory infection got antibiotic and prednisone and then she is better.  Nevertheless she thinks her symptoms may be a little worse than baseline.  Still she says she gets by with 3-4 L of oxygen which is her baseline.  Subjective symptom score overall has not changed.  She had pulmonary function test today and this shows a decline it is all documented below.  I visualized this.  Her last CT scan of the chest was in May 2021.  1 year anniversary of the CT scan is coming up.  Over the winter she declined to have right heart catheterization because of COVID-19.  But at this point in time with the improvement in the pandemic prevalence rate in the local community of COVID she is willing to have a right heart catheterization.  She is tolerating nintedanib just fine without any problems.  A walking desaturation  test on room air is as below      OV 10/27/2020  Subjective:  Patient ID: [name], female , DOB: 1940/06/26 , age 44 y.o. , MRN: [id number] , ADDRESS: [place] [address] PCP Lawerance Cruel, MD Patient Care Team: Lawerance Cruel, MD as PCP - General (Family Medicine) Larey Dresser, MD as PCP - Advanced Heart Failure (Cardiology) Tamsen Roers, MD as Referring Physician (Family Medicine) Melrose Nakayama, MD as Consulting Physician  (Cardiothoracic Surgery)  This Provider for this visit: Treatment Team:  Attending Provider: Brand Males, MD   #Interstitial lung disease (progressive) with chronic hypoxemic respiratory failure - chronic HP dx -Per [facility] September 2016: Disease present at least since 2008  - Surgical lung biopsy  November 20, 2014 at [place]:   - local opinion: diagnostic of usual interstitial pneumonia (UIP). Interestingly, there are scattered non-necrotizing granulomata in the pleura.  -Second opinion September 2016 at [facility]: pathology was reviewed at our multidisciplinary ILD conference and noted to have a variegated pattern of fibrosis with some airway centricity, granulomas noted both near the airway and more peripherally. Minimal inflammation was noted. Features not c/w with UIP. Few giant cells with cholesterol clefts. Overall pattern most in keeping with chronic HP   - 3rd opinon: June 2021 [facility] [place] - Hypersensitivity Pneumonitis   -High-resolution CT chest including April 09, 2018: Lack of craniocaudal gradient and significant air trapping suggestive of chronic hypersensitivity pneumonitis [September 2016 [facility] evaluation thought CT was inconsistent with UIP]  - May 2021: stability since 2019 but progression sice 2016  -Clinical diagnosis of chronic hypersensitivity pneumonitis   - dx given Sept 2016 at [place]  -No clear-cut exposure identified at [facility] evaluation September 2016 by Dr. Dorothyann Peng  -Steroids recommended but patient preferred not to take it September 2016 at [place]   - deferred steroids sept 2016 visit at [facility] with Dr Dorothyann Peng and again at [facility] in June 2021, Apr 2022  - nintedanib sole Rx since March 2020    10/27/2020 -  TELEPHONE CALL  Type of visit: Telephone/Video Circumstance: COVID-19 national emergency Identification of patient [name] with [birth year] and MRN [id number] - 2 person identifier Risks:  Risks, benefits, limitations of telephone visit explained. Patient understood and verbalized agreement to proceed Anyone else on call:  Patient location: [phone] This provider location: [address] [facility], [place], [place]   HPI [name] 80 y.o. -this telephone visit is for a few reasons  1)  grandson getting married in July in [place] and [facility] is elevated. Wants to go. Son Denies thiniong of taking her to [facility]. It is 4000 feet + /1300 meters. Advised them Dom [place] is safeer from elevation  2) discussed heart catheterization results which are essentially normal.  3) discussed high-resolution CT chest because of concern of progression but in the last 1 year her ILD is stable on nintedanib.  She is happy about this  4) discuss future therapeutic options: At this point in time she is maxed out on therapy.  Not interested in prednisone.  She is on antifibrotic nintedanib.  We discussed the pulse inhaled nitric oxide study phase 3.  She is very interested in this.  We discussed about the device and also convenience and buying her oxygen backpack called [employer].  I believe she has a copy of the consent for the study.  We  discussed the principles of this.  She is very interested.  She wants to hear from the research coordinator.  I have sent the research coordinator Lazaro Arms message.  Advised may be a few weeks.   Heart Cath 10/14/20  1. Minimally elevated PA pressure with PVR only 2.1.  2. Normal filling pressures.  3. No significant coronary disease.  RHC Procedural Findings: Hemodynamics (mmHg) RA mean 4 RV 35/3 PA 36/8, mean 18 PCWP mean 6 LV 165/12 AO 161/69  Oxygen saturations: PA 76% AO 100%  Cardiac Output (Fick) 5.73  Cardiac Index (Fick) 2.69  PVR 2.1  SYMPTOM SCALE - ILD 07/29/2019  10/03/2019  12/03/2019  03/09/2020  09/24/2020   O2 use 3L at rest, occ 4L with exertion 3L at rest and 4L with exertion. Acute bronchitis 2  weeks ago 3L rest, 4 L exertion 3-4L [place] On ofev. Doing rehab 3/4L [place] pulse . On ofev  Shortness of Breath 0 -> 5 scale with 5 being worst (score 6 If unable to do)      At rest 0 1 0 0 0  Simple tasks - showers, clothes change, eating, shaving _0 Household (dishes, doing bed, laundry) _1 2.5  Shopping _2 2.5  Walking level at own pace _3 Walking up Stairs _4 Total (30-36) Dyspnea Score _5 How bad is your cough? 1 3.5  More often lateley 2  How bad is your fatigue 0 3.5 2 Better with exercise 2.5  How bad is nausea 0 0 0 0 0  How bad is vomiting?  0 0 0 0 0  How bad is diarrhea? 0 0 0 0 0  How bad is anxiety? 1 1._6 How bad is depression _7 0    Simple office walk 185 feet x  3 laps goal with forehead probe 09/24/2020   O2 used ra (baseline uses 3-4L at home)  Number laps completed End of 1 lap out of intented 3  Comments about pace slow  Resting Pulse Ox/HR 94% and 87/min  Final Pulse Ox/HR 85% and 113/min  Desaturated </= 88% yes  Desaturated <= 3% points yes  Got Tachycardic >/= 90/min yes  Symptoms at end of test Mod dyspnea  Miscellaneous comments desat at end iof 1 lap. STable on room air       PFT  PFT Results Latest Ref Rng & Units 09/24/2020 12/03/2019 12/03/2019 07/19/2019 05/01/2017 09/27/2016 09/03/2015  FVC-Pre L 1.52 1.70 1.66 1.61 1.71 1.86 1.93  FVC-Predicted Pre % 50 57 55 53 55 59 61  FVC-Post L - - - 1.87 1.72 1.91 2.03  FVC-Predicted Post % - - - 61 55 61 64  Pre FEV1/FVC % % 80 81 82 81 87 86 87  Post FEV1/FCV % % - - - 78 90 89 88  FEV1-Pre L 1.21 1.39 1.36 1.31 1.50 1.60 1.68  FEV1-Predicted Pre % 54 62 60 57 64 68 70  FEV1-Post L - - - 1.46 1.56 1.70 1.78  DLCO uncorrected ml/min/mmHg 5.95 15.25 13.20 17.11 11.23 12.69 14.70  DLCO UNC% % 28 73 63 82 39 44 51  DLCO corrected ml/min/mmHg 5.95 15.25 13.20 - 10.63 12.07 14.09  DLCO COR %Predicted % 28 73 63 - 37 42 49  DLVA Predicted % 67 114  103 120 70 80 78  TLC L - - - 3.63 3.37 3.49 -  TLC % Predicted % - - - 65 61 63 -  RV % Predicted % - - - 73 63 64 -   HRCT 09/29/20   COMPARISON:  Chest CT 10/24/2019.  IMPRESSION: 1. 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 2. Aortic atherosclerosis, in addition to 3 vessel coronary artery disease. 3. There are calcifications of the aortic valve and mitral annulus. Echocardiographic correlation for evaluation of potential valvular dysfunction may be warranted if clinically indicated.  Aortic Atherosclerosis (ICD10-I70.0) and Emphysema (ICD10-J43.9).   Electronically Signed   By: Vinnie Langton M.D.   On: 09/30/2020 06:45     has a past medical history of Allergic rhinitis, Anxiety, Arthritis, Asthma, Carpal tunnel syndrome, CHF (congestive heart failure) ([place]), GERD (gastroesophageal reflux disease), History of bronchitis (3-63yr ago), History of colon polyps, Joint pain, Peripheral edema, Pneumonitis, hypersensitivity ([place] (05/2016), Pulmonary hypertension ([place] (06/30/2017), Shortness of breath dyspnea, and Thyroid nodule.   reports that she has never smoked. She has never used smokeless tobacco.  Past Surgical History:  Procedure Laterality Date  . BREAST CYST EXCISION Left 50 yrs ago  . CARPELL TUNNELL SURG Right   . COLONOSCOPY    . ESOPHAGOGASTRODUODENOSCOPY    . EYE SURGERY     bilateral cataract surgery with lens implants  . KNEE ARTHROSCOPY W/ MENISCAL REPAIR     left  . LUNG BIOPSY Right 11/20/2014   Procedure: LUNG BIOPSY;  Surgeon: SMelrose Nakayama MD;  [facility]  Service: Thoracic;  Laterality: Right;  . REVERSE SHOULDER ARTHROPLASTY Right 06/07/2018   Procedure: REVERSE SHOULDER  ARTHROPLASTY;  Surgeon: SJustice Britain MD;  [facility];  Service: Orthopedics;  Laterality: Right;  1239m  . RIGHT/LEFT HEART CATH AND CORONARY ANGIOGRAPHY N/A 10/14/2020   Procedure: RIGHT/LEFT HEART CATH AND CORONARY ANGIOGRAPHY;  Surgeon: McLarey DresserMD;  [facility];  Service: Cardiovascular;  Laterality: N/A;  . TOTAL KNEE ARTHROPLASTY Left 08/13/2013   Procedure: LEFT TOTAL KNEE ARTHROPLASTY;  Surgeon: MaMauri PoleMD;  [facility];  Service: Orthopedics;  Laterality: Left;  . UMBILICAL HERNIA REPAIR    . VIDEO ASSISTED THORACOSCOPY Right 11/20/2014   Procedure: RIGHT  VIDEO ASSISTED THORACOSCOPY WITH WEDGE LUNG BIOPSIES RIGHT UPPER,MIDDLE AND LOWER LOBES ,PLACEMENT OF ON Q PAIN PUMP;  Surgeon: StMelrose NakayamaMD;  [facility] Service: Thoracic;  Laterality: Right;    Allergies  Allergen Reactions  . Adhesive [Tape] Itching and Rash  . Latex Itching and Swelling  . Doxycycline Rash  . Sulfonamide Derivatives Other (See Comments)    Unknown    Immunization History  Administered Date(s) Administered  . Fluad Quad(high Dose 65+) 02/07/2019  . Influenza, High Dose Seasonal PF 04/20/2016, 02/23/2017, 02/12/2018  . Influenza-Unspecified 03/06/2014, 02/18/2015  . PFIZER(Purple Top)SARS-COV-2 Vaccination 07/12/2019, 08/06/2019, 04/12/2020  . Pneumococcal Conjugate-13 03/06/2014  . Tdap 06/14/2017    Family History  Problem Relation Age of Onset  . Tuberculosis Father   . Tuberculosis Paternal Uncle      Current Outpatient Medications:  .  albuterol (VENTOLIN HFA) 108 (90 Base) MCG/ACT inhaler, Inhale 2 puffs into the lungs every 6 (six) hours as needed for wheezing or shortness of breath., Disp: 18 g, Rfl:  6 .  aspirin 325 MG tablet, Take 650 mg by mouth daily as needed for moderate pain or headache., Disp: , Rfl:  .  Cholecalciferol (DIALYVITE VITAMIN D 5000) 125 MCG (5000 UT) capsule, Take 5,000 Units by mouth daily., Disp: , Rfl:   .  fluticasone (FLONASE) 50 MCG/ACT nasal spray, Place 1 spray into both nostrils daily as needed for allergies. , Disp: , Rfl:  .  ibuprofen (ADVIL) 200 MG tablet, Take 200-400 mg by mouth every 6 (six) hours as needed for headache or moderate pain., Disp: , Rfl:  .  loratadine (CLARITIN) 5 MG chewable tablet, Chew 5-10 mg by mouth daily as needed for allergies. , Disp: , Rfl:  .  Multiple Vitamin (MULTIVITAMIN WITH MINERALS) TABS tablet, Take 1 tablet by mouth daily., Disp: , Rfl:  .  OFEV 150 MG CAPS, TAKE 1 CAPSULE (150 MG TOTAL) BY MOUTH TWO TIMES DAILY., Disp: 60 capsule, Rfl: 5 .  Respiratory Therapy Supplies (FLUTTER) DEVI, Use as directed., Disp: 1 each, Rfl: 0 .  sodium chloride HYPERTONIC 3 % nebulizer solution, Take 4 mLs by nebulization daily. (Patient taking differently: Take 4 mLs by nebulization in the morning and at bedtime.), Disp: 480 mL, Rfl: 0 .  vitamin B-12 (CYANOCOBALAMIN) 1000 MCG tablet, Take 1,000 mcg by mouth every other day., Disp: , Rfl:       Objective:   There were no vitals filed for this visit.  Estimated body mass index is 32.42 kg/m as calculated from the following:   Height as of 10/14/20: 5' 7" (1.702 m).   Weight as of 10/14/20: 207 lb (93.9 kg).  _0 @  There were no vitals filed for this visit.   Physical Exam Telephone visit  Sounded normal on the phone.  Son Simona Huh joined her. Assessment:       ICD-10-CM   1. 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 Highly concerned moe progressive in April 2022 compared to 2021 after  respiratory infeciton in [facility] 2022  - Understood/noted baseline reluctance towards prednisone  Plan   -Continue nintedanib as before  - Check LFT 09/24/2020 -Re-refer cardiology (Dr Loralie Champagne or Dr Pierre Bali or Dr Adrian Prows) for right heart cath asap --Continue involvement with the support group through [place] consent for ILD-pro registry study   - Research staff will prescreen you and will contact you if you are eligible = Do HRCT supine/prone next few days to few weeks    Follow-up --next few to several weeks but after completing CT and heart cath  - basedon results - discussion on daily prednisone, versus teprostinil, versus PULSE study  (Telephone visit - Level 03 visit: Estb 21-30 for this visit type which was visit type: telephone visit in total care time and counseling or/and coordination of care by this undersigned MD - Dr Brand Males. This includes one or more of the following for care delivered on 10/27/2020 same day: pre-charting, chart review, note writing, documentation discussion of test results, diagnostic or treatment recommendations, prognosis, risks and benefits of management options, instructions, education,  compliance or risk-factor reduction. It excludes time spent by the [place] or office staff in the care of the patient. Actual time was 21 min. E&M code is [phone])    SIGNATURE    Dr. Brand Males, M.D., F.C.C.P,  Pulmonary and Critical Care Medicine Staff Physician, [place] Director - Interstitial Lung Disease  Program  Pulmonary [place] [place], [place], [address]  Pager: [phone], If no answer or between  15:00h - 7:00h: call [phone] Telephone: [phone]  11:43 AM 10/27/2020

## 2020-10-27 NOTE — Patient Instructions (Addendum)
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  STable on CT April 2022 compared to May 2021. Heart (Right and Left) are normal May 2022  - Understood/noted baseline reluctance towards prednisone  Plan   -Continue nintedanib as before - is helping  - -Continue involvement with the support group through [name]  - Discussed participation in ILD PRo registry last time  - this visit 10/27/2020 discussed participation in [place]  - appreciate your interest in both - Travel advise: safer from lung health to go [place] than elevation in [place]     Follow-up --awaoit call from [employer] [facility]  - spiro/dlco in 4 months for standard of care  - return [name] 30 min in 4 months or sooner if needed

## 2020-11-17 ENCOUNTER — Telehealth: Payer: Self-pay | Admitting: Internal Medicine

## 2020-11-17 ENCOUNTER — Encounter (INDEPENDENT_AMBULATORY_CARE_PROVIDER_SITE_OTHER): Payer: Medicare Other | Admitting: Internal Medicine

## 2020-11-17 ENCOUNTER — Encounter: Payer: Medicare Other | Admitting: *Deleted

## 2020-11-17 ENCOUNTER — Other Ambulatory Visit: Payer: Self-pay | Admitting: Internal Medicine

## 2020-11-17 DIAGNOSIS — Z006 Encounter for examination for normal comparison and control in clinical research program: Secondary | ICD-10-CM

## 2020-11-17 DIAGNOSIS — J479 Bronchiectasis, uncomplicated: Secondary | ICD-10-CM

## 2020-11-17 DIAGNOSIS — J849 Interstitial pulmonary disease, unspecified: Secondary | ICD-10-CM

## 2020-11-17 MED ORDER — AZITHROMYCIN 250 MG PO TABS: 250.0000 mg | ORAL_TABLET | Freq: Every day | ORAL | 0 refills | Status: DC

## 2020-11-17 MED ORDER — PREDNISONE 10 MG PO TABS: 10.0000 mg | ORAL_TABLET | Freq: Every day | ORAL | 0 refills | Status: DC

## 2020-11-17 NOTE — Research (Signed)
Title:  A Randomized double-blind placebo-controlled dose escalation and verification clinical study to assess the safety and efficacy of pulsed, inhaled nitric oxide (iNO) in subjects at risk of pulmonary hypertension associated with pulmonary fibrosis on long term oxygen therapy (Part 1 and Part 2). Primary Endpoint: The placebo-corrected change for INOpulse in minutes of moderate to vigorous physical activity (MVPA) measured by actigraphy from baseline to month 4. Duration of treatment: Study participants will receive iNO 45 mcg/kg IBW/hr versus placebo for 4 months (16 weeks) during Part 1.  Part 2: Open Label Extension (OLE) Study participants will be offered open label therapy at iNO 45 mcg/kg IBW/hr after completing the Part 1. Protocol #: PULSE-PHPF-001 (REBUILD), ClinicalTrials.gov Identifier: [id number], **Sponsor is [employer], [place], [place] [address]) Protocol Version Amendment 7 dated 20_12_2021 Consent Version for 11/17/2020 date cohort 3 approved 02Dec2021 Investigator Brochure Version 11.0 approved 20 Jan 2020  Clinical Research Coordinator / Research RN note : This visit for Subject [name] with DOB: 01/15/1941 on 11/17/2020 for the above protocol is Visit/Encounter #1 and is for purpose of research.   Subject expressed continued interest and consent in continuing as a study subject. Subject confirmed that there was no change in contact information (e.g. address, telephone, email). Subject thanked for participation in research and contribution to science.  In this visit 11/17/2020 the subject will be evaluated by investigator named Dr. Brand Males. This research coordinator has verified that the investigator is uptodate with his/her training logs  Subject came in on 11/17/2020 for Screening visit for [place] REBUILD study. PI Dr. Brand Males completed the consent process with [facility] Lazaro Arms. Dr. Chase Caller also completed the physical exam.  The  subject reported increased chest congestion with a productive cough producing yellow phlegm, wheezing and chest tightness, all worse x2 weeks. Subject denied any increase in baseline SOB. Dr. Chase Caller called the subject in a Prednisone Taper and Azithromycin. Subject completed all procedures per the above listed protocol except the DLCO testing and the 6MWT. Once the subject's symptoms have resolved, an appointment will be scheduled to complete the DLCO and 6MWT.   Signed by Lazaro Arms Clinical Research Coordinator  [facility]  [place], [place] 12:07 PM 11/17/2020

## 2020-11-17 NOTE — Telephone Encounter (Signed)
I called and spoke with patient regarding MR recs. Patient verbalized understanding and I have sent in prednisone and Zpak to preferred pharmacy. Patient will get PCR covid test at [facility] when getting meds and let us know what it is. Nothing further needed.

## 2020-11-17 NOTE — Telephone Encounter (Signed)
ATC, left VM for callback

## 2020-11-17 NOTE — Telephone Encounter (Signed)
I called and spoke with [name] at [facility] and verified Zpak directions. Nothing further needed.

## 2020-11-17 NOTE — Telephone Encounter (Signed)
Her covid antigent test was negative and no known covid exposures She has ild  Plan - recommend covid pcr test any location  - Z pak  - Please take prednisone 40 mg x1 day, then 30 mg x1 day, then 20 mg x1 day, then 10 mg x1 day, and then 5 mg x1 day and stop (she has issues with prednisone so pls make sure short taper is ok and is why I have advised only short taper)    Allergies  Allergen Reactions   Adhesive [Tape] Itching and Rash   Latex Itching and Swelling   Doxycycline Rash   Sulfonamide Derivatives Other (See Comments)    Unknown      Current Outpatient Medications:    albuterol (VENTOLIN HFA) 108 (90 Base) MCG/ACT inhaler, Inhale 2 puffs into the lungs every 6 (six) hours as needed for wheezing or shortness of breath., Disp: 18 g, Rfl: 6   aspirin 325 MG tablet, Take 650 mg by mouth daily as needed for moderate pain or headache., Disp: , Rfl:    Cholecalciferol (DIALYVITE VITAMIN D 5000) 125 MCG (5000 UT) capsule, Take 5,000 Units by mouth daily., Disp: , Rfl:    fluticasone (FLONASE) 50 MCG/ACT nasal spray, Place 1 spray into both nostrils daily as needed for allergies. , Disp: , Rfl:    ibuprofen (ADVIL) 200 MG tablet, Take 200-400 mg by mouth every 6 (six) hours as needed for headache or moderate pain., Disp: , Rfl:    loratadine (CLARITIN) 5 MG chewable tablet, Chew 5-10 mg by mouth daily as needed for allergies. , Disp: , Rfl:    Multiple Vitamin (MULTIVITAMIN WITH MINERALS) TABS tablet, Take 1 tablet by mouth daily., Disp: , Rfl:    OFEV 150 MG CAPS, TAKE 1 CAPSULE (150 MG TOTAL) BY MOUTH TWO TIMES DAILY., Disp: 60 capsule, Rfl: 5   Respiratory Therapy Supplies (FLUTTER) DEVI, Use as directed., Disp: 1 each, Rfl: 0   sodium chloride HYPERTONIC 3 % nebulizer solution, Take 4 mLs by nebulization daily. (Patient taking differently: Take 4 mLs by nebulization in the morning and at bedtime.), Disp: 480 mL, Rfl: 0   vitamin B-12 (CYANOCOBALAMIN) 1000 MCG tablet, Take 1,000  mcg by mouth every other day., Disp: , Rfl:

## 2020-11-17 NOTE — Telephone Encounter (Signed)
Patient presented today for a research visit. Patient is complaining of increased chest congestion with a productive cough producing yellow phlegm, wheezing and chest tightness, all worse x 2 weeks. Patient denies any increase in baseline SOB. I advised research doctor, Dr. Chase Caller, and he asked for a standard of care phone note be sent to him to address the patients symptoms.   Patient uses [facility] on [address].    Please advise,   [place], [place], [place]

## 2020-11-18 ENCOUNTER — Other Ambulatory Visit: Payer: Self-pay

## 2020-11-18 NOTE — Progress Notes (Signed)
Erroneous encounter

## 2020-11-18 NOTE — Progress Notes (Signed)
Title:  A Randomized double-blind placebo-controlled dose escalation and verification clinical study to assess the safety and efficacy of pulsed, inhaled nitric oxide (iNO) in subjects at risk of pulmonary hypertension associated with pulmonary fibrosis on long term oxygen therapy (Part 1 and Part 2). Primary Endpoint: The placebo-corrected change for INOpulse in minutes of moderate to vigorous physical activity (MVPA) measured by actigraphy from baseline to month    Xxxxx This visit for Subject [name] with DOB: 03-Jan-1941 on 11/18/2020 for the above protocol is Visit/Encounter # consent/screening  and is for purpose of research . Subject/LAR expressed interest and consent in continuing as a study subject. Subject thanked for participation in research and contribution to science. She had bronchitis symptoms. See [place] note to handle this. AFter signing consent she went through exam and other study procedures    SIGNATURE    Dr. Brand Males, M.D., F.C.C.P, ACRP-CPI Pulmonary and [place], [facility] [place] Staff Physician, [place] Director - Interstitial Lung Disease  Program  Pulmonary [facility] and [facility] @ [place], [place], [address]   Pager: [phone], If no answer  OR between  19:00-7:00h: page [phone] Telephone (research): [phone]  4:24 PM 11/18/2020   4:24 PM 11/18/2020

## 2020-12-01 ENCOUNTER — Telehealth: Payer: Self-pay | Admitting: Internal Medicine

## 2020-12-01 NOTE — Telephone Encounter (Signed)
I followed-up with Ms. [name] to see what her status was after completing the Z-Pak and Prednisone Taper she was prescribed at her research visit on 11/17/20. Ms. [name] completed both medications but is still reporting congestion and cough producing whitish yellow mucus. I informed her that I would let Dr. Chase Caller know that she is still experiencing symptoms and that he would advise.

## 2020-12-02 NOTE — Telephone Encounter (Signed)
Called and spoke with pt letting her know that MR said we should get her in for an appt and she verbalized understanding. Appt scheduled for pt Friday 7/1 at 9:30 with Beth. Nothing further needed.

## 2020-12-02 NOTE — Telephone Encounter (Signed)
Triage  Pleas arragne a standard of care (non-research) visit to see an app this week/ Not impropving after recent round of abx/pred. Might need something different or another round  Thanks  MR

## 2020-12-04 ENCOUNTER — Encounter: Payer: Self-pay | Admitting: Primary Care

## 2020-12-04 ENCOUNTER — Ambulatory Visit (INDEPENDENT_AMBULATORY_CARE_PROVIDER_SITE_OTHER): Payer: Medicare Other | Admitting: Primary Care

## 2020-12-04 ENCOUNTER — Other Ambulatory Visit: Payer: Self-pay

## 2020-12-04 DIAGNOSIS — J849 Interstitial pulmonary disease, unspecified: Secondary | ICD-10-CM

## 2020-12-04 DIAGNOSIS — J471 Bronchiectasis with (acute) exacerbation: Secondary | ICD-10-CM | POA: Diagnosis not present

## 2020-12-04 MED ORDER — AMOXICILLIN-POT CLAVULANATE 875-125 MG PO TABS: 1.0000 | ORAL_TABLET | Freq: Two times a day (BID) | ORAL | 0 refills | Status: DC

## 2020-12-04 MED ORDER — PREDNISONE 10 MG PO TABS: ORAL_TABLET | ORAL | 0 refills | Status: DC

## 2020-12-04 NOTE — Assessment & Plan Note (Addendum)
Procutive cough with thick purulent mucus  Continue hypertonic saline nebulizer Take mucinex 675m twice a day as needed to loosen congestion  Sending in RX for Augmentin 1 tab BID x 10 days and prednisone taper

## 2020-12-04 NOTE — Assessment & Plan Note (Signed)
-  Maintained on Ofev 130m BID, tolerating medication well without GI side effects  - She has enrolled in ILD pro registry study - FU in September with Dr. RChase Caller

## 2020-12-04 NOTE — Progress Notes (Signed)
_0  ID: [name], female    DOB: May 18, 1941, 80 y.o.   MRN: [id number]  Chief Complaint  Patient presents with   Follow-up    Referring provider: Lawerance Cruel, MD  HPI: 80 year old female, never smoked.  Past medical history significant for bronchiectasis, hypersensitivity pneumonitis, interstitial fibrosis, acute on chronic respiratory failure, pulmonary hypertension, diastolic heart failure, rheumatoid arthritis.  Patient of Dr. Chase Caller.    #Interstitial lung disease (progressive) with chronic hypoxemic respiratory failure - chronic HP dx -Per [facility] September 2016: Disease present at least since 2008  - Surgical lung biopsy  November 20, 2014 at [place]:   - local opinion: diagnostic of usual interstitial pneumonia (UIP). Interestingly, there are scattered non-necrotizing granulomata in the pleura.  -Second opinion September 2016 at [facility]: pathology was reviewed at our multidisciplinary ILD conference and noted to have a variegated pattern of fibrosis with some airway centricity, granulomas noted both near the airway and more peripherally. Minimal inflammation was noted. Features not c/w with UIP. Few giant cells with cholesterol clefts. Overall pattern most in keeping with chronic HP   - 3rd opinon: June 2021 [facility] ILD [place] - Hypersensitivity Pneumonitis   -High-resolution CT chest including April 09, 2018: Lack of craniocaudal gradient and significant air trapping suggestive of chronic hypersensitivity pneumonitis [September 2016 [facility] evaluation thought CT was inconsistent with UIP]  - May 2021: stability since 2019 but progression sice 2016  -Clinical diagnosis of chronic hypersensitivity pneumonitis   - dx given Sept 2016 at [place]  -No clear-cut exposure identified at [facility] evaluation September 2016 by Dr. Dorothyann Peng  -Steroids recommended but patient preferred not to take it September 2016 at [place]   -  deferred steroids sept 2016 visit at [facility] with Dr Dorothyann Peng and again at [facility] in June 2021, Apr 2022  - nintedanib sole Rx since March 2020    10/27/2020 -  TELEPHONE CALL Type of visit: Telephone/Video Circumstance: COVID-19 national emergency Identification of patient [name] with [birth year] and MRN [id number] - 2 person identifier Risks: Risks, benefits, limitations of telephone visit explained. Patient understood and verbalized agreement to proceed Anyone else on call:  Patient location: [phone] This provider location: [address], [facility], [place], [place]   HPI [name] 80 y.o. -this telephone visit is for a few reasons  1)  grandson getting married in July in [place] and [facility] is elevated. Wants to go. Son Denies thiniong of taking her to [facility]. It is 4000 feet + /1300 meters. Advised them [place] is safeer from elevation  2) discussed heart catheterization results which are essentially normal.  3) discussed high-resolution CT chest because of concern of progression but in the last 1 year her ILD is stable on nintedanib.  She is happy about this  4) discuss future therapeutic options: At this point in time she is maxed out on therapy.  Not interested in prednisone.  She is on antifibrotic nintedanib.  We discussed the pulse inhaled nitric oxide study phase 3.  She is very interested in this.  We discussed about the device and also convenience and buying her oxygen backpack called [employer].  I believe she has a copy of the consent for the study.  We discussed the principles of this.  She is very interested.  She wants to hear from the research coordinator.  I have sent the research coordinator Lazaro Arms message.  Advised may  be a few weeks.   12/04/2020- interim hx  Dx hypersensitivity pneumonitis/ILD/chronic respiratory failure. ILD has slowly progressive over time but stable between 2016 through 2021.  Concern more  progressive in April 2022 after respiratory infection in March 2022. Patient is maintained on Ofev.  Patient had high-resolution CAT scan in April which showed appearance of the lungs is stable compared to prior study, spectrum of findings considered most compatible with alternative diagnosis favoring chronic hypersensitivity pneumonitis.  She had three-vessel coronary artery disease along with calcifications of aortic valve and mintal annulus. She has enrolled in ILD pro registry study.   She presents today for follow-up after sick call. Accompanied by female. She was prescribed Zpack back on 11/17/20 for bronchitis symptoms. She reports partial improvement with antibiotic and prednisone taper but continues to have chest congestion and productive cough with thick beige-yellow mucus.   SYMPTOM SCALE - ILD 07/29/2019  10/03/2019  12/03/2019  03/09/2020  09/24/2020  12/04/2020   O2 use 3L at rest, occ 4L with exertion 3L at rest and 4L with exertion. Acute bronchitis 2 weeks ago 3L rest, 4 L exertion 3-4L [place] On ofev. Doing rehab 3/4L [place] pulse . On ofev 3-4L POC; on OFEV  Shortness of Breath 0 -> 5 scale with 5 being worst (score 6 If unable to do)       At rest 0 1 0 0 0 1  Simple tasks - showers, clothes change, eating, shaving _0 Household (dishes, doing bed, laundry) _1 2.5 3  Shopping _2 2.5 3  Walking level at own pace _3 Walking up Stairs _4 Total (30-36) Dyspnea Score _5 How bad is your cough? 1 3.5  More often lateley 2 3  How bad is your fatigue 0 3.5 2 Better with exercise 2.5 3.5  How bad is nausea 0 0 0 0 0 0  How bad is vomiting?  0 0 0 0 0 0  How bad is diarrhea? 0 0 0 0 0 0  How bad is anxiety? 1 1._6 0  How bad is depression _7 0 0     Allergies  Allergen Reactions   Adhesive [Tape] Itching and Rash   Latex Itching and Swelling   Doxycycline Rash   Sulfonamide Derivatives Other (See Comments)    Unknown     Immunization History  Administered Date(s) Administered   Fluad Quad(high Dose 65+) 02/07/2019   Influenza, High Dose Seasonal PF 04/20/2016, 02/23/2017, 02/12/2018   Influenza-Unspecified 03/06/2014, 02/18/2015   PFIZER(Purple Top)SARS-COV-2 Vaccination 07/12/2019, 08/06/2019, 04/12/2020   Pneumococcal Conjugate-13 03/06/2014   Tdap 06/14/2017    Past Medical History:  Diagnosis Date   Allergic rhinitis    takes Claritin nightly   Anxiety    but not on any meds   Arthritis    Asthma    Carpal tunnel syndrome    CHF (congestive heart failure) ([place])    patient denies-in office note 05/2015 by Aleene Davidson   GERD (gastroesophageal reflux disease)    History of bronchitis 3-69yr ago   History of colon polyps    benign   Joint pain    Peripheral edema    takes Lasix daily   Pneumonitis, hypersensitivity ([facility] 05/2016   chronic hypersensitivity pneumonitis- Dr. MSpero Curb  Pulmonary hypertension ([facility] 06/30/2017  patient denies-in office note from Dr. Spero Curb-   Shortness of breath dyspnea    with exertion;Albuterol inhaler as needed   Thyroid nodule     Tobacco History: Social History   Tobacco Use  Smoking Status Never  Smokeless Tobacco Never   Counseling given: Not Answered   Outpatient Medications Prior to Visit  Medication Sig Dispense Refill   albuterol (VENTOLIN HFA) 108 (90 Base) MCG/ACT inhaler Inhale 2 puffs into the lungs every 6 (six) hours as needed for wheezing or shortness of breath. 18 g 6   aspirin 325 MG tablet Take 650 mg by mouth daily as needed for moderate pain or headache.     Cholecalciferol (DIALYVITE VITAMIN D 5000) 125 MCG (5000 UT) capsule Take 5,000 Units by mouth daily.     fluticasone (FLONASE) 50 MCG/ACT nasal spray Place 1 spray into both nostrils daily as needed for allergies.      ibuprofen (ADVIL) 200 MG tablet Take 200-400 mg by mouth every 6 (six) hours as needed for headache or moderate pain.     loratadine (CLARITIN) 5  MG chewable tablet Chew 5-10 mg by mouth daily as needed for allergies.      Multiple Vitamin (MULTIVITAMIN WITH MINERALS) TABS tablet Take 1 tablet by mouth daily.     OFEV 150 MG CAPS TAKE 1 CAPSULE (150 MG TOTAL) BY MOUTH TWO TIMES DAILY. 60 capsule 5   Respiratory Therapy Supplies (FLUTTER) DEVI Use as directed. 1 each 0   sodium chloride HYPERTONIC 3 % nebulizer solution Take 4 mLs by nebulization daily. (Patient taking differently: Take 4 mLs by nebulization in the morning and at bedtime.) 480 mL 0   vitamin B-12 (CYANOCOBALAMIN) 1000 MCG tablet Take 1,000 mcg by mouth every other day.     predniSONE (DELTASONE) 10 MG tablet Take 1 tablet (10 mg total) by mouth daily with breakfast. 40 mg for 1 day, then 30 mg for 1 day, then 20 mg for 1 day, then 10 mg for 1 day, and then 5 mg for 1 day and stop 10 tablet 0   azithromycin (ZITHROMAX) 250 MG tablet Take 1 tablet (250 mg total) by mouth daily. (Patient not taking: Reported on 12/04/2020) 6 tablet 0   No facility-administered medications prior to visit.    Review of Systems  Review of Systems  Constitutional: Negative.   HENT:  Positive for congestion.   Respiratory:  Positive for cough and wheezing. Negative for chest tightness.   Cardiovascular: Negative.     Physical Exam  BP (!) 150/70   Pulse 82   Temp 98.3 F (36.8 C) (Oral)   Ht _0  (1.702 m)   Wt 212 lb 12.8 oz (96.5 kg)   SpO2 94% Comment: 4 L  BMI 33.33 kg/m  Physical Exam Constitutional:      Appearance: Normal appearance.  HENT:     Head: Normocephalic and atraumatic.     Mouth/Throat:     Mouth: Mucous membranes are moist.     Pharynx: Oropharynx is clear.  Cardiovascular:     Rate and Rhythm: Normal rate and regular rhythm.  Pulmonary:     Effort: Pulmonary effort is normal.     Breath sounds: Wheezing and rales present.  Musculoskeletal:        General: Normal range of motion.  Skin:    General: Skin is warm and dry.  Neurological:     General: No  focal deficit present.     Mental Status: She is  alert and oriented to person, place, and time. Mental status is at baseline.  Psychiatric:        Mood and Affect: Mood normal.        Behavior: Behavior normal.        Thought Content: Thought content normal.        Judgment: Judgment normal.     Lab Results:  CBC    Component Value Date/Time   WBC 13.4 (H) 10/14/2020 0943   RBC 4.83 10/14/2020 0943   HGB 14.6 10/14/2020 1158   HCT 43.0 10/14/2020 1158   PLT 311 10/14/2020 0943   MCV 95.4 10/14/2020 0943   MCH 30.2 10/14/2020 0943   MCHC 31.7 10/14/2020 0943   RDW 13.8 10/14/2020 0943   LYMPHSABS 3.5 04/19/2017 1617   MONOABS 0.9 04/19/2017 1617   EOSABS 0.7 04/19/2017 1617   BASOSABS 0.1 04/19/2017 1617    BMET    Component Value Date/Time   NA 141 10/14/2020 1158   K 3.7 10/14/2020 1158   CL 100 10/14/2020 1152   CO2 31 10/14/2020 0943   GLUCOSE 101 (H) 10/14/2020 1152   BUN 7 (L) 10/14/2020 1152   CREATININE 0.40 (L) 10/14/2020 1152   CALCIUM 8.9 10/14/2020 0943   GFRNONAA >60 10/14/2020 0943   GFRAA >60 05/31/2018 1531    BNP    Component Value Date/Time   BNP 157.3 (H) 06/15/2016 1336    ProBNP    Component Value Date/Time   PROBNP 22.0 04/19/2017 1617    Imaging: No results found.   Assessment & Plan:   Bronchiectasis with (acute) exacerbation (HCC) Procutive cough with thick purulent mucus  Continue hypertonic saline nebulizer Take mucinex 654m twice a day as needed to loosen congestion  Sending in RX for Augmentin 1 tab BID x 10 days and prednisone taper  [place]stitial lung disease ([facility] - Maintained on Ofev 1528mBID, tolerating medication well without GI side effects  - She has enrolled in ILD pro registry study - FU in September with Dr. RaIllene LabradorNP 12/04/2020

## 2020-12-04 NOTE — Patient Instructions (Addendum)
Recommendations: Take mucinex 660m twice a day as needed to loosen congestion  Continue hypertonic saline nebulizer Sending in RX for Augmentin and prednisone taper Call if not better after completing antibiotic and steriod   Follow-up: Due in September with Dr. RChase Caller(recall already in)

## 2020-12-08 DIAGNOSIS — Z20822 Contact with and (suspected) exposure to covid-19: Secondary | ICD-10-CM | POA: Diagnosis not present

## 2020-12-24 ENCOUNTER — Encounter: Payer: Medicare Other | Admitting: *Deleted

## 2020-12-24 DIAGNOSIS — Z006 Encounter for examination for normal comparison and control in clinical research program: Secondary | ICD-10-CM

## 2020-12-24 DIAGNOSIS — J849 Interstitial pulmonary disease, unspecified: Secondary | ICD-10-CM

## 2020-12-24 NOTE — Research (Signed)
Disclaimer: This blurb is a brief overview of the study this patient is participating in. It is for the use of providers caring for the patient. It is not a regulatory declaration, and may or may not reflect all elements of the protocol.  Title:  A Randomized, double-blind, placebo-controlled dose escalation and verification clinical study, to assess the safety and efficacy of pulsed, inhaled nitric oxide (iNO) in subjects at risk of pulmonary hypertension associated with pulmonary fibrosis on long term oxygen therapy (Part 1 and Part 2). Primary Endpoint: The placebo-corrected change for INOpulse in minutes of moderate to vigorous physical activity (MVPA) measured by actigraphy from baseline to month 4.  Protocol Version Amendment 7 dated 25Aug 2021 Consent Version  cohort 3 approved 02Dec2021 Investigator Brochure Version 11.0 approved 20 Jan 2020  Duration of treatment: Study participants will receive iNO 45 mcg/kg IBW/hr versus placebo for 4 months (16 weeks) during Part 1. Then in Part 2: Open Label Extension (OLE) Study participants will be offered open label therapy at iNO 45 mcg/kg IBW/hr after completing the Part 1.  Protocol #: PULSE-PHPF-001 (REBUILD), ClinicalTrials.gov Identifier: [id number], **Sponsor is [employer], [place], [place] [address])   Key Inclusion Criteria:   Diagnosed with pulmonary fibrosis (all types except sarcoid) by a high resolution CT scan performed in the 6 months prior to screening  Age between 52 and 65 years (inclusive) Historical right heart catheterization (within 3 years) or current Echocardiogram (within 3 months) as assessed by the Investigator/Radiologist confirming low, or intermediate / high probability of pulmonary hypertension Have been using oxygen therapy (at rest or only with exertion; and ? 10 L/min of oxygen supplementation) by nasal cannula for at least 4 weeks prior to the screening run-in period 6MWD ? 100 meters and ?  400 meters at screening and Baseline/Randomization visits. Forced Vital Capacity ? 40% predicted within the last  6 months prior to the screening run-in period. WHO Functional Class II-IV    Key Exclusion Criteria:   Demonstrate symptomatic rebound defined as significant cardiopulmonary instability, such as hypoxemia, bradycardia, tachycardia, systemic hypotension, shortness of breath, near-syncope, and syncope, occurring within 1 hour of acute iNO withdrawal during rebound testing Use of a prostacyclin analogue, guanylate cyclase stimulator, or endothelin-receptor antagonist (ERA) PAH-specific medications regardless of reason for use, except for phospho-diesterase type-5 (PDE5) inhibitors. (The use of PDE5 inhibitors regardless of reason for use is allowed as long as the dose has been stable for at least 3 months prior to screening and there are no plans to adjust the dose during the study) The presence of emphysema unless the extent of fibrotic changes is greater than the extent of emphysema on the most recent HRCT scan.   Chronic use of a nitric oxide donor agent such as nitroglycerin or drugs known to increase methemoglobin such as lidocaine, prilocaine, benzocaine, nitroprusside, isosorbide, or dapsone at Screening Systolic heart failure) with an ejection fraction of < 40%; or severe HF with preserved EF (HFpEF; diastolic HF) as assessed by the Principal Investigator. Smoking within 3 months of Screening and/or unwilling to avoid smoking throughout the study Life expectancy of < 6 months,      Pharmacodynamics:  Nitric oxide is a compound produced by many cells of the body. It relaxes vascular smooth muscle by binding to the heme moiety of cytosolic guanylate cyclase, activating guanylate cyclase and increasing intracellular levels of cGMP, which then leads to vasodilation. When inhaled, NO produces pulmonary vasodilation.  There is no measurable effect on systemic arterial  pressure.    Pharmacokinetics:  Absorption:  The extent  of absorption of NO was independent of dose and ranged from 85% to 89% with normal respiration and 91% to 93% with maximum respiration. Elimination:  The average elimination half-life of 15N nitrate from plasma ranged from 5.5 hours to 9.8 hours.  Urinary excretion was the primary route of elimination. Nitrate was the predominant metabolite in the urine.   Contraindications:  No contraindications are known for iNO when administered to participants with PAH, ILD, or PH-COPD on LTOT.   Interactions:  Inhaled NO has been demonstrated to potentiate the effect of lowering PAP when used with vasodilators to treat PAH. Currently approved treatments include phosphodiesterase type 5 (PDE-5) inhibitors (); prostacyclin analogs and receptor agonists (; and endothelin receptor antagonists (ERAs).   Safety Data:  Pulsed iNO/ INOpulse was well tolerated. . The most frequently occurring AE, epistaxis, occurred in similar proportions of study participants in  the 3 dose cohorts (~26-27%).  Dyspnea and peripheral edema  occurred more frequently in the 2 iNO dose cohorts than in the placebo cohort.      Based upon clinical studies, the following events are considered adverse reactions:  Methemoglobinemia  Pulmonary edema associated with pre-existing LVD  Signs or symptoms of symptomatic rebound associated with acute withdrawal of iNO therapy, i.e., within 1 hour of withdrawal of therapy, including: significant cardiopulmonary instability such as systemic arterial O2 desaturation, hypoxemia, bradycardia, tachycardia, systemic hypotension, shortness of breath, near- syncope, syncope, ventricular fibrillation, or cardiac arrest.  [facility] Coordinator note :   This visit for Subject [name] with DOB: 10-06-1940 on 12/24/2020 for the above protocol is Visit/Encounter # 2 and is for purpose of research.   Subject expressed  continued interest and consent in continuing as a study subject. Subject confirmed that there was no change in contact information (e.g. address, telephone, email). Subject thanked for participation in research and contribution to science.   The Subject was informed that the PI Dr. Chase Caller continues to have oversight of the subject's visits and course through relevant discussions, reviews and also specifically of this visit by routing of this note to the [place].  During this visit on 12/24/2020, the subject completed all Visit 2 procedures per protocol. The subject also completed Visit 1- DLCO and 6MWT per instruction from study sponsor. Please refer to the subject's paper source binder for further details.   Signed by  [place] Coordinator  [facility]  [place], [place] 1:51 PM 12/24/2020

## 2021-01-11 ENCOUNTER — Encounter: Payer: Medicare Other | Admitting: *Deleted

## 2021-01-11 ENCOUNTER — Other Ambulatory Visit: Payer: Self-pay

## 2021-01-11 DIAGNOSIS — J849 Interstitial pulmonary disease, unspecified: Secondary | ICD-10-CM

## 2021-01-11 DIAGNOSIS — Z006 Encounter for examination for normal comparison and control in clinical research program: Secondary | ICD-10-CM

## 2021-01-11 NOTE — Research (Signed)
Disclaimer: This blurb is a brief overview of the study this patient is participating in. It is for the use of providers caring for the patient. It is not a regulatory declaration, and may or may not reflect all elements of the protocol.  Title:  A Randomized, double-blind, placebo-controlled dose escalation and verification clinical study, to assess the safety and efficacy of pulsed, inhaled nitric oxide (iNO) in subjects at risk of pulmonary hypertension associated with pulmonary fibrosis on long term oxygen therapy (Part 1 and Part 2). Primary Endpoint: The placebo-corrected change for INOpulse in minutes of moderate to vigorous physical activity (MVPA) measured by actigraphy from baseline to month 4. [facility] [place] Coordinator note :   This visit for Subject [name] with DOB: 02-17-1941 on 01/11/2021 for the above protocol is Visit/Encounter # 3 Randomization  and is for purpose of research.   The consent for this encounter is under Protocol Protocol Version: Amendment 7 dated 25Aug 2021  IB: version 11.0 approved 20 Jan 2020  ICF: cohort 3 approved 02Dec2021   Subject expressed continued interest and consent in continuing as a study subject. Subject confirmed that there was no change in contact information (e.g. address, telephone, email). Subject thanked for participation in research and contribution to science.  In this visit 01/11/2021 the subject will be evaluated by  Sub-Investigator named Dr. Unice Cobble  . This research coordinator has verified that the above investigator is up to date with his/her training logs.   The Subject was informed that the PI Dr. Chase Caller continues to have oversight of the subject's visits and course  through relevant discussions, reviews and also specifically of this visit by routing of this note to the [facility].  This visit is a key visit of randomization. The PI is not available for this visit. Because the PI is not available, the  sub-I reported and CRC has confirmed that the PI  discussed the visit a-priori with the sub-investigator.  All procedures completed per the above mentioned protocol. Subject educated on need to use Inopulse device >12 hours a day as many days as possible. Subject stated she is committed to doing so and states the device is a lot to get used to and she has been working on figuring out the best ways to take it with her places and to incorporate it more into her daily life. Subject states she will work on increasing her wear time.  Refer to the subjects paper source binder for further details of the visit.     Signed by [place], CMA, BS, [place] Coordinator [place], [place] 12:42 PM 01/11/2021

## 2021-01-13 NOTE — Progress Notes (Signed)
[name] DOB Dec 27, 1940 was seen 01/11/2021 for randomization under protocol version: Amendment 7. Cardiopulmonary symptoms are stable on maintenance oxygen. She stated that she had had a cardiac catheterization to assess presence of pulmonary HTN as role in her DOE and had not been informed of the results.  With her permission I reviewed the cath report with her.  It revealed no significant CAD and only minimally elevated PA pressures. Exam revealed fine rales at the bases with some bronchovesicular quality to the upper lobe breath sounds.  Grade 7/4-4 systolic murmur was present at the base.  There is slight increase in S2.  There is minimal clubbing of the nailbeds.  None of these physical findings were of clinical significance in reference to the trial.                                                        Unice Cobble, MD, SI

## 2021-02-11 ENCOUNTER — Telehealth: Payer: Self-pay | Admitting: Internal Medicine

## 2021-02-11 ENCOUNTER — Encounter: Payer: Medicare Other | Admitting: *Deleted

## 2021-02-11 DIAGNOSIS — Z006 Encounter for examination for normal comparison and control in clinical research program: Secondary | ICD-10-CM

## 2021-02-11 DIAGNOSIS — J849 Interstitial pulmonary disease, unspecified: Secondary | ICD-10-CM

## 2021-02-11 DIAGNOSIS — J209 Acute bronchitis, unspecified: Secondary | ICD-10-CM

## 2021-02-11 NOTE — Telephone Encounter (Signed)
[name] was seen today for Visit 4 of the REBUILD study. She reports a productive cough that started approximately 3 weeks ago. Reports cough progressively worsened last 2 weeks. She is coughing up "yellow mucus". Has been taking Mucinex daily for the past week but has seen no improvement. Reported no increased shortness of breath. She was informed that this CRC would inform PI Dr. Chase Caller and he would advise further instructions.

## 2021-02-11 NOTE — Telephone Encounter (Signed)
EMily  Tiffini G Danziger moight have another Acute bronchitis.  Plan - if she is willing for below please offer her   - ask her if she will give sputum sample for gram stgain and culture  - do Z PAK - and Take prednisone 40 mg daily x 2 days, then 26m daily x 2 days, then 164mdaily x 2 days, then 2m76maily x 2 days and stop    Allergies  Allergen Reactions   Adhesive [Tape] Itching and Rash   Latex Itching and Swelling   Doxycycline Rash   Sulfonamide Derivatives Other (See Comments)    Unknown

## 2021-02-11 NOTE — Research (Signed)
Disclaimer: This blurb is a brief overview of the study this patient is participating in. It is for the use of providers caring for the patient. It is not a regulatory declaration, and may or may not reflect all elements of the protocol.  Title:  A Randomized, double-blind, placebo-controlled dose escalation and verification clinical study, to assess the safety and efficacy of pulsed, inhaled nitric oxide (iNO) in subjects at risk of pulmonary hypertension associated with pulmonary fibrosis on long term oxygen therapy (Part 1 and Part 2). Primary Endpoint: The placebo-corrected change for INOpulse in minutes of moderate to vigorous physical activity (MVPA) measured by actigraphy from baseline to month 4.  Protocol Version Amendment 7 dated 25Aug 2021 Consent Version cohort 3 approved 02Dec2021 Investigator Brochure version 11.0 approved 20 Jan 2020  Duration of treatment: Study participants will receive iNO 45 mcg/kg IBW/hr versus placebo for 4 months (16 weeks) during Part 1. Then in Part 2: Open Label Extension (OLE) Study participants will be offered open label therapy at iNO 45 mcg/kg IBW/hr after completing the Part 1.  Protocol #: [id number]-001 (REBUILD), ClinicalTrials.gov Identifier: [id number], **Sponsor is [employer], [place], [place] [address])   Key Inclusion Criteria:   Diagnosed with pulmonary fibrosis (all types except sarcoid) by a high resolution CT scan performed in the 6 months prior to screening  Age between 69 and 53 years (inclusive) Historical right heart catheterization (within 3 years) or current Echocardiogram (within 3 months) as assessed by the Investigator/Radiologist confirming low, or intermediate / high probability of pulmonary hypertension Have been using oxygen therapy (at rest or only with exertion; and ? 10 L/min of oxygen supplementation) by nasal cannula for at least 4 weeks prior to the screening run-in period 6MWD ? 100 meters and ?  400 meters at screening and Baseline/Randomization visits. Forced Vital Capacity ? 40% predicted within the last  6 months prior to the screening run-in period. WHO Functional Class II-IV    Key Exclusion Criteria:   Demonstrate symptomatic rebound defined as significant cardiopulmonary instability, such as hypoxemia, bradycardia, tachycardia, systemic hypotension, shortness of breath, near-syncope, and syncope, occurring within 1 hour of acute iNO withdrawal during rebound testing Use of a prostacyclin analogue, guanylate cyclase stimulator, or endothelin-receptor antagonist (ERA) PAH-specific medications regardless of reason for use, except for phospho-diesterase type-5 (PDE5) inhibitors. (The use of PDE5 inhibitors regardless of reason for use is allowed as long as the dose has been stable for at least 3 months prior to screening and there are no plans to adjust the dose during the study) The presence of emphysema unless the extent of fibrotic changes is greater than the extent of emphysema on the most recent HRCT scan.   Chronic use of a nitric oxide donor agent such as nitroglycerin or drugs known to increase methemoglobin such as lidocaine, prilocaine, benzocaine, nitroprusside, isosorbide, or dapsone at Screening Systolic heart failure) with an ejection fraction of < 40%; or severe HF with preserved EF (HFpEF; diastolic HF) as assessed by the Principal Investigator. Smoking within 3 months of Screening and/or unwilling to avoid smoking throughout the study Life expectancy of < 6 months,      Pharmacodynamics:  Nitric oxide is a compound produced by many cells of the body. It relaxes vascular smooth muscle by binding to the heme moiety of cytosolic guanylate cyclase, activating guanylate cyclase and increasing intracellular levels of cGMP, which then leads to vasodilation. When inhaled, NO produces pulmonary vasodilation.  There is no measurable effect on systemic arterial pressure.  Pharmacokinetics:  Absorption:  The extent  of absorption of NO was independent of dose and ranged from 85% to 89% with normal respiration and 91% to 93% with maximum respiration. Elimination:  The average elimination half-life of 15N nitrate from plasma ranged from 5.5 hours to 9.8 hours.  Urinary excretion was the primary route of elimination. Nitrate was the predominant metabolite in the urine.   Contraindications:  No contraindications are known for iNO when administered to participants with PAH, ILD, or PH-COPD on LTOT.   Interactions:  Inhaled NO has been demonstrated to potentiate the effect of lowering PAP when used with vasodilators to treat PAH. Currently approved treatments include phosphodiesterase type 5 (PDE-5) inhibitors (); prostacyclin analogs and receptor agonists (; and endothelin receptor antagonists (ERAs).   Safety Data:  Pulsed iNO/ INOpulse was well tolerated. . The most frequently occurring AE, epistaxis, occurred in similar proportions of study participants in  the 3 dose cohorts (~26-27%).  Dyspnea and peripheral edema  occurred more frequently in the 2 iNO dose cohorts than in the placebo cohort.      Based upon clinical studies, the following events are considered adverse reactions:  Methemoglobinemia  Pulmonary edema associated with pre-existing LVD  Signs or symptoms of symptomatic rebound associated with acute withdrawal of iNO therapy, i.e., within 1 hour of withdrawal of therapy, including: significant cardiopulmonary instability such as systemic arterial O2 desaturation, hypoxemia, bradycardia, tachycardia, systemic hypotension, shortness of breath, near- syncope, syncope, ventricular fibrillation, or cardiac arrest.    [facility] [place] [facility] Coordinator note :   This visit for Subject [name] with DOB: 19-Aug-1940 on 02/11/2021 for the above protocol is Visit/Encounter #4 and is for purpose of research.    Subject expressed  continued interest and consent in continuing as a study subject. Subject confirmed that there was no change in contact information (e.g. address, telephone, email). Subject thanked for participation in research and contribution to science.    The Subject was informed that the PI Dr. Chase Caller continues to have oversight of the subject's visits and course  through relevant discussions, reviews and also specifically of this visit by routing of this note to the [place].  During this visit on 02/11/2021 all Visit 4 procedures were completed per protocol. For complete details, please refer to subject's paper source binder.    Signed by  Lazaro Arms Clinical Research Coordinator  [facility]  [place], [place] 3:29 PM 02/11/2021

## 2021-02-12 MED ORDER — AZITHROMYCIN 250 MG PO TABS: ORAL_TABLET | ORAL | 0 refills | Status: DC

## 2021-02-12 MED ORDER — PREDNISONE 10 MG PO TABS: ORAL_TABLET | ORAL | 0 refills | Status: AC

## 2021-02-12 NOTE — Telephone Encounter (Signed)
Called and spoke with pt letting her know the info stated by MR and she verbalized understanding. Rx for pred taper and zpak have been placed for pt and sputum specimen cup and instructions has been placed up front. Nothing further needed.

## 2021-02-15 ENCOUNTER — Other Ambulatory Visit: Payer: Medicare Other

## 2021-02-15 DIAGNOSIS — J209 Acute bronchitis, unspecified: Secondary | ICD-10-CM

## 2021-02-16 LAB — RESPIRATORY CULTURE OR RESPIRATORY AND SPUTUM CULTURE: MICRO NUMBER:: 12360556

## 2021-03-09 ENCOUNTER — Other Ambulatory Visit: Payer: Self-pay

## 2021-03-09 ENCOUNTER — Encounter: Payer: Medicare Other | Admitting: *Deleted

## 2021-03-09 DIAGNOSIS — Z006 Encounter for examination for normal comparison and control in clinical research program: Secondary | ICD-10-CM

## 2021-03-09 DIAGNOSIS — J849 Interstitial pulmonary disease, unspecified: Secondary | ICD-10-CM

## 2021-03-09 NOTE — Research (Signed)
Disclaimer: This blurb is a brief overview of the study this patient is participating in. It is for the use of providers caring for the patient. It is not a regulatory declaration, and may or may not reflect all elements of the protocol.  Title:  A Randomized, double-blind, placebo-controlled dose escalation and verification clinical study, to assess the safety and efficacy of pulsed, inhaled nitric oxide (iNO) in subjects at risk of pulmonary hypertension associated with pulmonary fibrosis on long term oxygen therapy (Part 1 and Part 2). Primary Endpoint: The placebo-corrected change for INOpulse in minutes of moderate to vigorous physical activity (MVPA) measured by actigraphy from baseline to month 4.  [facility] Coordinator note :   This visit for Subject [name] with DOB: 02/05/1941 on 03/09/2021 for the above protocol is Visit/Encounter # 5 and is for purpose of research.   The consent for this encounter is under Protocol Version: Amendment 7 dated 25Aug 2021  IB: version 11.0 approved 20 Jan 2020  ICF: cohort 3 approved 02Dec2021 Subject expressed continued interest and consent in continuing as a study subject. Subject confirmed that there was  no change in contact information (e.g. address, telephone, email). Subject thanked for participation in research and contribution to science.   The Subject was informed that the PI Dr. Chase Caller continues to have oversight of the subject's visits and course  through relevant discussions, reviews and also specifically of this visit by routing of this note to the [place].  All procedures completed per the above mentioned protocol. Subject re-educated on the importance of using the INOpulse device at least 12 hours a day per protocol. Subject stated understanding. Refer to the subjects paper source binder for further details of the visit.    Signed by [place] Bing, CMA, BS, [place]  Coordinator [place], [place] 2:19 PM 03/09/2021

## 2021-04-06 ENCOUNTER — Other Ambulatory Visit: Payer: Self-pay

## 2021-04-06 ENCOUNTER — Encounter: Payer: Medicare Other | Admitting: *Deleted

## 2021-04-06 DIAGNOSIS — Z006 Encounter for examination for normal comparison and control in clinical research program: Secondary | ICD-10-CM

## 2021-04-06 DIAGNOSIS — J849 Interstitial pulmonary disease, unspecified: Secondary | ICD-10-CM

## 2021-04-15 NOTE — Research (Signed)
Disclaimer: This blurb is a brief overview of the study this patient is participating in. It is for the use of providers caring for the patient. It is not a regulatory declaration, and may or may not reflect all elements of the protocol.  Title:  A Randomized, double-blind, placebo-controlled dose escalation and verification clinical study, to assess the safety and efficacy of pulsed, inhaled nitric oxide (iNO) in subjects at risk of pulmonary hypertension associated with pulmonary fibrosis on long term oxygen therapy (Part 1 and Part 2). Primary Endpoint: The placebo-corrected change for INOpulse in minutes of moderate to vigorous physical activity (MVPA) measured by actigraphy from baseline to month 4.  [facility] Coordinator note :   This visit for Subject [name] with DOB: March 01, 1941 on 04/06/2021 for the above protocol is Visit/Encounter # 6 and is for purpose of research.   The consent for this encounter is under Protocol Version: Amendment 7 dated 25Aug 2021  IB: version 11.0 approved 20 Jan 2020  ICF: cohort 3 approved 02Dec2021 Subject expressed continued interest and consent in continuing as a study subject. Subject confirmed that there was  no change in contact information (e.g. address, telephone, email). Subject thanked for participation in research and contribution to science.   The Subject was informed that the PI Dr. Chase Caller continues to have oversight of the subject's visits and course  through relevant discussions, reviews and also specifically of this visit by routing of this note to the [place].  All procedures completed per the above mentioned protocol. Subject re-educated on the importance of using the INOpulse device at least 12 hours a day per protocol. Subject stated understanding. Refer to the subjects paper source binder for further details of the visit.    Signed by [place] Assistant [facility] [place],  [place] 10:40 AM 04/15/2021

## 2021-04-23 ENCOUNTER — Other Ambulatory Visit: Payer: Self-pay | Admitting: Internal Medicine

## 2021-05-05 DIAGNOSIS — M25561 Pain in right knee: Secondary | ICD-10-CM | POA: Diagnosis not present

## 2021-05-05 DIAGNOSIS — M1711 Unilateral primary osteoarthritis, right knee: Secondary | ICD-10-CM | POA: Diagnosis not present

## 2021-05-06 ENCOUNTER — Encounter (INDEPENDENT_AMBULATORY_CARE_PROVIDER_SITE_OTHER): Payer: Medicare Other | Admitting: *Deleted

## 2021-05-06 ENCOUNTER — Other Ambulatory Visit: Payer: Self-pay

## 2021-05-06 DIAGNOSIS — Z23 Encounter for immunization: Secondary | ICD-10-CM | POA: Diagnosis not present

## 2021-05-06 NOTE — Research (Signed)
Disclaimer: This blurb is a brief overview of the study this patient is participating in. It is for the use of providers caring for the patient. It is not a regulatory declaration, and may or may not reflect all elements of the protocol.  Title:  A Randomized, double-blind, placebo-controlled dose escalation and verification clinical study, to assess the safety and efficacy of pulsed, inhaled nitric oxide (iNO) in subjects at risk of pulmonary hypertension associated with pulmonary fibrosis on long term oxygen therapy (Part 1 and Part 2). Primary Endpoint: The placebo-corrected change for INOpulse in minutes of moderate to vigorous physical activity (MVPA) measured by actigraphy from baseline to month 4.  [facility] [place] Coordinator note :   This visit for Subject [name] with DOB: 1941/04/17 on 05/06/2021 for the above protocol is Visit/Encounter # 7 and is for purpose of research.   The consent for this encounter is under Protocol Version: Amendment 7 dated 25Aug 2021  IB: version 11.0 approved 20 Jan 2020  ICF: cohort 3 approved 02Dec2021 Subject expressed continued interest and consent in continuing as a study subject. Subject confirmed that there was  no change in contact information (e.g. address, telephone, email). Subject thanked for participation in research and contribution to science.   The Subject was informed that the PI Dr. Chase Caller continues to have oversight of the subject's visits and course  through relevant discussions, reviews and also specifically of this visit by routing of this note to the [place].  All procedures completed per the above mentioned protocol. Subject re-educated on the importance of using the INOpulse device at least 12 hours a day per protocol. Subject stated understanding. Patient has rolled over into Open Label Extension and is knowingly taking study drug. She was monitored for two hours for any adverse reactions. Refer to the  subjects paper source binder for further details of the visit.    Signed by [place] Assistant [facility] [place], [place] 3:34 PM 05/06/2021

## 2021-05-07 NOTE — Progress Notes (Signed)
[name], DOB Oct 17, 1940,was seen as subject in a clinical trial assessing iNO on MVPA in subjects with Pulmonary Fibrosis @ risk for pulmonary hypertension. Cardiopulmonary symptoms are stable Pertinent physical findings include: Grade 1- 1.5 rough systolic murmur loudest @ LUSB ;very subtle clubbing of nailbeds;rales @ lung bases, RLL > LLL All physical findings NCS                                                                     Hendricks Limes MD,SI

## 2021-05-19 ENCOUNTER — Other Ambulatory Visit: Payer: Self-pay

## 2021-05-19 ENCOUNTER — Encounter: Payer: Medicare Other | Admitting: *Deleted

## 2021-05-19 DIAGNOSIS — J849 Interstitial pulmonary disease, unspecified: Secondary | ICD-10-CM

## 2021-05-19 DIAGNOSIS — Z006 Encounter for examination for normal comparison and control in clinical research program: Secondary | ICD-10-CM

## 2021-05-19 NOTE — Research (Signed)
Disclaimer: This blurb is a brief overview of the study this patient is participating in. It is for the use of providers caring for the patient. It is not a regulatory declaration, and may or may not reflect all elements of the protocol.  Title:  A Randomized, double-blind, placebo-controlled dose escalation and verification clinical study, to assess the safety and efficacy of pulsed, inhaled nitric oxide (iNO) in subjects at risk of pulmonary hypertension associated with pulmonary fibrosis on long term oxygen therapy (Part 1 and Part 2). Primary Endpoint: The placebo-corrected change for INOpulse in minutes of moderate to vigorous physical activity (MVPA) measured by actigraphy from baseline to month 4.  [facility] Coordinator note :   This visit for Subject NANAMI WHITELAW with DOB: April 15, 1941 on 05/19/2021 for the above protocol is Visit/Encounter # 2 OLE and is for purpose of research.   The consent for this encounter is under Protocol Version: Amendment 7 dated 25Aug 2021  IB: version 11.0 approved 20 Jan 2020  ICF: cohort 3 approved 02Dec2021 Subject expressed continued interest and consent in continuing as a study subject. Subject confirmed that there was  no change in contact information (e.g. address, telephone, email). Subject thanked for participation in research and contribution to science.   The Subject was informed that the PI Dr. Chase Caller continues to have oversight of the subject's visits and course  through relevant discussions, reviews and also specifically of this visit by routing of this note to the [place].  All procedures completed per the above mentioned protocol. Subject re-educated on the importance of using the INOpulse device at least 12 hours a day per protocol. Subject stated understanding. Patient has rolled over into Open Label Extension and is knowingly taking study drug. The visit today occurred over the telephone and any new adverse events  were reviewed. Refer to the subjects paper source binder for further details of the visit.    Signed by [place] Assistant [facility] [place], [place] 11:06 AM 05/19/2021

## 2021-05-26 ENCOUNTER — Ambulatory Visit (INDEPENDENT_AMBULATORY_CARE_PROVIDER_SITE_OTHER): Payer: Medicare Other | Admitting: Acute Care

## 2021-05-26 ENCOUNTER — Ambulatory Visit (INDEPENDENT_AMBULATORY_CARE_PROVIDER_SITE_OTHER): Payer: Medicare Other

## 2021-05-26 ENCOUNTER — Other Ambulatory Visit: Payer: Self-pay

## 2021-05-26 ENCOUNTER — Other Ambulatory Visit: Payer: Self-pay | Admitting: Acute Care

## 2021-05-26 ENCOUNTER — Encounter: Payer: Self-pay | Admitting: Acute Care

## 2021-05-26 VITALS — BP 168/82 | HR 99 | Temp 98.8°F | Ht 68.0 in | Wt 202.0 lb

## 2021-05-26 DIAGNOSIS — R059 Cough, unspecified: Secondary | ICD-10-CM

## 2021-05-26 DIAGNOSIS — R0600 Dyspnea, unspecified: Secondary | ICD-10-CM | POA: Diagnosis not present

## 2021-05-26 DIAGNOSIS — J479 Bronchiectasis, uncomplicated: Secondary | ICD-10-CM

## 2021-05-26 DIAGNOSIS — R0602 Shortness of breath: Secondary | ICD-10-CM

## 2021-05-26 MED ORDER — SODIUM CHLORIDE 3 % IN NEBU: 4.0000 mL | INHALATION_SOLUTION | Freq: Two times a day (BID) | RESPIRATORY_TRACT | 3 refills | Status: DC

## 2021-05-26 MED ORDER — HYDROCODONE BIT-HOMATROP MBR 5-1.5 MG/5ML PO SOLN: 5.0000 mL | Freq: Every evening | ORAL | 0 refills | Status: DC | PRN

## 2021-05-26 MED ORDER — ALBUTEROL SULFATE HFA 108 (90 BASE) MCG/ACT IN AERS: 2.0000 | INHALATION_SPRAY | Freq: Four times a day (QID) | RESPIRATORY_TRACT | 6 refills | Status: DC | PRN

## 2021-05-26 MED ORDER — AMOXICILLIN-POT CLAVULANATE 875-125 MG PO TABS: 1.0000 | ORAL_TABLET | Freq: Two times a day (BID) | ORAL | 0 refills | Status: DC

## 2021-05-26 MED ORDER — PREDNISONE 10 MG PO TABS: ORAL_TABLET | ORAL | 0 refills | Status: DC

## 2021-05-26 NOTE — Patient Instructions (Addendum)
It is good to see you today. We will do a CXR today>> stat read We will call you with the results We will send in a prescription for Augmentin 875 mg BID x 10 days.  Prednisone taper; 10 mg tablets: 4 tabs x 2 days, 3 tabs x 2 days, 2 tabs x 2 days 1 tab x 2 days then stop.  Sips of water instead of throat clearing Sugar Free [employer] or [employer] originals for throat soothing. Delsym Cough syrup 5 cc's every 12 hours Continue Ofev 114m BID Continue oxygen 3 L at rest and 4 L with exertion Saturation goal is > 88% Continue flutter valve several times a day Continue Mucinex 600 mg twice daily  Hydromet 5 cc at bedtime for cough as needed  Follow up in 2 weeks to ensure you are better. With SJudson RochNP or TRexene EdisonNP We will send in a prescription for hypertonic saline  Please use hypertonic saline nebs twice daily as needed  We will renew your albuterol inhaler Call u[facility]ore[facility]ooner if you need u[place]oreasooner Please contact office for sooner follow up if symptoms do not improve or worsen or seek emergency care

## 2021-05-26 NOTE — Progress Notes (Addendum)
History of Present Illness [name] is a 80 y.o. female never smoker with bronchiectasis and ILD. She is followed by Dr. Chase Caller  05/26/2021 Pt. Presents for an acute visit. She states she had been around her grand kids this weekend. Grand kids have been sick. They both had a viral infection that has not required pediatrician visit.  She has developed a productive cough and increased mucus build up. She states sputum is thick and green to yellow. She has not been using her fluitter valve, and she has not been using her Mucinex regularly. She has a very strong productive cough with thick purulent mucus. She denies any fever at present. She wonders if she had a Fever   Sunday or Monday, but states she does not have one now. In office her temp was 98.8. She has active wheezing. She does feel this deep in her chest. She has had the flu vaccine and all Covid boosters.  She states she needs her Ventolin refilled and she would like 2 of them iof possible. I tols her this is up to her insurance. . She usually  uses her rescue inhaler every night, but now she is using it 2-3 times a day. She is currently taking mucinex DM for her cough.    Test Results: 05/26/2021 CXR  Diffuse coarse interstitial changes and nodular opacities in the bilateral chest show no change since previous imaging. There is no lobar consolidation. No sign of pleural effusion. Flattening of the LEFT and RIGHT hemidiaphragm.   On limited assessment there is no acute skeletal process.   IMPRESSION: No interval change in diffuse coarse interstitial changes and nodular opacities in the bilateral chest compatible with known chronic interstitial disease. Based on pattern of disease and extensive nature of disease of be difficult to exclude superimposed acute process. CBC Latest Ref Rng & Units 10/14/2020 10/14/2020 10/14/2020  WBC 4.0 - 10.5 K/uL - - -  Hemoglobin 12.0 - 15.0 g/dL 14.6 14.3 14.6  Hematocrit 36.0 - 46.0  % 43.0 42.0 43.0  Platelets 150 - 400 K/uL - - -    BMP Latest Ref Rng & Units 10/14/2020 10/14/2020 10/14/2020  Glucose 70 - 99 mg/dL - - 101(H)  BUN 8 - 23 mg/dL - - 7(L)  Creatinine 0.44 - 1.00 mg/dL - - 0.40(L)  Sodium 135 - 145 mmol/L 141 141 140  Potassium 3.5 - 5.1 mmol/L 3.7 3.7 3.7  Chloride 98 - 111 mmol/L - - 100  CO2 22 - 32 mmol/L - - -  Calcium 8.9 - 10.3 mg/dL - - -    BNP    Component Value Date/Time   BNP 157.3 (H) 06/15/2016 1336    ProBNP    Component Value Date/Time   PROBNP 22.0 04/19/2017 1617    PFT    Component Value Date/Time   FEV1PRE 1.21 09/24/2020 1456   FEV1POST 1.46 07/19/2019 1554   FVCPRE 1.52 09/24/2020 1456   FVCPOST 1.87 07/19/2019 1554   TLC 3.63 07/19/2019 1554   DLCOUNC 5.95 09/24/2020 1456   PREFEV1FVCRT 80 09/24/2020 1456   PSTFEV1FVCRT 78 07/19/2019 1554    DG Chest 2 View  Result Date: 05/26/2021 CLINICAL DATA:  Dyspnea in an 80 year old female. EXAM: CHEST - 2 VIEW COMPARISON:  CT of the chest from April of 2022 and prior chest radiographs. FINDINGS: Cardiomediastinal contours and hilar structures are stable with mild cardiac enlargement. Diffuse coarse interstitial changes and nodular opacities in the bilateral chest show no change since  previous imaging. There is no lobar consolidation. No sign of pleural effusion. Flattening of the LEFT and RIGHT hemidiaphragm. On limited assessment there is no acute skeletal process. IMPRESSION: No interval change in diffuse coarse interstitial changes and nodular opacities in the bilateral chest compatible with known chronic interstitial disease. Based on pattern of disease and extensive nature of disease of be difficult to exclude superimposed acute process. Electronically Signed   By: Zetta Bills M.D.   On: 05/26/2021 14:58     Past medical hx Past Medical History:  Diagnosis Date   Allergic rhinitis    takes Claritin nightly   Anxiety    but not on any meds   Arthritis    Asthma     Carpal tunnel syndrome    CHF (congestive heart failure) ([place])    patient denies-in office note 05/2015 by Aleene Davidson   GERD (gastroesophageal reflux disease)    History of bronchitis 3-7yr ago   History of colon polyps    benign   Joint pain    Peripheral edema    takes Lasix daily   P[place]onitis, hypersensitivity ([place] 05/[place]   chronic hypersensitivity pneumonitis- Dr. MSpero Curb  [place] 06[place]   patient denies-in office note from Dr. MSpero Curb   Shortness of breath dyspnea    with exertion;Albuterol inhaler as needed   Thyroid nodule      Social History   Tobacco Use   Smoking status: Never   Smokeless tobacco: Never  Vaping Use   Vaping Use: Never used  Substance Use Topics   Alcohol use: No    Alcohol/week: 0.0 standard drinks   Drug use: No    Ms.DElseyreports that she has never smoked. She has never used smokeless tobacco. She reports that she does not drink alcohol and does not use drugs.  Tobacco Cessation: Never smoker   Past surgical hx, Family hx, Social hx all reviewed.  Current Outpatient Medications on File Prior to Visit  Medication Sig   albuterol (VENTOLIN HFA) 108 (90 Base) MCG/ACT inhaler Inhale 2 puffs into the lungs every 6 (six) hours as needed for wheezing or shortness of breath.   aspirin 325 MG tablet Take 650 mg by mouth daily as needed for moderate pain or headache.   azithromycin (ZITHROMAX) 250 MG tablet Take two today and then one daily until finished.   Cholecalciferol (DIALYVITE VITAMIN D 5000) 125 MCG (5000 UT) capsule Take 5,000 Units by mouth daily.   fluticasone (FLONASE) 50 MCG/ACT nasal spray Place 1 spray into both nostrils daily as needed for allergies.    ibuprofen (ADVIL) 200 MG tablet Take 200-400 mg by mouth every 6 (six) hours as needed for headache or moderate pain.   loratadine (CLARITIN) 5 MG chewable tablet Chew 5-10 mg by mouth daily as needed for allergies.    Multiple Vitamin  (MULTIVITAMIN WITH MINERALS) TABS tablet Take 1 tablet by mouth daily.   Nintedanib (OFEV) 150 MG CAPS TAKE 1 CAPSULE BY MOUTH TWICE DAILY   Respiratory Therapy Supplies (FLUTTER) DEVI Use as directed.   vitamin B-12 (CYANOCOBALAMIN) 1000 MCG tablet Take 1,000 mcg by mouth every other day.   No current facility-administered medications on file prior to visit.     Allergies  Allergen Reactions   Adhesive [Tape] Itching and Rash   Latex Itching and Swelling   Doxycycline Rash   Sulfonamide Derivatives Other (See Comments)    Unknown    Review Of Systems:  Constitutional:   No  weight loss, night sweats,  Fevers, chills, +fatigue, or  lassitude.  HEENT:   No headaches,  Difficulty swallowing,  Tooth/dental problems, or  Sore throat,                No sneezing, itching, ear ache, nasal congestion, post nasal drip,   CV:  No chest pain,  Orthopnea, PND, swelling in lower extremities, anasarca, dizziness, palpitations, syncope.   GI  No heartburn, indigestion, abdominal pain, nausea, vomiting, diarrhea, change in bowel habits, loss of appetite, bloody stools.   Resp: + shortness of breath with exertion less at rest.  + excess mucus, + productive cough,  + non-productive cough,  No coughing up of blood.  + change in color of mucus.  + wheezing.  No chest wall deformity  Skin: no rash or lesions.  GU: no dysuria, change in color of urine, no urgency or frequency.  No flank pain, no hematuria   MS:  No joint pain or swelling.  + decreased range of motion.  No back pain.  Psych:  No change in mood or affect. No depression or anxiety.  No memory loss.   Vital Signs BP (!) 168/82 (BP Location: Left Arm, Patient Position: Sitting, Cuff Size: Normal)    Pulse 99    Temp 98.8 F (37.1 C) (Oral)    Ht _0  (1.727 m)    Wt 202 lb (91.6 kg)    SpO2 95%    BMI 30.71 kg/m    Physical Exam:  General- No distress,  A&Ox3, pleasant ENT: No sinus tenderness, TM clear, pale nasal mucosa, no  oral exudate,no post nasal drip, no LAN Cardiac: S1, S2, regular rate and rhythm, no murmur Chest: + wheeze/ No rales/ dullness; no accessory muscle use, no nasal flaring, no sternal retractions, + systolic murmur, Crackles per bases bilaterally RLL > LLL, wearing oxygen per [place] Abd.: Soft Non-tender, ND, BS + Ext: No clubbing cyanosis, edema, subtle clubbing of nailbeds Neuro:  Physically deconditioned at baseline, A&O x 3, MAE x 4, Walks with a walker Skin: No rashes, warm and dry, no lesions Psych: normal mood and behavior   Assessment/Plan ILD Flare vs Respiratory viral illness Bronchiectasis Green thick secretions  Cough Plan We will do a CXR today>> stat read We will call you with the results We will send in a prescription for Augmentin 875 mg BID x 10 days.  Prednisone taper; 10 mg tablets: 4 tabs x 2 days, 3 tabs x 2 days, 2 tabs x 2 days 1 tab x 2 days then stop.  Sips of water instead of throat clearing Sugar Free [employer] or [employer] originals for throat soothing. Delsym Cough syrup 5 cc's every 12 hours Continue Ofev 169m BID Continue oxygen 3 L at rest and 4 L with exertion Saturation goal is > 88% Continue flutter valve several times a day Continue Mucinex 600 mg twice daily  Hydromet 5 cc at bedtime for cough as needed  Follow up in 2 weeks to ensure you are better. With SJudson RochNP or TRexene EdisonNP We will send in a prescription for hypertonic saline  Please use hypertonic saline nebs twice daily as needed  We will renew your albuterol inhaler Call uKoreasooner if you need uKoreasooner Please contact office for sooner follow up if symptoms do not improve or worsen or seek emergency care     I spent 40 minutes dedicated to the care of this patient on the date of this encounter  to include pre-visit review of records, face-to-face time with the patient discussing conditions above, post visit ordering of testing, clinical documentation with the electronic health  record, making appropriate referrals as documented, and communicating necessary information to the patient's healthcare team.   Addendum I have called the patient with the results of her CXR. I explained that her ILD scarring makes it difficult for them to see an acute issue. We are already treating her with Augmentin and prednisone.I have asked her to call the office if she gets worse not better. She verbalized understanding.   Magdalen Spatz, NP 05/26/2021  5:09 PM

## 2021-06-14 ENCOUNTER — Ambulatory Visit (INDEPENDENT_AMBULATORY_CARE_PROVIDER_SITE_OTHER): Payer: Medicare Other | Admitting: Acute Care

## 2021-06-14 ENCOUNTER — Encounter: Payer: Self-pay | Admitting: Acute Care

## 2021-06-14 ENCOUNTER — Other Ambulatory Visit: Payer: Self-pay

## 2021-06-14 DIAGNOSIS — R0600 Dyspnea, unspecified: Secondary | ICD-10-CM

## 2021-06-14 MED ORDER — PREDNISONE 10 MG PO TABS: ORAL_TABLET | ORAL | 0 refills | Status: DC

## 2021-06-14 MED ORDER — SODIUM CHLORIDE 3 % IN NEBU: INHALATION_SOLUTION | RESPIRATORY_TRACT | 1 refills | Status: DC

## 2021-06-14 NOTE — Patient Instructions (Addendum)
It is good to see you today. We will prescribe another prednisone taper for your cough and shortness of breath.  Prednisone taper; 10 mg tablets: 4 tabs x 3 days, 3 tabs x 3 days, 2 tabs x 3 days 1 tab x 3 days then stop.  We will order a HRCT of the chest. You will get a call to get this scheduled.  We will call you with results Follow up in 2 weeks to review the results and see how you are doing. ( CT must be done before follow up appointment)  Continue Flutter Valve 2 times daily with Mucinex.  Add Claritin or Zyrtec daily for post nasal drip.  Please contact office for sooner follow up if symptoms do not improve or worsen or seek emergency care   Use 3% saline nebs as needed for chest congestion

## 2021-06-14 NOTE — Progress Notes (Signed)
History of Present Illness [name] is a 81 y.o. female never smoker with bronchiectasis and ILD. She is followed by Dr. Chase Caller   06/14/2021 Pt. Was seen 05/26/2021 for ILD Flare vs Respiratory viral illness / bronchiectasis/ Thick green secretions . CXR revealed stable ILD and difficult to exclude acute process. I treated her with Augmentin and prednisone taper. She continues to complain of shortness of breath. She is using oxygen at 4 L pulsed with sats of 94% after ambulating to the room. She states she completed both her Augmentin and her prednisone. She states she felt much better after completing the antibiotic and prednisone . Then she started feeling worse after the medications were completed. She denies any fever now. She is using Mucinex and her flutter valve. Secretions are white to yellow in color. She uses her rescue inhaler at bedtime each night.   CXR 05/26/2021 No interval change in diffuse coarse interstitial changes and nodular opacities in the bilateral chest compatible with known chronic interstitial disease. Based on pattern of disease and extensive nature of disease of be difficult to exclude superimposed acute process.  CBC Latest Ref Rng & Units 10/14/2020 10/14/2020 10/14/2020  WBC 4.0 - 10.5 K/uL - - -  Hemoglobin 12.0 - 15.0 g/dL 14.6 14.3 14.6  Hematocrit 36.0 - 46.0 % 43.0 42.0 43.0  Platelets 150 - 400 K/uL - - -    BMP Latest Ref Rng & Units 10/14/2020 10/14/2020 10/14/2020  Glucose 70 - 99 mg/dL - - 101(H)  BUN 8 - 23 mg/dL - - 7(L)  Creatinine 0.44 - 1.00 mg/dL - - 0.40(L)  Sodium 135 - 145 mmol/L 141 141 140  Potassium 3.5 - 5.1 mmol/L 3.7 3.7 3.7  Chloride 98 - 111 mmol/L - - 100  CO2 22 - 32 mmol/L - - -  Calcium 8.9 - 10.3 mg/dL - - -    BNP    Component Value Date/Time   BNP 157.3 (H) 06/15/2016 1336    ProBNP    Component Value Date/Time   PROBNP 22.0 04/19/2017 1617    PFT    Component Value Date/Time   FEV1PRE 1.21  09/24/2020 1456   FEV1POST 1.46 07/19/2019 1554   FVCPRE 1.52 09/24/2020 1456   FVCPOST 1.87 07/19/2019 1554   TLC 3.63 07/19/2019 1554   DLCOUNC 5.95 09/24/2020 1456   PREFEV1FVCRT 80 09/24/2020 1456   PSTFEV1FVCRT 78 07/19/2019 1554    DG Chest 2 View  Result Date: 05/26/2021 CLINICAL DATA:  Dyspnea in an 81 year old female. EXAM: CHEST - 2 VIEW COMPARISON:  CT of the chest from April of 2022 and prior chest radiographs. FINDINGS: Cardiomediastinal contours and hilar structures are stable with mild cardiac enlargement. Diffuse coarse interstitial changes and nodular opacities in the bilateral chest show no change since previous imaging. There is no lobar consolidation. No sign of pleural effusion. Flattening of the LEFT and RIGHT hemidiaphragm. On limited assessment there is no acute skeletal process. IMPRESSION: No interval change in diffuse coarse interstitial changes and nodular opacities in the bilateral chest compatible with known chronic interstitial disease. Based on pattern of disease and extensive nature of disease of be difficult to exclude superimposed acute process. Electronically Signed   By: Zetta Bills M.D.   On: 05/26/2021 14:58     Past medical hx Past Medical History:  Diagnosis Date   Allergic rhinitis    takes Claritin nightly   Anxiety    but not on any meds   Arthritis  Asthma    Carpal tunnel syndrome    CHF (congestive heart failure) ([place])    patient denies-in office note 05/2015 by Aleene Davidson   GERD (gastroesophageal reflux disease)    History of bronchitis 3-18yr ago   History of colon polyps    benign   Joint pain    Peripheral edema    takes Lasix daily   Pneumonitis, hypersensitivity ([place] 05/2016   chronic hypersensitivity pneumonitis- Dr. MSpero Curb  Pulmonary hypertension ([place] 06/30/2017   patient denies-in office note from Dr. MSpero Curb   Shortness of breath dyspnea    with exertion;Albuterol inhaler as needed   Thyroid nodule       Social History   Tobacco Use   Smoking status: Never   Smokeless tobacco: Never  Vaping Use   Vaping Use: Never used  Substance Use Topics   Alcohol use: No    Alcohol/week: 0.0 standard drinks   Drug use: No    Ms.DWanatreports that she has never smoked. She has never used smokeless tobacco. She reports that she does not drink alcohol and does not use drugs.  Tobacco Cessation: Never smoker    Past surgical hx, Family hx, Social hx all reviewed.  Current Outpatient Medications on File Prior to Visit  Medication Sig   albuterol (PROVENTIL) (2.5 MG/3ML) 0.083% nebulizer solution 380m  albuterol (VENTOLIN HFA) 108 (90 Base) MCG/ACT inhaler Inhale 2 puffs into the lungs every 6 (six) hours as needed for wheezing or shortness of breath.   albuterol (VENTOLIN HFA) 108 (90 Base) MCG/ACT inhaler Inhale 2 puffs into the lungs every 6 (six) hours as needed for wheezing or shortness of breath.   amoxicillin-clavulanate (AUGMENTIN) 875-125 MG tablet Take 1 tablet by mouth 2 (two) times daily.   aspirin 325 MG tablet Take 650 mg by mouth daily as needed for moderate pain or headache.   azithromycin (ZITHROMAX) 250 MG tablet Take two today and then one daily until finished.   cefdinir (OMNICEF) 300 MG capsule cefdinir 300 mg capsule   celecoxib (CELEBREX) 100 MG capsule 1-2 capsule with food   cephALEXin (KEFLEX) 500 MG capsule 1 capsule   Cholecalciferol (DIALYVITE VITAMIN D 5000) 125 MCG (5000 UT) capsule Take 5,000 Units by mouth daily.   fluticasone (FLONASE) 50 MCG/ACT nasal spray Place 1 spray into both nostrils daily as needed for allergies.    fluticasone (FLONASE) 50 MCG/ACT nasal spray 1 spray in each nostril   furosemide (LASIX) 20 MG tablet 1 tablet   HYDROcodone bit-homatropine (HYDROMET) 5-1.5 MG/5ML syrup Take 5 mLs by mouth at bedtime as needed for cough.   ibuprofen (ADVIL) 200 MG tablet Take 200-400 mg by mouth every 6 (six) hours as needed for headache or moderate  pain.   loratadine (CLARITIN) 10 MG tablet 1 tablet   loratadine (CLARITIN) 5 MG chewable tablet Chew 5-10 mg by mouth daily as needed for allergies.    Multiple Vitamin (MULTIVITAMIN WITH MINERALS) TABS tablet Take 1 tablet by mouth daily.   Nintedanib (OFEV) 150 MG CAPS TAKE 1 CAPSULE BY MOUTH TWICE DAILY   nitroGLYCERIN (NITROSTAT) 0.4 MG SL tablet 1 tablet as directed   predniSONE (DELTASONE) 10 MG tablet Prednisone taper; 10 mg tablets: 4 tabs x 2 days, 3 tabs x 2 days, 2 tabs x 2 days 1 tab x 2 days then stop.   Respiratory Therapy Supplies (FLUTTER) DEVI Use as directed.   sodium chloride HYPERTONIC 3 % nebulizer solution Take 4 mLs by nebulization in  the morning and at bedtime.   vitamin B-12 (CYANOCOBALAMIN) 1000 MCG tablet Take 1,000 mcg by mouth every other day.   Vitamin D, Ergocalciferol, (DRISDOL) 1.25 MG (50000 UNIT) CAPS capsule 1 capsule   No current facility-administered medications on file prior to visit.     Allergies  Allergen Reactions   Adhesive [Tape] Itching and Rash   Latex Itching and Swelling   Doxycycline Rash   Sulfonamide Derivatives Other (See Comments)    Unknown    Review Of Systems:  Constitutional:   No  weight loss, night sweats,  Fevers, chills, fatigue, or  lassitude.  HEENT:   No headaches,  Difficulty swallowing,  Tooth/dental problems, or  Sore throat,                No sneezing, itching, ear ache, nasal congestion, post nasal drip,   CV:  No chest pain,  Orthopnea, PND, swelling in lower extremities, anasarca, dizziness, palpitations, syncope.   GI  No heartburn, indigestion, abdominal pain, nausea, vomiting, diarrhea, change in bowel habits, loss of appetite, bloody stools.   Resp: + shortness of breath with exertion less at rest.  + excess mucus, + productive cough,  + non-productive cough,  No coughing up of blood.  No change in color of mucus.  + wheezing.  No chest wall deformity  Skin: no rash or lesions.  GU: no dysuria, change  in color of urine, no urgency or frequency.  No flank pain, no hematuria   MS:  No joint pain or swelling.  No decreased range of motion.  No back pain.  Psych:  No change in mood or affect. No depression or anxiety.  No memory loss.   Vital Signs BP (!) 148/84 (BP Location: Left Arm, Patient Position: Sitting, Cuff Size: Normal)    Pulse 92    Temp 98.7 F (37.1 C) (Oral)    Ht _0  (1.702 m)    Wt 198 lb 3.2 oz (89.9 kg)    SpO2 93%    BMI 31.04 kg/m    Physical Exam:  General- No distress,  A&Ox3, pleasant ENT: No sinus tenderness, TM clear, pale nasal mucosa, no oral exudate,no post nasal drip, no LAN Cardiac: S1, S2, regular rate and rhythm, no murmur Chest: + wheeze/no  rales/ + dullness; no accessory muscle use, no nasal flaring, no sternal retractions Abd.: Soft Non-tender, ND, BS +, Body mass index is 31.04 kg/m. Ext: No clubbing cyanosis, edema Neuro:  normal strength, MAE x 4, A&O x 3 Skin: No rashes, warm and dry, No lesions  Psych: normal mood and behavior   Assessment/Plan Slow to resolve ILD flare Treated with Augmentin and Pred taper 12/21 Cough  Plan We will prescribe another prednisone taper for your cough and shortness of breath.  Prednisone taper; 10 mg tablets: 4 tabs x 3 days, 3 tabs x 3 days, 2 tabs x 3 days 1 tab x 3 days then stop.  We will order a HRCT of the chest. You will get a call to get this scheduled.  We will call you with results Follow up in 2 weeks to review the results and see how you are doing. ( CT must be done before follow up appointment)  Continue Flutter Valve 2 times daily with Mucinex.  Add Claritin or Zyrtec daily for post nasal drip.  Please contact office for sooner follow up if symptoms do not improve or worsen or seek emergency care   Use 3% saline nebs as  needed for chest congestion   I spent 45 minutes dedicated to the care of this patient on the date of this encounter to include pre-visit review of records, face-to-face  time with the patient discussing conditions above, post visit ordering of testing, clinical documentation with the electronic health record, making appropriate referrals as documented, and communicating necessary information to the patient's healthcare team.   06/14/2021  11:57 AM

## 2021-06-15 ENCOUNTER — Other Ambulatory Visit: Payer: Self-pay | Admitting: Acute Care

## 2021-06-15 ENCOUNTER — Other Ambulatory Visit: Payer: Self-pay | Admitting: *Deleted

## 2021-06-15 ENCOUNTER — Telehealth: Payer: Self-pay | Admitting: Acute Care

## 2021-06-15 DIAGNOSIS — R0600 Dyspnea, unspecified: Secondary | ICD-10-CM

## 2021-06-15 MED ORDER — PREDNISONE 10 MG PO TABS: ORAL_TABLET | ORAL | 0 refills | Status: DC

## 2021-06-15 MED ORDER — SODIUM CHLORIDE 3 % IN NEBU: INHALATION_SOLUTION | Freq: Two times a day (BID) | RESPIRATORY_TRACT | 1 refills | Status: DC

## 2021-06-15 NOTE — Telephone Encounter (Signed)
Prednisone prescription sent to [facility] with #30, no refills.  Nothing further at this time.  06/14/21 Sarah, NP  Instructions   Return in about 2 weeks (around 06/28/2021), or if symptoms worsen or fail to improve. It is good to see you today. We will prescribe another prednisone taper for your cough and shortness of breath.  Prednisone taper; 10 mg tablets: 4 tabs x 3 days, 3 tabs x 3 days, 2 tabs x 3 days 1 tab x 3 days then stop.  We will order a HRCT of the chest. You will get a call to get this scheduled.  We will call you with results Follow up in 2 weeks to review the results and see how you are doing. ( CT must be done before follow up appointment)  Continue Flutter Valve 2 times daily with Mucinex.  Add Claritin or Zyrtec daily for post nasal drip.  Please contact office for sooner follow up if symptoms do not improve or worsen or seek emergency care   Use 3% saline nebs as needed for chest congestion

## 2021-06-22 ENCOUNTER — Ambulatory Visit (HOSPITAL_COMMUNITY)
Admission: RE | Admit: 2021-06-22 | Discharge: 2021-06-22 | Disposition: A | Discharge status: Home / Self Care | Payer: Medicare Other | Source: Ambulatory Visit | Attending: Acute Care | Admitting: Acute Care

## 2021-06-22 ENCOUNTER — Other Ambulatory Visit: Payer: Self-pay

## 2021-06-22 DIAGNOSIS — I700 Atherosclerosis of aorta: Secondary | ICD-10-CM | POA: Diagnosis not present

## 2021-06-22 DIAGNOSIS — J84112 Idiopathic pulmonary fibrosis: Secondary | ICD-10-CM | POA: Diagnosis not present

## 2021-06-22 DIAGNOSIS — J479 Bronchiectasis, uncomplicated: Secondary | ICD-10-CM | POA: Diagnosis not present

## 2021-06-22 DIAGNOSIS — I2510 Atherosclerotic heart disease of native coronary artery without angina pectoris: Secondary | ICD-10-CM | POA: Diagnosis not present

## 2021-06-22 DIAGNOSIS — R0600 Dyspnea, unspecified: Secondary | ICD-10-CM | POA: Diagnosis not present

## 2021-06-28 ENCOUNTER — Other Ambulatory Visit: Payer: Self-pay

## 2021-06-28 ENCOUNTER — Encounter: Payer: Self-pay | Admitting: Acute Care

## 2021-06-28 ENCOUNTER — Ambulatory Visit (INDEPENDENT_AMBULATORY_CARE_PROVIDER_SITE_OTHER): Payer: Medicare Other | Admitting: Acute Care

## 2021-06-28 ENCOUNTER — Telehealth: Payer: Self-pay | Admitting: Acute Care

## 2021-06-28 VITALS — BP 140/76 | HR 91 | Temp 98.5°F | Ht 66.0 in | Wt 201.4 lb

## 2021-06-28 DIAGNOSIS — J849 Interstitial pulmonary disease, unspecified: Secondary | ICD-10-CM

## 2021-06-28 DIAGNOSIS — J479 Bronchiectasis, uncomplicated: Secondary | ICD-10-CM | POA: Diagnosis not present

## 2021-06-28 LAB — HEPATIC FUNCTION PANEL
ALT: 14 U/L (ref 0–35)
AST: 15 U/L (ref 0–37)
Albumin: 3.7 g/dL (ref 3.5–5.2)
Alkaline Phosphatase: 58 U/L (ref 39–117)
Bilirubin, Direct: 0 mg/dL (ref 0.0–0.3)
Total Bilirubin: 0.4 mg/dL (ref 0.2–1.2)
Total Protein: 6.8 g/dL (ref 6.0–8.3)

## 2021-06-28 MED ORDER — SODIUM CHLORIDE 3 % IN NEBU: INHALATION_SOLUTION | RESPIRATORY_TRACT | 12 refills | Status: DC | PRN

## 2021-06-28 NOTE — Telephone Encounter (Signed)
Please call patient and let her know her liver function studies are normal. Thanks so much

## 2021-06-28 NOTE — Progress Notes (Signed)
History of Present Illness [name] is a 81 y.o. female  never smoker with bronchiectasis and ILD. She is followed by Dr. Chase Caller   06/28/2021 Pt. Was seen 05/26/2021 for ILD flare vs Respiratory viral illness/ vs Bronchiectasis. She had grandchildren who had RSV, and we suspect this may have been the cause. CXR revealed stable ILD and difficult to exclude acute process. She was treated with Augmentin and prednisone taper. At follow up 06/14/2021 she stated she was still short of breath. We re-treated her with prednisone, and did an HRCT chest, in addition to adding a flutter valve. Additionally we added claritin or Zyrtec daily. And instructed her to use 3% saline for chest congestion. 3% saline   She returns today. She states she has been doing well. She is better than she has been, but does not feel she is at her baseline. She was unable to get 3% saline and it is back ordered. She denies any fever , chest pain, or orthopnea. Secretions are clear. She denies wheezing. No ankle swelling. She is taking OFEV and states she is doing well. She has not had any issues with GI upset. She has noted dark yellow urine. She has not had Hepatic panel since 09/2020, will check one today.    She has been sick since December. Her son is concerned about how she has been less mobile. We will refer for Pulmonary rehab. She has done this in the past, and she has received tremendous benefit from this.   Test Results: Hepatic Panel 06/28/2021>> Pending    CXR 05/26/2021 No interval change in diffuse coarse interstitial changes and nodular opacities in the bilateral chest compatible with known chronic interstitial disease. Based on pattern of disease and extensive nature of disease of be difficult to exclude superimposed acute process.  CBC Latest Ref Rng & Units 10/14/2020 10/14/2020 10/14/2020  WBC 4.0 - 10.5 K/uL - - -  Hemoglobin 12.0 - 15.0 g/dL 14.6 14.3 14.6  Hematocrit 36.0 - 46.0 % 43.0 42.0  43.0  Platelets 150 - 400 K/uL - - -    BMP Latest Ref Rng & Units 10/14/2020 10/14/2020 10/14/2020  Glucose 70 - 99 mg/dL - - 101(H)  BUN 8 - 23 mg/dL - - 7(L)  Creatinine 0.44 - 1.00 mg/dL - - 0.40(L)  Sodium 135 - 145 mmol/L 141 141 140  Potassium 3.5 - 5.1 mmol/L 3.7 3.7 3.7  Chloride 98 - 111 mmol/L - - 100  CO2 22 - 32 mmol/L - - -  Calcium 8.9 - 10.3 mg/dL - - -    BNP    Component Value Date/Time   BNP 157.3 (H) 06/15/2016 1336    ProBNP    Component Value Date/Time   PROBNP 22.0 04/19/2017 1617    PFT    Component Value Date/Time   FEV1PRE 1.21 09/24/2020 1456   FEV1POST 1.46 07/19/2019 1554   FVCPRE 1.52 09/24/2020 1456   FVCPOST 1.87 07/19/2019 1554   TLC 3.63 07/19/2019 1554   DLCOUNC 5.95 09/24/2020 1456   PREFEV1FVCRT 80 09/24/2020 1456   PSTFEV1FVCRT 78 07/19/2019 1554    CT CHEST HIGH RESOLUTION  Result Date: 06/23/2021 CLINICAL DATA:  Interstitial lung disease. EXAM: CT CHEST WITHOUT CONTRAST TECHNIQUE: Multidetector CT imaging of the chest was performed following the standard protocol without intravenous contrast. High resolution imaging of the lungs, as well as inspiratory and expiratory imaging, was performed. RADIATION DOSE REDUCTION: This exam was performed according to the departmental dose-optimization program which  includes automated exposure control, adjustment of the mA and/or kV according to patient size and/or use of iterative reconstruction technique. COMPARISON:  09/29/2020, 10/24/2019 and 04/09/2018. FINDINGS: Cardiovascular: Atherosclerotic calcification of the aorta, aortic valve and coronary arteries. Pulmonic trunk and heart are enlarged. No pericardial effusion. Mediastinum/Nodes: Low-attenuation right thyroid nodule measures approximately 2.0 cm. Streak artifact from a right shoulder arthroplasty degrades image quality in this area. No pathologically enlarged mediastinal or axillary lymph nodes. Hilar regions are difficult to evaluate  without IV contrast. Esophagus is grossly unremarkable. Lungs/Pleura: Traction bronchiectasis/bronchiolectasis, architectural distortion, subpleural reticular densities and ground-glass, similar to prior exams. No definite zonal predominance. Biopsy suture line in the inferior aspect of the anterior segment right upper lobe. No pleural fluid. Airway is unremarkable. There is air trapping. Upper Abdomen: Visualized portion of the liver is unremarkable. Stones in the gallbladder. Visualized portions of the adrenal glands, kidneys, spleen, pancreas, stomach and bowel are unremarkable with the exception of a small hiatal hernia. No upper abdominal adenopathy. Musculoskeletal: Degenerative changes in the spine. Right shoulder arthroplasty. No worrisome lytic or sclerotic lesions. IMPRESSION: 1. 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 Findings are suggestive of an alternative diagnosis (not UIP) per consensus guidelines: Diagnosis of Idiopathic Pulmonary Fibrosis: An Official ATS/ERS/JRS/ALAT Clinical Practice Guideline. Russell, Iss 5, [phone], Feb 04 2017. 2. Cholelithiasis. 3. 2.0 cm low-attenuation right thyroid nodule. Recommend thyroid ultrasound. (Ref: J Am Coll Radiol. 2015 Feb;12(2): 143-50). 4. Aortic atherosclerosis (ICD10-I70.0). Coronary artery calcification. 5. Enlarged pulmonic trunk, indicative of pulmonary arterial hypertension. Electronically Signed   By: Lorin Picket M.D.   On: 06/23/2021 10:44     Past medical hx Past Medical History:  Diagnosis Date   Allergic rhinitis    takes Claritin nightly   Anxiety    but not on any meds   Arthritis    Asthma    Carpal tunnel syndrome    CHF (congestive heart failure) ([place])    patient denies-in office note 05/2015 by Aleene Davidson   GERD (gastroesophageal reflux disease)    History of bronchitis 3-15yr ago    History of colon polyps    benign   Joint pain    Peripheral edema    takes Lasix daily   Pneumonitis, hypersensitivity ([place] 05/2016   chronic hypersensitivity pneumonitis- Dr. MSpero Curb  Pulmonary hypertension ([place] 06/30/2017   patient denies-in office note from Dr. MSpero Curb   Shortness of breath dyspnea    with exertion;Albuterol inhaler as needed   Thyroid nodule      Social History   Tobacco Use   Smoking status: Never   Smokeless tobacco: Never  Vaping Use   Vaping Use: Never used  Substance Use Topics   Alcohol use: No    Alcohol/week: 0.0 standard drinks   Drug use: No    Ms.DDonlanreports that she has never smoked. She has never used smokeless tobacco. She reports that she does not drink alcohol and does not use drugs.  Tobacco Cessation: Never smoker   Past surgical hx, Family hx, Social hx all reviewed.  Current Outpatient Medications on File Prior to Visit  Medication Sig   albuterol (PROVENTIL) (2.5 MG/3ML) 0.083% nebulizer solution 315m  albuterol (VENTOLIN HFA) 108 (90 Base) MCG/ACT inhaler Inhale 2 puffs into the lungs every 6 (six) hours as needed for wheezing or shortness of breath.   albuterol (VENTOLIN HFA) 108 (90 Base) MCG/ACT inhaler  Inhale 2 puffs into the lungs every 6 (six) hours as needed for wheezing or shortness of breath.   amoxicillin-clavulanate (AUGMENTIN) 875-125 MG tablet Take 1 tablet by mouth 2 (two) times daily.   aspirin 325 MG tablet Take 650 mg by mouth daily as needed for moderate pain or headache.   azithromycin (ZITHROMAX) 250 MG tablet Take two today and then one daily until finished.   cefdinir (OMNICEF) 300 MG capsule cefdinir 300 mg capsule   celecoxib (CELEBREX) 100 MG capsule 1-2 capsule with food   cephALEXin (KEFLEX) 500 MG capsule 1 capsule   Cholecalciferol (DIALYVITE VITAMIN D 5000) 125 MCG (5000 UT) capsule Take 5,000 Units by mouth daily.   fluticasone (FLONASE) 50 MCG/ACT nasal spray Place 1 spray into both  nostrils daily as needed for allergies.    fluticasone (FLONASE) 50 MCG/ACT nasal spray 1 spray in each nostril   furosemide (LASIX) 20 MG tablet 1 tablet   HYDROcodone bit-homatropine (HYDROMET) 5-1.5 MG/5ML syrup Take 5 mLs by mouth at bedtime as needed for cough.   ibuprofen (ADVIL) 200 MG tablet Take 200-400 mg by mouth every 6 (six) hours as needed for headache or moderate pain.   loratadine (CLARITIN) 10 MG tablet 1 tablet   loratadine (CLARITIN) 5 MG chewable tablet Chew 5-10 mg by mouth daily as needed for allergies.    Multiple Vitamin (MULTIVITAMIN WITH MINERALS) TABS tablet Take 1 tablet by mouth daily.   Nintedanib (OFEV) 150 MG CAPS TAKE 1 CAPSULE BY MOUTH TWICE DAILY   nitroGLYCERIN (NITROSTAT) 0.4 MG SL tablet 1 tablet as directed   predniSONE (DELTASONE) 10 MG tablet Prednisone taper; 10 mg tablets: 4 tabs x 2 days, 3 tabs x 2 days, 2 tabs x 2 days 1 tab x 2 days then stop.   predniSONE (DELTASONE) 10 MG tablet Prednisone taper; 10 mg tablets: 4 tabs x 3 days, 3 tabs x 3 days, 2 tabs x 3 days 1 tab x 3 days then stop.   Respiratory Therapy Supplies (FLUTTER) DEVI Use as directed.   sodium chloride HYPERTONIC 3 % nebulizer solution Take by nebulization in the morning and at bedtime.   vitamin B-12 (CYANOCOBALAMIN) 1000 MCG tablet Take 1,000 mcg by mouth every other day.   Vitamin D, Ergocalciferol, (DRISDOL) 1.25 MG (50000 UNIT) CAPS capsule 1 capsule   No current facility-administered medications on file prior to visit.     Allergies  Allergen Reactions   Adhesive [Tape] Itching and Rash   Latex Itching and Swelling   Doxycycline Rash   Sulfonamide Derivatives Other (See Comments)    Unknown    Review Of Systems:  Constitutional:   No  weight loss, night sweats,  Fevers, chills, ++ fatigue, or  lassitude.  HEENT:   No headaches,  Difficulty swallowing,  Tooth/dental problems, or  Sore throat,                No sneezing, itching, ear ache, nasal congestion, post nasal  drip,   CV:  No chest pain,  Orthopnea, PND, swelling in lower extremities, anasarca, dizziness, palpitations, syncope.   GI  No heartburn, indigestion, abdominal pain, nausea, vomiting, diarrhea, change in bowel habits, loss of appetite, bloody stools.   Resp: + shortness of breath with exertion less at rest.  No excess mucus, no productive cough,  + non-productive cough,  No coughing up of blood.  No change in color of mucus.  No wheezing.  No chest wall deformity  Skin: no rash or lesions.  GU: no dysuria, change in color of urine, no urgency or frequency.  No flank pain, no hematuria   MS:  No joint pain or swelling.  + decreased range of motion.  No back pain.  Psych:  No change in mood or affect. No depression or anxiety.  No memory loss.   Vital Signs BP 140/76 (BP Location: Left Arm, Patient Position: Sitting, Cuff Size: Normal)    Pulse 91    Temp 98.5 F (36.9 C) (Oral)    Ht _0  (1.676 m)    Wt 201 lb 6.4 oz (91.4 kg)    SpO2 93%    BMI 32.51 kg/m    Physical Exam:  General- No distress,  A&O x 3, pleasant  ENT: No sinus tenderness, TM clear, pale nasal mucosa, no oral exudate,no post nasal drip, no LAN Cardiac: S1, S2, regular rate and rhythm, no murmur Chest: No wheeze/ rales/ dullness; no accessory muscle use, no nasal flaring, no sternal retractions, Crackles noted per bases bilaterally.  Abd.: Soft Non-tender, ND, BS +, Body mass index is 32.51 kg/m. Ext: No clubbing cyanosis, edema Neuro:  normal strength Skin: No rashes, warm and dry Psych: normal mood and behavior   Assessment/Plan  Slow to resolve ILD flare/ Upper Resp Virus/ Bronchiectasis Treated with Augmentin and Pred taper 12/21 Re-treated with pred taper 06/14/2021 Cough  Plan We will place an order for Pulmonary Rehab ( Dr. Chase Caller) >> ILD Flare with slow recovery.  Sips of water instead of throat clearing Sugar Free [employer] or [employer] originals for throat soothing. Delsym Cough  syrup 5 cc's every 12 hours Non-sedating antihistamine of your choice daily ( Zyrtec, Allegra, Xyzol, Claritin ( Generic ok)  Continue Flutter Valve 2 times daily with Mucinex.  Add Claritin or Zyrtec daily for post nasal drip.  Continue OFEV  as you have been doing.  Please contact office for sooner follow up if symptoms do not improve or worsen or seek emergency care   Use 3% saline nebs as needed for chest congestion as you have been doing.  Follow up appointment with Cardiology as needed  We will check Hepatic panel as you have not had LFT's since 09/2020.  Follow up with Dr. Chase Caller in 3 months.  Please contact office for sooner follow up if symptoms do not improve or worsen or seek emergency care    I spent 45 minutes dedicated to the care of this patient on the date of this encounter to include pre-visit review of records, face-to-face time with the patient discussing conditions above, post visit ordering of testing, clinical documentation with the electronic health record, making appropriate referrals as documented, and communicating necessary information to the patient's healthcare team.   Magdalen Spatz, NP 06/28/2021  1:57 PM

## 2021-06-28 NOTE — Patient Instructions (Addendum)
It is good to see you today. We will place an order for Pulmonary Rehab ( Dr. Chase Caller) >> ILD Flare with slow recovery.  Sips of water instead of throat clearing Sugar Free [employer] or [employer] originals for throat soothing. Delsym Cough syrup 5 cc's every 12 hours Non-sedating antihistamine of your choice daily ( Zyrtec, Allegra, Xyzol, Claritin ( Generic ok)  Continue Flutter Valve 2 times daily with Mucinex.  Add Claritin or Zyrtec daily for post nasal drip.  Continue OFEV  as you have been doing.  Please contact office for sooner follow up if symptoms do not improve or worsen or seek emergency care   Use 3% saline nebs as needed for chest congestion as you have been doing.  Follow up appointment with Cardiology as needed  We will check Hepatic panel as you have not had LFT's since 09/2020.  Follow up with Dr. Chase Caller in 3 months.  Please contact office for sooner follow up if symptoms do not improve or worsen or seek emergency care

## 2021-06-30 NOTE — Telephone Encounter (Signed)
I called the patient and she voices understanding. Nothing further needed.

## 2021-07-01 ENCOUNTER — Other Ambulatory Visit: Payer: Self-pay

## 2021-07-01 ENCOUNTER — Encounter: Payer: Medicare Other | Admitting: *Deleted

## 2021-07-01 DIAGNOSIS — J849 Interstitial pulmonary disease, unspecified: Secondary | ICD-10-CM

## 2021-07-01 DIAGNOSIS — Z006 Encounter for examination for normal comparison and control in clinical research program: Secondary | ICD-10-CM

## 2021-07-05 ENCOUNTER — Telehealth: Payer: Self-pay | Admitting: Internal Medicine

## 2021-07-05 NOTE — Telephone Encounter (Cosign Needed)
Subject, [name], has had consistent issues with using study device and wearing activity monitor 24 hours a day. She has been re-educated at research visits about importance of using these for the study.   Please review for further recommendations.

## 2021-07-05 NOTE — Telephone Encounter (Signed)
Called and left message I will call her back 07/06/21

## 2021-07-05 NOTE — Research (Signed)
Disclaimer: This blurb is a brief overview of the study this patient is participating in. It is for the use of providers caring for the patient. It is not a regulatory declaration, and may or may not reflect all elements of the protocol.  Title:  A Randomized, double-blind, placebo-controlled dose escalation and verification clinical study, to assess the safety and efficacy of pulsed, inhaled nitric oxide (iNO) in subjects at risk of pulmonary hypertension associated with pulmonary fibrosis on long term oxygen therapy (Part 1 and Part 2). Primary Endpoint: The placebo-corrected change for INOpulse in minutes of moderate to vigorous physical activity (MVPA) measured by actigraphy from baseline to month 4.  [facility] [place] [facility] Coordinator note :   This visit for Subject GARLAND SMOUSE with DOB: 1941/03/21 on 07/01/2021 for the above protocol is Visit/Encounter # 3 OLE and is for purpose of research.   The consent for this encounter is under Protocol Version: Amendment 7 dated 25Aug 2021  IB: version 11.0 approved 20 Jan 2020  ICF: cohort 3 approved [id number] Subject expressed continued interest and consent in continuing as a study subject. Subject confirmed that there was  no change in contact information (e.g. address, telephone, email). Subject thanked for participation in research and contribution to science.   The Subject was informed that the PI Dr. Chase Caller continues to have oversight of the subject's visits and course  through relevant discussions, reviews and also specifically of this visit by routing of this note to the [facility].  All procedures completed per the above mentioned protocol. Subject re-educated on the importance of using the INOpulse device at least 12 hours a day per protocol. Subject stated understanding. Patient has rolled over into Open Label Extension and is knowingly taking study drug. Patient's devices were reprogrammed during this visit. Refer to the  subjects paper source binder for further details of the visit.    Signed by [place] Assistant [facility] [place], [place] 12:01 PM 07/05/2021

## 2021-07-06 NOTE — Telephone Encounter (Signed)
Patient is returning phone call. Patient phone number is [phone] h or [phone].

## 2021-07-06 NOTE — Telephone Encounter (Signed)
Patient is returning Dr. Golden Pop call.  She will be available today. Best contact number [phone].  Dr. Chase Caller, please advise. Thanks

## 2021-07-06 NOTE — Telephone Encounter (Signed)
PI phone call  - she lost watch and now has gotten spare. She admitted to non-compliance with iNO device. Educated her to be 100% compliant with device cannot be losing device. Educated her about device and data integrity need. She has agreed to be compliant    SIGNATURE    Dr. Brand Males, M.D., F.C.C.P,  Pulmonary and Critical Care Medicine Staff Physician, [facility] Director - Interstitial Lung Disease  Program  Pulmonary [place] at [place], [place], [address]  NPI Number:  NPI #[id number] [facility] Number: [id number]  Pager: [phone], If no answer  -> Check AMION or Try [phone] Telephone (clinical office): [phone] Telephone (research): [phone]  1:55 PM 07/06/2021

## 2021-08-25 ENCOUNTER — Telehealth: Payer: Self-pay | Admitting: Internal Medicine

## 2021-08-25 ENCOUNTER — Other Ambulatory Visit: Payer: Self-pay

## 2021-08-25 ENCOUNTER — Encounter: Payer: Self-pay | Admitting: Internal Medicine

## 2021-08-25 ENCOUNTER — Encounter: Payer: Medicare Other | Admitting: *Deleted

## 2021-08-25 ENCOUNTER — Encounter (INDEPENDENT_AMBULATORY_CARE_PROVIDER_SITE_OTHER): Payer: Medicare Other | Admitting: Internal Medicine

## 2021-08-25 DIAGNOSIS — J209 Acute bronchitis, unspecified: Secondary | ICD-10-CM

## 2021-08-25 DIAGNOSIS — J849 Interstitial pulmonary disease, unspecified: Secondary | ICD-10-CM

## 2021-08-25 DIAGNOSIS — Z006 Encounter for examination for normal comparison and control in clinical research program: Secondary | ICD-10-CM

## 2021-08-25 DIAGNOSIS — R634 Abnormal weight loss: Secondary | ICD-10-CM

## 2021-08-25 MED ORDER — AMOXICILLIN 500 MG PO CAPS: 500.0000 mg | ORAL_CAPSULE | Freq: Three times a day (TID) | ORAL | 0 refills | Status: DC

## 2021-08-25 MED ORDER — PREDNISONE 10 MG PO TABS: ORAL_TABLET | ORAL | 0 refills | Status: AC

## 2021-08-25 NOTE — Patient Instructions (Addendum)
Research subject ? ?- respect decision to stop iNO device open label extension due to foul taste and come off pulse study ? ?Plan ? - no further followup for this research ? - per protocol [facility] wil follow vital status ? ? ?ILD (interstitial lung disease) ([place]) ?CHronic resp failure with hypoxemia ? ? - PFT wise stable from summer 2022 through JAn 2023 but progressive over 2 years ? ?Plan ? - will get radiology to do progression comparison on Jan 2023 CT to past ?- continue ofev ? - continue o2 3-4LNC ?- research labs for LFT ?- take ILD-PRO registry consent ? ?Weight loss, abnormal - prob due to ofev and unintentiona ? ?Plan ? -weight today by research CRC ?- respect decision to continue ofev ? ?Acute bronchitis, unspecified organism - slow to resolve ? ?Plan (share decision making) ? - amoxcillin 577m three times daily  x 8 days ? - Please take prednisone 40 mg x1 day, then 30 mg x1 day, then 20 mg x1 day, then 10 mg x1 day, and then 5 mg x1 day and stop ? ?Followup ? - spiro/dlco in 8-12 weeks ? - Tanay Misuraca visit - 30 min in 8-12 weeks ? ?

## 2021-08-25 NOTE — Telephone Encounter (Signed)
? ? ?  For radiology addendum:  ? - please call radiologist assistant line [phone] and specify [name] , Oct 25, 1940 and [id number] ?- mention imaging type HRCT and date Jan 2023 ?- request addendum for purpose of - since when is it progressing? ? ? ?Please send phone message back when done ? ?Thanks ? ? ? ?SIGNATURE  ? ? ?Dr. Brand Males, M.D., F.C.C.P,  ?Pulmonary and Critical Care Medicine ?Staff Physician, [place] ?Center Director - Interstitial Lung Disease  Program  ?Pulmonary [place] [facility] ?[place], [place], [address] ? ?Pager: [phone], If no answer  OR between  19:00-7:00h: page [phone]  [phone] ?Telephone (clinical office): [phone] ?Telephone (research): [phone] ? ?5:13 PM ?08/25/2021 ? ?

## 2021-08-25 NOTE — Telephone Encounter (Signed)
Pt had a research visit today 08/25/21 with MR. MR wants pt to follow up with  him in about 8-12 weeks having 81mn PFT prior to [place] ? ?Patient has an OV scheduled with MR already on 09/28/21 at 11:30. We need to have pt be scheduled for PFT prior to that [place] ? ?Attempted to call pt to see if I could get this scheduled but unable to reach. Unable to leave VM. ? ?Routing to front desk pool to try to help get the PFT scheduled. Needs to be 387m PFT ?

## 2021-08-25 NOTE — Progress Notes (Signed)
? ?xxxxxxxxxxxxxxxxxxxxx ? ?OV 07/29/2019 -transfer of care to Dr. Chase Caller interstitial lung disease center. ? ?Subjective:  ?Patient ID: [name], female , DOB: 1940/11/27 , age 81 y.o. , MRN: [id number] , ADDRESS: [address] ?[place] [address] ? ? ?07/29/2019 -   ?Chief Complaint  ?Patient presents with  ? Follow-up  ?  Pt states her breathing has become worse since last visit. States she also has an occ cough.  ? ?#Interstitial lung disease (progressive) with chronic hypoxemic respiratory failure - chronic HP dx ?-Per [facility] September 2016: Disease present at least since August 01, 2006 ? - Surgical lung biopsy  November 20, 2014 at [place]:  ? - local opinion: diagnostic of usual interstitial pneumonia (UIP). Interestingly, there are ?scattered non-necrotizing granulomata in the pleura. ? -Second opinion September 2016 at [facility]: pathology was reviewed at our multidisciplinary ILD conference and noted to have a variegated pattern of fibrosis with some airway centricity, granulomas noted both near the airway and more peripherally. Minimal inflammation was noted. Features not c/w with UIP. Few giant cells with cholesterol clefts. Overall pattern most in keeping with chronic HP ? ?-High-resolution CT chest including April 09, 2018: Lack of craniocaudal gradient and significant air trapping suggestive of chronic hypersensitivity pneumonitis [September 08-01-14 [facility] evaluation thought CT was inconsistent with UIP] ? ?-Clinical diagnosis of chronic hypersensitivity pneumonitis  ? - dx given Sept 2016 at [place] ? -No clear-cut exposure identified at [facility] evaluation September 2016 by Dr. Dorothyann Peng ? -Steroids recommended but patient preferred not to take it September 2016 at [facility] ? ?-Treatment  ? - deferred steroids sept Aug 01, 2014 visit at [facility] with Dr Sherri Sear ? - nintedanib since March 2020 ? ? ?#Small hiatal hernia with acid reflux disease ? ?HPI ?[name] 81 y.o. -is a transfer  of care from Dr. Lake Bells to Dr. Chase Caller with interstitial lung disease questionnaire.  She was last seen by Dr. Lake Bells in March 2020.  After the onset of the COVID-19 pandemic Dr. Lake Bells is now fully in charge of the Covid hospital [facility].  Therefore he is not in the outpatient medical practice anymore.  Therefore patient has been transferred to the ILD center to Dr. Chase Caller myself.  Is the first time I am meeting her and getting to know her.  She tells me that I took care of her husband for COPD a few years ago.  This was her ex-husband.  He passed away in 2011/08/02 after hospitalization to the ICU under our service.  She is grateful for the care. ? ?She tells me that she has interstitial lung disease and for the last year she has been on nintedanib.  Currently she is having difficulty with nintedanib supply.  She only has 2 weeks left.  She uses 3 L of oxygen at rest.  She says she has been doing that for the last 4 years.  It is stable usage.  I am not really sure what her pulse ox on room air would be.  She says occasionally she would crank it up to 4 L for exertion but she generally uses 3 L both at rest and with exertion.  She says she tolerates nintedanib fine. Her interstitial lung disease history was reviewed and summarized above.  It appears that she was not keen on taking prednisone and her 01-Aug-2014 visit with Dr. Dorothyann Peng at [facility].  Therefore once the INBUILD data was available she was started on nintedanib a year ago.She  wants to have a son involved in her visits.  Because of the COVID-19 pandemic he is waiting outside in the car.  She says at the next visit she wants to bring him in. ? ? ?Her symptom score is listed below.  Her progressive lung function decline is also listed below and the pulmonary function test reviewed.  I personally reviewed and visualized and interpreted those.  Her DLCO is paradoxically higher.  I think this is an error. ? ? ?ROS ?- per HPI ? ?OV  10/03/2019 ? ?Subjective:  ?Patient ID: [name], female , DOB: Sep 01, 1940 , age 81 y.o. , MRN: [id number] , ADDRESS: [place] ?[place] [address] ? ? ?#Interstitial lung disease (progressive) with chronic hypoxemic respiratory failure - chronic HP dx ?-Per [facility] September 2016: Disease present at least since 2008 ? - Surgical lung biopsy  November 20, 2014 at [place]:  ? - local opinion: diagnostic of usual interstitial pneumonia (UIP). Interestingly, there are ?scattered non-necrotizing granulomata in the pleura. ? -Second opinion September 2016 at [facility]: pathology was reviewed at our multidisciplinary ILD conference and noted to have a variegated pattern of fibrosis with some airway centricity, granulomas noted both near the airway and more peripherally. Minimal inflammation was noted. Features not c/w with UIP. Few giant cells with cholesterol clefts. Overall pattern most in keeping with chronic HP ? ?-High-resolution CT chest including April 09, 2018: Lack of craniocaudal gradient and significant air trapping suggestive of chronic hypersensitivity pneumonitis [September 2016 [facility] evaluation thought CT was inconsistent with UIP] ? ?-Clinical diagnosis of chronic hypersensitivity pneumonitis  ? - dx given Sept 2016 at [place] ? -No clear-cut exposure identified at [facility] evaluation September 2016 by Dr. Dorothyann Peng ? -Steroids recommended but patient preferred not to take it September 2016 at [facility] ? ?-Treatment  ? - deferred steroids sept 2016 visit at [facility] with Dr Sherri Sear ? - nintedanib since March 2020 ? ? ?#Small hiatal hernia with acid reflux disease ? ?10/03/2019 -   ?Chief Complaint  ?Patient presents with  ? Follow-up  ?  SOB and cough with activity has increasd, Productive cough with yellow sputum   ? ? ?HPI ?[name] 81 y.o. -returns for follow-up with her son.  At this point in time the focus is to establish the true nature of interstitial lung  disease.  She has brought her son with her.  However approximately 2 weeks ago she had acute bronchitis symptoms and called in.  Was given a cephalexin 5 days and prednisone 5 days.  These have helped immensely.  However she feels not back to baseline.  She feels she will benefit from another round of antibiotic with or without prednisone.  She finished ILD questionnaire and it is listed below.  We have not been able to discuss in a multidisciplinary case conference of 4. ?[place] Integrated Comprehensive ILD Questionnaire ? ?Symptoms: See below. ? ? ? ?SYMPTOM SCALE - ILD 07/29/2019 ? 10/03/2019 ?  ?O2 use 3L at rest, occ 4L with exertion 3L at rest and 4L with exertion. Acute bronchitis 2 weeks ago  ?Shortness of Breath 0 -> 5 scale with 5 being worst (score 6 If unable to do)   ?At rest 0 1  ?Simple tasks - showers, clothes change, eating, shaving 1 2  ?Household (dishes, doing bed, laundry) 2 3  ?Shopping 2 2  ?Walking level at own pace 1 2  ?Walking up Stairs 6 4  ?Total (30-36) Dyspnea  Score 12 14  ?How bad is your cough? 1 3.5  ?How bad is your fatigue 0 3.5  ?How bad is nausea 0 0  ?How bad is vomiting? ? 0 0  ?How bad is diarrhea? 0 0  ?How bad is anxiety? 1 1.5  ?How bad is depression 1 1  ? ? ? ?Past Medical History :  -Denies any collagen vascular disease or vasculitis.  Denies any knowledge of prior pulmonary hypertension.  Denies diabetes or thyroid disease.  Denies any stroke.  Denies mononucleosis denies tuberculosis denies pneumonia denies blood clots denies heart disease denies pleurisy ? ? ?ROS: Positive for shoulder pain for the last several years but otherwise no dry eyes.  No Raynaud's.  No weight loss.  No nausea no vomiting no rash ? ? ?FAMILY HISTORY of LUNG DISEASE: Denies any pulmonary fibrosis or COPD or cystic fibrosis of hypersensitive pneumonitis or any other lung disease. ? ? ?EXPOSURE HISTORY: Never smoked cigarettes.  No passive smoking.  No marijuana.  No vaping no cocaine no  intravenous drug use. ? ? ?HOME and HOBBY DETAILS : Current home is 25 years she is lived there for 5 years.  [place].  No mold or mildew no feather pillow.  No steam iron use no Ja

## 2021-08-25 NOTE — Telephone Encounter (Signed)
Pt was still in office. Spoke with both pt and son as well as [name] with [employer] about this. [name] stated that she was going to be doing spirometry on pt today 3/22 and wanted to know if [name] would be okay using that. Spoke with [name] and he stated that he would just use today's and pt can keep OV as scheduled without having PFT prior. Nothing further needed. ?

## 2021-08-25 NOTE — Research (Signed)
Disclaimer: This blurb is a brief overview of the study this patient is participating in. It is for the use of providers caring for the patient. It is not a regulatory declaration, and may or may not reflect all elements of the protocol. ? ?Title:  A Randomized, double-blind, placebo-controlled dose escalation and verification clinical study, to assess the safety and efficacy of pulsed, inhaled nitric oxide (iNO) in subjects at risk of pulmonary hypertension associated with pulmonary fibrosis on long term oxygen therapy (Part 1 and Part 2). Primary Endpoint: The placebo-corrected change for INOpulse in minutes of moderate to vigorous physical activity (MVPA) measured by actigraphy from baseline to month 4. ? ?[facility] [place] Coordinator note :  ? ?This visit for Subject FLEUR AUDINO with DOB: 1941-05-12 on 08/25/2021 for the above protocol is an End of Study Visit/Encounter and is for purpose of research.  ? ?The consent for this encounter is under Protocol Version: Amendment 7 dated 25Aug 2021 ? IB: version 11.0 approved 20 Jan 2020 ? ICF: cohort 3 approved 02Dec2021 ?Subject expressed that they no longer want to use study drug and discontinue from the study. They complained of feeling sick during the Open Label Extension and believes it is in their best interest to stop today. Subject thanked for participation in research and contribution to science.   ?The Subject was informed that the PI Dr. Chase Caller continues to have oversight of the subject's visits and course  through relevant discussions, reviews and also specifically of this visit by routing of this note to the [place]. ? ?All procedures completed per the above mentioned protocol. Subject stated understanding. Patient has rolled over into Open Label Extension and is knowingly taking study drug. Patient's devices were reprogrammed during this visit. Refer to the subjects paper source binder for further details of the visit.   ? ? ?Signed by ?Leda Gauze Trooper Olander ?Research Assistant ?[facility] ?[place], [place] ?01:52 PM 08/25/2021 ? ?   ?

## 2021-08-26 NOTE — Telephone Encounter (Signed)
[facility] and spoke with Valarie Merino letting him know the addendum that MR wants to have done. Valarie Merino stated that he would send this back to radiologist for review. Routing back to MR. ?

## 2021-09-28 ENCOUNTER — Encounter: Payer: Self-pay | Admitting: Internal Medicine

## 2021-09-28 ENCOUNTER — Ambulatory Visit (INDEPENDENT_AMBULATORY_CARE_PROVIDER_SITE_OTHER): Payer: Medicare Other | Admitting: Internal Medicine

## 2021-09-28 VITALS — BP 130/64 | HR 80 | Temp 98.1°F | Ht 66.0 in | Wt 202.4 lb

## 2021-09-28 DIAGNOSIS — J849 Interstitial pulmonary disease, unspecified: Secondary | ICD-10-CM | POA: Diagnosis not present

## 2021-09-28 DIAGNOSIS — D721 Eosinophilia, unspecified: Secondary | ICD-10-CM | POA: Diagnosis not present

## 2021-09-28 DIAGNOSIS — R0602 Shortness of breath: Secondary | ICD-10-CM

## 2021-09-28 DIAGNOSIS — Z5181 Encounter for therapeutic drug level monitoring: Secondary | ICD-10-CM

## 2021-09-28 LAB — CBC WITH DIFFERENTIAL/PLATELET
Basophils Absolute: 0.1 10*3/uL (ref 0.0–0.1)
Basophils Relative: 0.6 % (ref 0.0–3.0)
Eosinophils Absolute: 0.5 10*3/uL (ref 0.0–0.7)
Eosinophils Relative: 5.4 % — ABNORMAL HIGH (ref 0.0–5.0)
HCT: 40.8 % (ref 36.0–46.0)
Hemoglobin: 13.1 g/dL (ref 12.0–15.0)
Lymphocytes Relative: 36.8 % (ref 12.0–46.0)
Lymphs Abs: 3.3 10*3/uL (ref 0.7–4.0)
MCHC: 32.1 g/dL (ref 30.0–36.0)
MCV: 94 fl (ref 78.0–100.0)
Monocytes Absolute: 0.9 10*3/uL (ref 0.1–1.0)
Monocytes Relative: 10.5 % (ref 3.0–12.0)
Neutro Abs: 4.2 10*3/uL (ref 1.4–7.7)
Neutrophils Relative %: 46.7 % (ref 43.0–77.0)
Platelets: 296 10*3/uL (ref 150.0–400.0)
RBC: 4.34 Mil/uL (ref 3.87–5.11)
RDW: 14.2 % (ref 11.5–15.5)
WBC: 9 10*3/uL (ref 4.0–10.5)

## 2021-09-28 LAB — BASIC METABOLIC PANEL
BUN: 10 mg/dL (ref 6–23)
CO2: 30 mEq/L (ref 19–32)
Calcium: 9.3 mg/dL (ref 8.4–10.5)
Chloride: 101 mEq/L (ref 96–112)
Creatinine, Ser: 0.48 mg/dL (ref 0.40–1.20)
GFR: 88.56 mL/min (ref >60.00)
Glucose, Bld: 86 mg/dL (ref 70–99)
Potassium: 3.8 mEq/L (ref 3.5–5.1)
Sodium: 139 mEq/L (ref 135–145)

## 2021-09-28 LAB — HEPATIC FUNCTION PANEL
ALT: 11 U/L (ref 0–35)
AST: 17 U/L (ref 0–37)
Albumin: 3.8 g/dL (ref 3.5–5.2)
Alkaline Phosphatase: 74 U/L (ref 39–117)
Bilirubin, Direct: 0 mg/dL (ref 0.0–0.3)
Total Bilirubin: 0.3 mg/dL (ref 0.2–1.2)
Total Protein: 7 g/dL (ref 6.0–8.3)

## 2021-09-28 LAB — BRAIN NATRIURETIC PEPTIDE: Pro B Natriuretic peptide (BNP): 38 pg/mL (ref 0.0–100.0)

## 2021-09-28 MED ORDER — AMOXICILLIN 500 MG PO CAPS: 500.0000 mg | ORAL_CAPSULE | Freq: Three times a day (TID) | ORAL | 0 refills | Status: DC

## 2021-09-28 MED ORDER — PREDNISONE 10 MG PO TABS: ORAL_TABLET | ORAL | 0 refills | Status: AC

## 2021-09-28 NOTE — Progress Notes (Signed)
? ? ? ?xxxxxxxxxxxxxxxxxxxxx ? ?OV 07/29/2019 -transfer of care to Dr. Chase Caller interstitial lung disease center. ? ?Subjective:  ?Patient ID: [name], female , DOB: [birth year] , age 81 y.o. , MRN: [id number] , ADDRESS: [place] ?[place] [address] ? ? ?07/29/2019 -   ?Chief Complaint  ?Patient presents with  ? Follow-up  ?  Pt states her breathing has become worse since last visit. States she also has an occ cough.  ? ?#Interstitial lung disease (progressive) with chronic hypoxemic respiratory failure - chronic HP dx ?-Per [facility] September 2016: Disease present at least since 2006/08/06 ? - Surgical lung biopsy  November 20, 2014 at [place]:  ? - local opinion: diagnostic of usual interstitial pneumonia (UIP). Interestingly, there are ?scattered non-necrotizing granulomata in the pleura. ? -Second opinion September 2016 at [facility]: pathology was reviewed at our multidisciplinary ILD conference and noted to have a variegated pattern of fibrosis with some airway centricity, granulomas noted both near the airway and more peripherally. Minimal inflammation was noted. Features not c/w with UIP. Few giant cells with cholesterol clefts. Overall pattern most in keeping with chronic HP ? ?-High-resolution CT chest including April 09, 2018: Lack of craniocaudal gradient and significant air trapping suggestive of chronic hypersensitivity pneumonitis [September 06-Aug-2014 [facility] evaluation thought CT was inconsistent with UIP] ? ?-Clinical diagnosis of chronic hypersensitivity pneumonitis  ? - dx given Sept 2016 at [place] ? -No clear-cut exposure identified at [facility] evaluation September 2016 by Dr. Dorothyann Peng ? -Steroids recommended but patient preferred not to take it September 2016 at [facility] ? ?-Treatment  ? - deferred steroids sept 08-06-14 visit at [facility] with Dr Sherri Sear ? - nintedanib since March 2020 ? ? ?#Small hiatal hernia with acid reflux disease ? ?HPI ?Jouri G Kerkman 81 y.o. -is a  transfer of care from Dr. Lake Bells to Dr. Chase Caller with interstitial lung disease questionnaire.  She was last seen by Dr. Lake Bells in March 2020.  After the onset of the COVID-19 pandemic Dr. Lake Bells is now fully in charge of the Covid hospital [facility].  Therefore he is not in the outpatient medical practice anymore.  Therefore patient has been transferred to the ILD center to Dr. Chase Caller myself.  Is the first time I am meeting her and getting to know her.  She tells me that I took care of her husband for COPD a few years ago.  This was her ex-husband.  He passed away in 08/07/2011 after hospitalization to the ICU under our service.  She is grateful for the care. ? ?She tells me that she has interstitial lung disease and for the last year she has been on nintedanib.  Currently she is having difficulty with nintedanib supply.  She only has 2 weeks left.  She uses 3 L of oxygen at rest.  She says she has been doing that for the last 4 years.  It is stable usage.  I am not really sure what her pulse ox on room air would be.  She says occasionally she would crank it up to 4 L for exertion but she generally uses 3 L both at rest and with exertion.  She says she tolerates nintedanib fine. Her interstitial lung disease history was reviewed and summarized above.  It appears that she was not keen on taking prednisone and her Aug 06, 2014 visit with Dr. Dorothyann Peng at [facility].  Therefore once the INBUILD data was available she was started on nintedanib a  year ago.She wants to have a son involved in her visits.  Because of the COVID-19 pandemic he is waiting outside in the car.  She says at the next visit she wants to bring him in. ? ? ?Her symptom score is listed below.  Her progressive lung function decline is also listed below and the pulmonary function test reviewed.  I personally reviewed and visualized and interpreted those.  Her DLCO is paradoxically higher.  I think this is an error. ? ? ?ROS ?- per  HPI ? ?OV 10/03/2019 ? ?Subjective:  ?Patient ID: [name], female , DOB: 1940/07/30 , age 8 y.o. , MRN: [id number] , ADDRESS: [place] ?[place] [address] ? ? ?#Interstitial lung disease (progressive) with chronic hypoxemic respiratory failure - chronic HP dx ?-Per [facility] September 2016: Disease present at least since 2008 ? - Surgical lung biopsy  November 20, 2014 at [place]:  ? - local opinion: diagnostic of usual interstitial pneumonia (UIP). Interestingly, there are ?scattered non-necrotizing granulomata in the pleura. ? -Second opinion September 2016 at [facility]: pathology was reviewed at our multidisciplinary ILD conference and noted to have a variegated pattern of fibrosis with some airway centricity, granulomas noted both near the airway and more peripherally. Minimal inflammation was noted. Features not c/w with UIP. Few giant cells with cholesterol clefts. Overall pattern most in keeping with chronic HP ? ?-High-resolution CT chest including April 09, 2018: Lack of craniocaudal gradient and significant air trapping suggestive of chronic hypersensitivity pneumonitis [September 2016 [facility] evaluation thought CT was inconsistent with UIP] ? ?-Clinical diagnosis of chronic hypersensitivity pneumonitis  ? - dx given Sept 2016 at [place] ? -No clear-cut exposure identified at [facility] evaluation September 2016 by Dr. Dorothyann Peng ? -Steroids recommended but patient preferred not to take it September 2016 at [facility] ? ?-Treatment  ? - deferred steroids sept 2016 visit at [facility] with Dr Sherri Sear ? - nintedanib since March 2020 ? ? ?#Small hiatal hernia with acid reflux disease ? ?10/03/2019 -   ?Chief Complaint  ?Patient presents with  ? Follow-up  ?  SOB and cough with activity has increasd, Productive cough with yellow sputum   ? ? ?HPI ?Elcie G Mello 81 y.o. -returns for follow-up with her son.  At this point in time the focus is to establish the true nature of interstitial  lung disease.  She has brought her son with her.  However approximately 2 weeks ago she had acute bronchitis symptoms and called in.  Was given a cephalexin 5 days and prednisone 5 days.  These have helped immensely.  However she feels not back to baseline.  She feels she will benefit from another round of antibiotic with or without prednisone.  She finished ILD questionnaire and it is listed below.  We have not been able to discuss in a multidisciplinary case conference of 4. ?[place] Integrated Comprehensive ILD Questionnaire ? ?Symptoms: See below. ? ? ? ?SYMPTOM SCALE - ILD 07/29/2019 ? 10/03/2019 ?  ?O2 use 3L at rest, occ 4L with exertion 3L at rest and 4L with exertion. Acute bronchitis 2 weeks ago  ?Shortness of Breath 0 -> 5 scale with 5 being worst (score 6 If unable to do)   ?At rest 0 1  ?Simple tasks - showers, clothes change, eating, shaving 1 2  ?Household (dishes, doing bed, laundry) 2 3  ?Shopping 2 2  ?Walking level at own pace 1 2  ?Walking up Stairs 6 4  ?Total (  30-36) Dyspnea Score 12 14  ?How bad is your cough? 1 3.5  ?How bad is your fatigue 0 3.5  ?How bad is nausea 0 0  ?How bad is vomiting? ? 0 0  ?How bad is diarrhea? 0 0  ?How bad is anxiety? 1 1.5  ?How bad is depression 1 1  ? ? ? ?Past Medical History :  -Denies any collagen vascular disease or vasculitis.  Denies any knowledge of prior pulmonary hypertension.  Denies diabetes or thyroid disease.  Denies any stroke.  Denies mononucleosis denies tuberculosis denies pneumonia denies blood clots denies heart disease denies pleurisy ? ? ?ROS: Positive for shoulder pain for the last several years but otherwise no dry eyes.  No Raynaud's.  No weight loss.  No nausea no vomiting no rash ? ? ?FAMILY HISTORY of LUNG DISEASE: Denies any pulmonary fibrosis or COPD or cystic fibrosis of hypersensitive pneumonitis or any other lung disease. ? ? ?EXPOSURE HISTORY: Never smoked cigarettes.  No passive smoking.  No marijuana.  No vaping no cocaine no  intravenous drug use. ? ? ?HOME and HOBBY DETAILS : Current home is 25 years she is lived there for 5 years.  [place].  No mold or mildew no feather pillow.  No steam iron use

## 2021-09-28 NOTE — Patient Instructions (Addendum)
ILD (interstitial lung disease) ([place]) due to chronic HP ?CHronic resp failure with hypoxemia ? ? - PFT wise stable from summer 2022 through March 2023 but slowlyprogressive over 2 years ?- walk test very similar/slight worse compared to 1 year ago ? ?Plan ? -  continue ofev ? - continue o2 3-4LNC ?-cbc, bmet  LFT , bnp check 09/28/2021 ? ?Weight loss, abnormal - prob due to ofev  ? ?  - 09/28/2021 - currently stable and still at healthy level ? ?Plan ? -monitor ? ?Acute sinusitis, unspecified organism - recurrent (2019 CT sinus - clear) ?History of recurrent bronchitis ?History of eosinophilia 2018 ? ?Plan (share decision making) ?- check RAST allergy profole, blood IgE and cbc with diff ?- check ANCA antibodies, MPO and PR-3 ?= based on above results consider biologic ? - amoxcillin 558m three times daily  x 8 days ? - Please take prednisone 40 mg x1 day, then 30 mg x1 day, then 20 mg x1 day, then 10 mg x1 day, and then 5 mg x1 day and stop ? ?Followup ? - spiro/dlco in 8-12 weeks ? - Rayhan Groleau visit - 30 min in 8-12 weeks ? ?

## 2021-10-01 ENCOUNTER — Telehealth: Payer: Self-pay | Admitting: Internal Medicine

## 2021-10-01 NOTE — Telephone Encounter (Signed)
She has high eos. Also blood allregy test positive for various grass. 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 ?Plan ? - start fasenra biologic ? ? ? Latest Reference Range & Units 09/28/21 12:19  ?BASIC METABOLIC PANEL  Rpt  ?Sodium 135 - 145 mEq/L 139  ?Potassium 3.5 - 5.1 mEq/L 3.8  ?Chloride 96 - 112 mEq/L 101  ?CO2 19 - 32 mEq/L 30  ?Glucose 70 - 99 mg/dL 86  ?BUN 6 - 23 mg/dL 10  ?Creatinine 0.40 - 1.20 mg/dL 0.48  ?Calcium 8.4 - 10.5 mg/dL 9.3  ?Alkaline Phosphatase 39 - 117 U/L 74  ?Albumin 3.5 - 5.2 g/dL 3.8  ?AST 0 - 37 U/L 17  ?ALT 0 - 35 U/L 11  ?Total Protein 6.0 - 8.3 g/dL 7.0  ?Bilirubin, Direct 0.0 - 0.3 mg/dL 0.0  ?Total Bilirubin 0.2 - 1.2 mg/dL 0.3  ?GFR >60.00 mL/min 88.56  ?Pro B Natriuretic peptide (BNP) 0.0 - 100.0 pg/mL 38.0  ?Interpretation  Pend  ?WBC 4.0 - 10.5 K/uL 9.0  ?RBC 3.87 - 5.11 Mil/uL 4.34  ?Hemoglobin 12.0 - 15.0 g/dL 13.1  ?HCT 36.0 - 46.0 % 40.8  ?MCV 78.0 - 100.0 fl 94.0  ?MCHC 30.0 - 36.0 g/dL 32.1  ?RDW 11.5 - 15.5 % 14.2  ?Platelets 150.0 - 400.0 K/uL 296.0  ?Neutrophils 43.0 - 77.0 % 46.7  ?Lymphocytes 12.0 - 46.0 % 36.8  ?Monocytes Relative 3.0 - 12.0 % 10.5  ?Eosinophil 0.0 - 5.0 % 5.4 (H)  ?Basophil 0.0 - 3.0 % 0.6  ?NEUT# 1.4 - 7.7 K/uL 4.2  ?Lymphocyte # 0.7 - 4.0 K/uL 3.3  ?Monocyte # 0.1 - 1.0 K/uL 0.9  ?Eosinophils Absolute 0.0 - 0.7 K/uL 0.5  ?Basophils Absolute 0.0 - 0.1 K/uL 0.1  ?Sheep Sorrel IgE kU/L <0.10  ?Pecan/Hickory Tree IgE kU/L <0.10  ?IgE (Immunoglobulin E), Serum <OR=114 kU/L 74  ?Allergen, D pternoyssinus,d7 kU/L 0.18 (H)  ?Cat Dander kU/L <0.10  ?Dog Dander kU/L <0.10  ?[place] Grass kU/L 0.20 (H)  ?Johnson Grass kU/L 0.80 (H)  ?Timothy Grass kU/L 0.49 (H)  ?Cockroach kU/L <0.10  ?Aspergillus fumigatus, m3 kU/L <0.10  ?Allergen, Comm Silver Wendee Copp, t9 kU/L <0.10  ?Allergen, Cottonwood, t14 kU/L <0.10  ?Elm IgE kU/L <0.10  ?Allergen, Mulberry, t76 kU/L <0.10  ?Allergen, Oak,t7 kU/L <0.10  ?COMMON RAGWEED (SHORT) (W1) IGE kU/L <0.10   ?Allergen, Mouse Urine Protein, e78 kU/L <0.10  ?D. farinae kU/L 0.33 (H)  ?Allergen, Cedar tree, t12 kU/L <0.10  ?Box Elder IgE kU/L <0.10  ?Rough Pigweed  IgE kU/L <0.10  ?Allergen, A. alternata, m6 kU/L <0.10  ?Allergen, P. notatum, m1 kU/L <0.10  ?CLADOSPORIUM HERBARUM (M2) IGE kU/L <0.10  ?Class  0/1 ?0 ?0 ?0/1 ?0 ?0 ?0 ?0 ?0 ?0 ?0 ?0 ?0 ?0 ?0 ?0 ?0 ?0 ?0 ?0/1 ?1 ?2 ?0 ?0  ?(H): Data is abnormally high ?Rpt: View report in Results Review for more information ?

## 2021-10-04 NOTE — Telephone Encounter (Signed)
Called and spoke with pt letting her know results of bloodwork and recs per MR and she verbalized understanding. Routing to pharmacy team. ?

## 2021-10-05 ENCOUNTER — Telehealth: Payer: Self-pay | Admitting: Pharmacist

## 2021-10-05 LAB — RESPIRATORY ALLERGY PROFILE REGION II ~~LOC~~
Allergen, A. alternata, m6: <0.1 kU/L
Allergen, Cedar tree, t12: <0.1 kU/L
Allergen, Comm Silver Birch, t9: <0.1 kU/L
Allergen, Cottonwood, t14: <0.1 kU/L
Allergen, D pternoyssinus,d7: 0.18 kU/L — ABNORMAL HIGH
Allergen, Mouse Urine Protein, e78: <0.1 kU/L
Allergen, Mulberry, t76: <0.1 kU/L
Allergen, Oak,t7: <0.1 kU/L
Allergen, P. notatum, m1: <0.1 kU/L
Aspergillus fumigatus, m3: <0.1 kU/L
Bermuda Grass: 0.2 kU/L — ABNORMAL HIGH
Box Elder IgE: <0.1 kU/L
CLADOSPORIUM HERBARUM (M2) IGE: <0.1 kU/L
COMMON RAGWEED (SHORT) (W1) IGE: <0.1 kU/L
Cat Dander: <0.1 kU/L
Class: 0
Class: 0
Class: 0
Class: 0
Class: 0
Class: 0
Class: 0
Class: 0
Class: 0
Class: 0
Class: 0
Class: 0
Class: 0
Class: 0
Class: 0
Class: 0
Class: 0
Class: 0
Class: 0
Class: 1
Class: 2
Cockroach: <0.1 kU/L
D. farinae: 0.33 kU/L — ABNORMAL HIGH
Dog Dander: <0.1 kU/L
Elm IgE: <0.1 kU/L
IgE (Immunoglobulin E), Serum: 74 kU/L (ref <=114)
Johnson Grass: 0.8 kU/L — ABNORMAL HIGH
Pecan/Hickory Tree IgE: <0.1 kU/L
Rough Pigweed  IgE: <0.1 kU/L
Sheep Sorrel IgE: <0.1 kU/L
Timothy Grass: 0.49 kU/L — ABNORMAL HIGH

## 2021-10-05 LAB — ANCA SCREEN W REFLEX TITER: ANCA SCREEN: NEGATIVE

## 2021-10-05 LAB — MPO/PR-3 (ANCA) ANTIBODIES
Myeloperoxidase Abs: <1 AI (ref <1.0)
Serine Protease 3: <1 AI (ref <1.0)

## 2021-10-05 LAB — INTERPRETATION:

## 2021-10-05 NOTE — Telephone Encounter (Signed)
Submitted a Prior Authorization request to [facility] for [facility] via CoverMyMeds. Will update once we receive a response. ? ?Key:  [id number] ? ?AZ&Me PAP application does not appear to have been completed ? ?Knox Saliva, PharmD, MPH, BCPS, CPP ?Clinical Pharmacist (Rheumatology and Pulmonology) ?

## 2021-10-05 NOTE — Telephone Encounter (Signed)
F[facility]enra BIV will be started in telephone encounter ? ?Knox Saliva, PharmD, MPH, BCPS, CPP ?Clinical Pharmacist (Rheumatology and Pulmonology) ?

## 2021-10-06 ENCOUNTER — Other Ambulatory Visit (HOSPITAL_COMMUNITY): Payer: Self-pay

## 2021-10-06 DIAGNOSIS — Z20822 Contact with and (suspected) exposure to covid-19: Secondary | ICD-10-CM | POA: Diagnosis not present

## 2021-10-06 NOTE — Telephone Encounter (Signed)
Patient returned call - she spoke with son and states that she would like to move forward with paying for mediation through insurance. She states she is still not sure if her income is above or below threshold. ? ?We reviewed sidde effects including risk for injection site reaction, less commonly headache/back pain. Reviewed that it is an injection with loading dose at Weeks 0, 4, 8 then every 8 weeks Reviewed that she is eligible due to high eosinophil levels. ? ?She states she is going out of town and would prefer to wait until after she is back in town to get started on [place]. She has my direct phone number to reach back out once she is in town again. ? ?Knox Saliva, PharmD, MPH, BCPS, CPP ?Clinical Pharmacist (Rheumatology and Pulmonology) ?

## 2021-10-06 NOTE — Telephone Encounter (Addendum)
Received notification from [facility] regarding a prior authorization for [facility]. Authorization has been APPROVED from 10/06/21 to 10/06/22.  ? ?Per test claim, copay for 28 days supply is $262.89 ? ?Authorization # [id number] ? ?Patient will call her son regarding income (if she is above or below $72900 threshold for pt assistance). She was provided with my phone number to call back with details. She states she has grant for Ofev so likely she will qualify ? ?Provider portion of PAP placed in Dr. Golden Pop folder to prevent delay once I receive call from patient. Patient portion and auth approval letter placed in PAP pending info folder in pharmacy office. ? ?Knox Saliva, PharmD, MPH, BCPS, CPP ?Clinical Pharmacist (Rheumatology and Pulmonology) ?

## 2021-10-07 DIAGNOSIS — Z20822 Contact with and (suspected) exposure to covid-19: Secondary | ICD-10-CM | POA: Diagnosis not present

## 2021-10-08 DIAGNOSIS — M25561 Pain in right knee: Secondary | ICD-10-CM | POA: Diagnosis not present

## 2021-10-08 DIAGNOSIS — M1711 Unilateral primary osteoarthritis, right knee: Secondary | ICD-10-CM | POA: Diagnosis not present

## 2021-10-11 ENCOUNTER — Other Ambulatory Visit: Payer: Self-pay | Admitting: Primary Care

## 2021-10-12 ENCOUNTER — Telehealth: Payer: Self-pay

## 2021-10-12 NOTE — Telephone Encounter (Signed)
Refill has been sent to pharmacy, patient last seen in April 2023 ?

## 2021-10-12 NOTE — Telephone Encounter (Signed)
PA renewal initiated automatically by CoverMyMeds. ? ?Key: [id number] ?

## 2021-10-12 NOTE — Telephone Encounter (Signed)
Patient Advocate Encounter ?  ?Received notification  that prior authorization for Ofev 150MG capsules is required. ?  ?PA submitted on 10/12/2021 ?Key : [id number] ?Status is pending ?   ? ? ? ? ?

## 2021-10-12 NOTE — Telephone Encounter (Signed)
Received notification from [facility]  regarding a prior authorization for  OFEV 150M CAPS . Authorization has been APPROVED from 10/12/2021 to 10/13/2022.   ? ?Authorization # : [id number]  ?

## 2021-11-12 NOTE — Telephone Encounter (Signed)
Radiology report reviewd

## 2021-11-23 NOTE — Telephone Encounter (Signed)
Called patient regarding a new start for [name] now that she is back in town. Patient was uncomfortable with getting started on the medication at this time. Educated her on the method of injection, that it is FDA approved, has been on the market since 2017 and that we have multiple patients on the medication currently who are doing well. Patient has an appointment with Dr. Chase Caller on 6/22 and would like to speak with her son about the medication before her visit and also speak with Dr. Chase Caller about the medication during their visit. Will follow up on 6/23.

## 2021-11-24 ENCOUNTER — Other Ambulatory Visit: Payer: Self-pay | Admitting: Internal Medicine

## 2021-11-24 DIAGNOSIS — J849 Interstitial pulmonary disease, unspecified: Secondary | ICD-10-CM

## 2021-11-25 ENCOUNTER — Ambulatory Visit (INDEPENDENT_AMBULATORY_CARE_PROVIDER_SITE_OTHER): Payer: Medicare Other | Admitting: Internal Medicine

## 2021-11-25 ENCOUNTER — Encounter: Payer: Self-pay | Admitting: Internal Medicine

## 2021-11-25 ENCOUNTER — Telehealth: Payer: Self-pay | Admitting: Pharmacist

## 2021-11-25 VITALS — BP 128/74 | HR 85 | Ht 67.0 in | Wt 193.2 lb

## 2021-11-25 DIAGNOSIS — J849 Interstitial pulmonary disease, unspecified: Secondary | ICD-10-CM

## 2021-11-25 DIAGNOSIS — R0602 Shortness of breath: Secondary | ICD-10-CM

## 2021-11-25 LAB — PULMONARY FUNCTION TEST
DL/VA % pred: 94 %
DL/VA: 3.77 ml/min/mmHg/L
DLCO cor % pred: 58 %
DLCO cor: 11.97 ml/min/mmHg
DLCO unc % pred: 57 %
DLCO unc: 11.86 ml/min/mmHg
FEF 25-75 Pre: 1.22 L/sec
FEF2575-%Pred-Pre: 82 %
FEV1-%Pred-Pre: 57 %
FEV1-Pre: 1.25 L
FEV1FVC-%Pred-Pre: 107 %
FEV6-%Pred-Pre: 55 %
FEV6-Pre: 1.53 L
FEV6FVC-%Pred-Pre: 103 %
FVC-%Pred-Pre: 54 %
FVC-Pre: 1.58 L
Pre FEV1/FVC ratio: 79 %
Pre FEV6/FVC Ratio: 98 %

## 2021-11-25 MED ORDER — BUDESONIDE-FORMOTEROL FUMARATE 80-4.5 MCG/ACT IN AERO: 2.0000 | INHALATION_SPRAY | Freq: Two times a day (BID) | RESPIRATORY_TRACT | 12 refills | Status: DC

## 2021-11-25 MED ORDER — AMOXICILLIN 500 MG PO CAPS: 500.0000 mg | ORAL_CAPSULE | Freq: Three times a day (TID) | ORAL | 0 refills | Status: DC

## 2021-11-25 NOTE — Patient Instructions (Signed)
Spirometry and DLCO Performed Today.

## 2021-11-25 NOTE — Progress Notes (Signed)
xxxxxxxxxxxxxxxxxxxxx  OV 07/29/2019 -transfer of care to Dr. Chase Caller interstitial lung disease center.  Subjective:  Patient ID: [name], female , DOB: 02-24-1941 , age 81 y.o. , MRN: [id number] , ADDRESS: [place]  [id number]   07/29/2019 -   Chief Complaint  Patient presents with   Follow-up    Pt states her breathing has become worse since last visit. States she also has an occ cough.   #Interstitial lung disease (progressive) with chronic hypoxemic respiratory failure - chronic HP dx -Per [facility] September 2016: Disease present at least since 07-25-2006  - Surgical lung biopsy  November 20, 2014 at [place]:   - local opinion: diagnostic of usual interstitial pneumonia (UIP). Interestingly, there are scattered non-necrotizing granulomata in the pleura.  -Second opinion September 2016 at [facility]: pathology was reviewed at our multidisciplinary ILD conference and noted to have a variegated pattern of fibrosis with some airway centricity, granulomas noted both near the airway and more peripherally. Minimal inflammation was noted. Features not c/w with UIP. Few giant cells with cholesterol clefts. Overall pattern most in keeping with chronic HP  -High-resolution CT chest including April 09, 2018: Lack of craniocaudal gradient and significant air trapping suggestive of chronic hypersensitivity pneumonitis [September 07-25-2014 [facility] evaluation thought CT was inconsistent with UIP]  -Clinical diagnosis of chronic hypersensitivity pneumonitis   - dx given Sept 2016 at [place]  -No clear-cut exposure identified at [facility] evaluation September 2016 by Dr. Dorothyann Peng  -Steroids recommended but patient preferred not to take it September 2016 at [place]   - deferred steroids sept July 25, 2014 visit at [facility] with Dr Sherri Sear  - nintedanib since March 2020   #Small hiatal hernia with acid reflux disease  HPI [name] 81 y.o. -is a  transfer of care from Dr. Lake Bells to Dr. Chase Caller with interstitial lung disease questionnaire.  She was last seen by Dr. Lake Bells in March 2020.  After the onset of the COVID-19 pandemic Dr. Lake Bells is now fully in charge of the Covid hospital [facility].  Therefore he is not in the outpatient medical practice anymore.  Therefore patient has been transferred to the ILD center to Dr. Chase Caller myself.  Is the first time I am meeting her and getting to know her.  She tells me that I took care of her husband for COPD a few years ago.  This was her ex-husband.  He passed away in 07-26-2011 after hospitalization to the ICU under our service.  She is grateful for the care.  She tells me that she has interstitial lung disease and for the last year she has been on nintedanib.  Currently she is having difficulty with nintedanib supply.  She only has 2 weeks left.  She uses 3 L of oxygen at rest.  She says she has been doing that for the last 4 years.  It is stable usage.  I am not really sure what her pulse ox on room air would be.  She says occasionally she would crank it up to 4 L for exertion but she generally uses 3 L both at rest and with exertion.  She says she tolerates nintedanib fine. Her interstitial lung disease history was reviewed and summarized above.  It appears that she was not keen on taking prednisone and her 07/25/2014 visit with Dr. Dorothyann Peng at [facility].  Therefore once the [facility] data was available she was started on  nintedanib a year ago.She wants to have a son involved in her visits.  Because of the COVID-19 pandemic he is waiting outside in the car.  She says at the next visit she wants to bring him in.   Her symptom score is listed below.  Her progressive lung function decline is also listed below and the pulmonary function test reviewed.  I personally reviewed and visualized and interpreted those.  Her DLCO is paradoxically higher.  I think this is an error.   ROS - per  HPI  OV 10/03/2019  Subjective:  Patient ID: [name], female , DOB: 1940/09/25 , age 81 y.o. , MRN: [id number] , ADDRESS: [place] [id number]   #Interstitial lung disease (progressive) with chronic hypoxemic respiratory failure - chronic HP dx -Per [facility] September 2016: Disease present at least since 2008  - Surgical lung biopsy  November 20, 2014 at [place]:   - local opinion: diagnostic of usual interstitial pneumonia (UIP). Interestingly, there are scattered non-necrotizing granulomata in the pleura.  -Second opinion September 2016 at [facility]: pathology was reviewed at our multidisciplinary ILD conference and noted to have a variegated pattern of fibrosis with some airway centricity, granulomas noted both near the airway and more peripherally. Minimal inflammation was noted. Features not c/w with UIP. Few giant cells with cholesterol clefts. Overall pattern most in keeping with chronic HP  -High-resolution CT chest including April 09, 2018: Lack of craniocaudal gradient and significant air trapping suggestive of chronic hypersensitivity pneumonitis [September 2016 [facility] evaluation thought CT was inconsistent with UIP]  -Clinical diagnosis of chronic hypersensitivity pneumonitis   - dx given Sept 2016 at [place]  -No clear-cut exposure identified at [facility] evaluation September 2016 by Dr. Dorothyann Peng  -Steroids recommended but patient preferred not to take it September 2016 at [place]   - deferred steroids sept 2016 visit at [facility] with Dr Sherri Sear  - nintedanib since March 2020   #Small hiatal hernia with acid reflux disease  10/03/2019 -   Chief Complaint  Patient presents with   Follow-up    SOB and cough with activity has increasd, Productive cough with yellow sputum     HPI [name] 81 y.o. -returns for follow-up with her son.  At this point in time the focus is to establish the true nature of interstitial  lung disease.  She has brought her son with her.  However approximately 2 weeks ago she had acute bronchitis symptoms and called in.  Was given a cephalexin 5 days and prednisone 5 days.  These have helped immensely.  However she feels not back to baseline.  She feels she will benefit from another round of antibiotic with or without prednisone.  She finished ILD questionnaire and it is listed below.  We have not been able to discuss in a multidisciplinary case conference of 4. [place] Integrated Comprehensive ILD Questionnaire  Symptoms: See below.    SYMPTOM SCALE - ILD 07/29/2019  10/03/2019   O2 use 3L at rest, occ 4L with exertion 3L at rest and 4L with exertion. Acute bronchitis 2 weeks ago  Shortness of Breath 0 -> 5 scale with 5 being worst (score 6 If unable to do)   At rest 0 1  Simple tasks - showers, clothes change, eating, shaving 1 2  Household (dishes, doing bed, laundry) 2 3  Shopping 2 2  Walking level at own pace 1 2  Walking up Stairs 6 4  Total (30-36) Dyspnea Score 12 14  How bad is your cough? 1 3.5  How bad is your fatigue 0 3.5  How bad is nausea 0 0  How bad is vomiting?  0 0  How bad is diarrhea? 0 0  How bad is anxiety? 1 1.5  How bad is depression 1 1     Past Medical History :  -Denies any collagen vascular disease or vasculitis.  Denies any knowledge of prior pulmonary hypertension.  Denies diabetes or thyroid disease.  Denies any stroke.  Denies mononucleosis denies tuberculosis denies pneumonia denies blood clots denies heart disease denies pleurisy   ROS: Positive for shoulder pain for the last several years but otherwise no dry eyes.  No Raynaud's.  No weight loss.  No nausea no vomiting no rash   FAMILY HISTORY of LUNG DISEASE: Denies any pulmonary fibrosis or COPD or cystic fibrosis of hypersensitive pneumonitis or any other lung disease.   EXPOSURE HISTORY: Never smoked cigarettes.  No passive smoking.  No marijuana.  No vaping no cocaine no  intravenous drug use.   HOME and HOBBY DETAILS : Current home is 25 years she is lived there for 5 years.  In [place] there is no dampness.  No mold or mildew no feather pillow.  No steam iron use no Jacuzzi use no nebulizer use no humidifier use.  No pet birds or parakeets.  No pet gerbils no pet hamsters no pet rabbits no rodents.  No mold in the [facility] duct no music habits such as wind instruments no gardening.  No flood of water damage.  No strong mats.  No exposure to animals at work.  History update in April 2023: She reported that between 2010 2014 she lived in the mother's house that was built in the 1950s.  She thought there was mold in this but never confirmed.  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  She also had a long-term tendency to clean her home heavily with Clorox.  She inhaled these fumes according to the son.  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   PULMONARY TOXICITY HISTORY (27 items): She has done prednisone burst.  She does not want to do long-term prednisone.  She has been on long-term nintedanib but tolerating it well.    Testing -2018 echo with elevated pulmonary systolic pressure 55 mmHg  - nov 2019 Last CT - HRCT IMPRESSION: 1. Spectrum of findings compatible with fibrotic interstitial lung disease with extensive air trapping and absence of basilar gradient, strongly favoring chronic hypersensitivity pneumonitis. Findings have slightly progressed since 04/24/2017, with more clear progression since more remote chest CT of 06/24/2014. Findings are suggestive of an alternative diagnosis (not UIP) per consensus guidelines: Diagnosis of Idiopathic Pulmonary Fibrosis: An  Official ATS/ERS/JRS/ALAT Clinical Practice Guideline. [place], Iss 5, [phone], Feb 04 2017. 2. Stable mild mediastinal lymphadenopathy, most compatible with benign reactive adenopathy. 3. One vessel coronary atherosclerosis. 4. Small hiatal hernia.   Aortic Atherosclerosis (ICD10-I70.0).     Results for MIYU, FENDERSON (MRN [id number]) as of 07/29/2019 11:43  Ref. Range 07/04/2014 12:58 09/03/2015 11:04 09/27/2016 13:56 05/01/2017 14:05 07/19/2019  15:54  FVC-Pre Latest Units: L 2.13 1.93 1.86 1.71 1.61  FVC-%Pred-Pre Latest Units: % 66 61 59 55 53   Results for ALFREDO, COLLYMORE (MRN [id number]) as of 07/29/2019 11:43  Ref. Range 07/04/2014 12:58 09/03/2015 11:04 09/27/2016 13:56 05/01/2017 14:05 07/19/2019 15:54  DLCO unc Latest Units: ml/min/mmHg 13.70 14.70 12.69 11.23 17.11  DLCO unc % pred Latest Units: % 48 51 44 39 82     ROS  OV 12/03/2019  Subjective:  Patient ID: [name], female , DOB: [birth year] , age 63 y.o. , MRN: [id number] , ADDRESS: [place] [address]   12/03/2019 -   Chief Complaint  Patient presents with   Follow-up    cough is better but still present with exertion, breathing about the same since last visit    #Interstitial lung disease (progressive) with chronic hypoxemic respiratory failure - chronic HP dx -Per [facility] September 2016: Disease present at least since 2008  - Surgical lung biopsy  November 20, 2014 at [place]:   - local opinion: diagnostic of usual interstitial pneumonia (UIP). Interestingly, there are scattered non-necrotizing granulomata in the pleura.  -Second opinion September 2016 at [facility]: pathology was reviewed at our multidisciplinary ILD conference and noted to have a variegated pattern of fibrosis with some airway centricity, granulomas noted both near the airway and more peripherally. Minimal inflammation was noted. Features not c/w with UIP. Few giant  cells with cholesterol clefts. Overall pattern most in keeping with chronic HP   - 3rd opinon: June 2021 [facility] ILD [place] - HP   -High-resolution CT chest including April 09, 2018: Lack of craniocaudal gradient and significant air trapping suggestive of chronic hypersensitivity pneumonitis [September 2016 [facility] evaluation thought CT was inconsistent with UIP]  -Clinical diagnosis of chronic hypersensitivity pneumonitis   - dx given Sept 2016 at [place]  -No clear-cut exposure identified at [facility] evaluation September 2016 by Dr. Dorothyann Peng  -Steroids recommended but patient preferred not to take it September 2016 at [place]   - deferred steroids sept 2016 visit at [facility] with Dr Sherri Sear  - nintedanib since March 2020   #Small hiatal hernia with acid reflux disease   HPI [name] 81 y.o. -presents with her son for follow-up.  She continues to be on 3 L at rest 4 L with exertion.  Her acute bronchitis from April 2021 is resolved.  Her overall symptoms are stable/slightly better after her acute bronchitis.  There is no interim complaints.  In the interim we did discuss her at her case conference.  The consensus diagnosis is that she has hypersensitive pneumonitis.  The only exposure is when she was working in the [employer] at in [place].  She and her son again denies any ongoing exposures.  Reviewed her pulmonary function test and it is stable compared to the recent ones.  There is progression over the last few years to several years.  In terms of a high-resolution CT chest is also stable since 2019 through 2021 but progressive when compared to earlier time points.  She was started on nintedanib in March 2020.  She is tolerating this quite well.  It appears that she and she went on nintedanib she is stable.  She yet to do pulmonary rehabilitation.  Last done few years ago.  Son feels that she needs to be more mobile She is yet to connect with the support group She did not get  referred to cardiology for  right heart catheterization. She wants a flutter valve for her associated bronchiectasis seen on CT scan    OV 03/09/2020   Subjective:  Patient ID: [name], female , DOB: 1940-10-24, age 44 y.o. years. , MRN: [id number],  ADDRESS: [place] [address] PCP  Lawerance Cruel, MD Providers : Treatment Team:  Attending Provider: Brand Males, MD   Chief Complaint  Patient presents with   Follow-up    Patient wears 4 liters oxygen with exertion, was prescribed celebrex they want to know if it is ok with OFEV.      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 Interestingly, there are scattered non-necrotizing granulomata in the pleura.  -Second opinion September 2016 at [facility]: pathology was reviewed at our multidisciplinary ILD conference and noted to have a variegated pattern of fibrosis with some airway centricity, granulomas noted both near the airway and more peripherally. Minimal inflammation was noted. Features not c/w with UIP. Few giant cells with cholesterol clefts. Overall pattern most in keeping with chronic HP   - 3rd opinon: June 2021 [facility] [place] - Hypersensitivity Pneumonitis   -High-resolution CT chest including April 09, 2018: Lack of craniocaudal gradient and significant air trapping suggestive of chronic hypersensitivity pneumonitis [September 2016 [facility] evaluation thought CT was inconsistent with UIP]  -Clinical diagnosis of chronic hypersensitivity pneumonitis   - dx given Sept 2016 at [place]  -No clear-cut exposure identified at [facility] evaluation September 2016 by Dr. Dorothyann Peng  -Steroids recommended but patient preferred not to  take it September 2016 at [place]   - deferred steroids sept 2016 visit at [facility] with Dr Dorothyann Peng and again at [facility] in June 2021  - nintedanib sole Rx since March 2020     HPI [name] 81 y.o. -returns for follow-up with her son Simona Huh.  Since her last visit in June 2020 when she continues to be stable.  She is enrolled in the pulmonary rehabilitation program which has helped her fatigue and shortness of breath significantly.  She continues on nintedanib without any adverse side effects.  She wanted to know if it is okay to take cephalexin for arthralgia.  She has chronic osteoarthritis.  She wants to avoid NSAIDs.  In terms of oxygen use she is stable at 4 L nasal cannula.  She recently had pulmonary function test I reviewed this and it shows stability and is documented below.  Her weight continues to be stable.  We referred her for right heart catheterization with because of the Covid delta surge she deferred it.  Of note on February 28, 2020 while trying to get into the car out of her primary care physician's office she fell down and sustained abrasions and bruises injuries particularly in her lower extremity.  Since then her right kneecap patella area is red and warm.  There is a small incision injury there but this is healed.  She thinks it slightly better/stable.  She is not having any fever.  This has not been treated.      ROS - per HPI  CT chest high resolution - may 2021  IMPRESSION: 1. Spectrum of findings compatible with fibrotic interstitial lung disease with moderate air trapping and absence of apicobasilar  gradient. Findings are most compatible with chronic hypersensitivity pneumonitis. No interval progression since 2019 chest CT. Evidence of progression since baseline 2016 chest CT. Findings are suggestive of an alternative diagnosis (not UIP) per consensus guidelines: Diagnosis of Idiopathic Pulmonary Fibrosis: An Official ATS/ERS/JRS/ALAT  Clinical Practice Guideline. [place], Iss 5, [phone], Feb 04 2017. 2. Stable mild mediastinal lymphadenopathy, compatible with benign reactive adenopathy. 3. Small hiatal hernia. 4. Aortic Atherosclerosis (ICD10-I70.0).     Electronically Signed   By: Ilona Sorrel M.D.   On: 10/24/2019 15:02    OV 09/24/2020  Subjective:  Patient ID: [name], female , DOB: [birth year] , age 65 y.o. , MRN: [id number] , ADDRESS: [place] [address] PCP Lawerance Cruel, MD Patient Care Team: Lawerance Cruel, MD as PCP - General (Family Medicine) Tamsen Roers, MD as Referring Physician (Family Medicine) Melrose Nakayama, MD as Consulting Physician (Cardiothoracic Surgery)  This Provider for this visit: Treatment Team:  Attending Provider: Brand Males, MD    09/24/2020 -   Chief Complaint  Patient presents with   Follow-up    Review PFT results. Still reports cough, sometimes productive with clear phlegm.      #Interstitial lung disease (progressive) with chronic hypoxemic respiratory failure - chronic HP dx -Per [facility] September 2016: Disease present at least since 2008  - Surgical lung biopsy  November 20, 2014 at [place]:   - local opinion: diagnostic of usual interstitial pneumonia (UIP). Interestingly, there are scattered non-necrotizing granulomata in the pleura.  -Second opinion September 2016 at [facility]: pathology was reviewed at our multidisciplinary ILD conference and noted to have a variegated pattern of fibrosis with some airway centricity, granulomas noted both near the airway and more peripherally. Minimal inflammation was noted. Features not c/w with UIP. Few giant cells with cholesterol clefts. Overall pattern most in keeping with chronic HP   - 3rd opinon: June 2021 [facility] [place] - Hypersensitivity Pneumonitis   -High-resolution CT chest including April 09, 2018: Lack of  craniocaudal gradient and significant air trapping suggestive of chronic hypersensitivity pneumonitis [September 2016 [facility] evaluation thought CT was inconsistent with UIP]  - May 2021: stability since 2019 but progression sice 2016  -Clinical diagnosis of chronic hypersensitivity pneumonitis   - dx given Sept 2016 at [place]  -No clear-cut exposure identified at [facility] evaluation September 2016 by Dr. Dorothyann Peng  -Steroids recommended but patient preferred not to take it September 2016 at [place]   - deferred steroids sept 2016 visit at [facility] with Dr Dorothyann Peng and again at [facility] in June 2021, Apr 2022  - nintedanib sole Rx since March 2020       HPI [name] 81 y.o. -presents for follow-up.  Last seen in the fall 2021.  After that overall she is been stable but last month she picked up a respiratory infection got antibiotic and prednisone and then she is better.  Nevertheless she thinks her symptoms may be a little worse than baseline.  Still she says she gets by with 3-4 L of oxygen which is her baseline.  Subjective symptom score overall has not changed.  She had pulmonary function test today and this shows a decline it is all documented below.  I visualized this.  Her last CT scan of the chest was in May 2021.  1 year anniversary of the CT scan is coming up.  Over the winter  she declined to have right heart catheterization because of COVID-19.  But at this point in time with the improvement in the pandemic prevalence rate in the local community of COVID she is willing to have a right heart catheterization.  She is tolerating nintedanib just fine without any problems.  A walking desaturation test on room air is as below      OV 10/27/2020  Subjective:  Patient ID: [name], female , DOB: 1941/05/16 , age 29 y.o. , MRN: [id number] , ADDRESS: [place] [address] PCP Lawerance Cruel, MD Patient Care Team: Lawerance Cruel,  MD as PCP - General (Family Medicine) Larey Dresser, MD as PCP - Advanced Heart Failure (Cardiology) Tamsen Roers, MD as Referring Physician (Family Medicine) Melrose Nakayama, MD as Consulting Physician (Cardiothoracic Surgery)  This Provider for this visit: Treatment Team:  Attending Provider: Brand Males, MD   #Interstitial lung disease (progressive) with chronic hypoxemic respiratory failure - chronic HP dx -Per [facility] September 2016: Disease present at least since 2008  - Surgical lung biopsy  November 20, 2014 at [place]:   - local opinion: diagnostic of usual interstitial pneumonia (UIP). Interestingly, there are scattered non-necrotizing granulomata in the pleura.  -Second opinion September 2016 at [facility]: pathology was reviewed at our multidisciplinary ILD conference and noted to have a variegated pattern of fibrosis with some airway centricity, granulomas noted both near the airway and more peripherally. Minimal inflammation was noted. Features not c/w with UIP. Few giant cells with cholesterol clefts. Overall pattern most in keeping with chronic HP   - 3rd opinon: June 2021 [facility] [place] - Hypersensitivity Pneumonitis   -High-resolution CT chest including April 09, 2018: Lack of craniocaudal gradient and significant air trapping suggestive of chronic hypersensitivity pneumonitis [September 2016 [facility] evaluation thought CT was inconsistent with UIP]  - May 2021: stability since 2019 but progression sice 2016  -Clinical diagnosis of chronic hypersensitivity pneumonitis   - dx given Sept 2016 at [place]  -No clear-cut exposure identified at [facility] evaluation September 2016 by Dr. Dorothyann Peng  -Steroids recommended but patient preferred not to take it September 2016 at [place]   - deferred steroids sept 2016 visit at [facility] with Dr Dorothyann Peng and again at [facility] in June 2021, Apr 2022  - nintedanib sole Rx since March  2020    10/27/2020 -  TELEPHONE CALL  Type of visit: Telephone/Video Circumstance: COVID-19 national emergency Identification of patient [name] with Jul 06, 1940 and MRN [id number] - 2 person identifier Risks: Risks, benefits, limitations of telephone visit explained. Patient understood and verbalized agreement to proceed Anyone else on call:  Patient location: [id number] This provider location: [address], [facility], [place], [place]   HPI Jaylean KRISSY OREBAUGH 81 y.o. -this telephone visit is for a few reasons  1)  grandson getting married in July in [place] and [facility] is elevated. Wants to go. Son Denies thiniong of taking her to [facility]. It is 4000 feet + /1300 meters. Advised them [place] is safeer from elevation  2) discussed heart catheterization results which are essentially normal.  3) discussed high-resolution CT chest because of concern of progression but in the last 1 year her ILD is stable on nintedanib.  She is happy about this  4) discuss future therapeutic options: At this point in time she is maxed out on therapy.  Not interested in prednisone.  She is on antifibrotic nintedanib.  We discussed the pulse inhaled nitric oxide study phase 3.  She is very interested in this.  We discussed about the device and also convenience and buying her oxygen backpack called [employer].  I believe she has a copy of the consent for the study.  We discussed the principles of this.  She is very interested.  She wants to hear from the research coordinator.  I have sent the research coordinator Lazaro Arms message.  Advised may be a few weeks.   Heart Cath 10/14/20  1. Minimally elevated PA pressure with PVR only 2.1.  2. Normal filling pressures.  3. No significant coronary disease.  RHC Procedural Findings: Hemodynamics (mmHg) RA mean 4 RV 35/3 PA 36/8, mean 18 PCWP mean 6 LV 165/12 AO 161/69  Oxygen saturations: PA 76% AO 100%  Cardiac Output  (Fick) 5.73  Cardiac Index (Fick) 2.69  PVR 2.1    RESEARCH VISI 08/25/2021     Title:  A Randomized, double-blind, placebo-controlled dose escalation and verification clinical study, to assess the safety and efficacy of pulsed, inhaled nitric oxide (iNO) in subjects at risk of pulmonary hypertension associated with pulmonary fibrosis on long term oxygen therapy (Part 1 and Part 2). Primary Endpoint: The placebo-corrected change for INOpulse in minutes of moderate to vigorous physical activity (MVPA) measured by actigraphy from baseline to month 4.  Duration of treatment: Study participants will receive iNO 45 mcg/kg IBW/hr versus placebo for 4 months (16 weeks) during Part 1. Then in Part 2: Open Label Extension (OLE) Study participants will be offered open label therapy at iNO 45 mcg/kg IBW/hr after completing the Part 1.  Protocol #: PULSE-PHPF-001 (REBUILD), ClinicalTrials.gov Identifier: [id number], **Sponsor is [employer], [place], [place] [address])     Xxxx  This visit for Subject [name] with DOB: May 02, 1941 on 08/25/2021 for the above protocol is Visit/Encounter # OLE  and is for purpose of wihtdrawing frfom study . Subject/LAR expressed continued interest and consent in continuing as a study subject. Subject thanked for participation in research and contribution to science.   S: In the study visit.  She presents with her son.  She tells me that she is done with the study.  She does not want to continue with the study anymore.  Recently she has been noncompliant with using the inhaled nitric oxide device.  I spoke to her and she said after that she was completely compliant.  However this was giving an awful taste [dysgeusia].  She did not feel any benefit.  So she stopped it 24 hours ago.  She is only using it at night.  She also wants to withdraw from the study.  Per the protocol open label patients who stop using the study drug do not need to actively  follow-up but will only be followed up for collection of vital status.  I tried to encourage her to continue to participate in the study.  Did indicate to her that her pulmonary function test had stabilized but she is not interested at all.  Other issues - Interstitial lung disease: She feels it is overall stable.  3 L at rest 4 L with exertion.  Pulmonary function test most recently the [facility] is down but it seemed to improve while she was inhaled nitric oxide.  The DLCO is also stable.  She is tolerating nintedanib well without any problems.  She continues to be able to do her groceries with walking with a walker.  She did have a high-resolution CT chest in January 2023.  Radiologist has not commented on any progression.  I written to the radiologist  #Weight loss: She believes she is lost a lot of weight.  The weight loss profile as below.  She says this is intentional.  I did indicate to her that nintedanib can make people lose weight but she believes it is all intentional.  She feels she is in a healthy position right now.  Therefore she wants to still continue nintedanib  #History of fall.:  She fell on 1 month ago.  She just had a mild bruise to her right knee but did not need any medication.  No injury.  It was a trip and fall when oxygen was delivered.  #Recurrent bronchitis/cough: Saw nurse practitioner acutely 05/26/2021 with productive cough increased mucus buildup that was green and yellow.  She was given prednisone taper and Augmentin.  Chest x-ray at that time showed stable ILD.  She followed up with nurse practitioner 06/14/2021 reporting while on antibiotic and prednisone she was better but then she started deteriorating again but this time did not have any fever but did have yellow and white secretions.  Patient was given a prednisone taper and a CT chest was ordered.  The high-resolution CT chest shows ILD but radiologist has not commented on progression.  She then followed up with nurse  practitioner 06/28/2021.  She was using 3% saline.  After that she is following with me at this visit.  She is saying she continues to have cough.  She feels she needs another course of 8-day amoxicillin.  She is very specific about the amount of oxacillin she needs.  She does not like prednisone but is open to taking a short course of prednisone.  We agreed on a 5-day taper.    OV 09/28/2021  Subjective:  Patient ID: [name], female , DOB: [birth year] , age 43 y.o. , MRN: [id number] , ADDRESS: [place] [address] PCP Lawerance Cruel, MD Patient Care Team: Lawerance Cruel, MD as PCP - General (Family Medicine) Larey Dresser, MD as PCP - Advanced Heart Failure (Cardiology) Tamsen Roers, MD as Referring Physician (Family Medicine) Melrose Nakayama, MD as Consulting Physician (Cardiothoracic Surgery)  This Provider for this visit: Treatment Team:  Attending Provider: Brand Males, MD    09/28/2021 -   Chief Complaint  Patient presents with   Follow-up    PFT performed with research 08/25/21. Pt states that she has had a cough as well as some congestion. Also has some wheezing.     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 Interestingly, there are scattered non-necrotizing granulomata in the pleura.  -Second opinion September 2016 at [facility]: pathology was reviewed at our multidisciplinary ILD conference and noted to have a variegated pattern of fibrosis with some airway centricity, granulomas noted both near the airway and more peripherally. Minimal inflammation was noted. Features not c/w with UIP. Few giant cells with cholesterol clefts. Overall pattern most in keeping with  chronic HP   - 3rd opinon: June 2021 COne ILD [place] - Hypersensitivity Pneumonitis   -High-resolution CT chest including April 09, 2018: Lack of craniocaudal gradient and significant air trapping suggestive of  chronic hypersensitivity pneumonitis [September 2016 [facility] evaluation thought CT was inconsistent with UIP]  - May 2021: stability since 2019 but progression sice 2016  -Clinical diagnosis of chronic hypersensitivity pneumonitis   - dx given Sept 2016 at [place]  -No clear-cut exposure identified at [facility] evaluation September 2016 by Dr. Dorothyann Peng  -Steroids recommended but patient preferred not to take it September 2016 at [facility]   -Normal right heart catheterization May 2022  -Treatment   - deferred steroids sept 2016 visit at [facility] with Dr Dorothyann Peng and again at [facility] in June 2021, Apr 2022  - nintedanib sole Rx since March 2020  -Inhaled nitric oxide versus placebo research protocol: Withdrawal consent and follow-up March 2023  HPI [name] 81 y.o. -here for follow-up.  Presents with son Simona Huh she is now off the inhaled nitric oxide protocol.  She continues to take nintedanib.  Overall she feels stable.  She continues on 3-4 L of oxygen.  Nevertheless she continues to complain about recurrent sinus or bronchitis issues.  Review of the labs indicate that she has had eosinophilia in 2018.  No blood allergy work-up although mold hypersensitive pneumonitis panel has been negative.  No prior IgE evaluation.  She says that she got an infection in December 2023 and since then she is having recurrent sinusitis or bronchitis.  Currently with the pollen levels she feels she is having a sinusitis.  In March 2023 I gave her amoxicillin and prednisone.  She wants the same again.  In mid May 2023 she is going to visit her uncle and also go for a wedding in [place] in the [place] area and she will be back in early June 2023.  Her son Simona Huh is going to take  her.  She wants to make sure she gets an antibiotic and prednisone before the trip as well because she wants to feel good for the trip.  Her most recent pulmonary function test was in March 2023.  Her symptom score is detailed below recently stable but over time it is worse.        HRCT Jan 2023  Narrative & Impression  CLINICAL DATA:  Interstitial lung disease.   EXAM: CT CHEST WITHOUT CONTRAST   TECHNIQUE: Multidetector CT imaging of the chest was performed following the standard protocol without intravenous contrast. High resolution imaging of the lungs, as well as inspiratory and expiratory imaging, was performed.   RADIATION DOSE REDUCTION: This exam was performed according to the departmental dose-optimization program which includes automated exposure control, adjustment of the mA and/or kV according to patient size and/or use of iterative reconstruction technique.   COMPARISON:  09/29/2020, 10/24/2019 and 04/09/2018.   FINDINGS: Cardiovascular: Atherosclerotic calcification of the aorta, aortic valve and coronary arteries. Pulmonic trunk and heart are enlarged. No pericardial effusion.   Mediastinum/Nodes: Low-attenuation right thyroid nodule measures approximately 2.0 cm. Streak artifact from a right shoulder arthroplasty degrades image quality in this area. No pathologically enlarged mediastinal or axillary lymph nodes. Hilar regions are difficult to evaluate without IV contrast. Esophagus is grossly unremarkable.   Lungs/Pleura: Traction bronchiectasis/bronchiolectasis, architectural distortion, subpleural reticular densities and ground-glass, similar to prior exams. No definite zonal predominance. Biopsy suture line in the inferior aspect of the anterior segment right upper lobe. No pleural fluid. Airway is unremarkable. There is air trapping.   Upper Abdomen: Visualized portion of the liver is unremarkable. Stones in the gallbladder. Visualized portions of  the adrenal glands, kidneys, spleen, pancreas, stomach  and bowel are unremarkable with the exception of a small hiatal hernia. No upper abdominal adenopathy.   Musculoskeletal: Degenerative changes in the spine. Right shoulder arthroplasty. No worrisome lytic or sclerotic lesions.   IMPRESSION: 1. 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 Findings are suggestive of an alternative diagnosis (not UIP) per consensus guidelines: Diagnosis of Idiopathic Pulmonary Fibrosis: An Official ATS/ERS/JRS/ALAT Clinical Practice Guideline. [place], Iss 5, [phone], Feb 04 2017. 2. Cholelithiasis. 3. 2.0 cm low-attenuation right thyroid nodule. Recommend thyroid ultrasound. (Ref: J Am Coll Radiol. 2015 Feb;12(2): 143-50). 4. Aortic atherosclerosis (ICD10-I70.0). Coronary artery calcification. 5. Enlarged pulmonic trunk, indicative of pulmonary arterial hypertension.     Electronically Signed   By: Lorin Picket M.D.   On: 06/23/2021 10:44         OV 11/25/2021  Subjective:  Patient ID: [name], female , DOB: 1941/01/29 , age 22 y.o. 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 Interestingly, there are scattered non-necrotizing granulomata in the pleura.  -Second opinion September 2016 at [facility]: pathology was reviewed at our multidisciplinary ILD conference and noted to have a variegated pattern of fibrosis with some airway centricity, granulomas noted both near the airway and more peripherally. Minimal inflammation was noted. Features not c/w with UIP. Few giant cells with cholesterol clefts. Overall pattern most in keeping with chronic HP   - 3rd opinon: June 2021 [facility] ILD [place] - Hypersensitivity Pneumonitis   -High-resolution CT chest including April 09, 2018: Lack of craniocaudal gradient and significant air trapping suggestive of chronic hypersensitivity pneumonitis [September 2016 [facility] evaluation thought CT was inconsistent with UIP]  - May 2021: stability since 2019 but progression sice 2016  -Clinical diagnosis of chronic hypersensitivity pneumonitis   - dx given Sept 2016 at [place]  -No clear-cut exposure identified at [facility] evaluation September 2016 by Dr. Dorothyann Peng  -Steroids recommended but patient preferred not to take it September 2016 at [facility]   -Positive RAST allergy panel with blood eosinophilia in April 2023   - Started Symbicort June 2023  -Normal right heart catheterization May 2022  -Unintentional weight loss noted in 2023  = Presumed secondary to nintedanib  -Treatment   - deferred steroids sept 2016 visit at [facility] with Dr Dorothyann Peng and again at [facility] in June 2021, Apr 2022  - nintedanib sole Rx since March 2020  -Inhaled nitric oxide versus placebo research protocol: Withdrawal consent and follow-up March 2023  (study results launched in late spring 2023 and negative]  11/25/2021 -   Chief Complaint  Patient presents with   Follow-up    PFT performed today.  Pt states she has been doing okay since last visit. States she has had congestion and has not felt that  great since last visit.     HPI [name] 81 y.o. -presents for follow-up with her son Simona Huh.  Overall she is stable.  She continues to use 3.5 L at rest and 5-6 L with exertion.  She states this is stable.  She continues to lose weight.  It is unintentional.  She is on nintedanib.  She says her primary care has ruled out other causes.  At this point in time she is still technically overweight so she is content losing weight.  Overall there is no appetite.  She still frustrated by the recurrent acute sinusitis.  She gets exposed to sick grandkids she feels that she is having sinus congestion right now with blockage in the last few weeks and intermittent white-yellow drainage.  She does not Nettie pot she has occasional wheezing.  Her blood eosinophils are quite elevated at 5 8 cells per cubic millimeter.  RAST allergy panel is positive.  She wants to do another course of antibiotic but she does not want to do prednisone because of intolerance.  After her blood results came back I advised Fas[place].  But at that time I did not realize she was not on inhaled corticosteroids.  Anyway she prefers to do inhaler first approach.  From a dyspnea standpoint she is stable.  She had pulmonary function test that is now stable for 1 year but is progressive over many years.  She prefers to continue her nintedanib at this point.  She does not want to do oral prednisone     SYMPTOM SCALE - ILD 07/29/2019  10/03/2019  12/03/2019 217# 03/09/2020  09/24/2020 207# 08/25/2021 - withdrew from iNP sutdy Weight 201# 09/28/2021 - 202#  11/25/2021 - 193#   O2 use 3L at rest, occ 4L with exertion 3L at rest and 4L with exertion. Acute bronchitis 2 weeks ago 3L rest, 4 L exertion 3-4L [place] On ofev. Doing rehab 3/4L [place] pulse . On ofev 3L rest, 4L with eretiion. Weight 201# Treated oxygen with rest and 4 L exertion and pulse 3.5L rest, 4-5L exertion  Shortness of Breath 0 -> 5 scale with 5 being worst (score 6 If unable to  do)         At rest 0 1 0 0 0 1 0 1  Simple tasks - showers, clothes change, eating, shaving _0 Household (dishes, doing bed, laundry) _1 2._2 Shopping _3 2._4 Walking level at own pace _5 Walking up Stairs _6 Total (30-36) Dyspnea Score _7 How bad is your cough? 1 3.5  More often lateley _8 How bad is your fatigue 0 3.5 2 Better with exercise 2._9 How bad is nausea 0 0 0 0 0 0 0 0  How bad is vomiting?  0 0 0 0 0 00 0 0  How bad is diarrhea? 0 0 0 0 0 0 0 0  How bad is anxiety? 1 1._10 00 1 1  How bad is depression _11 0 0 1 1    Simple office walk 185 feet x  3 laps goal with forehead probe 09/24/2020  09/28/2021   O2 used ra (baseline uses 3-4L at home)  ra  Number laps completed End of 1 lap out of intented 3 Stoppsed at 3/4 of 1st lap  Comments about pace slow avg with walker  Resting Pulse Ox/HR 94% and 87/min 95% and HR 83  Final Pulse Ox/HR 85% and 113/min 82% and HR 89  Desaturated </= 88% yes   Desaturated <= 3% points yes   Got Tachycardic >/= 90/min yes   Symptoms at end of test Mod dyspnea   Miscellaneous comments desat at end iof 1 lap. STable on room air      CT Chest data  No results found.    PFT     Latest Ref Rng & Units 11/25/2021    3:10 PM 09/24/2020    2:56 PM 12/03/2019   11:39 AM 12/03/2019    9:57 AM 07/19/2019    3:54 PM 05/01/2017    2:05 PM 09/27/2016    1:56 PM  PFT Results  FVC-Pre L 1.58  P 1.52  1.70  1.66  1.61  1.71  1.86   FVC-Predicted Pre % 54  P 50  57  55  53  55  59   FVC-Post L     1.87  1.72  1.91   FVC-Predicted Post %     61  55  61   Pre FEV1/FVC % % 79  P 80  81  82  81  87  86   Post FEV1/FCV % %     78  90  89   FEV1-Pre L 1.25  P 1.21  1.39  1.36  1.31  1.50  1.60   FEV1-Predicted Pre % 57  P 54  62  60  57  64  68   FEV1-Post L     1.46  1.56  1.70   DLCO uncorrected ml/min/mmHg 11.86  P 5.95  15.25  13.20   17.11  11.23  12.69   DLCO UNC% % 57  P 28  73  63  82  39  44   DLCO corrected ml/min/mmHg 11.97  P 5.95  15.25  13.20   10.63  12.07   DLCO COR %Predicted % 58  P 28  73  63   37  42   DLVA Predicted % 94  P 67  114  103  120  70  80   TLC L     3.63  3.37  3.49   TLC % Predicted %     65  61  63   RV % Predicted %     73  63  64     P Preliminary result    No results found for: "NITRICOXIDE"     Latest Reference Range & Units 09/28/21 40:35  BASIC METABOLIC PANEL  Rpt  Sodium 135 - 145 mEq/L 139  Potassium 3.5 - 5.1 mEq/L 3.8  Chloride 96 - 112 mEq/L 101  CO2 19 - 32 mEq/L 30  Glucose 70 - 99 mg/dL 86  BUN 6 - 23 mg/dL 10  Creatinine 0.40 - 1.20 mg/dL 0.48  Calcium 8.4 - 10.5 mg/dL 9.3  Alkaline Phosphatase 39 - 117 U/L 74  Albumin 3.5 - 5.2 g/dL 3.8  AST 0 - 37 U/L 17  ALT 0 - 35 U/L 11  Total Protein 6.0 - 8.3 g/dL 7.0  Bilirubin, Direct 0.0 - 0.3 mg/dL 0.0  Total Bilirubin 0.2 - 1.2 mg/dL 0.3  GFR >60.00 mL/min 88.56  Pro B Natriuretic peptide (BNP) 0.0 -  100.0 pg/mL 38.0  Interpretation  Pend  WBC 4.0 - 10.5 K/uL 9.0  RBC 3.87 - 5.11 Mil/uL 4.34  Hemoglobin 12.0 - 15.0 g/dL 13.1  HCT 36.0 - 46.0 % 40.8  MCV 78.0 - 100.0 fl 94.0  MCHC 30.0 - 36.0 g/dL 32.1  RDW 11.5 - 15.5 % 14.2  Platelets 150.0 - 400.0 K/uL 296.0  Neutrophils 43.0 - 77.0 % 46.7  Lymphocytes 12.0 - 46.0 % 36.8  Monocytes Relative 3.0 - 12.0 % 10.5  Eosinophil 0.0 - 5.0 % 5.4 (H)  Basophil 0.0 - 3.0 % 0.6  NEUT# 1.4 - 7.7 K/uL 4.2  Lymphocyte # 0.7 - 4.0 K/uL 3.3  Monocyte # 0.1 - 1.0 K/uL 0.9  Eosinophils Absolute 0.0 - 0.7 K/uL 0.5  Basophils Absolute 0.0 - 0.1 K/uL 0.1  Sheep Sorrel IgE kU/L <0.10  Pecan/Hickory Tree IgE kU/L <0.10  IgE (Immunoglobulin E), Serum <OR=114 kU/L 74  Allergen, D pternoyssinus,d7 kU/L 0.18 (H)  Cat Dander kU/L <0.10  Dog Dander kU/L <0.10  [place] Grass kU/L 0.20 (H)  [place] Grass kU/L 0.80 (H)  Timothy Grass kU/L 0.49 (H)  Cockroach kU/L <0.10   Aspergillus fumigatus, m3 kU/L <0.10  Allergen, Comm Silver Wendee Copp, t9 kU/L <0.10  Allergen, Cottonwood, t14 kU/L <0.10  Elm IgE kU/L <0.10  Allergen, Mulberry, t76 kU/L <0.10  Allergen, Oak,t7 kU/L <0.10  COMMON RAGWEED (SHORT) (W1) IGE kU/L <0.10  Allergen, Mouse Urine Protein, e78 kU/L <0.10  D. farinae kU/L 0.33 (H)  Allergen, Cedar tree, t12 kU/L <0.10  Box Elder IgE kU/L <0.10  Rough Pigweed  IgE kU/L <0.10  ANCA SCREEN Negative  Negative  Myeloperoxidase Abs <1.0 AI <1.0  Serine Protease 3 <1.0 AI <1.0  Allergen, A. alternata, m6 kU/L <0.10  Allergen, P. notatum, m1 kU/L <0.10  CLADOSPORIUM HERBARUM (M2) IGE kU/L <0.10  Class  0/1 0 0 0/1 0 0 0 0 0 0 0 0 0 0 0 0 0 0 0 0/_0 0 0  (H): Data is abnormally high Rpt: View report in Results Review for more information   reports that she has never smoked. She has never used smokeless tobacco.  Past Surgical History:  Procedure Laterality Date   BREAST CYST EXCISION Left 50 yrs ago   CARPELL TUNNELL SURG Right    COLONOSCOPY     ESOPHAGOGASTRODUODENOSCOPY     EYE SURGERY     bilateral cataract surgery with lens implants   KNEE ARTHROSCOPY W/ MENISCAL REPAIR     left   LUNG BIOPSY Right 11/20/2014   Procedure: LUNG BIOPSY;  Surgeon: Melrose Nakayama, MD;  Location: [place];  Service: Thoracic;  Laterality: Right;   REVERSE SHOULDER ARTHROPLASTY Right 06/07/2018   Procedure: REVERSE SHOULDER ARTHROPLASTY;  Surgeon: Justice Britain, MD;  Location: [facility];  Service: Orthopedics;  Laterality: Right;  138mn   RIGHT/LEFT HEART CATH AND CORONARY ANGIOGRAPHY N/A 10/14/2020   Procedure: RIGHT/LEFT HEART CATH AND CORONARY ANGIOGRAPHY;  Surgeon: MLarey Dresser MD;  [facility];  Service: Cardiovascular;  Laterality: N/A;   TOTAL KNEE ARTHROPLASTY Left 08/13/2013   Procedure: LEFT TOTAL KNEE ARTHROPLASTY;  Surgeon: MMauri Pole MD;  Location: [facility];  Service: Orthopedics;  Laterality: Left;    UMBILICAL HERNIA REPAIR     VIDEO ASSISTED THORACOSCOPY Right 11/20/2014   Procedure: RIGHT  VIDEO ASSISTED THORACOSCOPY WITH WEDGE LUNG BIOPSIES RIGHT UPPER,MIDDLE AND LOWER LOBES ,PLACEMENT OF ON Q PAIN PUMP;  Surgeon: SMelrose Nakayama  MD;  Location: [place];  Service: Thoracic;  Laterality: Right;    Allergies  Allergen Reactions   Adhesive [Tape] Itching and Rash   Latex Itching and Swelling   Doxycycline Rash   Sulfonamide Derivatives Other (See Comments)    Unknown    Immunization History  Administered Date(s) Administered   Fluad Quad(high Dose 65+) 02/07/2019, 05/06/2021   Influenza, High Dose Seasonal PF 04/20/2016, 02/23/2017, 02/12/2018   Influenza-Unspecified 03/06/2014, 02/18/2015   PFIZER(Purple Top)SARS-COV-2 Vaccination 07/12/2019, 08/06/2019, 04/12/2020   Pneumococcal Conjugate-13 04/14/2014   Pneumococcal Polysaccharide-23 06/14/2017   Tdap 06/14/2017   Zoster, Live 04/24/2013    Family History  Problem Relation Age of Onset   Tuberculosis Father    Tuberculosis Paternal Uncle      Current Outpatient Medications:    amoxicillin (AMOXIL) 500 MG capsule, Take 1 capsule (500 mg total) by mouth 3 (three) times daily., Disp: 15 capsule, Rfl: 0   aspirin 325 MG tablet, Take 650 mg by mouth daily as needed for moderate pain or headache., Disp: , Rfl:    budesonide-formoterol (SYMBICORT) 80-4.5 MCG/ACT inhaler, Inhale 2 puffs into the lungs in the morning and at bedtime., Disp: 10.2 g, Rfl: 12   Cholecalciferol (DIALYVITE VITAMIN D 5000) 125 MCG (5000 UT) capsule, Take 5,000 Units by mouth daily., Disp: , Rfl:    fluticasone (FLONASE) 50 MCG/ACT nasal spray, Place 1 spray into both nostrils daily as needed for allergies. , Disp: , Rfl:    furosemide (LASIX) 20 MG tablet, as needed., Disp: , Rfl:    ibuprofen (ADVIL) 200 MG tablet, Take 200-400 mg by mouth every 6 (six) hours as needed for headache or moderate pain., Disp: , Rfl:    loratadine (CLARITIN) 5 MG  chewable tablet, Chew 5-10 mg by mouth daily as needed for allergies. , Disp: , Rfl:    Multiple Vitamin (MULTIVITAMIN WITH MINERALS) TABS tablet, Take 1 tablet by mouth daily., Disp: , Rfl:    Nintedanib (OFEV) 150 MG CAPS, TAKE 1 CAPSULE BY MOUTH TWICE DAILY, Disp: 60 capsule, Rfl: 11   Respiratory Therapy Supplies (FLUTTER) DEVI, Use as directed., Disp: 1 each, Rfl: 0   sodium chloride HYPERTONIC 3 % nebulizer solution, Take by nebulization in the morning and at bedtime., Disp: 750 mL, Rfl: 1   VENTOLIN HFA 108 (90 Base) MCG/ACT inhaler, INHALE 2 PUFFS INTO THE LUNGS EVERY 6 HOURS AS NEEDED FOR WHEEZING OR SHORTNESS OF BREATH, Disp: 18 g, Rfl: 6   vitamin B-12 (CYANOCOBALAMIN) 1000 MCG tablet, Take 1,000 mcg by mouth every other day., Disp: , Rfl:       Objective:   Vitals:   11/25/21 1536  BP: 128/74  Pulse: 85  SpO2: 93%  Weight: 193 lb 3.2 oz (87.6 kg)  Height: 5' 7" (1.702 m)    Estimated body mass index is 30.26 kg/m as calculated from the following:   Height as of this encounter: 5' 7" (1.702 m).   Weight as of this encounter: 193 lb 3.2 oz (87.6 kg).  _0 @  Filed Weights   11/25/21 1536  Weight: 193 lb 3.2 oz (87.6 kg)     Physical Exam  General: No distress. obese Neuro: Alert and Oriented x 3. GCS 15. Speech normal Psych: Pleasant Resp:  Barrel Chest - no.  Wheeze - no, Crackles - YES at base, No overt respiratory distress CVS: Normal heart sounds. Murmurs - no Ext: Stigmata of Connective Tissue Disease - no HEENT: Normal upper airway. PEERL +. No post  nasal drip        Assessment:       ICD-10-CM   1. ILD (interstitial lung disease) ([place])  J84.9 ECHOCARDIOGRAM COMPLETE    2. 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 CHronic resp failure with hypoxemia   - PFT wise stable from summer 2022 through June 2023 (though progressive  since 2018 and 2021)  Plan  -  continue ofev  - continue o2 3-4LNC at rest and more with exertion - goal pulse ox > 88% - check ECHO at time of next visit - do simple walk test at time of next visit - check bnp, cbc, bmet , lft next visit   Weight loss, abnormal - prob due to ofev     - 11/25/2021  - currently stable and still at healthy level  Plan  -monitor - make sure PCP Lawerance Cruel, MD has ruled out other causes  Acute sinusitis, unspecified organism - recurrent (2019 CT sinus - clear) History of recurrent bronchitis 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 FEatures are c/w allergic asthma  Plan (share decision making) - -repeat amoxcillin 565m three times daily  x 5 days  - HOld off prednisone at your request - start symbicort 80/4.5 at 2 puff twice daily with albuterol as needed - hold off fasenra application at this moment - can cosider if declining or having recurrent flare ups  Followup  - Stran Raper visit - 30 min in 12 weeks but after echo  - symptoms score and walk test  ( Level 05 visit: Estb 40-54 min in  visit type: on-site physical face to visit  in total care time and counseling or/and coordination of care by this undersigned MD - Dr MBrand Males This includes one or more of the following on this same day 11/25/2021: pre-charting, chart review, note writing, documentation discussion of test results, diagnostic or treatment recommendations, prognosis, risks and benefits of management options, instructions, education, compliance or risk-factor reduction. It excludes time spent by the [place] office staff in the care of the patient. Actual time 482min)   SIGNATURE    Dr. MBrand Males M.D., F.C.C.P,  Pulmonary and Critical Care Medicine Staff Physician, CTustinDirector - Interstitial Lung Disease  Program  [address] [place] [address] Pager: [phone] If no answer or between  15:00h - 7:00h: call [phone] Telephone: [phone]  6:14 PM 11/25/2021

## 2021-11-25 NOTE — Patient Instructions (Addendum)
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 CHronic resp failure with hypoxemia   - PFT wise stable from summer 2022 through June 2023 (though progressive since 2018 and 2021)  Plan  -  continue ofev  - continue o2 3-4LNC at rest and more with exertion - goal pulse ox > 88% - check ECHO at time of next visit - do simple walk test at time of next visit - check bnp, cbc, bmet , lft next visit   Weight loss, abnormal - prob due to ofev     - 11/25/2021  - currently stable and still at healthy level  Plan  -monitor - make sure PCP Lawerance Cruel, MD has ruled out other causes  Acute sinusitis, unspecified organism - recurrent (2019 CT sinus - clear) History of recurrent bronchitis 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 FEatures are c/w allergic asthma  Plan (share decision making) - -repeat amoxcillin 566m three times daily  x 5 days  - HOld off prednisone at your request - start symbicort 80/4.5 at 2 puff twice daily with albuterol as needed - hold off fasenra application at this moment - can cosider if declining or having recurrent flare ups  Followup  - Kyrell Ruacho visit - 30 min in 12 weeks but after echo  - symptoms score and walk test

## 2021-12-08 ENCOUNTER — Telehealth: Payer: Self-pay | Admitting: Internal Medicine

## 2021-12-08 NOTE — Telephone Encounter (Signed)
Called and spoke with patient. She stated that she started the Symbicort 54mg a few days. She has noticed that since starting the inhaler, she has felt more weak and SOB. She misplaced the box with the instructions so she has only been taking 1 puff twice daily. She also has a productive cough with yellowish phlegm. Denied any fevers or body aches.   She also stated that she was curious about the ingredients in Symbicort and found that it contains sulfur. 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 Denied any anaphylaxis reactions so far.   She wanted to know if MR would remind another inhaler for her.   MR, can you please advise? Thanks.

## 2021-12-08 NOTE — Telephone Encounter (Signed)
Called and spoke with patient. She verbalized understanding of recommendations.   Devki, can you please advise about inhalers that do not contain sulfur? Thanks!

## 2021-12-08 NOTE — Telephone Encounter (Signed)
Stop symbicort  Plan  - she does not like steroids - so will hold off - can do z pak if she likes  - for alternative - copying Devki to see what she will recommend that does not have sulfur I int   Allergies  Allergen Reactions   Adhesive [Tape] Itching and Rash   Latex Itching and Swelling   Doxycycline Rash   Sulfonamide Derivatives Other (See Comments)    Unknown

## 2021-12-15 NOTE — Telephone Encounter (Signed)
ATC patient to advise that I am still investigating non-sulfur containing inhalers and will let her know once I have a definite answer . I am still unsure where she read that Symbicort contains sulfur as I have been unable to find this excipient myself  Phone kept ringing and eventually led to a beep. Unsure if I left a VM or not because phone stopped ringing  Knox Saliva, PharmD, MPH, BCPS, CPP Clinical Pharmacist (Rheumatology and Pulmonology)

## 2022-02-22 ENCOUNTER — Encounter: Payer: Self-pay | Admitting: Internal Medicine

## 2022-02-22 ENCOUNTER — Ambulatory Visit (INDEPENDENT_AMBULATORY_CARE_PROVIDER_SITE_OTHER): Payer: Medicare Other | Admitting: Internal Medicine

## 2022-02-22 VITALS — BP 138/76 | HR 84 | Temp 98.6°F | Ht 67.0 in | Wt 194.0 lb

## 2022-02-22 DIAGNOSIS — D721 Eosinophilia, unspecified: Secondary | ICD-10-CM | POA: Diagnosis not present

## 2022-02-22 DIAGNOSIS — J679 Hypersensitivity pneumonitis due to unspecified organic dust: Secondary | ICD-10-CM

## 2022-02-22 DIAGNOSIS — J8283 Eosinophilic asthma: Secondary | ICD-10-CM | POA: Diagnosis not present

## 2022-02-22 DIAGNOSIS — J849 Interstitial pulmonary disease, unspecified: Secondary | ICD-10-CM | POA: Diagnosis not present

## 2022-02-22 DIAGNOSIS — J4541 Moderate persistent asthma with (acute) exacerbation: Secondary | ICD-10-CM

## 2022-02-22 DIAGNOSIS — R0609 Other forms of dyspnea: Secondary | ICD-10-CM

## 2022-02-22 DIAGNOSIS — J209 Acute bronchitis, unspecified: Secondary | ICD-10-CM

## 2022-02-22 DIAGNOSIS — R0600 Dyspnea, unspecified: Secondary | ICD-10-CM

## 2022-02-22 DIAGNOSIS — Z23 Encounter for immunization: Secondary | ICD-10-CM

## 2022-02-22 MED ORDER — AMOXICILLIN 500 MG PO CAPS: 500.0000 mg | ORAL_CAPSULE | Freq: Three times a day (TID) | ORAL | 0 refills | Status: DC

## 2022-02-22 NOTE — Progress Notes (Signed)
xxxxxxxxxxxxxxxxxxxxx  OV 07/29/2019 -transfer of care to Dr. Chase Caller interstitial lung disease center.  Subjective:  Patient ID: [name], female , DOB: [birth year] , age 81 y.o. , MRN: [id number] , ADDRESS: [place] [address]   07/29/2019 -   Chief Complaint  Patient presents with   Follow-up    Pt states her breathing has become worse since last visit. States she also has an occ cough.   #Interstitial lung disease (progressive) with chronic hypoxemic respiratory failure - chronic HP dx -Per [facility] September 2016: Disease present at least since July 19, 2006  - Surgical lung biopsy  November 20, 2014 at [place]:   - local opinion: diagnostic of usual interstitial pneumonia (UIP). Interestingly, there are scattered non-necrotizing granulomata in the pleura.  -Second opinion September 2016 at [facility]: pathology was reviewed at our multidisciplinary ILD conference and noted to have a variegated pattern of fibrosis with some airway centricity, granulomas noted both near the airway and more peripherally. Minimal inflammation was noted. Features not c/w with UIP. Few giant cells with cholesterol clefts. Overall pattern most in keeping with chronic HP  -High-resolution CT chest including April 09, 2018: Lack of craniocaudal gradient and significant air trapping suggestive of chronic hypersensitivity pneumonitis [September July 19, 2014 [facility] evaluation thought CT was inconsistent with UIP]  -Clinical diagnosis of chronic hypersensitivity pneumonitis   - dx given Sept 2016 at [place]  -No clear-cut exposure identified at [facility] evaluation September 2016 by Dr. Dorothyann Peng  -Steroids recommended but patient preferred not to take it September 2016 at [place]   - deferred steroids sept Jul 19, 2014 visit at [facility] with Dr Sherri Sear  - nintedanib since March 2020   #Small hiatal hernia with acid reflux disease  HPI [name] 81 y.o. -is a  transfer of care from Dr. Lake Bells to Dr. Chase Caller with interstitial lung disease questionnaire.  She was last seen by Dr. Lake Bells in March 2020.  After the onset of the COVID-19 pandemic Dr. Lake Bells is now fully in charge of the Covid hospital [facility].  Therefore he is not in the outpatient medical practice anymore.  Therefore patient has been transferred to the ILD center to Dr. Chase Caller myself.  Is the first time I am meeting her and getting to know her.  She tells me that I took care of her husband for COPD a few years ago.  This was her ex-husband.  He passed away in 2011/07/20 after hospitalization to the ICU under our service.  She is grateful for the care.  She tells me that she has interstitial lung disease and for the last year she has been on nintedanib.  Currently she is having difficulty with nintedanib supply.  She only has 2 weeks left.  She uses 3 L of oxygen at rest.  She says she has been doing that for the last 4 years.  It is stable usage.  I am not really sure what her pulse ox on room air would be.  She says occasionally she would crank it up to 4 L for exertion but she generally uses 3 L both at rest and with exertion.  She says she tolerates nintedanib fine. Her interstitial lung disease history was reviewed and summarized above.  It appears that she was not keen on taking prednisone and her 2014-07-19 visit with Dr. Dorothyann Peng at [facility].  Therefore once the [facility] data was available she was started on nintedanib a year  ago.She wants to have a son involved in her visits.  Because of the COVID-19 pandemic he is waiting outside in the car.  She says at the next visit she wants to bring him in.   Her symptom score is listed below.  Her progressive lung function decline is also listed below and the pulmonary function test reviewed.  I personally reviewed and visualized and interpreted those.  Her DLCO is paradoxically higher.  I think this is an error.   ROS - per  HPI  OV 10/03/2019  Subjective:  Patient ID: [name], female , DOB: November 26, 1940 , age 37 y.o. 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 Interestingly, there are scattered non-necrotizing granulomata in the pleura.  -Second opinion September 2016 at [facility]: pathology was reviewed at our multidisciplinary ILD conference and noted to have a variegated pattern of fibrosis with some airway centricity, granulomas noted both near the airway and more peripherally. Minimal inflammation was noted. Features not c/w with UIP. Few giant cells with cholesterol clefts. Overall pattern most in keeping with chronic HP  -High-resolution CT chest including April 09, 2018: Lack of craniocaudal gradient and significant air trapping suggestive of chronic hypersensitivity pneumonitis [September 2016 [facility] evaluation thought CT was inconsistent with UIP]  -Clinical diagnosis of chronic hypersensitivity pneumonitis   - dx given Sept 2016 at [place]  -No clear-cut exposure identified at [facility] evaluation September 2016 by Dr. Dorothyann Peng  -Steroids recommended but patient preferred not to take it September 2016 at [place]   - deferred steroids sept 2016 visit at [facility] with Dr Sherri Sear  - nintedanib since March 2020   #Small hiatal hernia with acid reflux disease  10/03/2019 -   Chief Complaint  Patient presents with   Follow-up    SOB and cough with activity has increasd, Productive cough with yellow sputum     HPI [name] 81 y.o. -returns for follow-up with her son.  At this point in time the focus is to establish the true nature of interstitial  lung disease.  She has brought her son with her.  However approximately 2 weeks ago she had acute bronchitis symptoms and called in.  Was given a cephalexin 5 days and prednisone 5 days.  These have helped immensely.  However she feels not back to baseline.  She feels she will benefit from another round of antibiotic with or without prednisone.  She finished ILD questionnaire and it is listed below.  We have not been able to discuss in a multidisciplinary case conference of 4. [place] Integrated Comprehensive ILD Questionnaire  Symptoms: See below.    SYMPTOM SCALE - ILD 07/29/2019  10/03/2019   O2 use 3L at rest, occ 4L with exertion 3L at rest and 4L with exertion. Acute bronchitis 2 weeks ago  Shortness of Breath 0 -> 5 scale with 5 being worst (score 6 If unable to do)   At rest 0 1  Simple tasks - showers, clothes change, eating, shaving 1 2  Household (dishes, doing bed, laundry) 2 3  Shopping 2 2  Walking level at own pace 1 2  Walking up Stairs 6 4  Total (30-36)  Dyspnea Score 12 14  How bad is your cough? 1 3.5  How bad is your fatigue 0 3.5  How bad is nausea 0 0  How bad is vomiting?  0 0  How bad is diarrhea? 0 0  How bad is anxiety? 1 1.5  How bad is depression 1 1     Past Medical History :  -Denies any collagen vascular disease or vasculitis.  Denies any knowledge of prior pulmonary hypertension.  Denies diabetes or thyroid disease.  Denies any stroke.  Denies mononucleosis denies tuberculosis denies pneumonia denies blood clots denies heart disease denies pleurisy   ROS: Positive for shoulder pain for the last several years but otherwise no dry eyes.  No Raynaud's.  No weight loss.  No nausea no vomiting no rash   FAMILY HISTORY of LUNG DISEASE: Denies any pulmonary fibrosis or COPD or cystic fibrosis of hypersensitive pneumonitis or any other lung disease.   EXPOSURE HISTORY: Never smoked cigarettes.  No passive smoking.  No marijuana.  No vaping no cocaine no  intravenous drug use.   HOME and HOBBY DETAILS : Current home is 25 years she is lived there for 5 years.  In [place] there is no dampness.  No mold or mildew no feather pillow.  No steam iron use no Jacuzzi use no nebulizer use no humidifier use.  No pet birds or parakeets.  No pet gerbils no pet hamsters no pet rabbits no rodents.  No mold in the [facility] duct no music habits such as wind instruments no gardening.  No flood of water damage.  No strong mats.  No exposure to animals at work.  History update in April 2023: She reported that between 2010 2014 she lived in the mother's house that was built in the 1950s.  She thought there was mold in this but never confirmed.  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  She also had a long-term tendency to clean her home heavily with Clorox.  She inhaled these fumes according to the son.  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   PULMONARY TOXICITY HISTORY (27 items): She has done prednisone burst.  She does not want to do long-term prednisone.  She has been on long-term nintedanib but tolerating it well.    Testing -2018 echo with elevated pulmonary systolic pressure 55 mmHg  - nov 2019 Last CT - HRCT IMPRESSION: 1. Spectrum of findings compatible with fibrotic interstitial lung disease with extensive air trapping and absence of basilar gradient, strongly favoring chronic hypersensitivity pneumonitis. Findings have slightly progressed since 04/24/2017, with more clear progression since more remote chest CT of 06/24/2014. Findings are suggestive of an alternative diagnosis (not UIP) per consensus guidelines: Diagnosis of Idiopathic Pulmonary Fibrosis: An  Official ATS/ERS/JRS/ALAT Clinical Practice Guideline. [place], Iss 5, [phone], Feb 04 2017. 2. Stable mild mediastinal lymphadenopathy, most compatible with benign reactive adenopathy. 3. One vessel coronary atherosclerosis. 4. Small hiatal hernia.   Aortic Atherosclerosis (ICD10-I70.0).     Results for LUCELIA, LACEY (MRN [id number]) as of 07/29/2019 11:43  Ref. Range 07/04/2014 12:58 09/03/2015 11:04 09/27/2016 13:56 05/01/2017 14:05 07/19/2019 15:54  FVC-Pre Latest Units: L 2.13 1.93 1.86 1.71 1.61  FVC-%Pred-Pre Latest Units: % 66 61 59 55 53   Results for SHALONA, HARBOUR (MRN [id number]) as of 07/29/2019 11:43  Ref. Range 07/04/2014 12:58 09/03/2015 11:04 09/27/2016 13:56 05/01/2017 14:05 07/19/2019 15:54  DLCO unc Latest Units: ml/min/mmHg 13.70 14.70 12.69 11.23 17.11  DLCO unc % pred Latest Units: % 48 51 44 39 82     ROS  OV 12/03/2019  Subjective:  Patient ID: [name], female , DOB: Oct 24, 1940 , age 81 y.o. , MRN: [id number] , ADDRESS: [place] [address]   12/03/2019 -   Chief Complaint  Patient presents with   Follow-up    cough is better but still present with exertion, breathing about the same since last visit    #Interstitial lung disease (progressive) with chronic hypoxemic respiratory failure - chronic HP dx -Per [facility] September 2016: Disease present at least since 2008  - Surgical lung biopsy  November 20, 2014 at [place]:   - local opinion: diagnostic of usual interstitial pneumonia (UIP). Interestingly, there are scattered non-necrotizing granulomata in the pleura.  -Second opinion September 2016 at [facility]: pathology was reviewed at our multidisciplinary ILD conference and noted to have a variegated pattern of fibrosis with some airway centricity, granulomas noted both near the airway and more peripherally. Minimal inflammation was noted. Features not c/w with UIP. Few giant  cells with cholesterol clefts. Overall pattern most in keeping with chronic HP   - 3rd opinon: June 2021 [facility] IL[facility] [place] - HP   -High-resolution CT chest including April 09, 2018: Lack of craniocaudal gradient and significant air trapping suggestive of chronic hypersensitivity pneumonitis [September 2016 [facility] evaluation thought CT was inconsistent with UIP]  -Clinical diagnosis of chronic hypersensitivity pneumonitis   - dx given Sept 2016 at [place]  -No clear-cut exposure identified at [facility] evaluation September 2016 by Dr. Dorothyann Peng  -Steroids recommended but patient preferred not to take it September 2016 at [place]   - deferred steroids sept 2016 visit at [facility] with Dr Sherri Sear  - nintedanib since March 2020   #Small hiatal hernia with acid reflux disease   HPI [name] 81 y.o. -presents with her son for follow-up.  She continues to be on 3 L at rest 4 L with exertion.  Her acute bronchitis from April 2021 is resolved.  Her overall symptoms are stable/slightly better after her acute bronchitis.  There is no interim complaints.  In the interim we did discuss her at her case conference.  The consensus diagnosis is that she has hypersensitive pneumonitis.  The only exposure is when she was working in the [place] at in [place].  She and her son again denies any ongoing exposures.  Reviewed her pulmonary function test and it is stable compared to the recent ones.  There is progression over the last few years to several years.  In terms of a high-resolution CT chest is also stable since 2019 through 2021 but progressive when compared to earlier time points.  She was started on nintedanib in March 2020.  She is tolerating this quite well.  It appears that she and she went on nintedanib she is stable.  She yet to do pulmonary rehabilitation.  Last done few years ago.  Son feels that she needs to be more mobile She is yet to connect with the support group She did not get  referred to cardiology for right heart  catheterization. She wants a flutter valve for her associated bronchiectasis seen on CT scan    OV 03/09/2020   Subjective:  Patient ID: [name], female , DOB: 1940/10/08, age 82 y.o. years. , MRN: [id number],  ADDRESS: [place] [address] PCP  Lawerance Cruel, MD Providers : Treatment Team:  Attending Provider: Brand Males, MD   Chief Complaint  Patient presents with   Follow-up    Patient wears 4 liters oxygen with exertion, was prescribed celebrex they want to know if it is ok with OFEV.      #Interstitial lung disease (progressive) with chronic hypoxemic respiratory failure - chronic HP dx -Per [facility] September 2016: Disease present at least since 2008  - Surgical lung biopsy  November 20, 2014 at [place]:   - local opinion: diagnostic of usual interstitial pneumonia (UIP). Interestingly, there are scattered non-necrotizing granulomata in the pleura.  -Second opinion September 2016 at [facility]: pathology was reviewed at our multidisciplinary ILD conference and noted to have a variegated pattern of fibrosis with some airway centricity, granulomas noted both near the airway and more peripherally. Minimal inflammation was noted. Features not c/w with UIP. Few giant cells with cholesterol clefts. Overall pattern most in keeping with chronic HP   - 3rd opinon: June 2021 [facility] [place] - Hypersensitivity Pneumonitis   -High-resolution CT chest including April 09, 2018: Lack of craniocaudal gradient and significant air trapping suggestive of chronic hypersensitivity pneumonitis [September 2016 [facility] evaluation thought CT was inconsistent with UIP]  -Clinical diagnosis of chronic hypersensitivity pneumonitis   - dx given Sept 2016 at [place]  -No clear-cut exposure identified at [facility] evaluation September 2016 by Dr. Dorothyann Peng  -Steroids recommended but patient preferred not to  take it September 2016 at [place]   - deferred steroids sept 2016 visit at [facility] with Dr Dorothyann Peng and again at [facility] in June 2021  - nintedanib sole Rx since March 2020     HPI [name] 81 y.o. -returns for follow-up with her son Simona Huh.  Since her last visit in June 2020 when she continues to be stable.  She is enrolled in the pulmonary rehabilitation program which has helped her fatigue and shortness of breath significantly.  She continues on nintedanib without any adverse side effects.  She wanted to know if it is okay to take cephalexin for arthralgia.  She has chronic osteoarthritis.  She wants to avoid NSAIDs.  In terms of oxygen use she is stable at 4 L nasal cannula.  She recently had pulmonary function test I reviewed this and it shows stability and is documented below.  Her weight continues to be stable.  We referred her for right heart catheterization with because of the Covid delta surge she deferred it.  Of note on February 28, 2020 while trying to get into the car out of her primary care physician's office she fell down and sustained abrasions and bruises injuries particularly in her lower extremity.  Since then her right kneecap patella area is red and warm.  There is a small incision injury there but this is healed.  She thinks it slightly better/stable.  She is not having any fever.  This has not been treated.      ROS - per HPI  CT chest high resolution - may 2021  IMPRESSION: 1. Spectrum of findings compatible with fibrotic interstitial lung disease with moderate air trapping and absence of apicobasilar gradient. Findings  are most compatible with chronic hypersensitivity pneumonitis. No interval progression since 2019 chest CT. Evidence of progression since baseline 2016 chest CT. Findings are suggestive of an alternative diagnosis (not UIP) per consensus guidelines: Diagnosis of Idiopathic Pulmonary Fibrosis: An Official ATS/ERS/JRS/ALAT  Clinical Practice Guideline. [place], Iss 5, [phone], Feb 04 2017. 2. Stable mild mediastinal lymphadenopathy, compatible with benign reactive adenopathy. 3. Small hiatal hernia. 4. Aortic Atherosclerosis (ICD10-I70.0).     Electronically Signed   By: Ilona Sorrel M.D.   On: 10/24/2019 15:02    OV 09/24/2020  Subjective:  Patient ID: [name], female , DOB: 06/28/1940 , age 38 y.o. , MRN: [id number] , ADDRESS: [place] [address] PCP Lawerance Cruel, MD Patient Care Team: Lawerance Cruel, MD as PCP - General (Family Medicine) Tamsen Roers, MD as Referring Physician (Family Medicine) Melrose Nakayama, MD as Consulting Physician (Cardiothoracic Surgery)  This Provider for this visit: Treatment Team:  Attending Provider: Brand Males, MD    09/24/2020 -   Chief Complaint  Patient presents with   Follow-up    Review PFT results. Still reports cough, sometimes productive with clear phlegm.      #Interstitial lung disease (progressive) with chronic hypoxemic respiratory failure - chronic HP dx -Per [facility] September 2016: Disease present at least since 2008  - Surgical lung biopsy  November 20, 2014 at [place]:   - local opinion: diagnostic of usual interstitial pneumonia (UIP). Interestingly, there are scattered non-necrotizing granulomata in the pleura.  -Second opinion September 2016 at [facility]: pathology was reviewed at our multidisciplinary ILD conference and noted to have a variegated pattern of fibrosis with some airway centricity, granulomas noted both near the airway and more peripherally. Minimal inflammation was noted. Features not c/w with UIP. Few giant cells with cholesterol clefts. Overall pattern most in keeping with chronic HP   - 3rd opinon: June 2021 [facility] [place] - Hypersensitivity Pneumonitis   -High-resolution CT chest including April 09, 2018: Lack of  craniocaudal gradient and significant air trapping suggestive of chronic hypersensitivity pneumonitis [September 2016 [facility] evaluation thought CT was inconsistent with UIP]  - May 2021: stability since 2019 but progression sice 2016  -Clinical diagnosis of chronic hypersensitivity pneumonitis   - dx given Sept 2016 at [place]  -No clear-cut exposure identified at [facility] evaluation September 2016 by Dr. Dorothyann Peng  -Steroids recommended but patient preferred not to take it September 2016 at [place]   - deferred steroids sept 2016 visit at [facility] with Dr Dorothyann Peng and again at [facility] in June 2021, Apr 2022  - nintedanib sole Rx since March 2020       HPI [name] 81 y.o. -presents for follow-up.  Last seen in the fall 2021.  After that overall she is been stable but last month she picked up a respiratory infection got antibiotic and prednisone and then she is better.  Nevertheless she thinks her symptoms may be a little worse than baseline.  Still she says she gets by with 3-4 L of oxygen which is her baseline.  Subjective symptom score overall has not changed.  She had pulmonary function test today and this shows a decline it is all documented below.  I visualized this.  Her last CT scan of the chest was in May 2021.  1 year anniversary of the CT scan is coming up.  Over the winter she declined  to have right heart catheterization because of COVID-19.  But at this point in time with the improvement in the pandemic prevalence rate in the local community of COVID she is willing to have a right heart catheterization.  She is tolerating nintedanib just fine without any problems.  A walking desaturation test on room air is as below      OV 10/27/2020  Subjective:  Patient ID: [name], female , DOB: Feb 03, 1941 , age 22 y.o. , MRN: [id number] , ADDRESS: [place] [address] PCP Lawerance Cruel, MD Patient Care Team: Lawerance Cruel,  MD as PCP - General (Family Medicine) Larey Dresser, MD as PCP - Advanced Heart Failure (Cardiology) Tamsen Roers, MD as Referring Physician (Family Medicine) Melrose Nakayama, MD as Consulting Physician (Cardiothoracic Surgery)  This Provider for this visit: Treatment Team:  Attending Provider: Brand Males, MD   #Interstitial lung disease (progressive) with chronic hypoxemic respiratory failure - chronic HP dx -Per [facility] September 2016: Disease present at least since 2008  - Surgical lung biopsy  November 20, 2014 at [place]:   - local opinion: diagnostic of usual interstitial pneumonia (UIP). Interestingly, there are scattered non-necrotizing granulomata in the pleura.  -Second opinion September 2016 at [facility]: pathology was reviewed at our multidisciplinary ILD conference and noted to have a variegated pattern of fibrosis with some airway centricity, granulomas noted both near the airway and more peripherally. Minimal inflammation was noted. Features not c/w with UIP. Few giant cells with cholesterol clefts. Overall pattern most in keeping with chronic HP   - 3rd opinon: June 2021 [facility] [place] - Hypersensitivity Pneumonitis   -High-resolution CT chest including April 09, 2018: Lack of craniocaudal gradient and significant air trapping suggestive of chronic hypersensitivity pneumonitis [September 2016 [facility] evaluation thought CT was inconsistent with UIP]  - May 2021: stability since 2019 but progression sice 2016  -Clinical diagnosis of chronic hypersensitivity pneumonitis   - dx given Sept 2016 at [place]  -No clear-cut exposure identified at [facility] evaluation September 2016 by Dr. Dorothyann Peng  -Steroids recommended but patient preferred not to take it September 2016 at [place]   - deferred steroids sept 2016 visit at [facility] with Dr Dorothyann Peng and again at [facility] in June 2021, Apr 2022  - nintedanib sole Rx since March  2020    10/27/2020 -  TELEPHONE CALL  Type of visit: Telephone/Video Circumstance: COVID-19 national emergency Identification of patient [name] with 18-Jun-1940 and MRN [id number] - 2 person identifier Risks: Risks, benefits, limitations of telephone visit explained. Patient understood and verbalized agreement to proceed Anyone else on call:  Patient location: [phone] This provider location: [address], [facility], [place], [place]   HPI Rhena AYLIN RHOADS 81 y.o. -this telephone visit is for a few reasons  1)  grandson getting married in July in [place] and [facility] is elevated. Wants to go. Son Denies thiniong of taking her to [facility]. It is 4000 feet + /1300 meters. Advised them [place] is safeer from elevation  2) discussed heart catheterization results which are essentially normal.  3) discussed high-resolution CT chest because of concern of progression but in the last 1 year her ILD is stable on nintedanib.  She is happy about this  4) discuss future therapeutic options: At this point in time she is maxed out on therapy.  Not interested in prednisone.  She is  on antifibrotic nintedanib.  We discussed the pulse inhaled nitric oxide study phase 3.  She is very interested in this.  We discussed about the device and also convenience and buying her oxygen backpack called [employer].  I believe she has a copy of the consent for the study.  We discussed the principles of this.  She is very interested.  She wants to hear from the research coordinator.  I have sent the research coordinator Lazaro Arms message.  Advised may be a few weeks.   Heart Cath 10/14/20  1. Minimally elevated PA pressure with PVR only 2.1.  2. Normal filling pressures.  3. No significant coronary disease.  RHC Procedural Findings: Hemodynamics (mmHg) RA mean 4 RV 35/3 PA 36/8, mean 18 PCWP mean 6 LV 165/12 AO 161/69  Oxygen saturations: PA 76% AO 100%  Cardiac Output  (Fick) 5.73  Cardiac Index (Fick) 2.69  PVR 2.1    RESEARCH VISI 08/25/2021     Title:  A Randomized, double-blind, placebo-controlled dose escalation and verification clinical study, to assess the safety and efficacy of pulsed, inhaled nitric oxide (iNO) in subjects at risk of pulmonary hypertension associated with pulmonary fibrosis on long term oxygen therapy (Part 1 and Part 2). Primary Endpoint: The placebo-corrected change for INOpulse in minutes of moderate to vigorous physical activity (MVPA) measured by actigraphy from baseline to month 4.  Duration of treatment: Study participants will receive iNO 45 mcg/kg IBW/hr versus placebo for 4 months (16 weeks) during Part 1. Then in Part 2: Open Label Extension (OLE) Study participants will be offered open label therapy at iNO 45 mcg/kg IBW/hr after completing the Part 1.  Protocol #: PULSE-PHPF-001 (REBUILD), ClinicalTrials.gov Identifier: [id number], **Sponsor is [employer], [place], [place] [address])     Xxxx  This visit for Subject GRATIA DISLA with DOB: July 25, 1940 on 08/25/2021 for the above protocol is Visit/Encounter # OLE  and is for purpose of wihtdrawing frfom study . Subject/LAR expressed continued interest and consent in continuing as a study subject. Subject thanked for participation in research and contribution to science.   S: In the study visit.  She presents with her son.  She tells me that she is done with the study.  She does not want to continue with the study anymore.  Recently she has been noncompliant with using the inhaled nitric oxide device.  I spoke to her and she said after that she was completely compliant.  However this was giving an awful taste [dysgeusia].  She did not feel any benefit.  So she stopped it 24 hours ago.  She is only using it at night.  She also wants to withdraw from the study.  Per the protocol open label patients who stop using the study drug do not need to actively  follow-up but will only be followed up for collection of vital status.  I tried to encourage her to continue to participate in the study.  Did indicate to her that her pulmonary function test had stabilized but she is not interested at all.  Other issues - Interstitial lung disease: She feels it is overall stable.  3 L at rest 4 L with exertion.  Pulmonary function test most recently the [facility] is down but it seemed to improve while she was inhaled nitric oxide.  The DLCO is also stable.  She is tolerating nintedanib well without any problems.  She continues to be able to do her groceries with walking with a walker.  She  did have a high-resolution CT chest in January 2023.  Radiologist has not commented on any progression.  I written to the radiologist  #Weight loss: She believes she is lost a lot of weight.  The weight loss profile as below.  She says this is intentional.  I did indicate to her that nintedanib can make people lose weight but she believes it is all intentional.  She feels she is in a healthy position right now.  Therefore she wants to still continue nintedanib  #History of fall.:  She fell on 1 month ago.  She just had a mild bruise to her right knee but did not need any medication.  No injury.  It was a trip and fall when oxygen was delivered.  #Recurrent bronchitis/cough: Saw nurse practitioner acutely 05/26/2021 with productive cough increased mucus buildup that was green and yellow.  She was given prednisone taper and Augmentin.  Chest x-ray at that time showed stable ILD.  She followed up with nurse practitioner 06/14/2021 reporting while on antibiotic and prednisone she was better but then she started deteriorating again but this time did not have any fever but did have yellow and white secretions.  Patient was given a prednisone taper and a CT chest was ordered.  The high-resolution CT chest shows ILD but radiologist has not commented on progression.  She then followed up with nurse  practitioner 06/28/2021.  She was using 3% saline.  After that she is following with me at this visit.  She is saying she continues to have cough.  She feels she needs another course of 8-day amoxicillin.  She is very specific about the amount of oxacillin she needs.  She does not like prednisone but is open to taking a short course of prednisone.  We agreed on a 5-day taper.    OV 09/28/2021  Subjective:  Patient ID: [name], female , DOB: Feb 02, 1941 , age 61 y.o. , MRN: [id number] , ADDRESS: [place] [address] PCP Lawerance Cruel, MD Patient Care Team: Lawerance Cruel, MD as PCP - General (Family Medicine) Larey Dresser, MD as PCP - Advanced Heart Failure (Cardiology) Tamsen Roers, MD as Referring Physician (Family Medicine) Melrose Nakayama, MD as Consulting Physician (Cardiothoracic Surgery)  This Provider for this visit: Treatment Team:  Attending Provider: Brand Males, MD    09/28/2021 -   Chief Complaint  Patient presents with   Follow-up    PFT performed with research 08/25/21. Pt states that she has had a cough as well as some congestion. Also has some wheezing.     #Interstitial lung disease (progressive) with chronic hypoxemic respiratory failure - chronic HP dx -Per [facility] September 2016: Disease present at least since 2008  - Surgical lung biopsy  November 20, 2014 at [place]:   - local opinion: diagnostic of usual interstitial pneumonia (UIP). Interestingly, there are scattered non-necrotizing granulomata in the pleura.  -Second opinion September 2016 at [facility]: pathology was reviewed at our multidisciplinary ILD conference and noted to have a variegated pattern of fibrosis with some airway centricity, granulomas noted both near the airway and more peripherally. Minimal inflammation was noted. Features not c/w with UIP. Few giant cells with cholesterol clefts. Overall pattern most in keeping with  chronic HP   - 3rd opinon: June 2021 [facility] ILD [place] - Hypersensitivity Pneumonitis   -High-resolution CT chest including April 09, 2018: Lack of craniocaudal gradient and significant air trapping suggestive of chronic  hypersensitivity pneumonitis [September 2016 [facility] evaluation thought CT was inconsistent with UIP]  - May 2021: stability since 2019 but progression sice 2016  -Clinical diagnosis of chronic hypersensitivity pneumonitis   - dx given Sept 2016 at [place]  -No clear-cut exposure identified at [facility] evaluation September 2016 by Dr. Dorothyann Peng  -Steroids recommended but patient preferred not to take it September 2016 at [facility]   -Normal right heart catheterization May 2022  -Treatment   - deferred steroids sept 2016 visit at [facility] with Dr Dorothyann Peng and again at [facility] in June 2021, Apr 2022  - nintedanib sole Rx since March 2020  -Inhaled nitric oxide versus placebo research protocol: Withdrawal consent and follow-up March 2023  HPI [name] 81 y.o. -here for follow-up.  Presents with son Simona Huh she is now off the inhaled nitric oxide protocol.  She continues to take nintedanib.  Overall she feels stable.  She continues on 3-4 L of oxygen.  Nevertheless she continues to complain about recurrent sinus or bronchitis issues.  Review of the labs indicate that she has had eosinophilia in 2018.  No blood allergy work-up although mold hypersensitive pneumonitis panel has been negative.  No prior IgE evaluation.  She says that she got an infection in December 2023 and since then she is having recurrent sinusitis or bronchitis.  Currently with the pollen levels she feels she is having a sinusitis.  In March 2023 I gave her amoxicillin and prednisone.  She wants the same again.  In mid May 2023 she is going to visit her uncle and also go for a wedding in [place] in the [place] area and she will be back in early June 2023.  Her son Simona Huh is going to take  her.  She wants to make sure she gets an antibiotic and prednisone before the trip as well because she wants to feel good for the trip.  Her most recent pulmonary function test was in March 2023.  Her symptom score is detailed below recently stable but over time it is worse.        HRCT Jan 2023  Narrative & Impression  CLINICAL DATA:  Interstitial lung disease.   EXAM: CT CHEST WITHOUT CONTRAST   TECHNIQUE: Multidetector CT imaging of the chest was performed following the standard protocol without intravenous contrast. High resolution imaging of the lungs, as well as inspiratory and expiratory imaging, was performed.   RADIATION DOSE REDUCTION: This exam was performed according to the departmental dose-optimization program which includes automated exposure control, adjustment of the mA and/or kV according to patient size and/or use of iterative reconstruction technique.   COMPARISON:  09/29/2020, 10/24/2019 and 04/09/2018.   FINDINGS: Cardiovascular: Atherosclerotic calcification of the aorta, aortic valve and coronary arteries. Pulmonic trunk and heart are enlarged. No pericardial effusion.   Mediastinum/Nodes: Low-attenuation right thyroid nodule measures approximately 2.0 cm. Streak artifact from a right shoulder arthroplasty degrades image quality in this area. No pathologically enlarged mediastinal or axillary lymph nodes. Hilar regions are difficult to evaluate without IV contrast. Esophagus is grossly unremarkable.   Lungs/Pleura: Traction bronchiectasis/bronchiolectasis, architectural distortion, subpleural reticular densities and ground-glass, similar to prior exams. No definite zonal predominance. Biopsy suture line in the inferior aspect of the anterior segment right upper lobe. No pleural fluid. Airway is unremarkable. There is air trapping.   Upper Abdomen: Visualized portion of the liver is unremarkable. Stones in the gallbladder. Visualized portions of  the adrenal glands, kidneys, spleen, pancreas, stomach and  bowel are unremarkable with the exception of a small hiatal hernia. No upper abdominal adenopathy.   Musculoskeletal: Degenerative changes in the spine. Right shoulder arthroplasty. No worrisome lytic or sclerotic lesions.   IMPRESSION: 1. 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 Findings are suggestive of an alternative diagnosis (not UIP) per consensus guidelines: Diagnosis of Idiopathic Pulmonary Fibrosis: An Official ATS/ERS/JRS/ALAT Clinical Practice Guideline. [place], Iss 5, [phone], Feb 04 2017. 2. Cholelithiasis. 3. 2.0 cm low-attenuation right thyroid nodule. Recommend thyroid ultrasound. (Ref: J Am Coll Radiol. 2015 Feb;12(2): 143-50). 4. Aortic atherosclerosis (ICD10-I70.0). Coronary artery calcification. 5. Enlarged pulmonic trunk, indicative of pulmonary arterial hypertension.     Electronically Signed   By: Lorin Picket M.D.   On: 06/23/2021 10:44         OV 11/25/2021  Subjective:  Patient ID: [name], female , DOB: Jul 20, 1940 , age 66 y.o. , MRN: [id number] , ADDRESS: [place] [address] PCP Lawerance Cruel, MD Patient Care Team: Lawerance Cruel, MD as PCP - General (Family Medicine) Larey Dresser, MD as PCP - Advanced Heart Failure (Cardiology) Tamsen Roers, MD as Referring Physician (Family Medicine) Melrose Nakayama, MD as Consulting Physician (Cardiothoracic Surgery)  This Provider for this visit: Treatment Team:  Attending Provider: Brand Males, MD    #Interstitial lung disease (progressive) with chronic hypoxemic respiratory failure - chronic HP dx -Per [facility] September 2016: Disease present at least since 2008  - Surgical lung biopsy  November 20, 2014 at [place]:   - local opinion:  diagnostic of usual interstitial pneumonia (UIP). Interestingly, there are scattered non-necrotizing granulomata in the pleura.  -Second opinion September 2016 at [facility]: pathology was reviewed at our multidisciplinary ILD conference and noted to have a variegated pattern of fibrosis with some airway centricity, granulomas noted both near the airway and more peripherally. Minimal inflammation was noted. Features not c/w with UIP. Few giant cells with cholesterol clefts. Overall pattern most in keeping with chronic HP   - 3rd opinon: June 2021 [facility] ILD [place] - Hypersensitivity Pneumonitis   -High-resolution CT chest including April 09, 2018: Lack of craniocaudal gradient and significant air trapping suggestive of chronic hypersensitivity pneumonitis [September 2016 [facility] evaluation thought CT was inconsistent with UIP]  - May 2021: stability since 2019 but progression sice 2016  -Clinical diagnosis of chronic hypersensitivity pneumonitis   - dx given Sept 2016 at [place]  -No clear-cut exposure identified at [facility] evaluation September 2016 by Dr. Dorothyann Peng  -Steroids recommended but patient preferred not to take it September 2016 at [facility]   -Positive RAST allergy panel with blood eosinophilia in April 2023   - Started Symbicort June 2023  -Normal right heart catheterization May 2022  -Unintentional weight loss noted in 2023  = Presumed secondary to nintedanib  -Treatment   - deferred steroids sept 2016 visit at [facility] with Dr Dorothyann Peng and again at [facility] in June 2021, Apr 2022  - nintedanib sole Rx since March 2020  -Inhaled nitric oxide versus placebo research protocol: Withdrawal consent and follow-up March 2023  (study results launched in late spring 2023 and negative]  11/25/2021 -   Chief Complaint  Patient presents with   Follow-up    PFT performed today.  Pt states she has been doing okay since last visit. States she has had congestion and has not felt that  great since last visit.     HPI [name] 81 y.o. -presents for follow-up with her son Simona Huh.  Overall she is stable.  She continues to use 3.5 L at rest and 5-6 L with exertion.  She states this is stable.  She continues to lose weight.  It is unintentional.  She is on nintedanib.  She says her primary care has ruled out other causes.  At this point in time she is still technically overweight so she is content losing weight.  Overall there is no appetite.  She still frustrated by the recurrent acute sinusitis.  She gets exposed to sick grandkids she feels that she is having sinus congestion right now with blockage in the last few weeks and intermittent white-yellow drainage.  She does not Nettie pot she has occasional wheezing.  Her blood eosinophils are quite elevated at 5 8 cells per cubic millimeter.  RAST allergy panel is positive.  She wants to do another course of antibiotic but she does not want to do prednisone because of intolerance.  After her blood results came back I advised [place].  But at that time I did not realize she was not on inhaled corticosteroids.  Anyway she prefers to do inhaler first approach.  From a dyspnea standpoint she is stable.  She had pulmonary function test that is now stable for 1 year but is progressive over many years.  She prefers to continue her nintedanib at this point.  She does not want to do oral prednisone     CT Chest data    OV 02/22/2022  Subjective:  Patient ID: [name], female , DOB: 1940-10-14 , age 70 y.o. , MRN: [id number] , ADDRESS: [place] [address] PCP Lawerance Cruel, MD Patient Care Team: Lawerance Cruel, MD as PCP - General (Family Medicine) Larey Dresser, MD as PCP - Advanced Heart Failure (Cardiology) Tamsen Roers, MD as Referring Physician (Family Medicine) Melrose Nakayama, MD as Consulting Physician (Cardiothoracic Surgery)  This Provider for this visit: Treatment  Team:  Attending Provider: Brand Males, MD    02/22/2022 -   Chief Complaint  Patient presents with   Follow-up    No c/o dry cough      HPI [name] 81 y.o. -returns for follow-up.  Presents with her son Simona Huh.  Overall stable still continuing on 4 L nasal cannula but 3 weeks ago started having another bronchitic episode along with conjunctivitis symptoms or rhinitis.  A lot of cough and yellow mucus.  Slowly getting back to baseline as usual did not do prednisone.  She is tolerating her nintedanib well.  She continues on 4 L oxygen.  She keeps getting these recurrent bronchitic symptoms.  She has positive blood use and feels an elevated RAST allergy testing.  She wants another antibiotic to keep it handy.  She has not had a respiratory vaccines.  We will clear her biologic therapy particularly Berna Bue she is afraid of needles.  After much back-and-forth decision.  Her son is a Software engineer she has agreed to try [place].  Also of note she was able to get an echocardiogram but she states our office never scheduled it.  Of note last time I gave her Symbicort because of asthma symptoms but she is not taking it.  She believes she did not tolerate it.     SYMPTOM SCALE - ILD 07/29/2019  10/03/2019  12/03/2019 217# 03/09/2020  09/24/2020 207# 08/25/2021 -  withdrew from iNP sutdy Weight 201# 09/28/2021 - 202#  11/25/2021 - 193#  02/22/2022 194#  O2 use 3L at rest, occ 4L with exertion 3L at rest and 4L with exertion. Acute bronchitis 2 weeks ago 3L rest, 4 L exertion 3-4L [place]. Doing rehab 3/4L [place] pulse . On ofev 3L rest, 4L with eretiion. Weight 201# Treated oxygen with rest and 4 L exertion and pulse 3.5L rest, 4-5L exertion 4L Max   Shortness of Breath 0 -> 5 scale with 5 being worst (score 6 If unable to do)          At rest 0 1 0 0 0 1 0 1 1  Simple tasks - showers, clothes change, eating, shaving _0 Household (dishes, doing bed, laundry) _1 2._2 Shopping _3 2._4 Walking level at own pace _5 Walking up Stairs _6 Total (30-36) Dyspnea Score _7 How bad is your cough? 1 3.5  More often lateley _8 How bad is your fatigue 0 3.5 2 Better with exercise 2._9 How bad is nausea 0 0 0 0 0 0 0 0 0  How bad is vomiting?  0 0 0 0 0 00 0 0 0  How bad is diarrhea? 0 0 0 0 0 0 0 0 0  How bad is anxiety? 1 1._10 00 _11 How bad is depression _12 0 0 _13 Simple office walk 185 feet x  3 laps goal with forehead probe 09/24/2020  09/28/2021   O2 used ra (baseline uses 3-4L at home) ra  Number laps completed End of 1 lap out of intented 3 Stoppsed at 3/4 of 1st lap  Comments about pace slow avg with walker  Resting Pulse Ox/HR 94% and 87/min 95% and HR 83  Final Pulse Ox/HR 85% and 113/min 82% and HR 89  Desaturated </= 88% yes   Desaturated <= 3% points yes   Got Tachycardic >/= 90/min yes   Symptoms at end of test Mod dyspnea   Miscellaneous comments desat at end iof 1 lap. STable on room air    CT Chest data  No results found.    PFT     Latest Ref Rng & Units 11/25/2021    3:10 PM 09/24/2020    2:56 PM 12/03/2019   11:39 AM 12/03/2019    9:57 AM 07/19/2019    3:54 PM 05/01/2017    2:05 PM 09/27/2016    1:56 PM  PFT Results  FVC-Pre L 1.58  1.52  1.70  1.66  1.61  1.71  1.86   FVC-Predicted Pre % 54  50  57  55  53  55  59   FVC-Post L     1.87  1.72  1.91   FVC-Predicted Post %     61  55  61   Pre FEV1/FVC % % 79  80  81  82  81  87  86   Post FEV1/FCV % %     78  90  89   FEV1-Pre L 1.25  1.21  1.39  1.36  1.31  1.50  1.60   FEV1-Predicted Pre % 57  54  62  60  57  64  68   FEV1-Post L     1.46  1.56  1.70   DLCO uncorrected ml/min/mmHg 11.86  5.95  15.25  13.20  17.11  11.23  12.69   DLCO UNC% % 57  28  73  63  82  39  44   DLCO corrected ml/min/mmHg 11.97  5.95  15.25  13.20   10.63  12.07   DLCO COR %Predicted % 58  28   73  63   37  42   DLVA Predicted % 94  67  114  103  120  70  80   TLC L     3.63  3.37  3.49   TLC % Predicted %     65  61  63   RV % Predicted %     73  63  64        has a past medical history of Allergic rhinitis, Anxiety, Arthritis, Asthma, Carpal tunnel syndrome, CHF (congestive heart failure) ([place]), GERD (gastroesophageal reflux disease), History of bronchitis (3-59yr ago), History of colon polyps, Joint pain, Peripheral edema, Pneumonitis, hypersensitivity [place] ([place]), [place] (06/30/2017), Shortness of breath dyspnea, and Thyroid nodule.   reports that she has never smoked. She has never used smokeless tobacco.  Past Surgical History:  Procedure Laterality Date   BREAST CYST EXCISION Left 50 yrs ago   CARPELL TUNNELL SURG Right    COLONOSCOPY     ESOPHAGOGASTRODUODENOSCOPY     EYE SURGERY     bilateral cataract surgery with lens implants   KNEE ARTHROSCOPY W/ MENISCAL REPAIR     left   LUNG BIOPSY Right 11/20/2014   Procedure: LUNG BIOPSY;  Surgeon: SMelrose Nakayama MD;  [facility]  Service: Thoracic;  Laterality: Right;   REVERSE SHOULDER ARTHROPLASTY Right 06/07/2018   Procedure: REVERSE SHOULDER ARTHROPLASTY;  Surgeon: SJustice Britain MD;  Location: [facility];  Service: Orthopedics;  Laterality: Right;  1239m   RIGHT/LEFT HEART CATH AND CORONARY ANGIOGRAPHY N/A 10/14/2020   Procedure: RIGHT/LEFT HEART CATH AND CORONARY ANGIOGRAPHY;  Surgeon: McLarey DresserMD;  [facility];  Service: Cardiovascular;  Laterality: N/A;   TOTAL KNEE ARTHROPLASTY Left 08/13/2013   Procedure: LEFT TOTAL KNEE ARTHROPLASTY;  Surgeon: MaMauri PoleMD;  Location: [facility];  Service: Orthopedics;  Laterality: Left;   UMBILICAL HERNIA REPAIR     VIDEO ASSISTED THORACOSCOPY Right 11/20/2014   Procedure: RIGHT  VIDEO ASSISTED THORACOSCOPY WITH WEDGE LUNG BIOPSIES RIGHT UPPER,MIDDLE AND LOWER LOBES ,PLACEMENT OF ON Q PAIN PUMP;  Surgeon: StMelrose NakayamaMD;  [facility] Service: Thoracic;  Laterality: Right;    Allergies  Allergen Reactions   Adhesive [Tape] Itching and Rash   Latex Itching and Swelling   Doxycycline Rash   Sulfonamide Derivatives Other (See Comments)    Unknown    Immunization History  Administered Date(s) Administered   Fluad Quad(high Dose 65+) 02/07/2019, 05/06/2021   Influenza, High Dose Seasonal PF 04/20/2016, 02/23/2017, 02/12/2018   Influenza-Unspecified 03/06/2014, 02/18/2015   PFIZER(Purple Top)SARS-COV-2 Vaccination 07/12/2019, 08/06/2019, 04/12/2020   Pneumococcal Conjugate-13 04/14/2014   Pneumococcal Polysaccharide-23 06/14/2017   Tdap 06/14/2017   Zoster, Live 04/24/2013    Family History  Problem Relation Age of Onset   Tuberculosis Father    Tuberculosis Paternal Uncle  Current Outpatient Medications:    amoxicillin (AMOXIL) 500 MG capsule, Take 1 capsule (500 mg total) by mouth 3 (three) times daily., Disp: 15 capsule, Rfl: 0   aspirin 325 MG tablet, Take 650 mg by mouth daily as needed for moderate pain or headache., Disp: , Rfl:    budesonide-formoterol (SYMBICORT) 80-4.5 MCG/ACT inhaler, Inhale 2 puffs into the lungs in the morning and at bedtime., Disp: 10.2 g, Rfl: 12   Cholecalciferol (DIALYVITE VITAMIN D 5000) 125 MCG (5000 UT) capsule, Take 5,000 Units by mouth daily., Disp: , Rfl:    fluticasone (FLONASE) 50 MCG/ACT nasal spray, Place 1 spray into both nostrils daily as needed for allergies. , Disp: , Rfl:    furosemide (LASIX) 20 MG tablet, as needed., Disp: , Rfl:    ibuprofen (ADVIL) 200 MG tablet, Take 200-400 mg by mouth every 6 (six) hours as needed for headache or moderate pain., Disp: , Rfl:    loratadine (CLARITIN) 5 MG chewable tablet, Chew 5-10 mg by mouth daily as needed for allergies. , Disp: , Rfl:    Multiple Vitamin (MULTIVITAMIN WITH MINERALS) TABS tablet, Take 1 tablet by mouth daily., Disp: , Rfl:    Nintedanib (OFEV) 150 MG CAPS, TAKE 1  CAPSULE BY MOUTH TWICE DAILY, Disp: 60 capsule, Rfl: 11   Respiratory Therapy Supplies (FLUTTER) DEVI, Use as directed., Disp: 1 each, Rfl: 0   sodium chloride HYPERTONIC 3 % nebulizer solution, Take by nebulization in the morning and at bedtime., Disp: 750 mL, Rfl: 1   VENTOLIN HFA 108 (90 Base) MCG/ACT inhaler, INHALE 2 PUFFS INTO THE LUNGS EVERY 6 HOURS AS NEEDED FOR WHEEZING OR SHORTNESS OF BREATH, Disp: 18 g, Rfl: 6   vitamin B-12 (CYANOCOBALAMIN) 1000 MCG tablet, Take 1,000 mcg by mouth every other day., Disp: , Rfl:       Objective:   Vitals:   02/22/22 1504  BP: 138/76  Pulse: 84  Temp: 98.6 F (37 C)  TempSrc: Oral  SpO2: 95%  Weight: 194 lb (88 kg)  Height: _0  (1.702 m)    Estimated body mass index is 30.38 kg/m as calculated from the following:   Height as of this encounter: _1  (1.702 m).   Weight as of this encounter: 194 lb (88 kg).  _2 @  Filed Weights   02/22/22 1504  Weight: 194 lb (88 kg)     Physical Exam    General: No distress. Obese. On o2 Neuro: Alert and Oriented x 3. GCS 15. Speech normal Psych: Pleasant Resp:  Barrel Chest - no.  Wheeze -  Crackles - no, No overt respiratory distress CVS: Normal heart sounds. Murmurs - no Ext: Stigmata of Connective Tissue Disease - no HEENT: Normal upper airway. PEERL +. No post nasal drip        Assessment:       ICD-10-CM   1. ILD (interstitial lung disease) ([place])  J84.9     2. Hypersensitivity pneumonitis ([place])  J67.9     3. Acute bronchitis, unspecified organism  J20.9     4. 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 Tolerating ofev well  Plan  -  continue ofev  - continue o2 3-4LNC at rest and more with exertion - goal pulse ox > 88% - check ECHO next few to several weeks - do simple walk test at time of next visit - check bnp, cbc with diff, bmet , lft  02/22/2022      History of recurrent bronchitis with acute bronchitis 02/22/2022 History of eosinophilia 2018 and 2023 with postiive RAST panel e-asthma , allergic asthma with flare up   11/25/2021 and 02/22/2022 -  -another episode of sinusitis with some wheezing currently. FEatures are c/w allergic asthma. Noted symbicort intolerance  Plan (share decision making) - check cbc with diff - Amoxcillin 537m three times daily  x 5 days (take script for furture use)  - HOld off prednisone at your request - START FASENRA- hold off fasenra application at this moment - can cosider if declining or having recurrent flare ups  Preoperative respiratory exam for knee surgery  -Rehabilitation after knee surgery in the setting of chronic respiratory failure with wheezing can be challenging  Plan - Let see if biologic FBerna Buecan control your wheezing symptoms and acute bronchitis recurrent before we make a decision on knee surgery  Followup  - Kayliana Codd visit - 30 min in 12 weeks but after echo  - symptoms score and walk test  High complex condition requiring intensive therapeutic monitoring.  SIGNATURE    Dr. MBrand Males M.D., F.C.C.P,  Pulmonary and Critical Care Medicine Staff Physician, CConcordDirector - Interstitial Lung Disease  Program  [place] [address] Pager: [phone] If no answer or between  15:00h - 7:00h: call [phone] Telephone: [phone]  3:43 PM 02/22/2022

## 2022-02-22 NOTE — Patient Instructions (Addendum)
ILD (interstitial lung disease) ([place]) due to chronic HP CHronic resp failure with hypoxemia   - PFT wise stable from summer 2022 through SEpt 2023 (though progressive since 2018 and 2021) - unclera why echo was not ordered. Tolerating ofev well  Plan  -  continue ofev  - continue o2 3-4LNC at rest and more with exertion - goal pulse ox > 88% - check ECHO next few to several weeks - do simple walk test at time of next visit - check bnp, cbc with diff, bmet , lft  02/22/2022      History of recurrent bronchitis with acute bronchitis 02/22/2022 History of eosinophilia 2018 and 2023 with postiive RAST panel e-asthma , allergic asthma with flare up   11/25/2021 and 02/22/2022 -  -another episode of sinusitis with some wheezing currently. FEatures are c/w allergic asthma. Noted symbicort intolerance  Plan (share decision making) - check cbc with diff - Amoxcillin 555m three times daily  x 5 days (take script for furture use)  - HOld off prednisone at your request - START FASENRA- hold off fasenra application at this moment - can cosider if declining or having recurrent flare ups  Scattered he has mild -Rehabilitation after knee surgery in the setting of chronic respiratory failure with wheezing can be challenging  Plan - Let see if biologic FBerna Buecan control your wheezing symptoms and acute bronchitis recurrent before we make a decision on knee surgery  Followup  - [name] visit - 30 min in 12 weeks but after echo  - symptoms score and walk test

## 2022-02-23 ENCOUNTER — Ambulatory Visit (HOSPITAL_COMMUNITY): Payer: Medicare Other | Attending: Internal Medicine

## 2022-02-23 DIAGNOSIS — R0600 Dyspnea, unspecified: Secondary | ICD-10-CM | POA: Insufficient documentation

## 2022-02-23 DIAGNOSIS — J849 Interstitial pulmonary disease, unspecified: Secondary | ICD-10-CM | POA: Insufficient documentation

## 2022-02-23 LAB — BASIC METABOLIC PANEL
BUN: 9 mg/dL (ref 6–23)
CO2: 32 mEq/L (ref 19–32)
Calcium: 9.2 mg/dL (ref 8.4–10.5)
Chloride: 101 mEq/L (ref 96–112)
Creatinine, Ser: 0.57 mg/dL (ref 0.40–1.20)
GFR: 84.72 mL/min (ref >60.00)
Glucose, Bld: 85 mg/dL (ref 70–99)
Potassium: 4 mEq/L (ref 3.5–5.1)
Sodium: 139 mEq/L (ref 135–145)

## 2022-02-23 LAB — HEPATIC FUNCTION PANEL
ALT: 9 U/L (ref 0–35)
AST: 14 U/L (ref 0–37)
Albumin: 3.6 g/dL (ref 3.5–5.2)
Alkaline Phosphatase: 72 U/L (ref 39–117)
Bilirubin, Direct: 0 mg/dL (ref 0.0–0.3)
Total Bilirubin: 0.4 mg/dL (ref 0.2–1.2)
Total Protein: 7 g/dL (ref 6.0–8.3)

## 2022-02-23 LAB — CBC WITH DIFFERENTIAL/PLATELET
Basophils Absolute: 0.1 10*3/uL (ref 0.0–0.1)
Basophils Relative: 0.5 % (ref 0.0–3.0)
Eosinophils Absolute: 0.4 10*3/uL (ref 0.0–0.7)
Eosinophils Relative: 4.1 % (ref 0.0–5.0)
HCT: 42.4 % (ref 36.0–46.0)
Hemoglobin: 13.7 g/dL (ref 12.0–15.0)
Lymphocytes Relative: 45.2 % (ref 12.0–46.0)
Lymphs Abs: 4.9 10*3/uL — ABNORMAL HIGH (ref 0.7–4.0)
MCHC: 32.4 g/dL (ref 30.0–36.0)
MCV: 94.2 fl (ref 78.0–100.0)
Monocytes Absolute: 0.9 10*3/uL (ref 0.1–1.0)
Monocytes Relative: 8.3 % (ref 3.0–12.0)
Neutro Abs: 4.5 10*3/uL (ref 1.4–7.7)
Neutrophils Relative %: 41.9 % — ABNORMAL LOW (ref 43.0–77.0)
Platelets: 288 10*3/uL (ref 150.0–400.0)
RBC: 4.5 Mil/uL (ref 3.87–5.11)
RDW: 14.1 % (ref 11.5–15.5)
WBC: 10.8 10*3/uL — ABNORMAL HIGH (ref 4.0–10.5)

## 2022-02-23 LAB — ECHOCARDIOGRAM COMPLETE
Area-P 1/2: 4.08 cm2
MV M vel: 5.4 m/s
MV Peak grad: 116.6 mmHg
MV VTI: 1.8 cm2
Radius: 0.8 cm
S' Lateral: 2.3 cm

## 2022-02-23 LAB — BRAIN NATRIURETIC PEPTIDE: Pro B Natriuretic peptide (BNP): 56 pg/mL (ref 0.0–100.0)

## 2022-03-07 ENCOUNTER — Telehealth: Payer: Self-pay | Admitting: Internal Medicine

## 2022-03-07 MED ORDER — PREDNISONE 10 MG PO TABS: ORAL_TABLET | ORAL | 0 refills | Status: AC

## 2022-03-07 NOTE — Telephone Encounter (Signed)
Called and spoke with patient. She verbalized understanding. She stated that she has not heard anything about the Berna Bue because she wasn't interested in it.   Prednisone has been sent to her pharmacy.   Nothing further needed at time of call.

## 2022-03-07 NOTE — Telephone Encounter (Signed)
Blood eosinophils continue to be high.  What is happening with the Berna Bue application?  This time I think we should try prednisone instead of antibiotic we will do 5-day prednisone as follows.  Please check with her if she is okay with this dose.   Please take prednisone 40 mg x1 day, then 30 mg x1 day, then 20 mg x1 day, then 10 mg x1 day, and then 5 mg x1 day and stop     Latest Reference Range & Units 02/06/07 10:43 04/19/17 16:17 09/28/21 12:19 02/22/22 15:55  Eosinophils Absolute 0.0 - 0.7 K/uL 0.4 0.7 0.5 0.4

## 2022-03-07 NOTE — Telephone Encounter (Signed)
Called and spoke with patient. Patient stated she is congested and she's got a cough and feels rattling in her chest.   Patient stated that she is out of amoxicillin and wants to know if more can be called in for her. Patient also stated that if a steroid needed to be added she would take it.   MR, please advise.

## 2022-04-22 ENCOUNTER — Other Ambulatory Visit: Payer: Self-pay | Admitting: Pharmacist

## 2022-04-22 DIAGNOSIS — Z5181 Encounter for therapeutic drug level monitoring: Secondary | ICD-10-CM

## 2022-04-22 DIAGNOSIS — J849 Interstitial pulmonary disease, unspecified: Secondary | ICD-10-CM

## 2022-04-22 MED ORDER — OFEV 150 MG PO CAPS: 1.0000 | ORAL_CAPSULE | Freq: Two times a day (BID) | ORAL | 2 refills | Status: DC

## 2022-04-22 NOTE — Telephone Encounter (Signed)
Refill sent for OFEV to  [facility]  Dose: 150 mg twice daily  Last OV: 05/17/22 Provider: Dr. Chase Caller  Next OV: 02/22/22  LFTS on 02/22/22 wnl Future order for CMET placed for her to have drawn at next [place], PharmD, MPH, BCPS Clinical Pharmacist (Rheumatology and Pulmonology)

## 2022-04-22 NOTE — Addendum Note (Signed)
Addended by: Cassandria Anger on: 04/22/2022 09:58 AM   Modules accepted: Orders

## 2022-04-30 ENCOUNTER — Telehealth: Payer: Self-pay | Admitting: Pulmonary Disease

## 2022-04-30 MED ORDER — AMOXICILLIN-POT CLAVULANATE 875-125 MG PO TABS: 1.0000 | ORAL_TABLET | Freq: Two times a day (BID) | ORAL | 0 refills | Status: DC

## 2022-04-30 MED ORDER — PREDNISONE 10 MG PO TABS: ORAL_TABLET | ORAL | 0 refills | Status: AC

## 2022-04-30 NOTE — Telephone Encounter (Signed)
[facility] Telephone Encounter  Patient called regarding weekend paging system for week-long history of productive cough, chest congestion, fatigue and multiple sick contacts earlier this month. Hx ILD/chronic HP, chronic hypoxemia on 3-4L O2 at home. Last treated with Augmentin in September with plan to start [facility] however not yet started. No changes in O2 requirement.   Prednisone taper ordered Augmentin x 7days

## 2022-05-17 ENCOUNTER — Other Ambulatory Visit (HOSPITAL_COMMUNITY): Payer: Self-pay

## 2022-05-17 ENCOUNTER — Encounter: Payer: Self-pay | Admitting: Internal Medicine

## 2022-05-17 ENCOUNTER — Ambulatory Visit (INDEPENDENT_AMBULATORY_CARE_PROVIDER_SITE_OTHER): Payer: Medicare Other | Admitting: Internal Medicine

## 2022-05-17 ENCOUNTER — Telehealth: Payer: Self-pay | Admitting: Pharmacist

## 2022-05-17 VITALS — BP 130/76 | HR 86 | Temp 97.9°F | Ht 67.0 in | Wt 193.6 lb

## 2022-05-17 DIAGNOSIS — J8283 Eosinophilic asthma: Secondary | ICD-10-CM | POA: Diagnosis not present

## 2022-05-17 DIAGNOSIS — J4550 Severe persistent asthma, uncomplicated: Secondary | ICD-10-CM

## 2022-05-17 DIAGNOSIS — Z5181 Encounter for therapeutic drug level monitoring: Secondary | ICD-10-CM | POA: Diagnosis not present

## 2022-05-17 DIAGNOSIS — J679 Hypersensitivity pneumonitis due to unspecified organic dust: Secondary | ICD-10-CM | POA: Diagnosis not present

## 2022-05-17 DIAGNOSIS — J849 Interstitial pulmonary disease, unspecified: Secondary | ICD-10-CM | POA: Diagnosis not present

## 2022-05-17 DIAGNOSIS — R0602 Shortness of breath: Secondary | ICD-10-CM

## 2022-05-17 DIAGNOSIS — J209 Acute bronchitis, unspecified: Secondary | ICD-10-CM | POA: Diagnosis not present

## 2022-05-17 DIAGNOSIS — J4541 Moderate persistent asthma with (acute) exacerbation: Secondary | ICD-10-CM | POA: Diagnosis not present

## 2022-05-17 LAB — BRAIN NATRIURETIC PEPTIDE: Pro B Natriuretic peptide (BNP): 37 pg/mL (ref 0.0–100.0)

## 2022-05-17 LAB — COMPREHENSIVE METABOLIC PANEL
ALT: 11 U/L (ref 0–35)
AST: 13 U/L (ref 0–37)
Albumin: 3.8 g/dL (ref 3.5–5.2)
Alkaline Phosphatase: 69 U/L (ref 39–117)
BUN: 9 mg/dL (ref 6–23)
CO2: 35 mEq/L — ABNORMAL HIGH (ref 19–32)
Calcium: 9.3 mg/dL (ref 8.4–10.5)
Chloride: 100 mEq/L (ref 96–112)
Creatinine, Ser: 0.52 mg/dL (ref 0.40–1.20)
GFR: 86.48 mL/min (ref >60.00)
Glucose, Bld: 78 mg/dL (ref 70–99)
Potassium: 4 mEq/L (ref 3.5–5.1)
Sodium: 140 mEq/L (ref 135–145)
Total Bilirubin: 0.3 mg/dL (ref 0.2–1.2)
Total Protein: 6.9 g/dL (ref 6.0–8.3)

## 2022-05-17 LAB — SEDIMENTATION RATE: Sed Rate: 21 mm/hr (ref 0–30)

## 2022-05-17 LAB — HEPATIC FUNCTION PANEL
ALT: 11 U/L (ref 0–35)
AST: 13 U/L (ref 0–37)
Albumin: 3.8 g/dL (ref 3.5–5.2)
Alkaline Phosphatase: 69 U/L (ref 39–117)
Bilirubin, Direct: 0.1 mg/dL (ref 0.0–0.3)
Total Bilirubin: 0.3 mg/dL (ref 0.2–1.2)
Total Protein: 6.9 g/dL (ref 6.0–8.3)

## 2022-05-17 LAB — CBC WITH DIFFERENTIAL/PLATELET
Basophils Absolute: 0.1 10*3/uL (ref 0.0–0.1)
Basophils Relative: 0.7 % (ref 0.0–3.0)
Eosinophils Absolute: 0.5 10*3/uL (ref 0.0–0.7)
Eosinophils Relative: 5 % (ref 0.0–5.0)
HCT: 41 % (ref 36.0–46.0)
Hemoglobin: 13.6 g/dL (ref 12.0–15.0)
Lymphocytes Relative: 36.7 % (ref 12.0–46.0)
Lymphs Abs: 3.8 10*3/uL (ref 0.7–4.0)
MCHC: 33.3 g/dL (ref 30.0–36.0)
MCV: 91.7 fl (ref 78.0–100.0)
Monocytes Absolute: 1.2 10*3/uL — ABNORMAL HIGH (ref 0.1–1.0)
Monocytes Relative: 11.4 % (ref 3.0–12.0)
Neutro Abs: 4.8 10*3/uL (ref 1.4–7.7)
Neutrophils Relative %: 46.2 % (ref 43.0–77.0)
Platelets: 289 10*3/uL (ref 150.0–400.0)
RBC: 4.47 Mil/uL (ref 3.87–5.11)
RDW: 14 % (ref 11.5–15.5)
WBC: 10.3 10*3/uL (ref 4.0–10.5)

## 2022-05-17 LAB — NITRIC OXIDE: Nitric Oxide: 25

## 2022-05-17 MED ORDER — PREDNISONE 10 MG PO TABS: ORAL_TABLET | ORAL | 0 refills | Status: AC

## 2022-05-17 MED ORDER — AMOXICILLIN 500 MG PO CAPS: 500.0000 mg | ORAL_CAPSULE | Freq: Three times a day (TID) | ORAL | 0 refills | Status: DC

## 2022-05-17 NOTE — Telephone Encounter (Signed)
Received notification that after today's OV with Dr. Chase Caller, patient is now interested in starting [place].  Copay for 28 day supply is $262.89  She previously did not want to apply for [employer]ME. She has Medicare A/B and BCBS supplement. She may be a good candidate to receive in-office injection as supplemental plan would cover Berna Bue  Will need to contact patient if interested in AZ&ME PAP for Fasenra self-admin pt assistance.  Knox Saliva, PharmD, MPH, BCPS, CPP Clinical Pharmacist (Rheumatology and Pulmonology)

## 2022-05-17 NOTE — Telephone Encounter (Signed)
Pt had a visit today with MR and pt said that she was interested in beginning [place].

## 2022-05-17 NOTE — Progress Notes (Signed)
Blood EOS Continue to be high. Definitely needs [facility]

## 2022-05-17 NOTE — Patient Instructions (Addendum)
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 CHronic resp failure with hypoxemia   - PFT wise stable from summer 2022 through SEpt 2023 (though progressive since 2018 and 2021) - echo sept 2023 suggests pulmonary hypertension  Plan  -  continue ofev  - continue o2 3-4LNC at rest and more with exertion - goal pulse ox > 88% - lab work as below  - like to discuss issue of cocnern for pulmonary hypertension at next visit    History of recurrent bronchitis with acute bronchitis /asthma flare up - 02/22/2022, Oct 2023, Nov 2023, Dec 2023 History of eosinophilia 2018 and 2023 with postiive RAST panel e-asthma , allergic asthma with flare up   11/25/2021 and 02/22/2022 and Nov 2023 and 05/17/2022\   -another episode of sinusitis with some wheezing currently. FEatures are c/w allergic asthma. Repeated acute bronchitis flare up   Plan (share decision making) - check cbc with diff, bnp, LFT, ESR 05/17/2022 - check feno 05/17/2022 - Amoxcillin 500mg three times daily  x 10 days (take script for furture use)  - Please take prednisone 40 mg x1 day, then 30 mg x1 day, then 20 mg x1 day, then 10 mg x1 day, and then 5 mg x1 day and stop  - you can start anytime next 1-2 weeks depending on how you feel - START  fasenra due to recurrent flare up  - counseled extensively - do spirometry and dlco in 3-4 months   Followup  - RAmaswamy visit - 30 min in 12-16  weeks   - symptoms score and walk test at followup 

## 2022-05-17 NOTE — Progress Notes (Signed)
xxxxxxxxxxxxxxxxxxxxx  OV 07/29/2019 -transfer of care to Dr. Chase Caller interstitial lung disease center.  Subjective:  Patient ID: [name], female , DOB: 05-May-1941 , age 81 y.o. , MRN: [id number] , ADDRESS: [place] [address]   07/29/2019 -   Chief Complaint  Patient presents with   Follow-up    Pt states her breathing has become worse since last visit. States she also has an occ cough.   #Interstitial lung disease (progressive) with chronic hypoxemic respiratory failure - chronic HP dx -Per [facility] September 2016: Disease present at least since 07-30-06  - Surgical lung biopsy  November 20, 2014 at [place]:   - local opinion: diagnostic of usual interstitial pneumonia (UIP). Interestingly, there are scattered non-necrotizing granulomata in the pleura.  -Second opinion September 2016 at [facility]: pathology was reviewed at our multidisciplinary ILD conference and noted to have a variegated pattern of fibrosis with some airway centricity, granulomas noted both near the airway and more peripherally. Minimal inflammation was noted. Features not c/w with UIP. Few giant cells with cholesterol clefts. Overall pattern most in keeping with chronic HP  -High-resolution CT chest including April 09, 2018: Lack of craniocaudal gradient and significant air trapping suggestive of chronic hypersensitivity pneumonitis [September Jul 30, 2014 [facility] evaluation thought CT was inconsistent with UIP]  -Clinical diagnosis of chronic hypersensitivity pneumonitis   - dx given Sept 2016 at [place]  -No clear-cut exposure identified at [facility] evaluation September 2016 by Dr. Dorothyann Peng  -Steroids recommended but patient preferred not to take it September 2016 at [place]   - deferred steroids sept 07-30-2014 visit at [facility] with Dr Sherri Sear  - nintedanib since March 2020   #Small hiatal hernia with acid reflux disease  HPI [name] 81 y.o. -is a  transfer of care from Dr. Lake Bells to Dr. Chase Caller with interstitial lung disease questionnaire.  She was last seen by Dr. Lake Bells in March 2020.  After the onset of the COVID-19 pandemic Dr. Lake Bells is now fully in charge of the Covid hospital [facility].  Therefore he is not in the outpatient medical practice anymore.  Therefore patient has been transferred to the ILD center to Dr. Chase Caller myself.  Is the first time I am meeting her and getting to know her.  She tells me that I took care of her husband for COPD a few years ago.  This was her ex-husband.  He passed away in 31-Jul-2011 after hospitalization to the ICU under our service.  She is grateful for the care.  She tells me that she has interstitial lung disease and for the last year she has been on nintedanib.  Currently she is having difficulty with nintedanib supply.  She only has 2 weeks left.  She uses 3 L of oxygen at rest.  She says she has been doing that for the last 4 years.  It is stable usage.  I am not really sure what her pulse ox on room air would be.  She says occasionally she would crank it up to 4 L for exertion but she generally uses 3 L both at rest and with exertion.  She says she tolerates nintedanib fine. Her interstitial lung disease history was reviewed and summarized above.  It appears that she was not keen on taking prednisone and her 07/30/14 visit with Dr. Dorothyann Peng at [facility].  Therefore once the [facility] data was available she was started on nintedanib a  year ago.She wants to have a son involved in her visits.  Because of the COVID-19 pandemic he is waiting outside in the car.  She says at the next visit she wants to bring him in.   Her symptom score is listed below.  Her progressive lung function decline is also listed below and the pulmonary function test reviewed.  I personally reviewed and visualized and interpreted those.  Her DLCO is paradoxically higher.  I think this is an error.   ROS - per  HPI  OV 10/03/2019  Subjective:  Patient ID: [name], female , DOB: Jan 18, 1941 , age 81 y.o. 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 Interestingly, there are scattered non-necrotizing granulomata in the pleura.  -Second opinion September 2016 at [facility]: pathology was reviewed at our multidisciplinary ILD conference and noted to have a variegated pattern of fibrosis with some airway centricity, granulomas noted both near the airway and more peripherally. Minimal inflammation was noted. Features not c/w with UIP. Few giant cells with cholesterol clefts. Overall pattern most in keeping with chronic HP  -High-resolution CT chest including April 09, 2018: Lack of craniocaudal gradient and significant air trapping suggestive of chronic hypersensitivity pneumonitis [September 2016 [facility] evaluation thought CT was inconsistent with UIP]  -Clinical diagnosis of chronic hypersensitivity pneumonitis   - dx given Sept 2016 at [place]  -No clear-cut exposure identified at [facility] evaluation September 2016 by Dr. Dorothyann Peng  -Steroids recommended but patient preferred not to take it September 2016 at [place]   - deferred steroids sept 2016 visit at [facility] with Dr Sherri Sear  - nintedanib since March 2020   #Small hiatal hernia with acid reflux disease  10/03/2019 -   Chief Complaint  Patient presents with   Follow-up    SOB and cough with activity has increasd, Productive cough with yellow sputum     HPI [name] 81 y.o. -returns for follow-up with her son.  At this point in time the focus is to establish the true nature of interstitial  lung disease.  She has brought her son with her.  However approximately 2 weeks ago she had acute bronchitis symptoms and called in.  Was given a cephalexin 5 days and prednisone 5 days.  These have helped immensely.  However she feels not back to baseline.  She feels she will benefit from another round of antibiotic with or without prednisone.  She finished ILD questionnaire and it is listed below.  We have not been able to discuss in a multidisciplinary case conference of 4. [place] Integrated Comprehensive ILD Questionnaire  Symptoms: See below.    SYMPTOM SCALE - ILD 07/29/2019  10/03/2019   O2 use 3L at rest, occ 4L with exertion 3L at rest and 4L with exertion. Acute bronchitis 2 weeks ago  Shortness of Breath 0 -> 5 scale with 5 being worst (score 6 If unable to do)   At rest 0 1  Simple tasks - showers, clothes change, eating, shaving 1 2  Household (dishes, doing bed, laundry) 2 3  Shopping 2 2  Walking level at own pace 1 2  Walking up Stairs 6 4  Total (  30-36) Dyspnea Score 12 14  How bad is your cough? 1 3.5  How bad is your fatigue 0 3.5  How bad is nausea 0 0  How bad is vomiting?  0 0  How bad is diarrhea? 0 0  How bad is anxiety? 1 1.5  How bad is depression 1 1     Past Medical History :  -Denies any collagen vascular disease or vasculitis.  Denies any knowledge of prior pulmonary hypertension.  Denies diabetes or thyroid disease.  Denies any stroke.  Denies mononucleosis denies tuberculosis denies pneumonia denies blood clots denies heart disease denies pleurisy   ROS: Positive for shoulder pain for the last several years but otherwise no dry eyes.  No Raynaud's.  No weight loss.  No nausea no vomiting no rash   FAMILY HISTORY of LUNG DISEASE: Denies any pulmonary fibrosis or COPD or cystic fibrosis of hypersensitive pneumonitis or any other lung disease.   EXPOSURE HISTORY: Never smoked cigarettes.  No passive smoking.  No marijuana.  No vaping no cocaine no  intravenous drug use.   HOME and HOBBY DETAILS : Current home is 25 years she is lived there for 5 years.  In [place] there is no dampness.  No mold or mildew no feather pillow.  No steam iron use no Jacuzzi use no nebulizer use no humidifier use.  No pet birds or parakeets.  No pet gerbils no pet hamsters no pet rabbits no rodents.  No mold in the [facility] duct no music habits such as wind instruments no gardening.  No flood of water damage.  No strong mats.  No exposure to animals at work.  History update in April 2023: She reported that between 2010 2014 she lived in the mother's house that was built in the 1950s.  She thought there was mold in this but never confirmed.  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  She also had a long-term tendency to clean her home heavily with Clorox.  She inhaled these fumes according to the son.  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   PULMONARY TOXICITY HISTORY (27 items): She has done prednisone burst.  She does not want to do long-term prednisone.  She has been on long-term nintedanib but tolerating it well.    Testing -2018 echo with elevated pulmonary systolic pressure 55 mmHg  - nov 2019 Last CT - HRCT IMPRESSION: 1. Spectrum of findings compatible with fibrotic interstitial lung disease with extensive air trapping and absence of basilar gradient, strongly favoring chronic hypersensitivity pneumonitis. Findings have slightly progressed since 04/24/2017, with more clear progression since more remote chest CT of 06/24/2014. Findings are suggestive of an alternative diagnosis (not UIP) per consensus guidelines: Diagnosis of Idiopathic Pulmonary Fibrosis: An  Official ATS/ERS/JRS/ALAT Clinical Practice Guideline. [place], Iss 5, [phone], Feb 04 2017. 2. Stable mild mediastinal lymphadenopathy, most compatible with benign reactive adenopathy. 3. One vessel coronary atherosclerosis. 4. Small hiatal hernia.   Aortic Atherosclerosis (ICD10-I70.0).     Results for MEHAR, SAGEN (MRN [id number]) as of 07/29/2019 11:43  Ref. Range 07/04/2014 12:58 09/03/2015 11:04 09/27/2016 13:56 05/01/2017 14:05 07/19/2019 15:54  FVC-Pre Latest Units: L 2.13 1.93 1.86 1.71 1.61  FVC-%Pred-Pre Latest Units: % 66 61 59 55 53   Results for SHALONA, HARBOUR (MRN [id number]) as of 07/29/2019 11:43  Ref. Range 07/04/2014 12:58 09/03/2015 11:04 09/27/2016 13:56 05/01/2017 14:05 07/19/2019 15:54  DLCO unc Latest Units: ml/min/mmHg 13.70 14.70 12.69 11.23 17.11  DLCO unc % pred Latest Units: % 48 51 44 39 82     ROS  OV 12/03/2019  Subjective:  Patient ID: [name], female , DOB: Oct 24, 1940 , age 73 y.o. , MRN: [id number] , ADDRESS: [place] [address]   12/03/2019 -   Chief Complaint  Patient presents with   Follow-up    cough is better but still present with exertion, breathing about the same since last visit    #Interstitial lung disease (progressive) with chronic hypoxemic respiratory failure - chronic HP dx -Per [facility] September 2016: Disease present at least since 2008  - Surgical lung biopsy  November 20, 2014 at [place]:   - local opinion: diagnostic of usual interstitial pneumonia (UIP). Interestingly, there are scattered non-necrotizing granulomata in the pleura.  -Second opinion September 2016 at [facility]: pathology was reviewed at our multidisciplinary ILD conference and noted to have a variegated pattern of fibrosis with some airway centricity, granulomas noted both near the airway and more peripherally. Minimal inflammation was noted. Features not c/w with UIP. Few giant  cells with cholesterol clefts. Overall pattern most in keeping with chronic HP   - 3rd opinon: June 2021 [facility] IL[facility] [place] - HP   -High-resolution CT chest including April 09, 2018: Lack of craniocaudal gradient and significant air trapping suggestive of chronic hypersensitivity pneumonitis [September 2016 [facility] evaluation thought CT was inconsistent with UIP]  -Clinical diagnosis of chronic hypersensitivity pneumonitis   - dx given Sept 2016 at [place]  -No clear-cut exposure identified at [facility] evaluation September 2016 by Dr. Dorothyann Peng  -Steroids recommended but patient preferred not to take it September 2016 at [place]   - deferred steroids sept 2016 visit at [facility] with Dr Sherri Sear  - nintedanib since March 2020   #Small hiatal hernia with acid reflux disease   HPI [name] 81 y.o. -presents with her son for follow-up.  She continues to be on 3 L at rest 4 L with exertion.  Her acute bronchitis from April 2021 is resolved.  Her overall symptoms are stable/slightly better after her acute bronchitis.  There is no interim complaints.  In the interim we did discuss her at her case conference.  The consensus diagnosis is that she has hypersensitive pneumonitis.  The only exposure is when she was working in the [place] at in [place].  She and her son again denies any ongoing exposures.  Reviewed her pulmonary function test and it is stable compared to the recent ones.  There is progression over the last few years to several years.  In terms of a high-resolution CT chest is also stable since 2019 through 2021 but progressive when compared to earlier time points.  She was started on nintedanib in March 2020.  She is tolerating this quite well.  It appears that she and she went on nintedanib she is stable.  She yet to do pulmonary rehabilitation.  Last done few years ago.  Son feels that she needs to be more mobile She is yet to connect with the support group She did not get  referred to cardiology for right heart  catheterization. She wants a flutter valve for her associated bronchiectasis seen on CT scan    OV 03/09/2020   Subjective:  Patient ID: [name], female , DOB: 03/07/1941, age 24 y.o. years. , MRN: [id number],  ADDRESS: [place] [address] PCP  Lawerance Cruel, MD Providers : Treatment Team:  Attending Provider: Brand Males, MD   Chief Complaint  Patient presents with   Follow-up    Patient wears 4 liters oxygen with exertion, was prescribed celebrex they want to know if it is ok with OFEV.      #Interstitial lung disease (progressive) with chronic hypoxemic respiratory failure - chronic HP dx -Per [facility] September 2016: Disease present at least since 2008  - Surgical lung biopsy  November 20, 2014 at [place]:   - local opinion: diagnostic of usual interstitial pneumonia (UIP). Interestingly, there are scattered non-necrotizing granulomata in the pleura.  -Second opinion September 2016 at [facility]: pathology was reviewed at our multidisciplinary ILD conference and noted to have a variegated pattern of fibrosis with some airway centricity, granulomas noted both near the airway and more peripherally. Minimal inflammation was noted. Features not c/w with UIP. Few giant cells with cholesterol clefts. Overall pattern most in keeping with chronic HP   - 3rd opinon: June 2021 [facility] [place] - Hypersensitivity Pneumonitis   -High-resolution CT chest including April 09, 2018: Lack of craniocaudal gradient and significant air trapping suggestive of chronic hypersensitivity pneumonitis [September 2016 [facility] evaluation thought CT was inconsistent with UIP]  -Clinical diagnosis of chronic hypersensitivity pneumonitis   - dx given Sept 2016 at [place]  -No clear-cut exposure identified at [facility] evaluation September 2016 by Dr. Dorothyann Peng  -Steroids recommended but patient preferred not to  take it September 2016 at [place]   - deferred steroids sept 2016 visit at [facility] with Dr Dorothyann Peng and again at [facility] in June 2021  - nintedanib sole Rx since March 2020     HPI [name] 81 y.o. -returns for follow-up with her son Simona Huh.  Since her last visit in June 2020 when she continues to be stable.  She is enrolled in the pulmonary rehabilitation program which has helped her fatigue and shortness of breath significantly.  She continues on nintedanib without any adverse side effects.  She wanted to know if it is okay to take cephalexin for arthralgia.  She has chronic osteoarthritis.  She wants to avoid NSAIDs.  In terms of oxygen use she is stable at 4 L nasal cannula.  She recently had pulmonary function test I reviewed this and it shows stability and is documented below.  Her weight continues to be stable.  We referred her for right heart catheterization with because of the Covid delta surge she deferred it.  Of note on February 28, 2020 while trying to get into the car out of her primary care physician's office she fell down and sustained abrasions and bruises injuries particularly in her lower extremity.  Since then her right kneecap patella area is red and warm.  There is a small incision injury there but this is healed.  She thinks it slightly better/stable.  She is not having any fever.  This has not been treated.      ROS - per HPI  CT chest high resolution - may 2021  IMPRESSION: 1. Spectrum of findings compatible with fibrotic interstitial lung disease with moderate air trapping and absence of apicobasilar gradient. Findings  are most compatible with chronic hypersensitivity pneumonitis. No interval progression since 2019 chest CT. Evidence of progression since baseline 2016 chest CT. Findings are suggestive of an alternative diagnosis (not UIP) per consensus guidelines: Diagnosis of Idiopathic Pulmonary Fibrosis: An Official ATS/ERS/JRS/ALAT  Clinical Practice Guideline. [place], Iss 5, [phone], Feb 04 2017. 2. Stable mild mediastinal lymphadenopathy, compatible with benign reactive adenopathy. 3. Small hiatal hernia. 4. Aortic Atherosclerosis (ICD10-I70.0).     Electronically Signed   By: Ilona Sorrel Smith.D.   On: 10/24/2019 15:02    OV 09/24/2020  Subjective:  Patient ID: [name], female , DOB: 06/28/1940 , age 38 y.o. , MRN: [id number] , ADDRESS: [place] [address] PCP Lawerance Cruel, MD Patient Care Team: Lawerance Cruel, MD as PCP - General (Family Medicine) Tamsen Roers, MD as Referring Physician (Family Medicine) Melrose Nakayama, MD as Consulting Physician (Cardiothoracic Surgery)  This Provider for this visit: Treatment Team:  Attending Provider: Brand Males, MD    09/24/2020 -   Chief Complaint  Patient presents with   Follow-up    Review PFT results. Still reports cough, sometimes productive with clear phlegm.      #Interstitial lung disease (progressive) with chronic hypoxemic respiratory failure - chronic HP dx -Per [facility] September 2016: Disease present at least since 2008  - Surgical lung biopsy  November 20, 2014 at [place]:   - local opinion: diagnostic of usual interstitial pneumonia (UIP). Interestingly, there are scattered non-necrotizing granulomata in the pleura.  -Second opinion September 2016 at [facility]: pathology was reviewed at our multidisciplinary ILD conference and noted to have a variegated pattern of fibrosis with some airway centricity, granulomas noted both near the airway and more peripherally. Minimal inflammation was noted. Features not c/w with UIP. Few giant cells with cholesterol clefts. Overall pattern most in keeping with chronic HP   - 3rd opinon: June 2021 [facility] [place] - Hypersensitivity Pneumonitis   -High-resolution CT chest including April 09, 2018: Lack of  craniocaudal gradient and significant air trapping suggestive of chronic hypersensitivity pneumonitis [September 2016 [facility] evaluation thought CT was inconsistent with UIP]  - May 2021: stability since 2019 but progression sice 2016  -Clinical diagnosis of chronic hypersensitivity pneumonitis   - dx given Sept 2016 at [place]  -No clear-cut exposure identified at [facility] evaluation September 2016 by Dr. Dorothyann Peng  -Steroids recommended but patient preferred not to take it September 2016 at [place]   - deferred steroids sept 2016 visit at [facility] with Dr Dorothyann Peng and again at [facility] in June 2021, Apr 2022  - nintedanib sole Rx since March 2020       HPI [name] 81 y.o. -presents for follow-up.  Last seen in the fall 2021.  After that overall she is been stable but last month she picked up a respiratory infection got antibiotic and prednisone and then she is better.  Nevertheless she thinks her symptoms may be a little worse than baseline.  Still she says she gets by with 3-4 L of oxygen which is her baseline.  Subjective symptom score overall has not changed.  She had pulmonary function test today and this shows a decline it is all documented below.  I visualized this.  Her last CT scan of the chest was in May 2021.  1 year anniversary of the CT scan is coming up.  Over the winter she declined  to have right heart catheterization because of COVID-19.  But at this point in time with the improvement in the pandemic prevalence rate in the local community of COVID she is willing to have a right heart catheterization.  She is tolerating nintedanib just fine without any problems.  A walking desaturation test on room air is as below      OV 10/27/2020  Subjective:  Patient ID: [name], female , DOB: Feb 03, 1941 , age 22 y.o. , MRN: [id number] , ADDRESS: [place] [id number] PCP Lawerance Cruel, MD Patient Care Team: Lawerance Cruel,  MD as PCP - General (Family Medicine) Larey Dresser, MD as PCP - Advanced Heart Failure (Cardiology) Tamsen Roers, MD as Referring Physician (Family Medicine) Melrose Nakayama, MD as Consulting Physician (Cardiothoracic Surgery)  This Provider for this visit: Treatment Team:  Attending Provider: Brand Males, MD   #Interstitial lung disease (progressive) with chronic hypoxemic respiratory failure - chronic HP dx -Per [facility] September 2016: Disease present at least since 2008  - Surgical lung biopsy  November 20, 2014 at :   - local opinion: diagnostic of usual interstitial pneumonia (UIP). Interestingly, there are scattered non-necrotizing granulomata in the pleura.  -Second opinion September 2016 at [facility]: pathology was reviewed at our multidisciplinary ILD conference and noted to have a variegated pattern of fibrosis with some airway centricity, granulomas noted both near the airway and more peripherally. Minimal inflammation was noted. Features not c/w with UIP. Few giant cells with cholesterol clefts. Overall pattern most in keeping with chronic HP   - 3rd opinon: June 2021 [facility] [place] - Hypersensitivity Pneumonitis   -High-resolution CT chest including April 09, 2018: Lack of craniocaudal gradient and significant air trapping suggestive of chronic hypersensitivity pneumonitis [September 2016 [facility] evaluation thought CT was inconsistent with UIP]  - May 2021: stability since 2019 but progression sice 2016  -Clinical diagnosis of chronic hypersensitivity pneumonitis   - dx given Sept 2016 at [place]  -No clear-cut exposure identified at [facility] evaluation September 2016 by Dr. Dorothyann Peng  -Steroids recommended but patient preferred not to take it September 2016 at [place]   - deferred steroids sept 2016 visit at [facility] with Dr Dorothyann Peng and again at [facility] in June 2021, Apr 2022  - nintedanib sole Rx since March  2020    10/27/2020 -  TELEPHONE CALL  Type of visit: Telephone/Video Circumstance: COVID-19 national emergency Identification of patient Joy MCHANEY with 18-Jun-1940 and MRN [id number] - 2 person identifier Risks: Risks, benefits, limitations of telephone visit explained. Patient understood and verbalized agreement to proceed Anyone else on call:  Patient location: [phone] This provider location: [address], [facility], [place], [place]   HPI Rukiya AYLIN RHOADS 81 y.o. -this telephone visit is for a few reasons  1)  grandson getting married in July in [place] and [facility] is elevated. Wants to go. Son Denies thiniong of taking her to [facility]. It is 4000 feet + /1300 meters. Advised them [place] is safeer from elevation  2) discussed heart catheterization results which are essentially normal.  3) discussed high-resolution CT chest because of concern of progression but in the last 1 year her ILD is stable on nintedanib.  She is happy about this  4) discuss future therapeutic options: At this point in time she is maxed out on therapy.  Not interested in prednisone.  She is  on antifibrotic nintedanib.  We discussed the pulse inhaled nitric oxide study phase 3.  She is very interested in this.  We discussed about the device and also convenience and buying her oxygen backpack called [employer].  I believe she has a copy of the consent for the study.  We discussed the principles of this.  She is very interested.  She wants to hear from the research coordinator.  I have sent the research coordinator Lazaro Arms message.  Advised may be a few weeks.   Heart Cath 10/14/20  1. Minimally elevated PA pressure with PVR only 2.1.  2. Normal filling pressures.  3. No significant coronary disease.  R[facility] Procedural Findings: Hemodynamics (mmHg) RA mean 4 RV 35/3 PA 36/8, mean 18 PCWP mean 6 LV 165/12 AO 161/69  Oxygen saturations: PA 76% AO 100%  Cardiac Output  (Fick) 5.73  Cardiac Index (Fick) 2.69  PVR 2.1    RESEARCH VISI 08/25/2021     Title:  A Randomized, double-blind, placebo-controlled dose escalation and verification clinical study, to assess the safety and efficacy of pulsed, inhaled nitric oxide (iNO) in subjects at risk of pulmonary hypertension associated with pulmonary fibrosis on long term oxygen therapy (Part 1 and Part 2). Primary Endpoint: The placebo-corrected change for INOpulse in minutes of moderate to vigorous physical activity (MVPA) measured by actigraphy from baseline to month 4.  Duration of treatment: Study participants will receive iNO 45 mcg/kg IBW/hr versus placebo for 4 months (16 weeks) during Part 1. Then in Part 2: Open Label Extension (OLE) Study participants will be offered open label therapy at iNO 45 mcg/kg IBW/hr after completing the Part 1.  Protocol #: PULSE-PHPF-001 (REBUILD), ClinicalTrials.gov Identifier: [id number], **Sponsor is [employer], [place], [place] [address])     Xxxx  This visit for Subject GRATIA DISLA with DOB: July 25, 1940 on 08/25/2021 for the above protocol is Visit/Encounter # OLE  and is for purpose of wihtdrawing frfom study . Subject/LAR expressed continued interest and consent in continuing as a study subject. Subject thanked for participation in research and contribution to science.   S: In the study visit.  She presents with her son.  She tells me that she is done with the study.  She does not want to continue with the study anymore.  Recently she has been noncompliant with using the inhaled nitric oxide device.  I spoke to her and she said after that she was completely compliant.  However this was giving an awful taste [dysgeusia].  She did not feel any benefit.  So she stopped it 24 hours ago.  She is only using it at night.  She also wants to withdraw from the study.  Per the protocol open label patients who stop using the study drug do not need to actively  follow-up but will only be followed up for collection of vital status.  I tried to encourage her to continue to participate in the study.  Did indicate to her that her pulmonary function test had stabilized but she is not interested at all.  Other issues - Interstitial lung disease: She feels it is overall stable.  3 L at rest 4 L with exertion.  Pulmonary function test most recently the [facility] is down but it seemed to improve while she was inhaled nitric oxide.  The DLCO is also stable.  She is tolerating nintedanib well without any problems.  She continues to be able to do her groceries with walking with a walker.  She  did have a high-resolution CT chest in January 2023.  Radiologist has not commented on any progression.  I written to the radiologist  #Weight loss: She believes she is lost a lot of weight.  The weight loss profile as below.  She says this is intentional.  I did indicate to her that nintedanib can make people lose weight but she believes it is all intentional.  She feels she is in a healthy position right now.  Therefore she wants to still continue nintedanib  #History of fall.:  She fell on 1 month ago.  She just had a mild bruise to her right knee but did not need any medication.  No injury.  It was a trip and fall when oxygen was delivered.  #Recurrent bronchitis/cough: Saw nurse practitioner acutely 05/26/2021 with productive cough increased mucus buildup that was green and yellow.  She was given prednisone taper and Augmentin.  Chest x-ray at that time showed stable ILD.  She followed up with nurse practitioner 06/14/2021 reporting while on antibiotic and prednisone she was better but then she started deteriorating again but this time did not have any fever but did have yellow and white secretions.  Patient was given a prednisone taper and a CT chest was ordered.  The high-resolution CT chest shows ILD but radiologist has not commented on progression.  She then followed up with nurse  practitioner 06/28/2021.  She was using 3% saline.  After that she is following with me at this visit.  She is saying she continues to have cough.  She feels she needs another course of 8-day amoxicillin.  She is very specific about the amount of oxacillin she needs.  She does not like prednisone but is open to taking a short course of prednisone.  We agreed on a 5-day taper.    OV 09/28/2021  Subjective:  Patient ID: [name], female , DOB: Feb 02, 1941 , age 61 y.o. , MRN: [id number] , ADDRESS: [place] [address] PCP Lawerance Cruel, MD Patient Care Team: Lawerance Cruel, MD as PCP - General (Family Medicine) Larey Dresser, MD as PCP - Advanced Heart Failure (Cardiology) Tamsen Roers, MD as Referring Physician (Family Medicine) Melrose Nakayama, MD as Consulting Physician (Cardiothoracic Surgery)  This Provider for this visit: Treatment Team:  Attending Provider: Brand Males, MD    09/28/2021 -   Chief Complaint  Patient presents with   Follow-up    PFT performed with research 08/25/21. Pt states that she has had a cough as well as some congestion. Also has some wheezing.     #Interstitial lung disease (progressive) with chronic hypoxemic respiratory failure - chronic HP dx -Per [facility] September 2016: Disease present at least since 2008  - Surgical lung biopsy  November 20, 2014 at [place]:   - local opinion: diagnostic of usual interstitial pneumonia (UIP). Interestingly, there are scattered non-necrotizing granulomata in the pleura.  -Second opinion September 2016 at [facility]: pathology was reviewed at our multidisciplinary ILD conference and noted to have a variegated pattern of fibrosis with some airway centricity, granulomas noted both near the airway and more peripherally. Minimal inflammation was noted. Features not c/w with UIP. Few giant cells with cholesterol clefts. Overall pattern most in keeping with  chronic HP   - 3rd opinon: June 2021 [facility] ILD [place] - Hypersensitivity Pneumonitis   -High-resolution CT chest including April 09, 2018: Lack of craniocaudal gradient and significant air trapping suggestive of chronic  hypersensitivity pneumonitis [September 2016 [facility] evaluation thought CT was inconsistent with UIP]  - May 2021: stability since 2019 but progression sice 2016  -Clinical diagnosis of chronic hypersensitivity pneumonitis   - dx given Sept 2016 at [place]  -No clear-cut exposure identified at [facility] evaluation September 2016 by Dr. Dorothyann Peng  -Steroids recommended but patient preferred not to take it September 2016 at [facility]   -Normal right heart catheterization May 2022  -Treatment   - deferred steroids sept 2016 visit at [facility] with Dr Dorothyann Peng and again at [facility] in June 2021, Apr 2022  - nintedanib sole Rx since March 2020  -Inhaled nitric oxide versus placebo research protocol: Withdrawal consent and follow-up March 2023  HPI Saralee MEILY GLOWACKI 81 y.o. -here for follow-up.  Presents with son Simona Huh she is now off the inhaled nitric oxide protocol.  She continues to take nintedanib.  Overall she feels stable.  She continues on 3-4 L of oxygen.  Nevertheless she continues to complain about recurrent sinus or bronchitis issues.  Review of the labs indicate that she has had eosinophilia in 2018.  No blood allergy work-up although mold hypersensitive pneumonitis panel has been negative.  No prior IgE evaluation.  She says that she got an infection in December 2023 and since then she is having recurrent sinusitis or bronchitis.  Currently with the pollen levels she feels she is having a sinusitis.  In March 2023 I gave her amoxicillin and prednisone.  She wants the same again.  In mid May 2023 she is going to visit her uncle and also go for a wedding in [place] in the [place] area and she will be back in early June 2023.  Her son Simona Huh is going to take  her.  She wants to make sure she gets an antibiotic and prednisone before the trip as well because she wants to feel good for the trip.  Her most recent pulmonary function test was in March 2023.  Her symptom score is detailed below recently stable but over time it is worse.        HRCT Jan 2023  Narrative & Impression  CLINICAL DATA:  Interstitial lung disease.   EXAM: CT CHEST WITHOUT CONTRAST   TECHNIQUE: Multidetector CT imaging of the chest was performed following the standard protocol without intravenous contrast. High resolution imaging of the lungs, as well as inspiratory and expiratory imaging, was performed.   RADIATION DOSE REDUCTION: This exam was performed according to the departmental dose-optimization program which includes automated exposure control, adjustment of the mA and/or kV according to patient size and/or use of iterative reconstruction technique.   COMPARISON:  09/29/2020, 10/24/2019 and 04/09/2018.   FINDINGS: Cardiovascular: Atherosclerotic calcification of the aorta, aortic valve and coronary arteries. Pulmonic trunk and heart are enlarged. No pericardial effusion.   Mediastinum/Nodes: Low-attenuation right thyroid nodule measures approximately 2.0 cm. Streak artifact from a right shoulder arthroplasty degrades image quality in this area. No pathologically enlarged mediastinal or axillary lymph nodes. Hilar regions are difficult to evaluate without IV contrast. Esophagus is grossly unremarkable.   Lungs/Pleura: Traction bronchiectasis/bronchiolectasis, architectural distortion, subpleural reticular densities and ground-glass, similar to prior exams. No definite zonal predominance. Biopsy suture line in the inferior aspect of the anterior segment right upper lobe. No pleural fluid. Airway is unremarkable. There is air trapping.   Upper Abdomen: Visualized portion of the liver is unremarkable. Stones in the gallbladder. Visualized portions of  the adrenal glands, kidneys, spleen, pancreas, stomach and  bowel are unremarkable with the exception of a small hiatal hernia. No upper abdominal adenopathy.   Musculoskeletal: Degenerative changes in the spine. Right shoulder arthroplasty. No worrisome lytic or sclerotic lesions.   IMPRESSION: 1. 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 Findings are suggestive of an alternative diagnosis (not UIP) per consensus guidelines: Diagnosis of Idiopathic Pulmonary Fibrosis: An Official ATS/ERS/JRS/ALAT Clinical Practice Guideline. [place], Iss 5, [phone], Feb 04 2017. 2. Cholelithiasis. 3. 2.0 cm low-attenuation right thyroid nodule. Recommend thyroid ultrasound. (Ref: J Am Coll Radiol. 2015 Feb;12(2): 143-50). 4. Aortic atherosclerosis (ICD10-I70.0). Coronary artery calcification. 5. Enlarged pulmonic trunk, indicative of pulmonary arterial hypertension.     Electronically Signed   By: Lorin Picket Smith.D.   On: 06/23/2021 10:44         OV 11/25/2021  Subjective:  Patient ID: [name], female , DOB: June 30, 1940 , age 29 y.o. , MRN: [id number] , ADDRESS: [place] [address] PCP Lawerance Cruel, MD Patient Care Team: Lawerance Cruel, MD as PCP - General (Family Medicine) Larey Dresser, MD as PCP - Advanced Heart Failure (Cardiology) Tamsen Roers, MD as Referring Physician (Family Medicine) Melrose Nakayama, MD as Consulting Physician (Cardiothoracic Surgery)  This Provider for this visit: Treatment Team:  Attending Provider: Brand Males, MD  11/25/2021 -   Chief Complaint  Patient presents with   Follow-up    PFT performed today.  Pt states she has been doing okay since last visit. States she has had congestion and has not felt that great since last visit.     HPI Samia LOAN OGUIN 81  y.o. -presents for follow-up with her son Simona Huh.  Overall she is stable.  She continues to use 3.5 L at rest and 5-6 L with exertion.  She states this is stable.  She continues to lose weight.  It is unintentional.  She is on nintedanib.  She says her primary care has ruled out other causes.  At this point in time she is still technically overweight so she is content losing weight.  Overall there is no appetite.  She still frustrated by the recurrent acute sinusitis.  She gets exposed to sick grandkids she feels that she is having sinus congestion right now with blockage in the last few weeks and intermittent white-yellow drainage.  She does not Nettie pot she has occasional wheezing.  Her blood eosinophils are quite elevated at 5 8 cells per cubic millimeter.  RAST allergy panel is positive.  She wants to do another course of antibiotic but she does not want to do prednisone because of intolerance.  After her blood results came back I advised Fas[place].  But at that time I did not realize she was not on inhaled corticosteroids.  Anyway she prefers to do inhaler first approach.  From a dyspnea standpoint she is stable.  She had pulmonary function test that is now stable for 1 year but is progressive over many years.  She prefers to continue her nintedanib at this point.  She does not want to do oral prednisone     CT Chest data    OV 02/22/2022  Subjective:  Patient ID: [name], female , DOB: 13-Jan-1941 , age 48 y.o. , MRN: [id number] , ADDRESS: [place] [address] PCP Lawerance Cruel, MD Patient Care Team: Lawerance Cruel, MD as PCP -  General (Family Medicine) Larey Dresser, MD as PCP - Advanced Heart Failure (Cardiology) Tamsen Roers, MD as Referring Physician (Family Medicine) Melrose Nakayama, MD as Consulting Physician (Cardiothoracic Surgery)  This Provider for this visit: Treatment Team:  Attending Provider: Brand Males,  MD    02/22/2022 -   Chief Complaint  Patient presents with   Follow-up    No c/o dry cough      HPI Tanayia G Seminara 81 y.o. -returns for follow-up.  Presents with her son Simona Huh.  Overall stable still continuing on 4 L nasal cannula but 3 weeks ago started having another bronchitic episode along with conjunctivitis symptoms or rhinitis.  A lot of cough and yellow mucus.  Slowly getting back to baseline as usual did not do prednisone.  She is tolerating her nintedanib well.  She continues on 4 L oxygen.  She keeps getting these recurrent bronchitic symptoms.  She has positive blood use and feels an elevated RAST allergy testing.  She wants another antibiotic to keep it handy.  She has not had a respiratory vaccines.  We will clear her biologic therapy particularly Berna Bue she is afraid of needles.  After much back-and-forth decision.  Her son is a Software engineer she has agreed to try [place].  Also of note she was able to get an echocardiogram but she states our office never scheduled it.  Of note last time I gave her Symbicort because of asthma symptoms but she is not taking it.  She believes she did not tolerate it.  CT Chest data  No results found.    OV 05/17/2022  Subjective:  Patient ID: [name], female , DOB: 1941/05/23 , age 82 y.o. , MRN: [id number] , ADDRESS: [place] [address] PCP Lawerance Cruel, MD Patient Care Team: Lawerance Cruel, MD as PCP - General (Family Medicine) Larey Dresser, MD as PCP - Advanced Heart Failure (Cardiology) Tamsen Roers, MD as Referring Physician (Family Medicine) Melrose Nakayama, MD as Consulting Physician (Cardiothoracic Surgery)  This Provider for this visit: Treatment Team:  Attending Provider: Brand Males, MD    #Interstitial lung disease (progressive) with chronic hypoxemic respiratory failure - chronic HP dx -Per [facility] September 2016: Disease present at least since  2008  - Surgical lung biopsy  November 20, 2014 at [place]:   - local opinion: diagnostic of usual interstitial pneumonia (UIP). Interestingly, there are scattered non-necrotizing granulomata in the pleura.  -Second opinion September 2016 at [facility]: pathology was reviewed at our multidisciplinary ILD conference and noted to have a variegated pattern of fibrosis with some airway centricity, granulomas noted both near the airway and more peripherally. Minimal inflammation was noted. Features not c/w with UIP. Few giant cells with cholesterol clefts. Overall pattern most in keeping with chronic HP   - 3rd opinon: June 2021 [facility] ILD [place] - Hypersensitivity Pneumonitis   -High-resolution CT chest including April 09, 2018: Lack of craniocaudal gradient and significant air trapping suggestive of chronic hypersensitivity pneumonitis [September 2016 [facility] evaluation thought CT was inconsistent with UIP]  - May 2021: stability since 2019 but progression sice 2016  -Clinical diagnosis of chronic hypersensitivity pneumonitis   - dx given Sept 2016 at [place]  -No clear-cut exposure identified at [facility] evaluation September 2016 by Dr. Dorothyann Peng  -Steroids recommended but patient preferred not to take it September 2016 at [facility]   -Positive RAST allergy panel  (grass, dustmike) and normal IgE but elevated  blood eosinophilia in April 2023  - 500cell April 2023  - 400ccells Sept 2023   - Started Symbicort June 2023  -Normal right heart catheterization May 2022  -Unintentional weight loss noted in 2023  = Presumed secondary to nintedanib  -Treatment   - deferred steroids sept 2016 visit at [facility] with Dr Dorothyann Peng and again at [facility] in June 2021, Apr 2022  - nintedanib sole Rx since March 2020  -Inhaled nitric oxide versus placebo research protocol: Withdrawal consent and follow-up March 2023  (study results launched in late spring 2023 and negative]   05/17/2022 -   Chief  Complaint  Patient presents with   Follow-up    Pt has been on an abx which she recently finished and states she does not feel like she is over a respiratory virus she picked up. States that her breathing is about the same. Does have complaints of a cough.     HPI Analisa Sandi Mealy 81 y.o. -Presents with her son Simona Huh.  Since her last visit she called in again for for antibiotic.  We gave this to her.  But also felt she needed prednisone.  She reluctantly took prednisone and this actually did help but now that the to have weaned off she is getting symptomatic again with cough and yellow sputum and worsening shortness of breath and respiratory exacerbation and sinus congestion.  However's overall respiratory status is stable on 4 L oxygen.  She is consistently had high eosinophils peripherally.  Last time I recommended Berna Bue but she declined.  This time she is more open to taking it after much conversation.  She is again requesting another course of antibiotics.  She feels the 5 days of amoxicillin was not enough in September 2023.  She think she needs a 10-day course.  She will take a prednisone handy and will use it with the end of the amoxicillin taper when it is closer to Christmas time when the family is altogether.  She wants to feel better for Christmas.    SYMPTOM SCALE - ILD 07/29/2019  10/03/2019  12/03/2019 217# 03/09/2020  09/24/2020 207# 08/25/2021 - withdrew from iNP sutdy Weight 201# 09/28/2021 - 202#  11/25/2021 - 193#  02/22/2022 194# 05/17/2022   O2 use 3L at rest, occ 4L with exertion 3L at rest and 4L with exertion. Acute bronchitis 2 weeks ago 3L rest, 4 L exertion 3-4L [place]. Doing rehab 3/4L [place] pulse . On ofev 3L rest, 4L with eretiion. Weight 201# Treated oxygen with rest and 4 L exertion and pulse 3.5L rest, 4-5L exertion 4L Max  4L  Shortness of Breath 0 -> 5 scale with 5 being worst (score 6 If unable to do)           At rest _0  Simple tasks -  showers, clothes change, eating, shaving _1 Household (dishes, doing bed, laundry) _2 2._3 Shopping _4 2._5 Walking level at own pace _6 Walking up Stairs _7 Total (30-36) Dyspnea Score _8 How bad is your cough? 1 3.5  More often lateley _9 3  How bad is your fatigue 0 3.5 2 Better with exercise 2._0 How bad is nausea _1  How bad is vomiting?  _2  How bad is diarrhea? _3  How bad is anxiety? 1 1._4 00 _5 How bad is depression _6 0 0 _7 Simple office walk 185 feet x  3 laps goal with forehead probe 09/24/2020  09/28/2021   O2 used ra (baseline uses 3-4L at home) ra  Number laps completed End of 1 lap out of intented 3 Stoppsed at 3/4 of 1st lap  Comments about pace slow avg with walker  Resting Pulse Ox/HR 94% and 87/min 95% and HR 83  Final Pulse Ox/HR 85% and 113/min 82% and HR 89  Desaturated </= 88% yes   Desaturated <= 3% points yes   Got Tachycardic >/= 90/min yes   Symptoms at end of test Mod dyspnea   Miscellaneous comments desat at end iof 1 lap. STable on room air     PFT     Latest Ref Rng & Units 11/25/2021    3:10 PM 09/24/2020    2:56 PM 12/03/2019   11:39 AM 12/03/2019    9:57 AM 07/19/2019    3:54 PM 05/01/2017    2:05 PM 09/27/2016    1:56 PM  PFT Results  FVC-Pre L 1.58  1.52  1.70  1.66  1.61  1.71  1.86   FVC-Predicted Pre % 54  50  57  55  53  55  59   FVC-Post L     1.87  1.72  1.91   FVC-Predicted Post %     61  55  61   Pre FEV1/FVC % % 79  80  81  82  81  87  86   Post FEV1/FCV % %     78  90  89   FEV1-Pre L 1.25  1.21  1.39  1.36  1.31  1.50  1.60   FEV1-Predicted Pre % 57  54  62  60  57  64  68   FEV1-Post L     1.46  1.56  1.70   DLCO uncorrected ml/min/mmHg 11.86  5.95  15.25  13.20  17.11  11.23  12.69   DLCO UNC% % 57  28  73  63  82  39   44   DLCO corrected ml/min/mmHg 11.97  5.95  15.25  13.20   10.63  12.07   DLCO COR %Predicted % 58  28  73  63   37  42   DLVA Predicted % 94  67  114  103  120  70  80   TLC L     3.63  3.37  3.49   TLC % Predicted %     65  61  63   RV % Predicted %     73  63  64        has a past medical history of Allergic rhinitis, Anxiety, Arthritis, Asthma, Carpal tunnel syndrome, CHF (congestive heart failure) (HCC), GERD (gastroesophageal reflux disease), History of bronchitis (3-34yr ago), History of colon polyps, Joint pain, Peripheral edema, Pneumonitis, hypersensitivity ([place] (05/2016), Pulmonary hypertension ([place] (06/30/2017), Shortness of  breath dyspnea, and Thyroid nodule.   reports that she has never smoked. She has never used smokeless tobacco.  Past Surgical History:  Procedure Laterality Date   BREAST CYST EXCISION Left 50 yrs ago   CARPELL TUNNELL SURG Right    COLONOSCOPY     ESOPHAGOGASTRODUODENOSCOPY     EYE SURGERY     bilateral cataract surgery with lens implants   KNEE ARTHROSCOPY W/ MENISCAL REPAIR     left   LUNG BIOPSY Right 11/20/2014   Procedure: LUNG BIOPSY;  Surgeon: Melrose Nakayama, MD;  Location: [facility];  Service: Thoracic;  Laterality: Right;   REVERSE SHOULDER ARTHROPLASTY Right 06/07/2018   Procedure: REVERSE SHOULDER ARTHROPLASTY;  Surgeon: Justice Britain, MD;  Location: [facility];  Service: Orthopedics;  Laterality: Right;  166mn   RIGHT/LEFT HEART CATH AND CORONARY ANGIOGRAPHY N/A 10/14/2020   Procedure: RIGHT/LEFT HEART CATH AND CORONARY ANGIOGRAPHY;  Surgeon: MLarey Dresser MD;  [facility];  Service: Cardiovascular;  Laterality: N/A;   TOTAL KNEE ARTHROPLASTY Left 08/13/2013   Procedure: LEFT TOTAL KNEE ARTHROPLASTY;  Surgeon: MMauri Pole MD;  Location: [facility];  Service: Orthopedics;  Laterality: Left;   UMBILICAL HERNIA REPAIR     VIDEO ASSISTED THORACOSCOPY Right 11/20/2014   Procedure: RIGHT  VIDEO ASSISTED THORACOSCOPY WITH  WEDGE LUNG BIOPSIES RIGHT UPPER,MIDDLE AND LOWER LOBES ,PLACEMENT OF ON Q PAIN PUMP;  Surgeon: SMelrose Nakayama MD;  [facility]  Service: Thoracic;  Laterality: Right;    Allergies  Allergen Reactions   Adhesive [Tape] Itching and Rash   Latex Itching and Swelling   Doxycycline Rash   Sulfonamide Derivatives Other (See Comments)    Unknown    Immunization History  Administered Date(s) Administered   Fluad Quad(high Dose 65+) 02/07/2019, 05/06/2021, 02/22/2022   Influenza, High Dose Seasonal PF 04/20/2016, 02/23/2017, 02/12/2018   Influenza-Unspecified 03/06/2014, 02/18/2015   PFIZER(Purple Top)SARS-COV-2 Vaccination 07/12/2019, 08/06/2019, 04/12/2020   Pneumococcal Conjugate-13 04/14/2014   Pneumococcal Polysaccharide-23 06/14/2017   Tdap 06/14/2017   Zoster, Live 04/24/2013    Family History  Problem Relation Age of Onset   Tuberculosis Father    Tuberculosis Paternal Uncle      Current Outpatient Medications:    amoxicillin (AMOXIL) 500 MG capsule, Take 1 capsule (500 mg total) by mouth 3 (three) times daily., Disp: 30 capsule, Rfl: 0   aspirin 325 MG tablet, Take 650 mg by mouth daily as needed for moderate pain or headache., Disp: , Rfl:    Cholecalciferol (DIALYVITE VITAMIN D 5000) 125 MCG (5000 UT) capsule, Take 5,000 Units by mouth daily., Disp: , Rfl:    fluticasone (FLONASE) 50 MCG/ACT nasal spray, Place 1 spray into both nostrils daily as needed for allergies. , Disp: , Rfl:    furosemide (LASIX) 20 MG tablet, as needed., Disp: , Rfl:    ibuprofen (ADVIL) 200 MG tablet, Take 200-400 mg by mouth every 6 (six) hours as needed for headache or moderate pain., Disp: , Rfl:    loratadine (CLARITIN) 5 MG chewable tablet, Chew 5-10 mg by mouth daily as needed for allergies. , Disp: , Rfl:    Multiple Vitamin (MULTIVITAMIN WITH MINERALS) TABS tablet, Take 1 tablet by mouth daily., Disp: , Rfl:    Nintedanib (OFEV) 150 MG CAPS, Take 1 capsule (150 mg total) by mouth  2 (two) times daily., Disp: 60 capsule, Rfl: 2   predniSONE (DELTASONE) 10 MG tablet, Take 4 tablets (40 mg total) by mouth daily with breakfast  for 1 day, THEN 3 tablets (30 mg total) daily with breakfast for 1 day, THEN 2 tablets (20 mg total) daily with breakfast for 1 day, THEN 1 tablet (10 mg total) daily with breakfast for 1 day, THEN 0.5 tablets (5 mg total) daily with breakfast for 1 day., Disp: 10.5 tablet, Rfl: 0   Respiratory Therapy Supplies (FLUTTER) DEVI, Use as directed., Disp: 1 each, Rfl: 0   sodium chloride HYPERTONIC 3 % nebulizer solution, Take by nebulization in the morning and at bedtime., Disp: 750 mL, Rfl: 1   VENTOLIN HFA 108 (90 Base) MCG/ACT inhaler, INHALE 2 PUFFS INTO THE LUNGS EVERY 6 HOURS AS NEEDED FOR WHEEZING OR SHORTNESS OF BREATH, Disp: 18 g, Rfl: 6   vitamin B-12 (CYANOCOBALAMIN) 1000 MCG tablet, Take 1,000 mcg by mouth every other day., Disp: , Rfl:       Objective:   Vitals:   05/17/22 1105  BP: 130/76  Pulse: 86  Temp: 97.9 F (36.6 C)  TempSrc: Oral  SpO2: 96%  Weight: 193 lb 9.6 oz (87.8 kg)  Height: _0  (1.702 Smith)    Estimated body mass index is 30.32 kg/Smith as calculated from the following:   Height as of this encounter: _1  (1.702 Smith).   Weight as of this encounter: 193 lb 9.6 oz (87.8 kg).  _2 @  Filed Weights   05/17/22 1105  Weight: 193 lb 9.6 oz (87.8 kg)     Physical Exam    General: No distress.  Obese lady on oxygen.  Has lost some weight over the time Neuro: Alert and Oriented x 3. GCS 15. Speech normal Psych: Pleasant Resp:  Barrel Chest - no.  Wheeze - YES occasional, Crackles -yes scattered, No overt respiratory distress CVS: Normal heart sounds. Murmurs - no Ext: Stigmata of Connective Tissue Disease - no HEENT: Normal upper airway. PEERL +. No post nasal drip        Assessment:       ICD-10-CM   1. ILD (interstitial lung disease) (HCC)  J84.9 CBC with Differential/Platelet    B Nat Peptide     Hepatic function panel    Sedimentation rate    Pulmonary function test    ECHOCARDIOGRAM COMPLETE    Sedimentation rate    Hepatic function panel    B Nat Peptide    CBC with Differential/Platelet    Comp Met (CMET)    2. Hypersensitivity pneumonitis ([place])  J67.9     3. Encounter for therapeutic drug monitoring  Z51.81 Comp Met (CMET)    4. 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 CHronic resp failure with hypoxemia   - PFT wise stable from summer 2022 through SEpt 2023 (though progressive since 2018 and 2021) - echo sept 2023 suggests pulmonary hypertension  Plan  -  continue ofev  - continue o2 3-4LNC at rest and more with exertion - goal pulse ox > 88% - lab work as below  - like to discuss issue of cocnern for pulmonary hypertension at next visit    History of recurrent bronchitis  with acute bronchitis /asthma flare up - 02/22/2022, Oct 2023, Nov 2023, Dec 2023 History of eosinophilia 2018 and 2023 with postiive RAST panel e-asthma , allergic asthma with flare up   11/25/2021 and 02/22/2022 and Nov 2023 and 05/17/2022\   -another episode of sinusitis with some wheezing currently. FEatures are c/w allergic asthma. Repeated acute bronchitis flare up   Plan (share decision making) - check cbc with diff, bnp, LFT, ESR 05/17/2022 - check feno 05/17/2022 - Amoxcillin 575m three times daily  x 10 days (take script for furture use)  - Please take prednisone 40 mg x1 day, then 30 mg x1 day, then 20 mg x1 day, then 10 mg x1 day, and then 5 mg x1 day and stop  - you can start anytime next 1-2 weeks depending on how you feel - START  fasenra due  to recurrent flare up  - counseled extensively - do spirometry and dlco in 3-4 months   Followup  - Timesha Cervantez visit - 30 min in 12-16  weeks   - symptoms score and walk test at followup  High complex medical condition requiring high risk prescription with intensive therapeutic monitoring requirement   SIGNATURE    Dr. MBrand Males Smith.D., F.C.C.P,  Pulmonary and Critical Care Medicine Staff Physician, CL[place]olndaleDirector - Interstitial Lung Disease  Program  Pulmonary [place] [id number] Pager: [phone] If no answer or between  15:00h - 7:00h: call [phone] Telephone: [phone]  6:53 PM 05/17/2022

## 2022-05-31 ENCOUNTER — Encounter: Payer: Self-pay | Admitting: Internal Medicine

## 2022-05-31 DIAGNOSIS — J4550 Severe persistent asthma, uncomplicated: Secondary | ICD-10-CM | POA: Insufficient documentation

## 2022-05-31 NOTE — Telephone Encounter (Signed)
Called patient regarding [name] and receiving at infusion center. She requested I talk to her son, Simona Huh, who handles her bills. He states he is okay with her receiving Fasenra at infusion center. Reviewed 300% [employer] income criteria for [place] PAP. Patient's son states that she does not qualify.  Referral placed to [employer] today.  Dose: 87m SQ at Week 0, Week 4, Week 8 then every 8 weeks thereafter. No premedications required.  Advised both patient and son that treatment likely won't begin until after the new year. They verbalized understanding  DKnox Saliva PharmD, MPH, BCPS, CPP Clinical Pharmacist (Rheumatology and Pulmonology)

## 2022-06-01 ENCOUNTER — Telehealth: Payer: Self-pay | Admitting: Pharmacy Technician

## 2022-06-01 NOTE — Telephone Encounter (Signed)
Auth Submission: NO AUTH NEEDED Payer: MEDICARE A/B & BCBS SUPP Medication & CPT/J Code(s) submitted: [name] (Benralizumab) [phone] Route of submission (phone, fax, portal):  Phone # Fax # Auth type: Buy/Bill Units/visits requested: 6 DOSES Reference number:  Approval from: 06/01/22 to 06/06/23

## 2022-06-03 NOTE — Telephone Encounter (Signed)
First Fasenra injection scheduled for 06/14/2022. Will f/u to ensure completed  Knox Saliva, PharmD, MPH, BCPS, CPP Clinical Pharmacist (Rheumatology and Pulmonology)

## 2022-06-14 ENCOUNTER — Ambulatory Visit (INDEPENDENT_AMBULATORY_CARE_PROVIDER_SITE_OTHER): Payer: Medicare Other

## 2022-06-14 ENCOUNTER — Encounter: Payer: Self-pay | Admitting: Internal Medicine

## 2022-06-14 VITALS — BP 166/68 | HR 81 | Temp 98.6°F | Resp 18 | Ht 67.0 in | Wt 192.8 lb

## 2022-06-14 DIAGNOSIS — J4550 Severe persistent asthma, uncomplicated: Secondary | ICD-10-CM | POA: Diagnosis not present

## 2022-06-14 MED ORDER — BENRALIZUMAB 30 MG/ML ~~LOC~~ SOSY
30.0000 mg | PREFILLED_SYRINGE | Freq: Once | SUBCUTANEOUS | Status: AC
  Administered 2022-06-14: 30 mg via SUBCUTANEOUS

## 2022-06-14 NOTE — Progress Notes (Signed)
Diagnosis: Asthma  Provider:  Marshell Garfinkel MD  Procedure: Injection  Fasenra (Benralizumab), Dose: 30 mg, Site: subcutaneous, Number of injections: 1  Post Care: Observation period completed  Discharge: Condition: Good, Destination: Home . AVS provided to patient.   Performed by:  Adelina Mings, LPN

## 2022-06-21 ENCOUNTER — Ambulatory Visit (HOSPITAL_COMMUNITY): Payer: Medicare Other | Attending: Internal Medicine

## 2022-06-21 ENCOUNTER — Encounter: Payer: Self-pay | Admitting: Internal Medicine

## 2022-06-21 DIAGNOSIS — R0602 Shortness of breath: Secondary | ICD-10-CM | POA: Insufficient documentation

## 2022-06-21 DIAGNOSIS — J849 Interstitial pulmonary disease, unspecified: Secondary | ICD-10-CM | POA: Diagnosis not present

## 2022-06-21 LAB — ECHOCARDIOGRAM COMPLETE
AR max vel: 1.38 cm2
AV Area VTI: 1.47 cm2
AV Area mean vel: 1.51 cm2
AV Mean grad: 9 mmHg
AV Peak grad: 17.8 mmHg
Ao pk vel: 2.11 m/s
Area-P 1/2: 3.39 cm2
P 1/2 time: 342 msec
S' Lateral: 2.8 cm

## 2022-06-23 ENCOUNTER — Telehealth: Payer: Self-pay | Admitting: Internal Medicine

## 2022-06-23 NOTE — Telephone Encounter (Signed)
Called and spoke with pt letting her know the results and recs in regards to the echo and she verbalized understanding.   Routing encounter to Dr. Aundra Dubin as it seems like he was the one who did pt's right heart cath in 2022. If Dr. Aundra Dubin states that a new referral needs to be placed, will get that taken care of.   Dr. Aundra Dubin, please see message from Dr. Chase Caller as well as pt's recent echo and OV with Dr. Chase Caller.

## 2022-06-23 NOTE — Telephone Encounter (Signed)
Echo again in January 2024 shows pulmonary hypertension.  She did have a normal right heart catheterization 2 years ago but she could have developed pulmonary hypertension now and this could be contributing to her shortness of breath and worsening oxygen needs.  Plan - Refer Dr. Kirk Ruths McLean/Dr. Glori Bickers who did the right heart cath 2 years ago.

## 2022-06-23 NOTE — Telephone Encounter (Signed)
If she is agreeable, we can schedule repeat RHC for her.

## 2022-06-23 NOTE — Telephone Encounter (Signed)
I was able to get pt on her mobile number [phone]  when I called her. Just an FYI.

## 2022-06-23 NOTE — Telephone Encounter (Signed)
Attempted to contact pt to schedule Phone: [phone] (H)  line busy x 3

## 2022-06-28 NOTE — Telephone Encounter (Signed)
Pt would like to hold on scheduling procedure at this time with travels for out of town funeral coming up. Will return call to schedule once she returns. Note will like to speak with provider prior (consult) to scheduling procedure.

## 2022-06-29 ENCOUNTER — Telehealth: Payer: Self-pay | Admitting: Internal Medicine

## 2022-07-01 MED ORDER — OFEV 150 MG PO CAPS: 1.0000 | ORAL_CAPSULE | Freq: Two times a day (BID) | ORAL | 4 refills | Status: DC

## 2022-07-01 NOTE — Telephone Encounter (Signed)
Refill sent for OFEV to [facility]: [phone]  Dose: 150 mg twice daily  Last OV: 05/17/2022 Provider: Dr. Chase Caller  Next OV: not scheduled  LFTs on 05/17/2022 wnl.  We did not secure grant for patient and there are currently no grants open for pulmonary fibrosis. ATC patient. Left VM on mobile number on file. Home phone just kept ringing. Request return call to discuss applying for patient assistance  Knox Saliva, PharmD, MPH, BCPS Clinical Pharmacist (Rheumatology and Pulmonology)

## 2022-07-05 NOTE — Telephone Encounter (Signed)
ATC patient. Home phone went to busy tone immediately. Called mobile number on file. Left VM requesting return call as no pulmonary fibrosis grants are currently open  Knox Saliva, PharmD, MPH, BCPS, CPP Clinical Pharmacist (Rheumatology and Pulmonology)

## 2022-07-07 ENCOUNTER — Other Ambulatory Visit (HOSPITAL_COMMUNITY): Payer: Self-pay

## 2022-07-11 ENCOUNTER — Encounter: Payer: Self-pay | Admitting: Internal Medicine

## 2022-07-12 ENCOUNTER — Ambulatory Visit (INDEPENDENT_AMBULATORY_CARE_PROVIDER_SITE_OTHER): Payer: Medicare Other

## 2022-07-12 VITALS — BP 169/79 | HR 82 | Temp 97.9°F | Resp 22 | Ht 67.0 in | Wt 194.4 lb

## 2022-07-12 DIAGNOSIS — J4550 Severe persistent asthma, uncomplicated: Secondary | ICD-10-CM

## 2022-07-12 MED ORDER — BENRALIZUMAB 30 MG/ML ~~LOC~~ SOSY
30.0000 mg | PREFILLED_SYRINGE | Freq: Once | SUBCUTANEOUS | Status: AC
  Administered 2022-07-12: 30 mg via SUBCUTANEOUS
  Filled 2022-07-12: qty 1

## 2022-07-12 NOTE — Progress Notes (Signed)
Diagnosis: Asthma  Provider:  Marshell Garfinkel MD  Procedure: Injection  Fasenra (Benralizumab), Dose: 30 mg, Site: subcutaneous, Number of injections: 1  Post Care:  n/a  Discharge: Condition: Good, Destination: Home . AVS provided to patient.   Performed by:  Adelina Mings, LPN

## 2022-07-15 NOTE — Telephone Encounter (Signed)
Patient received second Fasenra dose on 07/12/2022. Third dose scheduled for 08/09/2022  Knox Saliva, PharmD, MPH, BCPS, CPP Clinical Pharmacist (Rheumatology and Pulmonology)

## 2022-07-19 ENCOUNTER — Telehealth: Payer: Self-pay | Admitting: Internal Medicine

## 2022-07-27 NOTE — Telephone Encounter (Signed)
Spoke with patient regarding Ofe[place]. She sates she is on waitlist for the grant she qualifies for (likely [place]), Her son is working on something for this but she doesn't know what it specifically is. Advised her to have him follow-up with [place] if it is in regards to patient assistance or anything tat requires prescriber signature  She was inquriing about cardiologist's information to schedule procedure. Per chart review, patient was going to repeat RHC per Dr. Aundra Dubin. Patient had deferred on scheduling due to grieving loss in family and travel to [place]. Provided her with HF clinic phone number (though not sure if this is correct). Advised that Dr. Chase Caller wanted to repeat RHC. She will call and see if she can get it scheduled  Knox Saliva, PharmD, MPH, BCPS, CPP Clinical Pharmacist (Rheumatology and Pulmonology)

## 2022-08-09 ENCOUNTER — Ambulatory Visit (INDEPENDENT_AMBULATORY_CARE_PROVIDER_SITE_OTHER): Payer: Medicare Other | Admitting: *Deleted

## 2022-08-09 VITALS — BP 155/83 | HR 90 | Temp 97.4°F | Resp 16 | Ht 66.0 in | Wt 191.0 lb

## 2022-08-09 DIAGNOSIS — J4550 Severe persistent asthma, uncomplicated: Secondary | ICD-10-CM

## 2022-08-09 MED ORDER — BENRALIZUMAB 30 MG/ML ~~LOC~~ SOSY
30.0000 mg | PREFILLED_SYRINGE | Freq: Once | SUBCUTANEOUS | Status: AC
  Administered 2022-08-09: 30 mg via SUBCUTANEOUS

## 2022-08-09 NOTE — Progress Notes (Signed)
Diagnosis: Asthma  Provider:  Marshell Garfinkel MD  Procedure: Injection  Fasenra (Benralizumab), Dose: 30 mg, Site: subcutaneous, Number of injections: 1  Post Care: Observation period completed  Discharge: Condition: Good, Destination: Home . AVS Provided  Performed by:  Oren Beckmann, RN

## 2022-08-12 NOTE — Telephone Encounter (Signed)
Front desk -  pls give appt for May

## 2022-08-23 DIAGNOSIS — M25512 Pain in left shoulder: Secondary | ICD-10-CM | POA: Diagnosis not present

## 2022-08-23 DIAGNOSIS — M25561 Pain in right knee: Secondary | ICD-10-CM | POA: Diagnosis not present

## 2022-08-23 DIAGNOSIS — M19012 Primary osteoarthritis, left shoulder: Secondary | ICD-10-CM | POA: Diagnosis not present

## 2022-08-23 DIAGNOSIS — M1711 Unilateral primary osteoarthritis, right knee: Secondary | ICD-10-CM | POA: Diagnosis not present

## 2022-08-30 DIAGNOSIS — M19012 Primary osteoarthritis, left shoulder: Secondary | ICD-10-CM | POA: Diagnosis not present

## 2022-09-23 ENCOUNTER — Ambulatory Visit (INDEPENDENT_AMBULATORY_CARE_PROVIDER_SITE_OTHER): Payer: Medicare Other | Admitting: Internal Medicine

## 2022-09-23 DIAGNOSIS — J849 Interstitial pulmonary disease, unspecified: Secondary | ICD-10-CM | POA: Diagnosis not present

## 2022-09-23 LAB — PULMONARY FUNCTION TEST
DL/VA % pred: 88 %
DL/VA: 3.54 ml/min/mmHg/L
DLCO cor % pred: 56 %
DLCO cor: 11.24 ml/min/mmHg
DLCO unc % pred: 56 %
DLCO unc: 11.24 ml/min/mmHg
FEF 25-75 Pre: 1.25 L/sec
FEF2575-%Pred-Pre: 87 %
FEV1-%Pred-Pre: 57 %
FEV1-Pre: 1.2 L
FEV1FVC-%Pred-Pre: 111 %
FEV6-%Pred-Pre: 54 %
FEV6-Pre: 1.43 L
FEV6FVC-%Pred-Pre: 103 %
FVC-%Pred-Pre: 52 %
FVC-Pre: 1.46 L
Pre FEV1/FVC ratio: 82 %
Pre FEV6/FVC Ratio: 98 %

## 2022-09-23 NOTE — Patient Instructions (Signed)
Spiro/DLCO performed today.  

## 2022-09-23 NOTE — Progress Notes (Signed)
Spiro/DLCO performed today.  

## 2022-09-26 ENCOUNTER — Encounter: Payer: Medicare Other | Admitting: Internal Medicine

## 2022-09-26 ENCOUNTER — Ambulatory Visit: Payer: Medicare Other | Admitting: Internal Medicine

## 2022-09-26 ENCOUNTER — Ambulatory Visit (INDEPENDENT_AMBULATORY_CARE_PROVIDER_SITE_OTHER): Payer: Medicare Other | Admitting: Internal Medicine

## 2022-09-26 ENCOUNTER — Encounter: Payer: Self-pay | Admitting: Internal Medicine

## 2022-09-26 VITALS — BP 110/70 | HR 64 | Ht 67.0 in | Wt 190.0 lb

## 2022-09-26 DIAGNOSIS — R0609 Other forms of dyspnea: Secondary | ICD-10-CM

## 2022-09-26 DIAGNOSIS — J679 Hypersensitivity pneumonitis due to unspecified organic dust: Secondary | ICD-10-CM

## 2022-09-26 DIAGNOSIS — J849 Interstitial pulmonary disease, unspecified: Secondary | ICD-10-CM | POA: Diagnosis not present

## 2022-09-26 DIAGNOSIS — D721 Eosinophilia, unspecified: Secondary | ICD-10-CM

## 2022-09-26 NOTE — Patient Instructions (Addendum)
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 CHronic resp failure with hypoxemia   - PFT wise stable from summer 2022 through June 2023 (though progressive since 2018 and 2021) and likely stable April 2024 - echo sept 2023 suggests pulmonary hypertension  Plan  -  continue ofev   - check safety labs 09/26/2022 for ofev = check bnp blood work 09/26/2022  - continue o2 3-4LNC at rest and more with exertion - goal pulse ox > 88% - lab work as below  - like to discuss issue of cocnern for pulmonary hypertension at next visit - do spirometry and dlco in 3-4 months    History of recurrent bronchitis with acute bronchitis /asthma flare up - 02/22/2022, Oct 2023, Nov 2023, Dec 2023 History of eosinophilia 2018 and 2023 with postiive RAST panel e-asthma , allergic asthma with flare up  Seems you handled allergy season beetter with fasenra and overall upper airwawy symptoms and respiratory symptoms are better on fasenra  Arthralgia  Harrington Challenger might be causing increased joint pain though it is not a known side effect    Plan (share decision making) - check cbc with diff, bnp, LFT,  09/26/2022 - Continue  fasenra due to recurrent flare up  - counseled extensively   - moinitor your joint pain - do spirometry and dlco in 3-4 months   Followup  - Almendra Loria visit - 30 min in 3-4 months  - symptoms score and walk test at followup

## 2022-09-26 NOTE — Progress Notes (Signed)
xxxxxxxxxxxxxxxxxxxxx  OV 07/29/2019 -transfer of care to Dr. Marchelle Gearing interstitial lung disease center.  Subjective:  Patient ID: [name], female , DOB: 02-17-1941 , age 82 y.o. , MRN: [id number] , ADDRESS: [address] [place] [address]   07/29/2019 -   Chief Complaint  Patient presents with   Follow-up    Pt states her breathing has become worse since last visit. States she also has an occ cough.   #Interstitial lung disease (progressive) with chronic hypoxemic respiratory failure - chronic HP dx -Per [facility] September 2016: Disease present at least since Oct 25, 2006  - Surgical lung biopsy  November 20, 2014 at [place]:   - local opinion: diagnostic of usual interstitial pneumonia (UIP). Interestingly, there are scattered non-necrotizing granulomata in the pleura.  -Second opinion September 2016 at [facility]: pathology was reviewed at our multidisciplinary ILD conference and noted to have a variegated pattern of fibrosis with some airway centricity, granulomas noted both near the airway and more peripherally. Minimal inflammation was noted. Features not c/w with UIP. Few giant cells with cholesterol clefts. Overall pattern most in keeping with chronic HP  -High-resolution CT chest including April 09, 2018: Lack of craniocaudal gradient and significant air trapping suggestive of chronic hypersensitivity pneumonitis [September 2016 [facility] evaluation thought CT was inconsistent with UIP]  -Clinical diagnosis of chronic hypersensitivity pneumonitis   - [facility]  -No clear-cut exposure identified at [facility] evaluation September 2016 by Dr. Ileene Hutchinson  -Steroids recommended but patient preferred not to take it September 2016 at [facility]  -Treatment   - deferred steroids sept 10-25-2014 visit at [facility] with Dr Lorella Nimrod  - nintedanib since March 2020   #Small hiatal hernia with acid reflux disease  Joy Navjot G Deignan 82 y.o. -is a  transfer of care from Dr. Kendrick Fries to Dr. Marchelle Gearing with interstitial lung disease questionnaire.  She was last seen by Dr. Kendrick Fries in March 2020.  After the onset of the COVID-19 pandemic Dr. Kendrick Fries is now fully in charge of the Covid hospital [facility].  Therefore he is not in the outpatient medical practice anymore.  Therefore patient has been transferred to the ILD center to Dr. Marchelle Gearing myself.  Is the first time I am meeting her and getting to know her.  She tells me that I took care of her husband for COPD a few years ago.  This was her ex-husband.  He passed away in 10-25-11 after hospitalization to the ICU under our service.  She is grateful for the care.  She tells me that she has interstitial lung disease and for the last year she has been on nintedanib.  Currently she is having difficulty with nintedanib supply.  She only has 2 weeks left.  She uses 3 L of oxygen at rest.  She says she has been doing that for the last 4 years.  It is stable usage.  I am not really sure what her pulse ox on room air would be.  She says occasionally she would crank it up to 4 L for exertion but she generally uses 3 L both at rest and with exertion.  She says she tolerates nintedanib fine. Her interstitial lung disease history was reviewed and summarized above.  It appears that she was not keen on taking prednisone and her 10-25-2014 visit with Dr. Ileene Hutchinson at [facility].  Therefore once the [facility] data was available she was started on nintedanib  a year ago.She wants to have a son involved in her visits.  Because of the COVID-19 pandemic he is waiting outside in the car.  She says at the next visit she wants to bring him in.   Her symptom score is listed below.  Her progressive lung function decline is also listed below and the pulmonary function test reviewed.  I personally reviewed and visualized and interpreted those.  Her DLCO is paradoxically higher.  I think this is an error.   ROS - per  Joy  OV 10/03/2019  Subjective:  Patient ID: [name], female , DOB: 20-Mar-1941 , age 82 y.o. , MRN: [id number] , ADDRESS: [address] [place] [address]   #Interstitial lung disease (progressive) with chronic hypoxemic respiratory failure - chronic HP dx -Per [facility] September 2016: Disease present at least since 2008  - Surgical lung biopsy  November 20, 2014 at [place]:   - local opinion: diagnostic of usual interstitial pneumonia (UIP). Interestingly, there are scattered non-necrotizing granulomata in the pleura.  -Second opinion September 2016 at [facility]: pathology was reviewed at our multidisciplinary ILD conference and noted to have a variegated pattern of fibrosis with some airway centricity, granulomas noted both near the airway and more peripherally. Minimal inflammation was noted. Features not c/w with UIP. Few giant cells with cholesterol clefts. Overall pattern most in keeping with chronic HP  -High-resolution CT chest including April 09, 2018: Lack of craniocaudal gradient and significant air trapping suggestive of chronic hypersensitivity pneumonitis [September 2016 [facility] evaluation thought CT was inconsistent with UIP]  -Clinical diagnosis of chronic hypersensitivity pneumonitis   - [facility]  -No clear-cut exposure identified at [facility] evaluation September 2016 by Dr. Ileene Hutchinson  -Steroids recommended but patient preferred not to take it September 2016 at [facility]  -Treatment   - deferred steroids sept 2016 visit at [facility] with Dr Lorella Nimrod  - nintedanib since March 2020   #Small hiatal hernia with acid reflux disease  10/03/2019 -   Chief Complaint  Patient presents with   Follow-up    SOB and cough with activity has increasd, Productive cough with yellow sputum     Joy [name] 82 y.o. -returns for follow-up with her son.  At this point in time the focus is to establish the true nature of interstitial  lung disease.  She has brought her son with her.  However approximately 2 weeks ago she had acute bronchitis symptoms and called in.  Was given a cephalexin 5 days and prednisone 5 days.  These have helped immensely.  However she feels not back to baseline.  She feels she will benefit from another round of antibiotic with or without prednisone.  She finished ILD questionnaire and it is listed below.  We have not been able to discuss in a multidisciplinary case conference of 4. [facility] Integrated Comprehensive ILD Questionnaire  Symptoms: See below.    SYMPTOM SCALE - ILD 07/29/2019  10/03/2019   O2 use 3L at rest, occ 4L with exertion 3L at rest and 4L with exertion. Acute bronchitis 2 weeks ago  Shortness of Breath 0 -> 5 scale with 5 being worst (score 6 If unable to do)   At rest 0 1  Simple tasks - showers, clothes change, eating, shaving 1 2  Household (dishes, doing bed, laundry) 2 3  Shopping 2 2  Walking level at own pace 1 2  Walking up Stairs 6 4  Total (30-36) Dyspnea Score 12 14  How bad is your cough? 1 3.5  How bad is your fatigue 0 3.5  How bad is nausea 0 0  How bad is vomiting?  0 0  How bad is diarrhea? 0 0  How bad is anxiety? 1 1.5  How bad is depression 1 1     Past Medical History :  -Denies any collagen vascular disease or vasculitis.  Denies any knowledge of prior pulmonary hypertension.  Denies diabetes or thyroid disease.  Denies any stroke.  Denies mononucleosis denies tuberculosis denies pneumonia denies blood clots denies heart disease denies pleurisy   ROS: Positive for shoulder pain for the last several years but otherwise no dry eyes.  No Raynaud's.  No weight loss.  No nausea no vomiting no rash   FAMILY HISTORY of LUNG DISEASE: Denies any pulmonary fibrosis or COPD or cystic fibrosis of hypersensitive pneumonitis or any other lung disease.   EXPOSURE HISTORY: Never smoked cigarettes.  No passive smoking.  No marijuana.  No vaping no cocaine no  intravenous drug use.   HOME and HOBBY DETAILS : Current home is 25 years she is lived there for 5 years.  In [place] there is no dampness.  No mold or mildew no feather pillow.  No steam iron use no Jacuzzi use no nebulizer use no humidifier use.  No pet birds or parakeets.  No pet gerbils no pet hamsters no pet rabbits no rodents.  No mold in the [facility] duct no music habits such as wind instruments no gardening.  No flood of water damage.  No strong mats.  No exposure to animals at work.  History update in April 2023: She reported that between 2010 2014 she lived in the mother's house that was built in the 1950s.  She thought there was mold in this but never confirmed.  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  She also had a long-term tendency to clean her home heavily with Clorox.  She inhaled these fumes according to the son.  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   PULMONARY TOXICITY HISTORY (27 items): She has done prednisone burst.  She does not want to do long-term prednisone.  She has been on long-term nintedanib but tolerating it well.    Testing -2018 echo with elevated pulmonary systolic pressure 55 mmHg  - nov 2019 Last CT - HRCT IMPRESSION: 1. Spectrum of findings compatible with fibrotic interstitial lung disease with extensive air trapping and absence of basilar gradient, strongly favoring chronic hypersensitivity pneumonitis. Findings have slightly progressed since 04/24/2017, with more clear progression since more remote chest CT of 06/24/2014. Findings are suggestive of an alternative diagnosis (not UIP) per consensus guidelines: Diagnosis of Idiopathic Pulmonary Fibrosis: An  Official ATS/ERS/JRS/ALAT Clinical Practice Guideline. [place], Iss 5, [phone], Feb 04 2017. 2. Stable mild mediastinal lymphadenopathy, most compatible with benign reactive adenopathy. 3. One vessel coronary atherosclerosis. 4. Small hiatal hernia.   Aortic Atherosclerosis (ICD10-I70.0).     Results for MIYU, FENDERSON (MRN [id number]) as of 07/29/2019 11:43  Ref. Range 07/04/2014 12:58 09/03/2015 11:04 09/27/2016 13:56 05/01/2017 14:05 07/19/2019  15:54  FVC-Pre Latest Units: L 2.13 1.93 1.86 1.71 1.61  FVC-%Pred-Pre Latest Units: % 66 61 59 55 53   Results for ALFREDO, COLLYMORE (MRN [id number]) as of 07/29/2019 11:43  Ref. Range 07/04/2014 12:58 09/03/2015 11:04 09/27/2016 13:56 05/01/2017 14:05 07/19/2019 15:54  DLCO unc Latest Units: ml/min/mmHg 13.70 14.70 12.69 11.23 17.11  DLCO unc % pred Latest Units: % 48 51 44 39 82     ROS  OV 12/03/2019  Subjective:  Patient ID: [name], female , DOB: [birth year] , age 63 y.o. , MRN: [id number] , ADDRESS: [place] [address]   12/03/2019 -   Chief Complaint  Patient presents with   Follow-up    cough is better but still present with exertion, breathing about the same since last visit    #Interstitial lung disease (progressive) with chronic hypoxemic respiratory failure - chronic HP dx -Per [facility] September 2016: Disease present at least since 2008  - Surgical lung biopsy  November 20, 2014 at [place]:   - local opinion: diagnostic of usual interstitial pneumonia (UIP). Interestingly, there are scattered non-necrotizing granulomata in the pleura.  -Second opinion September 2016 at [facility]: pathology was reviewed at our multidisciplinary ILD conference and noted to have a variegated pattern of fibrosis with some airway centricity, granulomas noted both near the airway and more peripherally. Minimal inflammation was noted. Features not c/w with UIP. Few giant  cells with cholesterol clefts. Overall pattern most in keeping with chronic HP   - 3rd opinon: June 2021 [facility] IL[facility] [place] - HP   -High-resolution CT chest including April 09, 2018: Lack of craniocaudal gradient and significant air trapping suggestive of chronic hypersensitivity pneumonitis [September 2016 [facility] evaluation thought CT was inconsistent with UIP]  -Clinical diagnosis of chronic hypersensitivity pneumonitis   - dx given Sept 2016 at [place]  -No clear-cut exposure identified at [facility] evaluation September 2016 by Dr. Dorothyann Peng  -Steroids recommended but patient preferred not to take it September 2016 at [place]   - deferred steroids sept 2016 visit at [facility] with Dr Sherri Sear  - nintedanib since March 2020   #Small hiatal hernia with acid reflux disease   Joy Makaylin G Bleiler 82 y.o. -presents with her son for follow-up.  She continues to be on 3 L at rest 4 L with exertion.  Her acute bronchitis from April 2021 is resolved.  Her overall symptoms are stable/slightly better after her acute bronchitis.  There is no interim complaints.  In the interim we did discuss her at her case conference.  The consensus diagnosis is that she has hypersensitive pneumonitis.  The only exposure is when she was working in the [employer] at in [place].  She and her son again denies any ongoing exposures.  Reviewed her pulmonary function test and it is stable compared to the recent ones.  There is progression over the last few years to several years.  In terms of a high-resolution CT chest is also stable since 2019 through 2021 but progressive when compared to earlier time points.  She was started on nintedanib in March 2020.  She is tolerating this quite well.  It appears that she and she went on nintedanib she is stable.  She yet to do pulmonary rehabilitation.  Last done few years ago.  Son feels that she needs to be more mobile She is yet to connect with the support group She did not get  referred to cardiology for  right heart catheterization. She wants a flutter valve for her associated bronchiectasis seen on CT scan    OV 03/09/2020   Subjective:  Patient ID: [name], female , DOB: 1940-10-24, age 44 y.o. years. , MRN: [id number],  ADDRESS: [place] [address] PCP  Lawerance Cruel, MD Providers : Treatment Team:  Attending Provider: Brand Males, MD   Chief Complaint  Patient presents with   Follow-up    Patient wears 4 liters oxygen with exertion, was prescribed celebrex they want to know if it is ok with OFEV.      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 Interestingly, there are scattered non-necrotizing granulomata in the pleura.  -Second opinion September 2016 at [facility]: pathology was reviewed at our multidisciplinary ILD conference and noted to have a variegated pattern of fibrosis with some airway centricity, granulomas noted both near the airway and more peripherally. Minimal inflammation was noted. Features not c/w with UIP. Few giant cells with cholesterol clefts. Overall pattern most in keeping with chronic HP   - 3rd opinon: June 2021 [facility] [place] - Hypersensitivity Pneumonitis   -High-resolution CT chest including April 09, 2018: Lack of craniocaudal gradient and significant air trapping suggestive of chronic hypersensitivity pneumonitis [September 2016 [facility] evaluation thought CT was inconsistent with UIP]  -Clinical diagnosis of chronic hypersensitivity pneumonitis   - dx given Sept 2016 at [place]  -No clear-cut exposure identified at [facility] evaluation September 2016 by Dr. Dorothyann Peng  -Steroids recommended but patient preferred not to  take it September 2016 at [place]   - deferred steroids sept 2016 visit at [facility] with Dr Dorothyann Peng and again at [facility] in June 2021  - nintedanib sole Rx since March 2020     [name] 82 y.o. -returns for follow-up with her son Simona Huh.  Since her last visit in June 2020 when she continues to be stable.  She is enrolled in the pulmonary rehabilitation program which has helped her fatigue and shortness of breath significantly.  She continues on nintedanib without any adverse side effects.  She wanted to know if it is okay to take cephalexin for arthralgia.  She has chronic osteoarthritis.  She wants to avoid NSAIDs.  In terms of oxygen use she is stable at 4 L nasal cannula.  She recently had pulmonary function test I reviewed this and it shows stability and is documented below.  Her weight continues to be stable.  We referred her for right heart catheterization with because of the Covid delta surge she deferred it.  Of note on February 28, 2020 while trying to get into the car out of her primary care physician's office she fell down and sustained abrasions and bruises injuries particularly in her lower extremity.  Since then her right kneecap patella area is red and warm.  There is a small incision injury there but this is healed.  She thinks it slightly better/stable.  She is not having any fever.  This has not been treated.      ROS - per Joy  CT chest high resolution - may 2021  IMPRESSION: 1. Spectrum of findings compatible with fibrotic interstitial lung disease with moderate air trapping and absence of apicobasilar  gradient. Findings are most compatible with chronic hypersensitivity pneumonitis. No interval progression since 2019 chest CT. Evidence of progression since baseline 2016 chest CT. Findings are suggestive of an alternative diagnosis (not UIP) per consensus guidelines: Diagnosis of Idiopathic Pulmonary Fibrosis: An Official ATS/ERS/JRS/ALAT  Clinical Practice Guideline. [place], Iss 5, [phone], Feb 04 2017. 2. Stable mild mediastinal lymphadenopathy, compatible with benign reactive adenopathy. 3. Small hiatal hernia. 4. Aortic Atherosclerosis (ICD10-I70.0).     Electronically Signed   By: Ilona Sorrel M.D.   On: 10/24/2019 15:02    OV 09/24/2020  Subjective:  Patient ID: [name], female , DOB: [birth year] , age 65 y.o. , MRN: [id number] , ADDRESS: [place] [address] PCP Lawerance Cruel, MD Patient Care Team: Lawerance Cruel, MD as PCP - General (Family Medicine) Tamsen Roers, MD as Referring Physician (Family Medicine) Melrose Nakayama, MD as Consulting Physician (Cardiothoracic Surgery)  This Provider for this visit: Treatment Team:  Attending Provider: Brand Males, MD    09/24/2020 -   Chief Complaint  Patient presents with   Follow-up    Review PFT results. Still reports cough, sometimes productive with clear phlegm.      #Interstitial lung disease (progressive) with chronic hypoxemic respiratory failure - chronic HP dx -Per [facility] September 2016: Disease present at least since 2008  - Surgical lung biopsy  November 20, 2014 at [place]:   - local opinion: diagnostic of usual interstitial pneumonia (UIP). Interestingly, there are scattered non-necrotizing granulomata in the pleura.  -Second opinion September 2016 at [facility]: pathology was reviewed at our multidisciplinary ILD conference and noted to have a variegated pattern of fibrosis with some airway centricity, granulomas noted both near the airway and more peripherally. Minimal inflammation was noted. Features not c/w with UIP. Few giant cells with cholesterol clefts. Overall pattern most in keeping with chronic HP   - 3rd opinon: June 2021 [facility] [place] - Hypersensitivity Pneumonitis   -High-resolution CT chest including April 09, 2018: Lack of  craniocaudal gradient and significant air trapping suggestive of chronic hypersensitivity pneumonitis [September 2016 [facility] evaluation thought CT was inconsistent with UIP]  - May 2021: stability since 2019 but progression sice 2016  -Clinical diagnosis of chronic hypersensitivity pneumonitis   - dx given Sept 2016 at [place]  -No clear-cut exposure identified at [facility] evaluation September 2016 by Dr. Dorothyann Peng  -Steroids recommended but patient preferred not to take it September 2016 at [place]   - deferred steroids sept 2016 visit at [facility] with Dr Dorothyann Peng and again at [facility] in June 2021, Apr 2022  - nintedanib sole Rx since March 2020       Joy [name] 82 y.o. -presents for follow-up.  Last seen in the fall 2021.  After that overall she is been stable but last month she picked up a respiratory infection got antibiotic and prednisone and then she is better.  Nevertheless she thinks her symptoms may be a little worse than baseline.  Still she says she gets by with 3-4 L of oxygen which is her baseline.  Subjective symptom score overall has not changed.  She had pulmonary function test today and this shows a decline it is all documented below.  I visualized this.  Her last CT scan of the chest was in May 2021.  1 year anniversary of the CT scan is coming up.  Over the winter  she declined to have right heart catheterization because of COVID-19.  But at this point in time with the improvement in the pandemic prevalence rate in the local community of COVID she is willing to have a right heart catheterization.  She is tolerating nintedanib just fine without any problems.  A walking desaturation test on room air is as below      OV 10/27/2020  Subjective:  Patient ID: [name], female , DOB: 1941/05/16 , age 82 y.o. , MRN: [id number] , ADDRESS: [place] [address] PCP Lawerance Cruel, MD Patient Care Team: Lawerance Cruel,  MD as PCP - General (Family Medicine) Larey Dresser, MD as PCP - Advanced Heart Failure (Cardiology) Tamsen Roers, MD as Referring Physician (Family Medicine) Melrose Nakayama, MD as Consulting Physician (Cardiothoracic Surgery)  This Provider for this visit: Treatment Team:  Attending Provider: Brand Males, MD   #Interstitial lung disease (progressive) with chronic hypoxemic respiratory failure - chronic HP dx -Per [facility] September 2016: Disease present at least since 2008  - Surgical lung biopsy  November 20, 2014 at [place]:   - local opinion: diagnostic of usual interstitial pneumonia (UIP). Interestingly, there are scattered non-necrotizing granulomata in the pleura.  -Second opinion September 2016 at [facility]: pathology was reviewed at our multidisciplinary ILD conference and noted to have a variegated pattern of fibrosis with some airway centricity, granulomas noted both near the airway and more peripherally. Minimal inflammation was noted. Features not c/w with UIP. Few giant cells with cholesterol clefts. Overall pattern most in keeping with chronic HP   - 3rd opinon: June 2021 [facility] [place] - Hypersensitivity Pneumonitis   -High-resolution CT chest including April 09, 2018: Lack of craniocaudal gradient and significant air trapping suggestive of chronic hypersensitivity pneumonitis [September 2016 [facility] evaluation thought CT was inconsistent with UIP]  - May 2021: stability since 2019 but progression sice 2016  -Clinical diagnosis of chronic hypersensitivity pneumonitis   - dx given Sept 2016 at [place]  -No clear-cut exposure identified at [facility] evaluation September 2016 by Dr. Dorothyann Peng  -Steroids recommended but patient preferred not to take it September 2016 at [place]   - deferred steroids sept 2016 visit at [facility] with Dr Dorothyann Peng and again at [facility] in June 2021, Apr 2022  - nintedanib sole Rx since March  2020    10/27/2020 -  TELEPHONE CALL  Type of visit: Telephone/Video Circumstance: COVID-19 national emergency Identification of patient [name] KARN with Jul 06, 1940 and MRN [id number] - 2 person identifier Risks: Risks, benefits, limitations of telephone visit explained. Patient understood and verbalized agreement to proceed Anyone else on call:  Patient location: [id number] This provider location: [address], [facility], [place], [place]   Joy [name] 82 y.o. -this telephone visit is for a few reasons  1)  grandson getting married in July in [place] and [facility] is elevated. Wants to go. Son Denies thiniong of taking her to [facility]. It is 4000 feet + /1300 meters. Advised them [place] is safeer from elevation  2) discussed heart catheterization results which are essentially normal.  3) discussed high-resolution CT chest because of concern of progression but in the last 1 year her ILD is stable on nintedanib.  She is happy about this  4) discuss future therapeutic options: At this point in time she is maxed out on therapy.  Not interested in prednisone.  She is on antifibrotic nintedanib.  We discussed the pulse inhaled nitric oxide study phase 3.  She is very interested in this.  We discussed about the device and also convenience and buying her oxygen backpack called [employer].  I believe she has a copy of the consent for the study.  We discussed the principles of this.  She is very interested.  She wants to hear from the research coordinator.  I have sent the research coordinator Lazaro Arms message.  Advised may be a few weeks.   Heart Cath 10/14/20  1. Minimally elevated PA pressure with PVR only 2.1.  2. Normal filling pressures.  3. No significant coronary disease.  RHC Procedural Findings: Hemodynamics (mmHg) RA mean 4 RV 35/3 PA 36/8, mean 18 PCWP mean 6 LV 165/12 AO 161/69  Oxygen saturations: PA 76% AO 100%  Cardiac Output  (Fick) 5.73  Cardiac Index (Fick) 2.69  PVR 2.1    RESEARCH VISI 08/25/2021     Title:  A Randomized, double-blind, placebo-controlled dose escalation and verification clinical study, to assess the safety and efficacy of pulsed, inhaled nitric oxide (iNO) in subjects at risk of pulmonary hypertension associated with pulmonary fibrosis on long term oxygen therapy (Part 1 and Part 2). Primary Endpoint: The placebo-corrected change for INOpulse in minutes of moderate to vigorous physical activity (MVPA) measured by actigraphy from baseline to month 4.  Duration of treatment: Study participants will receive iNO 45 mcg/kg IBW/hr versus placebo for 4 months (16 weeks) during Part 1. Then in Part 2: Open Label Extension (OLE) Study participants will be offered open label therapy at iNO 45 mcg/kg IBW/hr after completing the Part 1.  Protocol #: PULSE-PHPF-001 (REBUILD), ClinicalTrials.gov Identifier: [id number], **Sponsor is [employer], [place], [place] [address])     Xxxx  This visit for Subject MAHOGANIE BASHER with DOB: May 02, 1941 on 08/25/2021 for the above protocol is Visit/Encounter # OLE  and is for purpose of wihtdrawing frfom study . Subject/LAR expressed continued interest and consent in continuing as a study subject. Subject thanked for participation in research and contribution to science.   S: In the study visit.  She presents with her son.  She tells me that she is done with the study.  She does not want to continue with the study anymore.  Recently she has been noncompliant with using the inhaled nitric oxide device.  I spoke to her and she said after that she was completely compliant.  However this was giving an awful taste [dysgeusia].  She did not feel any benefit.  So she stopped it 24 hours ago.  She is only using it at night.  She also wants to withdraw from the study.  Per the protocol open label patients who stop using the study drug do not need to actively  follow-up but will only be followed up for collection of vital status.  I tried to encourage her to continue to participate in the study.  Did indicate to her that her pulmonary function test had stabilized but she is not interested at all.  Other issues - Interstitial lung disease: She feels it is overall stable.  3 L at rest 4 L with exertion.  Pulmonary function test most recently the [facility] is down but it seemed to improve while she was inhaled nitric oxide.  The DLCO is also stable.  She is tolerating nintedanib well without any problems.  She continues to be able to do her groceries with walking with a walker.  She did have a high-resolution CT chest in January 2023.  Radiologist has not commented on any progression.  I written to the radiologist  #Weight loss: She believes she is lost a lot of weight.  The weight loss profile as below.  She says this is intentional.  I did indicate to her that nintedanib can make people lose weight but she believes it is all intentional.  She feels she is in a healthy position right now.  Therefore she wants to still continue nintedanib  #History of fall.:  She fell on 1 month ago.  She just had a mild bruise to her right knee but did not need any medication.  No injury.  It was a trip and fall when oxygen was delivered.  #Recurrent bronchitis/cough: Saw nurse practitioner acutely 05/26/2021 with productive cough increased mucus buildup that was green and yellow.  She was given prednisone taper and Augmentin.  Chest x-ray at that time showed stable ILD.  She followed up with nurse practitioner 06/14/2021 reporting while on antibiotic and prednisone she was better but then she started deteriorating again but this time did not have any fever but did have yellow and white secretions.  Patient was given a prednisone taper and a CT chest was ordered.  The high-resolution CT chest shows ILD but radiologist has not commented on progression.  She then followed up with nurse  practitioner 06/28/2021.  She was using 3% saline.  After that she is following with me at this visit.  She is saying she continues to have cough.  She feels she needs another course of 8-day amoxicillin.  She is very specific about the amount of oxacillin she needs.  She does not like prednisone but is open to taking a short course of prednisone.  We agreed on a 5-day taper.    OV 09/28/2021  Subjective:  Patient ID: [name], female , DOB: [birth year] , age 43 y.o. , MRN: [id number] , ADDRESS: [place] [address] PCP Lawerance Cruel, MD Patient Care Team: Lawerance Cruel, MD as PCP - General (Family Medicine) Larey Dresser, MD as PCP - Advanced Heart Failure (Cardiology) Tamsen Roers, MD as Referring Physician (Family Medicine) Melrose Nakayama, MD as Consulting Physician (Cardiothoracic Surgery)  This Provider for this visit: Treatment Team:  Attending Provider: Brand Males, MD    09/28/2021 -   Chief Complaint  Patient presents with   Follow-up    PFT performed with research 08/25/21. Pt states that she has had a cough as well as some congestion. Also has some wheezing.     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 Interestingly, there are scattered non-necrotizing granulomata in the pleura.  -Second opinion September 2016 at [facility]: pathology was reviewed at our multidisciplinary ILD conference and noted to have a variegated pattern of fibrosis with some airway centricity, granulomas noted both near the airway and more peripherally. Minimal inflammation was noted. Features not c/w with UIP. Few giant cells with cholesterol clefts. Overall pattern most in keeping with  chronic HP   - 3rd opinon: June 2021 [facility] ILD [place] - Hypersensitivity Pneumonitis   -High-resolution CT chest including April 09, 2018: Lack of craniocaudal gradient and significant air trapping suggestive of  chronic hypersensitivity pneumonitis [September 2016 [facility] evaluation thought CT was inconsistent with UIP]  - May 2021: stability since 2019 but progression sice 2016  -Clinical diagnosis of chronic hypersensitivity pneumonitis   - dx given Sept 2016 at [place]  -No clear-cut exposure identified at [facility] evaluation September 2016 by Dr. Dorothyann Peng  -Steroids recommended but patient preferred not to take it September 2016 at [facility]   -Normal right heart catheterization May 2022  -Treatment   - deferred steroids sept 2016 visit at [facility] with Dr Dorothyann Peng and again at [facility] in June 2021, Apr 2022  - nintedanib sole Rx since March 2020  -Inhaled nitric oxide versus placebo research protocol: Withdrawal consent and follow-up March 2023  Joy [name] 82 y.o. -here for follow-up.  Presents with son Simona Huh she is now off the inhaled nitric oxide protocol.  She continues to take nintedanib.  Overall she feels stable.  She continues on 3-4 L of oxygen.  Nevertheless she continues to complain about recurrent sinus or bronchitis issues.  Review of the labs indicate that she has had eosinophilia in 2018.  No blood allergy work-up although mold hypersensitive pneumonitis panel has been negative.  No prior IgE evaluation.  She says that she got an infection in December 2023 and since then she is having recurrent sinusitis or bronchitis.  Currently with the pollen levels she feels she is having a sinusitis.  In March 2023 I gave her amoxicillin and prednisone.  She wants the same again.  In mid May 2023 she is going to visit her uncle and also go for a wedding in [place] in the [place] and she will be back in early June 2023.  Her son Simona Huh is going to take  her.  She wants to make sure she gets an antibiotic and prednisone before the trip as well because she wants to feel good for the trip.  Her most recent pulmonary function test was in March 2023.  Her symptom score is detailed below recently stable but over time it is worse.        HRCT Jan 2023  Narrative & Impression  CLINICAL DATA:  Interstitial lung disease.   EXAM: CT CHEST WITHOUT CONTRAST   TECHNIQUE: Multidetector CT imaging of the chest was performed following the standard protocol without intravenous contrast. High resolution imaging of the lungs, as well as inspiratory and expiratory imaging, was performed.   RADIATION DOSE REDUCTION: This exam was performed according to the departmental dose-optimization program which includes automated exposure control, adjustment of the mA and/or kV according to patient size and/or use of iterative reconstruction technique.   COMPARISON:  09/29/2020, 10/24/2019 and 04/09/2018.   FINDINGS: Cardiovascular: Atherosclerotic calcification of the aorta, aortic valve and coronary arteries. Pulmonic trunk and heart are enlarged. No pericardial effusion.   Mediastinum/Nodes: Low-attenuation right thyroid nodule measures approximately 2.0 cm. Streak artifact from a right shoulder arthroplasty degrades image quality in this area. No pathologically enlarged mediastinal or axillary lymph nodes. Hilar regions are difficult to evaluate without IV contrast. Esophagus is grossly unremarkable.   Lungs/Pleura: Traction bronchiectasis/bronchiolectasis, architectural distortion, subpleural reticular densities and ground-glass, similar to prior exams. No definite zonal predominance. Biopsy suture line in the inferior aspect of the anterior segment right upper lobe. No pleural fluid. Airway is unremarkable. There is air trapping.   Upper Abdomen: Visualized portion of the liver is unremarkable. Stones in the gallbladder. Visualized portions of  the adrenal glands, kidneys, spleen, pancreas, stomach  and bowel are unremarkable with the exception of a small hiatal hernia. No upper abdominal adenopathy.   Musculoskeletal: Degenerative changes in the spine. Right shoulder arthroplasty. No worrisome lytic or sclerotic lesions.   IMPRESSION: 1. 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 Findings are suggestive of an alternative diagnosis (not UIP) per consensus guidelines: Diagnosis of Idiopathic Pulmonary Fibrosis: An Official ATS/ERS/JRS/ALAT Clinical Practice Guideline. Am Rosezetta Schlatter Crit Care Med Vol 198, Iss 5, [phone], Feb 04 2017. 2. Cholelithiasis. 3. 2.0 cm low-attenuation right thyroid nodule. Recommend thyroid ultrasound. (Ref: J Am Coll Radiol. 2015 Feb;12(2): 143-50). 4. Aortic atherosclerosis (ICD10-I70.0). Coronary artery calcification. 5. Enlarged pulmonic trunk, indicative of pulmonary arterial hypertension.     Electronically Signed   By: Leanna Battles M.D.   On: 06/23/2021 10:44         OV 11/25/2021  Subjective:  Patient ID: [name], female , DOB: [birth year] , age 27 y.o. , MRN: [id number] , ADDRESS: [address] [place] [address] PCP Daisy Floro, MD Patient Care Team: Daisy Floro, MD as PCP - General (Family Medicine) Laurey Morale, MD as PCP - Advanced Heart Failure (Cardiology) Aida Puffer, MD as Referring Physician (Family Medicine) Loreli Slot, MD as Consulting Physician (Cardiothoracic Surgery)  This Provider for this visit: Treatment Team:  Attending Provider: Kalman Shan, MD  11/25/2021 -   Chief Complaint  Patient presents with   Follow-up    PFT performed today.  Pt states she has been doing okay since last visit. States she has had congestion and has not felt that great since last visit.     Joy [name] 82  y.o. -presents for follow-up with her son Maurine Minister.  Overall she is stable.  She continues to use 3.5 L at rest and 5-6 L with exertion.  She states this is stable.  She continues to lose weight.  It is unintentional.  She is on nintedanib.  She says her primary care has ruled out other causes.  At this point in time she is still technically overweight so she is content losing weight.  Overall there is no appetite.  She still frustrated by the recurrent acute sinusitis.  She gets exposed to sick grandkids she feels that she is having sinus congestion right now with blockage in the last few weeks and intermittent white-yellow drainage.  She does not Nettie pot she has occasional wheezing.  Her blood eosinophils are quite elevated at 5 8 cells per cubic millimeter.  RAST allergy panel is positive.  She wants to do another course of antibiotic but she does not want to do prednisone because of intolerance.  After her blood results came back I advised Fasenra.  But at that time I did not realize she was not on inhaled corticosteroids.  Anyway she prefers to do inhaler first approach.  From a dyspnea standpoint she is stable.  She had pulmonary function test that is now stable for 1 year but is progressive over many years.  She prefers to continue her nintedanib at this point.  She does not want to do oral prednisone     CT Chest data    OV 02/22/2022  Subjective:  Patient ID: [name], female , DOB: 03/08/1941 , age 35 y.o. , MRN: [id number] , ADDRESS: [address] [place] [address] PCP Daisy Floro, MD Patient Care Team: Daisy Floro, MD as PCP -  General (Family Medicine) Laurey Morale, MD as PCP - Advanced Heart Failure (Cardiology) Aida Puffer, MD as Referring Physician (Family Medicine) Loreli Slot, MD as Consulting Physician (Cardiothoracic Surgery)  This Provider for this visit: Treatment Team:  Attending Provider: Kalman Shan,  MD    02/22/2022 -   Chief Complaint  Patient presents with   Follow-up    No c/o dry cough      Joy [name] 82 y.o. -returns for follow-up.  Presents with her son Maurine Minister.  Overall stable still continuing on 4 L nasal cannula but 3 weeks ago started having another bronchitic episode along with conjunctivitis symptoms or rhinitis.  A lot of cough and yellow mucus.  Slowly getting back to baseline as usual did not do prednisone.  She is tolerating her nintedanib well.  She continues on 4 L oxygen.  She keeps getting these recurrent bronchitic symptoms.  She has positive blood use and feels an elevated RAST allergy testing.  She wants another antibiotic to keep it handy.  She has not had a respiratory vaccines.  We will clear her biologic therapy particularly Harrington Challenger she is afraid of needles.  After much back-and-forth decision.  Her son is a Teacher, early years/pre she has agreed to try [place].  Also of note she was able to get an echocardiogram but she states our office never scheduled it.  Of note last time I gave her Symbicort because of asthma symptoms but she is not taking it.  She believes she did not tolerate it.  CT Chest data  No results found.    OV 05/17/2022  Subjective:  Patient ID: [name], female , DOB: [birth year] , age 78 y.o. , MRN: [id number] , ADDRESS: [address] [place] [address] PCP Daisy Floro, MD Patient Care Team: Daisy Floro, MD as PCP - General (Family Medicine) Laurey Morale, MD as PCP - Advanced Heart Failure (Cardiology) Aida Puffer, MD as Referring Physician (Family Medicine) Loreli Slot, MD as Consulting Physician (Cardiothoracic Surgery)  This Provider for this visit: Treatment Team:  Attending Provider: Kalman Shan, MD   05/17/2022 -   Chief Complaint  Patient presents with   Follow-up    Pt has been on an abx which she recently finished and states she does not feel like she is over a  respiratory virus she picked up. States that her breathing is about the same. Does have complaints of a cough.     Joy [name] 82 y.o. -Presents with her son Maurine Minister.  Since her last visit she called in again for for antibiotic.  We gave this to her.  But also felt she needed prednisone.  She reluctantly took prednisone and this actually did help but now that the to have weaned off she is getting symptomatic again with cough and yellow sputum and worsening shortness of breath and respiratory exacerbation and sinus congestion.  However's overall respiratory status is stable on 4 L oxygen.  She is consistently had high eosinophils peripherally.  Last time I recommended Harrington Challenger but she declined.  This time she is more open to taking it after much conversation.  She is again requesting another course of antibiotics.  She feels the 5 days of amoxicillin was not enough in September 2023.  She think she needs a 10-day course.  She will take a prednisone handy and will use it with the end of the amoxicillin taper when it is closer to Christmas time when  the family is altogether.  She wants to feel better for Christmas.    OV 09/26/2022  Subjective:  Patient ID: [name], female , DOB: [birth year] , age 57 y.o. 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 Interestingly, there are scattered non-necrotizing granulomata in the pleura.  -Second opinion September 2016 at [facility]: pathology was reviewed at our multidisciplinary ILD conference and noted to have a variegated pattern of fibrosis with some airway centricity, granulomas noted both near the airway and more peripherally. Minimal inflammation was noted. Features not c/w with UIP. Few giant cells with cholesterol clefts. Overall pattern most in keeping with chronic HP   - 3rd opinon: June 2021 [facility] ILD [place] - Hypersensitivity Pneumonitis   -High-resolution CT chest including April 09, 2018: Lack of craniocaudal gradient and significant air trapping suggestive of chronic hypersensitivity pneumonitis [September 2016 [facility] evaluation thought CT was inconsistent with UIP]  - May 2021: stability since 2019 but progression sice 2016  -Clinical diagnosis of chronic hypersensitivity pneumonitis   - [facility]  -No clear-cut exposure identified at [facility] evaluation September 2016 by Dr. Ileene Hutchinson  -Steroids recommended but patient preferred not to take it September 2016 at [facility]   -Positive RAST allergy panel  (grass, dustmike) and normal IgE but elevated  blood eosinophilia in April 2023  - 500cell April 2023  - 400ccells Sept 2023   - Started Symbicort June 2023  -Started Harrington Challenger January 2024.  -Normal right heart catheterization May 2022  -Echo January 2024 with evidence of pulmonary hypertension: Patient held off on right heart catheterization early 2024.  -Unintentional weight loss noted in 2023  = Presumed secondary to nintedanib  -Treatment   - deferred steroids sept 2016 visit at [facility] with Dr Ileene Hutchinson and again at [facility] in June 2021, Apr 2022  - nintedanib sole Rx since March 2020  -Inhaled nitric oxide versus placebo research protocol: Withdrawal consent and  follow-up March 2023  (study results launched in late spring 2023 and negative]   09/26/2022 -   Chief Complaint  Patient presents with   Follow-up    F/up     Joy [name] 82 y.o. -presents with her son Maurine Minister.  I apologize for running late today.  She understands tells me that she has now had 3 injections of Fasenra.  The fourth 1 is due next week.  It appears the Harrington Challenger is significantly helping her.  She has tolerated the allergy season quite well.  The third dose was on 08/09/2022 and second dose was on 07/12/2022.  They are reporting less cough less wheezing less sinus congestion less shortness of breath and less antibiotic need and improved tolerance of the allergy season.  However it appears that her joint pains are worse particularly in the shoulder and the  right knee.  She does not have chronic arthritis.  She feels that the Harrington Challenger is increasing her joint pain because the joint pains got worse after she started the [place] but she does admit that her respiratory symptoms are better.  She and her son and I took a shared decision making for [place] to continue the [place] on her for the time being.  She is okay with this plan.  Oxygen use is unchanged Echo January 2024 showed elevated pulmonary pressures but she wanted to hold off on right heart cath Tolerating antifibrotic well.       SYMPTOM SCALE - ILD 07/29/2019  10/03/2019  12/03/2019 217# 03/09/2020  09/24/2020 207# 08/25/2021 - withdrew from iNP sutdy Weight 201# 09/28/2021 - 202#  11/25/2021 - 193#  02/22/2022 194# 05/17/2022   O2 use 3L at rest, occ 4L with exertion 3L at rest and 4L with exertion. Acute bronchitis 2 weeks ago 3L rest, 4 L exertion 3-4L [place] of[place]. Doing rehab 3/4L [place] pulse . On ofev 3L rest, 4L with eretiion. Weight 201# Treated oxygen with rest and 4 L exertion and pulse 3.5L rest, 4-5L exertion 4L Max  4L  Shortness of Breath 0 -> 5 scale with 5 being worst (score 6 If unable to do)           At rest  0 1 0 0 0 1 0 1 1 0   Simple tasks - showers, clothes change, eating, shaving 1 2 2 2 1 3 2 2 3 1   Household (dishes, doing bed, laundry) 2 3 2 2  2.5 3 4 3 4 3   Shopping 2 2 2 2  2.5 2 3 2 3 2   Walking level at own pace 1 2 2 2 2 2 3 3 3 3   Walking up Stairs 6 4 4 6 5 6 6 5 6 5   Total (30-36) Dyspnea Score 12 14 12 14 13 17 18 16 20 14   How bad is your cough? 1 3.5  More often lateley 2 3 4 2 3 3   How bad is your fatigue 0 3.5 2 Better with exercise 2.5 3 4 3 3 3   How bad is nausea 0 0 0 0 0 0 0 0 0 0   How bad is vomiting?  0 0 0 0 0 00 0 0 0 0   How bad is diarrhea? 0 0 0 0 0 0 0 0 0 0   How bad is anxiety? 1 1.5 1 1 1  00 1 1 2 1   How bad is depression 1 1 1 1  0 0 1 1 2 1     Simple office walk 185 feet x  3 laps goal with forehead probe 09/24/2020  09/28/2021   O2 used ra (baseline uses 3-4L at home) ra  Number laps completed End of 1 lap out of intented 3 Stoppsed at 3/4 of 1st lap  Comments about pace slow avg with walker  Resting Pulse Ox/HR 94% and 87/min 95% and HR 83  Final Pulse Ox/HR 85% and 113/min 82% and HR 89  Desaturated </= 88% yes   Desaturated <= 3% points yes   Got Tachycardic >/= 90/min yes   Symptoms at end of test Mod dyspnea   Miscellaneous comments desat at end iof 1 lap. STable on room air       PFT     Latest Ref Rng & Units 09/23/2022   12:14 PM 11/25/2021    3:10 PM 09/24/2020    2:56 PM 12/03/2019  11:39 AM 12/03/2019    9:57 AM 07/19/2019    3:54 PM 05/01/2017    2:05 PM  PFT Results  FVC-Pre L 1.46  P 1.58  1.52  1.70  1.66  1.61  1.71   FVC-Predicted Pre % 52  P 54  50  57  55  53  55   FVC-Post L      1.87  1.72   FVC-Predicted Post %      61  55   Pre FEV1/FVC % % 82  P 79  80  81  82  81  87   Post FEV1/FCV % %      78  90   FEV1-Pre L 1.20  P 1.25  1.21  1.39  1.36  1.31  1.50   FEV1-Predicted Pre % 57  P 57  54  62  60  57  64   FEV1-Post L      1.46  1.56   DLCO uncorrected ml/min/mmHg 11.24  P 11.86  5.95  15.25  13.20  17.11  11.23    DLCO UNC% % 56  P 57  28  73  63  82  39   DLCO corrected ml/min/mmHg 11.24  P 11.97  5.95  15.25  13.20   10.63   DLCO COR %Predicted % 56  P 58  28  73  63   37   DLVA Predicted % 88  P 94  67  114  103  120  70   TLC L      3.63  3.37   TLC % Predicted %      65  61   RV % Predicted %      73  63     P Preliminary result     Latest Reference Range & Units 02/06/07 10:43 04/19/17 16:17 09/28/21 12:19 02/22/22 15:55 05/17/22 12:02  Eosinophils Absolute 0.0 - 0.7 K/uL 0.4 0.7 0.5 0.4 0.5     has a past medical history of Allergic rhinitis, Anxiety, Arthritis, Asthma, Carpal tunnel syndrome, CHF (congestive heart failure), GERD (gastroesophageal reflux disease), History of bronchitis (3-79yrs ago), History of colon polyps, Joint pain, Peripheral edema, Pneumonitis, 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   reports that she has never smoked. She has never used smokeless tobacco.  Past Surgical History:  Procedure Laterality Date   BREAST CYST EXCISION Left 50 yrs ago   CARPELL TUNNELL SURG Right    COLONOSCOPY     ESOPHAGOGASTRODUODENOSCOPY     EYE SURGERY     bilateral cataract surgery with lens implants   KNEE ARTHROSCOPY W/ MENISCAL REPAIR     left   LUNG BIOPSY Right 11/20/2014   Procedure: LUNG BIOPSY;  Surgeon: Loreli Slot, MD;  Location: [facility] OR;  Service: Thoracic;  Laterality: Right;   REVERSE SHOULDER ARTHROPLASTY Right 06/07/2018   Procedure: REVERSE SHOULDER ARTHROPLASTY;  Surgeon: Francena Hanly, MD;  Location: [facility] ORS;  Service: Orthopedics;  Laterality: Right;    RIGHT/LEFT HEART CATH AND CORONARY ANGIOGRAPHY N/A 10/14/2020   Procedure: RIGHT/LEFT HEART CATH AND CORONARY ANGIOGRAPHY;  Surgeon: Laurey Morale, MD;  [facility] INVASIVE CV LAB;  Service: Cardiovascular;  Laterality: N/A;   TOTAL KNEE ARTHROPLASTY Left 08/13/2013   Procedure: LEFT TOTAL KNEE ARTHROPLASTY;  Surgeon:  Shelda Pal, MD;  Location: [facility] ORS;  Service: Orthopedics;  Laterality: Left;   UMBILICAL HERNIA REPAIR  VIDEO ASSISTED THORACOSCOPY Right 11/20/2014   Procedure: RIGHT  VIDEO ASSISTED THORACOSCOPY WITH WEDGE LUNG BIOPSIES RIGHT UPPER,MIDDLE AND LOWER LOBES ,PLACEMENT OF ON Q PAIN PUMP;  Surgeon: Loreli Slot, MD;  Location: [facility] OR;  Service: Thoracic;  Laterality: Right;    Allergies  Allergen Reactions   Adhesive [Tape] Itching and Rash   Latex Itching and Swelling   Doxycycline Rash   Sulfonamide Derivatives Other (See Comments)    Unknown    Immunization History  Administered Date(s) Administered   Fluad Quad(high Dose 65+) 02/07/2019, 05/06/2021, 02/22/2022   Influenza Split 02/28/2012, 03/02/2015, 04/20/2016, 02/12/2018   Influenza, High Dose Seasonal PF 04/20/2016, 02/23/2017, 02/12/2018   Influenza,inj,quad, With Preservative 04/14/2014   Influenza-Unspecified 03/06/2014, 02/18/2015   PFIZER(Purple Top)SARS-COV-2 Vaccination 07/12/2019, 08/06/2019, 04/12/2020   Pneumococcal Conjugate-13 04/14/2014   Pneumococcal Polysaccharide-23 06/14/2017   Tdap 06/14/2017   Zoster, Live 04/24/2013    Family History  Problem Relation Age of Onset   Tuberculosis Father    Tuberculosis Paternal Uncle      Current Outpatient Medications:    amoxicillin (AMOXIL) 500 MG capsule, Take 1 capsule (500 mg total) by mouth 3 (three) times daily., Disp: 30 capsule, Rfl: 0   aspirin 325 MG tablet, Take 650 mg by mouth daily as needed for moderate pain or headache., Disp: , Rfl:    Benralizumab (FASENRA) 30 MG/ML SOSY, Inject into the skin. Inject 30mg  into the skin at Week 0, Week 4, Week 8 then every 8 weeks (receives at [employer] [facility] [employer]), Disp: , Rfl:    Cholecalciferol (DIALYVITE VITAMIN D 5000) 125 MCG (5000 UT) capsule, Take 5,000 Units by mouth daily., Disp: , Rfl:    fluticasone (FLONASE) 50 MCG/ACT nasal spray, Place 1 spray into both nostrils daily as needed for  allergies. , Disp: , Rfl:    furosemide (LASIX) 20 MG tablet, as needed., Disp: , Rfl:    ibuprofen (ADVIL) 200 MG tablet, Take 200-400 mg by mouth every 6 (six) hours as needed for headache or moderate pain., Disp: , Rfl:    loratadine (CLARITIN) 5 MG chewable tablet, Chew 5-10 mg by mouth daily as needed for allergies. , Disp: , Rfl:    Multiple Vitamin (MULTIVITAMIN WITH MINERALS) TABS tablet, Take 1 tablet by mouth daily., Disp: , Rfl:    Nintedanib (OFEV) 150 MG CAPS, Take 1 capsule (150 mg total) by mouth 2 (two) times daily., Disp: 60 capsule, Rfl: 4   Respiratory Therapy Supplies (FLUTTER) DEVI, Use as directed., Disp: 1 each, Rfl: 0   sodium chloride HYPERTONIC 3 % nebulizer solution, Take by nebulization in the morning and at bedtime., Disp: 750 mL, Rfl: 1   VENTOLIN HFA 108 (90 Base) MCG/ACT inhaler, INHALE 2 PUFFS INTO THE LUNGS EVERY 6 HOURS AS NEEDED FOR WHEEZING OR SHORTNESS OF BREATH, Disp: 18 g, Rfl: 6   vitamin B-12 (CYANOCOBALAMIN) 1000 MCG tablet, Take 1,000 mcg by mouth every other day., Disp: , Rfl:       Objective:   Vitals:   09/26/22 1623  BP: 110/70  Pulse: 64  SpO2: 96%  Weight: 190 lb (86.2 kg)  Height: 5\' 7"  (1.702 m)    Estimated body mass index is 29.76 kg/m as calculated from the following:   Height as of this encounter: 5\' 7"  (1.702 m).   Weight as of this encounter: 190 lb (86.2 kg).  @WEIGHTCHANGE @  [employer]   09/26/22 1623  Weight: 190 lb (86.2 kg)  Physical Exam   [place]  General: No distress. Lost weight Neuro: Alert and Oriented x 3. GCS 15. Speech normal Psych: Pleasant Resp:  Barrel Chest - no.  Wheeze - only rUL and improved, Crackles - bilateral UL, No overt respiratory distress CVS: Normal heart sounds. Murmurs - no Ext: Stigmata of Connective Tissue Disease - no HEENT: Normal upper airway. PEERL +. No post nasal drip        Assessment:       ICD-10-CM   1. ILD (interstitial lung disease)  J84.9 Pulmonary function  test    CBC w/Diff    Hepatic function panel    Hepatic function panel    CBC w/Diff    2. Hypersensitivity pneumonitis  J67.9     3. Eosinophilia, unspecified type  D72.10     4. 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   - PFT wise stable from summer 2022 through June 2023 (though progressive since 2018 and 2021) and likely stable April 2024 - echo sept 2023 suggests pulmonary hypertension  Plan  -  continue ofev   - check safety labs 09/26/2022 for ofev = check bnp blood work 09/26/2022  - continue o2 3-4LNC at rest and more with exertion - goal pulse ox > 88% - lab work as below  - like to discuss issue of cocnern for pulmonary hypertension at next visit - do spirometry and dlco in 3-4 months    History of recurrent bronchitis with acute bronchitis /asthma flare up - 02/22/2022, Oct 2023, Nov 2023, Dec 2023 History of eosinophilia 2018 and 2023 with postiive RAST panel e-asthma , allergic asthma with flare up  Seems you handled allergy season beetter with fasenra and overall upper airwawy symptoms and respiratory symptoms are better on fasenra  Harrington Challenger might be causing increased joint pain though it is not a known side effect   Plan (share decision making) - check cbc with diff, bnp, LFT,  09/26/2022 - Continue  fasenra due to recurrent flare up  - counseled extensively   - moinitor your joint pain - do spirometry and dlco in 3-4 months   Followup  - Goran Olden visit - 30 min in 3-4 months  - symptoms score and walk test at followup    SIGNATURE    Dr. Kalman Shan, M.D., F.C.C.P,  Pulmonary and Critical Care Medicine Staff Physician, [facility] Director - Interstitial Lung Disease  Program  Pulmonary Fibrosis [facility] at [facility] [place], [place],  [address]  Pager: [phone], If no answer or between  15:00h - 7:00h: call [phone] Telephone: [phone]  5:09 PM 09/26/2022

## 2022-09-27 LAB — CBC WITH DIFFERENTIAL/PLATELET
Basophils Absolute: 0 10*3/uL (ref 0.0–0.1)
Basophils Relative: 0.4 % (ref 0.0–3.0)
Eosinophils Absolute: 0 10*3/uL (ref 0.0–0.7)
Eosinophils Relative: 0.1 % (ref 0.0–5.0)
HCT: 42.9 % (ref 36.0–46.0)
Hemoglobin: 13.8 g/dL (ref 12.0–15.0)
Lymphocytes Relative: 31.2 % (ref 12.0–46.0)
Lymphs Abs: 3.4 10*3/uL (ref 0.7–4.0)
MCHC: 32.3 g/dL (ref 30.0–36.0)
MCV: 92.2 fl (ref 78.0–100.0)
Monocytes Absolute: 1.2 10*3/uL — ABNORMAL HIGH (ref 0.1–1.0)
Monocytes Relative: 11.3 % (ref 3.0–12.0)
Neutro Abs: 6.2 10*3/uL (ref 1.4–7.7)
Neutrophils Relative %: 57 % (ref 43.0–77.0)
Platelets: 334 10*3/uL (ref 150.0–400.0)
RBC: 4.65 Mil/uL (ref 3.87–5.11)
RDW: 14.6 % (ref 11.5–15.5)
WBC: 10.9 10*3/uL — ABNORMAL HIGH (ref 4.0–10.5)

## 2022-09-27 LAB — HEPATIC FUNCTION PANEL
ALT: 9 U/L (ref 0–35)
AST: 18 U/L (ref 0–37)
Albumin: 3.6 g/dL (ref 3.5–5.2)
Alkaline Phosphatase: 75 U/L (ref 39–117)
Bilirubin, Direct: 0.1 mg/dL (ref 0.0–0.3)
Total Bilirubin: 0.3 mg/dL (ref 0.2–1.2)
Total Protein: 6.7 g/dL (ref 6.0–8.3)

## 2022-09-27 LAB — BRAIN NATRIURETIC PEPTIDE: Pro B Natriuretic peptide (BNP): 36 pg/mL (ref 0.0–100.0)

## 2022-10-04 ENCOUNTER — Ambulatory Visit (INDEPENDENT_AMBULATORY_CARE_PROVIDER_SITE_OTHER): Payer: Medicare Other | Admitting: *Deleted

## 2022-10-04 VITALS — BP 162/81 | HR 84 | Temp 97.6°F | Resp 16 | Ht 66.0 in | Wt 190.6 lb

## 2022-10-04 DIAGNOSIS — J4550 Severe persistent asthma, uncomplicated: Secondary | ICD-10-CM

## 2022-10-04 MED ORDER — BENRALIZUMAB 30 MG/ML ~~LOC~~ SOSY
30.0000 mg | PREFILLED_SYRINGE | Freq: Once | SUBCUTANEOUS | Status: AC
  Administered 2022-10-04: 30 mg via SUBCUTANEOUS
  Filled 2022-10-04: qty 1

## 2022-10-04 NOTE — Progress Notes (Signed)
Diagnosis: Asthma  Provider:  Praveen Mannam MD  Procedure: Injection  Fasenra (Benralizumab), Dose: 30 mg, Site: subcutaneous, Number of injections: 1  Post Care: Observation period completed  Discharge: Condition: Good, Destination: Home . AVS Provided  Performed by:  Wynton Hufstetler A, RN       

## 2022-11-09 DIAGNOSIS — I2510 Atherosclerotic heart disease of native coronary artery without angina pectoris: Secondary | ICD-10-CM | POA: Diagnosis not present

## 2022-11-09 DIAGNOSIS — J8410 Pulmonary fibrosis, unspecified: Secondary | ICD-10-CM | POA: Diagnosis not present

## 2022-11-09 DIAGNOSIS — Z6830 Body mass index (BMI) 30.0-30.9, adult: Secondary | ICD-10-CM | POA: Diagnosis not present

## 2022-11-09 DIAGNOSIS — M1711 Unilateral primary osteoarthritis, right knee: Secondary | ICD-10-CM | POA: Diagnosis not present

## 2022-11-09 DIAGNOSIS — R609 Edema, unspecified: Secondary | ICD-10-CM | POA: Diagnosis not present

## 2022-11-09 DIAGNOSIS — R531 Weakness: Secondary | ICD-10-CM | POA: Diagnosis not present

## 2022-11-24 DIAGNOSIS — M1711 Unilateral primary osteoarthritis, right knee: Secondary | ICD-10-CM | POA: Diagnosis not present

## 2022-11-24 DIAGNOSIS — M25561 Pain in right knee: Secondary | ICD-10-CM | POA: Diagnosis not present

## 2022-11-29 ENCOUNTER — Ambulatory Visit (INDEPENDENT_AMBULATORY_CARE_PROVIDER_SITE_OTHER): Payer: Medicare Other

## 2022-11-29 ENCOUNTER — Telehealth: Payer: Self-pay | Admitting: Internal Medicine

## 2022-11-29 VITALS — BP 170/83 | HR 90 | Temp 98.0°F | Resp 22 | Ht 67.0 in | Wt 188.0 lb

## 2022-11-29 DIAGNOSIS — J4550 Severe persistent asthma, uncomplicated: Secondary | ICD-10-CM | POA: Diagnosis not present

## 2022-11-29 MED ORDER — BENRALIZUMAB 30 MG/ML ~~LOC~~ SOSY
30.0000 mg | PREFILLED_SYRINGE | Freq: Once | SUBCUTANEOUS | Status: AC
  Administered 2022-11-29: 30 mg via SUBCUTANEOUS
  Filled 2022-11-29: qty 1

## 2022-11-29 NOTE — Progress Notes (Signed)
Diagnosis: Asthma  Provider:  Mannam, Praveen MD  Procedure: Injection  Fasenra (Benralizumab), Dose: 30 mg, Site: subcutaneous, Number of injections: 1  Post Care:     Discharge: Condition: Good, Destination: Home . AVS Provided  Performed by:  Rhian Funari, RN       

## 2022-11-29 NOTE — Telephone Encounter (Signed)
Calling to check on Prior Auth Cover My Meds Confirmation [facility])

## 2022-11-29 NOTE — Patient Instructions (Signed)
Benralizumab Injection What is this medication? BENRALIZUMAB (BEN ra LIZ oo mab) prevents the symptoms of asthma. It is prescribed when other asthma medications have not worked well enough. It works by decreasing inflammation of the airways, making it easier to breathe. Do not use it to treat a sudden asthma attack. It is a monoclonal antibody. This medicine may be used for other purposes; ask your health care provider or pharmacist if you have questions. COMMON BRAND NAME(S): Harrington Challenger What should I tell my care team before I take this medication? They need to know if you have any of these conditions: Parasitic (helminth) infection An unusual or allergic reaction to benralizumab, hamster proteins, other medications, foods, dyes, or preservatives Pregnant or trying to get pregnant Breast-feeding How should I use this medication? This medication is injected under the skin. You will be taught how to prepare and give it. Take it as directed on the prescription label. Keep taking it unless your care team tells you to stop. If you use a pen, be sure to take off the outer needle cover before using the dose. It is important that you put your used needles and syringes in a special sharps container. Do not put them in a trash can. If you do not have a sharps container, call your pharmacist or care team to get one. A patient package insert for the product will be given with each prescription and refill. Be sure to read this information carefully each time. The sheet may change often. This medication comes with INSTRUCTIONS FOR USE. Ask your pharmacist for directions on how to use this medication. Read the information carefully. Talk to your pharmacist or care team if you have questions. Talk to your care team about the use of this medication in children. While this medication may be prescribed for children as young as 12 years for selected conditions, precautions do apply. Overdosage: If you think you have  taken too much of this medicine contact a poison control center or emergency room at once. NOTE: This medicine is only for you. Do not share this medicine with others. What if I miss a dose? It is important not to miss your dose. Call your care team if you are unable to keep an appointment. If you give yourself this medication at home, and you miss a dose, take it as soon as you can. If it is almost time for your next dose, take only that dose. Do not take double or extra doses. Call your care team with questions. What may interact with this medication? Interactions are not expected. This list may not describe all possible interactions. Give your health care provider a list of all the medicines, herbs, non-prescription drugs, or dietary supplements you use. Also tell them if you smoke, drink alcohol, or use illegal drugs. Some items may interact with your medicine. What should I watch for while using this medication? Visit your care team for regular checks on your progress. NEVER use this medication for an acute asthma attack. Tell your care team if your symptoms do not start to get better or if they get worse. Do not stop taking your other asthma medications unless instructed to do so by your care team. What side effects may I notice from receiving this medication? Side effects that you should report to your care team as soon as possible: Allergic reactions or angioedema--skin rash, itching or hives, swelling of the face, eyes, lips, tongue, arms, or legs, trouble swallowing or breathing Side effects that  usually do not require medical attention (report these to your care team if they continue or are bothersome): Headache Sore throat This list may not describe all possible side effects. Call your doctor for medical advice about side effects. You may report side effects to FDA at 1-800-FDA-1088. Where should I keep my medication? Keep out of the reach of children and pets. Store in a refrigerator  or at room temperature between 20 and 25 degrees C (68 and 77 degrees F). Refrigeration (preferred): Store in the refrigerator. Protect from light. Keep this drug in the original container until you are ready to take it. Throw away any unused drug after the expiration date. Room Temperature: This drug may be stored at room temperature for up to 14 days. Keep this drug in the original container until you are ready to take it. If it is stored at room temperature, throw away any unused drug after 14 days or after it expires, whichever is first. To get rid of medications that are no longer needed or have expired: Take the medication to a medication take-back program. Check with your pharmacy or law enforcement to find a location. If you cannot return the medication, check with the label to see if the medication should be thrown in the garbage. If you are not sure, ask your care team. If it is safe to put in the trash, empty the medication out of the container. Mix the medication with cat litter, dirt, coffee grounds, or other unwanted substance. Seal the mixture in a bag or container. Put in the trash. NOTE: This sheet is a summary. It may not cover all possible information. If you have questions about this medicine, talk to your doctor, pharmacist, or health care provider.  2024 Elsevier/Gold Standard (2021-04-02 00:00:00)

## 2022-11-30 DIAGNOSIS — M25561 Pain in right knee: Secondary | ICD-10-CM | POA: Diagnosis not present

## 2022-11-30 DIAGNOSIS — M1711 Unilateral primary osteoarthritis, right knee: Secondary | ICD-10-CM | POA: Diagnosis not present

## 2022-11-30 NOTE — Telephone Encounter (Signed)
Submitted a Prior Authorization request to [facility] for OFEV via CoverMyMeds. Will update once we receive a response.  Key: [name]

## 2022-12-01 NOTE — Telephone Encounter (Signed)
Received notification from [facility] regarding a prior authorization for OFEV. Authorization has been APPROVED from 11/30/22 to 11/30/23. Approval letter sent to scan center.  Patient can continue to fill through [facility]: [phone]  Authorization # [id number] Phone # [phone]  Chesley Mires, PharmD, MPH, BCPS, CPP Clinical Pharmacist (Rheumatology and Pulmonology)

## 2022-12-09 DIAGNOSIS — M1711 Unilateral primary osteoarthritis, right knee: Secondary | ICD-10-CM | POA: Diagnosis not present

## 2022-12-09 DIAGNOSIS — M25561 Pain in right knee: Secondary | ICD-10-CM | POA: Diagnosis not present

## 2022-12-12 DIAGNOSIS — M25561 Pain in right knee: Secondary | ICD-10-CM | POA: Diagnosis not present

## 2022-12-12 DIAGNOSIS — M1711 Unilateral primary osteoarthritis, right knee: Secondary | ICD-10-CM | POA: Diagnosis not present

## 2022-12-19 DIAGNOSIS — M1711 Unilateral primary osteoarthritis, right knee: Secondary | ICD-10-CM | POA: Diagnosis not present

## 2022-12-19 DIAGNOSIS — M25561 Pain in right knee: Secondary | ICD-10-CM | POA: Diagnosis not present

## 2022-12-21 DIAGNOSIS — M25561 Pain in right knee: Secondary | ICD-10-CM | POA: Diagnosis not present

## 2022-12-21 DIAGNOSIS — M1711 Unilateral primary osteoarthritis, right knee: Secondary | ICD-10-CM | POA: Diagnosis not present

## 2022-12-26 DIAGNOSIS — M25561 Pain in right knee: Secondary | ICD-10-CM | POA: Diagnosis not present

## 2022-12-26 DIAGNOSIS — M1711 Unilateral primary osteoarthritis, right knee: Secondary | ICD-10-CM | POA: Diagnosis not present

## 2023-01-02 DIAGNOSIS — M1711 Unilateral primary osteoarthritis, right knee: Secondary | ICD-10-CM | POA: Diagnosis not present

## 2023-01-02 DIAGNOSIS — M25561 Pain in right knee: Secondary | ICD-10-CM | POA: Diagnosis not present

## 2023-01-05 ENCOUNTER — Encounter: Payer: Self-pay | Admitting: Internal Medicine

## 2023-01-05 DIAGNOSIS — M1711 Unilateral primary osteoarthritis, right knee: Secondary | ICD-10-CM | POA: Diagnosis not present

## 2023-01-05 DIAGNOSIS — M25561 Pain in right knee: Secondary | ICD-10-CM | POA: Diagnosis not present

## 2023-01-10 DIAGNOSIS — M25561 Pain in right knee: Secondary | ICD-10-CM | POA: Diagnosis not present

## 2023-01-10 DIAGNOSIS — M1711 Unilateral primary osteoarthritis, right knee: Secondary | ICD-10-CM | POA: Diagnosis not present

## 2023-01-12 ENCOUNTER — Encounter: Payer: Self-pay | Admitting: Internal Medicine

## 2023-01-12 ENCOUNTER — Telehealth: Payer: Self-pay | Admitting: Internal Medicine

## 2023-01-12 ENCOUNTER — Ambulatory Visit (INDEPENDENT_AMBULATORY_CARE_PROVIDER_SITE_OTHER): Payer: Medicare Other | Admitting: Internal Medicine

## 2023-01-12 VITALS — BP 140/70 | HR 99 | Ht 67.0 in | Wt 187.0 lb

## 2023-01-12 DIAGNOSIS — J849 Interstitial pulmonary disease, unspecified: Secondary | ICD-10-CM

## 2023-01-12 DIAGNOSIS — J679 Hypersensitivity pneumonitis due to unspecified organic dust: Secondary | ICD-10-CM | POA: Diagnosis not present

## 2023-01-12 DIAGNOSIS — Z5181 Encounter for therapeutic drug level monitoring: Secondary | ICD-10-CM

## 2023-01-12 NOTE — Telephone Encounter (Signed)
Timmothy Sours after patient left today I saw a letter from [employer] that said that she qualifies for HF CW O percussion therapy program for bronchiectasis.  Please note that the bronchiectasis she has is not classic bronchiectasis but ILD related bronchiectasis.  If she is having a lot of sputum typically percussion therapy helps but apparently she told [facility] that she is interested in this.  She is willing to try it.  If so already signed it and you can fax it over to [facility].  Otherwise trash it  Thanks    SIGNATURE    Dr. Kalman Shan, M.D., F.C.C.P,  Pulmonary and Critical Care Medicine Staff Physician, [facility] Director - Interstitial Lung Disease  Program  Pulmonary Fibrosis [facility] at [facility] [place], [place], [address]   Pager: [phone], If no answer  -> Check AMION or Try [phone] Telephone (clinical office): [phone] Telephone (research): [phone]  4:26 PM 01/12/2023

## 2023-01-12 NOTE — Addendum Note (Signed)
Addended by: Hedda Slade on: 01/12/2023 03:25 PM   Modules accepted: Orders

## 2023-01-12 NOTE — Progress Notes (Signed)
xxxxxxxxxxxxxxxxxxxxx  OV 07/29/2019 -transfer of care to Dr. Marchelle Gearing interstitial lung disease center.  Subjective:  Patient ID: [name], female , DOB: 01-09-1941 , age 82 y.o. , MRN: [id number] , ADDRESS: [address] [place] [address]   07/29/2019 -   Chief Complaint  Patient presents with   Follow-up    Pt states her breathing has become worse since last visit. States she also has an occ cough.   #Interstitial lung disease (progressive) with chronic hypoxemic respiratory failure - chronic HP dx -Per [facility] September 2016: Disease present at least since 24-Jan-2007  - Surgical lung biopsy  November 20, 2014 at [place]:   - local opinion: diagnostic of usual interstitial pneumonia (UIP). Interestingly, there are scattered non-necrotizing granulomata in the pleura.  -Second opinion September 2016 at [facility]: pathology was reviewed at our multidisciplinary ILD conference and noted to have a variegated pattern of fibrosis with some airway centricity, granulomas noted both near the airway and more peripherally. Minimal inflammation was noted. Features not c/w with UIP. Few giant cells with cholesterol clefts. Overall pattern most in keeping with chronic HP  -High-resolution CT chest including April 09, 2018: Lack of craniocaudal gradient and significant air trapping suggestive of chronic hypersensitivity pneumonitis [September 2016 [facility] evaluation thought CT was inconsistent with UIP]  -Clinical diagnosis of chronic hypersensitivity pneumonitis   - [facility]  -No clear-cut exposure identified at [facility] evaluation September 2016 by Dr. Ileene Hutchinson  -Steroids recommended but patient preferred not to take it September 2016 at [facility]  -Treatment   - deferred steroids sept 01-24-15 visit at [facility] with Dr Lorella Nimrod  - nintedanib since March 2020   #Small hiatal hernia with acid reflux disease  HPI [name] 82 y.o. -is a  transfer of care from Dr. Kendrick Fries to Dr. Marchelle Gearing with interstitial lung disease questionnaire.  She was last seen by Dr. Kendrick Fries in March 2020.  After the onset of the COVID-19 pandemic Dr. Kendrick Fries is now fully in charge of the Covid hospital [facility].  Therefore he is not in the outpatient medical practice anymore.  Therefore patient has been transferred to the ILD center to Dr. Marchelle Gearing myself.  Is the first time I am meeting her and getting to know her.  She tells me that I took care of her husband for COPD a few years ago.  This was her ex-husband.  He passed away in Jan 24, 2012 after hospitalization to the ICU under our service.  She is grateful for the care.  She tells me that she has interstitial lung disease and for the last year she has been on nintedanib.  Currently she is having difficulty with nintedanib supply.  She only has 2 weeks left.  She uses 3 L of oxygen at rest.  She says she has been doing that for the last 4 years.  It is stable usage.  I am not really sure what her pulse ox on room air would be.  She says occasionally she would crank it up to 4 L for exertion but she generally uses 3 L both at rest and with exertion.  She says she tolerates nintedanib fine. Her interstitial lung disease history was reviewed and summarized above.  It appears that she was not keen on taking prednisone and her 24-Jan-2015 visit with Dr. Ileene Hutchinson at [facility].  Therefore once the [facility] data was available she was started on nintedanib a year  ago.She wants to have a son involved in her visits.  Because of the COVID-19 pandemic he is waiting outside in the car.  She says at the next visit she wants to bring him in.   Her symptom score is listed below.  Her progressive lung function decline is also listed below and the pulmonary function test reviewed.  I personally reviewed and visualized and interpreted those.  Her DLCO is paradoxically higher.  I think this is an error.   ROS - per  HPI  OV 10/03/2019  Subjective:  Patient ID: [name], female , DOB: 1940-12-04 , age 82 y.o. , MRN: [id number] , ADDRESS: [address] 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 Interestingly, there are scattered non-necrotizing granulomata in the pleura.  -Second opinion September 2016 at [facility]: pathology was reviewed at our multidisciplinary ILD conference and noted to have a variegated pattern of fibrosis with some airway centricity, granulomas noted both near the airway and more peripherally. Minimal inflammation was noted. Features not c/w with UIP. Few giant cells with cholesterol clefts. Overall pattern most in keeping with chronic HP  -High-resolution CT chest including April 09, 2018: Lack of craniocaudal gradient and significant air trapping suggestive of chronic hypersensitivity pneumonitis [September 2016 [facility] evaluation thought CT was inconsistent with UIP]  -Clinical diagnosis of chronic hypersensitivity pneumonitis   - dx [facility]  -No clear-cut exposure identified at [facility] evaluation September 2016 by Dr. Ileene Hutchinson  -Steroids recommended but patient preferred not to take it September 2016 at [facility]  -Treatment   - deferred steroids sept 2016 visit at [facility] with Dr Lorella Nimrod  - nintedanib since March 2020   #Small hiatal hernia with acid reflux disease  10/03/2019 -   Chief Complaint  Patient presents with   Follow-up    SOB and cough with activity has increasd, Productive cough with yellow sputum     HPI [name] 82 y.o. -returns for follow-up with her son.  At this point in time the focus is to establish the true nature of interstitial  lung disease.  She has brought her son with her.  However approximately 2 weeks ago she had acute bronchitis symptoms and called in.  Was given a cephalexin 5 days and prednisone 5 days.  These have helped immensely.  However she feels not back to baseline.  She feels she will benefit from another round of antibiotic with or without prednisone.  She finished ILD questionnaire and it is listed below.  We have not been able to discuss in a multidisciplinary case conference of 4. [facility] Comprehensive ILD Questionnaire  Symptoms: See below.   Past Medical History :  -Denies any collagen vascular disease or vasculitis.  Denies any knowledge of prior pulmonary hypertension.  Denies diabetes or thyroid disease.  Denies any stroke.  Denies mononucleosis denies tuberculosis denies pneumonia denies blood clots denies heart disease denies pleurisy   ROS: Positive for shoulder pain for the last several years but otherwise no dry eyes.  No Raynaud's.  No weight loss.  No nausea no vomiting no rash   FAMILY HISTORY of LUNG DISEASE: Denies any pulmonary fibrosis or COPD or cystic fibrosis of hypersensitive pneumonitis  or any other lung disease.   EXPOSURE HISTORY: Never smoked cigarettes.  No passive smoking.  No marijuana.  No vaping no cocaine no intravenous drug use.   HOME and HOBBY DETAILS : Current home is 25 years she is lived there for 5 years.  In the [place] setting there is no dampness.  No mold or mildew no feather pillow.  No steam iron use no Jacuzzi use no nebulizer use no humidifier use.  No pet birds or parakeets.  No pet gerbils no pet hamsters no pet rabbits no rodents.  No mold in the [facility] duct no music habits such as wind instruments no gardening.  No flood of water damage.  No strong mats.  No exposure to animals at work.  History update in April 2023: She reported that between 2010 2014 she lived in the mother's house that was built in the 1950s.  She thought there was mold in  this but never confirmed.  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  She also had a long-term tendency to clean her home heavily with Clorox.  She inhaled these fumes according to the son.  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   PULMONARY TOXICITY HISTORY (27 items): She has done prednisone burst.  She does not want to do long-term prednisone.  She has been on long-term nintedanib but tolerating it well.    Testing -2018 echo with elevated pulmonary systolic pressure 55 mmHg  - nov 2019 Last CT - HRCT IMPRESSION: 1. Spectrum of findings compatible with fibrotic interstitial lung disease with extensive air trapping and absence of basilar gradient, strongly favoring chronic hypersensitivity pneumonitis. Findings have slightly progressed since 04/24/2017, with more clear progression since more remote chest CT of 06/24/2014. Findings are suggestive of an alternative diagnosis (not UIP) per consensus guidelines: Diagnosis of Idiopathic Pulmonary Fibrosis: An Official ATS/ERS/JRS/ALAT Clinical Practice Guideline. Am Rosezetta Schlatter Crit Care Med Vol 198, Iss 5, [phone], Feb 04 2017. 2. Stable mild mediastinal lymphadenopathy, most compatible with benign reactive adenopathy. 3. One vessel coronary atherosclerosis. 4. Small hiatal hernia.   Aortic Atherosclerosis (ICD10-I70.0).     Results for KHRYSTYNA, WILDRICK (MRN [id number]) as of 07/29/2019 11:43  Ref. Range 07/04/2014 12:58 09/03/2015 11:04 09/27/2016 13:56 05/01/2017 14:05 07/19/2019 15:54  FVC-Pre Latest Units: L 2.13 1.93 1.86 1.71 1.61  FVC-%Pred-Pre Latest Units: % 66 61 59 55 53   Results for Joy, Smith (MRN [id number]) as of 07/29/2019 11:43  Ref.  Range 07/04/2014 12:58 09/03/2015 11:04 09/27/2016 13:56 05/01/2017 14:05 07/19/2019 15:54  DLCO unc Latest Units: ml/min/mmHg 13.70 14.70 12.69 11.23 17.11  DLCO unc % pred Latest Units: % 48 51 44 39 82     ROS  OV 12/03/2019  Subjective:  Patient ID: [name], female , DOB: 1940-08-01 , age 9 y.o. 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 Interestingly, there are scattered non-necrotizing granulomata in the pleura.  -Second opinion September 2016 at [facility]: pathology was reviewed at our multidisciplinary ILD conference and noted to have a variegated pattern of fibrosis with some airway centricity, granulomas noted both near the airway and more peripherally. Minimal inflammation was noted. Features not c/w with UIP. Few giant cells with cholesterol clefts. Overall pattern most in keeping with chronic HP   - 3rd opinon: June 2021 [facility] [place] - HP   -High-resolution CT chest including April 09, 2018: Lack of craniocaudal gradient and significant air trapping suggestive of chronic hypersensitivity pneumonitis [September 2016 [facility] evaluation thought CT was inconsistent with UIP]  -Clinical diagnosis of chronic hypersensitivity pneumonitis   - dx [facility]  -No clear-cut exposure identified at [facility] evaluation September 2016 by Dr. Ileene Hutchinson  -Steroids recommended but patient preferred not to take it September 2016 at [facility]  -Treatment   - deferred steroids  sept 2016 visit at [facility] with Dr Lorella Nimrod  - nintedanib since March 2020   #Small hiatal hernia with acid reflux disease   HPI [name] 82 y.o. -presents with her son for follow-up.  She continues to be on 3 L at rest 4 L with exertion.  Her acute bronchitis from April 2021 is resolved.  Her overall symptoms are stable/slightly better after her acute bronchitis.  There is no interim complaints.  In the interim we did discuss her at her case conference.  The consensus diagnosis is that she has hypersensitive pneumonitis.  The only exposure is when she was working in the [employer] at in 1960s.  She and her son again denies any ongoing exposures.  Reviewed her pulmonary function test and it is stable compared to the recent ones.  There is progression over the last few years to several years.  In terms of a high-resolution CT chest is also stable since 2019 through 2021 but progressive when compared to earlier time points.  She was started on nintedanib in March 2020.  She is tolerating this quite well.  It appears that she and she went on nintedanib she is stable.  She yet to do pulmonary rehabilitation.  Last done few years ago.  Son feels that she needs to be more mobile She is yet to connect with the support group She did not get referred to cardiology for right heart catheterization. She wants a flutter valve for her associated bronchiectasis seen on CT scan    OV 03/09/2020   Subjective:  Patient ID: [name], female , DOB: 03-Feb-1941, age 61 y.o. years. , MRN: [id number],  ADDRESS: [address] [place] [address] PCP  Daisy Floro, MD Providers : Treatment Team:  Attending Provider: Kalman Shan, MD   Chief Complaint  Patient presents with   Follow-up    Patient wears 4 liters oxygen with exertion, was prescribed celebrex they want to know if it is ok with OFEV.      #Interstitial lung disease (progressive) with chronic hypoxemic respiratory  failure - chronic HP dx -Per [facility] September 2016: Disease present at least since 2008  - Surgical lung biopsy  November 20, 2014 at [place]:   - local opinion: diagnostic of usual interstitial pneumonia (UIP). Interestingly, there are scattered non-necrotizing  granulomata in the pleura.  -Second opinion September 2016 at [facility]: pathology was reviewed at our multidisciplinary ILD conference and noted to have a variegated pattern of fibrosis with some airway centricity, granulomas noted both near the airway and more peripherally. Minimal inflammation was noted. Features not c/w with UIP. Few giant cells with cholesterol clefts. Overall pattern most in keeping with chronic HP   - 3rd opinon: June 2021 [facility] [place] - Hypersensitivity Pneumonitis   -High-resolution CT chest including April 09, 2018: Lack of craniocaudal gradient and significant air trapping suggestive of chronic hypersensitivity pneumonitis [September 2016 [facility] evaluation thought CT was inconsistent with UIP]  -Clinical diagnosis of chronic hypersensitivity pneumonitis   - [facility]  -No clear-cut exposure identified at [facility] evaluation September 2016 by Dr. Ileene Hutchinson  -Steroids recommended but patient preferred not to take it September 2016 at [facility]  -Treatment   - deferred steroids sept 2016 visit at [facility] with Dr Ileene Hutchinson and again at [facility] in June 2021  - nintedanib sole Rx since March 2020     HPI [name] 82 y.o. -returns for follow-up with her son Maurine Minister.  Since her last visit in June 2020 when she continues to be stable.  She is enrolled in the pulmonary rehabilitation program which has helped her fatigue and shortness of breath significantly.  She continues on nintedanib without any adverse side effects.  She wanted to know if it is okay to take cephalexin for arthralgia.  She has chronic osteoarthritis.  She wants to avoid NSAIDs.  In terms of oxygen  use she is stable at 4 L nasal cannula.  She recently had pulmonary function test I reviewed this and it shows stability and is documented below.  Her weight continues to be stable.  We referred her for right heart catheterization with because of the Covid delta surge she deferred it.  Of note on February 28, 2020 while trying to get into the car out of her primary care physician's office she fell down and sustained abrasions and bruises injuries particularly in her lower extremity.  Since then her right kneecap patella area is red and warm.  There is a small incision injury there but this is healed.  She thinks it slightly better/stable.  She is not having any fever.  This has not been treated.      ROS - per HPI  CT chest high resolution - may 2021  IMPRESSION: 1. Spectrum of findings compatible with fibrotic interstitial lung disease with moderate air trapping and absence of apicobasilar gradient. Findings are most compatible with chronic hypersensitivity pneumonitis. No interval progression since 2019 chest CT. Evidence of progression since baseline 2016 chest CT. Findings are suggestive of an alternative diagnosis (not UIP) per consensus guidelines: Diagnosis of Idiopathic Pulmonary Fibrosis: An Official ATS/ERS/JRS/ALAT Clinical Practice Guideline. Am Rosezetta Schlatter Crit Care Med Vol 198, Iss 5, [phone], Feb 04 2017. 2. Stable mild mediastinal lymphadenopathy, compatible with benign reactive adenopathy. 3. Small hiatal hernia. 4. Aortic Atherosclerosis (ICD10-I70.0).     Electronically Signed   By: Delbert Phenix M.D.   On: 10/24/2019 15:02    OV 09/24/2020  Subjective:  Patient ID: [name], female , DOB: [birth year] , age 27 y.o. , MRN: [id number] , ADDRESS: [address] [place] [address] PCP Daisy Floro, MD Patient Care Team: Daisy Floro, MD as PCP - General (Family Medicine) Aida Puffer, MD as Referring Physician [facility]  Medicine)  Loreli Slot, MD as Consulting Physician (Cardiothoracic Surgery)  This Provider for this visit: Treatment Team:  Attending Provider: Kalman Shan, MD    09/24/2020 -   Chief Complaint  Patient presents with   Follow-up    Review PFT results. Still reports cough, sometimes productive with clear phlegm.      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 Interestingly, there are scattered non-necrotizing granulomata in the pleura.  -Second opinion September 2016 at [facility]: pathology was reviewed at our multidisciplinary ILD conference and noted to have a variegated pattern of fibrosis with some airway centricity, granulomas noted both near the airway and more peripherally. Minimal inflammation was noted. Features not c/w with UIP. Few giant cells with cholesterol clefts. Overall pattern most in keeping with chronic HP   - 3rd opinon: June 2021 [facility] [place] - Hypersensitivity Pneumonitis   -High-resolution CT chest including April 09, 2018: Lack of craniocaudal gradient and significant air trapping suggestive of chronic hypersensitivity pneumonitis [September 2016 [facility] evaluation thought CT was inconsistent with UIP]  - May 2021: stability since 2019 but progression sice 2016  -Clinical diagnosis of chronic hypersensitivity pneumonitis   - [facility]  -No clear-cut exposure identified at [facility] evaluation September 2016 by Dr. Ileene Hutchinson  -Steroids recommended but patient preferred not to take it September 2016 at [facility]   -Treatment   - deferred steroids sept 2016 visit at [facility] with Dr Ileene Hutchinson and again at [facility] in June 2021, Apr 2022  - nintedanib sole Rx since March  2020       HPI [name] 82 y.o. -presents for follow-up.  Last seen in the fall 2021.  After that overall she is been stable but last month she picked up a respiratory infection got antibiotic and prednisone and then she is better.  Nevertheless she thinks her symptoms may be a little worse than baseline.  Still she says she gets by with 3-4 L of oxygen which is her baseline.  Subjective symptom score overall has not changed.  She had pulmonary function test today and this shows a decline it is all documented below.  I visualized this.  Her last CT scan of the chest was in May 2021.  1 year anniversary of the CT scan is coming up.  Over the winter she declined to have right heart catheterization because of COVID-19.  But at this point in time with the improvement in the pandemic prevalence rate in the local community of COVID she is willing to have a right heart catheterization.  She is tolerating nintedanib just fine without any problems.  A walking desaturation test on room air is as below      OV 10/27/2020  Subjective:  Patient ID: [name], female , DOB: 08-Feb-1941 , age 89 y.o. , MRN: [id number] , ADDRESS: [address] [place] [address] PCP Daisy Floro, MD Patient Care Team: Daisy Floro, MD as PCP - General (Family Medicine) Laurey Morale, MD as PCP - Advanced Heart Failure (Cardiology) Aida Puffer, MD as Referring Physician (Family Medicine) Loreli Slot, MD as Consulting Physician (Cardiothoracic Surgery)  This Provider for this visit: Treatment Team:  Attending Provider: Kalman Shan, MD   #  Interstitial lung disease (progressive) with chronic hypoxemic respiratory failure - chronic HP dx -Per [facility] September 2016: Disease present at least since 2008  - Surgical lung biopsy  November 20, 2014 at [place]:   - local opinion: diagnostic of usual interstitial pneumonia (UIP). Interestingly, there  are scattered non-necrotizing granulomata in the pleura.  -Second opinion September 2016 at [facility]: pathology was reviewed at our multidisciplinary ILD conference and noted to have a variegated pattern of fibrosis with some airway centricity, granulomas noted both near the airway and more peripherally. Minimal inflammation was noted. Features not c/w with UIP. Few giant cells with cholesterol clefts. Overall pattern most in keeping with chronic HP   - 3rd opinon: June 2021 [facility] [place] - Hypersensitivity Pneumonitis   -High-resolution CT chest including April 09, 2018: Lack of craniocaudal gradient and significant air trapping suggestive of chronic hypersensitivity pneumonitis [September 2016 [facility] evaluation thought CT was inconsistent with UIP]  - May 2021: stability since 2019 but progression sice 2016  -Clinical diagnosis of chronic hypersensitivity pneumonitis   - [facility]  -No clear-cut exposure identified at [facility] evaluation September 2016 by Dr. Ileene Hutchinson  -Steroids recommended but patient preferred not to take it September 2016 at [facility]   -Treatment   - deferred steroids sept 2016 visit at [facility] with Dr Ileene Hutchinson and again at [facility] in June 2021, Apr 2022  - nintedanib sole Rx since March 2020    10/27/2020 -  TELEPHONE CALL  Type of visit: Telephone/Video Circumstance: COVID-19 national emergency Identification of patient [name] with [birth year] and MRN [id number] - 2 person identifier Risks: Risks, benefits, limitations of telephone visit explained. Patient understood and verbalized agreement to proceed Anyone else on call:  Patient location: [phone] This provider location: [address] [facility], [place], [place]   HPI [name] 82 y.o. -this telephone visit is for a few reasons  1)  grandson getting married in July in [place] and [facility] is elevated. Wants to go. Son Denies thiniong of  taking her to [facility]. It is 4000 feet + /1300 meters. Advised them [place] safeer from [place]  2) discussed heart catheterization results which are essentially normal.  3) discussed high-resolution CT chest because of concern of progression but in the last 1 year her ILD is stable on nintedanib.  She is happy about this  4) discuss future therapeutic options: At this point in time she is maxed out on therapy.  Not interested in prednisone.  She is on antifibrotic nintedanib.  We discussed the pulse inhaled nitric oxide study phase 3.  She is very interested in this.  We discussed about the device and also convenience and buying her oxygen backpack called [employer].  I believe she has a copy of the consent for the study.  We discussed the principles of this.  She is very interested.  She wants to hear from the research coordinator.  I have sent the research coordinator Marc Morgans message.  Advised may be a few weeks.   Heart Cath 10/14/20  1. Minimally elevated PA pressure with PVR only 2.1.  2. Normal filling pressures.  3. No significant coronary disease.  R[facility] Procedural Findings: Hemodynamics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 Pulse Ox/HR 94% and 87/min 95% and HR 83  Final Pulse Ox/HR 85% and 113/min 82% and HR 89  Desaturated </= 88% yes   Desaturated <= 3% points yes   Got Tachycardic >/= 90/min yes   Symptoms at end of test Mod dyspnea   Miscellaneous comments desat at end iof 1 lap. STable on room air        PFT     Latest Ref Rng & Units 09/23/2022   12:14 PM 11/25/2021    3:10 PM 09/24/2020    2:56 PM 12/03/2019   11:39 AM 12/03/2019    9:57 AM 07/19/2019    3:54 PM 05/01/2017    2:05 PM  ILD indicators  FVC-Pre L 1.46  1.58  1.52  1.70  1.66  1.61  1.71   FVC-Predicted Pre % 52  54  50  57  55  53  55   FVC-Post L      1.87  1.72   FVC-Predicted Post %      61  55   TLC L      3.63  3.37   TLC Predicted %      65  61   DLCO uncorrected ml/min/mmHg 11.24  11.86  5.95  15.25  13.20  17.11  11.23   DLCO UNC %Pred % 56  57  28  73  63  82  39   DLCO Corrected ml/min/mmHg 11.24  11.97  5.95  15.25  13.20   10.63   DLCO COR %Pred % 56  58  28  73  63   37       LAB RESULTS last 96 hours No results found.  LAB RESULTS last 90 days No results found for this or any previous visit (from the past 2160 hour(s)).       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   reports that she has never smoked. She has never used  smokeless tobacco.  Past Surgical History:  Procedure Laterality Date   BREAST CYST EXCISION Left 50 yrs ago   CARPELL TUNNELL SURG Right    COLONOSCOPY     ESOPHAGOGASTRODUODENOSCOPY     EYE SURGERY     bilateral cataract surgery with lens implants   KNEE ARTHROSCOPY W/ MENISCAL REPAIR     left   LUNG BIOPSY Right 11/20/2014   Procedure: LUNG BIOPSY;  Surgeon: Loreli Slot, MD;  Location: [facility] OR;  Service: Thoracic;  Laterality: Right;   REVERSE SHOULDER ARTHROPLASTY Right 06/07/2018   Procedure: REVERSE SHOULDER ARTHROPLASTY;  Surgeon: Francena Hanly, MD;  Location: [facility];  Service: Orthopedics;  Laterality: Right;    RIGHT/LEFT HEART CATH AND CORONARY ANGIOGRAPHY N/A 10/14/2020   Procedure: RIGHT/LEFT HEART CATH AND CORONARY ANGIOGRAPHY;  Surgeon: Laurey Morale, MD;  Location: [facility] INVASIVE CV LAB;  Service: Cardiovascular;  Laterality: N/A;   TOTAL KNEE ARTHROPLASTY Left 08/13/2013   Procedure: LEFT TOTAL KNEE ARTHROPLASTY;  Surgeon: Shelda Pal, MD;  Location: [facility];  Service: Orthopedics;  Laterality: Left;   UMBILICAL HERNIA REPAIR     VIDEO ASSISTED THORACOSCOPY Right 11/20/2014   Procedure: RIGHT  VIDEO ASSISTED THORACOSCOPY WITH WEDGE LUNG BIOPSIES RIGHT UPPER,MIDDLE AND LOWER LOBES ,PLACEMENT OF ON Q PAIN PUMP;  Surgeon: Loreli Slot, MD;  Location: [facility] OR;  Service: Thoracic;  Laterality: Right;    Allergies  Allergen Reactions   Adhesive [Tape] Itching and Rash   Latex Itching and Swelling   Doxycycline Rash   Sulfonamide Derivatives Other (See Comments)    Unknown    Immunization History  Administered Date(s) Administered   Fluad Quad(high Dose 65+) 02/07/2019, 05/06/2021, 02/22/2022   Influenza Split 02/28/2012, 03/02/2015, 04/20/2016, 02/12/2018   Influenza, High Dose Seasonal PF 04/20/2016, 02/23/2017, 02/12/2018   Influenza,inj,quad, With Preservative 04/14/2014   Influenza-Unspecified 03/06/2014, 02/18/2015   PFIZER(Purple Top)SARS-COV-2  Vaccination 07/12/2019, 08/06/2019, 04/12/2020   Pneumococcal Conjugate-13 04/14/2014   Pneumococcal Polysaccharide-23 06/14/2017   Tdap 06/14/2017   Zoster, Live 04/24/2013    Family History  Problem Relation Age of Onset   Tuberculosis Father    Tuberculosis Paternal Uncle      Current Outpatient Medications:    amoxicillin (AMOXIL) 500 MG capsule, Take 1 capsule (500 mg total) by mouth 3 (three) times daily., Disp: 30 capsule, Rfl: 0   aspirin 325 MG tablet, Take 650 mg by mouth daily as needed for moderate pain or headache., Disp: , Rfl:    Benralizumab (FASENRA) 30 MG/ML SOSY, Inject into the skin. Inject 30mg  into the skin at Week 0, Week 4, Week 8 then every 8 weeks (receives at [facility]), Disp: , Rfl:    Cholecalciferol (DIALYVITE VITAMIN D 5000) 125 MCG (5000 UT) capsule, Take 5,000 Units by mouth daily., Disp: , Rfl:    fluticasone (FLONASE) 50 MCG/ACT nasal spray, Place 1 spray into both nostrils daily as needed for allergies. , Disp: , Rfl:    furosemide (LASIX) 20 MG tablet, as needed., Disp: , Rfl:    ibuprofen (ADVIL) 200 MG tablet, Take 200-400 mg by mouth every 6 (six) hours as needed for headache or moderate pain., Disp: , Rfl:    loratadine (CLARITIN) 5 MG chewable tablet, Chew 5-10 mg by mouth daily as needed for allergies. , Disp: , Rfl:    Multiple Vitamin (MULTIVITAMIN WITH MINERALS) TABS tablet, Take 1 tablet by mouth daily., Disp: , Rfl:    Nintedanib (OFEV) 150 MG CAPS, Take 1 capsule (150 mg total) by mouth 2 (two) times daily., Disp: 60 capsule, Rfl: 4   Respiratory Therapy Supplies (FLUTTER) DEVI, Use as directed., Disp: 1 each, Rfl: 0   sodium chloride HYPERTONIC 3 % nebulizer solution, Take by nebulization in the morning and at bedtime., Disp: 750 mL, Rfl: 1   VENTOLIN HFA 108 (90 Base) MCG/ACT inhaler, INHALE 2 PUFFS INTO THE LUNGS EVERY 6 HOURS AS NEEDED FOR WHEEZING OR SHORTNESS OF BREATH, Disp: 18 g, Rfl:  6   vitamin B-12  (CYANOCOBALAMIN) 1000 MCG tablet, Take 1,000 mcg by mouth every other day., Disp: , Rfl:       Objective:   Vitals:   01/12/23 1428  BP: (!) 140/70  Pulse: 99  SpO2: 96%  Weight: 187 lb (84.8 kg)  Height: 5\' 7"  (1.702 m)    Estimated body mass index is 29.29 kg/m as calculated from the following:   Height as of this encounter: 5\' 7"  (1.702 m).   Weight as of this encounter: 187 lb (84.8 kg).  @WEIGHTCHANGE @  [employer]   01/12/23 1428  Weight: 187 lb (84.8 kg)     Physical Exam   General: No distress. stable O2 at rest: yes Cane present: no Sitting in wheel chair: no Frail: no Obese: yes. HAS WALKER Neuro: Alert and Oriented x 3. GCS 15. Speech normal Psych: Pleasant Resp:  Barrel Chest - no.  Wheeze - no, Crackles - YES, No overt respiratory distress CVS: Normal heart sounds. Murmurs - no Ext: Stigmata of Connective Tissue Disease - no HEENT: Normal upper airway. PEERL +. No post nasal drip        Assessment:       ICD-10-CM   1. ILD (interstitial lung disease) (HCC)  J84.9     2. Hypersensitivity pneumonitis (HCC)  J67.9     3. 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    History of recurrent bronchitis with acute bronchitis /asthma flare up - 02/22/2022, Oct 2023, Nov 2023, Dec  2023 History of eosinophilia 2018 and 2023 with postiive RAST panel e-asthma , allergic asthma with flare up  Seems you handled allergy season beetter with fasenra and overall upper airwawy symptoms and respiratory symptoms are better on fasenra. However, Harrington Challenger is causing arthralgia.  Plan  - Continue inhaler therapy with Flonase, hypertonic saline and albuterol as needed - Stop Fasenra  Arthralgia  Harrington Challenger might be causing increased joint pain though it is not a known side effect    Plan (share decision making) -Stop Fasenra and monitor    Followup  -  visit - 30 min in 3 months  - symptoms score and walk test at followup   FOLLOWUP No follow-ups on file.    SIGNATURE    Dr. Kalman Shan, M.D., F.C.C.P,  Pulmonary and Critical Care Medicine Staff Physician, [facility] Center Director - Interstitial Lung Disease  Program  Pulmonary Fibrosis [facility] at [facility] [place], [place], [address]  Pager: [phone], If no answer or between  15:00h - 7:00h: call [phone] Telephone: [phone]  3:16 PM 01/12/2023

## 2023-01-12 NOTE — Patient Instructions (Addendum)
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    History of recurrent bronchitis with acute bronchitis /asthma flare up - 02/22/2022, Oct 2023, Nov 2023, Dec 2023 History of eosinophilia 2018 and 2023 with postiive RAST panel e-asthma , allergic asthma with flare up  Seems you handled allergy season beetter with fasenra and overall upper airwawy symptoms and respiratory symptoms are better on fasenra. However, Harrington Challenger is causing arthralgia.  Plan  - Continue inhaler therapy with Flonase, hypertonic saline and albuterol as needed - Stop Fasenra  Arthralgia  Harrington Challenger might be causing increased joint pain though it is not a known side effect    Plan (share decision making) -Stop Fasenra and monitor    Followup  -  visit - 30 min in 3 months  - symptoms score and walk test at followup

## 2023-01-19 DIAGNOSIS — M1711 Unilateral primary osteoarthritis, right knee: Secondary | ICD-10-CM | POA: Diagnosis not present

## 2023-01-19 DIAGNOSIS — M25561 Pain in right knee: Secondary | ICD-10-CM | POA: Diagnosis not present

## 2023-01-24 ENCOUNTER — Telehealth: Payer: Self-pay

## 2023-01-24 ENCOUNTER — Ambulatory Visit: Payer: Medicare Other

## 2023-01-24 NOTE — Telephone Encounter (Signed)
Called patient yesterday to confirm injection appointment for Fasenra on 01/24/23. Patient cancelled appointment, stating that physician wanted her to discontinue the medication. Message sent to Dr. Marchelle Gearing, who confirmed that patient should discontinue.   Wyvonne Lenz, RN

## 2023-02-03 NOTE — Telephone Encounter (Signed)
DME company calling the fax cam through [employer] not [employer] and was missing part of the order their fax #[phone]

## 2023-02-09 DIAGNOSIS — M1711 Unilateral primary osteoarthritis, right knee: Secondary | ICD-10-CM | POA: Diagnosis not present

## 2023-02-09 DIAGNOSIS — M25561 Pain in right knee: Secondary | ICD-10-CM | POA: Diagnosis not present

## 2023-02-10 ENCOUNTER — Telehealth: Payer: Self-pay | Admitting: Internal Medicine

## 2023-02-10 NOTE — Telephone Encounter (Signed)
Jade from [employer] is calling about a oxygen fax needed to be signed by [facility]. Please sign and fax back to [phone]

## 2023-02-13 NOTE — Telephone Encounter (Signed)
Homero Fellers from [employer] on fax sent for oxygen. Homero Fellers phone number is [phone].

## 2023-02-13 NOTE — Telephone Encounter (Signed)
When form has been signed it will be faxed back to [employer]. Nothing further needed at this time.

## 2023-02-14 DIAGNOSIS — M25561 Pain in right knee: Secondary | ICD-10-CM | POA: Diagnosis not present

## 2023-02-14 DIAGNOSIS — M1711 Unilateral primary osteoarthritis, right knee: Secondary | ICD-10-CM | POA: Diagnosis not present

## 2023-02-15 ENCOUNTER — Telehealth: Payer: Self-pay | Admitting: Internal Medicine

## 2023-02-15 MED ORDER — OFEV 150 MG PO CAPS: 1.0000 | ORAL_CAPSULE | Freq: Two times a day (BID) | ORAL | 1 refills | Status: DC

## 2023-02-15 NOTE — Telephone Encounter (Signed)
Refill for Ofev 150mg  twice daily sent to [facility] pharmacy. LFTs on 01/12/23 wnl. Called pt to advise  Patient states she was buying portable O2 concentration and her son was paying for it out of pocket. Appears they need orders signed by Dr. Marchelle Gearing from [place].  Chesley Mires, PharmD, MPH, BCPS, CPP Clinical Pharmacist (Rheumatology and Pulmonology)

## 2023-02-15 NOTE — Telephone Encounter (Signed)
Patient checking on message for portable oxygen concentrator. Also checking on research program. Has not received RX for Ofev. Patient phone number is [phone].

## 2023-02-16 NOTE — Telephone Encounter (Signed)
Dr Marchelle Gearing has to sign the order which was placed in front of him a few days ago. As soon as he signs the order it will be faxed.

## 2023-02-16 NOTE — Telephone Encounter (Signed)
[name] from [employer] on order for POC. [name] phone number is [phone].

## 2023-02-17 DIAGNOSIS — M1711 Unilateral primary osteoarthritis, right knee: Secondary | ICD-10-CM | POA: Diagnosis not present

## 2023-02-17 DIAGNOSIS — M25561 Pain in right knee: Secondary | ICD-10-CM | POA: Diagnosis not present

## 2023-02-17 NOTE — Telephone Encounter (Signed)
Please see messages from 02/15/23.

## 2023-02-17 NOTE — Telephone Encounter (Signed)
This is a old message I do not have any forms for Ramaswamy please check his box

## 2023-02-20 DIAGNOSIS — M1711 Unilateral primary osteoarthritis, right knee: Secondary | ICD-10-CM | POA: Diagnosis not present

## 2023-02-20 DIAGNOSIS — M25561 Pain in right knee: Secondary | ICD-10-CM | POA: Diagnosis not present

## 2023-02-23 DIAGNOSIS — M1711 Unilateral primary osteoarthritis, right knee: Secondary | ICD-10-CM | POA: Diagnosis not present

## 2023-02-23 DIAGNOSIS — M25561 Pain in right knee: Secondary | ICD-10-CM | POA: Diagnosis not present

## 2023-03-09 ENCOUNTER — Other Ambulatory Visit: Payer: Self-pay | Admitting: Primary Care

## 2023-03-10 ENCOUNTER — Telehealth: Payer: Self-pay | Admitting: Internal Medicine

## 2023-03-10 MED ORDER — PREDNISONE 10 MG PO TABS: ORAL_TABLET | ORAL | 0 refills | Status: DC

## 2023-03-10 MED ORDER — AZITHROMYCIN 250 MG PO TABS: 250.0000 mg | ORAL_TABLET | ORAL | 0 refills | Status: DC

## 2023-03-10 MED ORDER — SODIUM CHLORIDE 3 % IN NEBU: INHALATION_SOLUTION | Freq: Two times a day (BID) | RESPIRATORY_TRACT | 1 refills | Status: AC

## 2023-03-10 MED ORDER — VENTOLIN HFA 108 (90 BASE) MCG/ACT IN AERS: 2.0000 | INHALATION_SPRAY | Freq: Four times a day (QID) | RESPIRATORY_TRACT | 6 refills | Status: AC | PRN

## 2023-03-10 NOTE — Telephone Encounter (Signed)
I called and spoke with the pt and notified of response per MR  Pt verbalized understanding  Rx was sent to pharm  Nothing further needed

## 2023-03-10 NOTE — Telephone Encounter (Signed)
Patient is currently sick and is struggling with congestion issues. She would like some antibiotics. Please call and advise.

## 2023-03-10 NOTE — Telephone Encounter (Signed)
I called and spoke with the pt  She c/o increased SOB, nasal congestion, wheezing, eye matting, sneezing and cough x 3 wks  She is coughing up thick, yellow sputum  She says when symptoms first began 3 wks ago she had fever  She was sick after being around sick grandkids  She did not take a covid test  She says the congestion and cough are lingering  She had not been using hypertonic nebs or albuterol- I refilled these  She is using otc nasal spray   Please advise, thanks!  Allergies  Allergen Reactions   Adhesive [Tape] Itching and Rash   Latex Itching and Swelling   Doxycycline Rash   Sulfonamide Derivatives Other (See Comments)    Unknown

## 2023-03-10 NOTE — Telephone Encounter (Signed)
   Azithromycin  Please take prednisone 40 mg x1 day, then 30 mg x1 day, then 20 mg x1 day, then 10 mg x1 day, and then 5 mg x1 day and stop   Allergies  Allergen Reactions   Adhesive [Tape] Itching and Rash   Latex Itching and Swelling   Doxycycline Rash   Sulfonamide Derivatives Other (See Comments)    Unknown

## 2023-04-05 DIAGNOSIS — M19012 Primary osteoarthritis, left shoulder: Secondary | ICD-10-CM | POA: Diagnosis not present

## 2023-04-06 DIAGNOSIS — M19012 Primary osteoarthritis, left shoulder: Secondary | ICD-10-CM | POA: Diagnosis not present

## 2023-04-10 NOTE — Patient Instructions (Incomplete)
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    History of recurrent bronchitis with acute bronchitis /asthma flare up - 02/22/2022, Oct 2023, Nov 2023, Dec 2023 History of eosinophilia 2018 and 2023 with postiive RAST panel e-asthma , allergic asthma with flare up  Seems you handled allergy season beetter with fasenra and overall upper airwawy symptoms and respiratory symptoms are better on fasenra. However, Harrington Challenger is causing arthralgia.  Plan  - Continue inhaler therapy with Flonase, hypertonic saline and albuterol as needed - Stop Fasenra  Arthralgia  Harrington Challenger might be causing increased joint pain though it is not a known side effect    Plan (share decision making) -Stop Fasenra and monitor    Followup  -  visit - 30 min in 3 months  - symptoms score and walk test at followup

## 2023-04-10 NOTE — Progress Notes (Unsigned)
xxxxxxxxxxxxxxxxxxxxx  OV 07/29/2019 -transfer of care to Dr. Marchelle Gearing interstitial lung disease center.  Subjective:  Patient ID: [name], female , DOB: February 12, 1941 , age 82 y.o. , MRN: [id number] , ADDRESS: [address] [place] [address]   07/29/2019 -   Chief Complaint  Patient presents with   Follow-up    Pt states her breathing has become worse since last visit. States she also has an occ cough.   #Interstitial lung disease (progressive) with chronic hypoxemic respiratory failure - chronic HP dx -Per [facility] September 2016: Disease present at least since May 15, 2007  - Surgical lung biopsy  November 20, 2014 at [place]:   - local opinion: diagnostic of usual interstitial pneumonia (UIP). Interestingly, there are scattered non-necrotizing granulomata in the pleura.  -Second opinion September 2016 at [facility]: pathology was reviewed at our multidisciplinary ILD conference and noted to have a variegated pattern of fibrosis with some airway centricity, granulomas noted both near the airway and more peripherally. Minimal inflammation was noted. Features not c/w with UIP. Few giant cells with cholesterol clefts. Overall pattern most in keeping with chronic HP  -High-resolution CT chest including April 09, 2018: Lack of craniocaudal gradient and significant air trapping suggestive of chronic hypersensitivity pneumonitis [September 2016 [facility] evaluation thought CT was inconsistent with UIP]  -Clinical diagnosis of chronic hypersensitivity pneumonitis   - [facility]  -No clear-cut exposure identified at [facility] evaluation September 2016 by Dr. Ileene Hutchinson  -Steroids recommended but patient preferred not to take it September 2016 at [facility]  -Treatment   - deferred steroids sept 05-15-2015 visit at [facility] with Dr Lorella Nimrod  - nintedanib since March 2020   #Small hiatal hernia with acid reflux disease  HPI [name] 82 y.o. -is a  transfer of care from Dr. Kendrick Fries to Dr. Marchelle Gearing with interstitial lung disease questionnaire.  She was last seen by Dr. Kendrick Fries in March 2020.  After the onset of the COVID-19 pandemic Dr. Kendrick Fries is now fully in charge of the Covid hospital [facility].  Therefore he is not in the outpatient medical practice anymore.  Therefore patient has been transferred to the ILD center to Dr. Marchelle Gearing myself.  Is the first time I am meeting her and getting to know her.  She tells me that I took care of her husband for COPD a few years ago.  This was her ex-husband.  He passed away in May 14, 2012 after hospitalization to the ICU under our service.  She is grateful for the care.  She tells me that she has interstitial lung disease and for the last year she has been on nintedanib.  Currently she is having difficulty with nintedanib supply.  She only has 2 weeks left.  She uses 3 L of oxygen at rest.  She says she has been doing that for the last 4 years.  It is stable usage.  I am not really sure what her pulse ox on room air would be.  She says occasionally she would crank it up to 4 L for exertion but she generally uses 3 L both at rest and with exertion.  She says she tolerates nintedanib fine. Her interstitial lung disease history was reviewed and summarized above.  It appears that she was not keen on taking prednisone and her 15-May-2015 visit with Dr. Ileene Hutchinson at [facility].  Therefore once the IN[facility] data was available she was started on nintedanib a  year ago.She wants to have a son involved in her visits.  Because of the COVID-19 pandemic he is waiting outside in the car.  She says at the next visit she wants to bring him in.   Her symptom score is listed below.  Her progressive lung function decline is also listed below and the pulmonary function test reviewed.  I personally reviewed and visualized and interpreted those.  Her DLCO is paradoxically higher.  I think this is an error.   ROS - per  HPI  OV 10/03/2019  Subjective:  Patient ID: [name], female , DOB: March 27, 1941 , age 5 y.o. 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 Interestingly, there are scattered non-necrotizing granulomata in the pleura.  -Second opinion September 2016 at [facility]: pathology was reviewed at our multidisciplinary ILD conference and noted to have a variegated pattern of fibrosis with some airway centricity, granulomas noted both near the airway and more peripherally. Minimal inflammation was noted. Features not c/w with UIP. Few giant cells with cholesterol clefts. Overall pattern most in keeping with chronic HP  -High-resolution CT chest including April 09, 2018: Lack of craniocaudal gradient and significant air trapping suggestive of chronic hypersensitivity pneumonitis [September 2016 [facility] evaluation thought CT was inconsistent with UIP]  -Clinical diagnosis of chronic hypersensitivity pneumonitis   - dx [facility]  -No clear-cut exposure identified at [facility] evaluation September 2016 by Dr. Ileene Hutchinson  -Steroids recommended but patient preferred not to take it September 2016 at [facility]  -Treatment   - deferred steroids sept 2016 visit at [facility] with Dr Lorella Nimrod  - nintedanib since March 2020   #Small hiatal hernia with acid reflux disease  10/03/2019 -   Chief Complaint  Patient presents with   Follow-up    SOB and cough with activity has increasd, Productive cough with yellow sputum     HPI [name] 82 y.o. -returns for follow-up with her son.  At this point in time the focus is to establish the true nature of interstitial  lung disease.  She has brought her son with her.  However approximately 2 weeks ago she had acute bronchitis symptoms and called in.  Was given a cephalexin 5 days and prednisone 5 days.  These have helped immensely.  However she feels not back to baseline.  She feels she will benefit from another round of antibiotic with or without prednisone.  She finished ILD questionnaire and it is listed below.  We have not been able to discuss in a multidisciplinary case conference of 4. [facility] Comprehensive ILD Questionnaire  Symptoms: See below.   Past Medical History :  -Denies any collagen vascular disease or vasculitis.  Denies any knowledge of prior pulmonary hypertension.  Denies diabetes or thyroid disease.  Denies any stroke.  Denies mononucleosis denies tuberculosis denies pneumonia denies blood clots denies heart disease denies pleurisy   ROS: Positive for shoulder pain for the last several years but otherwise no dry eyes.  No Raynaud's.  No weight loss.  No nausea no vomiting no rash   FAMILY HISTORY of LUNG DISEASE: Denies any pulmonary fibrosis or COPD or cystic fibrosis of hypersensitive  pneumonitis or any other lung disease.   EXPOSURE HISTORY: Never smoked cigarettes.  No passive smoking.  No marijuana.  No vaping no cocaine no intravenous drug use.   HOME and HOBBY DETAILS : Current home is 25 years she is lived there for 5 years.  In the [place] setting there is no dampness.  No mold or mildew no feather pillow.  No steam iron use no Jacuzzi use no nebulizer use no humidifier use.  No pet birds or parakeets.  No pet gerbils no pet hamsters no pet rabbits no rodents.  No mold in the [facility] duct no music habits such as wind instruments no gardening.  No flood of water damage.  No strong mats.  No exposure to animals at work.  History update in April 2023: She reported that between 2010 2014 she lived in the mother's house that was built in the 1950s.  She thought there was mold in  this but never confirmed.  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  She also had a long-term tendency to clean her home heavily with Clorox.  She inhaled these fumes according to the son.  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   PULMONARY TOXICITY HISTORY (27 items): She has done prednisone burst.  She does not want to do long-term prednisone.  She has been on long-term nintedanib but tolerating it well.    Testing -2018 echo with elevated pulmonary systolic pressure 55 mmHg  - nov 2019 Last CT - HRCT IMPRESSION: 1. Spectrum of findings compatible with fibrotic interstitial lung disease with extensive air trapping and absence of basilar gradient, strongly favoring chronic hypersensitivity pneumonitis. Findings have slightly progressed since 04/24/2017, with more clear progression since more remote chest CT of 06/24/2014. Findings are suggestive of an alternative diagnosis (not UIP) per consensus guidelines: Diagnosis of Idiopathic Pulmonary Fibrosis: An Official ATS/ERS/JRS/ALAT Clinical Practice Guideline. Am Rosezetta Schlatter Crit Care Med Vol 198, Iss 5, [phone], Feb 04 2017. 2. Stable mild mediastinal lymphadenopathy, most compatible with benign reactive adenopathy. 3. One vessel coronary atherosclerosis. 4. Small hiatal hernia.   Aortic Atherosclerosis (ICD10-I70.0).     Results for KAMDEN, REBER (MRN [id number]) as of 07/29/2019 11:43  Ref. Range 07/04/2014 12:58 09/03/2015 11:04 09/27/2016 13:56 05/01/2017 14:05 07/19/2019 15:54  FVC-Pre Latest Units: L 2.13 1.93 1.86 1.71 1.61  FVC-%Pred-Pre Latest Units: % 66 61 59 55 53   Results for DARIAH, MCSORLEY (MRN [id number]) as of 07/29/2019 11:43  Ref.  Range 07/04/2014 12:58 09/03/2015 11:04 09/27/2016 13:56 05/01/2017 14:05 07/19/2019 15:54  DLCO unc Latest Units: ml/min/mmHg 13.70 14.70 12.69 11.23 17.11  DLCO unc % pred Latest Units: % 48 51 44 39 82     ROS  OV 12/03/2019  Subjective:  Patient ID: [name], female , DOB: Dec 23, 1940 , age 53 y.o. 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 Interestingly, there are scattered non-necrotizing granulomata in the pleura.  -Second opinion September 2016 at [facility]: pathology was reviewed at our multidisciplinary ILD conference and noted to have a variegated pattern of fibrosis with some airway centricity, granulomas noted both near the airway and more peripherally. Minimal inflammation was noted. Features not c/w with UIP. Few giant cells with cholesterol clefts. Overall pattern most in keeping with chronic HP   - 3rd opinon: June 2021 [facility] [place] - HP   -High-resolution CT chest including April 09, 2018: Lack of craniocaudal gradient and significant air trapping suggestive of chronic hypersensitivity pneumonitis [September 2016 [facility] evaluation thought CT was inconsistent with UIP]  -Clinical diagnosis of chronic hypersensitivity pneumonitis   - dx [facility]  -No clear-cut exposure identified at [facility] evaluation September 2016 by Dr. Ileene Hutchinson  -Steroids recommended but patient preferred not to take it September 2016 at [facility]  -Treatment   - deferred steroids  sept 2016 visit at [facility] with Dr Lorella Nimrod  - nintedanib since March 2020   #Small hiatal hernia with acid reflux disease   HPI Dorien G Lofland 82 y.o. -presents with her son for follow-up.  She continues to be on 3 L at rest 4 L with exertion.  Her acute bronchitis from April 2021 is resolved.  Her overall symptoms are stable/slightly better after her acute bronchitis.  There is no interim complaints.  In the interim we did discuss her at her case conference.  The consensus diagnosis is that she has hypersensitive pneumonitis.  The only exposure is when she was working in the [employer] at in 1960s.  She and her son again denies any ongoing exposures.  Reviewed her pulmonary function test and it is stable compared to the recent ones.  There is progression over the last few years to several years.  In terms of a high-resolution CT chest is also stable since 2019 through 2021 but progressive when compared to earlier time points.  She was started on nintedanib in March 2020.  She is tolerating this quite well.  It appears that she and she went on nintedanib she is stable.  She yet to do pulmonary rehabilitation.  Last done few years ago.  Son feels that she needs to be more mobile She is yet to connect with the support group She did not get referred to cardiology for right heart catheterization. She wants a flutter valve for her associated bronchiectasis seen on CT scan    OV 03/09/2020   Subjective:  Patient ID: [name], female , DOB: [birth year], age 68 y.o. years. , MRN: [id number],  ADDRESS: [address] [place] [address] PCP  Daisy Floro, MD Providers : Treatment Team:  Attending Provider: Kalman Shan, MD   Chief Complaint  Patient presents with   Follow-up    Patient wears 4 liters oxygen with exertion, was prescribed celebrex they want to know if it is ok with OFEV.      #Interstitial lung disease (progressive) with chronic hypoxemic respiratory  failure - chronic HP dx -Per [facility] September 2016: Disease present at least since 2008  - Surgical lung biopsy  November 20, 2014 at [place]:   - local opinion: diagnostic of usual interstitial pneumonia (UIP). Interestingly, there are scattered  non-necrotizing granulomata in the pleura.  -Second opinion September 2016 at [facility]: pathology was reviewed at our multidisciplinary ILD conference and noted to have a variegated pattern of fibrosis with some airway centricity, granulomas noted both near the airway and more peripherally. Minimal inflammation was noted. Features not c/w with UIP. Few giant cells with cholesterol clefts. Overall pattern most in keeping with chronic HP   - 3rd opinon: June 2021 [facility] [place] - Hypersensitivity Pneumonitis   -High-resolution CT chest including April 09, 2018: Lack of craniocaudal gradient and significant air trapping suggestive of chronic hypersensitivity pneumonitis [September 2016 [facility] evaluation thought CT was inconsistent with UIP]  -Clinical diagnosis of chronic hypersensitivity pneumonitis   - dx [facility]  -No clear-cut exposure identified at [facility] evaluation September 2016 by Dr. Ileene Hutchinson  -Steroids recommended but patient preferred not to take it September 2016 at [facility]  -Treatment   - deferred steroids sept 2016 visit at [facility] with Dr Ileene Hutchinson and again at [facility] in June 2021  - nintedanib sole Rx since March 2020     HPI Kei ROSA WYLY 82 y.o. -returns for follow-up with her son Maurine Minister.  Since her last visit in June 2020 when she continues to be stable.  She is enrolled in the pulmonary rehabilitation program which has helped her fatigue and shortness of breath significantly.  She continues on nintedanib without any adverse side effects.  She wanted to know if it is okay to take cephalexin for arthralgia.  She has chronic osteoarthritis.  She wants to avoid NSAIDs.  In terms of oxygen  use she is stable at 4 L nasal cannula.  She recently had pulmonary function test I reviewed this and it shows stability and is documented below.  Her weight continues to be stable.  We referred her for right heart catheterization with because of the Covid delta surge she deferred it.  Of note on February 28, 2020 while trying to get into the car out of her primary care physician's office she fell down and sustained abrasions and bruises injuries particularly in her lower extremity.  Since then her right kneecap patella area is red and warm.  There is a small incision injury there but this is healed.  She thinks it slightly better/stable.  She is not having any fever.  This has not been treated.      ROS - per HPI  CT chest high resolution - may 2021  IMPRESSION: 1. Spectrum of findings compatible with fibrotic interstitial lung disease with moderate air trapping and absence of apicobasilar gradient. Findings are most compatible with chronic hypersensitivity pneumonitis. No interval progression since 2019 chest CT. Evidence of progression since baseline 2016 chest CT. Findings are suggestive of an alternative diagnosis (not UIP) per consensus guidelines: Diagnosis of Idiopathic Pulmonary Fibrosis: An Official ATS/ERS/JRS/ALAT Clinical Practice Guideline. Am Rosezetta Schlatter Crit Care Med Vol 198, Iss 5, [phone], Feb 04 2017. 2. Stable mild mediastinal lymphadenopathy, compatible with benign reactive adenopathy. 3. Small hiatal hernia. 4. Aortic Atherosclerosis (ICD10-I70.0).     Electronically Signed   By: Delbert Phenix M.D.   On: 10/24/2019 15:02    OV 09/24/2020  Subjective:  Patient ID: [name], female , DOB: [birth year] , age 20 y.o. , MRN: [id number] , ADDRESS: [address] [place] [address] PCP Daisy Floro, MD Patient Care Team: Daisy Floro, MD as PCP - General (Family Medicine) Aida Puffer, MD as Referring Physician (Family  Medicine) Loreli Slot, MD as Consulting Physician (Cardiothoracic Surgery)  This Provider for this visit: Treatment Team:  Attending Provider: Kalman Shan, MD    09/24/2020 -   Chief Complaint  Patient presents with   Follow-up    Review PFT results. Still reports cough, sometimes productive with clear phlegm.      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 September 2016: Disease present at least since 2008  - Surgical lung biopsy  November 20, 2014 at [place]:   - local opinion: diagnostic of usual interstitial pneumonia (UIP). Interestingly, there are scattered non-necrotizing granulomata in the pleura.  -Second opinion September 2016 at [facility]: pathology was reviewed at our multidisciplinary ILD conference and noted to have a variegated pattern of fibrosis with some airway centricity, granulomas noted both near the airway and more peripherally. Minimal inflammation was noted. Features not c/w with UIP. Few giant cells with cholesterol clefts. Overall pattern most in keeping with chronic HP   - 3rd opinon: June 2021 [facility] [place] - Hypersensitivity Pneumonitis   -High-resolution CT chest including April 09, 2018: Lack of craniocaudal gradient and significant air trapping suggestive of chronic hypersensitivity pneumonitis [September 2016 [facility] evaluation thought CT was inconsistent with UIP]  - May 2021: stability since 2019 but progression sice 2016  -Clinical diagnosis of chronic hypersensitivity pneumonitis   - [facility]  -No clear-cut exposure identified at [facility] evaluation September 2016 by Dr. Ileene Hutchinson  -Steroids recommended but patient preferred not to take it September 2016 at [facility]   -Treatment   - deferred steroids sept 2016 visit at [facility] with Dr Ileene Hutchinson and again at [facility] in June 2021, Apr 2022  - nintedanib sole Rx since March  2020       HPI Heatherly ASTER SCREWS 82 y.o. -presents for follow-up.  Last seen in the fall 2021.  After that overall she is been stable but last month she picked up a respiratory infection got antibiotic and prednisone and then she is better.  Nevertheless she thinks her symptoms may be a little worse than baseline.  Still she says she gets by with 3-4 L of oxygen which is her baseline.  Subjective symptom score overall has not changed.  She had pulmonary function test today and this shows a decline it is all documented below.  I visualized this.  Her last CT scan of the chest was in May 2021.  1 year anniversary of the CT scan is coming up.  Over the winter she declined to have right heart catheterization because of COVID-19.  But at this point in time with the improvement in the pandemic prevalence rate in the local community of COVID she is willing to have a right heart catheterization.  She is tolerating nintedanib just fine without any problems.  A walking desaturation test on room air is as below      OV 10/27/2020  Subjective:  Patient ID: [name], female , DOB: 11-02-1940 , age 82 y.o. , MRN: [id number] , ADDRESS: [address] [place] [address] PCP Daisy Floro, MD Patient Care Team: Daisy Floro, MD as PCP - General (Family Medicine) Laurey Morale, MD as PCP - Advanced Heart Failure (Cardiology) Aida Puffer, MD as Referring Physician (Family Medicine) Loreli Slot, MD as Consulting Physician (Cardiothoracic Surgery)  This Provider for this visit: Treatment Team:  Attending Provider: Kalman Shan, MD   #  Interstitial lung disease (progressive) with chronic hypoxemic respiratory failure - chronic HP dx -Per [facility] September 2016: Disease present at least since 2008  - Surgical lung biopsy  November 20, 2014 at [place]:   - local opinion: diagnostic of usual interstitial pneumonia (UIP). Interestingly, there  are scattered non-necrotizing granulomata in the pleura.  -Second opinion September 2016 at [facility]: pathology was reviewed at our multidisciplinary ILD conference and noted to have a variegated pattern of fibrosis with some airway centricity, granulomas noted both near the airway and more peripherally. Minimal inflammation was noted. Features not c/w with UIP. Few giant cells with cholesterol clefts. Overall pattern most in keeping with chronic HP   - 3rd opinon: June 2021 [facility] [place] - Hypersensitivity Pneumonitis   -High-resolution CT chest including April 09, 2018: Lack of craniocaudal gradient and significant air trapping suggestive of chronic hypersensitivity pneumonitis [September 2016 [facility] evaluation thought CT was inconsistent with UIP]  - May 2021: stability since 2019 but progression sice 2016  -Clinical diagnosis of chronic hypersensitivity pneumonitis   - [facility]  -No clear-cut exposure identified at [facility] evaluation September 2016 by Dr. Ileene Hutchinson  -Steroids recommended but patient preferred not to take it September 2016 at [facility]   -Treatment   - deferred steroids sept 2016 visit at [facility] with Dr Ileene Hutchinson and again at [facility] in June 2021, Apr 2022  - nintedanib sole Rx since March 2020    10/27/2020 -  TELEPHONE CALL  Type of visit: Telephone/Video Circumstance: COVID-19 national emergency Identification of patient SHARUNDA SALMON with 08-01-1940 and MRN [id number] - 2 person identifier Risks: Risks, benefits, limitations of telephone visit explained. Patient understood and verbalized agreement to proceed Anyone else on call:  Patient location: [phone] This provider location: [address] [facility], [place], [place]   HPI Allesha TANAIRI CYPERT 82 y.o. -this telephone visit is for a few reasons  1)  grandson getting married in July in [place] and [facility] is elevated. Wants to go. Son Denies thiniong of  taking her to [facility]. It is 4000 feet + /1300 meters. Advised them [place] safeer from [place]  2) discussed heart catheterization results which are essentially normal.  3) discussed high-resolution CT chest because of concern of progression but in the last 1 year her ILD is stable on nintedanib.  She is happy about this  4) discuss future therapeutic options: At this point in time she is maxed out on therapy.  Not interested in prednisone.  She is on antifibrotic nintedanib.  We discussed the pulse inhaled nitric oxide study phase 3.  She is very interested in this.  We discussed about the device and also convenience and buying her oxygen backpack called [employer].  I believe she has a copy of the consent for the study.  We discussed the principles of this.  She is very interested.  She wants to hear from the research coordinator.  I have sent the research coordinator Marc Morgans message.  Advised may be a few weeks.   Heart Cath 10/14/20  1. Minimally elevated PA pressure with PVR only 2.1.  2. Normal filling pressures.  3. No significant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 not do this.  She might be interested in January 2025 after the holidays.  Last visit she had arthralgias and she stopped Fasenra but the arthralgias continue.  In fact she spent some time talking about the left shoulder pain for which she got steroids and frozen shoulder and neck pain.  I will defer this to ortho     SYMPTOM SCALE - ILD 07/29/2019  10/03/2019  12/03/2019 217# 03/09/2020  09/24/2020 207# 08/25/2021 - withdrew from iNP sutdy Weight 201# 09/28/2021 - 202#  11/25/2021 - 193#  02/22/2022 194# 05/17/2022  01/12/2023  04/11/2023   O2 use 3L at rest, occ 4L with exertion 3L at rest and 4L with exertion. Acute bronchitis 2 weeks ago 3L rest, 4 L exertion 3-4L [place] On ofev. Doing rehab 3/4L [place] pulse . On ofev 3L rest, 4L with eretiion. Weight 201# Treated oxygen with rest and 4 L exertion and pulse 3.5L rest, 4-5L exertion 4L Max  4L 4 L at rest and 5 L with heavy exertion. Ofev, fassnra   Shortness of Breath 0 -> 5 scale with 5 being worst (score 6 If unable to do)             At rest 0 1 0 0 0 1 0 1 1 0  1   Simple tasks - showers, clothes change, eating, shaving 1 2 2 2 1 3 2 2 3 1 1    Household (dishes, doing bed, laundry) 2 3 2 2  2.5 3 4 3 4 3 2    Shopping 2 2 2 2  2.5 2 3 2 3 2 2    Walking level at own pace 1 2 2 2 2 2 3 3 3 3 1    Walking up Stairs 6 4 4 6 5 6 6 5 6 5 5    Total (30-36) Dyspnea Score 12 14 12 14 13 17 18 16 20 14 12    How bad is your cough? 1 3.5  More often lateley 2 3 4 2 3 3 1  - fasneral   How bad is your fatigue 0 3.5 2 Better with exercise 2.5 3 4 3 3 3 1    How bad is nausea 0 0 0 0 0 0 0 0 0 0  0   How bad is vomiting?  0 0 0 0 0 00 0 0 0 0  0   How bad is diarrhea? 0 0 0 0 0 0 0 0 0 0  0   How bad is anxiety? 1  1.5 1 1 1  00 1 1 2 1  0   How bad is depression 1 1 1 1  0 0 1 1 2 1  00     Simple office walk 185 feet x  3 laps goal with forehead probe 09/24/2020  09/28/2021  04/11/2023   O2 used ra (baseline uses 3-4L at home) ra ra  Number laps completed End of 1 lap out of intented 3 Stoppsed at 3/4 of 1st lap   Comments about pace slow avg with walker   Resting Pulse Ox/HR 94% and 87/min 95% and HR 83 93% room air at rest  Final Pulse Ox/HR 85% and 113/min 82% and HR 89   Desaturated </= 88% yes    Desaturated <= 3% points yes    Got Tachycardic >/= 90/min yes    Symptoms at end of test Mod dyspnea    Miscellaneous comments desat at end iof 1 lap. STable on room air  PFT     Latest Ref Rng & Units 09/23/2022   12:14 PM 11/25/2021    3:10 PM 09/24/2020    2:56 PM 12/03/2019   11:39 AM 12/03/2019    9:57 AM 07/19/2019    3:54 PM 05/01/2017    2:05 PM  ILD indicators  FVC-Pre L 1.46  1.58  1.52  1.70  1.66  1.61  1.71   FVC-Predicted Pre % 52  54  50  57  55  53  55   FVC-Post L      1.87  1.72   FVC-Predicted Post %      61  55   TLC L      3.63  3.37   TLC Predicted %      65  61   DLCO uncorrected ml/min/mmHg 11.24  11.86  5.95  15.25  13.20  17.11  11.23   DLCO UNC %Pred % 56  57  28  73  63  82  39   DLCO Corrected ml/min/mmHg 11.24  11.97  5.95  15.25  13.20   10.63   DLCO COR %Pred % 56  58  28  73  63   37       LAB RESULTS last 96 hours No results found.  LAB RESULTS last 90 days Recent Results (from the past 2160 hour(s))  Hepatic function panel     Status: None   Collection Time: 01/12/23  3:28 PM  Result Value Ref Range   Total Bilirubin 0.3 0.2 - 1.2 mg/dL   Bilirubin, Direct 0.1 0.0 - 0.3 mg/dL   Alkaline Phosphatase 72 39 - 117 U/L   AST 16 0 - 37 U/L   ALT 10 0 - 35 U/L   Total Protein 7.0 6.0 - 8.3 g/dL   Albumin 3.9 3.5 - 5.2 g/dL         has a past medical history of Allergic rhinitis, Anxiety, Arthritis, Asthma, Carpal tunnel syndrome, CHF  (congestive heart failure) (HCC), GERD (gastroesophageal reflux disease), History of bronchitis (3-50yrs ago), History of colon polyps, Joint pain, Peripheral edema, Pneumonitis, hypersensitivity (HCC) (05/2016), Pulmonary hypertension (HCC) (06/30/2017), Shortness of breath dyspnea, and Thyroid nodule.   reports that she has never smoked. She has never used smokeless tobacco.  Past Surgical History:  Procedure Laterality Date   BREAST CYST EXCISION Left 50 yrs ago   CARPELL TUNNELL SURG Right    COLONOSCOPY     ESOPHAGOGASTRODUODENOSCOPY     EYE SURGERY     bilateral cataract surgery with lens implants   KNEE ARTHROSCOPY W/ MENISCAL REPAIR     left   LUNG BIOPSY Right 11/20/2014   Procedure: LUNG BIOPSY;  Surgeon: Loreli Slot, MD;  Location[facility] OR;  Service: Thoracic;  Laterality: Right;   REVERSE SHOULDER ARTHROPLASTY Right 06/07/2018   Procedure: REVERSE SHOULDER ARTHROPLASTY;  Surgeon: Francena Hanly, MD;  Location: [facility] ORS;  Service: Orthopedics;  Laterality: Right;    RIGHT/LEFT HEART CATH AND CORONARY ANGIOGRAPHY N/A 10/14/2020   Procedure: RIGHT/LEFT HEART CATH AND CORONARY ANGIOGRAPHY;  Surgeon: Laurey Morale, MD;  Location: [facility] INVASIVE CV LAB;  Service: Cardiovascular;  Laterality: N/A;   TOTAL KNEE ARTHROPLASTY Left 08/13/2013   Procedure: LEFT TOTAL KNEE ARTHROPLASTY;  Surgeon: Shelda Pal, MD;  Location: [facility] ORS;  Service: Orthopedics;  Laterality: Left;   UMBILICAL HERNIA REPAIR     VIDEO ASSISTED THORACOSCOPY Right 11/20/2014   Procedure: RIGHT  VIDEO ASSISTED THORACOSCOPY WITH  WEDGE LUNG BIOPSIES RIGHT UPPER,MIDDLE AND LOWER LOBES ,PLACEMENT OF ON Q PAIN PUMP;  Surgeon: Loreli Slot, MD;  Location: [facility] OR;  Service: Thoracic;  Laterality: Right;    Allergies  Allergen Reactions   Adhesive [Tape] Itching and Rash   Latex Itching and Swelling   Doxycycline Rash   Sulfonamide Derivatives Other (See Comments)    Unknown    Immunization History   Administered Date(s) Administered   Fluad Quad(high Dose 65+) 02/07/2019, 05/06/2021, 02/22/2022   Influenza Split 02/28/2012, 03/02/2015, 04/20/2016, 02/12/2018   Influenza, High Dose Seasonal PF 04/20/2016, 02/23/2017, 02/12/2018   Influenza,inj,quad, With Preservative 04/14/2014   Influenza-Unspecified 03/06/2014, 02/18/2015   PFIZER(Purple Top)SARS-COV-2 Vaccination 07/12/2019, 08/06/2019, 04/12/2020   Pneumococcal Conjugate-13 04/14/2014   Pneumococcal Polysaccharide-23 06/14/2017   Tdap 06/14/2017   Zoster, Live 04/24/2013    Family History  Problem Relation Age of Onset   Tuberculosis Father    Tuberculosis Paternal Uncle      Current Outpatient Medications:    aspirin 325 MG tablet, Take 650 mg by mouth daily as needed for moderate pain or headache., Disp: , Rfl:    azithromycin (ZITHROMAX) 250 MG tablet, Take 1 tablet (250 mg total) by mouth as directed., Disp: 6 tablet, Rfl: 0   Cholecalciferol (DIALYVITE VITAMIN D 5000) 125 MCG (5000 UT) capsule, Take 5,000 Units by mouth daily., Disp: , Rfl:    fluticasone (FLONASE) 50 MCG/ACT nasal spray, Place 1 spray into both nostrils daily as needed for allergies. , Disp: , Rfl:    furosemide (LASIX) 20 MG tablet, as needed., Disp: , Rfl:    ibuprofen (ADVIL) 200 MG tablet, Take 200-400 mg by mouth every 6 (six) hours as needed for headache or moderate pain., Disp: , Rfl:    loratadine (CLARITIN) 5 MG chewable tablet, Chew 5-10 mg by mouth daily as needed for allergies. , Disp: , Rfl:    Multiple Vitamin (MULTIVITAMIN WITH MINERALS) TABS tablet, Take 1 tablet by mouth daily., Disp: , Rfl:    Nintedanib (OFEV) 150 MG CAPS, Take 1 capsule (150 mg total) by mouth 2 (two) times daily., Disp: 180 capsule, Rfl: 1   Respiratory Therapy Supplies (FLUTTER) DEVI, Use as directed., Disp: 1 each, Rfl: 0   sodium chloride HYPERTONIC 3 % nebulizer solution, Take by nebulization in the morning and at bedtime. Dx J47.1, Disp: 750 mL, Rfl: 1    VENTOLIN HFA 108 (90 Base) MCG/ACT inhaler, Inhale 2 puffs into the lungs every 6 (six) hours as needed for wheezing or shortness of breath., Disp: 18 g, Rfl: 6   vitamin B-12 (CYANOCOBALAMIN) 1000 MCG tablet, Take 1,000 mcg by mouth every other day., Disp: , Rfl:    Benralizumab (FASENRA) 30 MG/ML SOSY, Inject into the skin. Inject 30mg  into the skin at Week 0, Week 4, Week 8 then every 8 weeks (receives at [employer]) (Patient not taking: Reported on 04/11/2023), Disp: , Rfl:    predniSONE (DELTASONE) 10 MG tablet, 4 x 1 day, 3 x 1 day, 2 x 1 day, 1 x 1 day, 1/2 x 1 day and stop (Patient not taking: Reported on 04/11/2023), Disp: 11 tablet, Rfl: 0      Objective:   Vitals:   04/11/23 0903  BP: (!) 146/81  Pulse: 89  SpO2: 94%  Weight: 183 lb 3.2 oz (83.1 kg)  Height: 5\' 5"  (1.651 m)    Estimated body mass index is 30.49 kg/m as calculated from the following:   Height as  of this encounter: 5\' 5"  (1.651 m).   Weight as of this encounter: 183 lb 3.2 oz (83.1 kg).  @WEIGHTCHANGE @  Filed Weights   04/11/23 0903  Weight: 183 lb 3.2 oz (83.1 kg)     Physical Exam   General: No distress.  Has lost weight.  Has oxygen on O2 at rest: Oxygen on Cane present: no but has walker Sitting in wheel chair: no Frail: no Obese: no Neuro: Alert and Oriented x 3. GCS 15. Speech normal Psych: Pleasant Resp:  Barrel Chest - no.  Wheeze - no, Crackles - no, No overt respiratory distress CVS: Normal heart sounds. Murmurs - no Ext: Stigmata of Connective Tissue Disease - no HEENT: Normal upper airway. PEERL +. No post nasal drip        Assessment:       ICD-10-CM   1. Interstitial lung disease (HCC)  J84.9 Pulmonary function test    B Nat Peptide    2. Hypersensitivity pneumonitis (HCC)  J67.9     3. DYSPNEA  R06.02 B Nat Peptide    B Nat Peptide    4. 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   - though pregressive disease over time clinically stable since last visit April 2-024  Plan  -  continue ofev   - check safety labs for ofev - check LFT 04/11/2023  - continue o2 4LNC at rest and more )4-5L) with exertion - goal pulse ox > 88% - check BNP 04/11/2023  - if elevated, like to discuss issue of cocnern for pulmonary hypertension at next visit - do spirometry and dlco in 3 months -Defer inhaled pirfenidone study but reassess in 3 months  - consent givein in August 2024    History of recurrent bronchitis with acute bronchitis /asthma flare up - 02/22/2022, Oct 2023, Nov 2023, Dec 2023 History of eosinophilia 2018 and 2023 with postiive RAST panel e-asthma , allergic asthma with flare up  Seems you handled allergy season beetter with fasenra and overall upper airwawy symptoms and respiratory symptoms are better on fasenra. Now off fasenral since summer 2024 and no recurrence  Arthralgia likely due to another cause (not fasenra)  Plan  - Continue inhaler therapy with Flonase, hypertonic saline and albuterol as needed   Arthralgia  Not due to fasenral because it persists despite stopping fasenra    Plan (share decision making) -monitor    Followup  - Dalaya Suppa visit - 30 min in 3 months but after PFT  - symptoms score and walk test at followup   FOLLOWUP Return in about 3 months (around 07/12/2023) for 30 min visit, after Cleda Daub and DLCO, ILD, Face to Face Visit, with Dr Marchelle Gearing.    SIGNATURE    Dr. Kalman Shan, M.D., F.C.C.P,  Pulmonary and Critical Care Medicine Staff Physician, [facility] Center Director - Interstitial Lung Disease  Program  Pulmonary Fibrosis [facility] at [facility] [place], [place], [address]  Pager: [phone], If no answer or between  15:00h - 7:00h: call [phone] Telephone: [phone]  9:35 AM 04/11/2023

## 2023-04-11 ENCOUNTER — Ambulatory Visit (INDEPENDENT_AMBULATORY_CARE_PROVIDER_SITE_OTHER): Payer: Medicare Other | Admitting: Internal Medicine

## 2023-04-11 ENCOUNTER — Encounter: Payer: Self-pay | Admitting: Internal Medicine

## 2023-04-11 VITALS — BP 146/81 | HR 89 | Ht 65.0 in | Wt 183.2 lb

## 2023-04-11 DIAGNOSIS — J849 Interstitial pulmonary disease, unspecified: Secondary | ICD-10-CM

## 2023-04-11 DIAGNOSIS — J84112 Idiopathic pulmonary fibrosis: Secondary | ICD-10-CM

## 2023-04-11 DIAGNOSIS — J679 Hypersensitivity pneumonitis due to unspecified organic dust: Secondary | ICD-10-CM | POA: Diagnosis not present

## 2023-04-11 DIAGNOSIS — J8283 Eosinophilic asthma: Secondary | ICD-10-CM

## 2023-04-11 DIAGNOSIS — M05739 Rheumatoid arthritis with rheumatoid factor of unspecified wrist without organ or systems involvement: Secondary | ICD-10-CM

## 2023-04-11 DIAGNOSIS — Z5181 Encounter for therapeutic drug level monitoring: Secondary | ICD-10-CM | POA: Diagnosis not present

## 2023-04-11 DIAGNOSIS — R0602 Shortness of breath: Secondary | ICD-10-CM | POA: Diagnosis not present

## 2023-04-11 DIAGNOSIS — Z9911 Dependence on respirator [ventilator] status: Secondary | ICD-10-CM | POA: Diagnosis not present

## 2023-04-11 DIAGNOSIS — I272 Pulmonary hypertension, unspecified: Secondary | ICD-10-CM | POA: Diagnosis not present

## 2023-04-11 NOTE — Addendum Note (Signed)
Addended by: Glynda Jaeger on: 04/11/2023 09:37 AM   Modules accepted: Orders

## 2023-04-19 DIAGNOSIS — M25511 Pain in right shoulder: Secondary | ICD-10-CM | POA: Diagnosis not present

## 2023-04-19 DIAGNOSIS — M19012 Primary osteoarthritis, left shoulder: Secondary | ICD-10-CM | POA: Diagnosis not present

## 2023-05-12 DIAGNOSIS — M542 Cervicalgia: Secondary | ICD-10-CM | POA: Diagnosis not present

## 2023-05-12 DIAGNOSIS — M5030 Other cervical disc degeneration, unspecified cervical region: Secondary | ICD-10-CM | POA: Diagnosis not present

## 2023-05-16 DIAGNOSIS — M25512 Pain in left shoulder: Secondary | ICD-10-CM | POA: Diagnosis not present

## 2023-06-05 DIAGNOSIS — M542 Cervicalgia: Secondary | ICD-10-CM | POA: Diagnosis not present

## 2023-06-09 DIAGNOSIS — M542 Cervicalgia: Secondary | ICD-10-CM | POA: Diagnosis not present

## 2023-06-09 DIAGNOSIS — M5030 Other cervical disc degeneration, unspecified cervical region: Secondary | ICD-10-CM | POA: Diagnosis not present

## 2023-06-26 DIAGNOSIS — M5412 Radiculopathy, cervical region: Secondary | ICD-10-CM | POA: Diagnosis not present

## 2023-07-05 DIAGNOSIS — M5412 Radiculopathy, cervical region: Secondary | ICD-10-CM | POA: Diagnosis not present

## 2023-07-18 DIAGNOSIS — M47812 Spondylosis without myelopathy or radiculopathy, cervical region: Secondary | ICD-10-CM | POA: Diagnosis not present

## 2023-07-18 DIAGNOSIS — M5412 Radiculopathy, cervical region: Secondary | ICD-10-CM | POA: Diagnosis not present

## 2023-08-08 DIAGNOSIS — M47812 Spondylosis without myelopathy or radiculopathy, cervical region: Secondary | ICD-10-CM | POA: Diagnosis not present

## 2023-08-10 ENCOUNTER — Ambulatory Visit (HOSPITAL_BASED_OUTPATIENT_CLINIC_OR_DEPARTMENT_OTHER): Payer: BLUE CROSS/BLUE SHIELD | Admitting: Internal Medicine

## 2023-08-10 DIAGNOSIS — J849 Interstitial pulmonary disease, unspecified: Secondary | ICD-10-CM

## 2023-08-10 LAB — PULMONARY FUNCTION TEST
DL/VA % pred: 99 %
DL/VA: 3.99 ml/min/mmHg/L
DLCO cor % pred: 73 %
DLCO cor: 14.22 ml/min/mmHg
DLCO unc % pred: 73 %
DLCO unc: 14.22 ml/min/mmHg
FEF 25-75 Pre: 0.77 L/s
FEF2575-%Pred-Pre: 58 %
FEV1-%Pred-Pre: 50 %
FEV1-Pre: 0.98 L
FEV1FVC-%Pred-Pre: 108 %
FEV6-%Pred-Pre: 49 %
FEV6-Pre: 1.22 L
FEV6FVC-%Pred-Pre: 104 %
FVC-%Pred-Pre: 46 %
FVC-Pre: 1.23 L
Pre FEV1/FVC ratio: 80 %
Pre FEV6/FVC Ratio: 99 %

## 2023-08-10 NOTE — Patient Instructions (Signed)
 Spirometry and DLCO Performed Today.

## 2023-08-10 NOTE — Progress Notes (Signed)
 Spirometry and DLCO Performed Today.

## 2023-08-21 DIAGNOSIS — M5412 Radiculopathy, cervical region: Secondary | ICD-10-CM | POA: Diagnosis not present

## 2023-08-21 DIAGNOSIS — M542 Cervicalgia: Secondary | ICD-10-CM | POA: Diagnosis not present

## 2023-08-21 DIAGNOSIS — M791 Myalgia, unspecified site: Secondary | ICD-10-CM | POA: Diagnosis not present

## 2023-08-29 ENCOUNTER — Encounter: Payer: Self-pay | Admitting: Internal Medicine

## 2023-08-29 ENCOUNTER — Ambulatory Visit: Payer: BLUE CROSS/BLUE SHIELD | Admitting: Internal Medicine

## 2023-08-29 VITALS — BP 130/78 | HR 98 | Ht 65.25 in | Wt 176.6 lb

## 2023-08-29 DIAGNOSIS — R0600 Dyspnea, unspecified: Secondary | ICD-10-CM | POA: Diagnosis not present

## 2023-08-29 DIAGNOSIS — J9611 Chronic respiratory failure with hypoxia: Secondary | ICD-10-CM

## 2023-08-29 DIAGNOSIS — R0602 Shortness of breath: Secondary | ICD-10-CM

## 2023-08-29 DIAGNOSIS — J849 Interstitial pulmonary disease, unspecified: Secondary | ICD-10-CM

## 2023-08-29 DIAGNOSIS — Z5181 Encounter for therapeutic drug level monitoring: Secondary | ICD-10-CM | POA: Diagnosis not present

## 2023-08-29 DIAGNOSIS — J679 Hypersensitivity pneumonitis due to unspecified organic dust: Secondary | ICD-10-CM | POA: Diagnosis not present

## 2023-08-29 LAB — HEPATIC FUNCTION PANEL
ALT: 10 U/L (ref 0–35)
AST: 18 U/L (ref 0–37)
Albumin: 3.9 g/dL (ref 3.5–5.2)
Alkaline Phosphatase: 78 U/L (ref 39–117)
Bilirubin, Direct: 0 mg/dL (ref 0.0–0.3)
Total Bilirubin: 0.2 mg/dL (ref 0.2–1.2)
Total Protein: 7.2 g/dL (ref 6.0–8.3)

## 2023-08-29 LAB — BRAIN NATRIURETIC PEPTIDE: Pro B Natriuretic peptide (BNP): 36 pg/mL (ref 0.0–100.0)

## 2023-08-29 NOTE — Progress Notes (Signed)
 xxxxxxxxxxxxxxxxxxxxx  OV 07/29/2019 -transfer of care to Dr. Marchelle Gearing interstitial lung disease center.  Subjective:  Patient ID: [name], female , DOB: 07-May-1941 , age 83 y.o. , MRN: [id number] , ADDRESS: [address] [place] [address]   07/29/2019 -   Chief Complaint  Patient presents with   Follow-up    Pt states her breathing has become worse since last visit. States she also has an occ cough.   #Interstitial lung disease (progressive) with chronic hypoxemic respiratory failure - chronic HP dx -Per [facility] September 2016: Disease present at least since 09/22/06  - Surgical lung biopsy  November 20, 2014 at [place]:   - local opinion: diagnostic of usual interstitial pneumonia (UIP). Interestingly, there are scattered non-necrotizing granulomata in the pleura.  -Second opinion September 2016 at [facility]: pathology was reviewed at our multidisciplinary ILD conference and noted to have a variegated pattern of fibrosis with some airway centricity, granulomas noted both near the airway and more peripherally. Minimal inflammation was noted. Features not c/w with UIP. Few giant cells with cholesterol clefts. Overall pattern most in keeping with chronic HP  -High-resolution CT chest including April 09, 2018: Lack of craniocaudal gradient and significant air trapping suggestive of chronic hypersensitivity pneumonitis [September 2016 [facility] evaluation thought CT was inconsistent with UIP]  -Clinical diagnosis of chronic hypersensitivity pneumonitis   - [facility]  -No clear-cut exposure identified at [facility] evaluation September 2016 by Dr. Ileene Hutchinson  -Steroids recommended but patient preferred not to take it September 2016 at [facility]  -Treatment   - deferred steroids sept 2014/09/22 visit at [facility] with Dr Lorella Nimrod  - nintedanib since 09/22/18  #Small hiatal hernia with acid reflux disease  HPI [name] 83 y.o. -is a  transfer of care from Dr. Kendrick Fries to Dr. Marchelle Gearing with interstitial lung disease questionnaire.  She was last seen by Dr. Kendrick Fries in Sep 22, 2018.  After the onset of the COVID-19 pandemic Dr. Kendrick Fries is now fully in charge of the Covid hospital [facility].  Therefore he is not in the outpatient medical practice anymore.  Therefore patient has been transferred to the ILD center to Dr. Marchelle Gearing myself.  Is the first time I am meeting her and getting to know her.  She tells me that I took care of her husband for COPD a few years ago.  This was her ex-husband.  He passed away in 2011-09-22 after hospitalization to the ICU under our service.  She is grateful for the care.  She tells me that she has interstitial lung disease and for the last year she has been on nintedanib.  Currently she is having difficulty with nintedanib supply.  She only has 2 weeks left.  She uses 3 L of oxygen at rest.  She says she has been doing that for the last 4 years.  It is stable usage.  I am not really sure what her pulse ox on room air would be.  She says occasionally she would crank it up to 4 L for exertion but she generally uses 3 L both at rest and with exertion.  She says she tolerates nintedanib fine. Her interstitial lung disease history was reviewed and summarized above.  It appears that she was not keen on taking prednisone and her 09/22/2014 visit with Dr. Ileene Hutchinson at [facility].  Therefore once the [facility] data was available she was started on nintedanib a  year ago.She wants to have a son involved in her visits.  Because of the COVID-19 pandemic he is waiting outside in the car.  She says at the next visit she wants to bring him in.   Her symptom score is listed below.  Her progressive lung function decline is also listed below and the pulmonary function test reviewed.  I personally reviewed and visualized and interpreted those.  Her DLCO is paradoxically higher.  I think this is an error.   ROS - per  HPI  OV 10/03/2019  Subjective:  Patient ID: [name], female , DOB: [birth year] , age 83 y.o. , MRN: [id number] , ADDRESS: 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 Interestingly, there are scattered non-necrotizing granulomata in the pleura.  -Second opinion September 2016 at [facility]: pathology was reviewed at our multidisciplinary ILD conference and noted to have a variegated pattern of fibrosis with some airway centricity, granulomas noted both near the airway and more peripherally. Minimal inflammation was noted. Features not c/w with UIP. Few giant cells with cholesterol clefts. Overall pattern most in keeping with chronic HP  -High-resolution CT chest including April 09, 2018: Lack of craniocaudal gradient and significant air trapping suggestive of chronic hypersensitivity pneumonitis [September 2016 [facility] evaluation thought CT was inconsistent with UIP]  -Clinical diagnosis of chronic hypersensitivity pneumonitis   - dx [facility]  -No clear-cut exposure identified at [facility] evaluation September 2016 by Dr. Ileene Hutchinson  -Steroids recommended but patient preferred not to take it September 2016 at [facility]  -Treatment   - deferred steroids sept 2016 visit at [facility] with Dr Lorella Nimrod  - nintedanib since March 2020   #Small hiatal hernia with acid reflux disease  10/03/2019 -   Chief Complaint  Patient presents with   Follow-up    SOB and cough with activity has increasd, Productive cough with yellow sputum     HPI [name] 83 y.o. -returns for follow-up with her son.  At this point in time the focus is to establish the true nature of interstitial  lung disease.  She has brought her son with her.  However approximately 2 weeks ago she had acute bronchitis symptoms and called in.  Was given a cephalexin 5 days and prednisone 5 days.  These have helped immensely.  However she feels not back to baseline.  She feels she will benefit from another round of antibiotic with or without prednisone.  She finished ILD questionnaire and it is listed below.  We have not been able to discuss in a multidisciplinary case conference of 4. [place] Integrated Comprehensive ILD Questionnaire  Symptoms: See below.   Past Medical History :  -Denies any collagen vascular disease or vasculitis.  Denies any knowledge of prior pulmonary hypertension.  Denies diabetes or thyroid disease.  Denies any stroke.  Denies mononucleosis denies tuberculosis denies pneumonia denies blood clots denies heart disease denies pleurisy   ROS: Positive for shoulder pain for the last several years but otherwise no dry eyes.  No Raynaud's.  No weight loss.  No nausea no vomiting no rash   FAMILY HISTORY of LUNG DISEASE: Denies any pulmonary fibrosis or COPD or cystic fibrosis of hypersensitive  pneumonitis or any other lung disease.   EXPOSURE HISTORY: Never smoked cigarettes.  No passive smoking.  No marijuana.  No vaping no cocaine no intravenous drug use.   HOME and HOBBY DETAILS : Current home is 25 years she is lived there for 5 years.  In [place] setting there is no dampness.  No mold or mildew no feather pillow.  No steam iron use no Jacuzzi use no nebulizer use no humidifier use.  No pet birds or parakeets.  No pet gerbils no pet hamsters no pet rabbits no rodents.  No mold in the [facility] duct no music habits such as wind instruments no gardening.  No flood of water damage.  No strong mats.  No exposure to animals at work.  History update in April 2023: She reported that between 2010 2014 she lived in the mother's house that was built in the 1950s.  She thought there was mold in  this but never confirmed.  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  She also had a long-term tendency to clean her home heavily with Clorox.  She inhaled these fumes according to the son.  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   PULMONARY TOXICITY HISTORY (27 items): She has done prednisone burst.  She does not want to do long-term prednisone.  She has been on long-term nintedanib but tolerating it well.    Testing -2018 echo with elevated pulmonary systolic pressure 55 mmHg  - nov 2019 Last CT - HRCT IMPRESSION: 1. Spectrum of findings compatible with fibrotic interstitial lung disease with extensive air trapping and absence of basilar gradient, strongly favoring chronic hypersensitivity pneumonitis. Findings have slightly progressed since 04/24/2017, with more clear progression since more remote chest CT of 06/24/2014. Findings are suggestive of an alternative diagnosis (not UIP) per consensus guidelines: Diagnosis of Idiopathic Pulmonary Fibrosis: An Official ATS/ERS/JRS/ALAT Clinical Practice Guideline. Am Rosezetta Schlatter Crit Care Med Vol 198, Iss 5, [id number], Feb 04 2017. 2. Stable mild mediastinal lymphadenopathy, most compatible with benign reactive adenopathy. 3. One vessel coronary atherosclerosis. 4. Small hiatal hernia.   Aortic Atherosclerosis (ICD10-I70.0).     Results for EMBERLIE, GOTCHER (MRN [id number]) as of 07/29/2019 11:43  Ref. Range 07/04/2014 12:58 09/03/2015 11:04 09/27/2016 13:56 05/01/2017 14:05 07/19/2019 15:54  FVC-Pre Latest Units: L 2.13 1.93 1.86 1.71 1.61  FVC-%Pred-Pre Latest Units: % 66 61 59 55 53   Results for OLUWANIFEMI, PETITTI (MRN [id number]) as of 07/29/2019 11:43  Ref.  Range 07/04/2014 12:58 09/03/2015 11:04 09/27/2016 13:56 05/01/2017 14:05 07/19/2019 15:54  DLCO unc Latest Units: ml/min/mmHg 13.70 14.70 12.69 11.23 17.11  DLCO unc % pred Latest Units: % 48 51 44 39 82     ROS  OV 12/03/2019  Subjective:  Patient ID: [name], female , DOB: [birth year] , age 68 y.o. 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 Interestingly, there are scattered non-necrotizing granulomata in the pleura.  -Second opinion September 2016 at [facility]: pathology was reviewed at our multidisciplinary ILD conference and noted to have a variegated pattern of fibrosis with some airway centricity, granulomas noted both near the airway and more peripherally. Minimal inflammation was noted. Features not c/w with UIP. Few giant cells with cholesterol clefts. Overall pattern most in keeping with chronic HP   - 3rd opinon: June 2021 [facility] [place] - HP   -High-resolution CT chest including April 09, 2018: Lack of craniocaudal gradient and significant air trapping suggestive of chronic hypersensitivity pneumonitis [September 2016 [facility] evaluation thought CT was inconsistent with UIP]  -Clinical diagnosis of chronic hypersensitivity pneumonitis   - [facility]  -No clear-cut exposure identified at [facility] evaluation September 2016 by Dr. Ileene Hutchinson  -Steroids recommended but patient preferred not to take it September 2016 at [facility]  -Treatment   - deferred steroids  sept 2016 visit at [facility] with Dr Lorella Nimrod  - nintedanib since March 2020   #Small hiatal hernia with acid reflux disease   HPI Mccall G Jablon 83 y.o. -presents with her son for follow-up.  She continues to be on 3 L at rest 4 L with exertion.  Her acute bronchitis from April 2021 is resolved.  Her overall symptoms are stable/slightly better after her acute bronchitis.  There is no interim complaints.  In the interim we did discuss her at her case conference.  The consensus diagnosis is that she has hypersensitive pneumonitis.  The only exposure is when she was working in the [employer] at in 1960s.  She and her son again denies any ongoing exposures.  Reviewed her pulmonary function test and it is stable compared to the recent ones.  There is progression over the last few years to several years.  In terms of a high-resolution CT chest is also stable since 2019 through 2021 but progressive when compared to earlier time points.  She was started on nintedanib in March 2020.  She is tolerating this quite well.  It appears that she and she went on nintedanib she is stable.  She yet to do pulmonary rehabilitation.  Last done few years ago.  Son feels that she needs to be more mobile She is yet to connect with the support group She did not get referred to cardiology for right heart catheterization. She wants a flutter valve for her associated bronchiectasis seen on CT scan    OV 03/09/2020   Subjective:  Patient ID: [name], female , DOB: 1941/01/21, age 52 y.o. years. , MRN: [id number],  ADDRESS: [address] [place] [address] PCP  Daisy Floro, MD Providers : Treatment Team:  Attending Provider: Kalman Shan, MD   Chief Complaint  Patient presents with   Follow-up    Patient wears 4 liters oxygen with exertion, was prescribed celebrex they want to know if it is ok with OFEV.      #Interstitial lung disease (progressive) with chronic hypoxemic respiratory  failure - chronic HP dx -Per [facility] September 2016: Disease present at least since 2008  - Surgical lung biopsy  November 20, 2014 at :   - local opinion: diagnostic of usual interstitial pneumonia (UIP). Interestingly, there are scattered  non-necrotizing granulomata in the pleura.  -Second opinion September 2016 at [facility]: pathology was reviewed at our multidisciplinary ILD conference and noted to have a variegated pattern of fibrosis with some airway centricity, granulomas noted both near the airway and more peripherally. Minimal inflammation was noted. Features not c/w with UIP. Few giant cells with cholesterol clefts. Overall pattern most in keeping with chronic HP   - 3rd opinon: June 2021 [facility] [place] - Hypersensitivity Pneumonitis   -High-resolution CT chest including April 09, 2018: Lack of craniocaudal gradient and significant air trapping suggestive of chronic hypersensitivity pneumonitis [September 2016 [facility] evaluation thought CT was inconsistent with UIP]  -Clinical diagnosis of chronic hypersensitivity pneumonitis   - dx [facility]  -No clear-cut exposure identified at [facility] evaluation September 2016 by Dr. Ileene Hutchinson  -Steroids recommended but patient preferred not to take it September 2016 at [facility]  -Treatment   - deferred steroids sept 2016 visit at [facility] with Dr Ileene Hutchinson and again at [facility] in June 2021  - nintedanib sole Rx since March 2020     HPI Syrenity JYRA LAGARES 83 y.o. -returns for follow-up with her son Maurine Minister.  Since her last visit in June 2020 when she continues to be stable.  She is enrolled in the pulmonary rehabilitation program which has helped her fatigue and shortness of breath significantly.  She continues on nintedanib without any adverse side effects.  She wanted to know if it is okay to take cephalexin for arthralgia.  She has chronic osteoarthritis.  She wants to avoid NSAIDs.  In terms of oxygen  use she is stable at 4 L nasal cannula.  She recently had pulmonary function test I reviewed this and it shows stability and is documented below.  Her weight continues to be stable.  We referred her for right heart catheterization with because of the Covid delta surge she deferred it.  Of note on February 28, 2020 while trying to get into the car out of her primary care physician's office she fell down and sustained abrasions and bruises injuries particularly in her lower extremity.  Since then her right kneecap patella area is red and warm.  There is a small incision injury there but this is healed.  She thinks it slightly better/stable.  She is not having any fever.  This has not been treated.      ROS - per HPI  CT chest high resolution - may 2021  IMPRESSION: 1. Spectrum of findings compatible with fibrotic interstitial lung disease with moderate air trapping and absence of apicobasilar gradient. Findings are most compatible with chronic hypersensitivity pneumonitis. No interval progression since 2019 chest CT. Evidence of progression since baseline 2016 chest CT. Findings are suggestive of an alternative diagnosis (not UIP) per consensus guidelines: Diagnosis of Idiopathic Pulmonary Fibrosis: An Official ATS/ERS/JRS/ALAT Clinical Practice Guideline. Am Rosezetta Schlatter Crit Care Med Vol 198, Iss 5, [phone], Feb 04 2017. 2. Stable mild mediastinal lymphadenopathy, compatible with benign reactive adenopathy. 3. Small hiatal hernia. 4. Aortic Atherosclerosis (ICD10-I70.0).     Electronically Signed   By: Delbert Phenix M.D.   On: 10/24/2019 15:02    OV 09/24/2020  Subjective:  Patient ID: [name], female , DOB: 1941-03-04 , age 24 y.o. , MRN: [id number] , ADDRESS: [address] [place] [address] PCP Daisy Floro, MD Patient Care Team: Daisy Floro, MD as PCP - General (Family Medicine) Aida Puffer, MD as Referring Physician (Family  Medicine) Loreli Slot, MD as Consulting Physician (Cardiothoracic Surgery)  This Provider for this visit: Treatment Team:  Attending Provider: Kalman Shan, MD    09/24/2020 -   Chief Complaint  Patient presents with   Follow-up    Review PFT results. Still reports cough, sometimes productive with clear phlegm.      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 September 2016: Disease present at least since 2008  - Surgical lung biopsy  November 20, 2014 at [place]:   - local opinion: diagnostic of usual interstitial pneumonia (UIP). Interestingly, there are scattered non-necrotizing granulomata in the pleura.  -Second opinion September 2016 at [facility]: pathology was reviewed at our multidisciplinary ILD conference and noted to have a variegated pattern of fibrosis with some airway centricity, granulomas noted both near the airway and more peripherally. Minimal inflammation was noted. Features not c/w with UIP. Few giant cells with cholesterol clefts. Overall pattern most in keeping with chronic HP   - 3rd opinon: June 2021 [facility] [place] - Hypersensitivity Pneumonitis   -High-resolution CT chest including April 09, 2018: Lack of craniocaudal gradient and significant air trapping suggestive of chronic hypersensitivity pneumonitis [September 2016 [facility] evaluation thought CT was inconsistent with UIP]  - May 2021: stability since 2019 but progression sice 2016  -Clinical diagnosis of chronic hypersensitivity pneumonitis   - dx [facility]  -No clear-cut exposure identified at [facility] evaluation September 2016 by Dr. Ileene Hutchinson  -Steroids recommended but patient preferred not to take it September 2016 at [facility]   -Treatment   - deferred steroids sept 2016 visit at [facility] with Dr Ileene Hutchinson and again at [facility] in June 2021, Apr 2022  - nintedanib sole Rx since March  2020       HPI [name] 83 y.o. -presents for follow-up.  Last seen in the fall 2021.  After that overall she is been stable but last month she picked up a respiratory infection got antibiotic and prednisone and then she is better.  Nevertheless she thinks her symptoms may be a little worse than baseline.  Still she says she gets by with 3-4 L of oxygen which is her baseline.  Subjective symptom score overall has not changed.  She had pulmonary function test today and this shows a decline it is all documented below.  I visualized this.  Her last CT scan of the chest was in May 2021.  1 year anniversary of the CT scan is coming up.  Over the winter she declined to have right heart catheterization because of COVID-19.  But at this point in time with the improvement in the pandemic prevalence rate in the local community of COVID she is willing to have a right heart catheterization.  She is tolerating nintedanib just fine without any problems.  A walking desaturation test on room air is as below      OV 10/27/2020  Subjective:  Patient ID: [name], female , DOB: [birth year] , age 14 y.o. , MRN: [id number] , ADDRESS: [address] [place] [address] PCP Daisy Floro, MD Patient Care Team: Daisy Floro, MD as PCP - General (Family Medicine) Laurey Morale, MD as PCP - Advanced Heart Failure (Cardiology) Aida Puffer, MD as Referring Physician (Family Medicine) Loreli Slot, MD as Consulting Physician (Cardiothoracic Surgery)  This Provider for this visit: Treatment Team:  Attending Provider: Kalman Shan, MD   #  Interstitial lung disease (progressive) with chronic hypoxemic respiratory failure - chronic HP dx -Per [facility] September 2016: Disease present at least since 2008  - Surgical lung biopsy  November 20, 2014 at [place]:   - local opinion: diagnostic of usual interstitial pneumonia (UIP). Interestingly, there  are scattered non-necrotizing granulomata in the pleura.  -Second opinion September 2016 at [facility]: pathology was reviewed at our multidisciplinary ILD conference and noted to have a variegated pattern of fibrosis with some airway centricity, granulomas noted both near the airway and more peripherally. Minimal inflammation was noted. Features not c/w with UIP. Few giant cells with cholesterol clefts. Overall pattern most in keeping with chronic HP   - 3rd opinon: June 2021 [facility] [place] - Hypersensitivity Pneumonitis   -High-resolution CT chest including April 09, 2018: Lack of craniocaudal gradient and significant air trapping suggestive of chronic hypersensitivity pneumonitis [September 2016 [facility] evaluation thought CT was inconsistent with UIP]  - May 2021: stability since 2019 but progression sice 2016  -Clinical diagnosis of chronic hypersensitivity pneumonitis   - [facility]  -No clear-cut exposure identified at [facility] evaluation September 2016 by Dr. Ileene Hutchinson  -Steroids recommended but patient preferred not to take it September 2016 at [facility]   -Treatment   - deferred steroids sept 2016 visit at [facility] with Dr Ileene Hutchinson and again at [facility] in June 2021, Apr 2022  - nintedanib sole Rx since March 2020    10/27/2020 -  TELEPHONE CALL  Type of visit: Telephone/Video Circumstance: COVID-19 national emergency Identification of patient [name] with 23-Nov-1940 and MRN [id number] - 2 person identifier Risks: Risks, benefits, limitations of telephone visit explained. Patient understood and verbalized agreement to proceed Anyone else on call:  Patient location: [phone] This provider location: [address] [facility], [place], [place]   HPI Daine CAYLEEN BENJAMIN 83 y.o. -this telephone visit is for a few reasons  1)  grandson getting married in July in [place] and [facility] is elevated. Wants to go. Son Denies thiniong of  taking her to [facility]. It is 4000 feet + /1300 meters. Advised them [place] is safeer from elevation  2) discussed heart catheterization results which are essentially normal.  3) discussed high-resolution CT chest because of concern of progression but in the last 1 year her ILD is stable on nintedanib.  She is happy about this  4) discuss future therapeutic options: At this point in time she is maxed out on therapy.  Not interested in prednisone.  She is on antifibrotic nintedanib.  We discussed the pulse inhaled nitric oxide study phase 3.  She is very interested in this.  We discussed about the device and also convenience and buying her oxygen backpack called [employer].  I believe she has a copy of the consent for the study.  We discussed the principles of this.  She is very interested.  She wants to hear from the research coordinator.  I have sent the research coordinator Marc Morgans message.  Advised may be a few weeks.   Heart Cath 10/14/20  1. Minimally elevated PA pressure with PVR only 2.1.  2. Normal filling pressures.  3. No significant coronary disease.  R[facility]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 preferred not to take it September 2016 at [facility]   -Positive RAST allergy panel  (grass, dustmike) and normal IgE but elevated  blood eosinophilia in April 2023  - 500cell April 2023  - 400ccells Sept 2023   - Started Symbicort June 2023  -Started Harrington Challenger January 2024. -> stopped aug 2024 due to arthraliga   -Normal right heart catheterization May 2022  -LAST ECHO Echo January 2024 with evidence of pulmonary hypertension: -  Patient held off on right heart catheterization early 2024.  -Unintentional weight loss noted in 2023  = Presumed secondary to nintedanib  -Treatment   - deferred steroids sept 2016 visit at [facility] with Dr Ileene Hutchinson and again at [facility] in June 2021, Apr 2022  - nintedanib sole Rx since March 2020  -Inhaled nitric oxide versus placebo research protocol: Withdrawal consent and follow-up March 2023  (study results launche  HPI [name] 83 y.o. -returns for follow-up.  Presents with her son Maurine Minister who is an independent historian.  She had pulmonary function test that shows further decline in FVC with the DLCO appears more stable.  She has a says she feels the same she continues to use 4 L oxygen at rest and 5 L with exertion.  She feels the breathing is not a big problem at this point in time.  Her goal is to live to be 83 years of age.  She says she is more dealing with arthralgia and arthritis.  3 weeks ago she tripped and fell on the rug while saying goodbye to her great granddaughter and bumped her head.  There  was bruising.  But no fracture.  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  She is afraid of starting physical therapy but that is the recommendation right now.  I encouraged her to attend physical therapy.  We did discuss about stopping nintedanib because she wanted to stop it.  I did advise her that she has progressive phenotype and the nintedanib I believe is helping her.  She has minimal side effects from it so I advised her to continue.  Also indicated we need to evaluate for pulmonary hypertension and progression and next visit we can do an echocardiogram and a CT scan of the chest and she was in alignment with this.  She is no longer on Fasenra and she has not seen any bronchitis episodes of wheezing.        SYMPTOM SCALE - ILD 07/29/2019  10/03/2019  12/03/2019 217# 03/09/2020  09/24/2020 207# 08/25/2021 - withdrew from iNP sutdy Weight 201# 09/28/2021 - 202#  11/25/2021 - 193#  02/22/2022 194# 05/17/2022  01/12/2023  04/11/2023   O2 use 3L at rest, occ 4L with exertion 3L at rest and 4L with exertion. Acute bronchitis 2 weeks ago 3L rest, 4 L exertion 3-4L [place] On ofev. Doing rehab 3/4L [place] pulse . On ofev 3L rest, 4L with eretiion. Weight 201# Treated oxygen with rest and 4 L exertion and pulse 3.5L rest, 4-5L exertion 4L Max  4L 4 L at rest and 5 L with heavy exertion. Ofev, fassnra 4 L at rest 5 L a heavy exertion.  On nintedanib.  Off Fasenra.  Dealing with  chronic pain.  Shortness of Breath 0 -> 5 scale with 5 being worst (score 6 If unable to do)             At rest 0 1 0 0 0 1 0 1 1 0  1   Simple tasks - showers, clothes change, eating, shaving 1 2 2 2 1 3 2 2 3 1 1  3.54  Household (dishes, doing bed, laundry) 2 3 2 2  2.5 3 4 3 4 3 2 4   Shopping 2 2 2 2  2.5 2 3 2 3 2 2  3.5  Walking level at own pace 1 2 2 2 2 2 3 3 3 3 1 4   Walking up Stairs 6 4 4 6 5 6 6 5 6 5 5  x  Total (30-36) Dyspnea Score 12 14 12 14 13 17 18 16 20 14 12 15   How bad is your  cough? 1 3.5  More often lateley 2 3 4 2 3 3 1  - fasneral 2.5  How bad is your fatigue 0 3.5 2 Better with exercise 2.5 3 4 3 3 3 1  3.5  How bad is nausea 0 0 0 0 0 0 0 0 0 0  0 0  How bad is vomiting?  0 0 0 0 0 00 0 0 0 0  0 0  How bad is diarrhea? 0 0 0 0 0 0 0 0 0 0  0 0  How bad is anxiety? 1 1.5 1 1 1  00 1 1 2 1  0 1  How bad is depression 1 1 1 1  0 0 1 1 2 1  00 1    Simple office walk 185 feet x  3 laps goal with forehead probe 09/24/2020  09/28/2021  04/11/2023  08/29/2023   O2 used ra (baseline uses 3-4L at home) ra ra 4L [place]  Number laps completed End of 1 lap out of intented 3 Stoppsed at 3/4 of 1st lap  1 alp of 3 n 4L [place]  Comments about pace slow avg with walker  With waker, avg pace  Resting Pulse Ox/HR 94% and 87/min 95% and HR 83 93% room air at rest 97% nd HR 88  Final Pulse Ox/HR 85% and 113/min 82% and HR 89  91% and HR 114  Desaturated </= 88% yes     Desaturated <= 3% points yes     Got Tachycardic >/= 90/min yes     Symptoms at end of test Mod dyspnea   dyspneic  Miscellaneous comments desat at end iof 1 lap. STable on room air         PFT     Latest Ref Rng & Units 08/10/2023    2:02 PM 09/23/2022   12:14 PM 11/25/2021    3:10 PM 09/24/2020    2:56 PM 12/03/2019   11:39 AM 12/03/2019    9:57 AM 07/19/2019    3:54 PM  PFT Results  FVC-Pre L 1.23  1.46  1.58  1.52  1.70  1.66  1.61   FVC-Predicted Pre % 46  52  54  50  57  55  53   FVC-Post L       1.87   FVC-Predicted Post %       61   Pre FEV1/FVC % % 80  82  79  80  81  82  81   Post FEV1/FCV % %       78   FEV1-Pre L 0.98  1.20  1.25  1.21  1.39  1.36  1.31   FEV1-Predicted Pre % 50  57  57  54  62  60  57   FEV1-Post L       1.46   DLCO uncorrected ml/min/mmHg 14.22  11.24  11.86  5.95  15.25  13.20  17.11   DLCO UNC% % 73  56  57  28  73  63  82   DLCO corrected ml/min/mmHg 14.22  11.24  11.97  5.95  15.25  13.20    DLCO COR %Predicted % 73  56  58  28  73  63    DLVA Predicted % 99  88  94  67  114   103  120   TLC L       3.63   TLC % Predicted %       65   RV % Predicted %       73        LAB RESULTS last 96 hours No results found.       has a past medical history of Allergic rhinitis, Anxiety, Arthritis, Asthma, Carpal tunnel syndrome, CHF (congestive heart failure) (HCC), GERD (gastroesophageal reflux disease), History of bronchitis (3-56yrs ago), History of colon polyps, Joint pain, Peripheral edema, Pneumonitis, hypersensitivity (HCC) (05/2016), Pulmonary hypertension (HCC) (06/30/2017), Shortness of breath dyspnea, and Thyroid nodule.   reports that she has never smoked. She has never used smokeless tobacco.  Past Surgical History:  Procedure Laterality Date   BREAST CYST EXCISION Left 50 yrs ago   CARPELL TUNNELL SURG Right    COLONOSCOPY     ESOPHAGOGASTRODUODENOSCOPY     EYE SURGERY     bilateral cataract surgery with lens implants   KNEE ARTHROSCOPY W/ MENISCAL REPAIR     left   LUNG BIOPSY Right 11/20/2014   Procedure: LUNG BIOPSY;  Surgeon: Loreli Slot, MD;  Location: [facility] OR;  Service: Thoracic;  Laterality: Right;   REVERSE SHOULDER ARTHROPLASTY Right 06/07/2018   Procedure: REVERSE SHOULDER ARTHROPLASTY;  Surgeon: Francena Hanly, MD;  Location: [facility] ORS;  Service: Orthopedics;  Laterality: Right;    RIGHT/LEFT HEART CATH AND CORONARY ANGIOGRAPHY N/A 10/14/2020   Procedure: RIGHT/LEFT HEART CATH AND CORONARY ANGIOGRAPHY;  Surgeon: Laurey Morale, MD;  Location: [facility] INVASIVE CV LAB;  Service: Cardiovascular;  Laterality: N/A;   TOTAL KNEE ARTHROPLASTY Left 08/13/2013   Procedure: LEFT TOTAL KNEE ARTHROPLASTY;  Surgeon: Shelda Pal, MD;  Location: [facility] ORS;  Service: Orthopedics;  Laterality: Left;   UMBILICAL HERNIA REPAIR     VIDEO ASSISTED THORACOSCOPY Right 11/20/2014   Procedure: RIGHT  VIDEO ASSISTED THORACOSCOPY WITH WEDGE LUNG BIOPSIES RIGHT UPPER,MIDDLE AND LOWER LOBES ,PLACEMENT OF ON Q PAIN PUMP;  Surgeon: Loreli Slot, MD;  Location:  [facility] OR;  Service: Thoracic;  Laterality: Right;    Allergies  Allergen Reactions   Adhesive [Tape] Itching and Rash   Latex Itching and Swelling   Doxycycline Rash   Sulfonamide Derivatives Other (See Comments)    Unknown    Immunization History  Administered Date(s) Administered   Fluad Quad(high Dose 65+) 02/07/2019, 05/06/2021, 02/22/2022   Influenza Split 02/28/2012, 03/02/2015, 04/20/2016, 02/12/2018   Influenza, High Dose Seasonal PF 04/20/2016, 02/23/2017, 02/12/2018   Influenza,inj,quad, With Preservative 04/14/2014   Influenza-Unspecified 03/06/2014, 02/18/2015   PFIZER(Purple Top)SARS-COV-2 Vaccination 07/12/2019, 08/06/2019, 04/12/2020   Pneumococcal Conjugate-13 04/14/2014   Pneumococcal Polysaccharide-23 06/14/2017   Tdap 06/14/2017   Zoster, Live 04/24/2013    Family  History  Problem Relation Age of Onset   Tuberculosis Father    Tuberculosis Paternal Uncle      Current Outpatient Medications:    aspirin 325 MG tablet, Take 650 mg by mouth daily as needed for moderate pain or headache., Disp: , Rfl:    Cholecalciferol (DIALYVITE VITAMIN D 5000) 125 MCG (5000 UT) capsule, Take 5,000 Units by mouth daily., Disp: , Rfl:    fluticasone (FLONASE) 50 MCG/ACT nasal spray, Place 1 spray into both nostrils daily as needed for allergies. , Disp: , Rfl:    ibuprofen (ADVIL) 200 MG tablet, Take 200-400 mg by mouth every 6 (six) hours as needed for headache or moderate pain., Disp: , Rfl:    loratadine (CLARITIN) 5 MG chewable tablet, Chew 5-10 mg by mouth daily as needed for allergies. , Disp: , Rfl:    Multiple Vitamin (MULTIVITAMIN WITH MINERALS) TABS tablet, Take 1 tablet by mouth daily., Disp: , Rfl:    Nintedanib (OFEV) 150 MG CAPS, Take 1 capsule (150 mg total) by mouth 2 (two) times daily., Disp: 180 capsule, Rfl: 1   Respiratory Therapy Supplies (FLUTTER) DEVI, Use as directed., Disp: 1 each, Rfl: 0   sodium chloride HYPERTONIC 3 % nebulizer solution, Take by  nebulization in the morning and at bedtime. Dx J47.1, Disp: 750 mL, Rfl: 1   VENTOLIN HFA 108 (90 Base) MCG/ACT inhaler, Inhale 2 puffs into the lungs every 6 (six) hours as needed for wheezing or shortness of breath., Disp: 18 g, Rfl: 6   vitamin B-12 (CYANOCOBALAMIN) 1000 MCG tablet, Take 1,000 mcg by mouth every other day., Disp: , Rfl:    furosemide (LASIX) 20 MG tablet, as needed., Disp: , Rfl:       Objective:   Vitals:   08/29/23 1105 08/29/23 1106  BP:  130/78  Pulse: 98   SpO2: 97%   Weight:  176 lb 9.6 oz (80.1 kg)  Height:  5' 5.25" (1.657 m)    Estimated body mass index is 29.16 kg/m as calculated from the following:   Height as of this encounter: 5' 5.25" (1.657 m).   Weight as of this encounter: 176 lb 9.6 oz (80.1 kg).  @WEIGHTCHANGE @  [employer]   08/29/23 1106  Weight: 176 lb 9.6 oz (80.1 kg)     Physical Exam   General: No distress.  Somewhat deconditioned looking O2 at rest: yes Cane present: no Sitting in wheel chair: no. WALKER + Frail: no Obese: no Neuro: Alert and Oriented x 3. GCS 15. Speech normal Psych: Pleasant Resp:  Barrel Chest - no.  Wheeze - no, Crackles - YES , No overt respiratory distress CVS: Normal heart sounds. Murmurs - no Ext: Stigmata of Connective Tissue Disease - no HEENT: Normal upper airway. PEERL +. No post nasal drip        Assessment:       ICD-10-CM   1. Chronic respiratory failure with hypoxia (HCC)  J96.11 B Nat Peptide    Hepatic function panel    ECHOCARDIOGRAM COMPLETE    CT Chest High Resolution    2. Interstitial lung disease (HCC)  J84.9 B Nat Peptide    Hepatic function panel    ECHOCARDIOGRAM COMPLETE    CT Chest High Resolution    3. Hypersensitivity pneumonitis (HCC)  J67.9 B Nat Peptide    Hepatic function panel    ECHOCARDIOGRAM COMPLETE    CT Chest High Resolution    4. DYSPNEA  R06.02 B Nat Peptide  Hepatic function panel    ECHOCARDIOGRAM COMPLETE    CT Chest High Resolution     5. 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 CHronic resp failure with hypoxemia   - though pregressive disease over time clinically stable since last visit Nov 2024 based on contnued 4L [place] need without change and subjective cale = I think ofev is helping you -   Plan  -  continue ofev   - check safety labs for ofev - check LFT 08/29/2023  - continue o2 4LNC at rest and more )4-5L) with exertion - goal pulse ox > 88% - check BNP 08/29/2023  - if elevated, like to discuss issue of cocnern for pulmonary hypertension at next visit -do echo in 3 months  - do HRCT in 3 months     History of recurrent bronchitis with acute bronchitis /asthma flare up - 02/22/2022, Oct 2023, Nov 2023, Dec 2023 History of eosinophilia 2018 and 2023 with postiive RAST panel e-asthma , allergic asthma with flare up  Now off fasenral since summer 2024 and no recurrence Arthralgia likely due to another cause (not fasenra)  Plan  - Continue inhaler therapy with Flonase, hypertonic saline and albuterol as needed   Arthralgia  Not due to fasenral because it persists despite stopping fasenra    Plan (share decision making) -monitor - let us see how PT works for you    Followup  - Ardath Lepak visit - 30 min in 3 months but after ECHO and CT  - symptoms score and walk test at followup   FOLLOWUP Return in about 3 months (around 11/29/2023).    SIGNATURE    Dr. Kalman Shan, M.D., F.C.C.P,  Pulmonary and Critical Care Medicine Staff Physician, [facility] Director - Interstitial Lung Disease  Program  Pulmonary Fibrosis [facility] at [facility] [place], [place], [address]  Pager: [phone], If no answer or between  15:00h - 7:00h: call [phone] Telephone: [phone]  11:43 AM 08/29/2023

## 2023-08-29 NOTE — Patient Instructions (Addendum)
 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 CHronic resp failure with hypoxemia   - though pregressive disease over time clinically stable since last visit Nov 2024 based on contnued 4L [place] need without change and subjective cale = I think ofev is helping you -   Plan  -  continue ofev   - check safety labs for ofev - check LFT 08/29/2023  - continue o2 4LNC at rest and more )4-5L) with exertion - goal pulse ox > 88% - check BNP 08/29/2023  - if elevated, like to discuss issue of cocnern for pulmonary hypertension at next visit -do echo in 3 months  - do HRCT in 3 months     History of recurrent bronchitis with acute bronchitis /asthma flare up - 02/22/2022, Oct 2023, Nov 2023, Dec 2023 History of eosinophilia 2018 and 2023 with postiive RAST panel e-asthma , allergic asthma with flare up  Now off fasenral since summer 2024 and no recurrence Arthralgia likely due to another cause (not fasenra)  Plan  - Continue inhaler therapy with Flonase, hypertonic saline and albuterol as needed   Arthralgia  Not due to fasenral because it persists despite stopping fasenra    Plan (share decision making) -monitor - let us see how PT works for you    Followup  - Barbarann Kelly visit - 30 min in 3 months but after ECHO and CT  - symptoms score and walk test at followup

## 2023-08-31 ENCOUNTER — Encounter: Payer: Self-pay | Admitting: Internal Medicine

## 2023-08-31 NOTE — Progress Notes (Signed)
 BNP and LFT normal

## 2023-09-08 DIAGNOSIS — M542 Cervicalgia: Secondary | ICD-10-CM | POA: Diagnosis not present

## 2023-09-15 ENCOUNTER — Other Ambulatory Visit: Payer: Self-pay | Admitting: Primary Care

## 2023-09-15 ENCOUNTER — Ambulatory Visit: Payer: Self-pay

## 2023-09-15 DIAGNOSIS — J209 Acute bronchitis, unspecified: Secondary | ICD-10-CM

## 2023-09-15 MED ORDER — AZITHROMYCIN 250 MG PO TABS: ORAL_TABLET | ORAL | 0 refills | Status: DC

## 2023-09-15 MED ORDER — PREDNISONE 10 MG PO TABS: ORAL_TABLET | ORAL | 0 refills | Status: DC

## 2023-09-15 MED ORDER — PREDNISONE 10 MG PO TABS: ORAL_TABLET | ORAL | 0 refills | Status: AC

## 2023-09-15 NOTE — Telephone Encounter (Signed)
RX sent. Pt notified

## 2023-09-15 NOTE — Telephone Encounter (Signed)
 Copied from CRM [id number]. Topic: Clinical - Red Word Triage >> Sep 15, 2023  9:30 AM Shelbie Proctor wrote: Red Word that prompted transfer to Nurse Triage: Patient [phone] or [phone] wants to be seen today with Kalman Shan, MD, states having shortness of breath, fatigue, congestion in throat and chest, coughing yellowish and brown phlegm. Patient denies dizziness, fever, nor pain. Please advise.   Chief Complaint: Cough Symptoms: Cough with gray sputum production, shortness of breath  Frequency: Frequent  Pertinent Negatives: Patient denies fever, dizziness Disposition: [] ED /[] Urgent Care (no appt availability in office) / [] Appointment(In office/virtual)/ []  [facility]/ [] Home Care/ [x] Refused Recommended Disposition /[] [place] [facility]/ []  Follow-up with PCP Additional Notes: Patient called to report that 3 days ago she began to experience a cough with congestion. She states that her cough is frequent and is productive of gray sputum. She states she is also experiencing increased shortness of breath with her cough. Patient speaking in complete sentences and no respiratory distress noted during call. Patient wanted to see Dr. Marchelle Gearing today in the office. I advised the patient that there are no available appointments in the office today and that she may have to go to the ED for treatment. Patient became upset and stated she would call another doctor she knows and ended the call.      E2C2 Pulmonary Triage - Initial Assessment Questions "Chief Complaint (e.g., cough, sob, wheezing, fever, chills, sweat or additional symptoms) *Go to specific symptom protocol after initial questions. Cough  "How long have symptoms been present?" 3 days ago  Have you tested for COVID or Flu? Note: If not, ask patient if a home test can be taken. If so, instruct patient to call back for positive results. No  MEDICINES:   "Have you used any OTC meds to help with symptoms?" No If  yes, ask "What medications?" N/A  "Have you used your inhalers/maintenance medication?" Yes If yes, "What medications?" Inhaler   If inhaler, ask "How many puffs and how often?" Note: Review instructions on medication in the chart. At night, 2 puffs   OXYGEN: "Do you wear supplemental oxygen?" Yes If yes, "How many liters are you supposed to use?" 4-5  "Do you monitor your oxygen levels?" Yes If yes, "What is your reading (oxygen level) today?" Has not been working correctly    Reason for Disposition  [1] MILD difficulty breathing (e.g., minimal/no SOB at rest, SOB with walking, pulse <100) AND [2] still present when not coughing  Answer Assessment - Initial Assessment Questions 1. ONSET: "When did the cough begin?"      3 days ago  2. SEVERITY: "How bad is the cough today?"      Moderate  3. SPUTUM: "Describe the color of your sputum" (none, dry cough; clear, white, yellow, green)     Gray  4. HEMOPTYSIS: "Are you coughing up any blood?" If so ask: "How much?" (flecks, streaks, tablespoons, etc.)     No 5. DIFFICULTY BREATHING: "Are you having difficulty breathing?" If Yes, ask: "How bad is it?" (e.g., mild, moderate, severe)    - MILD: No SOB at rest, mild SOB with walking, speaks normally in sentences, can lie down, no retractions, pulse < 100.    - MODERATE: SOB at rest, SOB with minimal exertion and prefers to sit, cannot lie down flat, speaks in phrases, mild retractions, audible wheezing, pulse 100-120.    - SEVERE: Very SOB at rest, speaks in single words, struggling to breathe, sitting  hunched forward, retractions, pulse > 120      Mild to moderate  6. FEVER: "Do you have a fever?" If Yes, ask: "What is your temperature, how was it measured, and when did it start?"     No 7. CARDIAC HISTORY: "Do you have any history of heart disease?" (e.g., heart attack, congestive heart failure)      No 8. LUNG HISTORY: "Do you have any history of lung disease?"  (e.g., pulmonary  embolus, asthma, emphysema)     Yes 9. PE RISK FACTORS: "Do you have a history of blood clots?" (or: recent major surgery, recent prolonged travel, bedridden)     No 10. OTHER SYMPTOMS: "Do you have any other symptoms?" (e.g., runny nose, wheezing, chest pain)       No  Protocols used: Cough - Acute Productive-A-AH

## 2023-09-15 NOTE — Telephone Encounter (Signed)
 FYI Dr Marchelle Gearing

## 2023-09-15 NOTE — Addendum Note (Signed)
 Addended by: Glenford Bayley on: 09/15/2023 07:02 PM   Modules accepted: Orders

## 2023-09-15 NOTE — Telephone Encounter (Signed)
 Okay please treated for acute bronchitis flare   Will do azithromycin - Z PAK  And 5-day prednisone - Please take prednisone 40 mg x1 day, then 30 mg x1 day, then 20 mg x1 day, then 10 mg x1 day, and then 5 mg x1 day and stop    But she should check for COVID and flu outpatient   For some reason my computer is not allowing me to E prescribe.  Please send the prescription and  Allergies  Allergen Reactions   Adhesive [Tape] Itching and Rash   Latex Itching and Swelling   Doxycycline Rash   Sulfonamide Derivatives Other (See Comments)    Unknown

## 2023-09-15 NOTE — Addendum Note (Signed)
 Addended by: Gay Filler T on: 09/15/2023 01:18 PM   Modules accepted: Orders

## 2023-09-15 NOTE — Progress Notes (Signed)
 Prescription was not electronically sent, called pharmacy and get verbal for Zpack and prednisone taper  Sent

## 2023-09-18 ENCOUNTER — Telehealth: Payer: Self-pay | Admitting: Internal Medicine

## 2023-09-18 NOTE — Telephone Encounter (Signed)
 Answering Service from 09/15/23:  Sick, congested,wants to speak with on call. Waiting for zpak prednisone. Please call back ASAP. Alt number [phone]

## 2023-09-19 NOTE — Telephone Encounter (Signed)
 MR is off the remaining of the week.

## 2023-09-19 NOTE — Telephone Encounter (Signed)
 According to pt's chart- prednisone and zpak was prescribed 09/15/2023. Pt stated that she started that zpak and prednisone 09/15/2023. She will completed course tomorrow.  She feels that sx have improved but have not completely subsided.  C/o prod cough with pale yellow to clear sputum and chest/nasal congestion. SOB is baseline. Denied f/c/s or additional sx.  She is concerned that she should extend prednisone and zpak.  She wears 4L cont. She does not monitor her oxygen levels.    Sarah, please advise. MR is unavailable.

## 2023-09-19 NOTE — Telephone Encounter (Signed)
 Pt is aware of below message/recommendations and voiced her understanding. No availability this week. Pt stated that she would contact PCP.  Nothing further needed.

## 2023-09-22 DIAGNOSIS — M542 Cervicalgia: Secondary | ICD-10-CM | POA: Diagnosis not present

## 2023-09-27 DIAGNOSIS — M542 Cervicalgia: Secondary | ICD-10-CM | POA: Diagnosis not present

## 2023-10-03 DIAGNOSIS — M542 Cervicalgia: Secondary | ICD-10-CM | POA: Diagnosis not present

## 2023-10-06 DIAGNOSIS — M542 Cervicalgia: Secondary | ICD-10-CM | POA: Diagnosis not present

## 2023-10-10 DIAGNOSIS — M542 Cervicalgia: Secondary | ICD-10-CM | POA: Diagnosis not present

## 2023-10-13 DIAGNOSIS — M542 Cervicalgia: Secondary | ICD-10-CM | POA: Diagnosis not present

## 2023-10-17 DIAGNOSIS — M542 Cervicalgia: Secondary | ICD-10-CM | POA: Diagnosis not present

## 2023-10-20 DIAGNOSIS — M542 Cervicalgia: Secondary | ICD-10-CM | POA: Diagnosis not present

## 2023-10-25 DIAGNOSIS — Z96611 Presence of right artificial shoulder joint: Secondary | ICD-10-CM | POA: Diagnosis not present

## 2023-10-25 DIAGNOSIS — M19012 Primary osteoarthritis, left shoulder: Secondary | ICD-10-CM | POA: Diagnosis not present

## 2023-11-07 ENCOUNTER — Ambulatory Visit
Admission: RE | Admit: 2023-11-07 | Discharge: 2023-11-07 | Disposition: A | Discharge status: Home / Self Care | Source: Ambulatory Visit | Attending: Internal Medicine | Admitting: Internal Medicine

## 2023-11-07 ENCOUNTER — Other Ambulatory Visit

## 2023-11-07 DIAGNOSIS — J849 Interstitial pulmonary disease, unspecified: Secondary | ICD-10-CM

## 2023-11-07 DIAGNOSIS — Z5181 Encounter for therapeutic drug level monitoring: Secondary | ICD-10-CM

## 2023-11-07 DIAGNOSIS — E041 Nontoxic single thyroid nodule: Secondary | ICD-10-CM | POA: Diagnosis not present

## 2023-11-07 DIAGNOSIS — R0602 Shortness of breath: Secondary | ICD-10-CM | POA: Diagnosis not present

## 2023-11-07 DIAGNOSIS — J9611 Chronic respiratory failure with hypoxia: Secondary | ICD-10-CM

## 2023-11-07 DIAGNOSIS — J189 Pneumonia, unspecified organism: Secondary | ICD-10-CM | POA: Diagnosis not present

## 2023-11-07 DIAGNOSIS — J679 Hypersensitivity pneumonitis due to unspecified organic dust: Secondary | ICD-10-CM

## 2023-11-07 DIAGNOSIS — J90 Pleural effusion, not elsewhere classified: Secondary | ICD-10-CM | POA: Diagnosis not present

## 2023-11-08 ENCOUNTER — Encounter: Payer: Self-pay | Admitting: Internal Medicine

## 2023-11-08 ENCOUNTER — Emergency Department (HOSPITAL_COMMUNITY)

## 2023-11-08 ENCOUNTER — Ambulatory Visit: Admitting: Student in an Organized Health Care Education/Training Program

## 2023-11-08 ENCOUNTER — Other Ambulatory Visit: Payer: Self-pay

## 2023-11-08 ENCOUNTER — Encounter: Payer: Self-pay | Admitting: Student in an Organized Health Care Education/Training Program

## 2023-11-08 ENCOUNTER — Ambulatory Visit: Payer: Self-pay | Admitting: Internal Medicine

## 2023-11-08 ENCOUNTER — Inpatient Hospital Stay (HOSPITAL_COMMUNITY)
Admission: EM | Admit: 2023-11-08 | Discharge: 2023-11-13 | DRG: 871 | Disposition: A | Discharge status: Home Health | Source: Ambulatory Visit | Attending: Internal Medicine | Admitting: Internal Medicine

## 2023-11-08 ENCOUNTER — Encounter (HOSPITAL_COMMUNITY): Payer: Self-pay | Admitting: *Deleted

## 2023-11-08 VITALS — BP 120/78 | HR 120 | Temp 97.1°F | Ht 65.25 in | Wt 176.0 lb

## 2023-11-08 DIAGNOSIS — I272 Pulmonary hypertension, unspecified: Secondary | ICD-10-CM | POA: Diagnosis present

## 2023-11-08 DIAGNOSIS — J189 Pneumonia, unspecified organism: Secondary | ICD-10-CM | POA: Diagnosis not present

## 2023-11-08 DIAGNOSIS — J8410 Pulmonary fibrosis, unspecified: Secondary | ICD-10-CM | POA: Diagnosis present

## 2023-11-08 DIAGNOSIS — J9621 Acute and chronic respiratory failure with hypoxia: Secondary | ICD-10-CM | POA: Diagnosis present

## 2023-11-08 DIAGNOSIS — J9611 Chronic respiratory failure with hypoxia: Secondary | ICD-10-CM | POA: Diagnosis present

## 2023-11-08 DIAGNOSIS — I5032 Chronic diastolic (congestive) heart failure: Secondary | ICD-10-CM | POA: Diagnosis present

## 2023-11-08 DIAGNOSIS — I10 Essential (primary) hypertension: Secondary | ICD-10-CM | POA: Diagnosis not present

## 2023-11-08 DIAGNOSIS — J1289 Other viral pneumonia: Secondary | ICD-10-CM | POA: Diagnosis not present

## 2023-11-08 DIAGNOSIS — J13 Pneumonia due to Streptococcus pneumoniae: Secondary | ICD-10-CM | POA: Diagnosis not present

## 2023-11-08 DIAGNOSIS — J849 Interstitial pulmonary disease, unspecified: Secondary | ICD-10-CM | POA: Diagnosis not present

## 2023-11-08 DIAGNOSIS — E669 Obesity, unspecified: Secondary | ICD-10-CM | POA: Diagnosis not present

## 2023-11-08 DIAGNOSIS — T502X5A Adverse effect of carbonic-anhydrase inhibitors, benzothiadiazides and other diuretics, initial encounter: Secondary | ICD-10-CM | POA: Diagnosis present

## 2023-11-08 DIAGNOSIS — Z9981 Dependence on supplemental oxygen: Secondary | ICD-10-CM

## 2023-11-08 DIAGNOSIS — B953 Streptococcus pneumoniae as the cause of diseases classified elsewhere: Secondary | ICD-10-CM | POA: Diagnosis present

## 2023-11-08 DIAGNOSIS — Z7982 Long term (current) use of aspirin: Secondary | ICD-10-CM

## 2023-11-08 DIAGNOSIS — Z7189 Other specified counseling: Secondary | ICD-10-CM | POA: Diagnosis not present

## 2023-11-08 DIAGNOSIS — I110 Hypertensive heart disease with heart failure: Secondary | ICD-10-CM | POA: Diagnosis not present

## 2023-11-08 DIAGNOSIS — B955 Unspecified streptococcus as the cause of diseases classified elsewhere: Secondary | ICD-10-CM | POA: Diagnosis not present

## 2023-11-08 DIAGNOSIS — B348 Other viral infections of unspecified site: Secondary | ICD-10-CM | POA: Insufficient documentation

## 2023-11-08 DIAGNOSIS — J679 Hypersensitivity pneumonitis due to unspecified organic dust: Secondary | ICD-10-CM | POA: Diagnosis not present

## 2023-11-08 DIAGNOSIS — R059 Cough, unspecified: Secondary | ICD-10-CM | POA: Diagnosis not present

## 2023-11-08 DIAGNOSIS — Z79899 Other long term (current) drug therapy: Secondary | ICD-10-CM

## 2023-11-08 DIAGNOSIS — M05739 Rheumatoid arthritis with rheumatoid factor of unspecified wrist without organ or systems involvement: Secondary | ICD-10-CM | POA: Diagnosis not present

## 2023-11-08 DIAGNOSIS — I480 Paroxysmal atrial fibrillation: Secondary | ICD-10-CM | POA: Diagnosis present

## 2023-11-08 DIAGNOSIS — E785 Hyperlipidemia, unspecified: Secondary | ICD-10-CM | POA: Diagnosis not present

## 2023-11-08 DIAGNOSIS — Z66 Do not resuscitate: Secondary | ICD-10-CM | POA: Diagnosis not present

## 2023-11-08 DIAGNOSIS — I4892 Unspecified atrial flutter: Secondary | ICD-10-CM | POA: Diagnosis not present

## 2023-11-08 DIAGNOSIS — Z515 Encounter for palliative care: Secondary | ICD-10-CM | POA: Diagnosis not present

## 2023-11-08 DIAGNOSIS — I4891 Unspecified atrial fibrillation: Secondary | ICD-10-CM | POA: Diagnosis not present

## 2023-11-08 DIAGNOSIS — J4550 Severe persistent asthma, uncomplicated: Secondary | ICD-10-CM | POA: Diagnosis present

## 2023-11-08 DIAGNOSIS — M069 Rheumatoid arthritis, unspecified: Secondary | ICD-10-CM | POA: Diagnosis not present

## 2023-11-08 DIAGNOSIS — E876 Hypokalemia: Secondary | ICD-10-CM | POA: Diagnosis not present

## 2023-11-08 DIAGNOSIS — R0602 Shortness of breath: Secondary | ICD-10-CM | POA: Diagnosis not present

## 2023-11-08 DIAGNOSIS — R7881 Bacteremia: Secondary | ICD-10-CM | POA: Diagnosis present

## 2023-11-08 DIAGNOSIS — A403 Sepsis due to Streptococcus pneumoniae: Principal | ICD-10-CM | POA: Diagnosis present

## 2023-11-08 DIAGNOSIS — J470 Bronchiectasis with acute lower respiratory infection: Secondary | ICD-10-CM | POA: Diagnosis not present

## 2023-11-08 DIAGNOSIS — J471 Bronchiectasis with (acute) exacerbation: Secondary | ICD-10-CM | POA: Diagnosis present

## 2023-11-08 DIAGNOSIS — I5030 Unspecified diastolic (congestive) heart failure: Secondary | ICD-10-CM | POA: Diagnosis present

## 2023-11-08 DIAGNOSIS — K219 Gastro-esophageal reflux disease without esophagitis: Secondary | ICD-10-CM | POA: Diagnosis not present

## 2023-11-08 DIAGNOSIS — Z91040 Latex allergy status: Secondary | ICD-10-CM

## 2023-11-08 DIAGNOSIS — Z6827 Body mass index (BMI) 27.0-27.9, adult: Secondary | ICD-10-CM

## 2023-11-08 LAB — BASIC METABOLIC PANEL WITH GFR
Anion gap: 14 (ref 5–15)
BUN: 15 mg/dL (ref 8–23)
CO2: 25 mmol/L (ref 22–32)
Calcium: 8.6 mg/dL — ABNORMAL LOW (ref 8.9–10.3)
Chloride: 93 mmol/L — ABNORMAL LOW (ref 98–111)
Creatinine, Ser: 0.68 mg/dL (ref 0.44–1.00)
GFR, Estimated: >60 mL/min (ref >60)
Glucose, Bld: 110 mg/dL — ABNORMAL HIGH (ref 70–99)
Potassium: 3.3 mmol/L — ABNORMAL LOW (ref 3.5–5.1)
Sodium: 132 mmol/L — ABNORMAL LOW (ref 135–145)

## 2023-11-08 LAB — RESPIRATORY PANEL BY PCR

## 2023-11-08 LAB — I-STAT CG4 LACTIC ACID, ED: Lactic Acid, Venous: 1.2 mmol/L (ref 0.5–1.9)

## 2023-11-08 LAB — PROCALCITONIN: Procalcitonin: 1.75 ng/mL

## 2023-11-08 LAB — CBC
HCT: 42.9 % (ref 36.0–46.0)
Hemoglobin: 13.8 g/dL (ref 12.0–15.0)
MCH: 29.6 pg (ref 26.0–34.0)
MCHC: 32.2 g/dL (ref 30.0–36.0)
MCV: 91.9 fL (ref 80.0–100.0)
Platelets: 284 10*3/uL (ref 150–400)
RBC: 4.67 MIL/uL (ref 3.87–5.11)
RDW: 14.2 % (ref 11.5–15.5)
WBC: 11 10*3/uL — ABNORMAL HIGH (ref 4.0–10.5)
nRBC: 0 % (ref 0.0–0.2)

## 2023-11-08 LAB — BRAIN NATRIURETIC PEPTIDE: B Natriuretic Peptide: 349.2 pg/mL — ABNORMAL HIGH (ref 0.0–100.0)

## 2023-11-08 LAB — MAGNESIUM: Magnesium: 2 mg/dL (ref 1.7–2.4)

## 2023-11-08 LAB — MRSA NEXT GEN BY PCR, NASAL: MRSA by PCR Next Gen: NOT DETECTED

## 2023-11-08 MED ORDER — SODIUM CHLORIDE 0.9 % IV SOLN
500.0000 mg | Freq: Once | INTRAVENOUS | Status: AC
  Administered 2023-11-08: 500 mg via INTRAVENOUS
  Filled 2023-11-08: qty 5

## 2023-11-08 MED ORDER — SODIUM CHLORIDE 3 % IN NEBU
4.0000 mL | INHALATION_SOLUTION | Freq: Two times a day (BID) | RESPIRATORY_TRACT | Status: AC
  Administered 2023-11-08 – 2023-11-11 (×4): 4 mL via RESPIRATORY_TRACT
  Filled 2023-11-08 (×6): qty 4

## 2023-11-08 MED ORDER — POLYETHYLENE GLYCOL 3350 17 G PO PACK
17.0000 g | PACK | Freq: Every day | ORAL | Status: DC | PRN
Start: 2023-11-08 — End: 2023-11-13

## 2023-11-08 MED ORDER — ACETAMINOPHEN 650 MG RE SUPP: 650.0000 mg | Freq: Four times a day (QID) | RECTAL | Status: DC | PRN

## 2023-11-08 MED ORDER — SODIUM CHLORIDE 0.9 % IV BOLUS
1000.0000 mL | Freq: Once | INTRAVENOUS | Status: AC
  Administered 2023-11-08: 1000 mL via INTRAVENOUS

## 2023-11-08 MED ORDER — ORAL CARE MOUTH RINSE: 15.0000 mL | OROMUCOSAL | Status: DC | PRN

## 2023-11-08 MED ORDER — POTASSIUM CHLORIDE CRYS ER 20 MEQ PO TBCR
40.0000 meq | EXTENDED_RELEASE_TABLET | Freq: Once | ORAL | Status: AC
  Administered 2023-11-08: 40 meq via ORAL
  Filled 2023-11-08: qty 2

## 2023-11-08 MED ORDER — IPRATROPIUM-ALBUTEROL 0.5-2.5 (3) MG/3ML IN SOLN
3.0000 mL | RESPIRATORY_TRACT | Status: DC | PRN
Start: 2023-11-08 — End: 2023-11-09
  Administered 2023-11-09: 3 mL via RESPIRATORY_TRACT

## 2023-11-08 MED ORDER — NINTEDANIB ESYLATE 150 MG PO CAPS
1.0000 | ORAL_CAPSULE | Freq: Two times a day (BID) | ORAL | Status: DC
  Filled 2023-11-08 (×2): qty 1

## 2023-11-08 MED ORDER — SODIUM CHLORIDE 0.9 % IV SOLN
500.0000 mg | INTRAVENOUS | Status: DC
  Administered 2023-11-09: 500 mg via INTRAVENOUS
  Filled 2023-11-08: qty 5

## 2023-11-08 MED ORDER — ACETAMINOPHEN 325 MG PO TABS
650.0000 mg | ORAL_TABLET | Freq: Four times a day (QID) | ORAL | Status: DC | PRN
  Administered 2023-11-10 – 2023-11-12 (×2): 650 mg via ORAL
  Filled 2023-11-08 (×3): qty 2

## 2023-11-08 MED ORDER — ENOXAPARIN SODIUM 40 MG/0.4ML IJ SOSY
40.0000 mg | PREFILLED_SYRINGE | INTRAMUSCULAR | Status: DC
  Administered 2023-11-08 – 2023-11-10 (×3): 40 mg via SUBCUTANEOUS
  Filled 2023-11-08 (×3): qty 0.4

## 2023-11-08 MED ORDER — SODIUM CHLORIDE 0.9% FLUSH
3.0000 mL | Freq: Two times a day (BID) | INTRAVENOUS | Status: DC
  Administered 2023-11-08 – 2023-11-13 (×9): 3 mL via INTRAVENOUS

## 2023-11-08 MED ORDER — SODIUM CHLORIDE 0.9 % IV SOLN
2.0000 g | INTRAVENOUS | Status: DC
  Administered 2023-11-09 – 2023-11-13 (×5): 2 g via INTRAVENOUS
  Filled 2023-11-08 (×5): qty 20

## 2023-11-08 MED ORDER — SODIUM CHLORIDE 0.9 % IV SOLN
1.0000 g | Freq: Once | INTRAVENOUS | Status: AC
  Administered 2023-11-08: 1 g via INTRAVENOUS
  Filled 2023-11-08: qty 10

## 2023-11-08 NOTE — Telephone Encounter (Signed)
 Received incoming call from pts son per DPR. Pt states something isnt right. No chest pain present. Pt wearing 4L continuous oxygen . Complains of shortness of breath x4days. CT scan 11/07/23 pt was exhausted during traveling and son states "that was the hardest thing ever because she was so exhausted and fatigued."

## 2023-11-08 NOTE — Telephone Encounter (Signed)
 Just and FYI- patient is scheduled to see you today at 10:15am.

## 2023-11-08 NOTE — Telephone Encounter (Signed)
 If she is int errible shape - see if any provider at any location of ours can see her 11/08/2023 . I cannot see her 11/08/2023 . CT chest 11/07/23 still NOT Read but to me looks worse and she could be having pneumonia or worsneing ILD. IF o2 levels are plumetting go to ER     [address] [place] [phone]

## 2023-11-08 NOTE — Progress Notes (Signed)
 Patient presenting for an acute visit today, with respiratory failure and shortness of breath. She has a history of hypersensitivity pneumonitis, on nintedanib , and followed by Dr. Bertrum Brodie.  On arrival to the clinic she was saturating at 86% on 5L via nasal cannula. She is visibly short of breath and reports cough and sputum production for the past month.  I have personally reviewed her chest CT and note consolidative opacities in the lingular, RUL, and parts of the LLL. This is significantly worse than her prior chest CT from 2023. The differential for this includes superimposed infection/bacterial pneumonia more so than a flare of her ILD. Given her significant hypoxia as well as findings of pneumonia, I've asked the patient to go to the emergency department so she can be evaluated and admitted as she is not safe for outpatient care for her presentation.  Vergia Glasgow, MD [facility] 11/08/2023 10:55 AM

## 2023-11-08 NOTE — Telephone Encounter (Signed)
 Patient is scheduled for visit in [place] today at 10:15.

## 2023-11-08 NOTE — ED Provider Notes (Signed)
 [place] [facility] DEPARTMENT AT [facility] Provider Note   CSN: [id number] Arrival date & time: 11/08/23  1143     History  Chief Complaint  Patient presents with   Shortness of Breath    [name] is a 83 y.o. female.  With a history of hypersensitivity pneumonitis, pulmonary hypertension who presents to ED for shortness of breath.  Patient sent in from pulmonology office given findings concerning for pneumonia seen yesterday on CT.  5 L at baseline.  Noted to be tachypneic hypoxic 86% at the pulmonology office.  Increased productive coughing over the last week with congestion.  No chest pain nausea vomiting   Shortness of Breath      Home Medications Prior to Admission medications   Medication Sig Start Date End Date Taking? Authorizing Provider  aspirin  325 MG tablet Take 650 mg by mouth daily as needed for moderate pain or headache.    [provider]  azithromycin  (ZITHROMAX ) 250 MG tablet Take 2 tablets by mouth on day 1; then 1 tablet daily x 4 additional days Patient not taking: Reported on 11/08/2023 09/15/23   Antonio Baumgarten, NP  Cholecalciferol (DIALYVITE VITAMIN D 5000) 125 MCG (5000 UT) capsule Take 5,000 Units by mouth daily.    [provider]  fluticasone  (FLONASE ) 50 MCG/ACT nasal spray Place 1 spray into both nostrils daily as needed for allergies.     [provider]  furosemide  (LASIX ) 20 MG tablet as needed. 09/30/13   [provider]  ibuprofen  (ADVIL ) 200 MG tablet Take 200-400 mg by mouth every 6 (six) hours as needed for headache or moderate pain.    [provider]  loratadine  (CLARITIN ) 5 MG chewable tablet Chew 5-10 mg by mouth daily as needed for allergies.     [provider]  Multiple Vitamin (MULTIVITAMIN WITH MINERALS) TABS tablet Take 1 tablet by mouth daily.    [provider]  Nintedanib  (OFEV ) 150 MG CAPS Take 1 capsule (150 mg total) by mouth 2 (two) times  daily. 02/15/23   Maire Scot, MD  Respiratory Therapy Supplies (FLUTTER) DEVI Use as directed. 12/28/17   Marine Sia, MD  sodium chloride  HYPERTONIC 3 % nebulizer solution Take by nebulization in the morning and at bedtime. Dx J47.1 03/10/23   Maire Scot, MD  VENTOLIN  HFA 108 (90 Base) MCG/ACT inhaler Inhale 2 puffs into the lungs every 6 (six) hours as needed for wheezing or shortness of breath. 03/10/23   Maire Scot, MD  vitamin B-12 (CYANOCOBALAMIN) 1000 MCG tablet Take 1,000 mcg by mouth every other day.    [provider]      Allergies    Adhesive [tape], Latex, Doxycycline , and Sulfonamide derivatives    Review of Systems   Review of Systems  Respiratory:  Positive for shortness of breath.     Physical Exam Updated Vital Signs BP 91/77   Pulse (!) 165   Temp 98.6 F (37 C)   Resp (!) 26   SpO2 92%  Physical Exam Vitals and nursing note reviewed.  HENT:     Head: Normocephalic and atraumatic.  Eyes:     Pupils: Pupils are equal, round, and reactive to light.  Cardiovascular:     Rate and Rhythm: Normal rate and regular rhythm.  Pulmonary:     Effort: Pulmonary effort is normal.     Breath sounds: Normal breath sounds.  Abdominal:     Palpations: Abdomen is soft.  Tenderness: There is no abdominal tenderness.  Skin:    General: Skin is warm and dry.  Neurological:     Mental Status: She is alert.  Psychiatric:        Mood and Affect: Mood normal.     ED Results / Procedures / Treatments   Labs (all labs ordered are listed, but only abnormal results are displayed) Labs Reviewed  BASIC METABOLIC PANEL WITH GFR - Abnormal; Notable for the following components:      Result Value   Sodium 132 (*)    Potassium 3.3 (*)    Chloride 93 (*)    Glucose, Bld 110 (*)    Calcium 8.6 (*)    All other components within normal limits  CBC - Abnormal; Notable for the following components:   WBC 11.0 (*)    All other components within  normal limits  BRAIN NATRIURETIC PEPTIDE - Abnormal; Notable for the following components:   B Natriuretic Peptide 349.2 (*)    All other components within normal limits  CULTURE, BLOOD (ROUTINE X 2)  CULTURE, BLOOD (ROUTINE X 2)  RESPIRATORY PANEL BY PCR  PROCALCITONIN  MAGNESIUM   I-STAT CG4 LACTIC ACID, ED  I-STAT CG4 LACTIC ACID, ED    EKG EKG Interpretation Date/Time:  Wednesday November 08 2023 12:00:00 EDT Ventricular Rate:  155 PR Interval:    QRS Duration:  84 QT Interval:  262 QTC Calculation: 420 R Axis:   42  Text Interpretation: Atrial fibrillation with rapid ventricular response Nonspecific ST and T wave abnormality Abnormal ECG When compared with ECG of 08-Nov-2023 11:58, PREVIOUS ECG IS PRESENT Confirmed by Jerald Molly [phone]) on 11/08/2023 12:01:46 PM  Radiology DG Chest 2 View Result Date: 11/08/2023 CLINICAL DATA:  Shortness of breath, cough, pneumonia EXAM: CHEST - 2 VIEW COMPARISON:  05/26/2021 FINDINGS: Severe chronic opacities throughout the lungs bilaterally, most pronounced diffusely throughout the left lung and in the right upper lobe. These findings are stable since prior imaging and most compatible with severe chronic lung disease/fibrosis. No definite acute process. Heart and mediastinal contours within normal limits. No visible effusions. IMPRESSION: Stable severe chronic interstitial lung disease/fibrosis. No definite acute process. Electronically Signed   By: Janeece Mechanic M.D.   On: 11/08/2023 13:31    Procedures Procedures    Medications Ordered in ED Medications  cefTRIAXone  (ROCEPHIN ) 2 g in sodium chloride  0.9 % 100 mL IVPB (has no administration in time range)  azithromycin  (ZITHROMAX ) 500 mg in sodium chloride  0.9 % 250 mL IVPB (has no administration in time range)  Nintedanib  (OFEV ) CAPS 150 mg (has no administration in time range)  sodium chloride  HYPERTONIC 3 % nebulizer solution 4 mL (has no administration in time range)   ipratropium-albuterol  (DUONEB) 0.5-2.5 (3) MG/3ML nebulizer solution 3 mL (has no administration in time range)  enoxaparin  (LOVENOX ) injection 40 mg (has no administration in time range)  sodium chloride  flush (NS) 0.9 % injection 3 mL (has no administration in time range)  acetaminophen  (TYLENOL ) tablet 650 mg (has no administration in time range)    Or  acetaminophen  (TYLENOL ) suppository 650 mg (has no administration in time range)  polyethylene glycol (MIRALAX  / GLYCOLAX ) packet 17 g (has no administration in time range)  cefTRIAXone  (ROCEPHIN ) 1 g in sodium chloride  0.9 % 100 mL IVPB (0 g Intravenous Stopped 11/08/23 1358)  azithromycin  (ZITHROMAX ) 500 mg in sodium chloride  0.9 % 250 mL IVPB (0 mg Intravenous Stopped 11/08/23 1502)  sodium chloride  0.9 % bolus 1,000 mL (  1,000 mLs Intravenous New Bag/Given 11/08/23 1329)  potassium chloride  SA (KLOR-CON  M) CR tablet 40 mEq (40 mEq Oral Given 11/08/23 1600)    ED Course/ Medical Decision Making/ A&P Clinical Course as of 11/08/23 1632  Wed Nov 08, 2023  1418 hosp [MP]    Clinical Course User Index [MP] Sallyanne Creamer, DO                                 Medical Decision Making 83 year old female with history as above presenting from pulmonology office given concern for pneumonia and increased O2 requirement.  Pneumonia seen on outpatient CT chest taken yesterday.  Afebrile and tachycardic here.  Normotensive.  Tachycardia improved after IV fluids.  Will cover with azithromycin  and ceftriaxone  and admit to medicine  Amount and/or Complexity of Data Reviewed Labs: ordered. Radiology: ordered.  Risk Decision regarding hospitalization.           Final Clinical Impression(s) / ED Diagnoses Final diagnoses:  Community acquired pneumonia of right lung, unspecified part of lung    Rx / DC Orders ED Discharge Orders     None         Sallyanne Creamer, DO 11/08/23 1632

## 2023-11-08 NOTE — ED Triage Notes (Signed)
 Pt is here for sob with congestion and cough x1 week.  Pt has 4-5L [place] home O2 at baseline and she has had this increasing sob for which she was seen by pulmonology and she states that it showed pneumonia.  Pt is speaking in full sentences in triage and is on 6L [place] with spo2 93%.  At pulmonologist she was 86% on 5L

## 2023-11-08 NOTE — Telephone Encounter (Signed)
 FYI--Pulmonary Staff spoke with patient's son after this triage for providers recommendation   Patient was last seen in primary care office on ----. Patient's son called Nurse Triage reporting Shortness of breath/cough/malaise. Symptoms began 5 days ago. Interventions attempted: ----. Symptoms are ---- (improving, worsening, stable)   Triage Disposition: Be seen in the next 4 hours for worse than usual symptoms of a longstanding breathing difficulty   Patient/caregiver understands and will follow disposition: Patient's son wants to talk directly to staff and have patient seen in the office today    Patient's son states that he asked his mother if she wanted to go to the hospital and she stated that she did not want to go to the hospital at this time. He states that she trusts Dr Bertrum Brodie and wants to see him. This RN went ahead and looked at the schedule and did not see any openings today. Patient's son is advised of this and he wanted this RN to speak to Dr Bertrum Brodie to see if he could see her today.   Patient's son is getting frustrated. He states that the patient advised him to call Dr Mardell Shade office at this time. She wanted him to get her in to see Dr Bertrum Brodie today. Since the patient already expressed that she was not going to the Emergency Room & the son was persistent on the patient's wishes to be seen by Dr Bertrum Brodie today--This RN called the CAL to advise them of the situation and to advise them that the patient didn't want to go to the Emergency Room. Patient's son connected with CAL at this time      Copied from CRM [id number]. Topic: Clinical - Red Word Triage >> Nov 08, 2023  8:03 AM [name] wrote: Red Word that prompted transfer to Nurse Triage: breathing issues, low energy, not feeling well Reason for Disposition  [1] Longstanding difficulty breathing (e.g., CHF, COPD, emphysema) AND [2] WORSE than normal  Answer Assessment - Initial Assessment Questions E2C2 Pulmonary  Triage - Initial Assessment Questions "Chief Complaint (e.g., cough, sob, wheezing, fever, chills, sweat or additional symptoms) *Go to specific symptom protocol after initial questions. Shortness of Breath, Generalized malaise, Productive cough with yellow phlegm  "How long have symptoms been present?" About 5 days  Have you tested for COVID or Flu? Note: If not, ask patient if a home test can be taken. If so, instruct patient to call back for positive results. No  MEDICINES:   "Have you used any OTC meds to help with symptoms?"  If yes, ask "What medications?" -----  "Have you used your inhalers/maintenance medication?"  If yes, "What medications?" -----  If inhaler, ask "How many puffs and how often?" Note: Review instructions on medication in the chart. Albuterol  [name] states she has been using this  OXYGEN : "Do you wear supplemental oxygen ?" Yes If yes, "How many liters are you supposed to use?" 4-5 Liters  "Do you monitor your oxygen  levels?"  If yes, "What is your reading (oxygen  level) today?" -----  "What is your usual oxygen  saturation reading?"  (Note: Pulmonary O2 sats should be 90% or greater) ------    3. PATTERN "Does the difficult breathing come and go, or has it been constant since it started?"      ----- 4. SEVERITY: "How bad is your breathing?" (e.g., mild, moderate, severe)    - MILD: No SOB at rest, mild SOB with walking, speaks normally in sentences, can lie down, no retractions, pulse < 100.    -  MODERATE: SOB at rest, SOB with minimal exertion and prefers to sit, cannot lie down flat, speaks in phrases, mild retractions, audible wheezing, pulse 100-120.    - SEVERE: Very SOB at rest, speaks in single words, struggling to breathe, sitting hunched forward, retractions, pulse > 120      Son states yes 5. RECURRENT SYMPTOM: "Have you had difficulty breathing before?" If Yes, ask: "When was the last time?" and "What happened that time?"       ----- 6. CARDIAC HISTORY: "Do you have any history of heart disease?" (e.g., heart attack, angina, bypass surgery, angioplasty)      ----- 7. LUNG HISTORY: "Do you have any history of lung disease?"  (e.g., pulmonary embolus, asthma, emphysema)     Significant 8. CAUSE: "What do you think is causing the breathing problem?"      Unknown 9. OTHER SYMPTOMS: "Do you have any other symptoms? (e.g., dizziness, runny nose, cough, chest pain, fever)     Productive cough with yellow phlegm 10. O2 SATURATION MONITOR:  "Do you use an oxygen  saturation monitor (pulse oximeter) at home?" If Yes, ask: "What is your reading (oxygen  level) today?" "What is your usual oxygen  saturation reading?" (e.g., 95%)       -----  Protocols used: Breathing Difficulty-A-AH

## 2023-11-08 NOTE — H&P (Addendum)
 History and Physical   [name]:[id number] DOB: 10/09/1940 DOA: 11/08/2023  PCP: Jimmey Mould, MD   Patient coming from: Home/pulmonologist office  Chief Complaint: Shortness of breath  HPI: [name] is a 83 y.o. female with medical history significant of hypertension, hyperlipidemia, GERD, chronic diastolic CHF, chronic respiratory failure with hypoxia, obesity, asthma, rheumatoid arthritis, pulmonary fibrosis, pulmonary hypertension, bronchiectasis presenting with worsening shortness of breath.  Patient has had worsening shortness of breath and cough for the past week.  Saw pulmonologist today for an acute follow-up visit.  Recent CT done yesterday but not yet read by radiology.  Pulmonologist reviewed this and noted changes consistent with pneumonia with exacerbation of ILD being less likely.  Sent patient to the ED for further treatment and IV antibiotics.  Patient denies fevers, chills, abdominal pain, constipation, diarrhea, nausea, vomiting.  ED Course: Vital signs in the ED notable for blood pressure in the 90s-120s systolic, heart rate in the 110s-160s, respiratory rate in the 20s-30s, requiring 4 to 6 L to maintain saturations.  Lab workup included BMP with sodium 132, potassium 3.2, chloride 93, glucose 110, calcium 8.6.  CBC with leukocytosis to 11.  BNP pending.  Lactic acid pending.  Blood cultures pending.  CT performed yesterday as above.  Chest x-ray showed stable severe ILD.  Patient received ceftriaxone , azithromycin , 1 L IV fluids in the ED.  Review of Systems: As per HPI otherwise all other systems reviewed and are negative.  Past Medical History:  Diagnosis Date   Acute on chronic respiratory failure with hypoxia (HCC) 06/13/2016   Allergic rhinitis    takes Claritin  nightly   Anxiety    but not on any meds   Arthritis    Asthma    Carpal tunnel syndrome    CHF (congestive heart failure) (HCC)    patient denies-in office note 05/2015  by Malen Scudder   GERD (gastroesophageal reflux disease)    History of bronchitis 3-13yrs ago   History of colon polyps    benign   Joint pain    Peripheral edema    takes Lasix  daily   Pneumonitis, hypersensitivity (HCC) 05/2016   chronic hypersensitivity pneumonitis- Dr. Michela Aguas   Pulmonary hypertension (HCC) 06/30/2017   patient denies-in office note from Dr. Michela Aguas-   Shortness of breath dyspnea    with exertion;Albuterol  inhaler as needed   Thyroid  nodule     Past Surgical History:  Procedure Laterality Date   BREAST CYST EXCISION Left 50 yrs ago   CARPELL TUNNELL SURG Right    COLONOSCOPY     ESOPHAGOGASTRODUODENOSCOPY     EYE SURGERY     bilateral cataract surgery with lens implants   KNEE ARTHROSCOPY W/ MENISCAL REPAIR     left   LUNG BIOPSY Right 11/20/2014   Procedure: LUNG BIOPSY;  Surgeon: Zelphia Higashi, MD;  Location: [facility] OR;  Service: Thoracic;  Laterality: Right;   REVERSE SHOULDER ARTHROPLASTY Right 06/07/2018   Procedure: REVERSE SHOULDER ARTHROPLASTY;  Surgeon: Ellard Gunning, MD;  Location: [facility] ORS;  Service: Orthopedics;  Laterality: Right;    RIGHT/LEFT HEART CATH AND CORONARY ANGIOGRAPHY N/A 10/14/2020   Procedure: RIGHT/LEFT HEART CATH AND CORONARY ANGIOGRAPHY;  Surgeon: Darlis Eisenmenger, MD;  Location: [facility] INVASIVE CV LAB;  Service: Cardiovascular;  Laterality: N/A;   TOTAL KNEE ARTHROPLASTY Left 08/13/2013   Procedure: LEFT TOTAL KNEE ARTHROPLASTY;  Surgeon: Bevin Bucks, MD;  Location: [facility] ORS;  Service: Orthopedics;  Laterality: Left;  UMBILICAL HERNIA REPAIR     VIDEO ASSISTED THORACOSCOPY Right 11/20/2014   Procedure: RIGHT  VIDEO ASSISTED THORACOSCOPY WITH WEDGE LUNG BIOPSIES RIGHT UPPER,MIDDLE AND LOWER LOBES ,PLACEMENT OF ON Q PAIN PUMP;  Surgeon: Zelphia Higashi, MD;  Location: [facility] OR;  Service: Thoracic;  Laterality: Right;    Social History  reports that she has never smoked. She has never used smokeless tobacco. She reports that  she does not drink alcohol and does not use drugs.  Allergies  Allergen Reactions   Adhesive [Tape] Itching and Rash   Latex Itching and Swelling   Doxycycline  Rash   Sulfonamide Derivatives Other (See Comments)    Unknown    Family History  Problem Relation Age of Onset   Tuberculosis Father    Tuberculosis Paternal Uncle   Reviewed on admission  Prior to Admission medications   Medication Sig Start Date End Date Taking? Authorizing Provider  aspirin  325 MG tablet Take 650 mg by mouth daily as needed for moderate pain or headache.    [provider]  azithromycin  (ZITHROMAX ) 250 MG tablet Take 2 tablets by mouth on day 1; then 1 tablet daily x 4 additional days Patient not taking: Reported on 11/08/2023 09/15/23   Antonio Baumgarten, NP  Cholecalciferol (DIALYVITE VITAMIN D 5000) 125 MCG (5000 UT) capsule Take 5,000 Units by mouth daily.    [provider]  fluticasone  (FLONASE ) 50 MCG/ACT nasal spray Place 1 spray into both nostrils daily as needed for allergies.     [provider]  furosemide  (LASIX ) 20 MG tablet as needed. 09/30/13   [provider]  ibuprofen  (ADVIL ) 200 MG tablet Take 200-400 mg by mouth every 6 (six) hours as needed for headache or moderate pain.    [provider]  loratadine  (CLARITIN ) 5 MG chewable tablet Chew 5-10 mg by mouth daily as needed for allergies.     [provider]  Multiple Vitamin (MULTIVITAMIN WITH MINERALS) TABS tablet Take 1 tablet by mouth daily.    [provider]  Nintedanib  (OFEV ) 150 MG CAPS Take 1 capsule (150 mg total) by mouth 2 (two) times daily. 02/15/23   Maire Scot, MD  Respiratory Therapy Supplies (FLUTTER) DEVI Use as directed. 12/28/17   Marine Sia, MD  sodium chloride  HYPERTONIC 3 % nebulizer solution Take by nebulization in the morning and at bedtime. Dx J47.1 03/10/23   Maire Scot, MD  VENTOLIN  HFA 108 (90 Base) MCG/ACT inhaler Inhale 2 puffs into  the lungs every 6 (six) hours as needed for wheezing or shortness of breath. 03/10/23   Maire Scot, MD  vitamin B-12 (CYANOCOBALAMIN) 1000 MCG tablet Take 1,000 mcg by mouth every other day.    [provider]    Physical Exam: Vitals:   11/08/23 1151  BP: 91/77  Pulse: (!) 165  Resp: (!) 26  Temp: 98.6 F (37 C)  SpO2: 92%    Physical Exam Constitutional:      General: She is not in acute distress.    Appearance: Normal appearance.  HENT:     Head: Normocephalic and atraumatic.     Mouth/Throat:     Mouth: Mucous membranes are moist.     Pharynx: Oropharynx is clear.  Eyes:     Extraocular Movements: Extraocular movements intact.     Pupils: Pupils are equal, round, and reactive to light.  Cardiovascular:     Rate and Rhythm: Regular rhythm. Tachycardia present.     Pulses: Normal pulses.  Heart sounds: Normal heart sounds.  Pulmonary:     Effort: Pulmonary effort is normal. Tachypnea present. No respiratory distress.     Breath sounds: Rales present.  Abdominal:     General: Bowel sounds are normal. There is no distension.     Palpations: Abdomen is soft.     Tenderness: There is no abdominal tenderness.  Musculoskeletal:        General: No swelling or deformity.  Skin:    General: Skin is warm and dry.  Neurological:     General: No focal deficit present.     Mental Status: Mental status is at baseline.    Labs on Admission: I have personally reviewed following labs and imaging studies  CBC: Recent Labs  Lab 11/08/23 1155  WBC 11.0*  HGB 13.8  HCT 42.9  MCV 91.9  PLT 284    Basic Metabolic Panel: Recent Labs  Lab 11/08/23 1155  NA 132*  K 3.3*  CL 93*  CO2 25  GLUCOSE 110*  BUN 15  CREATININE 0.68  CALCIUM 8.6*    GFR: Estimated Creatinine Clearance: 55.9 mL/min (by C-G formula based on SCr of 0.68 mg/dL).  Liver Function Tests: No results for input(s): "AST", "ALT", "ALKPHOS", "BILITOT", "PROT", "ALBUMIN" in the last  168 hours.  Urine analysis:    Component Value Date/Time   COLORURINE YELLOW 11/18/2014 1257   APPEARANCEUR CLEAR 11/18/2014 1257   LABSPEC 1.008 11/18/2014 1257   PHURINE 6.5 11/18/2014 1257   GLUCOSEU NEGATIVE 11/18/2014 1257   GLUCOSEU NEGATIVE 07/09/2007 0000   HGBUR NEGATIVE 11/18/2014 1257   BILIRUBINUR NEGATIVE 11/18/2014 1257   KETONESUR NEGATIVE 11/18/2014 1257   PROTEINUR NEGATIVE 11/18/2014 1257   UROBILINOGEN 0.2 11/18/2014 1257   NITRITE NEGATIVE 11/18/2014 1257   LEUKOCYTESUR SMALL (A) 11/18/2014 1257    Radiological Exams on Admission: DG Chest 2 View Result Date: 11/08/2023 CLINICAL DATA:  Shortness of breath, cough, pneumonia EXAM: CHEST - 2 VIEW COMPARISON:  05/26/2021 FINDINGS: Severe chronic opacities throughout the lungs bilaterally, most pronounced diffusely throughout the left lung and in the right upper lobe. These findings are stable since prior imaging and most compatible with severe chronic lung disease/fibrosis. No definite acute process. Heart and mediastinal contours within normal limits. No visible effusions. IMPRESSION: Stable severe chronic interstitial lung disease/fibrosis. No definite acute process. Electronically Signed   By: Janeece Mechanic M.D.   On: 11/08/2023 13:31    EKG: Independently reviewed.  Atrial relation with RVR at 155 bpm with nonspecific T wave changes.   Rate has since improved to the 100s to 110s and sinus tachycardia on the monitor, but no repeat EKG.  Assessment/Plan Active Problems:   HLD (hyperlipidemia)   GERD   Obesity   HTN (hypertension)   Interstitial lung disease (HCC)   Bronchiectasis with (acute) exacerbation (HCC)   Rheumatoid arthritis (HCC)   Pulmonary fibrosis (HCC)   Pulmonary hypertension (HCC)   Diastolic CHF (HCC)   Chronic respiratory failure with hypoxia (HCC)   Severe persistent asthma, uncomplicated   Acute on chronic respiratory failure with hypoxia (HCC)   Acute on chronic respiratory failure  with hypoxia Pneumonia (bacterial versus viral) ILD/pulmonary fibrosis secondary to hypersensitivity pneumonitis, Rule out exacerbation History of asthma, bronchiectasis, pulmonary hypertension > Patient has had a 1 week of shortness of breath and cough that has been worsening. > Saw her pulmonologist for an acute visit today secondary to this and was found to be hypoxic. > Pulmonologist reviewed CT scan which was  performed yesterday and noted changes consistent with pneumonia being favored over exacerbation.  Not read by radiology yet. > Patient started on ceftriaxone , azithromycin  in the ED.  Also received a liter of fluids.  Heart rate improving.  CBC with mild leukocytosis of 11.  Chest x-ray showed no acute abnormality. - Monitor in progressive unit overnight - Continue supplemental oxygen , wean as tolerated - Continue with ceftriaxone  and azithromycin  - Check RVP and procalcitonin to rule out possible viral etiology as WBC is borderline. - Trend fever curve and WBC - Follow-up blood cultures - As needed DuoNebs - Continue home nintedanib   Hypokalemia > Incidental K 3.3 - 40 mEq PO - Check Mg  Hypertension - Confirm home regimen, only as needed Lasix  noted  Hyperlipidemia - Confirm home regimen  GERD - Confirm home regimen  Chronic diastolic CHF > Last echo was in January 2024 with EF 55-60%, G1 DD, normal RV function, evidence of pulmonary hypertension. > On Lasix  as needed only. - Continue to monitor  Obesity - Noted  DVT prophylaxis: Lovenox  Code Status:   Full Family Communication:  Updated at bedside  Disposition Plan:   Patient is from:  Home  Anticipated DC to:  Home  Anticipated DC date:  1 to 3 days  Anticipated DC barriers: None  Consults called:  None Admission status:  Observation, progressive  Severity of Illness: The appropriate patient status for this patient is OBSERVATION. Observation status is judged to be reasonable and necessary in order to  provide the required intensity of service to ensure the patient's safety. The patient's presenting symptoms, physical exam findings, and initial radiographic and laboratory data in the context of their medical condition is felt to place them at decreased risk for further clinical deterioration. Furthermore, it is anticipated that the patient will be medically stable for discharge from the hospital within 2 midnights of admission.    Johnetta Nab MD [facility]  How to contact the [facility] Attending or Consulting provider 7A - 7P or covering provider during after hours 7P -7A, for this patient?   Check the care team in [facility] and look for a) attending/consulting [facility] provider listed and b) the [facility] team listed Log into [email or web address] and use [place] universal password to access. If you do not have the password, please contact the hospital operator. Locate the [facility] provider you are looking for under [facility] and page to a number that you can be directly reached. If you still have difficulty reaching the provider, please page the [facility] (Director on Call) for the Hospitalists listed on amion for assistance.  11/08/2023, 2:27 PM

## 2023-11-08 NOTE — Progress Notes (Signed)
 Patient in a-fib w/RVR HR 130-150's sustained. No history of a-fib. EKG done and MD paged. Awaiting orders.

## 2023-11-08 NOTE — Telephone Encounter (Addendum)
 Called and spoke with pts son. There is acute opening in [place] and pts son wanted to schedule appt. Sent front staff a message to schedule pt for 6/4 at 10:15. Pts son verbalized understanding that this appointment was not going to be with Dr. Bertrum Brodie and he was OK with pt being seen somewhere else.   Please advise and schedule pt appt pts son is aware of appt

## 2023-11-09 DIAGNOSIS — J8410 Pulmonary fibrosis, unspecified: Secondary | ICD-10-CM | POA: Diagnosis present

## 2023-11-09 DIAGNOSIS — E876 Hypokalemia: Secondary | ICD-10-CM | POA: Diagnosis present

## 2023-11-09 DIAGNOSIS — J471 Bronchiectasis with (acute) exacerbation: Secondary | ICD-10-CM | POA: Diagnosis present

## 2023-11-09 DIAGNOSIS — J4550 Severe persistent asthma, uncomplicated: Secondary | ICD-10-CM | POA: Diagnosis present

## 2023-11-09 DIAGNOSIS — Z9981 Dependence on supplemental oxygen: Secondary | ICD-10-CM | POA: Diagnosis not present

## 2023-11-09 DIAGNOSIS — I272 Pulmonary hypertension, unspecified: Secondary | ICD-10-CM | POA: Diagnosis present

## 2023-11-09 DIAGNOSIS — R7881 Bacteremia: Secondary | ICD-10-CM | POA: Diagnosis present

## 2023-11-09 DIAGNOSIS — J849 Interstitial pulmonary disease, unspecified: Secondary | ICD-10-CM | POA: Diagnosis not present

## 2023-11-09 DIAGNOSIS — J9611 Chronic respiratory failure with hypoxia: Secondary | ICD-10-CM | POA: Diagnosis not present

## 2023-11-09 DIAGNOSIS — B348 Other viral infections of unspecified site: Secondary | ICD-10-CM | POA: Insufficient documentation

## 2023-11-09 DIAGNOSIS — I4892 Unspecified atrial flutter: Secondary | ICD-10-CM | POA: Diagnosis present

## 2023-11-09 DIAGNOSIS — Z515 Encounter for palliative care: Secondary | ICD-10-CM | POA: Diagnosis not present

## 2023-11-09 DIAGNOSIS — Z66 Do not resuscitate: Secondary | ICD-10-CM | POA: Diagnosis not present

## 2023-11-09 DIAGNOSIS — J1289 Other viral pneumonia: Secondary | ICD-10-CM | POA: Diagnosis present

## 2023-11-09 DIAGNOSIS — J679 Hypersensitivity pneumonitis due to unspecified organic dust: Secondary | ICD-10-CM | POA: Diagnosis present

## 2023-11-09 DIAGNOSIS — J470 Bronchiectasis with acute lower respiratory infection: Secondary | ICD-10-CM | POA: Diagnosis present

## 2023-11-09 DIAGNOSIS — J189 Pneumonia, unspecified organism: Secondary | ICD-10-CM | POA: Diagnosis not present

## 2023-11-09 DIAGNOSIS — K219 Gastro-esophageal reflux disease without esophagitis: Secondary | ICD-10-CM | POA: Diagnosis present

## 2023-11-09 DIAGNOSIS — J13 Pneumonia due to Streptococcus pneumoniae: Secondary | ICD-10-CM | POA: Diagnosis present

## 2023-11-09 DIAGNOSIS — I480 Paroxysmal atrial fibrillation: Secondary | ICD-10-CM | POA: Diagnosis present

## 2023-11-09 DIAGNOSIS — I5032 Chronic diastolic (congestive) heart failure: Secondary | ICD-10-CM

## 2023-11-09 DIAGNOSIS — I4891 Unspecified atrial fibrillation: Secondary | ICD-10-CM | POA: Diagnosis not present

## 2023-11-09 DIAGNOSIS — I110 Hypertensive heart disease with heart failure: Secondary | ICD-10-CM | POA: Diagnosis present

## 2023-11-09 DIAGNOSIS — B953 Streptococcus pneumoniae as the cause of diseases classified elsewhere: Secondary | ICD-10-CM | POA: Diagnosis present

## 2023-11-09 DIAGNOSIS — J9621 Acute and chronic respiratory failure with hypoxia: Secondary | ICD-10-CM | POA: Diagnosis present

## 2023-11-09 DIAGNOSIS — M069 Rheumatoid arthritis, unspecified: Secondary | ICD-10-CM | POA: Diagnosis present

## 2023-11-09 DIAGNOSIS — Z7189 Other specified counseling: Secondary | ICD-10-CM | POA: Diagnosis not present

## 2023-11-09 DIAGNOSIS — E785 Hyperlipidemia, unspecified: Secondary | ICD-10-CM | POA: Diagnosis present

## 2023-11-09 DIAGNOSIS — A403 Sepsis due to Streptococcus pneumoniae: Secondary | ICD-10-CM | POA: Diagnosis present

## 2023-11-09 DIAGNOSIS — E669 Obesity, unspecified: Secondary | ICD-10-CM | POA: Diagnosis present

## 2023-11-09 LAB — BLOOD CULTURE ID PANEL (REFLEXED) - BCID2

## 2023-11-09 LAB — COMPREHENSIVE METABOLIC PANEL WITH GFR
ALT: 12 U/L (ref 0–44)
AST: 25 U/L (ref 15–41)
Albumin: 2 g/dL — ABNORMAL LOW (ref 3.5–5.0)
Alkaline Phosphatase: 67 U/L (ref 38–126)
Anion gap: 13 (ref 5–15)
BUN: 14 mg/dL (ref 8–23)
CO2: 23 mmol/L (ref 22–32)
Calcium: 8.5 mg/dL — ABNORMAL LOW (ref 8.9–10.3)
Chloride: 99 mmol/L (ref 98–111)
Creatinine, Ser: 0.79 mg/dL (ref 0.44–1.00)
GFR, Estimated: >60 mL/min (ref >60)
Glucose, Bld: 83 mg/dL (ref 70–99)
Potassium: 3.7 mmol/L (ref 3.5–5.1)
Sodium: 135 mmol/L (ref 135–145)
Total Bilirubin: 1.9 mg/dL — ABNORMAL HIGH (ref 0.0–1.2)
Total Protein: 6 g/dL — ABNORMAL LOW (ref 6.5–8.1)

## 2023-11-09 LAB — CBC
HCT: 40.8 % (ref 36.0–46.0)
Hemoglobin: 13.1 g/dL (ref 12.0–15.0)
MCH: 29.8 pg (ref 26.0–34.0)
MCHC: 32.1 g/dL (ref 30.0–36.0)
MCV: 92.7 fL (ref 80.0–100.0)
Platelets: 297 10*3/uL (ref 150–400)
RBC: 4.4 MIL/uL (ref 3.87–5.11)
RDW: 14.4 % (ref 11.5–15.5)
WBC: 13 10*3/uL — ABNORMAL HIGH (ref 4.0–10.5)
nRBC: 0 % (ref 0.0–0.2)

## 2023-11-09 LAB — PROCALCITONIN: Procalcitonin: 2.01 ng/mL

## 2023-11-09 MED ORDER — LEVALBUTEROL HCL 0.63 MG/3ML IN NEBU
0.6300 mg | INHALATION_SOLUTION | RESPIRATORY_TRACT | Status: DC | PRN
  Administered 2023-11-09 – 2023-11-11 (×4): 0.63 mg via RESPIRATORY_TRACT
  Filled 2023-11-09 (×5): qty 3

## 2023-11-09 MED ORDER — NINTEDANIB ESYLATE 150 MG PO CAPS
1.0000 | ORAL_CAPSULE | Freq: Two times a day (BID) | ORAL | Status: DC
  Administered 2023-11-09 – 2023-11-13 (×9): 150 mg via ORAL
  Filled 2023-11-09 (×15): qty 1

## 2023-11-09 NOTE — Progress Notes (Signed)
 PHARMACY - PHYSICIAN COMMUNICATION CRITICAL VALUE ALERT - BLOOD CULTURE IDENTIFICATION (BCID)  [name] is an 83 y.o. female who presented to [facility] on 11/08/2023 with a chief complaint of worsening shortness of breath  Assessment:  2/3 blood cultures with strep pneumo (same set)  Name of physician (or Provider) Contacted: Dr. Achilles Holes  Current antibiotics: Ceftriaxone  2g IV every 24 hours + azithromycin  500mg  IV every 24 hours  Changes to prescribed antibiotics recommended:   -Continue current regimen  Results for orders placed or performed during the hospital encounter of 11/08/23  Blood Culture ID Panel (Reflexed) (Collected: 11/08/2023 12:35 PM)  Result Value Ref Range   Enterococcus faecalis NOT DETECTED NOT DETECTED   Enterococcus Faecium NOT DETECTED NOT DETECTED   Listeria monocytogenes NOT DETECTED NOT DETECTED   Staphylococcus species NOT DETECTED NOT DETECTED   Staphylococcus aureus (BCID) NOT DETECTED NOT DETECTED   Staphylococcus epidermidis NOT DETECTED NOT DETECTED   Staphylococcus lugdunensis NOT DETECTED NOT DETECTED   Streptococcus species DETECTED (A) NOT DETECTED   Streptococcus agalactiae NOT DETECTED NOT DETECTED   Streptococcus pneumoniae DETECTED (A) NOT DETECTED   Streptococcus pyogenes NOT DETECTED NOT DETECTED   A.calcoaceticus-baumannii NOT DETECTED NOT DETECTED   Bacteroides fragilis NOT DETECTED NOT DETECTED   Enterobacterales NOT DETECTED NOT DETECTED   Enterobacter cloacae complex NOT DETECTED NOT DETECTED   Escherichia coli NOT DETECTED NOT DETECTED   Klebsiella aerogenes NOT DETECTED NOT DETECTED   Klebsiella oxytoca NOT DETECTED NOT DETECTED   Klebsiella pneumoniae NOT DETECTED NOT DETECTED   Proteus species NOT DETECTED NOT DETECTED   Salmonella species NOT DETECTED NOT DETECTED   Serratia marcescens NOT DETECTED NOT DETECTED   Haemophilus influenzae NOT DETECTED NOT DETECTED   Neisseria meningitidis NOT DETECTED NOT DETECTED    Pseudomonas aeruginosa NOT DETECTED NOT DETECTED   Stenotrophomonas maltophilia NOT DETECTED NOT DETECTED   Candida albicans NOT DETECTED NOT DETECTED   Candida auris NOT DETECTED NOT DETECTED   Candida glabrata NOT DETECTED NOT DETECTED   Candida krusei NOT DETECTED NOT DETECTED   Candida parapsilosis NOT DETECTED NOT DETECTED   Candida tropicalis NOT DETECTED NOT DETECTED   Cryptococcus neoformans/gattii NOT DETECTED NOT DETECTED    Young Hensen, PharmD, BCPS Clinical Pharmacist 11/09/2023 6:42 AM

## 2023-11-09 NOTE — Progress Notes (Signed)
 Pharmacy Note - OFEV   Pt taking Ofev  (nintedanib ) 150 mg BID PTA - ordered while inpatient.  No AKI, no GI symptoms, and no LFT elevation. Discussed with Dr. Bertrum Brodie - granted authorization for inpatient use.   Joanell Mowers, Davey Erp, BCCP Clinical Pharmacist  11/09/2023 12:10 PM   [facility] pharmacy phone numbers are listed on [email or web address]

## 2023-11-09 NOTE — Plan of Care (Signed)

## 2023-11-09 NOTE — TOC CM/SW Note (Signed)
 Transition of Care [facility]) - Inpatient Brief Assessment   Patient Details  Name: [name] MRN: [id number] Date of Birth: 02-29-1941  Transition of Care [facility]) CM/SW Contact:    Juliane Och, LCSW Phone Number: 11/09/2023, 9:10 AM   Clinical Narrative:  9:10 AM Per chart review, patient resides at home alone. Patient has a PCP and insurance. Patient does not have SNF history. Patient has HH history with [facility] and [facility]. Patient has DME history (oxygen , rolling walker, 3 in 1) with [facility] and [facility]. No TOC needs were identified at this time. TOC will continue to follow and be available to assist.  Transition of Care Asessment: Insurance and Status: Insurance coverage has been reviewed Patient has primary care physician: Yes Home environment has been reviewed: Private Residence Prior level of function:: N/A Prior/Current Home Services: No current home services (Has HH/DME history) Social Drivers of Health Review: SDOH reviewed no interventions necessary Readmission risk has been reviewed: Yes Transition of care needs: no transition of care needs at this time

## 2023-11-09 NOTE — Progress Notes (Signed)
 Progress Note   Patient: [name]:[id number] DOB: [birth year] DOA: 11/08/2023     0 DOS: the patient was seen and examined on 11/09/2023   Brief hospital course: [name] is a 83 y.o. female with medical history significant of hypertension, hyperlipidemia, GERD, chronic diastolic CHF, chronic respiratory failure with hypoxia, obesity, asthma, rheumatoid arthritis, pulmonary fibrosis, pulmonary hypertension, bronchiectasis presenting with worsening shortness of breath seen by pulmonologist advised inpatient admission for pneumonia, ILD flare up management.  Assessment and Plan: Acute on chronic hypoxic respiratory failure Bacterial pneumonia ILD/ pulmonary fibrosis exacerbation Rhinovirus, enterovirus positive- Patient is presentation with worsening shortness of breath, productive cough, elevated white count, Pro-Cal. Respiratory viral panel positive for Rhino/ enterovirus. Blood culture ID came back positive for staph and strep Continue to monitor patient in progressive care unit. Patient will be continued on IV Rocephin , azithromycin . Continue supplemental oxygen , she is down to her home O2 4 L. Continue bronchodilator therapy. Patient  and daughter interested in palliative care. They also would like her in Hospital at home program. Dr Daisey Dryer discussed with patient and her daughter at bedside. PT/ OT evaluation called.  Hypokalemia Replaced.  Chronic respiratory failure on oxygen : Continue home oxygen  and adjust accordingly.   Chronic diastolic CHF Last echo was in January 2024 with EF 55-60%, G1 DD, normal RV function, evidence of pulmonary hypertension. She takes Lasix  20mg  as needed at home. Will hold diuretic for now as she is eating poor.      Out of bed to chair. Incentive spirometry. Nursing supportive care. Fall, aspiration precautions. Diet:  Diet Orders (From admission, onward)     Start     Ordered   11/08/23 1416  Diet Heart Room  service appropriate? Yes; Fluid consistency: Thin; Fluid restriction: 2000 mL Fluid  Diet effective now       Question Answer Comment  Room service appropriate? Yes   Fluid consistency: Thin   Fluid restriction: 2000 mL Fluid      11/08/23 1427           DVT prophylaxis: enoxaparin  (LOVENOX ) injection 40 mg Start: 11/08/23 2200  Level of care: Progressive   Code Status: Full Code  Subjective: Patient is seen and examined today morning. She is lying in bed, has respiratory distress while talking. Daughter at bedside. She has rhochorous breath sounds. Has productive cough. Weak, eating poor.  Physical Exam: Vitals:   11/08/23 2249 11/08/23 2300 11/09/23 0344 11/09/23 0747  BP: (!) 144/64 114/75 (!) 132/51 (!) 133/58  Pulse: (!) 133 (!) 135 (!) 146 88  Resp: (!) 25 (!) 24 19 20   Temp: 98.3 F (36.8 C)  98.2 F (36.8 C) 97.8 F (36.6 C)  TempSrc: Oral  Oral Oral  SpO2: 94% 94% 96% 96%  Weight:      Height:        General - Elderly ill Caucasian female, moderate respiratory distress HEENT - PERRLA, EOMI, atraumatic head, non tender sinuses. Lung - Clear, diffuse rales, rhonchi, wheezes, using accessory muscles. Heart - S1, S2 heard, no murmurs, rubs, trace pedal edema. Abdomen - Soft, non tender, bowel sounds good Neuro - Alert, awake and oriented x 3, non focal exam. Skin - Warm and dry.  Data Reviewed:      Latest Ref Rng & Units 11/09/2023    2:27 AM 11/08/2023   11:55 AM 09/26/2022    4:49 PM  CBC  WBC 4.0 - 10.5 K/uL 13.0  11.0  10.9   Hemoglobin 12.0 -  15.0 g/dL 40.9  81.1  91.4   Hematocrit 36.0 - 46.0 % 40.8  42.9  42.9   Platelets 150 - 400 K/uL 297  284  334.0       Latest Ref Rng & Units 11/09/2023    2:27 AM 11/08/2023   11:55 AM 05/17/2022   12:02 PM  BMP  Glucose 70 - 99 mg/dL 83  782  78   BUN 8 - 23 mg/dL 14  15  9    Creatinine 0.44 - 1.00 mg/dL 9.56  2.13  0.86   Sodium 135 - 145 mmol/L 135  132  140   Potassium 3.5 - 5.1 mmol/L 3.7  3.3  4.0    Chloride 98 - 111 mmol/L 99  93  100   CO2 22 - 32 mmol/L 23  25  35   Calcium 8.9 - 10.3 mg/dL 8.5  8.6  9.3    DG Chest 2 View Result Date: 11/08/2023 CLINICAL DATA:  Shortness of breath, cough, pneumonia EXAM: CHEST - 2 VIEW COMPARISON:  05/26/2021 FINDINGS: Severe chronic opacities throughout the lungs bilaterally, most pronounced diffusely throughout the left lung and in the right upper lobe. These findings are stable since prior imaging and most compatible with severe chronic lung disease/fibrosis. No definite acute process. Heart and mediastinal contours within normal limits. No visible effusions. IMPRESSION: Stable severe chronic interstitial lung disease/fibrosis. No definite acute process. Electronically Signed   By: Janeece Mechanic M.D.   On: 11/08/2023 13:31    Family Communication: Discussed with patient, daughter at bedside. They understand and agree. All questions answered. They are interested in Hospital at home program.  Disposition: Status is: Inpatient Remains inpatient appropriate because: bacterial pneumonia treatment, IV antibiotics, nebs, supplemental O2  Planned Discharge Destination: Hospital at home     MDM level 3- Patient is hypoxic, sick with bacterial pneumonia, viral illness, ILD flare up. Given her advanced age, multiple comorbidities. She is at high risk for sudden clinical deterioration.  Author: Aisha Hove, MD 11/09/2023 11:11 AM Secure chat 7am to 7pm For on call review [email or web address].

## 2023-11-09 NOTE — Consult Note (Signed)
 [facility]    Date of Admission:  11/08/2023     Total days of antibiotics 2               Reason for Consult: Streptococcus pneumoniae bacteremia Referring Provider: Dr. Daisey Dryer Primary Care Provider: Jimmey Mould, MD   ASSESSMENT:  [name] is an 83 y/o caucasian female with history of interstitial lung disease/fibrosis on chronic home oxygen  at 4-5 L [place] presenting with worsening shortness of breath and CT imaging concerning for pneumonia and found to have Streptococcus pneumoniae bacteremia and rhinovirus/enterovirus. Narrow antibiotics to ceftriaxone  and discontinue azithromycin . Continue supportive care for rhinovirus/enterovirus. Oxygen  being weaned back to baseline. Droplet precautions. Monitor blood cultures for sensitivities. Remaining medical and supportive care per Internal Medicine.   PLAN:  Narrow antibiotics to ceftriaxone  in the setting of Streptococcus pneumonia bacteremia Supportive care for rhinovirus/enterovirus.  Monitor cultures for sensitivities Droplet precautions. Remaining medical and supportive care per Internal Medicine.    Principal Problem:   Acute on chronic respiratory failure with hypoxia (HCC) Active Problems:   Bacteremia due to Streptococcus pneumoniae   Rhinovirus   HLD (hyperlipidemia)   GERD   Obesity   HTN (hypertension)   Interstitial lung disease (HCC)   Bronchiectasis with (acute) exacerbation (HCC)   Rheumatoid arthritis (HCC)   Pulmonary fibrosis (HCC)   Pulmonary hypertension (HCC)   Diastolic CHF (HCC)   Chronic respiratory failure with hypoxia (HCC)   Severe persistent asthma, uncomplicated    enoxaparin  (LOVENOX ) injection  40 mg Subcutaneous Q24H   Nintedanib   1 capsule Oral BID   sodium chloride  flush  3 mL Intravenous Q12H   sodium chloride  HYPERTONIC  4 mL Nebulization BID     HPI: [name] is a 83 y.o. female with previous medical history of rheumatoid  arthritis, pulmonary fibrosis, pulmonary hypertension, bronchiectasis, diastolic heard failure and hypertension presenting with increasing shortness of breath.  [name] is on 4-5 L of oxygen  via nasal cannula at baseline. Seen in pulmonology office on 11/08/23 with shortness of breath, cough and sputum production over the past month and found to have hypoxia. Chest CT with consolidative opacities concerning for superimposed infection/bacterial pneumonia versus flare of interstitial lung disease. Chest x-ray 11/09/23 with stable severe chronic interstitial lung disease/fibrosis. Afebrile with leukocytosis of 13,000. Respiratory virus panel positive for rhinovirus/enterovirus. Blood cultures positive for Streptococcus pneumoniae bacteremia. Started on ceftriaxone  and azithromycin . ID has been asked for antibiotic recommendations.   [name]'s daughter is present during visit and provides a part of the history. Previously completed two course of azithromycin  with the last being in April. Believes she may have received another antibiotic recently but unsure. Worsening shortness of breath started about 5 days ago. No fevers at home.    Review of Systems: Review of Systems  Constitutional:  Negative for chills, fever and weight loss.  Respiratory:  Positive for cough, sputum production and shortness of breath. Negative for wheezing.   Cardiovascular:  Negative for chest pain and leg swelling.  Gastrointestinal:  Negative for abdominal pain, constipation, diarrhea, nausea and vomiting.  Skin:  Negative for rash.     Past Medical History:  Diagnosis Date   Acute on chronic respiratory failure with hypoxia (HCC) 06/13/2016   Allergic rhinitis    takes Claritin  nightly   Anxiety    but not on any meds   Arthritis    Asthma    Carpal tunnel syndrome    CHF (  congestive heart failure) (HCC)    patient denies-in office note 05/2015 by Malen Scudder   GERD (gastroesophageal reflux disease)     History of bronchitis 3-70yrs ago   History of colon polyps    benign   Joint pain    Peripheral edema    takes Lasix  daily   Pneumonitis, hypersensitivity (HCC) 05/2016   chronic hypersensitivity pneumonitis- Dr. Michela Aguas   Pulmonary hypertension (HCC) 06/30/2017   patient denies-in office note from Dr. Michela Aguas-   Shortness of breath dyspnea    with exertion;Albuterol  inhaler as needed   Thyroid  nodule     Social History   Tobacco Use   Smoking status: Never   Smokeless tobacco: Never  Vaping Use   Vaping status: Never Used  Substance Use Topics   Alcohol use: No    Alcohol/week: 0.0 standard drinks of alcohol   Drug use: No    Family History  Problem Relation Age of Onset   Tuberculosis Father    Tuberculosis Paternal Uncle     Allergies  Allergen Reactions   Adhesive [Tape] Itching and Rash   Latex Itching and Swelling   Doxycycline  Rash   Sulfonamide Derivatives Rash and Other (See Comments)    OBJECTIVE: Blood pressure 129/67, pulse 88, temperature 98.7 F (37.1 C), temperature source Oral, resp. rate 16, height 5\' 5"  (1.651 m), weight 75.6 kg, SpO2 96%.  Physical Exam Constitutional:      General: She is not in acute distress.    Appearance: She is well-developed.     Comments: Seated in the chair beside the bed; pleasant  Cardiovascular:     Rate and Rhythm: Normal rate and regular rhythm.     Heart sounds: Normal heart sounds.  Pulmonary:     Effort: Pulmonary effort is normal.     Breath sounds: Rhonchi present.  Skin:    General: Skin is warm and dry.  Neurological:     Mental Status: She is alert.     Lab Results Lab Results  Component Value Date   WBC 13.0 (H) 11/09/2023   HGB 13.1 11/09/2023   HCT 40.8 11/09/2023   MCV 92.7 11/09/2023   PLT 297 11/09/2023    Lab Results  Component Value Date   CREATININE 0.79 11/09/2023   BUN 14 11/09/2023   NA 135 11/09/2023   K 3.7 11/09/2023   CL 99 11/09/2023   CO2 23 11/09/2023    Lab  Results  Component Value Date   ALT 12 11/09/2023   AST 25 11/09/2023   ALKPHOS 67 11/09/2023   BILITOT 1.9 (H) 11/09/2023     Microbiology: Recent Results (from the past 240 hours)  Culture, blood (routine x 2)     Status: None (Preliminary result)   Collection Time: 11/08/23 12:35 PM   Specimen: BLOOD  Result Value Ref Range Status   Specimen Description BLOOD SITE NOT SPECIFIED  Final   Special Requests   Final    BOTTLES DRAWN AEROBIC AND ANAEROBIC Blood Culture results may not be optimal due to an inadequate volume of blood received in culture bottles   Culture  Setup Time   Final    GRAM POSITIVE COCCI IN BOTH AEROBIC AND ANAEROBIC BOTTLES CRITICAL RESULT CALLED TO, READ BACK BY AND VERIFIED WITH: J [facility]  11/09/23 MK Performed at [facility], [address], [place], [place] [address]    Culture [facility] POSITIVE COCCI  Final   Report Status PENDING  Incomplete  Blood Culture ID  Panel (Reflexed)     Status: Abnormal   Collection Time: 11/08/23 12:35 PM  Result Value Ref Range Status   Enterococcus faecalis NOT DETECTED NOT DETECTED Final   Enterococcus Faecium NOT DETECTED NOT DETECTED Final   Listeria monocytogenes NOT DETECTED NOT DETECTED Final   Staphylococcus species NOT DETECTED NOT DETECTED Final   Staphylococcus aureus (BCID) NOT DETECTED NOT DETECTED Final   Staphylococcus epidermidis NOT DETECTED NOT DETECTED Final   Staphylococcus lugdunensis NOT DETECTED NOT DETECTED Final   Streptococcus species DETECTED (A) NOT DETECTED Final    Comment: CRITICAL RESULT CALLED TO, READ BACK BY AND VERIFIED WITH: J WYLAND,PHARMD@0640  11/09/23 MK    Streptococcus agalactiae NOT DETECTED NOT DETECTED Final   Streptococcus pneumoniae DETECTED (A) NOT DETECTED Final    Comment: CRITICAL RESULT CALLED TO, READ BACK BY AND VERIFIED WITH: J WYLAND,PHARMD@0640  11/09/23 MK    Streptococcus pyogenes NOT DETECTED NOT DETECTED Final   A.calcoaceticus-baumannii NOT  DETECTED NOT DETECTED Final   Bacteroides fragilis NOT DETECTED NOT DETECTED Final   Enterobacterales NOT DETECTED NOT DETECTED Final   Enterobacter cloacae complex NOT DETECTED NOT DETECTED Final   Escherichia coli NOT DETECTED NOT DETECTED Final   Klebsiella aerogenes NOT DETECTED NOT DETECTED Final   Klebsiella oxytoca NOT DETECTED NOT DETECTED Final   Klebsiella pneumoniae NOT DETECTED NOT DETECTED Final   Proteus species NOT DETECTED NOT DETECTED Final   Salmonella species NOT DETECTED NOT DETECTED Final   Serratia marcescens NOT DETECTED NOT DETECTED Final   Haemophilus influenzae NOT DETECTED NOT DETECTED Final   Neisseria meningitidis NOT DETECTED NOT DETECTED Final   Pseudomonas aeruginosa NOT DETECTED NOT DETECTED Final   Stenotrophomonas maltophilia NOT DETECTED NOT DETECTED Final   Candida albicans NOT DETECTED NOT DETECTED Final   Candida auris NOT DETECTED NOT DETECTED Final   Candida glabrata NOT DETECTED NOT DETECTED Final   Candida krusei NOT DETECTED NOT DETECTED Final   Candida parapsilosis NOT DETECTED NOT DETECTED Final   Candida tropicalis NOT DETECTED NOT DETECTED Final   Cryptococcus neoformans/gattii NOT DETECTED NOT DETECTED Final    Comment: Performed at [facility], [address] [place], [place] [address]  Respiratory (~20 pathogens) panel by PCR     Status: Abnormal   Collection Time: 11/08/23  2:16 PM   Specimen: Nasopharyngeal Swab; Respiratory  Result Value Ref Range Status   Adenovirus NOT DETECTED NOT DETECTED Final   Coronavirus 229E NOT DETECTED NOT DETECTED Final    Comment: (NOTE) The Coronavirus on the Respiratory Panel, DOES NOT test for the novel  Coronavirus (2019 nCoV)    Coronavirus HKU1 NOT DETECTED NOT DETECTED Final   Coronavirus NL63 NOT DETECTED NOT DETECTED Final   Coronavirus OC43 NOT DETECTED NOT DETECTED Final   Metapneumovirus NOT DETECTED NOT DETECTED Final   Rhinovirus / Enterovirus DETECTED (A) NOT DETECTED Final    Influenza A NOT DETECTED NOT DETECTED Final   Influenza B NOT DETECTED NOT DETECTED Final   Parainfluenza Virus 1 NOT DETECTED NOT DETECTED Final   Parainfluenza Virus 2 NOT DETECTED NOT DETECTED Final   Parainfluenza Virus 3 NOT DETECTED NOT DETECTED Final   Parainfluenza Virus 4 NOT DETECTED NOT DETECTED Final   Respiratory Syncytial Virus NOT DETECTED NOT DETECTED Final   Bordetella pertussis NOT DETECTED NOT DETECTED Final   Bordetella Parapertussis NOT DETECTED NOT DETECTED Final   Chlamydophila pneumoniae NOT DETECTED NOT DETECTED Final   Mycoplasma pneumoniae NOT DETECTED NOT DETECTED Final    Comment:  Performed at [facility], [address] [place], [place] [address]  Culture, blood (routine x 2)     Status: None (Preliminary result)   Collection Time: 11/08/23  5:00 PM   Specimen: BLOOD  Result Value Ref Range Status   Specimen Description BLOOD LEFT ANTECUBITAL  Final   Special Requests   Final    BOTTLES DRAWN AEROBIC ONLY Blood Culture results may not be optimal due to an inadequate volume of blood received in culture bottles   Culture  Setup Time   Final    GRAM POSITIVE COCCI AEROBIC BOTTLE ONLY CRITICAL VALUE NOTED.  VALUE IS CONSISTENT WITH PREVIOUSLY REPORTED AND CALLED VALUE. Performed at [facility], [address], [place], [place] [address]    Culture GRAM POSITIVE COCCI  Final   Report Status PENDING  Incomplete  MRSA Next Gen by PCR, Nasal     Status: None   Collection Time: 11/08/23  5:44 PM   Specimen: Nasal Mucosa; Nasal Swab  Result Value Ref Range Status   MRSA by PCR Next Gen NOT DETECTED NOT DETECTED Final    Comment: (NOTE) The GeneXpert MRSA Assay (FDA approved for NASAL specimens only), is one component of a comprehensive MRSA colonization surveillance program. It is not intended to diagnose MRSA infection nor to guide or monitor treatment for MRSA infections. Test performance is not FDA approved in patients less than 30  years old. Performed at [facility], [address], [place], [place] [address]    I have personally spent 30 minutes involved in face-to-face and non-face-to-face activities for this patient on the day of the visit. Professional time spent includes the following activities: Preparing to see the patient (review of tests), Obtaining and reviewing separately obtained history (admission/discharge record), performing a medically appropriate examination, ordering medications, communicating with other health care professionals, documenting clinical information in the EMR, communicating results and counseling patient and family regarding medication and plan of care, and care coordination.  Greg Tilak Oakley, NP [facility]  11/09/2023  12:44 PM

## 2023-11-09 NOTE — Evaluation (Signed)
 Physical Therapy Evaluation Patient Details Name: [name] MRN: [id number] DOB: 15-Nov-1940 Today's Date: 11/09/2023     Clinical Impression  Pt admitted with acute on chronic hypoxic respiratory failure, bacterial pneumonia, ILD/pulmonary fibrosis exacerbation, Rhinovirus, enterovirus. Previously independent and driving, uses a rollator for mobility at baseline, on 4-5 L supplemental O2. Daughter present during evaluation and actively participated. Pt required up to min assist with transfer and short distance gait, hand held support. Declines to use RW supplied in room but may be more open to rollator at next visit. SpO2 94% on 6L at rest, 93% on 5L, and 91% on 4L. Able to ambulate on 4L at 88%, and 6L at 92%. More fatigued and dyspneic than baseline. Family feels comfortable assisting pt and can provide temporary 24/7 assist - also spoke with Son earlier. Will continue to progress acutely. Anticipate HHPT follow-up needed after formal d/c from acute services. Pt currently with functional limitations due to the deficits listed below (see PT Problem List). Pt will benefit from acute skilled PT to increase their independence and safety with mobility to allow discharge.             11/09/23 1124  PT Visit Information  Last PT Received On 11/09/23  Assistance Needed +1  History of Present Illness 83 y.o. female presenting with worsening 6/4 with shortness of breath, seen by pulmonologist advised inpatient admission. Dx: Acute on chronic hypoxic respiratory failure Bacterial pneumonia ILD/ pulmonary fibrosis exacerbation Rhinovirus, enterovirus positive. PMH: hypertension, hyperlipidemia, GERD, chronic diastolic CHF, chronic respiratory failure with hypoxia, obesity, asthma, rheumatoid arthritis, pulmonary fibrosis, pulmonary hypertension, bronchiectasis.  Precautions  Precautions Fall  Recall of Precautions/Restrictions Impaired  Precaution/Restrictions Comments Monitor O2  Restrictions   Weight Bearing Restrictions Per Provider Order No  Home Living  Family/patient expects to be discharged to: Private residence  Living Arrangements Alone  Available Help at Discharge Family;Available 24 hours/day (son, daughter, grandchildren available to assist at home 24/7 for a while.)  Type of Home House  Home Access Stairs to enter  Entrance Stairs-Number of Steps 1 ((Threshold))  Entrance Stairs-Rails None  Home Layout One level  Bathroom Shower/Tub Walk-in shower ((Threshold to step over.))  Cytogeneticist (4 wheels);Transport chair;Grab bars - tub/shower  Prior Function  Prior Level of Function  Independent/Modified Independent;Driving;History of Falls (last six months)  Mobility Comments Rollator for mobility. Son reports 1 fall in the past 6 months.  ADLs Comments ind, driving  Pain Assessment  Pain Assessment No/denies pain  Cognition  Arousal Alert  Behavior During Therapy WFL for tasks assessed/performed  PT - Cognitive impairments Awareness;Attention;Problem solving;Safety/Judgement  PT - Cognition Comments Dtr reports not at baseline, seems to be a bit more "stubborn" than usual.  Following Commands  Following commands Intact  Cueing  Cueing Techniques Verbal cues;Gestural cues  Communication  Communication No apparent difficulties  Upper Extremity Assessment  Upper Extremity Assessment Defer to OT evaluation  Lower Extremity Assessment  Lower Extremity Assessment Generalized weakness  Bed Mobility  General bed mobility comments In recliner  Transfers  Overall transfer level Needs assistance  Equipment used Rolling walker (2 wheels);1 person hand held assist  Transfers Sit to/from Stand  Sit to Stand Min assist  General transfer comment Min assist for balance to rise with RW to steady upon standing. Declines RW use and sat back down. Second trial with hand held support.  Ambulation/Gait  Ambulation/Gait  assistance Min assist;Contact guard assist  Gait Distance (Feet) 22  Feet (6)  Assistive device 1 person hand held assist;Rolling walker (2 wheels)  Gait Pattern/deviations Step-through pattern;Decreased stride length;Shuffle;Antalgic;Drifts right/left  General Gait Details Initially took several steps forward and backwards but became distracted by RW and refused to use as she preferrs her rollator from home. Performed a second bout with hand held support, daughter actively participating. Slow gait, very guarded, min assist for support on 4L supplemental O2 88% SpO2, improved to 92% on 6L. Productive cough frequent. Educated on safety, awareness, and deficits noted. Fatigued easily. Dtr reports, pt moving a bit slower than usual and likes her rollator at home which may improve confidence.  Gait velocity decr  Gait velocity interpretation <1.31 ft/sec, indicative of household ambulator  Balance  Overall balance assessment Needs assistance  Sitting-balance support No upper extremity supported;Feet supported  Sitting balance-Leahy Scale Fair  Sitting balance - Comments sitting in chair  Standing balance support Single extremity supported;During functional activity  Standing balance-Leahy Scale Poor  General Comments  General comments (skin integrity, edema, etc.) At rest: SpO2 94% on 6L, 93% on 5L, 92% on 4L. HR 109 BP 129/67  Exercises  Exercises Other exercises (Provided digitally to DeAnn per request)  Other Exercises  Other Exercises Access Code: [email or web address]  URL: [email or web address]  Date: 11/09/2023  Prepared by: Jory Ng    Exercises  - Seated Long Arc Quad  - 2 x daily - 7 x weekly - 2 sets - 10 reps  - Supine Quadricep Sets  - 2 x daily - 7 x weekly - 2 sets - 10 reps  - Supine Gluteal Sets  - 2 x daily - 7 x weekly - 2 sets - 10 reps  - Supine Hip Abduction  - 2 x daily - 7 x weekly - 2 sets - 10 reps  - Seated March  - 2 x daily - 7 x weekly - 2 sets - 10 reps  - Sit to  Stand with Counter Support  - 2 x daily - 7 x weekly - 2 sets - 5 reps  - Supine Ankle Pumps  - 5 x daily - 7 x weekly - 1 sets - 20 reps  PT - End of Session  Equipment Utilized During Treatment Gait belt;Oxygen   Activity Tolerance Patient limited by fatigue  Patient left in chair;with call bell/phone within reach;with chair alarm set;with family/visitor present  Nurse Communication Mobility status  PT Assessment  PT Recommendation/Assessment Patient needs continued PT services  PT Visit Diagnosis Unsteadiness on feet (R26.81);Other abnormalities of gait and mobility (R26.89);Muscle weakness (generalized) (M62.81);History of falling (Z91.81);Difficulty in walking, not elsewhere classified (R26.2)  PT Problem List Decreased strength;Decreased activity tolerance;Decreased balance;Decreased mobility;Decreased cognition;Decreased knowledge of use of DME;Decreased safety awareness;Decreased knowledge of precautions;Cardiopulmonary status limiting activity  PT Plan  PT Frequency (ACUTE ONLY) Min 2X/week  PT Treatment/Interventions (ACUTE ONLY) DME instruction;Gait training;Stair training;Functional mobility training;Therapeutic activities;Balance training;Therapeutic exercise;Neuromuscular re-education;Patient/family education;Wheelchair mobility training;Cognitive remediation  AM-PAC PT "6 Clicks" Mobility Outcome Measure (Version 2)  Help needed turning from your back to your side while in a flat bed without using bedrails? 4  Help needed moving from lying on your back to sitting on the side of a flat bed without using bedrails? 3  Help needed moving to and from a bed to a chair (including a wheelchair)? 3  Help needed standing up from a chair using your arms (e.g., wheelchair or bedside chair)? 3  Help needed to walk in hospital room? 3  Help needed climbing 3-5 steps  with a railing?  3  6 Click Score 19  Consider Recommendation of Discharge To: Home with [facility]  Progressive Mobility  What is the  highest level of mobility based on the progressive mobility assessment? Level 5 (Walks with assist in room/hall) - Balance while stepping forward/back and can walk in room with assist - Complete  Activity Ambulated with assistance in hallway  PT Recommendation  Follow Up Recommendations Home health PT  Patient can return home with the following A little help with walking and/or transfers;A little help with bathing/dressing/bathroom;Assistance with cooking/housework;Direct supervision/assist for medications management;Direct supervision/assist for financial management;Assist for transportation;Help with stairs or ramp for entrance;Supervision due to cognitive status  Functional Status Assessment Patient has had a recent decline in their functional status and demonstrates the ability to make significant improvements in function in a reasonable and predictable amount of time.  PT equipment None recommended by PT  Individuals Consulted  Consulted and Agree with Results and Recommendations Patient;Family member/caregiver  Family Member Consulted Daughter and son  Acute Rehab PT Goals  Patient Stated Goal Go home  PT Goal Formulation With patient/family  Time For Goal Achievement 11/23/23  Potential to Achieve Goals Good  PT Time Calculation  PT Start Time (ACUTE ONLY) 1134  PT Stop Time (ACUTE ONLY) 1210  PT Time Calculation (min) (ACUTE ONLY) 36 min  PT General Charges  $$ ACUTE PT VISIT 1 Visit  PT Evaluation  $PT Eval Low Complexity 1 Low  PT Treatments  $Gait Training 8-22 mins             Jory Ng, PT, DPT [facility]  Rehabilitation Services Physical Therapist Office: [phone] Website: [email or web address]   Alinda Irani 11/09/2023, 3:34 PM

## 2023-11-09 NOTE — Evaluation (Signed)
 Occupational Therapy Evaluation Patient Details Name: [name] MRN: [id number] DOB: 1941-05-02 Today's Date: 11/09/2023   History of Present Illness   83 y.o. female presenting with worsening 6/4 with shortness of breath, seen by pulmonologist advised inpatient admission. Dx: Acute on chronic hypoxic respiratory failure Bacterial pneumonia ILD/ pulmonary fibrosis exacerbation Rhinovirus, enterovirus positive. PMH: hypertension, hyperlipidemia, GERD, chronic diastolic CHF, chronic respiratory failure with hypoxia, obesity, asthma, rheumatoid arthritis, pulmonary fibrosis, pulmonary hypertension, bronchiectasis.     Clinical Impressions Pt admitted for above, PTA pt reports being independent with ADLs and using Rollator for mobility, lives alone but currently has family available to assist for a while. During today's session pt more lethargic and drowsy than her PT session earlier, demonstrated some mild cognitive deficits that will be further assessed next session suspect medications have caused increased drowsiness. Pt noted to be unsteady and needing min A for dynamic balance in standing, currently completing ADLs with Min A to setup A. OT to follow-up with pt tomorrow to reassess her when more alert and determine post acute needs going forward.      If plan is discharge home, recommend the following:   Assistance with cooking/housework;A little help with bathing/dressing/bathroom;Help with stairs or ramp for entrance;Assist for transportation     Functional Status Assessment   Patient has had a recent decline in their functional status and demonstrates the ability to make significant improvements in function in a reasonable and predictable amount of time.     Equipment Recommendations   Other (comment) (To be further assessed)     Recommendations for Other Services         Precautions/Restrictions   Precautions Precautions: Fall Recall of  Precautions/Restrictions: Impaired Precaution/Restrictions Comments: Monitor O2 Restrictions Weight Bearing Restrictions Per Provider Order: No     Mobility Bed Mobility Overal bed mobility: Needs Assistance Bed Mobility: Sit to Supine       Sit to supine: Contact guard assist   General bed mobility comments: In recliner on arrival    Transfers Overall transfer level: Needs assistance Equipment used: Rollator (4 wheels) Transfers: Sit to/from Stand Sit to Stand: Contact guard assist           General transfer comment: cues needed for Rollator brakes. pt needing assist to control descent onto bed.      Balance Overall balance assessment: Needs assistance Sitting-balance support: No upper extremity supported, Feet supported Sitting balance-Leahy Scale: Fair Sitting balance - Comments: sitting in chair   Standing balance support: During functional activity, Bilateral upper extremity supported Standing balance-Leahy Scale: Poor Standing balance comment: UE support for balance.                           ADL either performed or assessed with clinical judgement   ADL Overall ADL's : Needs assistance/impaired Eating/Feeding: Independent;Sitting   Grooming: Sitting;Set up   Upper Body Bathing: Sitting;Set up   Lower Body Bathing: Sitting/lateral leans;Minimal assistance   Upper Body Dressing : Sitting;Set up   Lower Body Dressing: Minimal assistance;Sit to/from stand Lower Body Dressing Details (indicate cue type and reason): min A to steady balance, able to doff/don socks with setup while sitting in recliner. Toilet Transfer: Minimal assistance;Rolling walker (2 wheels);Stand-pivot Statistician Details (indicate cue type and reason): sim transfer back to bed; min A to steady balance. Toileting- Clothing Manipulation and Hygiene: Sit to/from stand;Minimal assistance       Functional mobility during ADLs: Minimal assistance;Rolling walker (2  wheels) General ADL Comments: Reinforced use of pursed lip breathing.     Vision         Perception         Praxis         Pertinent Vitals/Pain Pain Assessment Pain Assessment: No/denies pain     Extremity/Trunk Assessment Upper Extremity Assessment Upper Extremity Assessment: Generalized weakness   Lower Extremity Assessment Lower Extremity Assessment: Generalized weakness       Communication Communication Communication: No apparent difficulties   Cognition Arousal: Lethargic Behavior During Therapy: WFL for tasks assessed/performed Cognition: No family/caregiver present to determine baseline             OT - Cognition Comments: Pt seemingly with altered cog, demonstrating slower processing. She stated "Can i get these shoes off" while she had on socks she did not have on shoes. Pt very lethargic, ended up going to sleep right after being returned to supine in bed.                 Following commands: Impaired Following commands impaired: Follows one step commands with increased time     Cueing  General Comments   Cueing Techniques: Verbal cues;Gestural cues  Sp02 with poor pleth reading 85% during transfer but quickly returned to 91% on 5L pleth remained poor. Notified RN   Exercises     Shoulder Instructions      Home Living Family/patient expects to be discharged to:: Private residence Living Arrangements: Alone Available Help at Discharge: Family;Available 24 hours/day (son, daughter, grandchildren available to assist at home 24/7 for a while.) Type of Home: House Home Access: Stairs to enter [employer] of Steps: 1 ((Threshold)) Entrance Stairs-Rails: None Home Layout: One level     Bathroom Shower/Tub: Walk-in shower ((Threshold to step over.))   Bathroom Toilet: Handicapped height     Home Equipment: Rollator (4 wheels);Transport chair;Grab bars - tub/shower          Prior Functioning/Environment Prior Level of  Function : Independent/Modified Independent;Driving;History of Falls (last six months)             Mobility Comments: Rollator for mobility. Son reports 1 fall in the past 6 months. ADLs Comments: ind, driving    OT Problem List: Cardiopulmonary status limiting activity;Impaired balance (sitting and/or standing);Decreased strength   OT Treatment/Interventions: Self-care/ADL training;Patient/family education;Therapeutic exercise;Balance training;Therapeutic activities;Energy conservation;DME and/or AE instruction      OT Goals(Current goals can be found in the care plan section)   Acute Rehab OT Goals Patient Stated Goal: none stated OT Goal Formulation: Patient unable to participate in goal setting Time For Goal Achievement: 11/23/23 Potential to Achieve Goals: Good ADL Goals Pt Will Perform Grooming: (P) with supervision;standing Pt Will Perform Lower Body Dressing: (P) with supervision;sit to/from stand Pt Will Transfer to Toilet: (P) with supervision;ambulating Pt Will Perform Toileting - Clothing Manipulation and hygiene: (P) with supervision;sit to/from stand Pt Will Perform Tub/Shower Transfer: (P) Shower transfer;ambulating Additional ADL Goal #1: (P) Pt will demonstrate independent use of energy conservation strategies prn.   OT Frequency:  Min 2X/week    Co-evaluation              AM-PAC OT "6 Clicks" Daily Activity     Outcome Measure Help from another person eating meals?: None Help from another person taking care of personal grooming?: A Little Help from another person toileting, which includes using toliet, bedpan, or urinal?: A Little Help from another person bathing (including washing, rinsing, drying)?: A  Little Help from another person to put on and taking off regular upper body clothing?: A Little Help from another person to put on and taking off regular lower body clothing?: A Little 6 Click Score: 19   End of Session Equipment Utilized During  Treatment: Gait belt;Rollator (4 wheels) Nurse Communication: Mobility status  Activity Tolerance: Patient limited by lethargy;Patient limited by fatigue Patient left: in bed;with call bell/phone within reach;with bed alarm set  OT Visit Diagnosis: Unsteadiness on feet (R26.81);Other abnormalities of gait and mobility (R26.89);Muscle weakness (generalized) (M62.81);Other (comment) (SOB)                Time: 7829-5621 OT Time Calculation (min): 10 min Charges:  OT General Charges $OT Visit: 1 Visit OT Evaluation $OT Eval Low Complexity: 1 Low  11/09/2023  AB, OTR/L  Acute Rehabilitation Services  Office: [phone]   Jorene New 11/09/2023, 5:56 PM

## 2023-11-09 NOTE — Care Management Obs Status (Signed)
 MEDICARE OBSERVATION STATUS NOTIFICATION   Patient Details  Name: EMILIE CARP MRN: [id number] Date of Birth: 19-Apr-1941   Medicare Observation Status Notification Given:  Yes  Moon/Obs leter was explained but patient can not sign copy given to the family member  Wynonia Hedges 11/09/2023, 10:14 AM

## 2023-11-10 DIAGNOSIS — Z515 Encounter for palliative care: Secondary | ICD-10-CM | POA: Diagnosis not present

## 2023-11-10 DIAGNOSIS — I4891 Unspecified atrial fibrillation: Secondary | ICD-10-CM

## 2023-11-10 DIAGNOSIS — B955 Unspecified streptococcus as the cause of diseases classified elsewhere: Secondary | ICD-10-CM

## 2023-11-10 DIAGNOSIS — J471 Bronchiectasis with (acute) exacerbation: Secondary | ICD-10-CM | POA: Diagnosis not present

## 2023-11-10 DIAGNOSIS — Z7189 Other specified counseling: Secondary | ICD-10-CM | POA: Diagnosis not present

## 2023-11-10 DIAGNOSIS — J9611 Chronic respiratory failure with hypoxia: Secondary | ICD-10-CM | POA: Diagnosis not present

## 2023-11-10 DIAGNOSIS — J9621 Acute and chronic respiratory failure with hypoxia: Secondary | ICD-10-CM | POA: Diagnosis not present

## 2023-11-10 DIAGNOSIS — R7881 Bacteremia: Secondary | ICD-10-CM | POA: Diagnosis not present

## 2023-11-10 DIAGNOSIS — B953 Streptococcus pneumoniae as the cause of diseases classified elsewhere: Secondary | ICD-10-CM

## 2023-11-10 DIAGNOSIS — J849 Interstitial pulmonary disease, unspecified: Secondary | ICD-10-CM | POA: Diagnosis not present

## 2023-11-10 DIAGNOSIS — J8410 Pulmonary fibrosis, unspecified: Secondary | ICD-10-CM | POA: Diagnosis not present

## 2023-11-10 MED ORDER — METOPROLOL TARTRATE 5 MG/5ML IV SOLN
5.0000 mg | Freq: Four times a day (QID) | INTRAVENOUS | Status: DC | PRN
  Administered 2023-11-10 – 2023-11-11 (×3): 5 mg via INTRAVENOUS
  Filled 2023-11-10 (×3): qty 5

## 2023-11-10 NOTE — Progress Notes (Signed)
 Occupational Therapy Treatment Patient Details Name: [name] MRN: [id number] DOB: Jan 25, 1941 Today's Date: 11/10/2023   History of present illness 83 y.o. female presenting with worsening 6/4 with shortness of breath, seen by pulmonologist advised inpatient admission. Dx: Acute on chronic hypoxic respiratory failure Bacterial pneumonia ILD/ pulmonary fibrosis exacerbation Rhinovirus, enterovirus positive. PMH: hypertension, hyperlipidemia, GERD, chronic diastolic CHF, chronic respiratory failure with hypoxia, obesity, asthma, rheumatoid arthritis, pulmonary fibrosis, pulmonary hypertension, bronchiectasis.   OT comments  Pt more alert this visit, interactive. Declined ADL training, but agreeable to sitting EOB, standing and side stepping toward HOB. All mobility requires min assist. Pt has all necessary DME in the home. Pt appears visibly fatigued. Maintaining SpO2> 94% on 5L, HR 130s. Recommending HHOT, daughter at bedside and in agreement.       If plan is discharge home, recommend the following:  Assistance with cooking/housework;A little help with bathing/dressing/bathroom;Help with stairs or ramp for entrance;Assist for transportation   Equipment Recommendations  None recommended by OT    Recommendations for Other Services      Precautions / Restrictions Precautions Precautions: Fall Recall of Precautions/Restrictions: Impaired Precaution/Restrictions Comments: Monitor O2 and HR Restrictions Weight Bearing Restrictions Per Provider Order: No       Mobility Bed Mobility Overal bed mobility: Needs Assistance Bed Mobility: Supine to Sit, Sit to Supine   Sidelying to sit: Min assist Supine to sit: Min assist     General bed mobility comments: light min assist to raise trunk and for LEs back into bed    Transfers Overall transfer level: Needs assistance Equipment used: 1 person hand held assist, 2 person hand held assist Transfers: Sit to/from Stand Sit to  Stand: Min assist           General transfer comment: stood and took side steps to [facility]     Balance Overall balance assessment: Needs assistance   Sitting balance-Leahy Scale: Fair     Standing balance support: Single extremity supported Standing balance-Leahy Scale: Poor                             ADL either performed or assessed with clinical judgement   ADL   Eating/Feeding: Independent;Sitting;Bed level                                     General ADL Comments: declined ADL training    Extremity/Trunk Assessment              Vision       Perception     Praxis     Communication Communication Communication: No apparent difficulties   Cognition Arousal: Alert Behavior During Therapy: WFL for tasks assessed/performed Cognition: Cognition impaired       Memory impairment (select all impairments): Short-term memory   Executive functioning impairment (select all impairments): Initiation OT - Cognition Comments: deferring some questions to her daughter, increased time to follow simple commands                 Following commands: Impaired Following commands impaired: Follows one step commands with increased time      Cueing   Cueing Techniques: Verbal cues  Exercises      Shoulder Instructions       General Comments      Pertinent Vitals/ Pain       Pain Assessment Pain Assessment: No/denies  pain  Home Living                                          Prior Functioning/Environment              Frequency  Min 2X/week        Progress Toward Goals  OT Goals(current goals can now be found in the care plan section)  Progress towards OT goals: Progressing toward goals  Acute Rehab OT Goals OT Goal Formulation: With patient Time For Goal Achievement: 11/23/23 Potential to Achieve Goals: Good ADL Goals Pt Will Perform Grooming: with supervision;standing Pt Will Perform Lower Body  Dressing: with supervision;sit to/from stand Pt Will Transfer to Toilet: with supervision;ambulating Pt Will Perform Toileting - Clothing Manipulation and hygiene: with supervision;sit to/from stand Pt Will Perform Tub/Shower Transfer: Shower transfer;ambulating Additional ADL Goal #1: Pt will demonstrate independent use of energy conservation strategies prn.  Plan      Co-evaluation                 AM-PAC OT "6 Clicks" Daily Activity     Outcome Measure   Help from another person eating meals?: None Help from another person taking care of personal grooming?: A Little Help from another person toileting, which includes using toliet, bedpan, or urinal?: A Little Help from another person bathing (including washing, rinsing, drying)?: A Little Help from another person to put on and taking off regular upper body clothing?: A Little Help from another person to put on and taking off regular lower body clothing?: A Little 6 Click Score: 19    End of Session Equipment Utilized During Treatment: Oxygen   OT Visit Diagnosis: Unsteadiness on feet (R26.81);Other abnormalities of gait and mobility (R26.89);Muscle weakness (generalized) (M62.81);Other (comment);Other symptoms and signs involving cognitive function (decreased activity tolerance)   Activity Tolerance Patient limited by fatigue   Patient Left in bed;with call bell/phone within reach;with family/visitor present;with nursing/sitter in room;with bed alarm set   Nurse Communication          Time: 1455-1511 OT Time Calculation (min): 16 min  Charges: OT General Charges $OT Visit: 1 Visit OT Treatments $Therapeutic Activity: 8-22 mins  Avanell Leigh, OTR/L Acute Rehabilitation Services Office: [phone]   Jonette Nestle 11/10/2023, 3:18 PM

## 2023-11-10 NOTE — Consult Note (Signed)
 Palliative Care Consult Note                                  Date: 11/10/2023   Patient Name: [name]  DOB: Mar 04, 1941  MRN: [id number]  Age / Sex: 83 y.o., female  PCP: Jimmey Mould, MD Referring Physician: Aisha Hove, MD  Reason for Consultation: Establishing goals of care  HPI/Patient Profile: 83 y.o. female  with past medical history of ILD/pulmonary fibrosis, chronic respiratory failure on home O2, chronic HFpEF, rheumatoid arthritis, pulmonary hypertension, bronchiectasis, hypertension, hyperlipidemia, and GERD who presented to the ED on 11/08/2023 with worsening shortness of breath.  She was sent from pulmonary office given findings on CT concerning for pneumonia.  She is admitted with viral pneumonia and secondary bacterial strep pneumoniae pneumonia and bacteremia.  Palliative Medicine has been consulted for goals of care discussions. Patient and family are faced with anticipatory care needs and complex medical decision making.   Past Medical History:  Diagnosis Date   Acute on chronic respiratory failure with hypoxia (HCC) 06/13/2016   Allergic rhinitis    takes Claritin  nightly   Anxiety    but not on any meds   Arthritis    Asthma    Carpal tunnel syndrome    CHF (congestive heart failure) (HCC)    patient denies-in office note 05/2015 by Malen Scudder   GERD (gastroesophageal reflux disease)    History of bronchitis 3-45yrs ago   History of colon polyps    benign   Joint pain    Peripheral edema    takes Lasix  daily   Pneumonitis, hypersensitivity (HCC) 05/2016   chronic hypersensitivity pneumonitis- Dr. Michela Aguas   Pulmonary hypertension (HCC) 06/30/2017   patient denies-in office note from Dr. Michela Aguas-   Shortness of breath dyspnea    with exertion;Albuterol  inhaler as needed   Thyroid  nodule       Clinical Assessment and Goals of Care:   Extensive chart review has been completed  including labs, vital signs, imaging, progress/consult notes, orders, medications and available advance directive documents.    I met with patient and her daughter/[name] to discuss diagnosis, prognosis,and GOC. Patient is alert and mostly oriented, but seems to be having some intermittent confusion/delirium.   I introduced Palliative Medicine as specialized medical care for people living with serious illness. It focuses on providing relief from the symptoms and stress of a serious illness.   Created space and opportunity for patient and family to express thoughts and feelings regarding current medical situation. Values and goals of care were attempted to be elicited.  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 She has 1 daughter and 1 son, as well as 8 grandchildren  Functional Status: Prior to admission, patient lived alone and was independent. She continued to drive.   Discussion: We discussed patient's current illness and what it means in the larger context of her ongoing co-morbidities. Current clinical status was reviewed. Discussed the natural trajectory of ILD, as progressive and irreversible.  Patient is anxious to go home. Her main goal of care is to  go home as soon as possible. We reviewed that she has secondary bacterial pneumonia and bacteremia, and that ID is consulted to optimize antibiotic therapy.   A discussion was had today regarding advanced directives. We discussed code status and scopes of care. We discussed the difference between full scope versus limited interventions versus comfort care. The MOST form was introduced and discussed.  Patient agrees to DNR in the event of cardiac arrest, but otherwise would want full scope interventions. She would be ok with intubation for a time trial, but would not want to be prolonged on a ventilator if her condition was not improving.   Discussed the importance  of continued conversation with the medical team regarding overall plan of care and treatment options, ensuring decisions are within the context of the patients values and GOCs.  Questions and concerns addressed. Patient/family encouraged to call with questions or concerns.   Review of Systems  All other systems reviewed and are negative.   Objective:   Primary Diagnoses: Present on Admission:  Severe persistent asthma, uncomplicated  Rheumatoid arthritis (HCC)  Pulmonary hypertension (HCC)  Pulmonary fibrosis (HCC)  Obesity  Interstitial lung disease (HCC)  HTN (hypertension)  HLD (hyperlipidemia)  GERD  Diastolic CHF (HCC)  Chronic respiratory failure with hypoxia (HCC)  Bronchiectasis with (acute) exacerbation (HCC)  Acute on chronic respiratory failure with hypoxia [facility])   Physical Exam Vitals reviewed.  Constitutional:      General: She is not in acute distress.    Appearance: She is ill-appearing.  Cardiovascular:     Rate and Rhythm: Regular rhythm. Tachycardia present.  Pulmonary:     Effort: Pulmonary effort is normal. Tachypnea present.  Neurological:     Mental Status: She is alert and oriented to person, place, and time.  Psychiatric:        Speech: Speech is tangential.    Palliative Assessment/Data: PPS 50%     Assessment & Plan:   SUMMARY OF RECOMMENDATIONS   Code status changed to DNR with pre-arrest interventions Continue current supportive interventions Goal of care is to return home as soon as medically stable/optimized PMT will continue to follow  Primary Decision Maker: PATIENT  Existing Vynca/ACP Documentation: None Daughter reports patient has HCPOA and living will documents.  I requested they bring copies to the hospital  Symptom Management:  Per attending  Prognosis:  Unable to determine  Discharge Planning:  To Be Determined    Thank you for allowing us  to participate in the care of [name]   Time Total:  76 minutes  Detailed review of medical records (labs, imaging, vital signs), medically appropriate exam, discussed with treatment team, counseling and education to patient, family, & staff, documenting clinical information, coordination of care.  Signed by: Maisie Scotland, NP Palliative Medicine Team  Team Phone # [phone]  For individual providers, please see AMION

## 2023-11-10 NOTE — Progress Notes (Signed)
 PT Cancellation Note  Patient Details Name: [name] MRN: [id number] DOB: [birth year]   Cancelled Treatment:    Reason Eval/Treat Not Completed: Other (comment) (Provider in room, acute PT to re-attempt as schedule allows.)  Izaan Kingbird W, PT, DPT Secure Chat Preferred  Rehab Office [phone]  Alissa April Adela Ades 11/10/2023, 10:58 AM

## 2023-11-10 NOTE — Progress Notes (Addendum)
    [facility]    Date of Admission:  11/08/2023   Total days of antibiotics   ID: [name] is a 83 y.o. female with  viral pneumonia now with strep pneumonaie pna nad secondary bacteremia c/b atrial fib/a flutter Principal Problem:   Acute on chronic respiratory failure with hypoxia (HCC) Active Problems:   HLD (hyperlipidemia)   GERD   Obesity   HTN (hypertension)   Interstitial lung disease (HCC)   Bronchiectasis with (acute) exacerbation (HCC)   Rheumatoid arthritis (HCC)   Pulmonary fibrosis (HCC)   Pulmonary hypertension (HCC)   Diastolic CHF (HCC)   Chronic respiratory failure with hypoxia (HCC)   Severe persistent asthma, uncomplicated   Bacteremia due to Streptococcus pneumoniae   Rhinovirus    Subjective: Afebrile. Remains in afib in 130-140s after ambulating to bedside commode. Still has productive cough  Medications:   enoxaparin  (LOVENOX ) injection  40 mg Subcutaneous Q24H   Nintedanib   1 capsule Oral BID   sodium chloride  flush  3 mL Intravenous Q12H   sodium chloride  HYPERTONIC  4 mL Nebulization BID    Objective: Vital signs in last 24 hours: Temp:  [97.6 F (36.4 C)-98.5 F (36.9 C)] 98 F (36.7 C) (06/06 1511) Pulse Rate:  [95-116] 116 (06/06 1511) Resp:  [14-22] 20 (06/06 1511) BP: (118-139)/(60-77) 139/74 (06/06 1511) SpO2:  [93 %-99 %] 97 % (06/06 1511)  Physical Exam  Constitutional:  oriented to person, place, and time. appears well-developed and well-nourished. No distress.  HENT: [place]/AT, PERRLA, no scleral icterus Mouth/Throat: Oropharynx is clear and moist. No oropharyngeal exudate.  Cardiovascular: Normal rate, regular rhythm and normal heart sounds. Exam reveals no gallop and no friction rub.  No murmur heard.  Pulmonary/Chest: Effort normal and rhonchi Neck = supple, no nuchal rigidity Abdominal: Soft. Bowel sounds are normal.  exhibits no distension. There is no tenderness.  Lymphadenopathy: no  cervical adenopathy. No axillary adenopathy Neurological: alert and oriented to person, place, and time.  Skin: Skin is warm and dry. No rash noted. No erythema.  Psychiatric: a normal mood and affect.  behavior is normal.    Lab Results Recent Labs    11/08/23 1155 11/09/23 0227  WBC 11.0* 13.0*  HGB 13.8 13.1  HCT 42.9 40.8  NA 132* 135  K 3.3* 3.7  CL 93* 99  CO2 25 23  BUN 15 14  CREATININE 0.68 0.79   Liver Panel Recent Labs    11/09/23 0227  PROT 6.0*  ALBUMIN 2.0*  AST 25  ALT 12  ALKPHOS 67  BILITOT 1.9*   Sedimentation Rate No results for input(s): "ESRSEDRATE" in the last 72 hours. C-Reactive Protein No results for input(s): "CRP" in the last 72 hours.  Microbiology: Blood cx 6/4: strep pneumonaie (sensitivities pending) Studies/Results: No results found.   Assessment/Plan: Strep pneumonia and bacteremia = continue with ceftriaxone  2gm iv daily. Would like wait til tomorrow to see how she improves and have sensitivities to see if can transition to orals. Recommend repeat blood cx tomorrow  ILD= continue with bronchodialtaers  Atrial fib= defer to primary team for further management if persists beyond tomorrow.suspect it is triggered by infection  Continue on droplet isolation for rhinovirus  evaluation of this patient requires complex antimicrobial therapy evaluation and counseling and isolation needs for disease transmission risk assessment and mitigation.    [facility] Pager: [phone]  11/10/2023, 4:51 PM

## 2023-11-10 NOTE — Progress Notes (Addendum)
 Progress Note   Patient: [name]:[id number] DOB: Nov 13, 1940 DOA: 11/08/2023     1 DOS: the patient was seen and examined on 11/10/2023   Brief hospital course: [name] is a 83 y.o. female with medical history significant of hypertension, hyperlipidemia, GERD, chronic diastolic CHF, chronic respiratory failure with hypoxia, obesity, asthma, rheumatoid arthritis, pulmonary fibrosis, pulmonary hypertension, bronchiectasis presenting with worsening shortness of breath seen by pulmonologist advised inpatient admission for pneumonia, ILD flare up management.  Assessment and Plan: Acute on chronic hypoxic respiratory failure Bacterial pneumonia ILD/ pulmonary fibrosis exacerbation Rhinovirus, enterovirus positive- Patient is presentation with worsening shortness of breath, productive cough, elevated white count, Pro-Cal. Respiratory viral panel positive for Rhino/ enterovirus. Blood cultures positive for strep pneumonia. Continue to monitor patient in progressive care unit. Continue IV Rocephin , azithromycin . Repeat blood cultures for tomorrow ordered. Continue supplemental oxygen , she wears 4L at home. Continue bronchodilator therapy. PT/ OT follow up.  Hypokalemia Replaced. Electrolytes for AM ordered.  Chronic respiratory failure on oxygen : Continue home oxygen  and adjust accordingly.   Chronic diastolic CHF Last echo was in January 2024 with EF 55-60%, G1 DD, normal RV function, evidence of pulmonary hypertension. She takes Lasix  20mg  as needed at home. Hold diuretic for now as she is eating poor.      Out of bed to chair. Incentive spirometry. Nursing supportive care. Fall, aspiration precautions. Diet:  Diet Orders (From admission, onward)     Start     Ordered   11/08/23 1416  Diet Heart Room service appropriate? Yes; Fluid consistency: Thin; Fluid restriction: 2000 mL Fluid  Diet effective now       Question Answer Comment  Room service  appropriate? Yes   Fluid consistency: Thin   Fluid restriction: 2000 mL Fluid      11/08/23 1427           DVT prophylaxis: enoxaparin  (LOVENOX ) injection 40 mg Start: 11/08/23 2200  Level of care: Progressive   Code Status: Do not attempt resuscitation (DNR) PRE-ARREST INTERVENTIONS DESIRED  Subjective: Patient is seen and examined today morning. She feels better than yesterday. Cough better. Still weak, eating poor. Wishes to go home. Did talk about low staffing for hospital at home during weekend, advised to stay until she clears infection.  Physical Exam: Vitals:   11/09/23 2005 11/10/23 0405 11/10/23 0726 11/10/23 1112  BP:  118/60 135/77 139/63  Pulse:  95  98  Resp:  (!) 22 19 14   Temp:  97.6 F (36.4 C) 97.8 F (36.6 C) 98.5 F (36.9 C)  TempSrc:  Axillary Oral Oral  SpO2: 98% 93% 96% 99%  Weight:      Height:        General - Elderly ill Caucasian female, mild respiratory distress with exertion. HEENT - PERRLA, EOMI, atraumatic head, non tender sinuses. Lung - Clear, diffuse rales, rhonchi, wheezes, using accessory muscles. Heart - S1, S2 heard, no murmurs, rubs, trace pedal edema. Abdomen - Soft, non tender, bowel sounds good Neuro - Alert, awake and oriented x 3, non focal exam. Skin - Warm and dry.  Data Reviewed:      Latest Ref Rng & Units 11/09/2023    2:27 AM 11/08/2023   11:55 AM 09/26/2022    4:49 PM  CBC  WBC 4.0 - 10.5 K/uL 13.0  11.0  10.9   Hemoglobin 12.0 - 15.0 g/dL 81.1  91.4  78.2   Hematocrit 36.0 - 46.0 % 40.8  42.9  42.9  Platelets 150 - 400 K/uL 297  284  334.0       Latest Ref Rng & Units 11/09/2023    2:27 AM 11/08/2023   11:55 AM 05/17/2022   12:02 PM  BMP  Glucose 70 - 99 mg/dL 83  578  78   BUN 8 - 23 mg/dL 14  15  9    Creatinine 0.44 - 1.00 mg/dL 4.69  6.29  5.28   Sodium 135 - 145 mmol/L 135  132  140   Potassium 3.5 - 5.1 mmol/L 3.7  3.3  4.0   Chloride 98 - 111 mmol/L 99  93  100   CO2 22 - 32 mmol/L 23  25  35    Calcium 8.9 - 10.3 mg/dL 8.5  8.6  9.3    DG Chest 2 View Result Date: 11/08/2023 CLINICAL DATA:  Shortness of breath, cough, pneumonia EXAM: CHEST - 2 VIEW COMPARISON:  05/26/2021 FINDINGS: Severe chronic opacities throughout the lungs bilaterally, most pronounced diffusely throughout the left lung and in the right upper lobe. These findings are stable since prior imaging and most compatible with severe chronic lung disease/fibrosis. No definite acute process. Heart and mediastinal contours within normal limits. No visible effusions. IMPRESSION: Stable severe chronic interstitial lung disease/fibrosis. No definite acute process. Electronically Signed   By: Janeece Mechanic M.D.   On: 11/08/2023 13:31    Family Communication: Discussed with patient, she understand and agree. All questions answered.   Disposition: Status is: Inpatient Remains inpatient appropriate because: bacteremia, pneumonia treatment, IV antibiotics, nebs, supplemental O2  Planned Discharge Destination: Home with Home Health     Time spent 40 min  Author: Aisha Hove, MD 11/10/2023 12:40 PM Secure chat 7am to 7pm For on call review [email or web address].

## 2023-11-10 NOTE — Progress Notes (Signed)
 Physical Therapy Treatment Patient Details Name: [name] MRN: [id number] DOB: [birth year] Today's Date: 11/10/2023   History of Present Illness 83 y.o. female presenting with worsening 6/4 with shortness of breath, seen by pulmonologist advised inpatient admission. Dx: Acute on chronic hypoxic respiratory failure Bacterial pneumonia ILD/ pulmonary fibrosis exacerbation Rhinovirus, enterovirus positive. PMH: hypertension, hyperlipidemia, GERD, chronic diastolic CHF, chronic respiratory failure with hypoxia, obesity, asthma, rheumatoid arthritis, pulmonary fibrosis, pulmonary hypertension, bronchiectasis.   PT Comments  Pt in bed upon arrival with daughter present and agreeable to PT session. Limited session as pt was feeling SOB with increased RR and frequent coughing. Pt continues to need MinA/CGA for bed mobility with MinA to stand. Declined use of rollator with pt preferring [facility] with daughter assisting. Able to perform step-pivot transfer to/from [facility] with slight assist via [facility]. No assist needed for pericare. Anticipate pt will progress well with continued mobility and symptom management. Continue to recommend HHPT. Acute PT to follow.  SpO2 >96% on 5L    If plan is discharge home, recommend the following: A little help with walking and/or transfers;A little help with bathing/dressing/bathroom;Assistance with cooking/housework;Direct supervision/assist for medications management;Direct supervision/assist for financial management;Assist for transportation;Help with stairs or ramp for entrance;Supervision due to cognitive status   Can travel by private vehicle      Yes  Equipment Recommendations  None recommended by PT       Precautions / Restrictions Precautions Precautions: Fall Recall of Precautions/Restrictions: Impaired Precaution/Restrictions Comments: Monitor O2 Restrictions Weight Bearing Restrictions Per Provider Order: No     Mobility  Bed Mobility Overal bed  mobility: Needs Assistance Bed Mobility: Rolling, Sidelying to Sit, Sit to Supine Rolling: Contact guard assist Sidelying to sit: Min assist, HOB elevated   Sit to supine: Contact guard assist, HOB elevated   General bed mobility comments: MinA for slight trunk raise, able to return to supine with CGA for safety    Transfers Overall transfer level: Needs assistance Equipment used: 1 person hand held assist, 2 person hand held assist Transfers: Sit to/from Stand, Bed to chair/wheelchair/BSC Sit to Stand: Min assist   Step pivot transfers: Min assist     General transfer comment: declined using rollator, able to stand with [facility] and [facility] with MinA for support. Able to perform step-pivot to/from [facility]    Ambulation/Gait    General Gait Details: Deferred due to pt feeling SOB with increased RR consistent coughing. Able to side-step at EOB     Balance Overall balance assessment: Needs assistance Sitting-balance support: No upper extremity supported, Feet supported Sitting balance-Leahy Scale: Fair     Standing balance support: Bilateral upper extremity supported, Single extremity supported, During functional activity Standing balance-Leahy Scale: Poor Standing balance comment: reliant on UE and external support        Communication Communication Communication: No apparent difficulties  Cognition Arousal: Alert Behavior During Therapy: WFL for tasks assessed/performed   PT - Cognitive impairments: Awareness, Attention, Problem solving, Safety/Judgement     PT - Cognition Comments: externally distracted by lines, cues for sequencing with increased difficulty problem solving Following commands: Impaired Following commands impaired: Follows one step commands with increased time    Cueing Cueing Techniques: Verbal cues, Gestural cues         Pertinent Vitals/Pain Pain Assessment Pain Assessment: No/denies pain     PT Goals (current goals can now be found in the care plan  section) Acute Rehab PT Goals Patient Stated Goal: Go home PT Goal Formulation: With patient/family Time  For Goal Achievement: 11/23/23 Potential to Achieve Goals: Good Progress towards PT goals: Progressing toward goals    Frequency    Min 2X/week       AM-PAC PT "6 Clicks" Mobility   Outcome Measure  Help needed turning from your back to your side while in a flat bed without using bedrails?: None Help needed moving from lying on your back to sitting on the side of a flat bed without using bedrails?: A Little Help needed moving to and from a bed to a chair (including a wheelchair)?: A Little Help needed standing up from a chair using your arms (e.g., wheelchair or bedside chair)?: A Little Help needed to walk in hospital room?: A Little Help needed climbing 3-5 steps with a railing? : A Lot 6 Click Score: 18    End of Session Equipment Utilized During Treatment: Oxygen  Activity Tolerance: Patient limited by fatigue;Other (comment) (feeling SOB with increased coughing) Patient left: in bed;with call bell/phone within reach;with family/visitor present Nurse Communication: Mobility status (symptoms) PT Visit Diagnosis: Unsteadiness on feet (R26.81);Other abnormalities of gait and mobility (R26.89);Muscle weakness (generalized) (M62.81);History of falling (Z91.81);Difficulty in walking, not elsewhere classified (R26.2)     Time: 4098-1191 PT Time Calculation (min) (ACUTE ONLY): 17 min  Charges:    $Therapeutic Activity: 8-22 mins PT General Charges $$ ACUTE PT VISIT: 1 Visit                     Orysia Blas, PT, DPT Secure Chat Preferred  Rehab Office [phone]   Alissa April Adela Ades 11/10/2023, 2:25 PM

## 2023-11-10 NOTE — TOC Initial Note (Signed)
 Transition of Care [facility]) - Initial/Assessment Note    Patient Details  Name: [name] MRN: [id number] Date of Birth: 01-24-1941  [facility] CM/SW Contact:    Jeani Mill, RN Phone Number: 11/10/2023, 4:13 PM  Clinical Narrative:    Eldora Greet to patient regarding PT, OT recommendations.  Patient is agreeable to home health.  This RNCM offered choice for Home Health, Lenita G Dech states she has no preference, RNCM made referral to Amy with [facility], She is able to take referral. Patient has all needed DME.  Family available for transportation.  Address, Phone number and PCP verified.                Expected Discharge Plan: Home w Home Health Services Barriers to Discharge: Continued Medical Work up   Patient Goals and CMS Choice Patient states their goals for this hospitalization and ongoing recovery are:: return home CMS Medicare.gov Compare Post Acute Care list provided to:: Patient Choice offered to / list presented to : Patient      Expected Discharge Plan and Services   Discharge Planning Services: CM Consult   Living arrangements for the past 2 months: Single Family Home                           HH Arranged: PT, OT [facility] Agency: [facility] Date [facility] Agency Contacted: 11/10/23 Time HH Agency Contacted: 1610 Representative spoke with at [facility] Agency: Amy  Prior Living Arrangements/Services Living arrangements for the past 2 months: Single Family Home Lives with:: Self Patient language and need for interpreter reviewed:: Yes Do you feel safe going back to the place where you live?: Yes      Need for Family Participation in Patient Care: Yes (Comment) Care giver support system in place?: Yes (comment) Current home services: DME Criminal Activity/Legal Involvement Pertinent to Current Situation/Hospitalization: No - Comment as needed  Activities of Daily Living   ADL Screening (condition at time of admission) Independently  performs ADLs?: Yes (appropriate for developmental age) Is the patient deaf or have difficulty hearing?: No Does the patient have difficulty seeing, even when wearing glasses/contacts?: No Does the patient have difficulty concentrating, remembering, or making decisions?: No  Permission Sought/Granted                  Emotional Assessment   Attitude/Demeanor/Rapport: Gracious Affect (typically observed): Accepting Orientation: : Oriented to Self, Oriented to Place, Oriented to  Time, Oriented to Situation Alcohol / Substance Use: Not Applicable Psych Involvement: No (comment)  Admission diagnosis:  Acute on chronic respiratory failure with hypoxia (HCC) [J96.21] Community acquired pneumonia of right lung, unspecified part of lung [J18.9] Patient Active Problem List   Diagnosis Date Noted   Bacteremia due to Streptococcus pneumoniae 11/09/2023   Rhinovirus 11/09/2023   Acute on chronic respiratory failure with hypoxia (HCC) 11/08/2023   Ventilator dependent (HCC) 04/11/2023   Severe persistent asthma, uncomplicated 05/31/2022   S/P shoulder replacement 06/07/2018   S/P reverse total shoulder arthroplasty, 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   Chronic respiratory failure with hypoxia (HCC) 07/14/2017   Diastolic CHF (HCC) 05/11/2017   Pulmonary hypertension (HCC) 07/06/2016   Physical deconditioning 07/06/2016   Pulmonary fibrosis (HCC) 06/13/2016   Hypersensitivity pneumonitis (HCC) 06/13/2016   Rheumatoid arthritis (HCC) 06/12/2015   Acute bronchitis 12/16/2014   Bronchiectasis with (acute) exacerbation (HCC) 06/30/2014   Recurrent acute sinusitis 06/17/2014   Interstitial lung disease (HCC) 06/17/2014   S/P  left TKA 08/13/2013   Obesity 12/17/2010   HTN (hypertension) 12/17/2010   LOW BACK PAIN SYNDROME 07/09/2007   DYSPNEA 07/09/2007   HLD (hyperlipidemia) 04/03/2007   GERD 04/03/2007   PCP:  Jimmey Mould, MD Pharmacy:   [facility] [phone] - [place],  - [address] AT [facility] [address] [facility] [address] [place] [address] Phone: [phone] Fax: [phone]  [facility] - [place], [place] - [address] [place] [address] Phone: [phone] Fax: [phone]  [facility] - [place], [place] - [address] [facility] [address] [place] [address] Phone: [phone] Fax: [phone]  [facility] [place]  - [place], [place] - [address] [place] [address] Phone: [phone] Fax: [phone]     Social Drivers of Health (SDOH) Social History: SDOH Screenings   Food Insecurity: No Food Insecurity (11/10/2023)  Housing: Low Risk  (11/10/2023)  Transportation Needs: No Transportation Needs (11/08/2023)  Utilities: Not At Risk (11/10/2023)  Depression (PHQ2-9): Low Risk  (01/14/2020)  Social Connections: Moderately Integrated (11/08/2023)  Tobacco Use: Low Risk  (11/08/2023)   SDOH Interventions:     Readmission Risk Interventions     No data to display

## 2023-11-10 NOTE — Progress Notes (Signed)
   11/10/23 1756  Provider Notification  Provider Name/Title Dr Butch Cashing  Date Provider Notified 11/10/23  Time Provider Notified 1745  Method of Notification  (secure chat)  Notification Reason New onset of dysrhythmia (Afib with rvr)  Provider response See new orders  Date of Provider Response 11/10/23  Time of Provider Response 1750   Heart rate has been trending upwards since 2pm. Now 130-140s. MD made aware. EGK ordered and showed Afib with RVR. Metoprolol  ordered PRN

## 2023-11-11 DIAGNOSIS — J9621 Acute and chronic respiratory failure with hypoxia: Secondary | ICD-10-CM | POA: Diagnosis not present

## 2023-11-11 DIAGNOSIS — Z515 Encounter for palliative care: Secondary | ICD-10-CM | POA: Diagnosis not present

## 2023-11-11 DIAGNOSIS — J9611 Chronic respiratory failure with hypoxia: Secondary | ICD-10-CM | POA: Diagnosis not present

## 2023-11-11 DIAGNOSIS — J189 Pneumonia, unspecified organism: Secondary | ICD-10-CM

## 2023-11-11 DIAGNOSIS — R7881 Bacteremia: Secondary | ICD-10-CM | POA: Diagnosis not present

## 2023-11-11 DIAGNOSIS — J471 Bronchiectasis with (acute) exacerbation: Secondary | ICD-10-CM | POA: Diagnosis not present

## 2023-11-11 LAB — BASIC METABOLIC PANEL WITH GFR
Anion gap: 9 (ref 5–15)
BUN: 6 mg/dL — ABNORMAL LOW (ref 8–23)
CO2: 32 mmol/L (ref 22–32)
Calcium: 9.1 mg/dL (ref 8.9–10.3)
Chloride: 98 mmol/L (ref 98–111)
Creatinine, Ser: 0.48 mg/dL (ref 0.44–1.00)
GFR, Estimated: >60 mL/min (ref >60)
Glucose, Bld: 123 mg/dL — ABNORMAL HIGH (ref 70–99)
Potassium: 3.8 mmol/L (ref 3.5–5.1)
Sodium: 139 mmol/L (ref 135–145)

## 2023-11-11 LAB — CBC
HCT: 41.9 % (ref 36.0–46.0)
Hemoglobin: 13 g/dL (ref 12.0–15.0)
MCH: 29.2 pg (ref 26.0–34.0)
MCHC: 31 g/dL (ref 30.0–36.0)
MCV: 94.2 fL (ref 80.0–100.0)
Platelets: 362 10*3/uL (ref 150–400)
RBC: 4.45 MIL/uL (ref 3.87–5.11)
RDW: 14.5 % (ref 11.5–15.5)
WBC: 13.2 10*3/uL — ABNORMAL HIGH (ref 4.0–10.5)
nRBC: 0 % (ref 0.0–0.2)

## 2023-11-11 MED ORDER — FUROSEMIDE 20 MG PO TABS
20.0000 mg | ORAL_TABLET | Freq: Once | ORAL | Status: AC
  Administered 2023-11-11: 20 mg via ORAL
  Filled 2023-11-11: qty 1

## 2023-11-11 MED ORDER — DIPHENHYDRAMINE HCL 50 MG/ML IJ SOLN
25.0000 mg | Freq: Once | INTRAMUSCULAR | Status: AC
  Administered 2023-11-11: 25 mg via INTRAVENOUS
  Filled 2023-11-11: qty 1

## 2023-11-11 MED ORDER — DILTIAZEM HCL 30 MG PO TABS
30.0000 mg | ORAL_TABLET | Freq: Four times a day (QID) | ORAL | Status: DC
  Administered 2023-11-11 – 2023-11-13 (×8): 30 mg via ORAL
  Filled 2023-11-11 (×8): qty 1

## 2023-11-11 MED ORDER — ENOXAPARIN SODIUM 80 MG/0.8ML IJ SOSY
1.0000 mg/kg | PREFILLED_SYRINGE | Freq: Two times a day (BID) | INTRAMUSCULAR | Status: DC
  Administered 2023-11-11 – 2023-11-13 (×4): 75 mg via SUBCUTANEOUS
  Filled 2023-11-11 (×5): qty 0.75

## 2023-11-11 MED ORDER — GUAIFENESIN 100 MG/5ML PO LIQD
5.0000 mL | ORAL | Status: DC | PRN
  Administered 2023-11-11 – 2023-11-13 (×3): 5 mL via ORAL
  Filled 2023-11-11 (×3): qty 10

## 2023-11-11 NOTE — Consult Note (Signed)
 Cardiology Consultation   Patient ID: [name] MRN: [id number]; DOB: 06/23/1940  Admit date: 11/08/2023 Date of Consult: 11/11/2023  PCP:  Jimmey Mould, MD   [facility] Providers Cardiologist:  None  Advanced Heart Failure:  Peder Bourdon, MD       Patient Profile: [name] is a 83 y.o. female with a hx of chronic respiratory failure on 24/7 home O2, pulmonary fibrosis, bronchiectasis, rheumatoid arthritis, hypertension, hyperlipidemia, obesity and chronic diastolic heart failure who is being seen 11/11/2023 for the evaluation of atrial fibrillation at the request of Dr. Butch Cashing.  History of Present Illness: [name] is a 83 year old female with past medical history of chronic respiratory failure on 24/7 home O2, pulmonary fibrosis, bronchiectasis, rheumatoid arthritis, hypertension, hyperlipidemia, obesity and chronic diastolic heart failure.  Although chronic diastolic heart failure is listed, however she has not required daily dosing of Lasix  at home nor is she being followed regularly by cardiology service.  She was seen very remotely by Dr. Lavonne Prairie on 12/17/2010 for evaluation of dyspnea.  ETT was ordered at that time.  Suspicion for CAD was very low.  She has been diagnosed with pulmonary fibrosis dating back to around 2008.  She was seen at [facility] in 2016.  Since then, she has been established with Dr. Bertrum Brodie of pulmonology service.  She was referred by Dr. Bertrum Brodie to Dr. Mitzie Anda in 2022 for a left and right heart cath which was performed on 10/14/2020, this showed a minimally elevated PA pressure with PVR only 2.1, normal filling pressure, minimal CAD with 30% ostial left circumflex lesion, luminal irregularities in the LAD and RCA.  Echocardiogram obtained on 06/21/2022 showed EF 55 to 60%, grade 1 DD, RVSP 55.4 mmHg, mild MR, trivial AI.  At baseline, she is on 4 L of nasal cannula, she may increase it to 5 L as needed with exertion.  Family noted  worsening shortness of breath for the past few days.  She had a high-resolution CT that was obtained on 11/07/2023 concerning for multi lobar patchy consolidation indicative of pneumonia, 2.1 cm right thyroid  nodule, underlying interstitial lung disease.  [name] was admitted to [facility] on 11/08/2023.  On initial arrival, sodium was 132, potassium 3.3.  Creatinine 0.68.  BNP 349.  Procalcitonin 1.75.  White blood cell count 11.  Respiratory panel positive for rhinovirus/enterovirus.  Blood culture positive for Streptococcus pneumoniae.  Infectious disease service has been consulted.  Patient is currently on IV antibiotic.  She went into new atrial fibrillation with RVR around 2 PM in the afternoon of 11/10/2023.  At this time, she has no cardiac awareness of atrial fibrillation such as palpitation.  Cardiology service consulted for atrial fibrillation with RVR.  Overnight, she has been given occasional IV Lopressor .   Past Medical History:  Diagnosis Date   Acute on chronic respiratory failure with hypoxia (HCC) 06/13/2016   Allergic rhinitis    takes Claritin  nightly   Anxiety    but not on any meds   Arthritis    Asthma    Carpal tunnel syndrome    CHF (congestive heart failure) (HCC)    patient denies-in office note 05/2015 by Malen Scudder   GERD (gastroesophageal reflux disease)    History of bronchitis 3-45yrs ago   History of colon polyps    benign   Joint pain    Peripheral edema    takes Lasix  daily   Pneumonitis, hypersensitivity (HCC) 05/2016   chronic hypersensitivity pneumonitis- Dr.  McQuad   Pulmonary hypertension (HCC) 06/30/2017   patient denies-in office note from Dr. Michela Aguas-   Shortness of breath dyspnea    with exertion;Albuterol  inhaler as needed   Thyroid  nodule     Past Surgical History:  Procedure Laterality Date   BREAST CYST EXCISION Left 50 yrs ago   CARPELL TUNNELL SURG Right    COLONOSCOPY     ESOPHAGOGASTRODUODENOSCOPY     EYE SURGERY      bilateral cataract surgery with lens implants   KNEE ARTHROSCOPY W/ MENISCAL REPAIR     left   LUNG BIOPSY Right 11/20/2014   Procedure: LUNG BIOPSY;  Surgeon: Zelphia Higashi, MD;  Location: [facility] OR;  Service: Thoracic;  Laterality: Right;   REVERSE SHOULDER ARTHROPLASTY Right 06/07/2018   Procedure: REVERSE SHOULDER ARTHROPLASTY;  Surgeon: Ellard Gunning, MD;  Location: [facility] ORS;  Service: Orthopedics;  Laterality: Right;    RIGHT/LEFT HEART CATH AND CORONARY ANGIOGRAPHY N/A 10/14/2020   Procedure: RIGHT/LEFT HEART CATH AND CORONARY ANGIOGRAPHY;  Surgeon: Darlis Eisenmenger, MD;  Location: [facility] INVASIVE CV LAB;  Service: Cardiovascular;  Laterality: N/A;   TOTAL KNEE ARTHROPLASTY Left 08/13/2013   Procedure: LEFT TOTAL KNEE ARTHROPLASTY;  Surgeon: Bevin Bucks, MD;  Location: [facility] ORS;  Service: Orthopedics;  Laterality: Left;   UMBILICAL HERNIA REPAIR     VIDEO ASSISTED THORACOSCOPY Right 11/20/2014   Procedure: RIGHT  VIDEO ASSISTED THORACOSCOPY WITH WEDGE LUNG BIOPSIES RIGHT UPPER,MIDDLE AND LOWER LOBES ,PLACEMENT OF ON Q PAIN PUMP;  Surgeon: Zelphia Higashi, MD;  Location: [facility] OR;  Service: Thoracic;  Laterality: Right;     Home Medications:  Prior to Admission medications   Medication Sig Start Date End Date Taking? Authorizing Provider  aspirin  325 MG tablet Take 650 mg by mouth daily as needed for moderate pain or headache.   Yes [provider]  Cholecalciferol (DIALYVITE VITAMIN D 5000) 125 MCG (5000 UT) capsule Take 5,000 Units by mouth daily.   Yes [provider]  fluticasone  (FLONASE ) 50 MCG/ACT nasal spray Place 1 spray into both nostrils daily as needed for allergies.    Yes [provider]  furosemide  (LASIX ) 20 MG tablet Take 20 mg by mouth daily as needed for edema. 09/30/13  Yes [provider]  ibuprofen  (ADVIL ) 200 MG tablet Take 200-400 mg by mouth every 6 (six) hours as needed for headache or moderate pain.   Yes [provider]  loratadine  (CLARITIN ) 5 MG chewable tablet Chew 5-10 mg by mouth daily as needed for allergies.    Yes [provider]  Multiple Vitamin (MULTIVITAMIN WITH MINERALS) TABS tablet Take 1 tablet by mouth daily.   Yes [provider]  Nintedanib  (OFEV ) 150 MG CAPS Take 1 capsule (150 mg total) by mouth 2 (two) times daily. 02/15/23  Yes Maire Scot, MD  sodium chloride  HYPERTONIC 3 % nebulizer solution Take by nebulization in the morning and at bedtime. Dx J47.1 Patient taking differently: Take 4 mLs by nebulization as needed for other or cough. 03/10/23  Yes Maire Scot, MD  VENTOLIN  HFA 108 (90 Base) MCG/ACT inhaler Inhale 2 puffs into the lungs every 6 (six) hours as needed for wheezing or shortness of breath. 03/10/23  Yes Maire Scot, MD  vitamin B-12 (CYANOCOBALAMIN) 1000 MCG tablet Take 1,000 mcg by mouth every other day.    [provider]    Scheduled Meds:  enoxaparin  (LOVENOX ) injection  40 mg Subcutaneous Q24H   Nintedanib   1 capsule  Oral BID   sodium chloride  flush  3 mL Intravenous Q12H   sodium chloride  HYPERTONIC  4 mL Nebulization BID   Continuous Infusions:  cefTRIAXone  (ROCEPHIN )  IV Stopped (11/10/23 0939)   PRN Meds: acetaminophen  **OR** acetaminophen , levalbuterol , metoprolol  tartrate, mouth rinse, polyethylene glycol  Allergies:    Allergies  Allergen Reactions   Adhesive [Tape] Itching and Rash   Latex Itching and Swelling   Doxycycline  Rash   Sulfonamide Derivatives Rash and Other (See Comments)    Social History:   Social History   Socioeconomic History   Marital status: Widowed    Spouse name: Not on file   Number of children: 2   Years of education: Not on file   Highest education level: Not on file  Occupational History   Occupation: Retired  Tobacco Use   Smoking status: Never   Smokeless tobacco: Never  Vaping Use   Vaping status: Never Used  Substance and Sexual Activity   Alcohol  use: No    Alcohol/week: 0.0 standard drinks of alcohol   Drug use: No   Sexual activity: Not on file  Other Topics Concern   Not on file  Social History Narrative   Not on file   Social Drivers of Health   Financial Resource Strain: Not on file  Food Insecurity: No Food Insecurity (11/10/2023)   Hunger Vital Sign    Worried About Running Out of Food in the Last Year: Never true    Ran Out of Food in the Last Year: Never true  Transportation Needs: No Transportation Needs (11/08/2023)   PRAPARE - Administrator, Civil Service (Medical): No    Lack of Transportation (Non-Medical): No  Physical Activity: Not on file  Stress: Not on file  Social Connections: Moderately Integrated (11/08/2023)   Social Connection and Isolation Panel [NHANES]    Frequency of Communication with Friends and Family: More than three times a week    Frequency of Social Gatherings with Friends and Family: More than three times a week    Attends Religious Services: More than 4 times per year    Active Member of [employer] or Organizations: Yes    Attends Banker Meetings: More than 4 times per year    Marital Status: Widowed  Intimate Partner Violence: Not At Risk (11/08/2023)   Humiliation, Afraid, Rape, and Kick questionnaire    Fear of Current or Ex-Partner: No    Emotionally Abused: No    Physically Abused: No    Sexually Abused: No    Family History:    Family History  Problem Relation Age of Onset   Tuberculosis Father    Tuberculosis Paternal Uncle      ROS:  Please see the history of present illness.   All other ROS reviewed and negative.     Physical Exam/Data: Vitals:   11/10/23 2323 11/10/23 2329 11/11/23 0300 11/11/23 0757  BP: (!) 106/93  137/63 (!) 141/87  Pulse: (!) 109 (!) 119 (!) 114 94  Resp: 17 (!) 23 20 20   Temp: 97.6 F (36.4 C)  98.2 F (36.8 C) 97.6 F (36.4 C)  TempSrc: Oral  Oral Axillary  SpO2: 97% 97% 95% 98%  Weight:      Height:         Intake/Output Summary (Last 24 hours) at 11/11/2023 0947 Last data filed at 11/11/2023 0823 Gross per 24 hour  Intake 558 ml  Output 350 ml  Net 208 ml  11/08/2023    5:35 PM 11/08/2023   10:42 AM 08/29/2023   11:06 AM  Last 3 Weights  Weight (lbs) 166 lb 10.7 oz 176 lb 176 lb 9.6 oz  Weight (kg) 75.6 kg 79.833 kg 80.105 kg     Body mass index is 27.73 kg/m.  General: Frail, ill-appearing HEENT: normal Neck: no JVD Vascular: No carotid bruits; Distal pulses 2+ bilaterally Cardiac: Tachycardic, irregular; no murmur  Lungs: Bilateral crackles and rhonchi Abd: soft, nontender, no hepatomegaly  Ext: no edema Musculoskeletal:  No deformities, BUE and BLE strength normal and equal Skin: warm and dry  Neuro:  CNs 2-12 intact, no focal abnormalities noted Psych: Difficult to assess, suspect mental status mildly altered  EKG:  The EKG was personally reviewed and demonstrates: Atrial fibrillation with RVR Telemetry:  Telemetry was personally reviewed and demonstrates: Atrial fibrillation started around 2 PM in the afternoon 11/10/2023, currently in A-fib with RVR with heart rate in the 110s to 120s.  Relevant CV Studies:  Left and right heart cath 10/14/2020 1. Minimally elevated PA pressure with PVR only 2.1.  2. Normal filling pressures.  3. No significant coronary disease.    Echo 06/21/2022  1. Left ventricular ejection fraction, by estimation, is 55 to 60%. The  left ventricle has normal function. The left ventricle has no regional  wall motion abnormalities. There is mild concentric left ventricular  hypertrophy. Left ventricular diastolic  parameters are consistent with Grade I diastolic dysfunction (impaired  relaxation).   2. Right ventricular systolic function is normal. The right ventricular  size is normal. There is moderately elevated pulmonary artery systolic  pressure. The estimated right ventricular systolic pressure is 55.4 mmHg.   3. Left atrial size was  moderately dilated.   4. Right atrial size was mildly dilated.   5. The mitral valve is normal in structure. Mild mitral valve  regurgitation. No evidence of mitral stenosis. Moderate mitral annular  calcification.   6. The tricuspid valve is abnormal. Tricuspid valve regurgitation is  moderate.   7. The aortic valve is tricuspid. There is moderate calcification of the  aortic valve. Aortic valve regurgitation is trivial. Aortic valve  sclerosis/calcification is present, without any evidence of aortic  stenosis. Aortic valve mean gradient measures  9.0 mmHg.   8. The inferior vena cava is normal in size with greater than 50%  respiratory variability, suggesting right atrial pressure of 3 mmHg.   Laboratory Data: High Sensitivity Troponin:  No results for input(s): "TROPONINIHS" in the last 720 hours.   Chemistry Recent Labs  Lab 11/08/23 1155 11/08/23 1700 11/09/23 0227 11/11/23 0808  NA 132*  --  135 139  K 3.3*  --  3.7 3.8  CL 93*  --  99 98  CO2 25  --  23 32  GLUCOSE 110*  --  83 123*  BUN 15  --  14 6*  CREATININE 0.68  --  0.79 0.48  CALCIUM 8.6*  --  8.5* 9.1  MG  --  2.0  --   --   GFRNONAA >60  --  >60 >60  ANIONGAP 14  --  13 9    Recent Labs  Lab 11/09/23 0227  PROT 6.0*  ALBUMIN 2.0*  AST 25  ALT 12  ALKPHOS 67  BILITOT 1.9*   Lipids No results for input(s): "CHOL", "TRIG", "HDL", "LABVLDL", "LDLCALC", "CHOLHDL" in the last 168 hours.  Hematology Recent Labs  Lab 11/08/23 1155 11/09/23 0227 11/11/23 0808  WBC  11.0* 13.0* 13.2*  RBC 4.67 4.40 4.45  HGB 13.8 13.1 13.0  HCT 42.9 40.8 41.9  MCV 91.9 92.7 94.2  MCH 29.6 29.8 29.2  MCHC 32.2 32.1 31.0  RDW 14.2 14.4 14.5  PLT 284 297 362   Thyroid  No results for input(s): "TSH", "FREET4" in the last 168 hours.  BNP Recent Labs  Lab 11/08/23 1155  BNP 349.2*    DDimer No results for input(s): "DDIMER" in the last 168 hours.  Radiology/Studies:  CT Chest High Resolution Result Date:  11/10/2023 CLINICAL DATA:  Diffuse/interstitial lung disease. Marked shortness of breath and cough. EXAM: CT CHEST WITHOUT CONTRAST TECHNIQUE: Multidetector CT imaging of the chest was performed following the standard protocol without intravenous contrast. High resolution imaging of the lungs, as well as inspiratory and expiratory imaging, was performed. RADIATION DOSE REDUCTION: This exam was performed according to the departmental dose-optimization program which includes automated exposure control, adjustment of the mA and/or kV according to patient size and/or use of iterative reconstruction technique. COMPARISON:  06/22/2021. FINDINGS: Cardiovascular: Atherosclerotic calcification of the aorta, aortic valve and coronary arteries. Enlarged pulmonic trunk and heart. No pericardial effusion. Mediastinum/Nodes: Low-attenuation right thyroid  nodule measures approximately 2.1 cm. Extensive mediastinal adenopathy with index low right paratracheal lymph node measuring 12 mm (2/55), previously 8 mm. Hilar regions are difficult to definitively evaluate without IV contrast. No axillary adenopathy. Air in the esophagus can be seen with dysmotility. Lungs/Pleura: Patchy consolidation involving all lobes of both lungs precludes evaluation of known underlying interstitial lung disease. Trace left pleural effusion. Airway is unremarkable. Upper Abdomen: Slight nodular thickening of the left adrenal gland. No specific follow-up necessary. Small hiatal hernia. Visualized portions of the liver, adrenal glands, kidneys, spleen, pancreas, stomach and bowel are otherwise grossly unremarkable. No upper abdominal adenopathy. Musculoskeletal: Degenerative changes in the spine. Right shoulder arthroplasty. IMPRESSION: 1. Multilobar patchy consolidation, indicative of pneumonia. 2. Assessment of known underlying interstitial lung disease is challenging in the setting. 3. Mediastinal adenopathy is likely reactive in etiology. 4. Trace left  pleural effusion. 5. 2.1 cm right thyroid  nodule. Given advanced patient age and comorbidities, ultrasound is at the discretion of the referring provider. (Ref: J Am Coll Radiol. 2015 Feb;12(2): 143-50). 6. Aortic atherosclerosis (ICD10-I70.0). Coronary artery calcification. 7. Enlarged pulmonic trunk, indicative of pulmonary arterial hypertension. Electronically Signed   By: Shearon Denis M.D.   On: 11/10/2023 12:41   DG Chest 2 View Result Date: 11/08/2023 CLINICAL DATA:  Shortness of breath, cough, pneumonia EXAM: CHEST - 2 VIEW COMPARISON:  05/26/2021 FINDINGS: Severe chronic opacities throughout the lungs bilaterally, most pronounced diffusely throughout the left lung and in the right upper lobe. These findings are stable since prior imaging and most compatible with severe chronic lung disease/fibrosis. No definite acute process. Heart and mediastinal contours within normal limits. No visible effusions. IMPRESSION: Stable severe chronic interstitial lung disease/fibrosis. No definite acute process. Electronically Signed   By: Janeece Mechanic M.D.   On: 11/08/2023 13:31     Assessment and Plan: Paroxysmal atrial fibrillation with RVR  - CHA2DS2-VASc score 5 given age, female, mild CAD and hypertension  - Currently not on any AV nodal blocking agent other than occasional IV Lopressor .  - Will discuss with MD, potentially started on low-dose 30 mg short acting oral Cardizem for better rate control.  - Patient is very ill, palliative care service has seen the patient.  She is currently DNR except for prearrest intervention  - Not a good candidate for amiodarone  given severe underlying pulmonary fibrosis.  Hopefully once breathing improved, with some low-dose rate control agent, she can self convert.  Heart rate is currently 110-120s.  Acute on chronic respiratory failure  - Admitted for pneumonia in the setting of rhinovirus/enterovirus and rhabdo coccus pneumonia bacteremia.   - No sign of heart  failure.  Consider echocardiogram once heart rate improved  Streptococcus pneumoniae bacteremia: ID on board  Rhinovirus/enterovirus positive: Per primary team  History of pulmonary fibrosis and bronchiectasis: Diagnosed with pulmonary fibrosis since around 2008.  On 4 L nasal cannula at home.  Hypertension Hyperlipidemia    Risk Assessment/Risk Scores:    CHA2DS2-VASc Score = 5   This indicates a 7.2% annual risk of stroke. The patient's score is based upon: CHF History: 0 HTN History: 1 Diabetes History: 0 Stroke History: 0 Vascular Disease History: 1 Age Score: 2 Gender Score: 1        For questions or updates, please contact [facility] Please consult [email or web address] for contact info under    Mikael Albright, [place]  11/11/2023 9:47 AM

## 2023-11-11 NOTE — Progress Notes (Signed)
 Palliative Medicine Progress Note   Patient Name: [name]       Date: 11/11/2023 DOB: Jun 15, 1940  Age: 83 y.o. MRN#: [id number] Attending Physician: Aisha Hove, MD Primary Care Physician: Jimmey Mould, MD Admit Date: 11/08/2023  Reason for Consultation/Follow-up: {Reason for Consult:23484}  HPI/Patient Profile: 83 y.o. female  with past medical history of ILD/pulmonary fibrosis, chronic respiratory failure on home O2, chronic HFpEF, rheumatoid arthritis, pulmonary hypertension, bronchiectasis, hypertension, hyperlipidemia, and GERD who presented to the ED on 11/08/2023 with worsening shortness of breath.  She was sent from pulmonary office given findings on CT concerning for pneumonia.  She is admitted with viral pneumonia and secondary bacterial strep pneumoniae pneumonia and bacteremia.   Palliative Medicine has been consulted for goals of care discussions. Patient and family are faced with anticipatory care needs and complex medical decision making.   Subjective: ***  Objective:  Physical Exam Vitals reviewed.  Constitutional:      General: She is sleeping. She is not in acute distress.    Appearance: She is ill-appearing.  Cardiovascular:     Rate and Rhythm: Tachycardia present. Rhythm irregularly irregular.     Comments: A-fib on monitor           Palliative Medicine Assessment & Plan   Assessment: Principal Problem:   Acute on chronic respiratory failure with hypoxia (HCC) Active Problems:   HLD (hyperlipidemia)   GERD   Obesity   HTN (hypertension)   Interstitial lung disease (HCC)   Bronchiectasis with (acute) exacerbation (HCC)   Rheumatoid arthritis (HCC)   Pulmonary fibrosis (HCC)   Pulmonary hypertension (HCC)   Diastolic CHF (HCC)    Chronic respiratory failure with hypoxia (HCC)   Severe persistent asthma, uncomplicated   Bacteremia due to Streptococcus pneumoniae   Rhinovirus    Recommendations/Plan: ***  Goals of Care and Additional Recommendations: Limitations on Scope of Treatment: {Recommended Scope and Preferences:21019}  Code Status:   Prognosis:  {Palliative Care Prognosis:23504}  Discharge Planning: {Palliative dispostion:23505}  Care plan was discussed with ***  Thank you for allowing the Palliative Medicine Team to assist in the care of this patient.   ***   Wynetta Heckle, NP   Please contact Palliative Medicine Team phone at [phone] for questions and concerns.  For individual providers, please see AMION.

## 2023-11-11 NOTE — Progress Notes (Signed)
 Progress Note   Patient: [name]:[id number] DOB: October 01, 1940 DOA: 11/08/2023     2 DOS: the patient was seen and examined on 11/11/2023   Brief hospital course: [name] is a 83 y.o. female with medical history significant of hypertension, hyperlipidemia, GERD, chronic diastolic CHF, chronic respiratory failure with hypoxia, obesity, asthma, rheumatoid arthritis, pulmonary fibrosis, pulmonary hypertension, bronchiectasis presenting with worsening shortness of breath seen by pulmonologist advised inpatient admission for pneumonia, ILD flare up management.  Assessment and Plan: Acute on chronic hypoxic respiratory failure Bacterial pneumonia Strep bacteremia. ILD/ pulmonary fibrosis exacerbation Rhinovirus, enterovirus positive- Patient is presentation with worsening shortness of breath, productive cough, elevated white count, Pro-Cal. Respiratory viral panel positive for Rhino/ enterovirus. Blood cultures positive for strep pneumonia. Continue to monitor patient in progressive care unit. Continue IV Rocephin  per ID until sensitivities resulted. Repeat blood cultures pending. Continue supplemental oxygen , she wears 4L at home. Continue bronchodilator therapy. PT/ OT follow up. Out of bed to chair, encourage incentive spirometry.  New onset Afib with RVR Due to her acute respiratory infection.  Cardiology evaluation appreciated. Started on Cardizem. She is poor candidate for amiodarone. Started her on Lovenox  1mg  q12hr with plan to transition to Eliquis upon discharge.  Hypokalemia Resolved post replacement.  Chronic respiratory failure on oxygen : Continue home oxygen  and adjust accordingly. Pulmonary follow up as outpatient.   Chronic diastolic CHF Last echo was in January 2024 with EF 55-60%, G1 DD, normal RV function, evidence of pulmonary hypertension. She takes Lasix  20mg  as needed at home. Will give one time Lasix  20mg  today.  Debility: She is very  weak, debilitated due to illness. PT/ OT follow up. Son at bedside shared she was usually healthy prior to her illness. Agrees with DNR. Palliative on board for goals of care discussion.     Out of bed to chair. Incentive spirometry. Nursing supportive care. Fall, aspiration precautions. Diet:  Diet Orders (From admission, onward)     Start     Ordered   11/08/23 1416  Diet Heart Room service appropriate? Yes; Fluid consistency: Thin; Fluid restriction: 2000 mL Fluid  Diet effective now       Question Answer Comment  Room service appropriate? Yes   Fluid consistency: Thin   Fluid restriction: 2000 mL Fluid      11/08/23 1427           DVT prophylaxis: enoxaparin  (LOVENOX ) injection 40 mg Start: 11/08/23 2200  Level of care: Progressive   Code Status: Do not attempt resuscitation (DNR) PRE-ARREST INTERVENTIONS DESIRED  Subjective: Patient is seen and examined today morning. She is sitting in chair. Family at bedside. Very weak, has productive cough. States breathing better.  Physical Exam: Vitals:   11/10/23 2329 11/11/23 0300 11/11/23 0757 11/11/23 1230  BP:  137/63 (!) 141/87 (!) 142/63  Pulse: (!) 119 (!) 114 94 90  Resp: (!) 23 20 20 20   Temp:  98.2 F (36.8 C) 97.6 F (36.4 C) 97.9 F (36.6 C)  TempSrc:  Oral Axillary Oral  SpO2: 97% 95% 98%   Weight:      Height:        General - Elderly ill Caucasian female, mild respiratory distress while resting. HEENT - PERRLA, EOMI, atraumatic head, non tender sinuses. Lung - Clear, diffuse rales, rhonchi, wheezes, using accessory muscles. Heart - S1, S2 heard, no murmurs, rubs, trace pedal edema. Abdomen - Soft, non tender, bowel sounds good Neuro - Alert, awake and oriented x 3, non  focal exam. Skin - Warm and dry.  Data Reviewed:      Latest Ref Rng & Units 11/11/2023    8:08 AM 11/09/2023    2:27 AM 11/08/2023   11:55 AM  CBC  WBC 4.0 - 10.5 K/uL 13.2  13.0  11.0   Hemoglobin 12.0 - 15.0 g/dL 40.9  81.1   91.4   Hematocrit 36.0 - 46.0 % 41.9  40.8  42.9   Platelets 150 - 400 K/uL 362  297  284       Latest Ref Rng & Units 11/11/2023    8:08 AM 11/09/2023    2:27 AM 11/08/2023   11:55 AM  BMP  Glucose 70 - 99 mg/dL 782  83  956   BUN 8 - 23 mg/dL 6  14  15    Creatinine 0.44 - 1.00 mg/dL 2.13  0.86  5.78   Sodium 135 - 145 mmol/L 139  135  132   Potassium 3.5 - 5.1 mmol/L 3.8  3.7  3.3   Chloride 98 - 111 mmol/L 98  99  93   CO2 22 - 32 mmol/L 32  23  25   Calcium 8.9 - 10.3 mg/dL 9.1  8.5  8.6    No results found.   Family Communication: Discussed with patient's family at bedside. They understand and agree.   Disposition: Status is: Inpatient Remains inpatient appropriate because: bacteremia, pneumonia treatment, IV antibiotics, nebs, supplemental O2  Planned Discharge Destination: Home with Home Health     Time spent 42 min  Author: Aisha Hove, MD 11/11/2023 2:29 PM Secure chat 7am to 7pm For on call review [email or web address].

## 2023-11-11 NOTE — Plan of Care (Signed)
   Problem: Education: Goal: Knowledge of General Education information will improve Description: Including pain rating scale, medication(s)/side effects and non-pharmacologic comfort measures Outcome: Not Progressing

## 2023-11-11 NOTE — Progress Notes (Signed)
 Mobility Specialist Progress Note;   11/11/23 1056  Mobility  Activity Transferred from bed to chair  Level of Assistance Contact guard assist, steadying assist  Assistive Device Other (Comment) (HHA)  Distance Ambulated (ft) 5 ft  Activity Response Tolerated well  Mobility Referral Yes  Mobility visit 1 Mobility  Mobility Specialist Start Time (ACUTE ONLY) 1056  Mobility Specialist Stop Time (ACUTE ONLY) 1102  Mobility Specialist Time Calculation (min) (ACUTE ONLY) 6 min   Pt agreeable to mobility. Required MinG for bed mobility and to safely transfer from bed to chair via HHA. VSS on 4LO2 when able to maintain accurate pleth. No c/o when asked. Pt deferred any ambulation, no reason specified. Pt left in chair with all needs met, alarm on. Family present.   Janit Meline Mobility Specialist Please contact via SecureChat or [employer] [phone]

## 2023-11-12 DIAGNOSIS — Z515 Encounter for palliative care: Secondary | ICD-10-CM | POA: Diagnosis not present

## 2023-11-12 DIAGNOSIS — R7881 Bacteremia: Secondary | ICD-10-CM | POA: Diagnosis not present

## 2023-11-12 DIAGNOSIS — J849 Interstitial pulmonary disease, unspecified: Secondary | ICD-10-CM | POA: Diagnosis not present

## 2023-11-12 DIAGNOSIS — J9621 Acute and chronic respiratory failure with hypoxia: Secondary | ICD-10-CM | POA: Diagnosis not present

## 2023-11-12 LAB — CULTURE, BLOOD (ROUTINE X 2)

## 2023-11-12 LAB — CBC
HCT: 41.3 % (ref 36.0–46.0)
Hemoglobin: 13.1 g/dL (ref 12.0–15.0)
MCH: 29.5 pg (ref 26.0–34.0)
MCHC: 31.7 g/dL (ref 30.0–36.0)
MCV: 93 fL (ref 80.0–100.0)
Platelets: 433 10*3/uL — ABNORMAL HIGH (ref 150–400)
RBC: 4.44 MIL/uL (ref 3.87–5.11)
RDW: 14.4 % (ref 11.5–15.5)
WBC: 10.8 10*3/uL — ABNORMAL HIGH (ref 4.0–10.5)
nRBC: 0 % (ref 0.0–0.2)

## 2023-11-12 LAB — BASIC METABOLIC PANEL WITH GFR
Anion gap: 9 (ref 5–15)
BUN: 6 mg/dL — ABNORMAL LOW (ref 8–23)
CO2: 34 mmol/L — ABNORMAL HIGH (ref 22–32)
Calcium: 8.1 mg/dL — ABNORMAL LOW (ref 8.9–10.3)
Chloride: 94 mmol/L — ABNORMAL LOW (ref 98–111)
Creatinine, Ser: 0.45 mg/dL (ref 0.44–1.00)
GFR, Estimated: >60 mL/min (ref >60)
Glucose, Bld: 138 mg/dL — ABNORMAL HIGH (ref 70–99)
Potassium: 3.3 mmol/L — ABNORMAL LOW (ref 3.5–5.1)
Sodium: 137 mmol/L (ref 135–145)

## 2023-11-12 MED ORDER — POTASSIUM CHLORIDE CRYS ER 20 MEQ PO TBCR
40.0000 meq | EXTENDED_RELEASE_TABLET | Freq: Two times a day (BID) | ORAL | Status: AC
  Administered 2023-11-12 (×2): 40 meq via ORAL
  Filled 2023-11-12 (×2): qty 2

## 2023-11-12 MED ORDER — FUROSEMIDE 20 MG PO TABS
20.0000 mg | ORAL_TABLET | Freq: Every day | ORAL | Status: AC
  Administered 2023-11-12 – 2023-11-13 (×2): 20 mg via ORAL
  Filled 2023-11-12 (×2): qty 1

## 2023-11-12 NOTE — Progress Notes (Signed)
 Progress Note   Patient: [name] ZOX:[id number] DOB: Aug 02, 1940 DOA: 11/08/2023     3 DOS: the patient was seen and examined on 11/12/2023   Brief hospital course: [name] is a 83 y.o. female with medical history significant of hypertension, hyperlipidemia, GERD, chronic diastolic CHF, chronic respiratory failure with hypoxia, obesity, asthma, rheumatoid arthritis, pulmonary fibrosis, pulmonary hypertension, bronchiectasis presenting with worsening shortness of breath seen by pulmonologist advised inpatient admission for pneumonia, ILD flare up management.  Assessment and Plan: Acute on chronic hypoxic respiratory failure Bacterial pneumonia Strep bacteremia. ILD/ pulmonary fibrosis exacerbation Rhinovirus, enterovirus positive- WBC improving, she is back on her home O2, breathing better. Respiratory viral panel positive for Rhino/ enterovirus. Blood cultures positive for strep pneumoniae. Susceptibilities to follow. Continue to monitor patient in progressive care unit. Continue IV Rocephin  per ID until sensitivities resulted. Repeat blood cultures pending. Continue supplemental oxygen , she wears 4L at home. Continue bronchodilator therapy. PT/ OT follow up. Out of bed to chair, encourage incentive spirometry.  New onset Afib with RVR Due to her acute respiratory infection.  Cardiology evaluation appreciated. Started on Cardizem. She is poor candidate for amiodarone or EP procedures. Continue Lovenox  1mg  q12hr with plan to transition to Eliquis upon discharge.  Hypokalemia Due to diuresis. Continue to replace as needed.   Chronic diastolic CHF Last echo was in January 2024 with EF 55-60%, G1 DD, normal RV function, evidence of pulmonary hypertension. She takes Lasix  20mg  as needed at home. Got Lasix  yesterday, will give one time today. Monitor potassium and replete.  Debility: She is weak and debilitated due to illness. PT/ OT follow up. She walked with PT  today about 50 feet.     Out of bed to chair. Incentive spirometry. Nursing supportive care. Fall, aspiration precautions. Diet:  Diet Orders (From admission, onward)     Start     Ordered   11/08/23 1416  Diet Heart Room service appropriate? Yes; Fluid consistency: Thin; Fluid restriction: 2000 mL Fluid  Diet effective now       Question Answer Comment  Room service appropriate? Yes   Fluid consistency: Thin   Fluid restriction: 2000 mL Fluid      11/08/23 1427           DVT prophylaxis:   Level of care: Progressive   Code Status: Do not attempt resuscitation (DNR) PRE-ARREST INTERVENTIONS DESIRED  Subjective: Patient is seen and examined today morning. She is sitting in chair. Son at bedside. Feels better today, cough improved. Walked with PT.  Physical Exam: Vitals:   11/12/23 0313 11/12/23 0608 11/12/23 0745 11/12/23 1130  BP: (!) 133/53 (!) 125/45 128/61 (!) 141/79  Pulse: (!) 109  99 (!) 110  Resp: 20  20 20   Temp: 97.7 F (36.5 C)  (!) 97.5 F (36.4 C) 97.9 F (36.6 C)  TempSrc: Oral  Oral Axillary  SpO2: 99%  95% 95%  Weight:      Height:        General - Elderly ill Caucasian female, mild respiratory distress. HEENT - PERRLA, EOMI, atraumatic head, non tender sinuses. Lung - Clear, diffuse rales, rhonchi, wheezes. Heart - S1, S2 heard, no murmurs, rubs, trace pedal edema. Abdomen - Soft, non tender, bowel sounds good Neuro - Alert, awake and oriented x 3, non focal exam. Skin - Warm and dry.  Data Reviewed:      Latest Ref Rng & Units 11/12/2023    9:06 AM 11/11/2023    8:08 AM  11/09/2023    2:27 AM  CBC  WBC 4.0 - 10.5 K/uL 10.8  13.2  13.0   Hemoglobin 12.0 - 15.0 g/dL 16.1  09.6  04.5   Hematocrit 36.0 - 46.0 % 41.3  41.9  40.8   Platelets 150 - 400 K/uL 433  362  297       Latest Ref Rng & Units 11/12/2023    9:06 AM 11/11/2023    8:08 AM 11/09/2023    2:27 AM  BMP  Glucose 70 - 99 mg/dL 409  811  83   BUN 8 - 23 mg/dL 6  6  14     Creatinine 0.44 - 1.00 mg/dL 9.14  7.82  9.56   Sodium 135 - 145 mmol/L 137  139  135   Potassium 3.5 - 5.1 mmol/L 3.3  3.8  3.7   Chloride 98 - 111 mmol/L 94  98  99   CO2 22 - 32 mmol/L 34  32  23   Calcium 8.9 - 10.3 mg/dL 8.1  9.1  8.5    No results found.   Family Communication: Discussed with patient and her son at bedside. They understand and agree.   Disposition: Status is: Inpatient Remains inpatient appropriate because: bacteremia, pneumonia treatment, IV antibiotics, nebs  Planned Discharge Destination: Home with Home Health     Time spent 40 min  Author: Aisha Hove, MD 11/12/2023 11:32 AM Secure chat 7am to 7pm For on call review [email or web address].

## 2023-11-12 NOTE — Progress Notes (Signed)
   Rounding Note    Patient Name: [name] Date of Encounter: 11/12/2023  [facility] Cardiologist: None   Subjective   NAEO. Breathing better this AM. Family at bedside.  Vital Signs    Vitals:   11/11/23 2253 11/12/23 0313 11/12/23 0608 11/12/23 0745  BP: (!) 140/91 (!) 133/53 (!) 125/45 128/61  Pulse: 91 (!) 109  99  Resp: 20 20  20   Temp: 97.6 F (36.4 C) 97.7 F (36.5 C)  (!) 97.5 F (36.4 C)  TempSrc: Oral Oral  Oral  SpO2: 94% 99%  95%  Weight:      Height:        Intake/Output Summary (Last 24 hours) at 11/12/2023 1044 Last data filed at 11/12/2023 0751 Gross per 24 hour  Intake 460 ml  Output 1050 ml  Net -590 ml      11/08/2023    5:35 PM 11/08/2023   10:42 AM 08/29/2023   11:06 AM  Last 3 Weights  Weight (lbs) 166 lb 10.7 oz 176 lb 176 lb 9.6 oz  Weight (kg) 75.6 kg 79.833 kg 80.105 kg      Telemetry    Rates in the 100-120 range.  Personally Reviewed  ECG    Personally Reviewed  Physical Exam   GEN: No acute distress.  Elderly. Chronically ill appearing. Cardiac: irregularly irregular Respiratory: Coarse crackles and wheezing throughout the lung fields. Similar to yesterday's exam. Psych: Normal affect   Assessment & Plan    #AF w/ RVR OAC prior to discharge, continue heparin  while inpatient. Cont cardizem for rate control Not a candidate for invasive EP procedures  EP will sign off. Please call with questions/concerns.    Donelda Fujita T. Marven Slimmer, MD, [facility], [facility] Cardiac Electrophysiology

## 2023-11-12 NOTE — Progress Notes (Signed)
 Mobility Specialist Progress Note;   11/12/23 0924  Mobility  Activity Ambulated with assistance in hallway  Level of Assistance Contact guard assist, steadying assist  Assistive Device Four wheel walker  Distance Ambulated (ft) 40 ft  Activity Response Tolerated well  Mobility Referral Yes  Mobility visit 1 Mobility  Mobility Specialist Start Time (ACUTE ONLY) [id number]  Mobility Specialist Stop Time (ACUTE ONLY) 0940  Mobility Specialist Time Calculation (min) (ACUTE ONLY) 16 min   Pt in chair upon arrival, agreeable to mobility. On 4LO2 upon arrival. Required light MinG assistance during ambulation for safety. Able to ambulate on 4LO2, SPO2 WFL. No c/o when asked. Pt returned back to chair with all needs met, alarm on. Son in room.   Janit Meline Mobility Specialist Please contact via SecureChat or [employer] [phone]

## 2023-11-12 NOTE — Plan of Care (Signed)
   Problem: Education: Goal: Knowledge of General Education information will improve Description: Including pain rating scale, medication(s)/side effects and non-pharmacologic comfort measures Outcome: Not Progressing

## 2023-11-13 ENCOUNTER — Telehealth (HOSPITAL_COMMUNITY): Payer: Self-pay | Admitting: Pharmacy Technician

## 2023-11-13 ENCOUNTER — Other Ambulatory Visit (HOSPITAL_COMMUNITY): Payer: Self-pay

## 2023-11-13 DIAGNOSIS — R7881 Bacteremia: Secondary | ICD-10-CM | POA: Diagnosis not present

## 2023-11-13 DIAGNOSIS — I4891 Unspecified atrial fibrillation: Secondary | ICD-10-CM | POA: Diagnosis not present

## 2023-11-13 DIAGNOSIS — J849 Interstitial pulmonary disease, unspecified: Secondary | ICD-10-CM | POA: Diagnosis not present

## 2023-11-13 DIAGNOSIS — J13 Pneumonia due to Streptococcus pneumoniae: Secondary | ICD-10-CM | POA: Diagnosis not present

## 2023-11-13 DIAGNOSIS — J9621 Acute and chronic respiratory failure with hypoxia: Secondary | ICD-10-CM | POA: Diagnosis not present

## 2023-11-13 DIAGNOSIS — I272 Pulmonary hypertension, unspecified: Secondary | ICD-10-CM | POA: Diagnosis not present

## 2023-11-13 LAB — BASIC METABOLIC PANEL WITH GFR
Anion gap: 9 (ref 5–15)
BUN: 7 mg/dL — ABNORMAL LOW (ref 8–23)
CO2: 35 mmol/L — ABNORMAL HIGH (ref 22–32)
Calcium: 8 mg/dL — ABNORMAL LOW (ref 8.9–10.3)
Chloride: 94 mmol/L — ABNORMAL LOW (ref 98–111)
Creatinine, Ser: 0.35 mg/dL — ABNORMAL LOW (ref 0.44–1.00)
GFR, Estimated: >60 mL/min (ref >60)
Glucose, Bld: 164 mg/dL — ABNORMAL HIGH (ref 70–99)
Potassium: 3.6 mmol/L (ref 3.5–5.1)
Sodium: 138 mmol/L (ref 135–145)

## 2023-11-13 LAB — CBC
HCT: 40.4 % (ref 36.0–46.0)
Hemoglobin: 12.7 g/dL (ref 12.0–15.0)
MCH: 29.3 pg (ref 26.0–34.0)
MCHC: 31.4 g/dL (ref 30.0–36.0)
MCV: 93.1 fL (ref 80.0–100.0)
Platelets: 466 10*3/uL — ABNORMAL HIGH (ref 150–400)
RBC: 4.34 MIL/uL (ref 3.87–5.11)
RDW: 14.1 % (ref 11.5–15.5)
WBC: 11.1 10*3/uL — ABNORMAL HIGH (ref 4.0–10.5)
nRBC: 0 % (ref 0.0–0.2)

## 2023-11-13 LAB — CULTURE, BLOOD (ROUTINE X 2)

## 2023-11-13 MED ORDER — DILTIAZEM HCL ER COATED BEADS 120 MG PO CP24
120.0000 mg | ORAL_CAPSULE | Freq: Every day | ORAL | Status: DC
  Administered 2023-11-13: 120 mg via ORAL
  Filled 2023-11-13: qty 1

## 2023-11-13 MED ORDER — LINEZOLID 600 MG PO TABS
600.0000 mg | ORAL_TABLET | Freq: Two times a day (BID) | ORAL | 0 refills | Status: DC
  Filled 2023-11-13: qty 10, 5d supply, fill #0

## 2023-11-13 MED ORDER — APIXABAN 5 MG PO TABS
5.0000 mg | ORAL_TABLET | Freq: Two times a day (BID) | ORAL | 1 refills | Status: DC
  Filled 2023-11-13: qty 60, 30d supply, fill #0

## 2023-11-13 MED ORDER — DILTIAZEM HCL ER COATED BEADS 120 MG PO CP24
120.0000 mg | ORAL_CAPSULE | Freq: Every day | ORAL | 1 refills | Status: DC
  Filled 2023-11-13: qty 30, 30d supply, fill #0

## 2023-11-13 MED ORDER — GUAIFENESIN 100 MG/5ML PO LIQD
5.0000 mL | ORAL | 0 refills | Status: AC | PRN
  Filled 2023-11-13: qty 118, 4d supply, fill #0

## 2023-11-13 NOTE — Progress Notes (Signed)
 PT Cancellation Note  Patient Details Name: [name] MRN: [id number] DOB: 09-Sep-1940   Cancelled Treatment:    Reason Eval/Treat Not Completed: Fatigue/lethargy limiting ability to participate  Per family, pt just fell asleep and did not rest well overnight. She recently was up/back to bathroom. They requested to not awaken her at this time. Will try to see in p.m. as schedule permits.    Gayle Kava, PT Acute Rehabilitation Services  Office [phone]  [place] Leep 11/13/2023, 11:14 AM

## 2023-11-13 NOTE — Progress Notes (Signed)
 Occupational Therapy Treatment Patient Details Name: [name] MRN: [id number] DOB: November 26, 1940 Today's Date: 11/13/2023   History of present illness 83 y.o. female presenting with worsening 6/4 with shortness of breath, seen by pulmonologist advised inpatient admission. Dx: Acute on chronic hypoxic respiratory failure Bacterial pneumonia ILD/ pulmonary fibrosis exacerbation Rhinovirus, enterovirus positive. PMH: hypertension, hyperlipidemia, GERD, chronic diastolic CHF, chronic respiratory failure with hypoxia, obesity, asthma, rheumatoid arthritis, pulmonary fibrosis, pulmonary hypertension, bronchiectasis.   OT comments  Pt making incremental progress towards OT goals though remains limited by cardiopulmonary endurance deficits. Pt able to manage bed mobility, mobility to/from sink using RW and brief standing ADLs w/ no more than CGA for safety. Pt able to indicate need for seated rest breaks noted by rising HR to 130s with activity. SpO2 92% on 4 L O2. Emphasized energy conservation strategies with handout provided. Granddaughter at bedside endorses multiple family members in the area that can assist as needed. Continue to recommend HHOT at DC.      If plan is discharge home, recommend the following:  Assistance with cooking/housework;A little help with bathing/dressing/bathroom;Help with stairs or ramp for entrance;Assist for transportation   Equipment Recommendations  None recommended by OT    Recommendations for Other Services      Precautions / Restrictions Precautions Precautions: Fall Precaution/Restrictions Comments: Monitor O2 and HR Restrictions Weight Bearing Restrictions Per Provider Order: No       Mobility Bed Mobility Overal bed mobility: Needs Assistance Bed Mobility: Supine to Sit, Sit to Supine     Supine to sit: Supervision, HOB elevated, Used rails Sit to supine: Supervision        Transfers Overall transfer level: Needs assistance Equipment  used: Rolling walker (2 wheels), None Transfers: Sit to/from Stand Sit to Stand: Contact guard assist           General transfer comment: CGA for safety but pt able to stand from bedside with and without RW w/o assistance     Balance Overall balance assessment: Needs assistance Sitting-balance support: No upper extremity supported, Feet supported Sitting balance-Leahy Scale: Good     Standing balance support: No upper extremity supported, During functional activity, Bilateral upper extremity supported Standing balance-Leahy Scale: Fair                             ADL either performed or assessed with clinical judgement   ADL Overall ADL's : Needs assistance/impaired     Grooming: Supervision/safety;Standing;Wash/dry face Grooming Details (indicate cue type and reason): standing at sink > 3 min before seated rest break needed. Noted increase in HR though SpO2 WFL                             Functional mobility during ADLs: Contact guard assist;Rolling walker (2 wheels) General ADL Comments: Emphasis on energy conservation with handout provided. Discussed self monitoring when rest breaks needed (pt able to do so today), modifying ADLs/IADLs as needed, taking advantage of good days and reinforced modification on bad days. Granddaughter at bedside - reports family involved and available to assist as needed - already discussed transporation assist at DC    Extremity/Trunk Assessment Upper Extremity Assessment Upper Extremity Assessment: Right hand dominant;Overall [facility] for tasks assessed   Lower Extremity Assessment Lower Extremity Assessment: Defer to PT evaluation        Vision   Vision Assessment?: No apparent visual deficits  Perception     Praxis     Communication Communication Communication: No apparent difficulties   Cognition Arousal: Alert Behavior During Therapy: WFL for tasks assessed/performed Cognition: Cognition impaired        Memory impairment (select all impairments): Short-term memory     OT - Cognition Comments: minor memory deficits but close to baseline. able to report geneology studies, name great grandchildren, etc                 Following commands: Impaired Following commands impaired: Follows multi-step commands with increased time, Only follows one step commands consistently      Cueing   Cueing Techniques: Verbal cues  Exercises      Shoulder Instructions       General Comments Granddaughter present. Educated pt and family on SPO2 readings, good pleth signals and HR response to activity    Pertinent Vitals/ Pain       Pain Assessment Pain Assessment: No/denies pain  Home Living                                          Prior Functioning/Environment              Frequency  Min 2X/week        Progress Toward Goals  OT Goals(current goals can now be found in the care plan section)  Progress towards OT goals: Progressing toward goals  Acute Rehab OT Goals Patient Stated Goal: go home soon OT Goal Formulation: With patient Time For Goal Achievement: 11/23/23 Potential to Achieve Goals: Good ADL Goals Pt Will Perform Grooming: with supervision;standing Pt Will Perform Lower Body Dressing: with supervision;sit to/from stand Pt Will Transfer to Toilet: with supervision;ambulating Pt Will Perform Toileting - Clothing Manipulation and hygiene: with supervision;sit to/from stand Pt Will Perform Tub/Shower Transfer: Shower transfer;ambulating Additional ADL Goal #1: Pt will demonstrate independent use of energy conservation strategies prn.  Plan      Co-evaluation                 AM-PAC OT "6 Clicks" Daily Activity     Outcome Measure   Help from another person eating meals?: None Help from another person taking care of personal grooming?: A Little Help from another person toileting, which includes using toliet, bedpan, or urinal?: A  Little Help from another person bathing (including washing, rinsing, drying)?: A Little Help from another person to put on and taking off regular upper body clothing?: A Little Help from another person to put on and taking off regular lower body clothing?: A Little 6 Click Score: 19    End of Session Equipment Utilized During Treatment: Gait belt;Rolling walker (2 wheels);Oxygen   OT Visit Diagnosis: Unsteadiness on feet (R26.81);Other abnormalities of gait and mobility (R26.89);Muscle weakness (generalized) (M62.81);Other (comment);Other symptoms and signs involving cognitive function   Activity Tolerance Patient tolerated treatment well;Patient limited by fatigue   Patient Left in bed;with call bell/phone within reach;with bed alarm set;with family/visitor present   Nurse Communication          Time: 8119-1478 OT Time Calculation (min): 28 min  Charges: OT General Charges $OT Visit: 1 Visit OT Treatments $Self Care/Home Management : 8-22 mins $Therapeutic Activity: 8-22 mins  Lawrence Pretty, OTR/L Acute Rehab Services Office: [phone]   Shireen Dory 11/13/2023, 1:59 PM

## 2023-11-13 NOTE — Discharge Summary (Signed)
 Physician Discharge Summary   Patient: [name] MRN: [id number] DOB: May 05, 1941  Admit date:     11/08/2023  Discharge date: 11/13/23  Discharge Physician: Aisha Hove   PCP: Jimmey Mould, MD   Recommendations at discharge:  {Tip this will not be part of the note when signed- Example include specific recommendations for outpatient follow-up, pending tests to follow-up on. (Optional):26781}  PCP follow up in 1 week.   Discharge Diagnoses: Principal Problem:   Acute on chronic respiratory failure with hypoxia (HCC) Active Problems:   HLD (hyperlipidemia)   GERD   Obesity   HTN (hypertension)   Interstitial lung disease (HCC)   Bronchiectasis with (acute) exacerbation (HCC)   Rheumatoid arthritis (HCC)   Pulmonary fibrosis (HCC)   Pulmonary hypertension (HCC)   Diastolic CHF (HCC)   Chronic respiratory failure with hypoxia (HCC)   Severe persistent asthma, uncomplicated   Bacteremia due to Streptococcus pneumoniae   Rhinovirus  Resolved Problems:   * No resolved hospital problems. [facility] Course: No notes on file  Assessment and Plan: No notes have been filed under this hospital service. Service: Hospitalist     {Tip this will not be part of the note when signed Body mass index is 27.73 kg/m. , ,  (Optional):26781}  {(NOTE) Pain control PDMP Statment (Optional):26782} Consultants: Pulmonology, Cardiology Procedures performed: ***  Disposition: {Plan; Disposition:26390} Diet recommendation:  Discharge Diet Orders (From admission, onward)     Start     Ordered   11/13/23 0000  Diet - low sodium heart healthy        11/13/23 1256           {Diet_Plan:26776} DISCHARGE MEDICATION: Allergies as of 11/13/2023       Reactions   Adhesive [tape] Itching, Rash   Latex Itching, Swelling   Doxycycline  Rash   Sulfonamide Derivatives Rash, Other (See Comments)        Medication List     STOP taking these medications     aspirin  325 MG tablet   ibuprofen  200 MG tablet Commonly known as: ADVIL        TAKE these medications    apixaban 5 MG Tabs tablet Commonly known as: Eliquis Take 1 tablet (5 mg total) by mouth 2 (two) times daily.   cyanocobalamin 1000 MCG tablet Commonly known as: VITAMIN B12 Take 1,000 mcg by mouth every other day.   Dialyvite Vitamin D 5000 125 MCG (5000 UT) capsule Generic drug: Cholecalciferol Take 5,000 Units by mouth daily.   diltiazem 120 MG 24 hr capsule Commonly known as: CARDIZEM CD Take 1 capsule (120 mg total) by mouth daily. Start taking on: November 14, 2023   fluticasone  50 MCG/ACT nasal spray Commonly known as: FLONASE  Place 1 spray into both nostrils daily as needed for allergies.   furosemide  20 MG tablet Commonly known as: LASIX  Take 20 mg by mouth daily as needed for edema.   guaiFENesin  100 MG/5ML liquid Commonly known as: ROBITUSSIN Take 5 mLs by mouth every 4 (four) hours as needed for cough or to loosen phlegm.   linezolid 600 MG tablet Commonly known as: ZYVOX Take 1 tablet (600 mg total) by mouth 2 (two) times daily. Start taking on: November 14, 2023   loratadine  5 MG chewable tablet Commonly known as: CLARITIN  Chew 5-10 mg by mouth daily as needed for allergies.   multivitamin with minerals Tabs tablet Take 1 tablet by mouth daily.   Ofev  150 MG Caps Generic drug: Nintedanib  Take  1 capsule (150 mg total) by mouth 2 (two) times daily.   sodium chloride  HYPERTONIC 3 % nebulizer solution Take by nebulization in the morning and at bedtime. Dx J47.1 What changed:  how much to take when to take this reasons to take this additional instructions   Ventolin  HFA 108 (90 Base) MCG/ACT inhaler Generic drug: albuterol  Inhale 2 puffs into the lungs every 6 (six) hours as needed for wheezing or shortness of breath.        Follow-up Information     [facility] Follow up.   Why: Home health has been arranged. They  will contact you to schedule apt within 48 hrs post discharge Contact information: [address] [place] [address] [phone]                Discharge Exam: Filed Weights   11/08/23 1735  Weight: 75.6 kg   ***  Condition at discharge: {DC Condition:26389}  The results of significant diagnostics from this hospitalization (including imaging, microbiology, ancillary and laboratory) are listed below for reference.   Imaging Studies: CT Chest High Resolution Result Date: 11/10/2023 CLINICAL DATA:  Diffuse/interstitial lung disease. Marked shortness of breath and cough. EXAM: CT CHEST WITHOUT CONTRAST TECHNIQUE: Multidetector CT imaging of the chest was performed following the standard protocol without intravenous contrast. High resolution imaging of the lungs, as well as inspiratory and expiratory imaging, was performed. RADIATION DOSE REDUCTION: This exam was performed according to the departmental dose-optimization program which includes automated exposure control, adjustment of the mA and/or kV according to patient size and/or use of iterative reconstruction technique. COMPARISON:  06/22/2021. FINDINGS: Cardiovascular: Atherosclerotic calcification of the aorta, aortic valve and coronary arteries. Enlarged pulmonic trunk and heart. No pericardial effusion. Mediastinum/Nodes: Low-attenuation right thyroid  nodule measures approximately 2.1 cm. Extensive mediastinal adenopathy with index low right paratracheal lymph node measuring 12 mm (2/55), previously 8 mm. Hilar regions are difficult to definitively evaluate without IV contrast. No axillary adenopathy. Air in the esophagus can be seen with dysmotility. Lungs/Pleura: Patchy consolidation involving all lobes of both lungs precludes evaluation of known underlying interstitial lung disease. Trace left pleural effusion. Airway is unremarkable. Upper Abdomen: Slight nodular thickening of the left adrenal gland. No specific follow-up  necessary. Small hiatal hernia. Visualized portions of the liver, adrenal glands, kidneys, spleen, pancreas, stomach and bowel are otherwise grossly unremarkable. No upper abdominal adenopathy. Musculoskeletal: Degenerative changes in the spine. Right shoulder arthroplasty. IMPRESSION: 1. Multilobar patchy consolidation, indicative of pneumonia. 2. Assessment of known underlying interstitial lung disease is challenging in the setting. 3. Mediastinal adenopathy is likely reactive in etiology. 4. Trace left pleural effusion. 5. 2.1 cm right thyroid  nodule. Given advanced patient age and comorbidities, ultrasound is at the discretion of the referring provider. (Ref: J Am Coll Radiol. 2015 Feb;12(2): 143-50). 6. Aortic atherosclerosis (ICD10-I70.0). Coronary artery calcification. 7. Enlarged pulmonic trunk, indicative of pulmonary arterial hypertension. Electronically Signed   By: Shearon Denis M.D.   On: 11/10/2023 12:41   DG Chest 2 View Result Date: 11/08/2023 CLINICAL DATA:  Shortness of breath, cough, pneumonia EXAM: CHEST - 2 VIEW COMPARISON:  05/26/2021 FINDINGS: Severe chronic opacities throughout the lungs bilaterally, most pronounced diffusely throughout the left lung and in the right upper lobe. These findings are stable since prior imaging and most compatible with severe chronic lung disease/fibrosis. No definite acute process. Heart and mediastinal contours within normal limits. No visible effusions. IMPRESSION: Stable severe chronic interstitial lung disease/fibrosis. No definite  acute process. Electronically Signed   By: Janeece Mechanic M.D.   On: 11/08/2023 13:31    Microbiology: Results for orders placed or performed during the hospital encounter of 11/08/23  Culture, blood (routine x 2)     Status: Abnormal   Collection Time: 11/08/23 12:35 PM   Specimen: BLOOD  Result Value Ref Range Status   Specimen Description BLOOD SITE NOT SPECIFIED  Final   Special Requests   Final    BOTTLES DRAWN  AEROBIC AND ANAEROBIC Blood Culture results may not be optimal due to an inadequate volume of blood received in culture bottles   Culture  Setup Time   Final    GRAM POSITIVE COCCI IN BOTH AEROBIC AND ANAEROBIC BOTTLES CRITICAL RESULT CALLED TO, READ BACK BY AND VERIFIED WITH: [facility]  11/09/23 MK Performed at [facility], [address], [place], [place] [address]    Culture STREPTOCOCCUS PNEUMONIAE (A)  Final   Report Status 11/12/2023 FINAL  Final   Organism ID, Bacteria STREPTOCOCCUS PNEUMONIAE  Final      Susceptibility   Streptococcus pneumoniae - MIC*    ERYTHROMYCIN >=8 RESISTANT Resistant     LEVOFLOXACIN 0.5 SENSITIVE Sensitive     VANCOMYCIN 0.5 SENSITIVE Sensitive     PENICILLIN (meningitis) 1 RESISTANT Resistant     PENO - penicillin 1      PENICILLIN (non-meningitis) 1 SENSITIVE Sensitive     PENICILLIN (oral) 1 INTERMEDIATE Intermediate     CEFTRIAXONE  (non-meningitis) 1 SENSITIVE Sensitive     CEFTRIAXONE  (meningitis) 1 INTERMEDIATE Intermediate     * STREPTOCOCCUS PNEUMONIAE  Blood Culture ID Panel (Reflexed)     Status: Abnormal   Collection Time: 11/08/23 12:35 PM  Result Value Ref Range Status   Enterococcus faecalis NOT DETECTED NOT DETECTED Final   Enterococcus Faecium NOT DETECTED NOT DETECTED Final   Listeria monocytogenes NOT DETECTED NOT DETECTED Final   Staphylococcus species NOT DETECTED NOT DETECTED Final   Staphylococcus aureus (BCID) NOT DETECTED NOT DETECTED Final   Staphylococcus epidermidis NOT DETECTED NOT DETECTED Final   Staphylococcus lugdunensis NOT DETECTED NOT DETECTED Final   Streptococcus species DETECTED (A) NOT DETECTED Final    Comment: CRITICAL RESULT CALLED TO, READ BACK BY AND VERIFIED WITH: J WYLAND,PHARMD@0640  11/09/23 MK    Streptococcus agalactiae NOT DETECTED NOT DETECTED Final   Streptococcus pneumoniae DETECTED (A) NOT DETECTED Final    Comment: CRITICAL RESULT CALLED TO, READ BACK BY AND VERIFIED  WITH: J WYLAND,PHARMD@0640  11/09/23 MK    Streptococcus pyogenes NOT DETECTED NOT DETECTED Final   A.calcoaceticus-baumannii NOT DETECTED NOT DETECTED Final   Bacteroides fragilis NOT DETECTED NOT DETECTED Final   Enterobacterales NOT DETECTED NOT DETECTED Final   Enterobacter cloacae complex NOT DETECTED NOT DETECTED Final   Escherichia coli NOT DETECTED NOT DETECTED Final   Klebsiella aerogenes NOT DETECTED NOT DETECTED Final   Klebsiella oxytoca NOT DETECTED NOT DETECTED Final   Klebsiella pneumoniae NOT DETECTED NOT DETECTED Final   Proteus species NOT DETECTED NOT DETECTED Final   Salmonella species NOT DETECTED NOT DETECTED Final   Serratia marcescens NOT DETECTED NOT DETECTED Final   Haemophilus influenzae NOT DETECTED NOT DETECTED Final   Neisseria meningitidis NOT DETECTED NOT DETECTED Final   Pseudomonas aeruginosa NOT DETECTED NOT DETECTED Final   Stenotrophomonas maltophilia NOT DETECTED NOT DETECTED Final   Candida albicans NOT DETECTED NOT DETECTED Final   Candida auris NOT DETECTED NOT DETECTED Final   Candida glabrata NOT DETECTED NOT DETECTED Final  Candida krusei NOT DETECTED NOT DETECTED Final   Candida parapsilosis NOT DETECTED NOT DETECTED Final   Candida tropicalis NOT DETECTED NOT DETECTED Final   Cryptococcus neoformans/gattii NOT DETECTED NOT DETECTED Final    Comment: Performed at [facility], [address], [place], [place] [address]  Respiratory (~20 pathogens) panel by PCR     Status: Abnormal   Collection Time: 11/08/23  2:16 PM   Specimen: Nasopharyngeal Swab; Respiratory  Result Value Ref Range Status   Adenovirus NOT DETECTED NOT DETECTED Final   Coronavirus 229E NOT DETECTED NOT DETECTED Final    Comment: (NOTE) The Coronavirus on the Respiratory Panel, DOES NOT test for the novel  Coronavirus (2019 nCoV)    Coronavirus HKU1 NOT DETECTED NOT DETECTED Final   Coronavirus NL63 NOT DETECTED NOT DETECTED Final   Coronavirus OC43 NOT  DETECTED NOT DETECTED Final   Metapneumovirus NOT DETECTED NOT DETECTED Final   Rhinovirus / Enterovirus DETECTED (A) NOT DETECTED Final   Influenza A NOT DETECTED NOT DETECTED Final   Influenza B NOT DETECTED NOT DETECTED Final   Parainfluenza Virus 1 NOT DETECTED NOT DETECTED Final   Parainfluenza Virus 2 NOT DETECTED NOT DETECTED Final   Parainfluenza Virus 3 NOT DETECTED NOT DETECTED Final   Parainfluenza Virus 4 NOT DETECTED NOT DETECTED Final   Respiratory Syncytial Virus NOT DETECTED NOT DETECTED Final   Bordetella pertussis NOT DETECTED NOT DETECTED Final   Bordetella Parapertussis NOT DETECTED NOT DETECTED Final   Chlamydophila pneumoniae NOT DETECTED NOT DETECTED Final   Mycoplasma pneumoniae NOT DETECTED NOT DETECTED Final    Comment: Performed at [facility], [address] [place], [place] [address]  Culture, blood (routine x 2)     Status: Abnormal   Collection Time: 11/08/23  5:00 PM   Specimen: BLOOD  Result Value Ref Range Status   Specimen Description BLOOD LEFT ANTECUBITAL  Final   Special Requests   Final    BOTTLES DRAWN AEROBIC ONLY Blood Culture results may not be optimal due to an inadequate volume of blood received in culture bottles   Culture  Setup Time   Final    GRAM POSITIVE COCCI AEROBIC BOTTLE ONLY CRITICAL VALUE NOTED.  VALUE IS CONSISTENT WITH PREVIOUSLY REPORTED AND CALLED VALUE.    Culture (A)  Final    STREPTOCOCCUS PNEUMONIAE SUSCEPTIBILITIES PERFORMED ON PREVIOUS CULTURE WITHIN THE LAST 5 DAYS. Performed at [facility], [address], [place], [place] [address]    Report Status 11/12/2023 FINAL  Final  MRSA Next Gen by PCR, Nasal     Status: None   Collection Time: 11/08/23  5:44 PM   Specimen: Nasal Mucosa; Nasal Swab  Result Value Ref Range Status   MRSA by PCR Next Gen NOT DETECTED NOT DETECTED Final    Comment: (NOTE) The GeneXpert MRSA Assay (FDA approved for NASAL specimens only), is one component of a comprehensive  MRSA colonization surveillance program. It is not intended to diagnose MRSA infection nor to guide or monitor treatment for MRSA infections. Test performance is not FDA approved in patients less than 35 years old. Performed at [facility], [address] [place], [place] [address]   Culture, blood (Routine X 2) w Reflex to ID Panel     Status: None (Preliminary result)   Collection Time: 11/11/23  8:08 AM   Specimen: BLOOD LEFT ARM  Result Value Ref Range Status   Specimen Description BLOOD LEFT ARM  Final  Special Requests   Final    BOTTLES DRAWN AEROBIC AND ANAEROBIC Blood Culture results may not be optimal due to an inadequate volume of blood received in culture bottles   Culture   Final    NO GROWTH 2 DAYS Performed at [facility], [address] [place], [place] [address]    Report Status PENDING  Incomplete  Culture, blood (Routine X 2) w Reflex to ID Panel     Status: Abnormal   Collection Time: 11/11/23  8:21 AM   Specimen: BLOOD LEFT HAND  Result Value Ref Range Status   Specimen Description BLOOD LEFT HAND  Final   Special Requests   Final    BOTTLES DRAWN AEROBIC AND ANAEROBIC Blood Culture results may not be optimal due to an inadequate volume of blood received in culture bottles   Culture  Setup Time   Final    GRAM POSITIVE COCCI IN BOTH AEROBIC AND ANAEROBIC BOTTLES CRITICAL VALUE NOTED.  VALUE IS CONSISTENT WITH PREVIOUSLY REPORTED AND CALLED VALUE.    Culture (A)  Final    GRANULICATELLA ADIACENS Standardized susceptibility testing for this organism is not available. Performed at [facility], [address], [place], [place] [address]    Report Status 11/13/2023 FINAL  Final    Labs: CBC: Recent Labs  Lab 11/08/23 1155 11/09/23 0227 11/11/23 0808 11/12/23 0906 11/13/23 0827  WBC 11.0* 13.0* 13.2* 10.8* 11.1*  HGB 13.8 13.1 13.0 13.1 12.7  HCT 42.9 40.8 41.9 41.3 40.4  MCV 91.9 92.7 94.2 93.0 93.1  PLT 284 297 362 433* 466*    Basic Metabolic Panel: Recent Labs  Lab 11/08/23 1155 11/08/23 1700 11/09/23 0227 11/11/23 0808 11/12/23 0906 11/13/23 0827  NA 132*  --  135 139 137 138  K 3.3*  --  3.7 3.8 3.3* 3.6  CL 93*  --  99 98 94* 94*  CO2 25  --  23 32 34* 35*  GLUCOSE 110*  --  83 123* 138* 164*  BUN 15  --  14 6* 6* 7*  CREATININE 0.68  --  0.79 0.48 0.45 0.35*  CALCIUM 8.6*  --  8.5* 9.1 8.1* 8.0*  MG  --  2.0  --   --   --   --    Liver Function Tests: Recent Labs  Lab 11/09/23 0227  AST 25  ALT 12  ALKPHOS 67  BILITOT 1.9*  PROT 6.0*  ALBUMIN 2.0*   CBG: No results for input(s): "GLUCAP" in the last 168 hours.  Discharge time spent: {LESS THAN/GREATER BJYN:82956} 30 minutes.  Signed: Aisha Hove, MD [facility] 11/13/2023

## 2023-11-13 NOTE — Progress Notes (Addendum)
 [facility]  Date of Admission:  11/08/2023      Total days of antibiotics 6  Ceftriaxone  2gm dose day 5           ASSESSMENT: [name] is a 83 y.o. female admitted with   Streptococcous Pneumoniae Bacteremia  2/2 Pneumonia -  Back to baseline on 4 LPM oxygen . Doing more for herself and ready to go home. She already had her dose of ceftriaxone  today. Will plan to finish out 10 days of linezolid through Saturday 6/14 given the PCN MIC is intermediate. Seems she is going home today or tomorrow based on discussion with [name] and her family at the bedside.  Will investigate if PA is needed. Alternatively could do 5d course of levaquin.   Medication Monitoring -  Lab Results  Component Value Date   PLT 466 (H) 11/13/2023  No concern for platelet counts and short course of linezolid use   Atrial Fibrillation -  Cards  has finalized a plan for discharge to community with rate control and NOAC.   Can FU with primary care in community at discharge.  ID will sign off - please call back with any questions/concerns or if we can be of further assistance.    PLAN: Start linezolid 6/10 to finish out 5 more days (inquiring about TOC fill and PA if needed with Morgan Arab in pharmacy) --> orders pended in D/C navigator    ADDENDUM:  Repeated blood cultures returned with granulicatella from left hand draw in both bottles. This was not present on admission and highly suspicious for contamination from venipuncture. Covered with current plan and no need for further work up.    Principal Problem:   Acute on chronic respiratory failure with hypoxia (HCC) Active Problems:   HLD (hyperlipidemia)   GERD   Obesity   HTN (hypertension)   Interstitial lung disease (HCC)   Bronchiectasis with (acute) exacerbation (HCC)   Rheumatoid arthritis (HCC)   Pulmonary fibrosis (HCC)   Pulmonary hypertension (HCC)   Diastolic CHF (HCC)   Chronic  respiratory failure with hypoxia (HCC)   Severe persistent asthma, uncomplicated   Bacteremia due to Streptococcus pneumoniae   Rhinovirus    diltiazem  120 mg Oral Daily   enoxaparin  (LOVENOX ) injection  1 mg/kg Subcutaneous Q12H   Nintedanib   1 capsule Oral BID   sodium chloride  flush  3 mL Intravenous Q12H    SUBJECTIVE: Feeling better. Ready to go home. Swallowing pills fine and back to normal levels of oxygen  at baseline.  Family at bedside today. Timeline of events a little confusing today.    Review of Systems: Review of Systems  Constitutional:  Negative for chills and fever.  HENT:  Negative for tinnitus.   Eyes:  Negative for blurred vision and photophobia.  Respiratory:  Negative for cough and sputum production.   Cardiovascular:  Negative for chest pain.  Gastrointestinal:  Negative for diarrhea, nausea and vomiting.  Genitourinary:  Negative for dysuria.  Skin:  Negative for rash.  Neurological:  Negative for headaches.    Allergies  Allergen Reactions   Adhesive [Tape] Itching and Rash   Latex Itching and Swelling   Doxycycline  Rash   Sulfonamide Derivatives Rash and Other (See Comments)    OBJECTIVE: Vitals:   11/12/23 1926 11/12/23 2234 11/13/23 0419 11/13/23 0726  BP: 126/66 133/63 139/66 134/70  Pulse: (!) 108 (!) 118 94 (!) 110  Resp: 20  14 19 18   Temp: 98.2 F (36.8 C) 97.8 F (36.6 C) 97.7 F (36.5 C) 97.9 F (36.6 C)  TempSrc: Oral Oral Oral Oral  SpO2: 93% 94% 98% 93%  Weight:      Height:       Body mass index is 27.73 kg/m.  Physical Exam Constitutional:      Appearance: She is well-developed. She is not ill-appearing.  Pulmonary:     Effort: Pulmonary effort is normal. No tachypnea or accessory muscle usage.     Breath sounds: Normal breath sounds.     Comments: Baseline [place] 4 lpm Skin:    General: Skin is warm and dry.     Capillary Refill: Capillary refill takes less than 2 seconds.  Neurological:     Comments: Slightly  confused about timeline of things today. Easily reoriented      Lab Results Lab Results  Component Value Date   WBC 11.1 (H) 11/13/2023   HGB 12.7 11/13/2023   HCT 40.4 11/13/2023   MCV 93.1 11/13/2023   PLT 466 (H) 11/13/2023    Lab Results  Component Value Date   CREATININE 0.35 (L) 11/13/2023   BUN 7 (L) 11/13/2023   NA 138 11/13/2023   K 3.6 11/13/2023   CL 94 (L) 11/13/2023   CO2 35 (H) 11/13/2023    Lab Results  Component Value Date   ALT 12 11/09/2023   AST 25 11/09/2023   ALKPHOS 67 11/09/2023   BILITOT 1.9 (H) 11/09/2023     Microbiology: Recent Results (from the past 240 hours)  Culture, blood (routine x 2)     Status: Abnormal   Collection Time: 11/08/23 12:35 PM   Specimen: BLOOD  Result Value Ref Range Status   Specimen Description BLOOD SITE NOT SPECIFIED  Final   Special Requests   Final    BOTTLES DRAWN AEROBIC AND ANAEROBIC Blood Culture results may not be optimal due to an inadequate volume of blood received in culture bottles   Culture  Setup Time   Final    GRAM POSITIVE COCCI IN BOTH AEROBIC AND ANAEROBIC BOTTLES CRITICAL RESULT CALLED TO, READ BACK BY AND VERIFIED WITH: J [facility]  11/09/23 MK Performed at [facility], [address] [place], [place] [address]    Culture STREPTOCOCCUS PNEUMONIAE (A)  Final   Report Status 11/12/2023 FINAL  Final   Organism ID, Bacteria STREPTOCOCCUS PNEUMONIAE  Final      Susceptibility   Streptococcus pneumoniae - MIC*    ERYTHROMYCIN >=8 RESISTANT Resistant     LEVOFLOXACIN 0.5 SENSITIVE Sensitive     VANCOMYCIN 0.5 SENSITIVE Sensitive     PENICILLIN (meningitis) 1 RESISTANT Resistant     PENO - penicillin 1      PENICILLIN (non-meningitis) 1 SENSITIVE Sensitive     PENICILLIN (oral) 1 INTERMEDIATE Intermediate     CEFTRIAXONE  (non-meningitis) 1 SENSITIVE Sensitive     CEFTRIAXONE  (meningitis) 1 INTERMEDIATE Intermediate     * STREPTOCOCCUS PNEUMONIAE  Blood Culture ID Panel  (Reflexed)     Status: Abnormal   Collection Time: 11/08/23 12:35 PM  Result Value Ref Range Status   Enterococcus faecalis NOT DETECTED NOT DETECTED Final   Enterococcus Faecium NOT DETECTED NOT DETECTED Final   Listeria monocytogenes NOT DETECTED NOT DETECTED Final   Staphylococcus species NOT DETECTED NOT DETECTED Final   Staphylococcus aureus (BCID) NOT DETECTED NOT DETECTED Final   Staphylococcus epidermidis NOT DETECTED NOT DETECTED Final   Staphylococcus lugdunensis NOT DETECTED NOT  DETECTED Final   Streptococcus species DETECTED (A) NOT DETECTED Final    Comment: CRITICAL RESULT CALLED TO, READ BACK BY AND VERIFIED WITH: J [facility]  11/09/23 MK    Streptococcus agalactiae NOT DETECTED NOT DETECTED Final   Streptococcus pneumoniae DETECTED (A) NOT DETECTED Final    Comment: CRITICAL RESULT CALLED TO, READ BACK BY AND VERIFIED WITH: J WYLAND,[email or web address]  11/09/23 MK    Streptococcus pyogenes NOT DETECTED NOT DETECTED Final   A.calcoaceticus-baumannii NOT DETECTED NOT DETECTED Final   Bacteroides fragilis NOT DETECTED NOT DETECTED Final   Enterobacterales NOT DETECTED NOT DETECTED Final   Enterobacter cloacae complex NOT DETECTED NOT DETECTED Final   Escherichia coli NOT DETECTED NOT DETECTED Final   Klebsiella aerogenes NOT DETECTED NOT DETECTED Final   Klebsiella oxytoca NOT DETECTED NOT DETECTED Final   Klebsiella pneumoniae NOT DETECTED NOT DETECTED Final   Proteus species NOT DETECTED NOT DETECTED Final   Salmonella species NOT DETECTED NOT DETECTED Final   Serratia marcescens NOT DETECTED NOT DETECTED Final   Haemophilus influenzae NOT DETECTED NOT DETECTED Final   Neisseria meningitidis NOT DETECTED NOT DETECTED Final   Pseudomonas aeruginosa NOT DETECTED NOT DETECTED Final   Stenotrophomonas maltophilia NOT DETECTED NOT DETECTED Final   Candida albicans NOT DETECTED NOT DETECTED Final   Candida auris NOT DETECTED NOT DETECTED Final   Candida glabrata NOT  DETECTED NOT DETECTED Final   Candida krusei NOT DETECTED NOT DETECTED Final   Candida parapsilosis NOT DETECTED NOT DETECTED Final   Candida tropicalis NOT DETECTED NOT DETECTED Final   Cryptococcus neoformans/gattii NOT DETECTED NOT DETECTED Final    Comment: Performed at [facility], [address] [place], [place] [address]  Respiratory (~20 pathogens) panel by PCR     Status: Abnormal   Collection Time: 11/08/23  2:16 PM   Specimen: Nasopharyngeal Swab; Respiratory  Result Value Ref Range Status   Adenovirus NOT DETECTED NOT DETECTED Final   Coronavirus 229E NOT DETECTED NOT DETECTED Final    Comment: (NOTE) The Coronavirus on the Respiratory Panel, DOES NOT test for the novel  Coronavirus (2019 nCoV)    Coronavirus HKU1 NOT DETECTED NOT DETECTED Final   Coronavirus NL63 NOT DETECTED NOT DETECTED Final   Coronavirus OC43 NOT DETECTED NOT DETECTED Final   Metapneumovirus NOT DETECTED NOT DETECTED Final   Rhinovirus / Enterovirus DETECTED (A) NOT DETECTED Final   Influenza A NOT DETECTED NOT DETECTED Final   Influenza B NOT DETECTED NOT DETECTED Final   Parainfluenza Virus 1 NOT DETECTED NOT DETECTED Final   Parainfluenza Virus 2 NOT DETECTED NOT DETECTED Final   Parainfluenza Virus 3 NOT DETECTED NOT DETECTED Final   Parainfluenza Virus 4 NOT DETECTED NOT DETECTED Final   Respiratory Syncytial Virus NOT DETECTED NOT DETECTED Final   Bordetella pertussis NOT DETECTED NOT DETECTED Final   Bordetella Parapertussis NOT DETECTED NOT DETECTED Final   Chlamydophila pneumoniae NOT DETECTED NOT DETECTED Final   Mycoplasma pneumoniae NOT DETECTED NOT DETECTED Final    Comment: Performed at [facility], [address] [place], [place] [address]  Culture, blood (routine x 2)     Status: Abnormal   Collection Time: 11/08/23  5:00 PM   Specimen: BLOOD  Result Value Ref Range Status   Specimen Description BLOOD LEFT ANTECUBITAL  Final   Special Requests   Final    BOTTLES  DRAWN AEROBIC ONLY Blood Culture results may not be optimal due to an inadequate volume of blood received in culture  bottles   Culture  Setup Time   Final    GRAM POSITIVE COCCI AEROBIC BOTTLE ONLY CRITICAL VALUE NOTED.  VALUE IS CONSISTENT WITH PREVIOUSLY REPORTED AND CALLED VALUE.    Culture (A)  Final    STREPTOCOCCUS PNEUMONIAE SUSCEPTIBILITIES PERFORMED ON PREVIOUS CULTURE WITHIN THE LAST 5 DAYS. Performed at [facility], [address] [place], [place] [address]    Report Status 11/12/2023 FINAL  Final  MRSA Next Gen by PCR, Nasal     Status: None   Collection Time: 11/08/23  5:44 PM   Specimen: Nasal Mucosa; Nasal Swab  Result Value Ref Range Status   MRSA by PCR Next Gen NOT DETECTED NOT DETECTED Final    Comment: (NOTE) The GeneXpert MRSA Assay (FDA approved for NASAL specimens only), is one component of a comprehensive MRSA colonization surveillance program. It is not intended to diagnose MRSA infection nor to guide or monitor treatment for MRSA infections. Test performance is not FDA approved in patients less than 3 years old. Performed at [facility], [address], [place], [place] [address]   Culture, blood (Routine X 2) w Reflex to ID Panel     Status: None (Preliminary result)   Collection Time: 11/11/23  8:08 AM   Specimen: BLOOD LEFT ARM  Result Value Ref Range Status   Specimen Description BLOOD LEFT ARM  Final   Special Requests   Final    BOTTLES DRAWN AEROBIC AND ANAEROBIC Blood Culture results may not be optimal due to an inadequate volume of blood received in culture bottles   Culture   Final    NO GROWTH 2 DAYS Performed at [facility], [address], [place], [place] [address]    Report Status PENDING  Incomplete  Culture, blood (Routine X 2) w Reflex to ID Panel     Status: None (Preliminary result)   Collection Time: 11/11/23  8:21 AM   Specimen: BLOOD LEFT HAND  Result Value Ref Range Status   Specimen Description BLOOD  LEFT HAND  Final   Special Requests   Final    BOTTLES DRAWN AEROBIC AND ANAEROBIC Blood Culture results may not be optimal due to an inadequate volume of blood received in culture bottles   Culture  Setup Time   Final    GRAM POSITIVE COCCI IN BOTH AEROBIC AND ANAEROBIC BOTTLES CRITICAL VALUE NOTED.  VALUE IS CONSISTENT WITH PREVIOUSLY REPORTED AND CALLED VALUE. Performed at [facility], [address], [place], [place] [address]    Culture GRAM POSITIVE COCCI  Final   Report Status PENDING  Incomplete    Gibson Kurtz, MSN, NP-C [facility]  [email or web address] Pager: [phone] Office: [phone] [facility] Main Line: [phone] *Secure Chat Communication Welcome

## 2023-11-13 NOTE — Discharge Instructions (Signed)

## 2023-11-13 NOTE — Telephone Encounter (Signed)
 Patient Product/process development scientist completed.    The patient is insured through [facility]. Patient has Medicare and is not eligible for a copay card, but may be able to apply for patient assistance or Medicare RX Payment Plan (Patient Must reach out to their plan, if eligible for payment plan), if available.    Ran test claim for Eliquis 5 mg and the current 30 day co-pay is $0.00.   This test claim was processed through [facility]- copay amounts may vary at other pharmacies due to pharmacy/plan contracts, or as the patient moves through the different stages of their insurance plan.     Roland Earl, CPHT Pharmacy Technician III Certified Patient Advocate [facility] Pharmacy Patient Advocate Team Direct Number: [phone]  Fax: [phone]

## 2023-11-13 NOTE — Telephone Encounter (Signed)
 Pharmacy Patient Advocate Encounter  Received notification from [facility] that Prior Authorization for Linezolid 600MG  tablets  has been APPROVED from 11/13/2023 to 02/13/2024. Ran test claim, Copay is $0.00. This test claim was processed through [facility]- copay amounts may vary at other pharmacies due to pharmacy/plan contracts, or as the patient moves through the different stages of their insurance plan.   PA #/Case ID/Reference #: [id number]

## 2023-11-13 NOTE — TOC Transition Note (Signed)
 Transition of Care [facility]) - Discharge Note   Patient Details  Name: [name] MRN: [id number] Date of Birth: 05-24-1941  [facility] CM/SW Contact:  Jeani Mill, RN Phone Number: 11/13/2023, 1:27 PM   Clinical Narrative:    Patient stable for discharge.  Notified Amy with [facility] of discharge.  Family will transport home.   Final next level of care: Home w Home Health Services Barriers to Discharge: Barriers Resolved   Patient Goals and CMS Choice Patient states their goals for this hospitalization and ongoing recovery are:: return home CMS Medicare.gov Compare Post Acute Care list provided to:: Patient Choice offered to / list presented to : Patient      Discharge Placement                 Home      Discharge Plan and Services Additional resources added to the After Visit Summary for     Discharge Planning Services: CM Consult                      HH Arranged: PT, OT [facility] Agency: [facility] Date [facility] Agency Contacted: 11/10/23 Time HH Agency Contacted: 1610 Representative spoke with at [facility] Agency: Amy  Social Drivers of Health (SDOH) Interventions SDOH Screenings   Food Insecurity: No Food Insecurity (11/10/2023)  Housing: Low Risk  (11/10/2023)  Transportation Needs: No Transportation Needs (11/08/2023)  Utilities: Not At Risk (11/10/2023)  Depression (PHQ2-9): Low Risk  (01/14/2020)  Social Connections: Moderately Integrated (11/08/2023)  Tobacco Use: Low Risk  (11/08/2023)     Readmission Risk Interventions    11/13/2023    1:26 PM  Readmission Risk Prevention Plan  Post Dischage Appt Complete  Medication Screening Complete  Transportation Screening Complete

## 2023-11-13 NOTE — Telephone Encounter (Signed)
 Pharmacy Patient Advocate Encounter   Received notification from Inpatient Request that prior authorization for Linezolid 600MG  tablets is required/requested.   Insurance verification completed.   The patient is insured through [facility] .   Per test claim: PA required; PA submitted to above mentioned insurance via CoverMyMeds Key/confirmation #/EOC [id number] Status is pending

## 2023-11-13 NOTE — Telephone Encounter (Signed)
 Patient Product/process development scientist completed.    The patient is insured through [facility]. Patient has Medicare and is not eligible for a copay card, but may be able to apply for patient assistance or Medicare RX Payment Plan (Patient Must reach out to their plan, if eligible for payment plan), if available.    Ran test claim for linezolid 600 mg and Requires Prior Authorziation Required   This test claim was processed through [facility]- copay amounts may vary at other pharmacies due to [employer], or as the patient moves through the different stages of their insurance plan.     Morgan Arab, CPHT Pharmacy Technician III Certified Patient Advocate [facility] Pharmacy Patient Advocate Team Direct Number: [phone]  Fax: [phone]

## 2023-11-13 NOTE — Progress Notes (Signed)
   Rounding Note    Patient Name: [name] Date of Encounter: 11/13/2023  [facility] Cardiologist: Mitzie Anda  Subjective   Feeling better, no complaints.  Breathing is about the same  Vital Signs    Vitals:   11/12/23 1926 11/12/23 2234 11/13/23 0419 11/13/23 0726  BP: 126/66 133/63 139/66 134/70  Pulse: (!) 108 (!) 118 94 (!) 110  Resp: 20 14 19 18   Temp: 98.2 F (36.8 C) 97.8 F (36.6 C) 97.7 F (36.5 C) 97.9 F (36.6 C)  TempSrc: Oral Oral Oral Oral  SpO2: 93% 94% 98% 93%  Weight:      Height:        Intake/Output Summary (Last 24 hours) at 11/13/2023 0750 Last data filed at 11/12/2023 2251 Gross per 24 hour  Intake 1060 ml  Output 800 ml  Net 260 ml      11/08/2023    5:35 PM 11/08/2023   10:42 AM 08/29/2023   11:06 AM  Last 3 Weights  Weight (lbs) 166 lb 10.7 oz 176 lb 176 lb 9.6 oz  Weight (kg) 75.6 kg 79.833 kg 80.105 kg      Telemetry    Rates in the 100-120 range.  Personally Reviewed  ECG    Personally Reviewed  Physical Exam   GEN: No acute distress.  Elderly. Chronically ill appearing. Cardiac: irregularly irregular Respiratory: Coarse crackles  Psych: Normal affect   Assessment & Plan    #AF w/ RVR D/C dilt 30 TID and start cardizem CD 120mg  Qday Start NOAC at discharge Not a candidate for invasive EP procedures Given pulmonary disease, not a candidate for amiodarone TTE scheduled as outpatient later this month, good pulse pressure and no evidence of HF  #PNA Per primary team

## 2023-11-14 DIAGNOSIS — J471 Bronchiectasis with (acute) exacerbation: Secondary | ICD-10-CM | POA: Diagnosis not present

## 2023-11-14 DIAGNOSIS — I4891 Unspecified atrial fibrillation: Secondary | ICD-10-CM | POA: Diagnosis not present

## 2023-11-14 DIAGNOSIS — Z7901 Long term (current) use of anticoagulants: Secondary | ICD-10-CM | POA: Diagnosis not present

## 2023-11-14 DIAGNOSIS — I5032 Chronic diastolic (congestive) heart failure: Secondary | ICD-10-CM | POA: Diagnosis not present

## 2023-11-14 DIAGNOSIS — E876 Hypokalemia: Secondary | ICD-10-CM | POA: Diagnosis not present

## 2023-11-14 DIAGNOSIS — J9611 Chronic respiratory failure with hypoxia: Secondary | ICD-10-CM | POA: Diagnosis not present

## 2023-11-14 DIAGNOSIS — J849 Interstitial pulmonary disease, unspecified: Secondary | ICD-10-CM | POA: Diagnosis not present

## 2023-11-14 DIAGNOSIS — M069 Rheumatoid arthritis, unspecified: Secondary | ICD-10-CM | POA: Diagnosis not present

## 2023-11-14 DIAGNOSIS — Z556 Problems related to health literacy: Secondary | ICD-10-CM | POA: Diagnosis not present

## 2023-11-14 DIAGNOSIS — Z96619 Presence of unspecified artificial shoulder joint: Secondary | ICD-10-CM | POA: Diagnosis not present

## 2023-11-14 DIAGNOSIS — R7881 Bacteremia: Secondary | ICD-10-CM | POA: Diagnosis not present

## 2023-11-14 DIAGNOSIS — Z9981 Dependence on supplemental oxygen: Secondary | ICD-10-CM | POA: Diagnosis not present

## 2023-11-14 DIAGNOSIS — J13 Pneumonia due to Streptococcus pneumoniae: Secondary | ICD-10-CM | POA: Diagnosis not present

## 2023-11-14 DIAGNOSIS — Z96652 Presence of left artificial knee joint: Secondary | ICD-10-CM | POA: Diagnosis not present

## 2023-11-14 DIAGNOSIS — K219 Gastro-esophageal reflux disease without esophagitis: Secondary | ICD-10-CM | POA: Diagnosis not present

## 2023-11-14 DIAGNOSIS — I110 Hypertensive heart disease with heart failure: Secondary | ICD-10-CM | POA: Diagnosis not present

## 2023-11-14 DIAGNOSIS — J4550 Severe persistent asthma, uncomplicated: Secondary | ICD-10-CM | POA: Diagnosis not present

## 2023-11-14 DIAGNOSIS — E785 Hyperlipidemia, unspecified: Secondary | ICD-10-CM | POA: Diagnosis not present

## 2023-11-14 DIAGNOSIS — I272 Pulmonary hypertension, unspecified: Secondary | ICD-10-CM | POA: Diagnosis not present

## 2023-11-15 ENCOUNTER — Other Ambulatory Visit (HOSPITAL_BASED_OUTPATIENT_CLINIC_OR_DEPARTMENT_OTHER)

## 2023-11-16 LAB — CULTURE, BLOOD (ROUTINE X 2): Culture: NO GROWTH

## 2023-11-17 ENCOUNTER — Encounter (HOSPITAL_BASED_OUTPATIENT_CLINIC_OR_DEPARTMENT_OTHER): Payer: Self-pay

## 2023-11-17 DIAGNOSIS — R7881 Bacteremia: Secondary | ICD-10-CM | POA: Diagnosis not present

## 2023-11-17 DIAGNOSIS — I4891 Unspecified atrial fibrillation: Secondary | ICD-10-CM | POA: Diagnosis not present

## 2023-11-17 DIAGNOSIS — J13 Pneumonia due to Streptococcus pneumoniae: Secondary | ICD-10-CM | POA: Diagnosis not present

## 2023-11-17 DIAGNOSIS — J471 Bronchiectasis with (acute) exacerbation: Secondary | ICD-10-CM | POA: Diagnosis not present

## 2023-11-17 DIAGNOSIS — J849 Interstitial pulmonary disease, unspecified: Secondary | ICD-10-CM | POA: Diagnosis not present

## 2023-11-17 DIAGNOSIS — J9611 Chronic respiratory failure with hypoxia: Secondary | ICD-10-CM | POA: Diagnosis not present

## 2023-11-18 ENCOUNTER — Encounter: Payer: Self-pay | Admitting: Internal Medicine

## 2023-11-20 DIAGNOSIS — I4891 Unspecified atrial fibrillation: Secondary | ICD-10-CM | POA: Diagnosis not present

## 2023-11-20 DIAGNOSIS — J9611 Chronic respiratory failure with hypoxia: Secondary | ICD-10-CM | POA: Diagnosis not present

## 2023-11-20 DIAGNOSIS — J13 Pneumonia due to Streptococcus pneumoniae: Secondary | ICD-10-CM | POA: Diagnosis not present

## 2023-11-20 DIAGNOSIS — R7881 Bacteremia: Secondary | ICD-10-CM | POA: Diagnosis not present

## 2023-11-20 DIAGNOSIS — J471 Bronchiectasis with (acute) exacerbation: Secondary | ICD-10-CM | POA: Diagnosis not present

## 2023-11-20 DIAGNOSIS — J849 Interstitial pulmonary disease, unspecified: Secondary | ICD-10-CM | POA: Diagnosis not present

## 2023-11-20 NOTE — Telephone Encounter (Signed)
 The Eliquis  was started in the hospital for atrial fibrillation by cardiology.  If she is having blood in the urine she will need to call primary care [it could be UTI it could be bladder problems] and also cardiology who is responsible for the Eliquis 

## 2023-11-21 DIAGNOSIS — J849 Interstitial pulmonary disease, unspecified: Secondary | ICD-10-CM | POA: Diagnosis not present

## 2023-11-21 DIAGNOSIS — R7881 Bacteremia: Secondary | ICD-10-CM | POA: Diagnosis not present

## 2023-11-21 DIAGNOSIS — I4891 Unspecified atrial fibrillation: Secondary | ICD-10-CM | POA: Diagnosis not present

## 2023-11-21 DIAGNOSIS — R531 Weakness: Secondary | ICD-10-CM | POA: Diagnosis not present

## 2023-11-21 DIAGNOSIS — R319 Hematuria, unspecified: Secondary | ICD-10-CM | POA: Diagnosis not present

## 2023-11-21 DIAGNOSIS — J13 Pneumonia due to Streptococcus pneumoniae: Secondary | ICD-10-CM | POA: Diagnosis not present

## 2023-11-21 DIAGNOSIS — J9611 Chronic respiratory failure with hypoxia: Secondary | ICD-10-CM | POA: Diagnosis not present

## 2023-11-21 DIAGNOSIS — Z09 Encounter for follow-up examination after completed treatment for conditions other than malignant neoplasm: Secondary | ICD-10-CM | POA: Diagnosis not present

## 2023-11-21 DIAGNOSIS — R899 Unspecified abnormal finding in specimens from other organs, systems and tissues: Secondary | ICD-10-CM | POA: Diagnosis not present

## 2023-11-21 DIAGNOSIS — J471 Bronchiectasis with (acute) exacerbation: Secondary | ICD-10-CM | POA: Diagnosis not present

## 2023-11-21 DIAGNOSIS — R2689 Other abnormalities of gait and mobility: Secondary | ICD-10-CM | POA: Diagnosis not present

## 2023-11-21 DIAGNOSIS — Z6825 Body mass index (BMI) 25.0-25.9, adult: Secondary | ICD-10-CM | POA: Diagnosis not present

## 2023-11-21 DIAGNOSIS — J189 Pneumonia, unspecified organism: Secondary | ICD-10-CM | POA: Diagnosis not present

## 2023-11-21 DIAGNOSIS — J9621 Acute and chronic respiratory failure with hypoxia: Secondary | ICD-10-CM | POA: Diagnosis not present

## 2023-11-22 ENCOUNTER — Ambulatory Visit (HOSPITAL_BASED_OUTPATIENT_CLINIC_OR_DEPARTMENT_OTHER)

## 2023-11-22 DIAGNOSIS — J9611 Chronic respiratory failure with hypoxia: Secondary | ICD-10-CM

## 2023-11-22 DIAGNOSIS — J849 Interstitial pulmonary disease, unspecified: Secondary | ICD-10-CM | POA: Diagnosis not present

## 2023-11-22 DIAGNOSIS — Z5181 Encounter for therapeutic drug level monitoring: Secondary | ICD-10-CM

## 2023-11-22 DIAGNOSIS — R0602 Shortness of breath: Secondary | ICD-10-CM | POA: Diagnosis not present

## 2023-11-22 DIAGNOSIS — J679 Hypersensitivity pneumonitis due to unspecified organic dust: Secondary | ICD-10-CM

## 2023-11-22 LAB — ECHOCARDIOGRAM COMPLETE
AV Vena cont: 0.42 cm
Area-P 1/2: 2.95 cm2
MV M vel: 4.84 m/s
MV Peak grad: 93.7 mmHg
S' Lateral: 2.02 cm

## 2023-11-23 DIAGNOSIS — R7881 Bacteremia: Secondary | ICD-10-CM | POA: Diagnosis not present

## 2023-11-23 DIAGNOSIS — J9611 Chronic respiratory failure with hypoxia: Secondary | ICD-10-CM | POA: Diagnosis not present

## 2023-11-23 DIAGNOSIS — I4891 Unspecified atrial fibrillation: Secondary | ICD-10-CM | POA: Diagnosis not present

## 2023-11-23 DIAGNOSIS — J13 Pneumonia due to Streptococcus pneumoniae: Secondary | ICD-10-CM | POA: Diagnosis not present

## 2023-11-23 DIAGNOSIS — J471 Bronchiectasis with (acute) exacerbation: Secondary | ICD-10-CM | POA: Diagnosis not present

## 2023-11-23 DIAGNOSIS — J849 Interstitial pulmonary disease, unspecified: Secondary | ICD-10-CM | POA: Diagnosis not present

## 2023-11-24 DIAGNOSIS — J849 Interstitial pulmonary disease, unspecified: Secondary | ICD-10-CM | POA: Diagnosis not present

## 2023-11-24 DIAGNOSIS — J13 Pneumonia due to Streptococcus pneumoniae: Secondary | ICD-10-CM | POA: Diagnosis not present

## 2023-11-24 DIAGNOSIS — J9611 Chronic respiratory failure with hypoxia: Secondary | ICD-10-CM | POA: Diagnosis not present

## 2023-11-24 DIAGNOSIS — J471 Bronchiectasis with (acute) exacerbation: Secondary | ICD-10-CM | POA: Diagnosis not present

## 2023-11-24 DIAGNOSIS — R7881 Bacteremia: Secondary | ICD-10-CM | POA: Diagnosis not present

## 2023-11-24 DIAGNOSIS — I4891 Unspecified atrial fibrillation: Secondary | ICD-10-CM | POA: Diagnosis not present

## 2023-11-25 ENCOUNTER — Other Ambulatory Visit: Payer: Self-pay | Admitting: Internal Medicine

## 2023-11-25 DIAGNOSIS — J849 Interstitial pulmonary disease, unspecified: Secondary | ICD-10-CM

## 2023-11-25 DIAGNOSIS — J679 Hypersensitivity pneumonitis due to unspecified organic dust: Secondary | ICD-10-CM

## 2023-11-27 DIAGNOSIS — J849 Interstitial pulmonary disease, unspecified: Secondary | ICD-10-CM | POA: Diagnosis not present

## 2023-11-27 DIAGNOSIS — I4891 Unspecified atrial fibrillation: Secondary | ICD-10-CM | POA: Diagnosis not present

## 2023-11-27 DIAGNOSIS — J13 Pneumonia due to Streptococcus pneumoniae: Secondary | ICD-10-CM | POA: Diagnosis not present

## 2023-11-27 DIAGNOSIS — R7881 Bacteremia: Secondary | ICD-10-CM | POA: Diagnosis not present

## 2023-11-27 DIAGNOSIS — J9611 Chronic respiratory failure with hypoxia: Secondary | ICD-10-CM | POA: Diagnosis not present

## 2023-11-27 DIAGNOSIS — J471 Bronchiectasis with (acute) exacerbation: Secondary | ICD-10-CM | POA: Diagnosis not present

## 2023-11-27 NOTE — Telephone Encounter (Signed)
 Refill sent for OFEV  to [facility]: [phone]  Dose: 150mg  twice daily  Last OV: 11/08/23 (acute visit with Dr. Isadora) Provider: Dr. Geronimo Pertinent labs: LFTs on 11/09/23  Next OV: 12/05/2023  Sherry Pennant, PharmD, MPH, BCPS Clinical Pharmacist (Rheumatology and Pulmonology)

## 2023-11-29 ENCOUNTER — Other Ambulatory Visit: Payer: Self-pay | Admitting: Pharmacist

## 2023-12-01 DIAGNOSIS — J9611 Chronic respiratory failure with hypoxia: Secondary | ICD-10-CM | POA: Diagnosis not present

## 2023-12-01 DIAGNOSIS — J13 Pneumonia due to Streptococcus pneumoniae: Secondary | ICD-10-CM | POA: Diagnosis not present

## 2023-12-01 DIAGNOSIS — J471 Bronchiectasis with (acute) exacerbation: Secondary | ICD-10-CM | POA: Diagnosis not present

## 2023-12-01 DIAGNOSIS — I4891 Unspecified atrial fibrillation: Secondary | ICD-10-CM | POA: Diagnosis not present

## 2023-12-01 DIAGNOSIS — J849 Interstitial pulmonary disease, unspecified: Secondary | ICD-10-CM | POA: Diagnosis not present

## 2023-12-01 DIAGNOSIS — R7881 Bacteremia: Secondary | ICD-10-CM | POA: Diagnosis not present

## 2023-12-04 DIAGNOSIS — I4891 Unspecified atrial fibrillation: Secondary | ICD-10-CM | POA: Diagnosis not present

## 2023-12-04 DIAGNOSIS — R7881 Bacteremia: Secondary | ICD-10-CM | POA: Diagnosis not present

## 2023-12-04 DIAGNOSIS — J471 Bronchiectasis with (acute) exacerbation: Secondary | ICD-10-CM | POA: Diagnosis not present

## 2023-12-04 DIAGNOSIS — J13 Pneumonia due to Streptococcus pneumoniae: Secondary | ICD-10-CM | POA: Diagnosis not present

## 2023-12-04 DIAGNOSIS — J849 Interstitial pulmonary disease, unspecified: Secondary | ICD-10-CM | POA: Diagnosis not present

## 2023-12-04 DIAGNOSIS — J9611 Chronic respiratory failure with hypoxia: Secondary | ICD-10-CM | POA: Diagnosis not present

## 2023-12-05 ENCOUNTER — Encounter: Payer: Self-pay | Admitting: Internal Medicine

## 2023-12-05 ENCOUNTER — Ambulatory Visit (INDEPENDENT_AMBULATORY_CARE_PROVIDER_SITE_OTHER): Admitting: Internal Medicine

## 2023-12-05 VITALS — BP 134/64 | HR 83 | Ht 65.0 in | Wt 165.0 lb

## 2023-12-05 DIAGNOSIS — J9611 Chronic respiratory failure with hypoxia: Secondary | ICD-10-CM

## 2023-12-05 DIAGNOSIS — Z09 Encounter for follow-up examination after completed treatment for conditions other than malignant neoplasm: Secondary | ICD-10-CM | POA: Diagnosis not present

## 2023-12-05 DIAGNOSIS — Z8701 Personal history of pneumonia (recurrent): Secondary | ICD-10-CM

## 2023-12-05 DIAGNOSIS — J849 Interstitial pulmonary disease, unspecified: Secondary | ICD-10-CM | POA: Diagnosis not present

## 2023-12-05 DIAGNOSIS — J679 Hypersensitivity pneumonitis due to unspecified organic dust: Secondary | ICD-10-CM | POA: Diagnosis not present

## 2023-12-05 NOTE — Progress Notes (Signed)
 xxxxxxxxxxxxxxxxxxxxx  OV 07/29/2019 -transfer of care to Dr. Geronimo interstitial lung disease center.  Subjective:  Patient ID: [name], female , DOB: 01-Jan-1941 , age 83 y.o. , MRN: [id number] , ADDRESS: [address] [place] [address]   07/29/2019 -   Chief Complaint  Patient presents with   Follow-up    Pt states her breathing has become worse since last visit. States she also has an occ cough.   #Interstitial lung disease (progressive) with chronic hypoxemic respiratory failure - chronic HP dx -Per [facility] September 2016: Disease present at least since 2006/12/22  - Surgical lung biopsy  November 20, 2014 at [place]:   - local opinion: diagnostic of usual interstitial pneumonia (UIP). Interestingly, there are scattered non-necrotizing granulomata in the pleura.  -Second opinion September 2016 at [facility]: pathology was reviewed at our multidisciplinary ILD conference and noted to have a variegated pattern of fibrosis with some airway centricity, granulomas noted both near the airway and more peripherally. Minimal inflammation was noted. Features not c/w with UIP. Few giant cells with cholesterol clefts. Overall pattern most in keeping with chronic HP  -High-resolution CT chest including April 09, 2018: Lack of craniocaudal gradient and significant air trapping suggestive of chronic hypersensitivity pneumonitis [September 2016 [facility] evaluation thought CT was inconsistent with UIP]  -Clinical diagnosis of chronic hypersensitivity pneumonitis   - dx [facility]  -No clear-cut exposure identified at [facility] evaluation September 2016 by Dr. Hollis  -Steroids recommended but patient preferred not to take it September 2016 at [facility]  -Treatment   - deferred steroids sept 12/22/2014 visit at [facility] with Dr Carleton  - nintedanib  since March 2020   #Small hiatal hernia with acid reflux disease  HPI [name] 83 y.o. -is  a transfer of care from Dr. Alaine to Dr. Geronimo with interstitial lung disease questionnaire.  She was last seen by Dr. McQuaid in March 2020.  After the onset of the COVID-19 pandemic Dr. McQuaid is now fully in charge of the Covid hospital [facility].  Therefore he is not in the outpatient medical practice anymore.  Therefore patient has been transferred to the ILD center to Dr. Geronimo myself.  Is the first time I am meeting her and getting to know her.  She tells me that I took care of her husband for COPD a few years ago.  This was her ex-husband.  He passed away in Dec 22, 2011 after hospitalization to the ICU under our service.  She is grateful for the care.  She tells me that she has interstitial lung disease and for the last year she has been on nintedanib .  Currently she is having difficulty with nintedanib  supply.  She only has 2 weeks left.  She uses 3 L of oxygen  at rest.  She says she has been doing that for the last 4 years.  It is stable usage.  I am not really sure what her pulse ox on room air would be.  She says occasionally she would crank it up to 4 L for exertion but she generally uses 3 L both at rest and with exertion.  She says she tolerates nintedanib  fine. Her interstitial lung disease history was reviewed and summarized above.  It appears that she was not keen on taking prednisone  and her 2014-12-22 visit with Dr. Hollis at [facility].  Therefore once the IN[facility] data was available she was started  on nintedanib  a year ago.She wants to have a son involved in her visits.  Because of the COVID-19 pandemic he is waiting outside in the car.  She says at the next visit she wants to bring him in.   Her symptom score is listed below.  Her progressive lung function decline is also listed below and the pulmonary function test reviewed.  I personally reviewed and visualized and interpreted those.  Her DLCO is paradoxically higher.  I think this is an error.   ROS - per  HPI  OV 10/03/2019  Subjective:  Patient ID: [name], female , DOB: [birth year] , age 83 y.o. 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 Interestingly, there are scattered non-necrotizing granulomata in the pleura.  -Second opinion September 2016 at [facility]: pathology was reviewed at our multidisciplinary ILD conference and noted to have a variegated pattern of fibrosis with some airway centricity, granulomas noted both near the airway and more peripherally. Minimal inflammation was noted. Features not c/w with UIP. Few giant cells with cholesterol clefts. Overall pattern most in keeping with chronic HP  -High-resolution CT chest including April 09, 2018: Lack of craniocaudal gradient and significant air trapping suggestive of chronic hypersensitivity pneumonitis [September 2016 [facility] evaluation thought CT was inconsistent with UIP]  -Clinical diagnosis of chronic hypersensitivity pneumonitis   - dx [facility] at [facility]  -No clear-cut exposure identified at [facility] evaluation September 2016 by Dr. Hollis  -Steroids recommended but patient preferred not to take it September 2016 at [facility]  -Treatment   - deferred steroids sept 2016 visit at [facility] with Dr Carleton  - nintedanib  since March 2020   #Small hiatal hernia with acid reflux disease  10/03/2019 -   Chief Complaint  Patient presents with   Follow-up    SOB and cough with activity has increasd, Productive cough with yellow sputum     HPI [name] 83 y.o. -returns for follow-up with her son.  At this point in time the focus is to establish the true nature of interstitial  lung disease.  She has brought her son with her.  However approximately 2 weeks ago she had acute bronchitis symptoms and called in.  Was given a cephalexin  5 days and prednisone  5 days.  These have helped immensely.  However she feels not back to baseline.  She feels she will benefit from another round of antibiotic with or without prednisone .  She finished ILD questionnaire and it is listed below.  We have not been able to discuss in a multidisciplinary case conference of 4. [facility] Integrated Comprehensive ILD Questionnaire  Symptoms: See below.   Past Medical History :  -Denies any collagen vascular disease or vasculitis.  Denies any knowledge of prior pulmonary hypertension.  Denies diabetes or thyroid  disease.  Denies any stroke.  Denies mononucleosis denies tuberculosis denies pneumonia denies blood clots denies heart disease denies pleurisy   ROS: Positive for shoulder pain for the last several years but otherwise no dry eyes.  No Raynaud's.  No weight loss.  No nausea no vomiting no rash   FAMILY HISTORY of LUNG DISEASE: Denies any pulmonary fibrosis or COPD or cystic  fibrosis of hypersensitive pneumonitis or any other lung disease.   EXPOSURE HISTORY: Never smoked cigarettes.  No passive smoking.  No marijuana.  No vaping no cocaine no intravenous drug use.   HOME and HOBBY DETAILS : Current home is 25 years she is lived there for 5 years.  In [place] setting there is no dampness.  No mold or mildew no feather pillow.  No steam iron use no Jacuzzi use no nebulizer use no humidifier use.  No pet birds or parakeets.  No pet gerbils no pet hamsters no pet rabbits no rodents.  No mold in the [facility] duct no music habits such as wind instruments no gardening.  No flood of water  damage.  No strong mats.  No exposure to animals at work.  History update in April 2023: She reported that between 2010 2014 she lived in the mother's house that was built in the 1950s.  She thought there was mold in  this but never confirmed.  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  She also had a long-term tendency to clean her home heavily with Clorox.  She inhaled these fumes according to the son.  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   PULMONARY TOXICITY HISTORY (27 items): She has done prednisone  burst.  She does not want to do long-term prednisone .  She has been on long-term nintedanib  but tolerating it well.    Testing -2018 echo with elevated pulmonary systolic pressure 55 mmHg  - nov 2019 Last CT - HRCT IMPRESSION: 1. Spectrum of findings compatible with fibrotic interstitial lung disease with extensive air trapping and absence of basilar gradient, strongly favoring chronic hypersensitivity pneumonitis. Findings have slightly progressed since 04/24/2017, with more clear progression since more remote chest CT of 06/24/2014. Findings are suggestive of an alternative diagnosis (not UIP) per consensus guidelines: Diagnosis of Idiopathic Pulmonary Fibrosis: An Official ATS/ERS/JRS/ALAT Clinical Practice Guideline. Am JINNY Honey Crit Care Med Vol 198, Iss 5, [phone], Feb 04 2017. 2. Stable mild mediastinal lymphadenopathy, most compatible with benign reactive adenopathy. 3. One vessel coronary atherosclerosis. 4. Small hiatal hernia.   Aortic Atherosclerosis (ICD10-I70.0).     Results for Teasdale, Lamiah G (MRN [id number]) as of 07/29/2019 11:43  Ref. Range 07/04/2014 12:58 09/03/2015 11:04 09/27/2016 13:56 05/01/2017 14:05 07/19/2019 15:54  FVC-Pre Latest Units: L 2.13 1.93 1.86 1.71 1.61  FVC-%Pred-Pre Latest Units: % 66 61 59 55 53   Results for BANEEN, WIESELER (MRN [id number]) as of 07/29/2019 11:43  Ref.  Range 07/04/2014 12:58 09/03/2015 11:04 09/27/2016 13:56 05/01/2017 14:05 07/19/2019 15:54  DLCO unc Latest Units: ml/min/mmHg 13.70 14.70 12.69 11.23 17.11  DLCO unc % pred Latest Units: % 48 51 44 39 82     ROS  OV 12/03/2019  Subjective:  Patient ID: [name], female , DOB: Feb 02, 1941 , age 88 y.o. 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 Interestingly, there are scattered non-necrotizing granulomata in the pleura.  -Second opinion September 2016 at [facility]: pathology was reviewed at our multidisciplinary ILD conference and noted to have a variegated pattern of fibrosis with some airway centricity, granulomas noted both near the airway and more peripherally. Minimal inflammation was noted. Features not c/w with UIP. Few giant cells with cholesterol clefts. Overall pattern most in keeping with chronic HP   - 3rd opinon: June 2021 [facility] [place] - HP   -High-resolution CT chest including April 09, 2018: Lack of craniocaudal gradient and significant air trapping suggestive of chronic hypersensitivity pneumonitis [September 2016 [facility] evaluation thought CT was inconsistent with UIP]  -Clinical diagnosis of chronic hypersensitivity pneumonitis   - dx [facility]  -No clear-cut exposure identified at [facility] evaluation September 2016 by Dr. Hollis  -Steroids recommended but patient preferred not to take it September 2016 at [facility]  -Treatment   - deferred steroids  sept 2016 visit at [facility] with Dr Carleton  - nintedanib  since March 2020   #Small hiatal hernia with acid reflux disease   HPI [name] 83 y.o. -presents with her son for follow-up.  She continues to be on 3 L at rest 4 L with exertion.  Her acute bronchitis from April 2021 is resolved.  Her overall symptoms are stable/slightly better after her acute bronchitis.  There is no interim complaints.  In the interim we did discuss her at her case conference.  The consensus diagnosis is that she has hypersensitive pneumonitis.  The only exposure is when she was working in the [employer] at in 1960s.  She and her son again denies any ongoing exposures.  Reviewed her pulmonary function test and it is stable compared to the recent ones.  There is progression over the last few years to several years.  In terms of a high-resolution CT chest is also stable since 2019 through 2021 but progressive when compared to earlier time points.  She was started on nintedanib  in March 2020.  She is tolerating this quite well.  It appears that she and she went on nintedanib  she is stable.  She yet to do pulmonary rehabilitation.  Last done few years ago.  Son feels that she needs to be more mobile She is yet to connect with the support group She did not get referred to cardiology for right heart catheterization. She wants a flutter valve for her associated bronchiectasis seen on CT scan    OV 03/09/2020   Subjective:  Patient ID: [name], female , DOB: [birth year], age 81 y.o. years. , MRN: [id number],  ADDRESS: [address] [place] [address] PCP  Okey Carlin Redbird, MD Providers : Treatment Team:  Attending Provider: Geronimo Amel, MD   Chief Complaint  Patient presents with   Follow-up    Patient wears 4 liters oxygen  with exertion, was prescribed celebrex they want to know if it is ok with OFEV .      #Interstitial lung disease (progressive) with chronic hypoxemic respiratory  failure - chronic HP dx -Per [facility] September 2016: Disease present at least since 2008  - Surgical lung biopsy  November 20, 2014 at [place]:   - local opinion: diagnostic of usual interstitial pneumonia (UIP). Interestingly,  there are scattered non-necrotizing granulomata in the pleura.  -Second opinion September 2016 at [facility]: pathology was reviewed at our multidisciplinary ILD conference and noted to have a variegated pattern of fibrosis with some airway centricity, granulomas noted both near the airway and more peripherally. Minimal inflammation was noted. Features not c/w with UIP. Few giant cells with cholesterol clefts. Overall pattern most in keeping with chronic HP   - 3rd opinon: June 2021 [facility] [place] - Hypersensitivity Pneumonitis   -High-resolution CT chest including April 09, 2018: Lack of craniocaudal gradient and significant air trapping suggestive of chronic hypersensitivity pneumonitis [September 2016 [facility] evaluation thought CT was inconsistent with UIP]  -Clinical diagnosis of chronic hypersensitivity pneumonitis   - dx [facility]  -No clear-cut exposure identified at [facility] evaluation September 2016 by Dr. Hollis  -Steroids recommended but patient preferred not to take it September 2016 at [facility]  -Treatment   - deferred steroids sept 2016 visit at [facility] with Dr Hollis and again at [facility] in June 2021  - nintedanib  sole Rx since March 2020     HPI [name] 83 y.o. -returns for follow-up with her son [name].  Since her last visit in June 2020 when she continues to be stable.  She is enrolled in the pulmonary rehabilitation program which has helped her fatigue and shortness of breath significantly.  She continues on nintedanib  without any adverse side effects.  She wanted to know if it is okay to take cephalexin  for arthralgia.  She has chronic osteoarthritis.  She wants to avoid NSAIDs.  In terms of oxygen   use she is stable at 4 L nasal cannula.  She recently had pulmonary function test I reviewed this and it shows stability and is documented below.  Her weight continues to be stable.  We referred her for right heart catheterization with because of the Covid delta surge she deferred it.  Of note on February 28, 2020 while trying to get into the car out of her primary care physician's office she fell down and sustained abrasions and bruises injuries particularly in her lower extremity.  Since then her right kneecap patella area is red and warm.  There is a small incision injury there but this is healed.  She thinks it slightly better/stable.  She is not having any fever.  This has not been treated.      ROS - per HPI  CT chest high resolution - may 2021  IMPRESSION: 1. Spectrum of findings compatible with fibrotic interstitial lung disease with moderate air trapping and absence of apicobasilar gradient. Findings are most compatible with chronic hypersensitivity pneumonitis. No interval progression since 2019 chest CT. Evidence of progression since baseline 2016 chest CT. Findings are suggestive of an alternative diagnosis (not UIP) per consensus guidelines: Diagnosis of Idiopathic Pulmonary Fibrosis: An Official ATS/ERS/JRS/ALAT Clinical Practice Guideline. Am JINNY Honey Crit Care Med Vol 198, Iss 5, [phone], Feb 04 2017. 2. Stable mild mediastinal lymphadenopathy, compatible with benign reactive adenopathy. 3. Small hiatal hernia. 4. Aortic Atherosclerosis (ICD10-I70.0).     Electronically Signed   By: Selinda DELENA Blue M.D.   On: 10/24/2019 15:02    OV 09/24/2020  Subjective:  Patient ID: [name], female , DOB: [birth year] , age 29 y.o. , MRN: [id number] , ADDRESS: [address] [place] [address] PCP Okey Carlin Redbird, MD Patient Care Team: Okey Carlin Redbird, MD as PCP - General (Family Medicine) Morgan Agent, MD as Referring  Physician (Family  Medicine) Kerrin Elspeth BROCKS, MD as Consulting Physician (Cardiothoracic Surgery)  This Provider for this visit: Treatment Team:  Attending Provider: Geronimo Amel, MD    09/24/2020 -   Chief Complaint  Patient presents with   Follow-up    Review PFT results. Still reports cough, sometimes productive with clear phlegm.      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 Interestingly, there are scattered non-necrotizing granulomata in the pleura.  -Second opinion September 2016 at [facility]: pathology was reviewed at our multidisciplinary ILD conference and noted to have a variegated pattern of fibrosis with some airway centricity, granulomas noted both near the airway and more peripherally. Minimal inflammation was noted. Features not c/w with UIP. Few giant cells with cholesterol clefts. Overall pattern most in keeping with chronic HP   - 3rd opinon: June 2021 [facility] [place] - Hypersensitivity Pneumonitis   -High-resolution CT chest including April 09, 2018: Lack of craniocaudal gradient and significant air trapping suggestive of chronic hypersensitivity pneumonitis [September 2016 [facility] evaluation thought CT was inconsistent with UIP]  - May 2021: stability since 2019 but progression sice 2016  -Clinical diagnosis of chronic hypersensitivity pneumonitis   - dx [facility]  -No clear-cut exposure identified at [facility] evaluation September 2016 by Dr. Hollis  -Steroids recommended but patient preferred not to take it September 2016 at [facility]   -Treatment   - deferred steroids sept 2016 visit at [facility] with Dr Hollis and again at [facility] in June 2021, Apr 2022  - nintedanib  sole Rx since March  2020       HPI Tykira KLARYSSA FAUTH 83 y.o. -presents for follow-up.  Last seen in the fall 2021.  After that overall she is been stable but last month she picked up a respiratory infection got antibiotic and prednisone  and then she is better.  Nevertheless she thinks her symptoms may be a little worse than baseline.  Still she says she gets by with 3-4 L of oxygen  which is her baseline.  Subjective symptom score overall has not changed.  She had pulmonary function test today and this shows a decline it is all documented below.  I visualized this.  Her last CT scan of the chest was in May 2021.  1 year anniversary of the CT scan is coming up.  Over the winter she declined to have right heart catheterization because of COVID-19.  But at this point in time with the improvement in the pandemic prevalence rate in the local community of COVID she is willing to have a right heart catheterization.  She is tolerating nintedanib  just fine without any problems.  A walking desaturation test on room air is as below      OV 10/27/2020  Subjective:  Patient ID: [name], female , DOB: 03/05/1941 , age 17 y.o. , MRN: [id number] , ADDRESS: [address] [place] [address] PCP Okey Carlin Redbird, MD Patient Care Team: Okey Carlin Redbird, MD as PCP - General (Family Medicine) Rolan Ezra RAMAN, MD as PCP - Advanced Heart Failure (Cardiology) Morgan Agent, MD as Referring Physician (Family Medicine) Kerrin Elspeth BROCKS, MD as Consulting Physician (Cardiothoracic Surgery)  This Provider for this visit: Treatment Team:  Attending Provider:  Geronimo Amel, MD   #Interstitial lung disease (progressive) with chronic hypoxemic respiratory failure - chronic HP dx -Per [facility] September 2016: Disease present at least since 2008  - Surgical lung biopsy  November 20, 2014 at :   - local opinion: diagnostic of usual interstitial pneumonia (UIP). Interestingly, there  are scattered non-necrotizing granulomata in the pleura.  -Second opinion September 2016 at [facility]: pathology was reviewed at our multidisciplinary ILD conference and noted to have a variegated pattern of fibrosis with some airway centricity, granulomas noted both near the airway and more peripherally. Minimal inflammation was noted. Features not c/w with UIP. Few giant cells with cholesterol clefts. Overall pattern most in keeping with chronic HP   - 3rd opinon: June 2021 [facility] [place] - Hypersensitivity Pneumonitis   -High-resolution CT chest including April 09, 2018: Lack of craniocaudal gradient and significant air trapping suggestive of chronic hypersensitivity pneumonitis [September 2016 [facility] evaluation thought CT was inconsistent with UIP]  - May 2021: stability since 2019 but progression sice 2016  -Clinical diagnosis of chronic hypersensitivity pneumonitis   - [facility]  -No clear-cut exposure identified at [facility] evaluation September 2016 by Dr. Hollis  -Steroids recommended but patient preferred not to take it September 2016 at [facility]   -Treatment   - deferred steroids sept 2016 visit at [facility] with Dr Hollis and again at [facility] in June 2021, Apr 2022  - nintedanib  sole Rx since March 2020    10/27/2020 -  TELEPHONE CALL  Type of visit: Telephone/Video Circumstance: COVID-19 national emergency Identification of patient [name] with [birth year] and MRN [id number] - 2 person identifier Risks: Risks, benefits, limitations of telephone visit explained. Patient understood and verbalized agreement to proceed Anyone else on call:  Patient location: [phone] This provider location: [address] [facility], [place], [place]   HPI Reshanda [place] COHICK 83 y.o. -this telephone visit is for a few reasons  1)  grandson getting married in July in [place] and [facility] is elevated. Wants to go. Son Denies thiniong of  taking her to [facility]. It is 4000 feet + /1300 meters. Advised them [place] is safeer from elevation  2) discussed heart catheterization results which are essentially normal.  3) discussed high-resolution CT chest because of concern of progression but in the last 1 year her ILD is stable on nintedanib .  She is happy about this  4) discuss future therapeutic options: At this point in time she is maxed out on therapy.  Not interested in prednisone .  She is on antifibrotic nintedanib .  We discussed the pulse inhaled nitric oxide  study phase 3.  She is very interested in this.  We discussed about the device and also convenience and buying her oxygen  backpack called [employer].  I believe she has a copy of the consent for the study.  We discussed the principles of this.  She is very interested.  She wants to hear from the research coordinator.  I have sent the research coordinator Monita Cera message.  Advised may be a few weeks.   Heart Cath 10/14/20  1. Minimally elevated PA pressure with PVR only 2.1.  2. Normal filling pressures.  3. No significant coronary disease.  [facility] Procedural Findings: Hemodynamics (mmHg) RA mean 4 RV 35/3 PA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  and she has not seen any bronchitis episodes of wheezing.        OV 12/05/2023  Subjective:  Patient ID: [name], female , DOB: 02-20-1941 , age 29 y.o. 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 Interestingly, there are scattered non-necrotizing granulomata in the pleura.  -Second opinion September 2016 at [facility]: pathology was reviewed at our multidisciplinary ILD conference and noted to have a variegated pattern of fibrosis with some airway centricity, granulomas noted both near the airway and more peripherally. Minimal inflammation was noted. Features not c/w with UIP. Few giant cells with cholesterol clefts. Overall pattern most in keeping with chronic HP   - 3rd opinon: June 2021 [facility] [place] - Hypersensitivity Pneumonitis   -High-resolution CT chest including April 09, 2018: Lack of  craniocaudal gradient and significant air trapping suggestive of chronic hypersensitivity pneumonitis [September 2016 [facility] evaluation thought CT was inconsistent with UIP]  - May 2021: stability since 2019 but progression sice 2016  - LAST CT Jan 2023  -Clinical diagnosis of chronic hypersensitivity pneumonitis   - dx given Sept 2016 at [facility]  -No clear-cut exposure identified at [facility] evaluation September 2016 by Dr. Hollis  -Steroids recommended but patient preferred not to take it September 2016 at [facility]   -Positive RAST allergy  panel  (grass, dustmike) and normal IgE but elevated  blood eosinophilia in April 2023  - 500cell April 2023  - 400ccells Sept 2023   - Started Symbicort  June 2023  -Started Fasenra  January 2024. -> stopped aug 2024 due to arthraliga   -Normal right heart catheterization May 2022  -LAST ECHO Echo January 2024 with evidence of pulmonary hypertension: -  Patient held off on right heart catheterization early 2024.  -Unintentional weight loss noted in 2023  = Presumed secondary to nintedanib   -Treatment   - deferred steroids sept 2016 visit at [facility] with Dr Hollis and again at [facility] in June 2021, Apr 2022  - nintedanib  sole Rx since March 2020  -Inhaled nitric oxide  versus placebo research protocol: Withdrawal consent and follow-up March 2023  (study results launche    12/05/2023 -   Chief Complaint  Patient presents with   Follow-up    Breathing is unchanged.      HPI Farron BOBBYJO MARULANDA 83 y.o. -presents for  follow-up.  Nearly a month ago she got admitted to the hospital for acute on chronic hypoxemic respiratory failure.  Diagnosed with pneumonia not otherwise specified.  CT angiogram chest showed increased consolidation.  Since then she is returned to baseline she says.  But she is on 5 L oxygen  at rest [previous baseline was 3-4 L.  Currently saturating 92%.  She is physically deconditioned and weak but she says she is getting better  physical therapy is working with her.  She gets around with a walker she is able to do groceries.  During the hospital she was diagnosed with new onset atrial fibrillation and is on Cardizem  and Eliquis .  She wanted me to stop this but I told her that she should discuss with cardiology.  She continues on nintedanib .  Recent labs in the hospital was at his factoryI think were satisfactory..  These were reviewed.  November 13, 2023 creatinine 0.35 mg percent, hemoglobin 12.7 and on November 09, 2023 liver function test was normal.  All along her BNP has been normal but in the hospital it went up but currently she is euvolemic.  She did have an echocardiogram and  NO PAH   Last Weight  Most recent update: 12/05/2023  4:09 PM    Weight  74.8 kg (165 lb)                SYMPTOM SCALE - ILD 07/29/2019  10/03/2019  12/03/2019 217# 03/09/2020  09/24/2020 207# 08/25/2021 - withdrew from iNP sutdy Weight 201# 09/28/2021 - 202#  11/25/2021 - 193#  02/22/2022 194# 05/17/2022  01/12/2023  04/11/2023  12/05/2023 165#  O2 use 3L at rest, occ 4L with exertion 3L at rest and 4L with exertion. Acute bronchitis 2 weeks ago 3L rest, 4 L exertion 3-4L [place] On ofev . Doing rehab 3/4L [place] pulse . On ofev  3L rest, 4L with eretiion. Weight 201# Treated oxygen  with rest and 4 L exertion and pulse 3.5L rest, 4-5L exertion 4L Max  4L 4 L at rest and 5 L with heavy exertion. Ofev , fassnra 4 L at rest 5 L a heavy exertion.  On nintedanib .  Off Fasenra .  Dealing with chronic pain. 5 L at rest posthospitalization for pneumonia  Shortness of Breath 0 -> 5 scale with 5 being worst (score 6 If unable to do)              At rest 0 1 0 0 0 1 0 1 1 0  1  0  Simple tasks - showers, clothes change, eating, shaving 1 2 2 2 1 3 2 2 3 1 1  3.54 1  Household (dishes, doing bed, laundry) 2 3 2 2  2.5 3 4 3 4 3 2 4 2   Shopping 2 2 2 2  2.5 2 3 2 3 2 2  3.5 2  Walking level at own pace 1 2 2 2 2 2 3 3 3 3 1 4 2   Walking up Stairs 6 4 4 6 5 6 6 5 6 5 5  x 6   Total (30-36) Dyspnea Score 12 14 12 14 13 17 18 16 20 14 12 15 14   How bad is your cough? 1 3.5  More often lateley 2 3 4 2 3 3 1  - fasneral 2.5 1  How bad is your fatigue 0 3.5 2 Better with exercise 2.5 3 4 3 3 3 1  3.5 2  How bad is nausea 0 0 0 0 0 0 0 0 0 0  0 0  0  How bad is vomiting?  0 0 0 0 0 00 0 0 0 0  0 0 0  How bad is diarrhea? 0 0 0 0 0 0 0 0 0 0  0 0 0  How bad is anxiety? 1 1.5 1 1 1  00 1 1 2 1  0 1 2  How bad is depression 1 1 1 1  0 0 1 1 2 1  00 1 1    Simple office walk 185 feet x  3 laps goal with forehead probe 09/24/2020  09/28/2021  04/11/2023  08/29/2023   O2 used ra (baseline uses 3-4L at home) ra ra 4L [place]  Number laps completed End of 1 lap out of intented 3 Stoppsed at 3/4 of 1st lap  1 alp of 3 n 4L [place]  Comments about pace slow avg with walker  With waker, avg pace  Resting Pulse Ox/HR 94% and 87/min 95% and HR 83 93% room air at rest 97% nd HR 88  Final Pulse Ox/HR 85% and 113/min 82% and HR 89  91% and HR 114  Desaturated </= 88% yes     Desaturated <= 3% points yes     Got Tachycardic >/= 90/min yes     Symptoms at end of test Mod dyspnea   dyspneic  Miscellaneous comments desat at end iof 1 lap. STable on room air       PFT     Latest Ref Rng & Units 08/10/2023    2:02 PM 09/23/2022   12:14 PM 11/25/2021    3:10 PM 09/24/2020    2:56 PM 12/03/2019   11:39 AM 12/03/2019    9:57 AM 07/19/2019    3:54 PM  PFT Results  FVC-Pre L 1.23  1.46  1.58  1.52  1.70  1.66  1.61   FVC-Predicted Pre % 46  52  54  50  57  55  53   FVC-Post L       1.87   FVC-Predicted Post %       61   Pre FEV1/FVC % % 80  82  79  80  81  82  81   Post FEV1/FCV % %       78   FEV1-Pre L 0.98  1.20  1.25  1.21  1.39  1.36  1.31   FEV1-Predicted Pre % 50  57  57  54  62  60  57   FEV1-Post L       1.46   DLCO uncorrected ml/min/mmHg 14.22  11.24  11.86  5.95  15.25  13.20  17.11   DLCO UNC% % 73  56  57  28  73  63  82   DLCO corrected ml/min/mmHg 14.22  11.24  11.97  5.95  15.25   13.20    DLCO COR %Predicted % 73  56  58  28  73  63    DLVA Predicted % 99  88  94  67  114  103  120   TLC L       3.63   TLC % Predicted %       65   RV % Predicted %       73        LAB RESULTS last 96 hours No results found.       has a past medical history of Acute on chronic respiratory failure with hypoxia (HCC) (06/13/2016), Allergic rhinitis, Anxiety, Arthritis, Asthma, Carpal tunnel  syndrome, CHF (congestive heart failure) (HCC), GERD (gastroesophageal reflux disease), History of bronchitis (3-29yrs ago), History of colon polyps, Joint pain, Peripheral edema, Pneumonitis, hypersensitivity (HCC) (05/2016), Pulmonary hypertension (HCC) (06/30/2017), Shortness of breath dyspnea, and Thyroid  nodule.   reports that she has never smoked. She has never used smokeless tobacco.  Past Surgical History:  Procedure Laterality Date   BREAST CYST EXCISION Left 50 yrs ago   CARPELL TUNNELL SURG Right    COLONOSCOPY     ESOPHAGOGASTRODUODENOSCOPY     EYE SURGERY     bilateral cataract surgery with lens implants   KNEE ARTHROSCOPY W/ MENISCAL REPAIR     left   LUNG BIOPSY Right 11/20/2014   Procedure: LUNG BIOPSY;  Surgeon: Elspeth JAYSON Millers, MD;  Location: [facility] OR;  Service: Thoracic;  Laterality: Right;   REVERSE SHOULDER ARTHROPLASTY Right 06/07/2018   Procedure: REVERSE SHOULDER ARTHROPLASTY;  Surgeon: Melita Drivers, MD;  Location: [facility] ORS;  Service: Orthopedics;  Laterality: Right;    RIGHT/LEFT HEART CATH AND CORONARY ANGIOGRAPHY N/A 10/14/2020   Procedure: RIGHT/LEFT HEART CATH AND CORONARY ANGIOGRAPHY;  Surgeon: Rolan Ezra RAMAN, MD;  Location: [facility] INVASIVE CV LAB;  Service: Cardiovascular;  Laterality: N/A;   TOTAL KNEE ARTHROPLASTY Left 08/13/2013   Procedure: LEFT TOTAL KNEE ARTHROPLASTY;  Surgeon: Donnice JONETTA Car, MD;  Location: [facility] ORS;  Service: Orthopedics;  Laterality: Left;   UMBILICAL HERNIA REPAIR     VIDEO ASSISTED THORACOSCOPY Right 11/20/2014   Procedure: RIGHT   VIDEO ASSISTED THORACOSCOPY WITH WEDGE LUNG BIOPSIES RIGHT UPPER,MIDDLE AND LOWER LOBES ,PLACEMENT OF ON Q PAIN PUMP;  Surgeon: Elspeth JAYSON Millers, MD;  Location: [facility] OR;  Service: Thoracic;  Laterality: Right;    Allergies  Allergen Reactions   Adhesive [Tape] Itching and Rash   Latex Itching and Swelling   Doxycycline  Rash   Sulfonamide Derivatives Rash and Other (See Comments)    Immunization History  Administered Date(s) Administered   Fluad Quad(high Dose 65+) 02/07/2019, 05/06/2021, 02/22/2022   Fluzone  Influenza virus vaccine,trivalent (IIV3), split virus 02/28/2012, 03/02/2015   Influenza Split 04/20/2016, 02/12/2018   Influenza, High Dose Seasonal PF 04/20/2016, 02/23/2017, 02/12/2018   Influenza,inj,quad, With Preservative 04/14/2014   Influenza-Unspecified 03/06/2014, 02/18/2015   PFIZER(Purple Top)SARS-COV-2 Vaccination 07/12/2019, 08/06/2019, 04/12/2020   Pneumococcal Conjugate-13 04/14/2014   Pneumococcal Polysaccharide-23 06/14/2017   Tdap 06/14/2017   Zoster, Live 04/24/2013    Family History  Problem Relation Age of Onset   Tuberculosis Father    Tuberculosis Paternal Uncle      Current Outpatient Medications:    apixaban  (ELIQUIS ) 5 MG TABS tablet, Take 1 tablet (5 mg total) by mouth 2 (two) times daily., Disp: 60 tablet, Rfl: 1   diltiazem  (CARDIZEM  CD) 120 MG 24 hr capsule, Take 1 capsule (120 mg total) by mouth daily., Disp: 30 capsule, Rfl: 1   fluticasone  (FLONASE ) 50 MCG/ACT nasal spray, Place 1 spray into both nostrils daily as needed for allergies. , Disp: , Rfl:    furosemide  (LASIX ) 20 MG tablet, Take 20 mg by mouth daily as needed for edema., Disp: , Rfl:    guaiFENesin  (ROBITUSSIN) 100 MG/5ML liquid, Take 5 mLs by mouth every 4 (four) hours as needed for cough or to loosen phlegm., Disp: 118 mL, Rfl: 0   loratadine  (CLARITIN ) 5 MG chewable tablet, Chew 5-10 mg by mouth daily as needed for allergies. , Disp: , Rfl:    Nintedanib  (OFEV ) 150 MG  CAPS, Take 1 capsule (150 mg total) by mouth 2 (two)  times daily with a meal., Disp: 180 capsule, Rfl: 1   sodium chloride  HYPERTONIC 3 % nebulizer solution, Take by nebulization in the morning and at bedtime. Dx J47.1 (Patient taking differently: Take 4 mLs by nebulization as needed for other or cough.), Disp: 750 mL, Rfl: 1   VENTOLIN  HFA 108 (90 Base) MCG/ACT inhaler, Inhale 2 puffs into the lungs every 6 (six) hours as needed for wheezing or shortness of breath., Disp: 18 g, Rfl: 6   vitamin B-12 (CYANOCOBALAMIN) 1000 MCG tablet, Take 1,000 mcg by mouth every other day., Disp: , Rfl:    Cholecalciferol (DIALYVITE VITAMIN D 5000) 125 MCG (5000 UT) capsule, Take 5,000 Units by mouth daily. (Patient not taking: Reported on 12/05/2023), Disp: , Rfl:    linezolid  (ZYVOX ) 600 MG tablet, Take 1 tablet (600 mg total) by mouth 2 (two) times daily., Disp: 10 tablet, Rfl: 0   Multiple Vitamin (MULTIVITAMIN WITH MINERALS) TABS tablet, Take 1 tablet by mouth daily. (Patient not taking: Reported on 12/05/2023), Disp: , Rfl:       Objective:   Vitals:   12/05/23 1607 12/05/23 1608  BP:  134/64  Pulse: 83   SpO2: 92%   Weight:  165 lb (74.8 kg)  Height:  5' 5 (1.651 m)    Estimated body mass index is 27.46 kg/m as calculated from the following:   Height as of this encounter: 5' 5 (1.651 m).   Weight as of this encounter: 165 lb (74.8 kg).  @WEIGHTCHANGE @  Filed Weights   12/05/23 1608  Weight: 165 lb (74.8 kg)     Physical Exam   General: No distress. Frail  O2 at rest: YES 5L Cane present: no Sitting in wheel chair: no but has walker Frail: no Obese: no Neuro: Alert and Oriented x 3. GCS 15. Speech normal Psych: Pleasant Resp:  Barrel Chest - no.  Wheeze - no, Crackles - yes diffus3, No overt respiratory distress CVS: Normal heart sounds. Murmurs - no Ext: Stigmata of Connective Tissue Disease - no HEENT: Normal upper airway. PEERL +. No post nasal drip        Assessment:        ICD-10-CM   1. ILD (interstitial lung disease) (HCC)  J84.9 Pulmonary function test    2. Chronic respiratory failure with hypoxia (HCC)  J96.11 Pulmonary function test    3. Hypersensitivity pneumonitis (HCC)  J67.9 Pulmonary function test    4. Hospital discharge follow-up  Z09     5. History of pneumonia  Z87.01          Plan:     Patient Instructions  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 CHronic resp failure with hypoxemia   - though pregressive disease over time  now o 5LNC rest after recent pneumonia from hospitalizaiong = I think ofev  is helping you - And glad you are better _   Plan  -  continue ofev   - continue o2 5 LNC at rest and more with exertion - goal pulse ox > 88% - do  spiro/dlco n 3 months - do cxr in 3 months - can order at next visit    History of recurrent bronchitis with acute bronchitis /asthma flare up - 02/22/2022, Oct 2023, Nov 2023, Dec 2023 History of eosinophilia 2018 and 2023 with postiive RAST panel e-asthma , allergic asthma with flare up  Now off fasenral since summer 2024 and no recurrence Arthralgia likely due to another cause (not fasenra )  Plan  -  Continue inhaler therapy with Flonase , hypertonic saline and albuterol  as needed    Followup  - Twinkle Sockwell visit - 30 min in 3 months but after HRCT followup from pneumonia and PFT  - symptoms score and walk test at followup   FOLLOWUP Return in about 3 months (around 03/06/2024) for 30 min visit, with Dr Geronimo, after Spiro and DLCO.    SIGNATURE    Dr. Dorethia Geronimo, M.D., F.C.C.P,  Pulmonary and Critical Care Medicine Staff Physician, [facility] Director - Interstitial Lung Disease  Program  Pulmonary Fibrosis [facility] [place], [place], [address]  Pager: [phone], If no answer or between  15:00h - 7:00h: call [phone] Telephone: [phone]  5:27 PM 12/05/2023

## 2023-12-05 NOTE — Patient Instructions (Addendum)
 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 CHronic resp failure with hypoxemia   - though pregressive disease over time  now o 5LNC rest after recent pneumonia from hospitalizaiong = I think ofev  is helping you - And glad you are better _   Plan  -  continue ofev   - continue o2 5 LNC at rest and more with exertion - goal pulse ox > 88% - do  spiro/dlco n 3 months - do cxr in 3 months - can order at next visit    History of recurrent bronchitis with acute bronchitis /asthma flare up - 02/22/2022, Oct 2023, Nov 2023, Dec 2023 History of eosinophilia 2018 and 2023 with postiive RAST panel e-asthma , allergic asthma with flare up  Now off fasenral since summer 2024 and no recurrence Arthralgia likely due to another cause (not fasenra )  Plan  - Continue inhaler therapy with Flonase , hypertonic saline and albuterol  as needed    Followup  - Rex Oesterle visit - 30 min in 3 months but after HRCT followup from pneumonia and PFT  - symptoms score and walk test at followup

## 2023-12-07 DIAGNOSIS — J849 Interstitial pulmonary disease, unspecified: Secondary | ICD-10-CM | POA: Diagnosis not present

## 2023-12-07 DIAGNOSIS — J9611 Chronic respiratory failure with hypoxia: Secondary | ICD-10-CM | POA: Diagnosis not present

## 2023-12-07 DIAGNOSIS — I4891 Unspecified atrial fibrillation: Secondary | ICD-10-CM | POA: Diagnosis not present

## 2023-12-07 DIAGNOSIS — R7881 Bacteremia: Secondary | ICD-10-CM | POA: Diagnosis not present

## 2023-12-07 DIAGNOSIS — J13 Pneumonia due to Streptococcus pneumoniae: Secondary | ICD-10-CM | POA: Diagnosis not present

## 2023-12-07 DIAGNOSIS — J471 Bronchiectasis with (acute) exacerbation: Secondary | ICD-10-CM | POA: Diagnosis not present

## 2023-12-11 DIAGNOSIS — J471 Bronchiectasis with (acute) exacerbation: Secondary | ICD-10-CM | POA: Diagnosis not present

## 2023-12-11 DIAGNOSIS — J849 Interstitial pulmonary disease, unspecified: Secondary | ICD-10-CM | POA: Diagnosis not present

## 2023-12-11 DIAGNOSIS — I4891 Unspecified atrial fibrillation: Secondary | ICD-10-CM | POA: Diagnosis not present

## 2023-12-11 DIAGNOSIS — J9611 Chronic respiratory failure with hypoxia: Secondary | ICD-10-CM | POA: Diagnosis not present

## 2023-12-11 DIAGNOSIS — J13 Pneumonia due to Streptococcus pneumoniae: Secondary | ICD-10-CM | POA: Diagnosis not present

## 2023-12-11 DIAGNOSIS — R7881 Bacteremia: Secondary | ICD-10-CM | POA: Diagnosis not present

## 2023-12-14 DIAGNOSIS — I272 Pulmonary hypertension, unspecified: Secondary | ICD-10-CM | POA: Diagnosis not present

## 2023-12-14 DIAGNOSIS — I110 Hypertensive heart disease with heart failure: Secondary | ICD-10-CM | POA: Diagnosis not present

## 2023-12-14 DIAGNOSIS — Z7901 Long term (current) use of anticoagulants: Secondary | ICD-10-CM | POA: Diagnosis not present

## 2023-12-14 DIAGNOSIS — J13 Pneumonia due to Streptococcus pneumoniae: Secondary | ICD-10-CM | POA: Diagnosis not present

## 2023-12-14 DIAGNOSIS — Z96652 Presence of left artificial knee joint: Secondary | ICD-10-CM | POA: Diagnosis not present

## 2023-12-14 DIAGNOSIS — Z9981 Dependence on supplemental oxygen: Secondary | ICD-10-CM | POA: Diagnosis not present

## 2023-12-14 DIAGNOSIS — J471 Bronchiectasis with (acute) exacerbation: Secondary | ICD-10-CM | POA: Diagnosis not present

## 2023-12-14 DIAGNOSIS — I5032 Chronic diastolic (congestive) heart failure: Secondary | ICD-10-CM | POA: Diagnosis not present

## 2023-12-14 DIAGNOSIS — J849 Interstitial pulmonary disease, unspecified: Secondary | ICD-10-CM | POA: Diagnosis not present

## 2023-12-14 DIAGNOSIS — Z96619 Presence of unspecified artificial shoulder joint: Secondary | ICD-10-CM | POA: Diagnosis not present

## 2023-12-14 DIAGNOSIS — E785 Hyperlipidemia, unspecified: Secondary | ICD-10-CM | POA: Diagnosis not present

## 2023-12-14 DIAGNOSIS — E876 Hypokalemia: Secondary | ICD-10-CM | POA: Diagnosis not present

## 2023-12-14 DIAGNOSIS — J9611 Chronic respiratory failure with hypoxia: Secondary | ICD-10-CM | POA: Diagnosis not present

## 2023-12-14 DIAGNOSIS — M069 Rheumatoid arthritis, unspecified: Secondary | ICD-10-CM | POA: Diagnosis not present

## 2023-12-14 DIAGNOSIS — J4550 Severe persistent asthma, uncomplicated: Secondary | ICD-10-CM | POA: Diagnosis not present

## 2023-12-14 DIAGNOSIS — K219 Gastro-esophageal reflux disease without esophagitis: Secondary | ICD-10-CM | POA: Diagnosis not present

## 2023-12-14 DIAGNOSIS — I4891 Unspecified atrial fibrillation: Secondary | ICD-10-CM | POA: Diagnosis not present

## 2023-12-14 DIAGNOSIS — R7881 Bacteremia: Secondary | ICD-10-CM | POA: Diagnosis not present

## 2023-12-14 DIAGNOSIS — Z556 Problems related to health literacy: Secondary | ICD-10-CM | POA: Diagnosis not present

## 2023-12-15 ENCOUNTER — Other Ambulatory Visit (HOSPITAL_COMMUNITY): Payer: Self-pay

## 2023-12-20 DIAGNOSIS — I4891 Unspecified atrial fibrillation: Secondary | ICD-10-CM | POA: Diagnosis not present

## 2023-12-20 DIAGNOSIS — J9611 Chronic respiratory failure with hypoxia: Secondary | ICD-10-CM | POA: Diagnosis not present

## 2023-12-20 DIAGNOSIS — J13 Pneumonia due to Streptococcus pneumoniae: Secondary | ICD-10-CM | POA: Diagnosis not present

## 2023-12-20 DIAGNOSIS — J471 Bronchiectasis with (acute) exacerbation: Secondary | ICD-10-CM | POA: Diagnosis not present

## 2023-12-20 DIAGNOSIS — J849 Interstitial pulmonary disease, unspecified: Secondary | ICD-10-CM | POA: Diagnosis not present

## 2023-12-20 DIAGNOSIS — R7881 Bacteremia: Secondary | ICD-10-CM | POA: Diagnosis not present

## 2023-12-22 ENCOUNTER — Other Ambulatory Visit (HOSPITAL_COMMUNITY): Payer: Self-pay

## 2023-12-27 DIAGNOSIS — R7881 Bacteremia: Secondary | ICD-10-CM | POA: Diagnosis not present

## 2023-12-27 DIAGNOSIS — J9611 Chronic respiratory failure with hypoxia: Secondary | ICD-10-CM | POA: Diagnosis not present

## 2023-12-27 DIAGNOSIS — J471 Bronchiectasis with (acute) exacerbation: Secondary | ICD-10-CM | POA: Diagnosis not present

## 2023-12-27 DIAGNOSIS — J849 Interstitial pulmonary disease, unspecified: Secondary | ICD-10-CM | POA: Diagnosis not present

## 2023-12-27 DIAGNOSIS — I4891 Unspecified atrial fibrillation: Secondary | ICD-10-CM | POA: Diagnosis not present

## 2023-12-27 DIAGNOSIS — J13 Pneumonia due to Streptococcus pneumoniae: Secondary | ICD-10-CM | POA: Diagnosis not present

## 2024-01-02 ENCOUNTER — Telehealth: Payer: Self-pay | Admitting: *Deleted

## 2024-01-02 NOTE — Telephone Encounter (Signed)
 Copied from CRM [id number]. Topic: Clinical - Medication Prior Auth >> Jan 02, 2024  3:26 PM Isabell A wrote: Reason for CRM: Hildegard from [facility] calling to check the status of prior authorization for Nintedanib  (OFEV ) 150 MG CAPS - states its urgent.   Callback number: [phone]

## 2024-01-02 NOTE — Telephone Encounter (Signed)
 Submitted an URGENT Prior Authorization RENEWAL request to [facility] for OFEV  via CoverMyMeds. Will update once we receive a response.  Key: [facility]

## 2024-01-03 NOTE — Telephone Encounter (Signed)
 Received notification from [facility] regarding a prior authorization for OFEV . Authorization has been APPROVED from 01/02/2024 to 01/01/2025. Approval letter sent to scan center.  Authorization # [id number]  Sherry Pennant, PharmD, MPH, BCPS, CPP Clinical Pharmacist (Rheumatology and Pulmonology)

## 2024-01-12 DIAGNOSIS — J849 Interstitial pulmonary disease, unspecified: Secondary | ICD-10-CM | POA: Diagnosis not present

## 2024-01-12 DIAGNOSIS — I4891 Unspecified atrial fibrillation: Secondary | ICD-10-CM | POA: Diagnosis not present

## 2024-01-12 DIAGNOSIS — R7881 Bacteremia: Secondary | ICD-10-CM | POA: Diagnosis not present

## 2024-01-12 DIAGNOSIS — J9611 Chronic respiratory failure with hypoxia: Secondary | ICD-10-CM | POA: Diagnosis not present

## 2024-01-12 DIAGNOSIS — J13 Pneumonia due to Streptococcus pneumoniae: Secondary | ICD-10-CM | POA: Diagnosis not present

## 2024-01-12 DIAGNOSIS — J471 Bronchiectasis with (acute) exacerbation: Secondary | ICD-10-CM | POA: Diagnosis not present

## 2024-01-13 DIAGNOSIS — J8410 Pulmonary fibrosis, unspecified: Secondary | ICD-10-CM | POA: Diagnosis not present

## 2024-01-13 DIAGNOSIS — M069 Rheumatoid arthritis, unspecified: Secondary | ICD-10-CM | POA: Diagnosis not present

## 2024-01-13 DIAGNOSIS — I272 Pulmonary hypertension, unspecified: Secondary | ICD-10-CM | POA: Diagnosis not present

## 2024-01-13 DIAGNOSIS — J9611 Chronic respiratory failure with hypoxia: Secondary | ICD-10-CM | POA: Diagnosis not present

## 2024-01-13 DIAGNOSIS — J479 Bronchiectasis, uncomplicated: Secondary | ICD-10-CM | POA: Diagnosis not present

## 2024-01-13 DIAGNOSIS — Z7901 Long term (current) use of anticoagulants: Secondary | ICD-10-CM | POA: Diagnosis not present

## 2024-01-13 DIAGNOSIS — I5032 Chronic diastolic (congestive) heart failure: Secondary | ICD-10-CM | POA: Diagnosis not present

## 2024-01-13 DIAGNOSIS — J4550 Severe persistent asthma, uncomplicated: Secondary | ICD-10-CM | POA: Diagnosis not present

## 2024-01-13 DIAGNOSIS — I110 Hypertensive heart disease with heart failure: Secondary | ICD-10-CM | POA: Diagnosis not present

## 2024-01-13 DIAGNOSIS — E785 Hyperlipidemia, unspecified: Secondary | ICD-10-CM | POA: Diagnosis not present

## 2024-01-13 DIAGNOSIS — Z96652 Presence of left artificial knee joint: Secondary | ICD-10-CM | POA: Diagnosis not present

## 2024-01-13 DIAGNOSIS — Z96611 Presence of right artificial shoulder joint: Secondary | ICD-10-CM | POA: Diagnosis not present

## 2024-01-13 DIAGNOSIS — I4891 Unspecified atrial fibrillation: Secondary | ICD-10-CM | POA: Diagnosis not present

## 2024-01-13 DIAGNOSIS — Z9981 Dependence on supplemental oxygen: Secondary | ICD-10-CM | POA: Diagnosis not present

## 2024-01-13 DIAGNOSIS — Z556 Problems related to health literacy: Secondary | ICD-10-CM | POA: Diagnosis not present

## 2024-01-13 DIAGNOSIS — K219 Gastro-esophageal reflux disease without esophagitis: Secondary | ICD-10-CM | POA: Diagnosis not present

## 2024-01-16 DIAGNOSIS — J8410 Pulmonary fibrosis, unspecified: Secondary | ICD-10-CM | POA: Diagnosis not present

## 2024-01-16 DIAGNOSIS — M542 Cervicalgia: Secondary | ICD-10-CM | POA: Diagnosis not present

## 2024-01-16 DIAGNOSIS — I4891 Unspecified atrial fibrillation: Secondary | ICD-10-CM | POA: Diagnosis not present

## 2024-01-17 DIAGNOSIS — J8410 Pulmonary fibrosis, unspecified: Secondary | ICD-10-CM | POA: Diagnosis not present

## 2024-01-17 DIAGNOSIS — J9611 Chronic respiratory failure with hypoxia: Secondary | ICD-10-CM | POA: Diagnosis not present

## 2024-01-17 DIAGNOSIS — I4891 Unspecified atrial fibrillation: Secondary | ICD-10-CM | POA: Diagnosis not present

## 2024-01-17 DIAGNOSIS — M069 Rheumatoid arthritis, unspecified: Secondary | ICD-10-CM | POA: Diagnosis not present

## 2024-01-17 DIAGNOSIS — I272 Pulmonary hypertension, unspecified: Secondary | ICD-10-CM | POA: Diagnosis not present

## 2024-01-17 DIAGNOSIS — J479 Bronchiectasis, uncomplicated: Secondary | ICD-10-CM | POA: Diagnosis not present

## 2024-01-19 DIAGNOSIS — J479 Bronchiectasis, uncomplicated: Secondary | ICD-10-CM | POA: Diagnosis not present

## 2024-01-19 DIAGNOSIS — J9611 Chronic respiratory failure with hypoxia: Secondary | ICD-10-CM | POA: Diagnosis not present

## 2024-01-19 DIAGNOSIS — J8410 Pulmonary fibrosis, unspecified: Secondary | ICD-10-CM | POA: Diagnosis not present

## 2024-01-19 DIAGNOSIS — M069 Rheumatoid arthritis, unspecified: Secondary | ICD-10-CM | POA: Diagnosis not present

## 2024-01-19 DIAGNOSIS — I4891 Unspecified atrial fibrillation: Secondary | ICD-10-CM | POA: Diagnosis not present

## 2024-01-19 DIAGNOSIS — I272 Pulmonary hypertension, unspecified: Secondary | ICD-10-CM | POA: Diagnosis not present

## 2024-01-24 DIAGNOSIS — M069 Rheumatoid arthritis, unspecified: Secondary | ICD-10-CM | POA: Diagnosis not present

## 2024-01-24 DIAGNOSIS — J9611 Chronic respiratory failure with hypoxia: Secondary | ICD-10-CM | POA: Diagnosis not present

## 2024-01-24 DIAGNOSIS — J479 Bronchiectasis, uncomplicated: Secondary | ICD-10-CM | POA: Diagnosis not present

## 2024-01-24 DIAGNOSIS — I272 Pulmonary hypertension, unspecified: Secondary | ICD-10-CM | POA: Diagnosis not present

## 2024-01-24 DIAGNOSIS — I4891 Unspecified atrial fibrillation: Secondary | ICD-10-CM | POA: Diagnosis not present

## 2024-01-24 DIAGNOSIS — J8410 Pulmonary fibrosis, unspecified: Secondary | ICD-10-CM | POA: Diagnosis not present

## 2024-01-26 DIAGNOSIS — J9611 Chronic respiratory failure with hypoxia: Secondary | ICD-10-CM | POA: Diagnosis not present

## 2024-01-26 DIAGNOSIS — J479 Bronchiectasis, uncomplicated: Secondary | ICD-10-CM | POA: Diagnosis not present

## 2024-01-26 DIAGNOSIS — I4891 Unspecified atrial fibrillation: Secondary | ICD-10-CM | POA: Diagnosis not present

## 2024-01-26 DIAGNOSIS — J8410 Pulmonary fibrosis, unspecified: Secondary | ICD-10-CM | POA: Diagnosis not present

## 2024-01-26 DIAGNOSIS — M069 Rheumatoid arthritis, unspecified: Secondary | ICD-10-CM | POA: Diagnosis not present

## 2024-01-26 DIAGNOSIS — I272 Pulmonary hypertension, unspecified: Secondary | ICD-10-CM | POA: Diagnosis not present

## 2024-01-29 DIAGNOSIS — J479 Bronchiectasis, uncomplicated: Secondary | ICD-10-CM | POA: Diagnosis not present

## 2024-01-29 DIAGNOSIS — J8410 Pulmonary fibrosis, unspecified: Secondary | ICD-10-CM | POA: Diagnosis not present

## 2024-01-29 DIAGNOSIS — I272 Pulmonary hypertension, unspecified: Secondary | ICD-10-CM | POA: Diagnosis not present

## 2024-01-29 DIAGNOSIS — J9611 Chronic respiratory failure with hypoxia: Secondary | ICD-10-CM | POA: Diagnosis not present

## 2024-01-29 DIAGNOSIS — I4891 Unspecified atrial fibrillation: Secondary | ICD-10-CM | POA: Diagnosis not present

## 2024-01-29 DIAGNOSIS — M069 Rheumatoid arthritis, unspecified: Secondary | ICD-10-CM | POA: Diagnosis not present

## 2024-01-31 DIAGNOSIS — I272 Pulmonary hypertension, unspecified: Secondary | ICD-10-CM | POA: Diagnosis not present

## 2024-01-31 DIAGNOSIS — I4891 Unspecified atrial fibrillation: Secondary | ICD-10-CM | POA: Diagnosis not present

## 2024-01-31 DIAGNOSIS — M069 Rheumatoid arthritis, unspecified: Secondary | ICD-10-CM | POA: Diagnosis not present

## 2024-01-31 DIAGNOSIS — J9611 Chronic respiratory failure with hypoxia: Secondary | ICD-10-CM | POA: Diagnosis not present

## 2024-01-31 DIAGNOSIS — J479 Bronchiectasis, uncomplicated: Secondary | ICD-10-CM | POA: Diagnosis not present

## 2024-01-31 DIAGNOSIS — J8410 Pulmonary fibrosis, unspecified: Secondary | ICD-10-CM | POA: Diagnosis not present

## 2024-02-09 DIAGNOSIS — J9611 Chronic respiratory failure with hypoxia: Secondary | ICD-10-CM | POA: Diagnosis not present

## 2024-02-09 DIAGNOSIS — I4891 Unspecified atrial fibrillation: Secondary | ICD-10-CM | POA: Diagnosis not present

## 2024-02-09 DIAGNOSIS — J479 Bronchiectasis, uncomplicated: Secondary | ICD-10-CM | POA: Diagnosis not present

## 2024-02-09 DIAGNOSIS — J8410 Pulmonary fibrosis, unspecified: Secondary | ICD-10-CM | POA: Diagnosis not present

## 2024-02-09 DIAGNOSIS — I272 Pulmonary hypertension, unspecified: Secondary | ICD-10-CM | POA: Diagnosis not present

## 2024-02-09 DIAGNOSIS — M069 Rheumatoid arthritis, unspecified: Secondary | ICD-10-CM | POA: Diagnosis not present

## 2024-02-13 ENCOUNTER — Ambulatory Visit: Payer: Self-pay | Admitting: Internal Medicine

## 2024-02-13 ENCOUNTER — Encounter: Payer: Self-pay | Admitting: Internal Medicine

## 2024-02-13 ENCOUNTER — Ambulatory Visit (INDEPENDENT_AMBULATORY_CARE_PROVIDER_SITE_OTHER)

## 2024-02-13 ENCOUNTER — Ambulatory Visit (INDEPENDENT_AMBULATORY_CARE_PROVIDER_SITE_OTHER): Admitting: Internal Medicine

## 2024-02-13 ENCOUNTER — Encounter

## 2024-02-13 ENCOUNTER — Ambulatory Visit: Admitting: Internal Medicine

## 2024-02-13 VITALS — BP 149/83 | HR 77 | Temp 98.2°F | Ht 65.25 in | Wt 162.0 lb

## 2024-02-13 DIAGNOSIS — R296 Repeated falls: Secondary | ICD-10-CM

## 2024-02-13 DIAGNOSIS — Z8701 Personal history of pneumonia (recurrent): Secondary | ICD-10-CM | POA: Diagnosis not present

## 2024-02-13 DIAGNOSIS — I700 Atherosclerosis of aorta: Secondary | ICD-10-CM | POA: Diagnosis not present

## 2024-02-13 DIAGNOSIS — J849 Interstitial pulmonary disease, unspecified: Secondary | ICD-10-CM

## 2024-02-13 DIAGNOSIS — Z96611 Presence of right artificial shoulder joint: Secondary | ICD-10-CM | POA: Diagnosis not present

## 2024-02-13 DIAGNOSIS — J679 Hypersensitivity pneumonitis due to unspecified organic dust: Secondary | ICD-10-CM

## 2024-02-13 DIAGNOSIS — I771 Stricture of artery: Secondary | ICD-10-CM | POA: Diagnosis not present

## 2024-02-13 DIAGNOSIS — R5381 Other malaise: Secondary | ICD-10-CM

## 2024-02-13 DIAGNOSIS — Z5181 Encounter for therapeutic drug level monitoring: Secondary | ICD-10-CM | POA: Diagnosis not present

## 2024-02-13 DIAGNOSIS — J9611 Chronic respiratory failure with hypoxia: Secondary | ICD-10-CM

## 2024-02-13 LAB — PULMONARY FUNCTION TEST
DL/VA % pred: 78 %
DL/VA: 3.14 ml/min/mmHg/L
DLCO unc % pred: 45 %
DLCO unc: 8.84 ml/min/mmHg
FEF 25-75 Pre: 0.72 L/s
FEF2575-%Pred-Pre: 56 %
FEV1-%Pred-Pre: 54 %
FEV1-Pre: 1.04 L
FEV1FVC-%Pred-Pre: 102 %
FEV6-%Pred-Pre: 56 %
FEV6-Pre: 1.39 L
FEV6FVC-%Pred-Pre: 105 %
FVC-%Pred-Pre: 53 %
FVC-Pre: 1.39 L
Pre FEV1/FVC ratio: 75 %
Pre FEV6/FVC Ratio: 100 %

## 2024-02-13 NOTE — Patient Instructions (Addendum)
 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 CHronic resp failure with hypoxemia Therapeutic drug monitoring   - though pregressive disease over time  stabilized now at f4L [place] for some time  - PFT is mixed picture but on average is stable = I think ofev  is helping you - And glad you are better _   Plan  -  continue ofev   - continue o2 4-5 LNC at rest and more with exertion - goal pulse ox > 88% - LFT 02/13/2024 or with PCP next week - do  spiro/dlco in 5 months   History Pneumonia June 2025   - clinically well  Plan  - followup CXR 02/13/2024    History of recurrent bronchitis with acute bronchitis /asthma flare up - 02/22/2022, Oct 2023, Nov 2023, Dec 2023 History of eosinophilia 2018 and 2023 with postiive RAST panel e-asthma , allergic asthma with flare up  Now off fasenral since summer 2024 and no recurrence Arthralgia likely due to another cause (not fasenra )  Plan  - Continue inhaler therapy with Flonase , hypertonic saline and albuterol  as needed  Falls Physical Deconditining  Stopped afer stopping cardizem  Now stronger after June 2025 admit  Plan  - talk to PCP Okey Carlin Redbird, MD about getting more PT  Followup  - Noemie Devivo visit - 30 min in 5 months but after spirometry  - symptoms score and walk test at followup

## 2024-02-13 NOTE — Progress Notes (Signed)
Spiro/DLCO performed today. 

## 2024-02-13 NOTE — Progress Notes (Signed)
 xxxxxxxxxxxxxxxxxxxxx  OV 07/29/2019 -transfer of care to Dr. Geronimo interstitial lung disease center.  Subjective:  Patient ID: [name], female , DOB: 05/17/1941 , age 83 y.o. , MRN: [id number] , ADDRESS: [address] [place] [address]   07/29/2019 -   Chief Complaint  Patient presents with   Follow-up    Pt states her breathing has become worse since last visit. States she also has an occ cough.   #Interstitial lung disease (progressive) with chronic hypoxemic respiratory failure - chronic HP dx -Per [facility] March 18, 2015: Disease present at least since 2007-03-18  - Surgical lung biopsy  November 20, 2014 at [place]:   - local opinion: diagnostic of usual interstitial pneumonia (UIP). Interestingly, there are scattered non-necrotizing granulomata in the pleura.  -Second opinion October 12, 2016at [facility]: pathology was reviewed at our multidisciplinary ILD conference and noted to have a variegated pattern of fibrosis with some airway centricity, granulomas noted both near the airway and more peripherally. Minimal inflammation was noted. Features not c/w with UIP. Few giant cells with cholesterol clefts. Overall pattern most in keeping with chronic HP  -High-resolution CT chest including April 09, 2018: Lack of craniocaudal gradient and significant air trapping suggestive of chronic hypersensitivity pneumonitis [10-12-2016[facility] evaluation thought CT was inconsistent with UIP]  -Clinical diagnosis of chronic hypersensitivity pneumonitis   - dx [facility]-2015 [facility]  -No clear-cut exposure identified at [facility] evaluation 12-Oct-2016by Dr. Hollis  -Steroids recommended but patient preferred not to take it October 12, 2016at [facility]  -Treatment   - deferred steroids sept 03/18/2015 visit at [facility] with Dr Carleton  - nintedanib  since March 2020   #Small hiatal hernia with acid reflux disease  HPI [name] 83 y.o. -is a  transfer of care from Dr. Alaine to Dr. Geronimo with interstitial lung disease questionnaire.  She was last seen by Dr. McQuaid in March 2020.  After the onset of the COVID-19 pandemic Dr. McQuaid is now fully in charge of the Covid hospital [facility].  Therefore he is not in the outpatient medical practice anymore.  Therefore patient has been transferred to the ILD center to Dr. Geronimo myself.  Is the first time I am meeting her and getting to know her.  She tells me that I took care of her husband for COPD a few years ago.  This was her ex-husband.  He passed away in 03-17-12 after hospitalization to the ICU under our service.  She is grateful for the care.  She tells me that she has interstitial lung disease and for the last year she has been on nintedanib .  Currently she is having difficulty with nintedanib  supply.  She only has 2 weeks left.  She uses 3 L of oxygen  at rest.  She says she has been doing that for the last 4 years.  It is stable usage.  I am not really sure what her pulse ox on room air would be.  She says occasionally she would crank it up to 4 L for exertion but she generally uses 3 L both at rest and with exertion.  She says she tolerates nintedanib  fine. Her interstitial lung disease history was reviewed and summarized above.  It appears that she was not keen on taking prednisone  and her 03-18-2015 visit with Dr. Hollis at [facility].  Therefore once the INBUILD data was available she was started on nintedanib   a year ago.She wants to have a son involved in her visits.  Because of the COVID-19 pandemic he is waiting outside in the car.  She says at the next visit she wants to bring him in.   Her symptom score is listed below.  Her progressive lung function decline is also listed below and the pulmonary function test reviewed.  I personally reviewed and visualized and interpreted those.  Her DLCO is paradoxically higher.  I think this is an error.   ROS - per  HPI  OV 10/03/2019  Subjective:  Patient ID: [name], female , DOB: [birth year] , age 34 y.o. , MRN: [id number] , ADDRESS: [address] [place] [address]   #Interstitial lung disease (progressive) with chronic hypoxemic respiratory failure - chronic HP dx -Per [facility] September 2016: Disease present at least since 2008  - Surgical lung biopsy  November 20, 2014 at [place]:   - local opinion: diagnostic of usual interstitial pneumonia (UIP). Interestingly, there are scattered non-necrotizing granulomata in the pleura.  -Second opinion September 2016 at [facility]: pathology was reviewed at our multidisciplinary ILD conference and noted to have a variegated pattern of fibrosis with some airway centricity, granulomas noted both near the airway and more peripherally. Minimal inflammation was noted. Features not c/w with UIP. Few giant cells with cholesterol clefts. Overall pattern most in keeping with chronic HP  -High-resolution CT chest including April 09, 2018: Lack of craniocaudal gradient and significant air trapping suggestive of chronic hypersensitivity pneumonitis [September 2016 [facility] evaluation thought CT was inconsistent with UIP]  -Clinical diagnosis of chronic hypersensitivity pneumonitis   - dx given [facility] 2016 at [facility]  -No clear-cut exposure identified at [facility] evaluation September 2016 by Dr. Hollis  -Steroids recommended but patient preferred not to take it September 2016 at [facility]  -Treatment   - deferred steroids sept 2016 visit at [facility] with Dr Carleton  - nintedanib  since March 2020   #Small hiatal hernia with acid reflux disease  10/03/2019 -   Chief Complaint  Patient presents with   Follow-up    SOB and cough with activity has increasd, Productive cough with yellow sputum     HPI [name] 83 y.o. -returns for follow-up with her son.  At this point in time the focus is to establish the true nature of interstitial  lung disease.  She has brought her son with her.  However approximately 2 weeks ago she had acute bronchitis symptoms and called in.  Was given a cephalexin  5 days and prednisone  5 days.  These have helped immensely.  However she feels not back to baseline.  She feels she will benefit from another round of antibiotic with or without prednisone .  She finished ILD questionnaire and it is listed below.  We have not been able to discuss in a multidisciplinary case conference of 4. [facility] Comprehensive ILD Questionnaire  Symptoms: See below.   Past Medical History :  -Denies any collagen vascular disease or vasculitis.  Denies any knowledge of prior pulmonary hypertension.  Denies diabetes or thyroid  disease.  Denies any stroke.  Denies mononucleosis denies tuberculosis denies pneumonia denies blood clots denies heart disease denies pleurisy   ROS: Positive for shoulder pain for the last several years but otherwise no dry eyes.  No Raynaud's.  No weight loss.  No nausea no vomiting no rash   FAMILY HISTORY of LUNG DISEASE: Denies any pulmonary fibrosis or COPD or cystic fibrosis of  hypersensitive pneumonitis or any other lung disease.   EXPOSURE HISTORY: Never smoked cigarettes.  No passive smoking.  No marijuana.  No vaping no cocaine no intravenous drug use.   HOME and HOBBY DETAILS : Current home is 25 years she is lived there for 5 years.  In the [place] setting there is no dampness.  No mold or mildew no feather pillow.  No steam iron use no Jacuzzi use no nebulizer use no humidifier use.  No pet birds or parakeets.  No pet gerbils no pet hamsters no pet rabbits no rodents.  No mold in the [facility] duct no music habits such as wind instruments no gardening.  No flood of water  damage.  No strong mats.  No exposure to animals at work.  History update in April 2023: She reported that between 2010 2014 she lived in the mother's house that was built in the 1950s.  She thought there was mold in  this but never confirmed.  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  She also had a long-term tendency to clean her home heavily with Clorox.  She inhaled these fumes according to the son.  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   PULMONARY TOXICITY HISTORY (27 items): She has done prednisone  burst.  She does not want to do long-term prednisone .  She has been on long-term nintedanib  but tolerating it well.    Testing -2018 echo with elevated pulmonary systolic pressure 55 mmHg  - nov 2019 Last CT - HRCT IMPRESSION: 1. Spectrum of findings compatible with fibrotic interstitial lung disease with extensive air trapping and absence of basilar gradient, strongly favoring chronic hypersensitivity pneumonitis. Findings have slightly progressed since 04/24/2017, with more clear progression since more remote chest CT of 06/24/2014. Findings are suggestive of an alternative diagnosis (not UIP) per consensus guidelines: Diagnosis of Idiopathic Pulmonary Fibrosis: An Official ATS/ERS/JRS/ALAT Clinical Practice Guideline. Am JINNY Honey Crit Care Med Vol 198, Iss 5, [phone], Feb 04 2017. 2. Stable mild mediastinal lymphadenopathy, most compatible with benign reactive adenopathy. 3. One vessel coronary atherosclerosis. 4. Small hiatal hernia.   Aortic Atherosclerosis (ICD10-I70.0).     Results for Merkle, Danylah G (MRN [id number]) as of 07/29/2019 11:43  Ref. Range 07/04/2014 12:58 09/03/2015 11:04 09/27/2016 13:56 05/01/2017 14:05 07/19/2019 15:54  FVC-Pre Latest Units: L 2.13 1.93 1.86 1.71 1.61  FVC-%Pred-Pre Latest Units: % 66 61 59 55 53   Results for FLORIENE, JESCHKE (MRN [id number]) as of 07/29/2019 11:43  Ref.  Range 07/04/2014 12:58 09/03/2015 11:04 09/27/2016 13:56 05/01/2017 14:05 07/19/2019 15:54  DLCO unc Latest Units: ml/min/mmHg 13.70 14.70 12.69 11.23 17.11  DLCO unc % pred Latest Units: % 48 51 44 39 82     ROS  OV 12/03/2019  Subjective:  Patient ID: Joy MATSU CLEASTER, female , DOB: 1940/06/14 , age 54 y.o. 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 Interestingly, there are scattered non-necrotizing granulomata in the pleura.  -Second opinion September 2016 at [facility]: pathology was reviewed at our multidisciplinary ILD conference and noted to have a variegated pattern of fibrosis with some airway centricity, granulomas noted both near the airway and more peripherally. Minimal inflammation was noted. Features not c/w with UIP. Few giant cells with cholesterol clefts. Overall pattern most in keeping with chronic HP   - 3rd opinon: June 2021 [facility] [place] - HP   -High-resolution CT chest including April 09, 2018: Lack of craniocaudal gradient and significant air trapping suggestive of chronic hypersensitivity pneumonitis [September 2016 [facility] evaluation thought CT was inconsistent with UIP]  -Clinical diagnosis of chronic hypersensitivity pneumonitis   - dx [facility] 2016 [facility]  -No clear-cut exposure identified at [facility] evaluation September 2016 by Dr. Hollis  -Steroids recommended but patient preferred not to take it September 2016 at [facility]  -Treatment   - deferred steroids  sept 2016 visit at [facility] with Dr Carleton  - nintedanib  since March 2020   #Small hiatal hernia with acid reflux disease   HPI Santiaga G Seebeck 83 y.o. -presents with her son for follow-up.  She continues to be on 3 L at rest 4 L with exertion.  Her acute bronchitis from April 2021 is resolved.  Her overall symptoms are stable/slightly better after her acute bronchitis.  There is no interim complaints.  In the interim we did discuss her at her case conference.  The consensus diagnosis is that she has hypersensitive pneumonitis.  The only exposure is when she was working in the [employer] at in 1960s.  She and her son again denies any ongoing exposures.  Reviewed her pulmonary function test and it is stable compared to the recent ones.  There is progression over the last few years to several years.  In terms of a high-resolution CT chest is also stable since 2019 through 2021 but progressive when compared to earlier time points.  She was started on nintedanib  in March 2020.  She is tolerating this quite well.  It appears that she and she went on nintedanib  she is stable.  She yet to do pulmonary rehabilitation.  Last done few years ago.  Son feels that she needs to be more mobile She is yet to connect with the support group She did not get referred to cardiology for right heart catheterization. She wants a flutter valve for her associated bronchiectasis seen on CT scan    OV 03/09/2020   Subjective:  Patient ID: [name], female , DOB: 1941/06/04, age 83 y.o. years. , MRN: [id number],  ADDRESS: [address] [place] [address] PCP  Okey Carlin Redbird, MD Providers : Treatment Team:  Attending Provider: Geronimo Amel, MD   Chief Complaint  Patient presents with   Follow-up    Patient wears 4 liters oxygen  with exertion, was prescribed celebrex they want to know if it is ok with OFEV .      #Interstitial lung disease (progressive) with chronic hypoxemic respiratory  failure - chronic HP dx -Per [facility] September 2016: Disease present at least since 2008  - Surgical lung biopsy  November 20, 2014 at [place]:   - local opinion: diagnostic of usual interstitial pneumonia (UIP). Interestingly, there are  scattered non-necrotizing granulomata in the pleura.  -Second opinion September 2016 at [facility]: pathology was reviewed at our multidisciplinary ILD conference and noted to have a variegated pattern of fibrosis with some airway centricity, granulomas noted both near the airway and more peripherally. Minimal inflammation was noted. Features not c/w with UIP. Few giant cells with cholesterol clefts. Overall pattern most in keeping with chronic HP   - 3rd opinon: June 2021 [facility] [place] - Hypersensitivity Pneumonitis   -High-resolution CT chest including April 09, 2018: Lack of craniocaudal gradient and significant air trapping suggestive of chronic hypersensitivity pneumonitis [September 2016 [facility] evaluation thought CT was inconsistent with UIP]  -Clinical diagnosis of chronic hypersensitivity pneumonitis   - dx [facility]  -No clear-cut exposure identified at [facility] evaluation September 2016 by Dr. Hollis  -Steroids recommended but patient preferred not to take it September 2016 at [facility]  -Treatment   - deferred steroids sept 2016 visit at [facility] with Dr Hollis and again at [facility] in June 2021  - nintedanib  sole Rx since March 2020     HPI [name] 83 y.o. -returns for follow-up with her son [name].  Since her last visit in June 2020 when she continues to be stable.  She is enrolled in the pulmonary rehabilitation program which has helped her fatigue and shortness of breath significantly.  She continues on nintedanib  without any adverse side effects.  She wanted to know if it is okay to take cephalexin  for arthralgia.  She has chronic osteoarthritis.  She wants to avoid NSAIDs.  In terms of oxygen   use she is stable at 4 L nasal cannula.  She recently had pulmonary function test I reviewed this and it shows stability and is documented below.  Her weight continues to be stable.  We referred her for right heart catheterization with because of the Covid delta surge she deferred it.  Of note on February 28, 2020 while trying to get into the car out of her primary care physician's office she fell down and sustained abrasions and bruises injuries particularly in her lower extremity.  Since then her right kneecap patella area is red and warm.  There is a small incision injury there but this is healed.  She thinks it slightly better/stable.  She is not having any fever.  This has not been treated.      ROS - per HPI  CT chest high resolution - may 2021  IMPRESSION: 1. Spectrum of findings compatible with fibrotic interstitial lung disease with moderate air trapping and absence of apicobasilar gradient. Findings are most compatible with chronic hypersensitivity pneumonitis. No interval progression since 2019 chest CT. Evidence of progression since baseline 2016 chest CT. Findings are suggestive of an alternative diagnosis (not UIP) per consensus guidelines: Diagnosis of Idiopathic Pulmonary Fibrosis: An Official ATS/ERS/JRS/ALAT Clinical Practice Guideline. Am JINNY Honey Crit Care Med Vol 198, Iss 5, [phone], Feb 04 2017. 2. Stable mild mediastinal lymphadenopathy, compatible with benign reactive adenopathy. 3. Small hiatal hernia. 4. Aortic Atherosclerosis (ICD10-I70.0).     Electronically Signed   By: Selinda DELENA Blue M.D.   On: 10/24/2019 15:02    OV 09/24/2020  Subjective:  Patient ID: [name], female , DOB: March 11, 1941 , age 8 y.o. , MRN: [id number] , ADDRESS: [address] [place] [address] PCP Okey Carlin Redbird, MD Patient Care Team: Okey Carlin Redbird, MD as PCP - General (Family Medicine) Morgan Agent, MD as Referring Physician (Family  Medicine) Kerrin Elspeth BROCKS, MD as Consulting Physician (Cardiothoracic Surgery)  This Provider for this visit: Treatment Team:  Attending Provider: Geronimo Amel, MD    09/24/2020 -   Chief Complaint  Patient presents with   Follow-up    Review PFT results. Still reports cough, sometimes productive with clear phlegm.      #Interstitial lung disease (progressive) with chronic hypoxemic respiratory failure - chronic HP dx -Per [facility] September 2016: Disease present at least since 2008  - Surgical lung biopsy  November 20, 2014 at [place]:   - local opinion: diagnostic of usual interstitial pneumonia (UIP). Interestingly, there are scattered non-necrotizing granulomata in the pleura.  -Second opinion September 2016 at [facility]: pathology was reviewed at our multidisciplinary ILD conference and noted to have a variegated pattern of fibrosis with some airway centricity, granulomas noted both near the airway and more peripherally. Minimal inflammation was noted. Features not c/w with UIP. Few giant cells with cholesterol clefts. Overall pattern most in keeping with chronic HP   - 3rd opinon: June 2021 [facility] [place] - Hypersensitivity Pneumonitis   -High-resolution CT chest including April 09, 2018: Lack of craniocaudal gradient and significant air trapping suggestive of chronic hypersensitivity pneumonitis [September 2016 [facility] evaluation thought CT was inconsistent with UIP]  - May 2021: stability since 2019 but progression sice 2016  -Clinical diagnosis of chronic hypersensitivity pneumonitis   - dx [facility]  -No clear-cut exposure identified at [facility] evaluation September 2016 by Dr. Hollis  -Steroids recommended but patient preferred not to take it September 2016 at [facility]   -Treatment   - deferred steroids sept 2016 visit at [facility] with Dr Hollis and again at [facility] in June 2021, Apr 2022  - nintedanib  sole Rx since March  2020       HPI [name] 83 y.o. -presents for follow-up.  Last seen in the fall 2021.  After that overall she is been stable but last month she picked up a respiratory infection got antibiotic and prednisone  and then she is better.  Nevertheless she thinks her symptoms may be a little worse than baseline.  Still she says she gets by with 3-4 L of oxygen  which is her baseline.  Subjective symptom score overall has not changed.  She had pulmonary function test today and this shows a decline it is all documented below.  I visualized this.  Her last CT scan of the chest was in May 2021.  1 year anniversary of the CT scan is coming up.  Over the winter she declined to have right heart catheterization because of COVID-19.  But at this point in time with the improvement in the pandemic prevalence rate in the local community of COVID she is willing to have a right heart catheterization.  She is tolerating nintedanib  just fine without any problems.  A walking desaturation test on room air is as below      OV 10/27/2020  Subjective:  Patient ID: [name], female , DOB: September 11, 1940 , age 15 y.o. , MRN: [id number] , ADDRESS: [address] [place] [address] PCP Okey Carlin Redbird, MD Patient Care Team: Okey Carlin Redbird, MD as PCP - General (Family Medicine) Rolan Ezra RAMAN, MD as PCP - Advanced Heart Failure (Cardiology) Morgan Agent, MD as Referring Physician (Family Medicine) Kerrin Elspeth BROCKS, MD as Consulting Physician (Cardiothoracic Surgery)  This Provider for this visit: Treatment Team:  Attending Provider: Geronimo Amel, MD   #  Interstitial lung disease (progressive) with chronic hypoxemic respiratory failure - chronic HP dx -Per [facility] September 2016: Disease present at least since 2008  - Surgical lung biopsy  November 20, 2014 at :   - local opinion: diagnostic of usual interstitial pneumonia (UIP). Interestingly, there  are scattered non-necrotizing granulomata in the pleura.  -[facility] pathology was reviewed at our multidisciplinary ILD conference and noted to have a variegated pattern of fibrosis with some airway centricity, granulomas noted both near the airway and more peripherally. Minimal inflammation was noted. Features not c/w with UIP. Few giant cells with cholesterol clefts. Overall pattern most in keeping with chronic HP   - 3rd opinon: June 2021 [facility] [place] - Hypersensitivity Pneumonitis   -High-resolution CT chest including April 09, 2018: Lack of craniocaudal gradient and significant air trapping suggestive of chronic hypersensitivity pneumonitis [September 2016 [facility] evaluation thought CT was inconsistent with UIP]  - May 2021: stability since 2019 but progression sice 2016  -Clinical diagnosis of chronic hypersensitivity pneumonitis   - [facility]  -No clear-cut exposure identified at [facility] evaluation September 2016 by Dr. Hollis  -Steroids recommended but patient preferred not to take it September 2016 at [facility]   -Treatment   - deferred steroids sept 2016 visit at [facility] with Dr Hollis and again at [facility] in June 2021, Apr 2022  - nintedanib  sole Rx since March 2020    10/27/2020 -  TELEPHONE CALL  Type of visit: Telephone/Video Circumstance: COVID-19 national emergency Identification of patient DALEAH COULSON with 1940-07-25 and MRN [id number] - 2 person identifier Risks: Risks, benefits, limitations of telephone visit explained. Patient understood and verbalized agreement to proceed Anyone else on call:  Patient location: [phone] This provider location: [address] [facility], [place], [place]   HPI Janika SHARONDA LLAMAS 83 y.o. -this telephone visit is for a few reasons  1)  grandson getting married in July in [place] and [facility] is elevated. Wants to go. Son Denies thiniong of  taking her to [facility]. It is 4000 feet + /1300 meters. Advised them [place] safe[place] from [place]  2) discussed heart catheterization results which are essentially normal.  3) discussed high-resolution CT chest because of concern of progression but in the last 1 year her ILD is stable on nintedanib .  She is happy about this  4) discuss future therapeutic options: At this point in time she is maxed out on therapy.  Not interested in prednisone .  She is on antifibrotic nintedanib .  We discussed the pulse inhaled nitric oxide  study phase 3.  She is very interested in this.  We discussed about the device and also convenience and buying her oxygen  backpack called [employer].  I believe she has a copy of the consent for the study.  We discussed the principles of this.  She is very interested.  She wants to hear from the research coordinator.  I have sent the research coordinator Monita Cera message.  Advised may be a few weeks.   Heart Cath 10/14/20  1. Minimally elevated PA pressure with PVR only 2.1.  2. Normal filling pressures.  3. No significant coronary disease.  [facility] Procedural Findings: Hemodynamics (mmHg) RA mean 4 RV 35/3 PA 36/8,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 longer on Fasenra  and she has not seen any bronchitis episodes of wheezing.        OV 12/05/2023  Subjective:  Patient ID: [name], female , DOB: Jun 30, 1940 , age 24 y.o. , MRN: [id number] , AD[address]ESS: [address] [place] [id number] PCP Okey Carlin Redbird, MD Patient Care Team: Okey Carlin Redbird, MD as PCP - General (Family Medicine) Rolan Ezra RAMAN, MD as PCP - Advanced Heart Failure (Cardiology) Morgan Agent, MD as Referring Physician (Family Medicine) Kerrin Elspeth BROCKS, MD as Consulting Physician (Cardiothoracic Surgery)  This Provider for this visit: Treatment Team:  Attending Provider: Geronimo Amel, MD  12/05/2023 -   Chief Complaint  Patient presents with   Follow-up    Breathing is unchanged.      HPI Dunia G Thede 83 y.o. -presents for follow-up.  Nearly a month ago she got admitted to the hospital for acute on chronic hypoxemic respiratory failure.  Diagnosed with pneumonia not otherwise specified.  CT angiogram chest showed increased consolidation.  Since then she is returned to baseline she says.  But she is on 5 L oxygen  at rest [previous baseline was 3-4 L.  Currently saturating 92%.  She is physically deconditioned and weak but she says she is getting better physical therapy is working with her.  She gets around with a walker she is able to do groceries.  During the hospital she was diagnosed with new onset atrial fibrillation and is on Cardizem  and Eliquis .  She wanted me to stop this but I told her that she should discuss with cardiology.  She continues on nintedanib .  Recent labs in the hospital  was at his factoryI think were satisfactory..  These were reviewed.  November 13, 2023 creatinine 0.35 mg percent, hemoglobin 12.7 and on November 09, 2023 liver function test was normal.  All along her BNP has been normal but in the hospital it went up but currently she is euvolemic.  She did have an echocardiogram and  NO PAH   Last Weight  Most recent update: 12/05/2023  4:09 PM    Weight  74.8 kg (165 lb)               OV 02/13/2024  Subjective:  Patient ID: [name], female , DOB: [birth year] , age 49 y.o. , MRN: [id number] , AD[address]ESS: [address] [place] [id number] PCP Okey Carlin Redbird, MD Patient Care Team: Okey Carlin Redbird, MD as PCP - General (Family Medicine) Rolan Ezra RAMAN, MD as PCP - Advanced Heart Failure (Cardiology) Morgan Agent, MD as Referring Physician (Family Medicine) Kerrin Elspeth BROCKS, MD as Consulting Physician (Cardiothoracic Surgery)  This Provider for this visit: Treatment Team:  Attending Provider: Geronimo Amel, MD    02/13/2024 -   Chief Complaint  Patient presents with   Follow-up    ILD and PFT f/u Pt states she loves OFEV .       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 Interestingly, there are scattered non-necrotizing granulomata in the pleura.  -Second opinion September 2016 at [facility]: pathology was reviewed at our multidisciplinary ILD conference and noted to have a variegated pattern of fibrosis with some airway centricity, granulomas noted both near the airway and more peripherally. Minimal inflammation was noted. Features not c/w with UIP. Few giant cells with cholesterol clefts. Overall pattern most in keeping with chronic HP   - 3rd opinon: June 2021  [facility] [place] - Hypersensitivity Pneumonitis   -High-resolution CT chest including April 09, 2018: Lack of craniocaudal gradient and significant air trapping suggestive of chronic hypersensitivity pneumonitis [September 2016 [facility] evaluation thought CT was inconsistent with UIP]  - May 2021: stability since 2019 but progression sice 2016  - LAST CT Jan 2023  -Clinical diagnosis of chronic hypersensitivity pneumonitis   - dx given Sept 2016 at [facility]  -No clear-cut exposure identified at [facility] evaluation September 2016 by Dr. Hollis  -Steroids recommended but patient preferred not to take it September 2016 at [facility]   -Positive RAST allergy  panel  (grass, dustmike) and normal IgE but elevated  blood eosinophilia in April 2023  - 500cell April 2023  - 400ccells Sept 2023   - Started Symbicort  June 2023  -Started Fasenra  January 2024. -> stopped aug 2024 due to arthraliga   -Normal right heart catheterization May 2022  -LAST ECHO Echo January 2024 with evidence of pulmonary hypertension: -  Patient held off on right heart catheterization early 2024.  -Unintentional weight loss noted in 2023  = Presumed secondary to nintedanib   #June 2025 - admit for pneumonia  -Treatment   - deferred steroids sept 2016 visit at [facility] with Dr Hollis and again at [facility] in June 2021, Apr 2022  - nintedanib  sole Rx since March 2020  -Inhaled nitric oxide  versus placebo research protocol: Withdrawal consent and follow-up March 2023  (study results launche   #Nintedanib /Ofev  requires intensive drug monitoring due to high concerns for Adverse effects of , including  Drug Induced Liver Injury, significant GI side effects that include but not limited to Diarrhea, Nausea, Vomiting,  and other system side effects that include Fatigue,  weight loss. Cardiac side effects are a black box warning as well. These will be monitored with  blood work such as LFT initially once a month for 6 months and  then quarterly  HPI [name] 83 y.o. -returns for follow-up.  I saw her in July 2025.  That was in the heels of an admission in June 2025 for pneumonia.  Since then [facility] status: No new complaints No new medical problems. No new surgeries. No ER visits. No Urgent care visits.  The only change is that she stopped her Cardizem  which was started for atrial fibrillation.  This because she was having falls and fatigue.  Since then no more falls and fatigue has improved.  She is frustrated that her cardiology appointment is not till October 2025 and she feels this is too long after hospital discharge.  In any event her respiratory status is improved she is on 4 L nasal cannula at rest [previously 5].  She had pulmonary function test today the FVC is improved but the DLCO is down and it is conflicting signal.  She is reluctant to have a right heart cath so  on an average I would say given her clinical stability and improvement in oxygen  needs back to 4 L and her exercise hypoxemia test and her symptoms all being stable overall I would say if she is stable with her ILD.  She is taking nintedanib  and is loving it].  She still feels frail and deconditioned.  She is finished home physical therapy she has upcoming appointment with primary care and is going to see if she can do more physical therapy.  She is troubled by neck and chronic shoulder pain and is looking for some steroid injections there.  Otherwise she is stable.  Son [name] is an independent historian and he had just the same.   Last Weight  Most recent update: 02/13/2024  3:54 PM    Weight  73.5 kg (162 lb)               SYMPTOM SCALE - ILD 07/29/2019  10/03/2019  12/03/2019 217# 03/09/2020  09/24/2020 207# 08/25/2021 - withdrew from i[facility] sutdy Weight 201# 09/28/2021 - 202#  11/25/2021 - 193#  02/22/2022 194# 05/17/2022  01/12/2023  04/11/2023  12/05/2023 165# 02/13/2024  162#    O2 use 3L at rest, occ 4L with exertion 3L at  rest and 4L with exertion. Acute bronchitis 2 weeks ago 3L rest, 4 L exertion 3-4L [place] ofev . Doing rehab 3/4L [place] pulse . On ofev  3L rest, 4L with eretiion. Weight 201# Treated oxygen  with rest and 4 L exertion and pulse 3.5L rest, 4-5L exertion 4L Max  4L 4 L at rest and 5 L with heavy exertion. Ofev , fassnra 4 L at rest 5 L a heavy exertion.  On nintedanib .  Off Fasenra .  Dealing with chronic pain. 5 L at rest posthospitalization for pneumonia 4L rest  ofev   Shortness of Breath 0 -> 5 scale with 5 being worst (score 6 If unable to do)               At rest 0 1 0 0 0 1 0 1 1 0  1  0 0  Simple tasks - showers, clothes change, eating, shaving 1 2 2 2 1 3 2 2 3 1 1  3.54 1 2  Household (dishes, doing bed, laundry) 2 3 2 2  2.5 3 4 3 4 3 2 4 2 3   Shopping 2 2 2 2  2.5 2 3 2 3 2 2  3.5 2 2   Walking level at own pace 1 2 2 2 2 2 3 3 3 3 1 4 2 2   Walking up Stairs 6 4 4 6 5 6 6 5 6 5 5  x 6 4  Total (30-36) Dyspnea Score 12 14 12 14 13 17 18 16 20 14 12 15 14 13   How bad is your cough? 1 3.5  More often lateley 2 3 4 2 3 3 1  - fasneral 2.5 1 2   How bad is your fatigue 0 3.5 2 Better with exercise 2.5 3 4 3 3 3 1  3.5 2 3   How bad is nausea 0 0 0 0 0 0 0 0 0 0  0 0 0 0  How bad is vomiting?  0 0 0 0 0 00 0 0 0 0  0 0 0 0  How bad is diarrhea? 0 0 0 0 0 0 0 0 0 0  0 0 0 0  How bad is anxiety? 1 1.5 1 1 1  00 1 1 2 1  0 1 2 3.5  How bad is depression 1  1 1 1  0 0 1 1 2 1 00 1 1 1         SIT STAND TEST - goal 15 times   02/13/2024    O2 used 4L   PRobe - finter or forehead finger   Number sit and stand completed - goal 15 15   Time taken to complete 54 sec   Resting Pulse Ox/HR/Dyspnea  95% and 77/min and dyspnea of 0/10    Peak measures 93 % and 99/min and dyspnea of 6/10   Final Pulse Ox/HR 94% and 95% nad 6/10   Desaturated </= 88% no   Desaturated <= 3% points no   Got Tachycardic >/= 90/min yes   Miscellaneous comments Dyspnea of 6      CT Chest data from date: June 2025  - personally  visualized and independently interpreted : no - my findings are: as below  IMPRESSION: 1. Multilobar patchy consolidation, indicative of pneumonia. 2. Assessment of known underlying interstitial lung disease is challenging in the setting. 3. Mediastinal adenopathy is likely reactive in etiology. 4. Trace left pleural effusion. 5. 2.1 cm right thyroid  nodule. Given advanced patient age and comorbidities, ultrasound is at the discretion of the referring provider. (Ref: J Am Coll Radiol. 2015 Feb;12(2): 143-50). 6. Aortic atherosclerosis (ICD10-I70.0). Coronary artery calcification. 7. Enlarged pulmonic trunk, indicative of pulmonary arterial hypertension.     Electronically Signed   By: Newell Eke M.D.   On: 11/10/2023 12:41  PFT     Latest Ref Rng & Units 02/13/2024    3:02 PM 08/10/2023    2:02 PM 09/23/2022   12:14 PM 11/25/2021    3:10 PM 09/24/2020    2:56 PM 12/03/2019   11:39 AM 12/03/2019    9:57 AM  PFT Results  FVC-Pre L 1.39  P 1.23  1.46  1.58  1.52  1.70  1.66   FVC-Predicted Pre % 53  P 46  52  54  50  57  55   Pre FEV1/FVC % % 75  P 80  82  79  80  81  82   FEV1-Pre L 1.04  P 0.98  1.20  1.25  1.21  1.39  1.36   FEV1-Predicted Pre % 54  P 50  57  57  54  62  60   DLCO uncorrected ml/min/mmHg 8.84  P 14.22  11.24  11.86  5.95  15.25  13.20   DLCO UNC% % 45  P 73  56  57  28  73  63   DLCO corrected ml/min/mmHg  14.22  11.24  11.97  5.95  15.25  13.20   DLCO COR %Predicted %  73  56  58  28  73  63   DLVA Predicted % 78  P 99  88  94  67  114  103     P Preliminary result       LAB RESULTS last 96 hours No results found.       has a past medical history of Acute on chronic respiratory failure with hypoxia (HCC) (06/13/2016), Allergic rhinitis, Anxiety, Arthritis, Asthma, Carpal tunnel syndrome, CHF (congestive heart failure) (HCC), GERD (gastroesophageal reflux disease), History of bronchitis (3-26yrs ago), History of colon polyps, Joint pain,  Peripheral edema, Pneumonitis, hypersensitivity (HCC) (05/2016), Pulmonary hypertension (HCC) (06/30/2017), Shortness of breath dyspnea, and Thyroid  nodule.   reports that she has never smoked. She has never used smokeless tobacco.  Past Surgical History:  Procedure Laterality  Date   BREAST CYST EXCISION Left 50 yrs ago   CARPELL TUNNELL SURG Right    COLONOSCOPY     ESOPHAGOGASTRODUODENOSCOPY     EYE SURGERY     bilateral cataract surgery with lens implants   KNEE ARTHROSCOPY W/ MENISCAL REPAIR     left   LUNG BIOPSY Right 11/20/2014   Procedure: LUNG BIOPSY;  Surgeon: Elspeth JAYSON Millers, MD;  Location: [facility] OR;  Service: Thoracic;  Laterality: Right;   REVERSE SHOULDER ARTHROPLASTY Right 06/07/2018   Procedure: REVERSE SHOULDER ARTHROPLASTY;  Surgeon: Melita Drivers, MD;  Location: [facility] ORS;  Service: Orthopedics;  Laterality: Right;    RIGHT/LEFT HEART CATH AND CORONARY ANGIOGRAPHY N/A 10/14/2020   Procedure: RIGHT/LEFT HEART CATH AND CORONARY ANGIOGRAPHY;  Surgeon: Rolan Ezra RAMAN, MD;  Location: [facility] INVASIVE CV LAB;  Service: Cardiovascular;  Laterality: N/A;   TOTAL KNEE ARTHROPLASTY Left 08/13/2013   Procedure: LEFT TOTAL KNEE ARTHROPLASTY;  Surgeon: Donnice JONETTA Car, MD;  Location: [facility] ORS;  Service: Orthopedics;  Laterality: Left;   UMBILICAL HERNIA REPAIR     VIDEO ASSISTED THORACOSCOPY Right 11/20/2014   Procedure: RIGHT  VIDEO ASSISTED THORACOSCOPY WITH WEDGE LUNG BIOPSIES RIGHT UPPER,MIDDLE AND LOWER LOBES ,PLACEMENT OF ON Q PAIN PUMP;  Surgeon: Elspeth JAYSON Millers, MD;  Location: [facility] OR;  Service: Thoracic;  Laterality: Right;    Allergies  Allergen Reactions   Adhesive [Tape] Itching and Rash   Latex Itching and Swelling   Doxycycline  Rash   Sulfonamide Derivatives Rash and Other (See Comments)    Immunization History  Administered Date(s) Administered   Fluad Quad(high Dose 65+) 02/07/2019, 05/06/2021, 02/22/2022   Fluzone  Influenza virus vaccine,trivalent (IIV3),  split virus 02/28/2012, 03/02/2015   INFLUENZA, HIGH DOSE SEASONAL PF 04/20/2016, 02/23/2017, 02/12/2018   Influenza Split 04/20/2016, 02/12/2018   Influenza,inj,quad, With Preservative 04/14/2014   Influenza-Unspecified 03/06/2014, 02/18/2015   PFIZER(Purple Top)SARS-COV-2 Vaccination 07/12/2019, 08/06/2019, 04/12/2020   Pneumococcal Conjugate-13 04/14/2014   Pneumococcal Polysaccharide-23 06/14/2017   Tdap 06/14/2017   Zoster, Live 04/24/2013    Family History  Problem Relation Age of Onset   Tuberculosis Father    Tuberculosis Paternal Uncle      Current Outpatient Medications:    fluticasone  (FLONASE ) 50 MCG/ACT nasal spray, Place 1 spray into both nostrils daily as needed for allergies. , Disp: , Rfl:    furosemide  (LASIX ) 20 MG tablet, Take 20 mg by mouth daily as needed for edema., Disp: , Rfl:    loratadine  (CLARITIN ) 5 MG chewable tablet, Chew 5-10 mg by mouth daily as needed for allergies. , Disp: , Rfl:    Multiple Vitamin (MULTIVITAMIN WITH MINERALS) TABS tablet, Take 1 tablet by mouth daily., Disp: , Rfl:    Nintedanib  (OFEV ) 150 MG CAPS, Take 1 capsule (150 mg total) by mouth 2 (two) times daily with a meal., Disp: 180 capsule, Rfl: 1   sodium chloride  HYPERTONIC 3 % nebulizer solution, Take by nebulization in the morning and at bedtime. Dx J47.1, Disp: 750 mL, Rfl: 1   VENTOLIN  HFA 108 (90 Base) MCG/ACT inhaler, Inhale 2 puffs into the lungs every 6 (six) hours as needed for wheezing or shortness of breath., Disp: 18 g, Rfl: 6   vitamin B-12 (CYANOCOBALAMIN) 1000 MCG tablet, Take 1,000 mcg by mouth every other day., Disp: , Rfl:    apixaban  (ELIQUIS ) 5 MG TABS tablet, Take 1 tablet (5 mg total) by mouth 2 (two) times daily. (Patient not taking: Reported on 02/13/2024), Disp: 60 tablet, Rfl: 1  Cholecalciferol (DIALYVITE VITAMIN D 5000) 125 MCG (5000 UT) capsule, Take 5,000 Units by mouth daily. (Patient not taking: Reported on 02/13/2024), Disp: , Rfl:    diltiazem  (CARDIZEM   CD) 120 MG 24 hr capsule, Take 1 capsule (120 mg total) by mouth daily. (Patient not taking: Reported on 02/13/2024), Disp: 30 capsule, Rfl: 1   guaiFENesin  (ROBITUSSIN) 100 MG/5ML liquid, Take 5 mLs by mouth every 4 (four) hours as needed for cough or to loosen phlegm. (Patient not taking: Reported on 02/13/2024), Disp: 118 mL, Rfl: 0   linezolid  (ZYVOX ) 600 MG tablet, Take 1 tablet (600 mg total) by mouth 2 (two) times daily. (Patient not taking: Reported on 02/13/2024), Disp: 10 tablet, Rfl: 0   XARELTO 20 MG TABS tablet, Take 20 mg by mouth daily. (Patient not taking: Reported on 02/13/2024), Disp: , Rfl:       Objective:   Vitals:   02/13/24 1553  BP: (!) 149/83  Pulse: 77  Temp: 98.2 F (36.8 C)  SpO2: 93%  Weight: 162 lb (73.5 kg)  Height: 5' 5.25 (1.657 m)    Estimated body mass index is 26.75 kg/m as calculated from the following:   Height as of this encounter: 5' 5.25 (1.657 m).   Weight as of this encounter: 162 lb (73.5 kg).  @WEIGHTCHANGE @  [employer]   02/13/24 1553  Weight: 162 lb (73.5 kg)     Physical Exam   General: No distress. Looks lean but beter O2 at rest: yes Cane present: yes Sitting in wheel chair: no Frail: yes Obese: no Neuro: Alert and Oriented x 3. GCS 15. Speech normal Psych: Pleasant Resp:  Barrel Chest - no.  Wheeze - no, Crackles - yes, No overt respiratory distress CVS: Normal heart sounds. Murmurs - no Ext: Stigmata of Connective Tissue Disease - no HEENT: Normal upper airway. PEERL +. No post nasal drip        Assessment/     Assessment & Plan ILD (interstitial lung disease) (HCC)  Encounter for therapeutic drug monitoring  History of pneumonia  Physical deconditioning  Frequent falls    PLAN Patient Instructions  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 CHronic resp failure with hypoxemia Therapeutic drug monitoring   - though pregressive disease over time  stabilized now at f4L [place] for some  time  - PFT is mixed picture but on average is stable = I think ofev  is helping you - And glad you are better _   Plan  -  continue ofev   - continue o2 4-5 LNC at rest and more with exertion - goal pulse ox > 88% - LFT 02/13/2024 or with PCP next week - do  spiro/dlco in 5 months   History Pneumonia June 2025   - clinically well  Plan  - followup CXR 02/13/2024    History of recurrent bronchitis with acute bronchitis /asthma flare up - 02/22/2022, Oct 2023, Nov 2023, Dec 2023 History of eosinophilia 2018 and 2023 with postiive RAST panel e-asthma , allergic asthma with flare up  Now off fasenral since summer 2024 and no recurrence Arthralgia likely due to another cause (not fasenra )  Plan  - Continue inhaler therapy with Flonase , hypertonic saline and albuterol  as needed  Falls Physical Deconditining  Stopped afer stopping cardizem  Now stronger after June 2025 admit  Plan  - talk to PCP Okey Carlin Redbird, MD about getting more PT  Followup  - Shelle Galdamez visit - 30 min in 5 months but after  spirometry  - symptoms score and walk test at followup    FOLLOWUP    Return in about 5 months (around 07/15/2024) for 30 min visit, after Spiro and DLCO, ILD, with Dr Geronimo.  ( Level 05 visit E&M 2024: Estb >= 40 min visit type: on-site physical face to visit  in total care time and counseling or/and coordination of care by this undersigned MD - Dr Dorethia Geronimo. This includes one or more of the following on this same day 02/13/2024: pre-charting, chart review, note writing, documentation discussion of test results, diagnostic or treatment recommendations, prognosis, risks and benefits of management options, instructions, education, compliance or risk-factor reduction. It excludes time spent by the CMA or office staff in the care of the patient. Actual time 40+ min)   SIGNATURE    Dr. Dorethia Geronimo, M.D., F.C.C.P,  Pulmonary and Critical Care Medicine Staff Physician,  [facility] Center Director - Interstitial Lung Disease  Program  Pulmonary Fibrosis [facility] at [facility] [place], [place], [address]  Pager: [phone], If no answer or between  15:00h - 7:00h: call [phone] Telephone: [phone]  4:54 PM 02/13/2024

## 2024-02-13 NOTE — Patient Instructions (Signed)
Spiro/DLCO performed today. 

## 2024-02-13 NOTE — Progress Notes (Signed)
 Just ild. No pneumonia. Good news

## 2024-02-21 DIAGNOSIS — R899 Unspecified abnormal finding in specimens from other organs, systems and tissues: Secondary | ICD-10-CM | POA: Diagnosis not present

## 2024-02-21 DIAGNOSIS — J8489 Other specified interstitial pulmonary diseases: Secondary | ICD-10-CM | POA: Diagnosis not present

## 2024-02-21 DIAGNOSIS — M542 Cervicalgia: Secondary | ICD-10-CM | POA: Diagnosis not present

## 2024-02-21 DIAGNOSIS — J8410 Pulmonary fibrosis, unspecified: Secondary | ICD-10-CM | POA: Diagnosis not present

## 2024-02-21 DIAGNOSIS — Z6825 Body mass index (BMI) 25.0-25.9, adult: Secondary | ICD-10-CM | POA: Diagnosis not present

## 2024-02-21 DIAGNOSIS — Z23 Encounter for immunization: Secondary | ICD-10-CM | POA: Diagnosis not present

## 2024-02-21 DIAGNOSIS — Z09 Encounter for follow-up examination after completed treatment for conditions other than malignant neoplasm: Secondary | ICD-10-CM | POA: Diagnosis not present

## 2024-02-21 DIAGNOSIS — M1990 Unspecified osteoarthritis, unspecified site: Secondary | ICD-10-CM | POA: Diagnosis not present

## 2024-02-22 LAB — LAB REPORT - SCANNED: EGFR: 90

## 2024-02-26 DIAGNOSIS — M19012 Primary osteoarthritis, left shoulder: Secondary | ICD-10-CM | POA: Diagnosis not present

## 2024-03-08 ENCOUNTER — Encounter (HOSPITAL_BASED_OUTPATIENT_CLINIC_OR_DEPARTMENT_OTHER): Payer: Self-pay | Admitting: Cardiology

## 2024-03-08 ENCOUNTER — Ambulatory Visit (HOSPITAL_BASED_OUTPATIENT_CLINIC_OR_DEPARTMENT_OTHER): Admitting: Cardiology

## 2024-03-08 VITALS — BP 138/74 | HR 77 | Ht 66.5 in | Wt 159.5 lb

## 2024-03-08 DIAGNOSIS — I480 Paroxysmal atrial fibrillation: Secondary | ICD-10-CM | POA: Diagnosis not present

## 2024-03-08 DIAGNOSIS — J9611 Chronic respiratory failure with hypoxia: Secondary | ICD-10-CM

## 2024-03-08 DIAGNOSIS — Z7901 Long term (current) use of anticoagulants: Secondary | ICD-10-CM

## 2024-03-08 DIAGNOSIS — D6869 Other thrombophilia: Secondary | ICD-10-CM

## 2024-03-08 DIAGNOSIS — I272 Pulmonary hypertension, unspecified: Secondary | ICD-10-CM

## 2024-03-08 DIAGNOSIS — R011 Cardiac murmur, unspecified: Secondary | ICD-10-CM | POA: Diagnosis not present

## 2024-03-08 DIAGNOSIS — J849 Interstitial pulmonary disease, unspecified: Secondary | ICD-10-CM | POA: Diagnosis not present

## 2024-03-08 MED ORDER — DILTIAZEM HCL ER COATED BEADS 120 MG PO CP24
120.0000 mg | ORAL_CAPSULE | Freq: Every day | ORAL | 3 refills | Status: AC
Start: 2024-03-08 — End: ?

## 2024-03-08 NOTE — Progress Notes (Signed)
 Cardiology Office Note:  .   Date:  03/08/2024  ID:  DACOTA DEVALL, DOB 03/25/1941, MRN [id number] PCP: Okey Carlin Redbird, MD  [facility] Providers Cardiologist:  Shelda Bruckner, MD Advanced Heart Failure:  Ezra Shuck, MD {  History of Present Illness: .   [name] is a 83 y.o. female with PMH severe pulmonary fibrosis, chronic respiratory failure on home O2, new paroxysmal atrial fibrillation 11/2023. She saw Dr. Lavona in 2012 and then was seen by Dr. Cindie in EP during recent hospitalization in 11/2023. She is referred as a new patient evaluation at the request of Dr. Okey for atrial fibrillation.   Referral from 12/14/23 from Dr. Okey reviewed. Referred for history of pulmonary fibrosis, had recent afib during hospitalization in 11/2023. Notes reviewed. Presented with acute on chronic respiratory failure, bacterial pneumonia, strep bacteremia, rhinovirus, enterovirus. Treated for afib RVR. Started on diltiazem  and DOAC. Not felt to be a candidate for amiodarone given lung disease and not felt to be a candidate for invasive EP procedures per Dr. Hiram notes. Echo showed EF 70-75%, G1DD, RV mildly enlarged/function mildly reduced. RAP 3. No severe valve disease. RVSP 48 mmHg.  She is here today with her son [name]. She does not feel that she has any heart issues, doesn't recall much about her hospitalization. She was not sure why she was on Xarelto or diltiazem . We discussed afib at length, see below.  She had some hematuria post discharge but has not seen any melena, hematochezia, or hematuria since then. Does bruise easily. Has not felt any atrial fibrillation or palpitations. Has chronic very mild LE edema, unchanged. Feels that her shortness of breath is stable to mildly worse but she feels this is due to decrease in her activity level.  Had dizzy spells about 3 mos ago, had a fall. Has not recurred.   ROS positive for neck/joint pain.   ROS:  Denies chest pain. No PND, orthopnea, change in LE edema or unexpected weight gain. No syncope or palpitations. ROS otherwise negative except as noted.   Studies Reviewed: SABRA    EKG:       Physical Exam:   VS:  BP 138/74   Pulse 77   Ht 5' 6.5 (1.689 m)   Wt 159 lb 8 oz (72.3 kg)   SpO2 96%   BMI 25.36 kg/m    Wt Readings from Last 3 Encounters:  03/08/24 159 lb 8 oz (72.3 kg)  02/13/24 162 lb (73.5 kg)  12/05/23 165 lb (74.8 kg)    GEN: In no distress, on chronic home O1 by nasal cannula HEENT: Normal, moist mucous membranes NECK: No JVD CARDIAC: regular rhythm, normal S1 and S2, no rubs or gallops. 2-3/6 systolic murmur. VASCULAR: Radial and DP pulses 2+ bilaterally. No carotid bruits RESPIRATORY:  Clear to auscultation without rales, wheezing or rhonchi  ABDOMEN: Soft, non-tender, non-distended MUSCULOSKELETAL:  Ambulates independently SKIN: Warm and dry, trivial bilateral LE edema NEUROLOGIC:  Alert and oriented x 3. No focal neuro deficits noted. PSYCHIATRIC:  Normal affect    ASSESSMENT AND PLAN: .    Paroxysmal atrial fibrillation Secondary hypercoagulable state -CHA2DS2/VAS Stroke Risk Points= 6 -Afib is abnormal rhythm of the top chamber of the heart. It can be triggered by many different things, including stress, infection, surgery, age, etc. The rhythm itself, when not fast, is not life threatening. We make sure the heart rate is not fast, and we talk about the risk of small blood clots forming  in the heart. When afib lasts for a significant period of time, there is a risk of small clots forming and breaking loose, causing a stroke. We prevent this with blood thinning medication, but these medications also increase risk for bleeding. We discuss the balance between bleeding and stroke with each individual patient.  -no major bleeding, she is willing to continue Xarelto -we discussed keeping vs. Stopping diltiazem , sign/symptoms to watch for. Will continue for now but  can stop if she has drop in blood pressure -reviewed checking heart rates on pulse ox  Murmur -likely TR based on location, though some radiation to mitral area as well  Chronic hypoxic respiratory failure on home O2 ILD Pulmonary hypertension -reviewed echo -followed closely in pulmonology  Dispo: 6 mos or sooner as needed  Signed, Shelda Bruckner, MD   Shelda Bruckner, MD, PhD, [facility] [place]  [facility]  [facility] [address], [address] [place], [place] [address] [phone]

## 2024-03-08 NOTE — Patient Instructions (Signed)
 Medication Instructions:  No changes *If you need a refill on your cardiac medications before your next appointment, please call your pharmacy*   Follow-Up: At [facility], you and your health needs are our priority.  As part of our continuing mission to provide you with exceptional heart care, our providers are all part of one team.  This team includes your primary Cardiologist (physician) and Advanced Practice Providers or APPs (Physician Assistants and Nurse Practitioners) who all work together to provide you with the care you need, when you need it.  Your next appointment:   6 month(s)  Provider:   Rosaline Bane, NP or Reche Finder, NP

## 2024-04-04 DIAGNOSIS — M7918 Myalgia, other site: Secondary | ICD-10-CM | POA: Diagnosis not present

## 2024-04-22 DIAGNOSIS — J209 Acute bronchitis, unspecified: Secondary | ICD-10-CM | POA: Diagnosis not present

## 2024-04-22 DIAGNOSIS — R0602 Shortness of breath: Secondary | ICD-10-CM | POA: Diagnosis not present

## 2024-06-24 ENCOUNTER — Telehealth: Payer: Self-pay | Admitting: *Deleted

## 2024-06-24 NOTE — Telephone Encounter (Signed)
 Copied from CRM [id number]. Topic: Appointments - Scheduling Inquiry for Clinic >> Jun 21, 2024  1:26 PM Leila BROCKS wrote: Reason for CRM: Patient [phone] needs to schedule with Dr. Geronimo. Patient last seen 02/13/24 and was advised to follow up with Dr. Geronimo visit - 30 min in 5 months but after spirometry - symptoms score and walk test at follow up; Unable to reach CAL for clarification. Informed patient, the next available is 10/08/24 with Dr. Geronimo. Patient states she cannot wait that long and needs Nintedanib  (OFEV ) 150 MG CAPS refill, only has 3 days supply left, there's a month refill left at [facility] specialty pharmacy, but needs the patient assistance program is needed. Patient cannot pay out of pocket, it's too expensive. Patient contacted [facility] specialty pharmacy a week ago. Please advise and call back.  ----------------------------------------------------------------------------------------------------------------------------------------  We can schedule her to see Almarie Ferrari NP on 2/17.

## 2024-06-26 NOTE — Telephone Encounter (Signed)
 Called the pt and there was no answer- LMTCB and will route to admin pool for f/u on scheduling appt.

## 2024-06-27 NOTE — Telephone Encounter (Signed)
 Pt scheduled

## 2024-06-28 ENCOUNTER — Encounter: Payer: Self-pay | Admitting: Primary Care

## 2024-06-28 ENCOUNTER — Ambulatory Visit: Admitting: Primary Care

## 2024-06-28 VITALS — BP 136/72 | HR 108 | Temp 97.3°F | Ht 66.0 in | Wt 155.4 lb

## 2024-06-28 DIAGNOSIS — J9611 Chronic respiratory failure with hypoxia: Secondary | ICD-10-CM | POA: Diagnosis not present

## 2024-06-28 DIAGNOSIS — J849 Interstitial pulmonary disease, unspecified: Secondary | ICD-10-CM | POA: Diagnosis not present

## 2024-06-28 DIAGNOSIS — J84112 Idiopathic pulmonary fibrosis: Secondary | ICD-10-CM

## 2024-06-28 LAB — HEPATIC FUNCTION PANEL
ALT: 6 U/L (ref 3–35)
AST: 15 U/L (ref 5–37)
Albumin: 3.7 g/dL (ref 3.5–5.2)
Alkaline Phosphatase: 66 U/L (ref 39–117)
Bilirubin, Direct: 0 mg/dL — ABNORMAL LOW (ref 0.1–0.3)
Total Bilirubin: 0.4 mg/dL (ref 0.2–1.2)
Total Protein: 7 g/dL (ref 6.0–8.3)

## 2024-06-28 NOTE — Patient Instructions (Addendum)
" °  VISIT SUMMARY: During your visit today, we reviewed your condition of interstitial lung disease and your current management plan. You reported no significant worsening of symptoms and discussed your experiences with dyspnea, cough, fatigue, and anxiety. We also reviewed your history of pneumonia and atrial fibrillation, and your current medications.  YOUR PLAN: -IDIOPATHIC PULMONARY FIBROSIS: Idiopathic pulmonary fibrosis is a condition where the lung tissue becomes scarred over time, making it difficult to breathe. You have been managing this condition with Ofev  since 2020 to slow the progression of the disease. We will continue your current dosage of Ofev  at 150 mg twice daily. We have scheduled a spirometry test to monitor your lung function and will continue to check your liver function tests every three months.  -CHRONIC HYPOXIC RESPIRATORY FAILURE: Chronic hypoxic respiratory failure is a condition where your body does not get enough oxygen . You are currently managing this with supplemental oxygen , using 4 liters at rest and increasing to 5 liters with exertion. Your goal is to keep your oxygen  saturation levels above 88-90%. We performed a sit-stand test today to assess your oxygen  levels and will continue with your current oxygen  therapy plan.  INSTRUCTIONS: Please continue taking Ofev  150 mg twice daily and using supplemental oxygen  as directed. We have scheduled a spirometry test to monitor your lung function. Additionally, we will continue to monitor your liver function tests every three months. If you experience any new or worsening symptoms, please contact our office.  Orders: PFTs- 30 min Hepatic liver function test today    Follow-up Please schedule 30 min spirometry first available, no apt needed afterwards  3 months with Dr. Levin - 30 min ILD  "

## 2024-06-28 NOTE — Progress Notes (Signed)
 "  @Patient  ID: [name], female    DOB: 1941/04/01, 84 y.o.   MRN: [id number]  Chief Complaint  Patient presents with   Follow-up    Referring provider: Okey Carlin Redbird, MD  HPI: 84 year old female, never smoked. PMH significant for HNT, pulm hypertension, diastolic heart failure, bronchiectasis, hypersensitivity pneumonitis, asthma, respiratory failure, rheumatoid arthritis,   Previous [facility] pulmonary encounter: 02/13/2024 -   Chief Complaint  Patient presents with   Follow-up    ILD and PFT f/u Pt states she loves OFEV .      #Interstitial lung disease (progressive) with chronic hypoxemic respiratory failure - chronic HP dx -Per [facility] September 2016: Disease present at least since 2008  - Surgical lung biopsy  November 20, 2014 at [place]:   - local opinion: diagnostic of usual interstitial pneumonia (UIP). Interestingly, there are scattered non-necrotizing granulomata in the pleura.  -Second opinion September 2016 at [facility]: pathology was reviewed at our multidisciplinary ILD conference and noted to have a variegated pattern of fibrosis with some airway centricity, granulomas noted both near the airway and more peripherally. Minimal inflammation was noted. Features not c/w with UIP. Few giant cells with cholesterol clefts. Overall pattern most in keeping with chronic HP   - 3rd opinon: June 2021 [facility] ILD [place] - Hypersensitivity Pneumonitis   -High-resolution CT chest including April 09, 2018: Lack of craniocaudal gradient and significant air trapping suggestive of chronic hypersensitivity pneumonitis [September 2016 [facility] evaluation thought CT was inconsistent with UIP]  - May 2021: stability since 2019 but progression sice 2016  - LAST CT Jan 2023  -Clinical diagnosis of chronic hypersensitivity pneumonitis   - dx given Sept 2016 at [facility]  -No clear-cut exposure identified at [facility] evaluation September 2016 by Dr.  Hollis  -Steroids recommended but patient preferred not to take it September 2016 at [facility]   -Positive RAST allergy  panel  (grass, dustmike) and normal IgE but elevated  blood eosinophilia in April 2023  - 500cell April 2023  - 400ccells Sept 2023   - Started Symbicort  June 2023  -Started Fasenra  January 2024. -> stopped aug 2024 due to arthraliga   -Normal right heart catheterization May 2022  -LAST ECHO Echo January 2024 with evidence of pulmonary hypertension: -  Patient held off on right heart catheterization early 2024.  -Unintentional weight loss noted in 2023  = Presumed secondary to nintedanib   #June 2025 - admit for pneumonia  -Treatment   - deferred steroids sept 2016 visit at [facility] with Dr Hollis and again at [facility] in June 2021, Apr 2022  - nintedanib  sole Rx since March 2020  -Inhaled nitric oxide  versus placebo research protocol: Withdrawal consent and follow-up March 2023  (study results launche   #Nintedanib /Ofev  requires intensive drug monitoring due to high concerns for Adverse effects of , including  Drug Induced Liver Injury, significant GI side effects that include but not limited to Diarrhea, Nausea, Vomiting,  and other system side effects that include Fatigue,  weight loss. Cardiac side effects are a black box warning as well. These will be monitored with  blood work such as LFT initially once a month for 6 months and then quarterly  HPI [name] 84 y.o. -returns for follow-up.  I saw her in July 2025.  That was in the heels of an admission in June 2025 for pneumonia.  Since then Interim Health status: No new complaints No new medical problems. No new surgeries. No ER  visits. No Urgent care visits.  The only change is that she stopped her Cardizem  which was started for atrial fibrillation.  This because she was having falls and fatigue.  Since then no more falls and fatigue has improved.  She is frustrated that her cardiology appointment is not till  October 2025 and she feels this is too long after hospital discharge.  In any event her respiratory status is improved she is on 4 L nasal cannula at rest [previously 5].  She had pulmonary function test today the FVC is improved but the DLCO is down and it is conflicting signal.  She is reluctant to have a right heart cath so on an average I would say given her clinical stability and improvement in oxygen  needs back to 4 L and her exercise hypoxemia test and her symptoms all being stable overall I would say if she is stable with her ILD.  She is taking nintedanib  and is loving it].  She still feels frail and deconditioned.  She is finished home physical therapy she has upcoming appointment with primary care and is going to see if she can do more physical therapy.  She is troubled by neck and chronic shoulder pain and is looking for some steroid injections there.  Otherwise she is stable.  Son [name] is an independent historian and he had just the same.   06/28/2024- Interim hx Discussed the use of AI scribe software for clinical note transcription with the patient, who gave verbal consent to proceed.  History of Present Illness [name] is an 84 year old female with interstitial lung disease who presents for a routine follow-up visit. She is accompanied by her son, [name].  She has a history of interstitial lung disease, specifically usual interstitial pneumonitis (UIP), diagnosed in 2008, confirmed by a surgical lung biopsy in June 2016. She has been on Ofev  since 2020 to slow disease progression. Currently, she requires 4 liters of oxygen  at rest, increasing to 5 liters with exertion.  She experiences no significant dyspnea at rest, rating it as zero. With activities like dressing or showering, she rates her dyspnea as one or two, primarily due to arthritis rather than respiratory issues. She has some difficulty with household tasks, rating her dyspnea as one or two. She no longer drives  due to physical limitations but can shop with assistance, rating her dyspnea as two or three. Walking at her own pace, she rates her dyspnea as one. She avoids stairs due to physical limitations.  She has a cough attributed to sinus drainage, rated as one. She experiences fatigue, rated as three, and occasional diarrhea, which she associates with dietary choices. She experiences anxiety due to her inability to drive and mild depression, rated as very little.  In June 2025, she was hospitalized for pneumonia and diagnosed with new atrial fibrillation. She was discharged on multiple medications, including a blood thinner, initially Eliquis , now Xarelto, and diltiazem  for her heart condition.   SYMPTOM SCALE - ILD 07/29/2019   10/03/2019   12/03/2019 217# 03/09/2020   09/24/2020 207# 08/25/2021 - withdrew from [facility] Weight 201# 09/28/2021 - 202#   11/25/2021 - 193#   02/22/2022 194# 05/17/2022   01/12/2023   04/11/2023   12/05/2023 165# 02/13/2024   162#     06/28/2024 Ofev  150mg  BID  O2 use 3L at rest, occ 4L with exertion 3L at rest and 4L with exertion. Acute bronchitis 2 weeks ago 3L rest, 4 L exertion 3-4L [place]  On ofev . Doing rehab 3/4L [place] pulse . On ofev  3L rest, 4L with eretiion. Weight 201# Treated oxygen  with rest and 4 L exertion and pulse 3.5L rest, 4-5L exertion 4L Max  4L 4 L at rest and 5 L with heavy exertion. Ofev , fassnra 4 L at rest 5 L a heavy exertion.  On nintedanib .  Off Fasenra .  Dealing with chronic pain. 5 L at rest posthospitalization for pneumonia 4L rest   ofev  4L  Shortness of Breath 0 -> 5 scale with 5 being worst (score 6 If unable to do)                             At rest 0 1 0 0 0 1 0 1 1 0  1   0 0 0  Simple tasks - showers, clothes change, eating, shaving 1 2 2 2 1 3 2 2 3 1 1  3.54 1 2 1   Household (dishes, doing bed, laundry) 2 3 2 2  2.5 3 4 3 4 3 2 4 2 3 2   Shopping 2 2 2 2  2.5 2 3 2 3 2 2  3.5 2 2  2.5- more physical limitations  Walking level at own pace 1 2 2 2  2 2 3 3 3 3 1 4 2 2 1   Walking up Stairs 6 4 4 6 5 6 6 5 6 5 5  x 6 4 X  Total (30-36) Dyspnea Score 12 14 12 14 13 17 18 16 20 14 12 15 14 13    How bad is your cough? 1 3.5   More often lateley 2 3 4 2 3 3 1  - fasneral 2.5 1 2 1   How bad is your fatigue 0 3.5 2 Better with exercise 2.5 3 4 3 3 3 1  3.5 2 3 3   How bad is nausea 0 0 0 0 0 0 0 0 0 0  0 0 0 0 0  How bad is vomiting?   0 0 0 0 0 00 0 0 0 0  0 0 0 0 0  How bad is diarrhea? 0 0 0 0 0 0 0 0 0 0  0 0 0 0 0  How bad is anxiety? 1 1.5 1 1 1  00 1 1 2 1  0 1 2 3.5 3  How bad is depression 1 1 1 1  0 0 1 1 2 1 00 1 1 1  0              SIT STAND TEST - goal 15 times     02/13/2024    06/28/2024   O2 used 4L  5L  PRobe - finter or forehead finger  Forehead   Number sit and stand completed - goal 15 15  15   Time taken to complete 54 sec    Resting Pulse Ox/HR/Dyspnea  95% and 77/min and dyspnea of 0/10   95% and HR 85  Peak measures 93 % and 99/min and dyspnea of 6/10  95% and HR 101  Final Pulse Ox/HR 94% and 95% nad 6/10  94% and HR 108  Desaturated </= 88% no  no  Desaturated <= 3% points no  no  Got Tachycardic >/= 90/min yes  yes  Miscellaneous comments Dyspnea of 6      Vitals:   06/28/24 1104 06/28/24 1203 06/28/24 1205 06/28/24 1206  BP: 136/72     Pulse: 82 85 (!) 101 (!) 108  Temp: (!) 97.3 F (  36.3 C)     Height: 5' 6 (1.676 m)     Weight: 155 lb 6.4 oz (70.5 kg)     SpO2: 96% 95% Comment: 5l o2 rest, forehead probe 95% Comment: 5l o2 cont, exertion, during sit stand 94% Comment: 5l o2 cont, resting, after sit stand  BMI (Calculated): 25.09        Allergies[1]  Immunization History  Administered Date(s) Administered   Fluad Quad(high Dose 65+) 02/07/2019, 05/06/2021, 02/22/2022   Fluzone  Influenza virus vaccine,trivalent (IIV3), split virus 02/28/2012, 03/02/2015   INFLUENZA, HIGH DOSE SEASONAL PF 04/20/2016, 02/23/2017, 02/12/2018   Influenza Split 04/20/2016, 02/12/2018   Influenza,inj,quad, With  Preservative 04/14/2014   Influenza-Unspecified 03/06/2014, 02/18/2015   PFIZER(Purple Top)SARS-COV-2 Vaccination 07/12/2019, 08/06/2019, 04/12/2020   Pneumococcal Conjugate-13 04/14/2014   Pneumococcal Polysaccharide-23 06/14/2017   Tdap 06/14/2017   Zoster, Live 04/24/2013    Past Medical History:  Diagnosis Date   Acute on chronic respiratory failure with hypoxia (HCC) 06/13/2016   Allergic rhinitis    takes Claritin  nightly   Anxiety    but not on any meds   Arthritis    Asthma    Carpal tunnel syndrome    CHF (congestive heart failure) (HCC)    patient denies-in office note 05/2015 by Madelin Elodie PIETY   GERD (gastroesophageal reflux disease)    History of bronchitis 3-14yrs ago   History of colon polyps    benign   Joint pain    Peripheral edema    takes Lasix  daily   Pneumonitis, hypersensitivity (HCC) 05/2016   chronic hypersensitivity pneumonitis- Dr. Dexter   Pulmonary hypertension (HCC) 06/30/2017   patient denies-in office note from Dr. Dexter-   Shortness of breath dyspnea    with exertion;Albuterol  inhaler as needed   Thyroid  nodule     Tobacco History: Tobacco Use History[2] Counseling given: Not Answered   Outpatient Medications Prior to Visit  Medication Sig Dispense Refill   albuterol  (PROVENTIL ) (2.5 MG/3ML) 0.083% nebulizer solution Take 2.5 mg by nebulization every 6 (six) hours as needed for wheezing or shortness of breath.     Cholecalciferol (DIALYVITE VITAMIN D 5000) 125 MCG (5000 UT) capsule Take 5,000 Units by mouth daily.     diltiazem  (CARDIZEM  CD) 120 MG 24 hr capsule Take 1 capsule (120 mg total) by mouth daily. 90 capsule 3   fluticasone  (FLONASE ) 50 MCG/ACT nasal spray Place 1 spray into both nostrils daily as needed for allergies.      furosemide  (LASIX ) 20 MG tablet Take 20 mg by mouth daily as needed for edema.     loratadine  (CLARITIN ) 5 MG chewable tablet Chew 5-10 mg by mouth daily as needed for allergies.      Multiple Vitamin  (MULTIVITAMIN WITH MINERALS) TABS tablet Take 1 tablet by mouth daily.     Nintedanib  (OFEV ) 150 MG CAPS Take 1 capsule (150 mg total) by mouth 2 (two) times daily with a meal. 180 capsule 1   OXYGEN  Inhale 4-5 L into the lungs continuous.     sodium chloride  HYPERTONIC 3 % nebulizer solution Take by nebulization in the morning and at bedtime. Dx J47.1 750 mL 1   VENTOLIN  HFA 108 (90 Base) MCG/ACT inhaler Inhale 2 puffs into the lungs every 6 (six) hours as needed for wheezing or shortness of breath. 18 g 6   vitamin B-12 (CYANOCOBALAMIN) 1000 MCG tablet Take 1,000 mcg by mouth every other day.     XARELTO 20 MG TABS tablet Take 20 mg by mouth  daily.     guaiFENesin  (ROBITUSSIN) 100 MG/5ML liquid Take 5 mLs by mouth every 4 (four) hours as needed for cough or to loosen phlegm. (Patient not taking: Reported on 06/28/2024) 118 mL 0   No facility-administered medications prior to visit.   Review of Systems  Review of Systems  Respiratory: Negative.     Physical Exam  BP 136/72 (BP Location: Left Arm, Patient Position: Sitting, Cuff Size: Large)   Pulse 82   Temp (!) 97.3 F (36.3 C)   Ht 5' 6 (1.676 m)   Wt 155 lb 6.4 oz (70.5 kg)   SpO2 96%   BMI 25.08 kg/m  Physical Exam Constitutional:      General: She is not in acute distress.    Appearance: Normal appearance. She is not ill-appearing.  Cardiovascular:     Rate and Rhythm: Normal rate and regular rhythm.  Pulmonary:     Effort: Pulmonary effort is normal.     Breath sounds: Rales present. No wheezing.     Comments: 5L poc Neurological:     Mental Status: She is alert.     Lab Results:  CBC    Component Value Date/Time   WBC 11.1 (H) 11/13/2023 0827   RBC 4.34 11/13/2023 0827   HGB 12.7 11/13/2023 0827   HCT 40.4 11/13/2023 0827   PLT 466 (H) 11/13/2023 0827   MCV 93.1 11/13/2023 0827   MCH 29.3 11/13/2023 0827   MCHC 31.4 11/13/2023 0827   RDW 14.1 11/13/2023 0827   LYMPHSABS 3.4 09/26/2022 1649   MONOABS  1.2 (H) 09/26/2022 1649   EOSABS 0.0 09/26/2022 1649   BASOSABS 0.0 09/26/2022 1649    BMET    Component Value Date/Time   NA 138 11/13/2023 0827   K 3.6 11/13/2023 0827   CL 94 (L) 11/13/2023 0827   CO2 35 (H) 11/13/2023 0827   GLUCOSE 164 (H) 11/13/2023 0827   BUN 7 (L) 11/13/2023 0827   CREATININE 0.35 (L) 11/13/2023 0827   CALCIUM 8.0 (L) 11/13/2023 0827   GFRNONAA >60 11/13/2023 0827   GFRAA >60 05/31/2018 1531    BNP    Component Value Date/Time   BNP 349.2 (H) 11/08/2023 1155    ProBNP    Component Value Date/Time   PROBNP 36.0 08/29/2023 1150    Imaging: No results found.   Assessment & Plan:   1. ILD (interstitial lung disease) (HCC) (Primary) - Hepatic function panel; Future - Pulmonary function test; Future - Hepatic function panel  2. Chronic respiratory failure with hypoxia (HCC)   Assessment and Plan Assessment & Plan Idiopathic pulmonary fibrosis Present dating back to 2008 with surgical lung biopsy confirming usual interstitial pneumonitis (UIP) in 2016. Managed with Ofev  since 2020 to slow fibrosis progression. Tolerating medication. No worsening symptoms reported. Liver function tests normal in September. Spirometry due for follow-up. - Continue Ofev  150 mg twice daily. - Scheduled spirometry. - Monitor liver function tests every three months.  Chronic hypoxic respiratory failure Managed with supplemental oxygen . Currently on 4 liters at rest, increased to 5 liters with exertion. Oxygen  saturation goal is above 88-90%. No acute exacerbations reported. - Continue supplemental oxygen  at 4-5 liters, adjusting with exertion. - Performed sit-stand test to assess oxygen  levels.  Recording duration: 19 minutes    Almarie LELON Ferrari, NP 06/30/2024     [1]  Allergies Allergen Reactions   Apixaban      Other Reaction(s): dizziness, Not available   Adhesive [Tape] Itching and Rash  Latex Itching and Swelling   Sulfadiazine     Other  Reaction(s): Not available  sulfadiazine   Doxycycline  Rash   Sulfonamide Derivatives Rash and Other (See Comments)  [2]  Social History Tobacco Use  Smoking Status Never  Smokeless Tobacco Never   "

## 2024-07-08 ENCOUNTER — Encounter (HOSPITAL_BASED_OUTPATIENT_CLINIC_OR_DEPARTMENT_OTHER)

## 2024-07-11 ENCOUNTER — Ambulatory Visit (HOSPITAL_BASED_OUTPATIENT_CLINIC_OR_DEPARTMENT_OTHER)

## 2024-07-11 DIAGNOSIS — J849 Interstitial pulmonary disease, unspecified: Secondary | ICD-10-CM

## 2024-07-11 LAB — PULMONARY FUNCTION TEST
DL/VA % pred: 96 %
DL/VA: 3.85 ml/min/mmHg/L
DLCO unc % pred: 65 %
DLCO unc: 12.64 ml/min/mmHg
FEF 25-75 Pre: 0.8 L/s
FEF2575-%Pred-Pre: 63 %
FEV1-%Pred-Pre: 54 %
FEV1-Pre: 1.06 L
FEV1FVC-%Pred-Pre: 105 %
FEV6-%Pred-Pre: 56 %
FEV6-Pre: 1.37 L
FEV6FVC-%Pred-Pre: 106 %
FVC-%Pred-Pre: 52 %
FVC-Pre: 1.37 L
Pre FEV1/FVC ratio: 77 %
Pre FEV6/FVC Ratio: 100 %

## 2024-07-11 NOTE — Progress Notes (Cosign Needed)
Spiro/DLCO performed today. 

## 2024-09-19 ENCOUNTER — Ambulatory Visit: Admitting: Internal Medicine
